# Patient Record
Sex: Male | Born: 1950 | Race: White | Hispanic: No | Marital: Married | Smoking: Former smoker
Health system: Southern US, Community
[De-identification: ages and names within clinical notes are randomized; demographics above are authoritative.]

## PROBLEM LIST (undated history)

## (undated) DIAGNOSIS — I499 Cardiac arrhythmia, unspecified: Secondary | ICD-10-CM

## (undated) DIAGNOSIS — D509 Iron deficiency anemia, unspecified: Secondary | ICD-10-CM

## (undated) DIAGNOSIS — R55 Syncope and collapse: Secondary | ICD-10-CM

## (undated) DIAGNOSIS — M1990 Unspecified osteoarthritis, unspecified site: Secondary | ICD-10-CM

## (undated) DIAGNOSIS — E119 Type 2 diabetes mellitus without complications: Secondary | ICD-10-CM

## (undated) DIAGNOSIS — I219 Acute myocardial infarction, unspecified: Secondary | ICD-10-CM

## (undated) DIAGNOSIS — K219 Gastro-esophageal reflux disease without esophagitis: Secondary | ICD-10-CM

## (undated) DIAGNOSIS — J449 Chronic obstructive pulmonary disease, unspecified: Secondary | ICD-10-CM

## (undated) DIAGNOSIS — I480 Paroxysmal atrial fibrillation: Secondary | ICD-10-CM

## (undated) DIAGNOSIS — H9190 Unspecified hearing loss, unspecified ear: Secondary | ICD-10-CM

## (undated) DIAGNOSIS — Z950 Presence of cardiac pacemaker: Secondary | ICD-10-CM

## (undated) DIAGNOSIS — F32A Depression, unspecified: Secondary | ICD-10-CM

## (undated) DIAGNOSIS — I493 Ventricular premature depolarization: Secondary | ICD-10-CM

## (undated) DIAGNOSIS — K5790 Diverticulosis of intestine, part unspecified, without perforation or abscess without bleeding: Secondary | ICD-10-CM

## (undated) DIAGNOSIS — I119 Hypertensive heart disease without heart failure: Secondary | ICD-10-CM

## (undated) DIAGNOSIS — R011 Cardiac murmur, unspecified: Secondary | ICD-10-CM

## (undated) DIAGNOSIS — I2510 Atherosclerotic heart disease of native coronary artery without angina pectoris: Secondary | ICD-10-CM

## (undated) DIAGNOSIS — T7840XA Allergy, unspecified, initial encounter: Secondary | ICD-10-CM

## (undated) DIAGNOSIS — G43909 Migraine, unspecified, not intractable, without status migrainosus: Secondary | ICD-10-CM

## (undated) DIAGNOSIS — E785 Hyperlipidemia, unspecified: Secondary | ICD-10-CM

## (undated) DIAGNOSIS — I10 Essential (primary) hypertension: Secondary | ICD-10-CM

## (undated) DIAGNOSIS — G4730 Sleep apnea, unspecified: Secondary | ICD-10-CM

## (undated) HISTORY — DX: Migraine, unspecified, not intractable, without status migrainosus: G43.909

## (undated) HISTORY — PX: SPINE SURGERY: SHX786

## (undated) HISTORY — DX: Type 2 diabetes mellitus without complications: E11.9

## (undated) HISTORY — DX: Cardiac arrhythmia, unspecified: I49.9

## (undated) HISTORY — DX: Hypertensive heart disease without heart failure: I11.9

## (undated) HISTORY — DX: Sleep apnea, unspecified: G47.30

## (undated) HISTORY — DX: Unspecified osteoarthritis, unspecified site: M19.90

## (undated) HISTORY — DX: Diverticulosis of intestine, part unspecified, without perforation or abscess without bleeding: K57.90

## (undated) HISTORY — PX: KNEE ARTHROSCOPY: SHX127

## (undated) HISTORY — DX: Atherosclerotic heart disease of native coronary artery without angina pectoris: I25.10

## (undated) HISTORY — PX: EYE SURGERY: SHX253

## (undated) HISTORY — DX: Allergy, unspecified, initial encounter: T78.40XA

## (undated) HISTORY — DX: Ventricular premature depolarization: I49.3

## (undated) HISTORY — DX: Hyperlipidemia, unspecified: E78.5

## (undated) HISTORY — DX: Depression, unspecified: F32.A

## (undated) HISTORY — DX: Chronic obstructive pulmonary disease, unspecified: J44.9

## (undated) HISTORY — PX: ABDOMINAL HYSTERECTOMY: SHX81

## (undated) HISTORY — PX: ROTATOR CUFF REPAIR: SHX139

## (undated) HISTORY — PX: LASIK: SHX215

## (undated) HISTORY — PX: WRIST ARTHROCENTESIS: SUR48

## (undated) HISTORY — PX: CORONARY ANGIOPLASTY WITH STENT PLACEMENT: SHX49

## (undated) HISTORY — DX: Essential (primary) hypertension: I10

## (undated) HISTORY — DX: Paroxysmal atrial fibrillation: I48.0

## (undated) HISTORY — DX: Iron deficiency anemia, unspecified: D50.9

## (undated) HISTORY — DX: Acute myocardial infarction, unspecified: I21.9

## (undated) HISTORY — PX: SHOULDER OPEN ROTATOR CUFF REPAIR: SHX2407

## (undated) HISTORY — DX: Gastro-esophageal reflux disease without esophagitis: K21.9

## (undated) HISTORY — PX: NECK SURGERY: SHX720

---

## 1998-06-23 ENCOUNTER — Encounter: Payer: Self-pay | Admitting: Neurosurgery

## 1998-06-25 ENCOUNTER — Encounter: Payer: Self-pay | Admitting: Neurosurgery

## 1998-06-25 ENCOUNTER — Inpatient Hospital Stay (HOSPITAL_COMMUNITY): Admission: RE | Admit: 1998-06-25 | Discharge: 1998-06-26 | Payer: Self-pay | Admitting: Neurosurgery

## 1999-06-01 HISTORY — PX: NASAL SINUS SURGERY: SHX719

## 1999-06-28 ENCOUNTER — Encounter: Payer: Self-pay | Admitting: Neurosurgery

## 1999-06-28 ENCOUNTER — Ambulatory Visit (HOSPITAL_COMMUNITY): Admission: RE | Admit: 1999-06-28 | Discharge: 1999-06-28 | Payer: Self-pay | Admitting: Neurosurgery

## 1999-12-03 ENCOUNTER — Encounter: Admission: RE | Admit: 1999-12-03 | Discharge: 2000-03-02 | Payer: Self-pay | Admitting: Family Medicine

## 2000-01-25 ENCOUNTER — Encounter: Payer: Self-pay | Admitting: Neurosurgery

## 2000-01-25 ENCOUNTER — Encounter: Admission: RE | Admit: 2000-01-25 | Discharge: 2000-01-25 | Payer: Self-pay | Admitting: Neurosurgery

## 2004-12-21 ENCOUNTER — Encounter: Admission: RE | Admit: 2004-12-21 | Discharge: 2004-12-21 | Payer: Self-pay | Admitting: *Deleted

## 2004-12-25 ENCOUNTER — Ambulatory Visit (HOSPITAL_COMMUNITY): Admission: RE | Admit: 2004-12-25 | Discharge: 2004-12-25 | Payer: Self-pay | Admitting: *Deleted

## 2006-02-28 HISTORY — PX: CARDIAC CATHETERIZATION: SHX172

## 2006-06-30 ENCOUNTER — Encounter (INDEPENDENT_AMBULATORY_CARE_PROVIDER_SITE_OTHER): Payer: Self-pay | Admitting: Specialist

## 2006-06-30 ENCOUNTER — Ambulatory Visit (HOSPITAL_BASED_OUTPATIENT_CLINIC_OR_DEPARTMENT_OTHER): Admission: RE | Admit: 2006-06-30 | Discharge: 2006-06-30 | Payer: Self-pay | Admitting: Orthopedic Surgery

## 2006-09-30 ENCOUNTER — Ambulatory Visit: Payer: Self-pay | Admitting: Pulmonary Disease

## 2006-11-11 ENCOUNTER — Ambulatory Visit (HOSPITAL_COMMUNITY): Admission: RE | Admit: 2006-11-11 | Discharge: 2006-11-11 | Payer: Self-pay | Admitting: *Deleted

## 2008-02-29 HISTORY — PX: CARDIAC CATHETERIZATION: SHX172

## 2008-03-04 ENCOUNTER — Observation Stay (HOSPITAL_COMMUNITY): Admission: EM | Admit: 2008-03-04 | Discharge: 2008-03-06 | Payer: Self-pay | Admitting: Emergency Medicine

## 2008-09-05 DIAGNOSIS — I4510 Unspecified right bundle-branch block: Secondary | ICD-10-CM

## 2008-09-05 DIAGNOSIS — I498 Other specified cardiac arrhythmias: Secondary | ICD-10-CM | POA: Insufficient documentation

## 2008-09-05 DIAGNOSIS — K219 Gastro-esophageal reflux disease without esophagitis: Secondary | ICD-10-CM | POA: Insufficient documentation

## 2008-09-05 DIAGNOSIS — Z87898 Personal history of other specified conditions: Secondary | ICD-10-CM

## 2008-09-05 DIAGNOSIS — J309 Allergic rhinitis, unspecified: Secondary | ICD-10-CM | POA: Insufficient documentation

## 2008-09-05 DIAGNOSIS — M549 Dorsalgia, unspecified: Secondary | ICD-10-CM | POA: Insufficient documentation

## 2008-09-05 DIAGNOSIS — Z8679 Personal history of other diseases of the circulatory system: Secondary | ICD-10-CM | POA: Insufficient documentation

## 2008-09-05 DIAGNOSIS — Z9861 Coronary angioplasty status: Secondary | ICD-10-CM

## 2008-09-05 DIAGNOSIS — E785 Hyperlipidemia, unspecified: Secondary | ICD-10-CM | POA: Insufficient documentation

## 2008-09-05 DIAGNOSIS — I2510 Atherosclerotic heart disease of native coronary artery without angina pectoris: Secondary | ICD-10-CM

## 2008-09-05 DIAGNOSIS — I10 Essential (primary) hypertension: Secondary | ICD-10-CM

## 2008-09-09 ENCOUNTER — Encounter: Payer: Self-pay | Admitting: Internal Medicine

## 2008-09-09 ENCOUNTER — Ambulatory Visit: Payer: Self-pay | Admitting: Internal Medicine

## 2008-09-19 ENCOUNTER — Telehealth (INDEPENDENT_AMBULATORY_CARE_PROVIDER_SITE_OTHER): Payer: Self-pay | Admitting: *Deleted

## 2008-09-28 HISTORY — PX: RADIOFREQUENCY ABLATION: SHX2290

## 2008-10-16 ENCOUNTER — Ambulatory Visit: Payer: Self-pay | Admitting: Internal Medicine

## 2008-10-16 DIAGNOSIS — I4891 Unspecified atrial fibrillation: Secondary | ICD-10-CM | POA: Insufficient documentation

## 2008-10-16 LAB — CONVERTED CEMR LAB
BUN: 15 mg/dL (ref 6–23)
Basophils Absolute: 0 10*3/uL (ref 0.0–0.1)
Basophils Relative: 0 % (ref 0.0–3.0)
CO2: 26 meq/L (ref 19–32)
Calcium: 9 mg/dL (ref 8.4–10.5)
Chloride: 105 meq/L (ref 96–112)
Creatinine, Ser: 1 mg/dL (ref 0.4–1.5)
Eosinophils Absolute: 0.1 10*3/uL (ref 0.0–0.7)
Eosinophils Relative: 1.7 % (ref 0.0–5.0)
GFR calc non Af Amer: 81.36 mL/min (ref >60)
Glucose, Bld: 85 mg/dL (ref 70–99)
HCT: 42.4 % (ref 39.0–52.0)
Hemoglobin: 14.7 g/dL (ref 13.0–17.0)
Lymphocytes Relative: 33.3 % (ref 12.0–46.0)
Lymphs Abs: 2.4 10*3/uL (ref 0.7–4.0)
MCHC: 34.7 g/dL (ref 30.0–36.0)
MCV: 86.4 fL (ref 78.0–100.0)
Monocytes Absolute: 0.5 10*3/uL (ref 0.1–1.0)
Monocytes Relative: 7.6 % (ref 3.0–12.0)
Neutro Abs: 4.1 10*3/uL (ref 1.4–7.7)
Neutrophils Relative %: 57.4 % (ref 43.0–77.0)
Platelets: 243 10*3/uL (ref 150.0–400.0)
Potassium: 4 meq/L (ref 3.5–5.1)
RBC: 4.9 M/uL (ref 4.22–5.81)
RDW: 11.9 % (ref 11.5–14.6)
Sodium: 137 meq/L (ref 135–145)
WBC: 7.1 10*3/uL (ref 4.5–10.5)

## 2008-10-17 LAB — CONVERTED CEMR LAB
INR: 2.5 — ABNORMAL HIGH (ref 0.8–1.0)
Prothrombin Time: 26 s — ABNORMAL HIGH (ref 10.9–13.3)
aPTT: 39.5 s — ABNORMAL HIGH (ref 21.7–28.8)

## 2008-10-18 ENCOUNTER — Encounter: Payer: Self-pay | Admitting: Internal Medicine

## 2008-10-18 ENCOUNTER — Telehealth: Payer: Self-pay | Admitting: Internal Medicine

## 2008-10-22 ENCOUNTER — Observation Stay (HOSPITAL_COMMUNITY): Admission: AD | Admit: 2008-10-22 | Discharge: 2008-10-23 | Payer: Self-pay | Admitting: Internal Medicine

## 2008-10-22 ENCOUNTER — Encounter (INDEPENDENT_AMBULATORY_CARE_PROVIDER_SITE_OTHER): Payer: Self-pay | Admitting: Cardiology

## 2008-10-22 ENCOUNTER — Ambulatory Visit: Payer: Self-pay | Admitting: Internal Medicine

## 2008-10-30 ENCOUNTER — Telehealth: Payer: Self-pay | Admitting: Internal Medicine

## 2009-01-22 ENCOUNTER — Ambulatory Visit: Payer: Self-pay | Admitting: Internal Medicine

## 2009-03-24 ENCOUNTER — Encounter: Admission: RE | Admit: 2009-03-24 | Discharge: 2009-03-24 | Payer: Self-pay | Admitting: Cardiology

## 2009-06-02 ENCOUNTER — Ambulatory Visit: Payer: Self-pay | Admitting: Internal Medicine

## 2009-06-02 ENCOUNTER — Encounter: Payer: Self-pay | Admitting: Internal Medicine

## 2009-06-02 DIAGNOSIS — R002 Palpitations: Secondary | ICD-10-CM | POA: Insufficient documentation

## 2009-06-04 ENCOUNTER — Ambulatory Visit: Payer: Self-pay | Admitting: Internal Medicine

## 2009-07-28 ENCOUNTER — Ambulatory Visit: Payer: Self-pay | Admitting: Internal Medicine

## 2009-08-14 ENCOUNTER — Ambulatory Visit (HOSPITAL_BASED_OUTPATIENT_CLINIC_OR_DEPARTMENT_OTHER): Admission: RE | Admit: 2009-08-14 | Discharge: 2009-08-15 | Payer: Self-pay | Admitting: Orthopedic Surgery

## 2010-01-26 ENCOUNTER — Ambulatory Visit: Payer: Self-pay | Admitting: Internal Medicine

## 2010-05-31 HISTORY — PX: CARDIAC CATHETERIZATION: SHX172

## 2010-06-04 ENCOUNTER — Inpatient Hospital Stay (HOSPITAL_COMMUNITY)
Admission: EM | Admit: 2010-06-04 | Discharge: 2010-06-09 | Payer: Self-pay | Source: Home / Self Care | Attending: Cardiovascular Disease | Admitting: Cardiovascular Disease

## 2010-06-04 LAB — BASIC METABOLIC PANEL
BUN: 11 mg/dL (ref 6–23)
CO2: 26 mEq/L (ref 19–32)
Calcium: 9.6 mg/dL (ref 8.4–10.5)
Chloride: 108 mEq/L (ref 96–112)
Creatinine, Ser: 0.93 mg/dL (ref 0.4–1.5)
GFR calc Af Amer: >60 mL/min (ref >60)
GFR calc non Af Amer: >60 mL/min (ref >60)
Glucose, Bld: 128 mg/dL — ABNORMAL HIGH (ref 70–99)
Potassium: 4.1 mEq/L (ref 3.5–5.1)
Sodium: 141 mEq/L (ref 135–145)

## 2010-06-04 LAB — DIFFERENTIAL
Basophils Absolute: 0 10*3/uL (ref 0.0–0.1)
Basophils Relative: 0 % (ref 0–1)
Eosinophils Absolute: 0 10*3/uL (ref 0.0–0.7)
Eosinophils Relative: 0 % (ref 0–5)
Lymphocytes Relative: 11 % — ABNORMAL LOW (ref 12–46)
Lymphs Abs: 1.5 10*3/uL (ref 0.7–4.0)
Monocytes Absolute: 0.7 10*3/uL (ref 0.1–1.0)
Monocytes Relative: 5 % (ref 3–12)
Neutro Abs: 11.8 10*3/uL — ABNORMAL HIGH (ref 1.7–7.7)
Neutrophils Relative %: 84 % — ABNORMAL HIGH (ref 43–77)

## 2010-06-04 LAB — POCT CARDIAC MARKERS
CKMB, poc: 1.5 ng/mL (ref 1.0–8.0)
CKMB, poc: 2.1 ng/mL (ref 1.0–8.0)
Myoglobin, poc: 68.7 ng/mL (ref 12–200)
Myoglobin, poc: 80.2 ng/mL (ref 12–200)
Troponin i, poc: <0.05 ng/mL (ref 0.00–0.09)
Troponin i, poc: <0.05 ng/mL (ref 0.00–0.09)

## 2010-06-04 LAB — CK TOTAL AND CKMB (NOT AT ARMC)
CK, MB: 3.3 ng/mL (ref 0.3–4.0)
Relative Index: INVALID (ref 0.0–2.5)
Total CK: 89 U/L (ref 7–232)

## 2010-06-04 LAB — CBC
HCT: 44.5 % (ref 39.0–52.0)
Hemoglobin: 14.7 g/dL (ref 13.0–17.0)
MCH: 29.2 pg (ref 26.0–34.0)
MCHC: 33 g/dL (ref 30.0–36.0)
MCV: 88.3 fL (ref 78.0–100.0)
Platelets: 271 10*3/uL (ref 150–400)
RBC: 5.04 MIL/uL (ref 4.22–5.81)
RDW: 12.7 % (ref 11.5–15.5)
WBC: 14 10*3/uL — ABNORMAL HIGH (ref 4.0–10.5)

## 2010-06-04 LAB — PROTIME-INR
INR: 1.06 (ref 0.00–1.49)
Prothrombin Time: 14 seconds (ref 11.6–15.2)

## 2010-06-04 LAB — TROPONIN I: Troponin I: 0.02 ng/mL (ref 0.00–0.06)

## 2010-06-05 HISTORY — PX: CARDIAC CATHETERIZATION: SHX172

## 2010-06-05 LAB — LIPID PANEL
Cholesterol: 132 mg/dL (ref 0–200)
HDL: 41 mg/dL (ref >39)
LDL Cholesterol: 83 mg/dL (ref 0–99)
Total CHOL/HDL Ratio: 3.2 RATIO
Triglycerides: 39 mg/dL (ref <150)
VLDL: 8 mg/dL (ref 0–40)

## 2010-06-05 LAB — CARDIAC PANEL(CRET KIN+CKTOT+MB+TROPI)
CK, MB: 3.3 ng/mL (ref 0.3–4.0)
CK, MB: 2.4 ng/mL (ref 0.3–4.0)
Relative Index: INVALID (ref 0.0–2.5)
Relative Index: INVALID (ref 0.0–2.5)
Total CK: 89 U/L (ref 7–232)
Total CK: 80 U/L (ref 7–232)
Troponin I: 0.02 ng/mL (ref 0.00–0.06)
Troponin I: 0.01 ng/mL (ref 0.00–0.06)

## 2010-06-05 LAB — CBC
HCT: 41.3 % (ref 39.0–52.0)
Hemoglobin: 13.6 g/dL (ref 13.0–17.0)
MCH: 29.1 pg (ref 26.0–34.0)
MCHC: 32.9 g/dL (ref 30.0–36.0)
MCV: 88.2 fL (ref 78.0–100.0)
Platelets: 207 10*3/uL (ref 150–400)
RBC: 4.68 MIL/uL (ref 4.22–5.81)
RDW: 13 % (ref 11.5–15.5)
WBC: 9.8 10*3/uL (ref 4.0–10.5)

## 2010-06-05 LAB — TSH: TSH: 0.329 u[IU]/mL — ABNORMAL LOW (ref 0.350–4.500)

## 2010-06-05 LAB — BASIC METABOLIC PANEL
BUN: 13 mg/dL (ref 6–23)
CO2: 24 mEq/L (ref 19–32)
Calcium: 8.7 mg/dL (ref 8.4–10.5)
Chloride: 106 mEq/L (ref 96–112)
Creatinine, Ser: 0.91 mg/dL (ref 0.4–1.5)
GFR calc Af Amer: >60 mL/min (ref >60)
GFR calc non Af Amer: >60 mL/min (ref >60)
Glucose, Bld: 85 mg/dL (ref 70–99)
Potassium: 3.8 mEq/L (ref 3.5–5.1)
Sodium: 139 mEq/L (ref 135–145)

## 2010-06-05 LAB — PROTIME-INR
INR: 1.11 (ref 0.00–1.49)
Prothrombin Time: 14.5 seconds (ref 11.6–15.2)

## 2010-06-05 LAB — HEMOGLOBIN A1C
Hgb A1c MFr Bld: 6.1 % — ABNORMAL HIGH (ref <5.7)
Mean Plasma Glucose: 128 mg/dL — ABNORMAL HIGH (ref <117)

## 2010-06-05 LAB — D-DIMER, QUANTITATIVE: D-Dimer, Quant: <0.22 ug/mL-FEU (ref 0.00–0.48)

## 2010-06-05 LAB — BRAIN NATRIURETIC PEPTIDE: Pro B Natriuretic peptide (BNP): 36 pg/mL (ref 0.0–100.0)

## 2010-06-05 LAB — HEPARIN LEVEL (UNFRACTIONATED): Heparin Unfractionated: <0.1 IU/mL — ABNORMAL LOW (ref 0.30–0.70)

## 2010-06-08 ENCOUNTER — Encounter: Payer: Self-pay | Admitting: Gastroenterology

## 2010-06-15 LAB — BASIC METABOLIC PANEL
BUN: 14 mg/dL (ref 6–23)
BUN: 15 mg/dL (ref 6–23)
BUN: 12 mg/dL (ref 6–23)
BUN: 15 mg/dL (ref 6–23)
CO2: 27 mEq/L (ref 19–32)
CO2: 29 mEq/L (ref 19–32)
CO2: 29 mEq/L (ref 19–32)
CO2: 27 mEq/L (ref 19–32)
Calcium: 8.5 mg/dL (ref 8.4–10.5)
Calcium: 8.9 mg/dL (ref 8.4–10.5)
Calcium: 9 mg/dL (ref 8.4–10.5)
Calcium: 8.6 mg/dL (ref 8.4–10.5)
Chloride: 104 mEq/L (ref 96–112)
Chloride: 102 mEq/L (ref 96–112)
Chloride: 104 mEq/L (ref 96–112)
Chloride: 107 mEq/L (ref 96–112)
Creatinine, Ser: 1.03 mg/dL (ref 0.4–1.5)
Creatinine, Ser: 0.95 mg/dL (ref 0.4–1.5)
Creatinine, Ser: 0.96 mg/dL (ref 0.4–1.5)
Creatinine, Ser: 0.96 mg/dL (ref 0.4–1.5)
GFR calc Af Amer: >60 mL/min (ref >60)
GFR calc Af Amer: >60 mL/min (ref >60)
GFR calc Af Amer: >60 mL/min (ref >60)
GFR calc Af Amer: >60 mL/min (ref >60)
GFR calc non Af Amer: >60 mL/min (ref >60)
GFR calc non Af Amer: >60 mL/min (ref >60)
GFR calc non Af Amer: >60 mL/min (ref >60)
GFR calc non Af Amer: >60 mL/min (ref >60)
Glucose, Bld: 87 mg/dL (ref 70–99)
Glucose, Bld: 93 mg/dL (ref 70–99)
Glucose, Bld: 93 mg/dL (ref 70–99)
Glucose, Bld: 93 mg/dL (ref 70–99)
Potassium: 3.9 mEq/L (ref 3.5–5.1)
Potassium: 3.8 mEq/L (ref 3.5–5.1)
Potassium: 3.9 mEq/L (ref 3.5–5.1)
Potassium: 4.1 mEq/L (ref 3.5–5.1)
Sodium: 138 mEq/L (ref 135–145)
Sodium: 139 mEq/L (ref 135–145)
Sodium: 139 mEq/L (ref 135–145)
Sodium: 138 mEq/L (ref 135–145)

## 2010-06-15 LAB — CBC
HCT: 34.6 % — ABNORMAL LOW (ref 39.0–52.0)
HCT: 38.2 % — ABNORMAL LOW (ref 39.0–52.0)
HCT: 37.6 % — ABNORMAL LOW (ref 39.0–52.0)
Hemoglobin: 11.3 g/dL — ABNORMAL LOW (ref 13.0–17.0)
Hemoglobin: 12.8 g/dL — ABNORMAL LOW (ref 13.0–17.0)
Hemoglobin: 12.2 g/dL — ABNORMAL LOW (ref 13.0–17.0)
MCH: 29.2 pg (ref 26.0–34.0)
MCH: 29.3 pg (ref 26.0–34.0)
MCH: 28.6 pg (ref 26.0–34.0)
MCHC: 32.7 g/dL (ref 30.0–36.0)
MCHC: 33.5 g/dL (ref 30.0–36.0)
MCHC: 32.4 g/dL (ref 30.0–36.0)
MCV: 89.4 fL (ref 78.0–100.0)
MCV: 87.4 fL (ref 78.0–100.0)
MCV: 88.1 fL (ref 78.0–100.0)
Platelets: 197 10*3/uL (ref 150–400)
Platelets: 212 10*3/uL (ref 150–400)
Platelets: 193 10*3/uL (ref 150–400)
RBC: 3.87 MIL/uL — ABNORMAL LOW (ref 4.22–5.81)
RBC: 4.37 MIL/uL (ref 4.22–5.81)
RBC: 4.27 MIL/uL (ref 4.22–5.81)
RDW: 13.1 % (ref 11.5–15.5)
RDW: 12.5 % (ref 11.5–15.5)
RDW: 12.7 % (ref 11.5–15.5)
WBC: 7.6 10*3/uL (ref 4.0–10.5)
WBC: 7.9 10*3/uL (ref 4.0–10.5)
WBC: 7.4 10*3/uL (ref 4.0–10.5)

## 2010-06-15 LAB — PROTIME-INR
INR: 1.07 (ref 0.00–1.49)
Prothrombin Time: 14.1 seconds (ref 11.6–15.2)

## 2010-06-18 NOTE — Discharge Summary (Signed)
NAME:  Lee [name], Lee [name] NO.:  [id number]  MEDICAL RECORD NO.:  [id number]          PATIENT TYPE:  INP  LOCATION:  6525                         FACILITY:  [facility]  PHYSICIAN:  Thereasa Solo. Little, M.D. DATE OF BIRTH:  04-20-1951  DATE OF ADMISSION:  06/04/2010 DATE OF DISCHARGE:  06/09/2010                              DISCHARGE SUMMARY   DISCHARGE DIAGNOSES: 1. Unstable angina, status post right coronary artery drug-eluting     stent this admission, followed by staged proximal left anterior     descending drug-eluting stent. 2. Good left ventricular function. 3. Paroxysmal atrial fibrillation with history of tachycardia on     admission without recurrence after hospitalization. 4. Treated hypertension. 5. Untreated dyslipidemia.  HOSPITAL COURSE:  The patient is a 61 year old male who is followed by Dr. Clarene Duke.  He has a history of PAF and SVT and coronary artery disease.  He had had a prior LAD stent in October 2009.  He underwent radiofrequency ablation for his atrial fibrillation in May 2010 by Dr. Johney Frame.  On the day of admission, he was sitting on a couch reading and he developed tachycardia, substernal chest pain, and shortness of breath.  He presented to the emergency room.  There is a report of tachycardia at 140, but I could not find any strips in the chart.  On admission, his EKG shows sinus tachycardia at 100.  He has a chronic right bundle.  He was admitted to telemetry and started on heparin and nitrates.  He ruled out for an MI by enzymes.  He had had a diagnostic catheterization on June 05, 2010, that showed a 90% proximal RCA.  He also had a 70% proximal LAD just after the takeoff of the first diagonal.  Previous mid LAD stent was patent.  Circumflex and left main were patent.  LV function was normal.  Renal and iliacs were normal.  He underwent staged intervention to the LAD on June 08, 2010, with a Promus stent.  We feel he can be discharged  on June 09, 2010.  He has not had recurrent tachycardia.  During this hospitalization, he did have some problems with migraines, he did actually have a migraine and was put on steroids as an outpatient prior to his admission.  These were discontinued during his hospitalization.  We did add low-dose Bystolic, the patient does have a history of erectile dysfunction.  LABORATORY DATA:  Troponins were negative.  CK-MB were negative.  BNP was 36.  Cholesterol was 132, HDL 41, and LDL 83.  Hemoglobin A1c was 6.1.  INR 1.06.  Sodium 138, potassium 4.1, BUN 15, and creatinine 0.96. White count 7.4, hemoglobin 12.2, and hematocrit 37.6.  D-dimer was less than 0.22.  Chest x-ray shows no infiltrate, congestive failure, or pneumothorax.  EKG shows sinus rhythm with small Q-wave in III and Q- wave in aVF and right bundle-branch block.  Please see med rec for complete discharge medications.  DISPOSITION:  The patient is discharged in stable condition and will follow up with Dr. Clarene Duke in a couple of weeks in the office.     Abelino Derrick, P.A.  ______________________________ Thereasa Solo Little, M.D.    Lee [name]  D:  06/09/2010  T:  06/10/2010  Job:  [id number]  cc:   Hillis Range, MD  Electronically Signed by Corine Shelter P.A. on 06/10/2010 10:10:09 AM Electronically Signed by Julieanne Manson M.D. on 06/18/2010 02:32:04 PM

## 2010-06-30 NOTE — Assessment & Plan Note (Signed)
Summary: 4 month rov/sl   Allergies: 1)  ! * Toprol   Other Orders: Holter Monitor (Holter Monitor)

## 2010-06-30 NOTE — Assessment & Plan Note (Signed)
Summary: PER CHECKOUT   Referring Provider:  Caprice Kluver, MD Primary Provider:  Erlinda Hong   History of Present Illness: The patient presents today for routine electrophysiology followup. He reports doing very well since last being seen in our clinic.  He has rare palpitations lasting only several seconds at a time.  A recent event monitor confirmed these palpitations to be PVCs and NSVT.  He has had no further afib. The patient denies symptoms of  chest pain, shortness of breath, orthopnea, PND, lower extremity edema, dizziness, presyncope, syncope, or neurologic sequela. The patient is tolerating medications without difficulties and is otherwise without complaint today.   He remains quite active.  He has been doing yard work all summer without symptoms of CP, SOB, or other.  Current Medications (verified): 1)  Micardis 40 Mg Tabs (Telmisartan) .... Take One Tablet By Mouth Daily 2)  Crestor 10 Mg Tabs (Rosuvastatin Calcium) .... Take One Tablet By Mouth Daily. 3)  Aspirin Ec 325 Mg Tbec (Aspirin) .... Take One Tablet By Mouth Daily 4)  Nasonex 50 Mcg/act Susp (Mometasone Furoate) .... 2 Sprays Each Nostril As Needed 5)  Nitroglycerin 0.4 Mg Subl (Nitroglycerin) .... One Tablet Under Tongue Every 5 Minutes As Needed For Chest Pain---May Repeat Times Three 6)  Nexium 40 Mg Cpdr (Esomeprazole Magnesium) .Marland Kitchen.. 1 By Mouth Once Daily 7)  Potassium Chloride Cr 10 Meq Cr-Caps (Potassium Chloride) .... Take One Tablet By Mouth Daily 8)  Furosemide 40 Mg Tabs (Furosemide) .... Take One Tablet By Mouth Daily.  Allergies (verified): 1)  ! * Toprol  Past History:  Past Medical History: Reviewed history from 07/28/2009 and no changes required. Paroxysmal atrial fibrillation s/p ablation PVCs CAD s/p PCI  of LAD with DES 10/09 HTN HL DJD/DDD GERD Migraines Allergic rhinitis  Past Surgical History: Reviewed history from 09/09/2008 and no changes required.  History of spine  surgery History of sinus surgery in 2001  Reconstruction of left thumb carpometacarpal joint 06/30/2006  Insertion and subsequent removal of a Medtronic Reveal recorder 2008   Darlin Priestly, M.D.   Social History: Reviewed history from 06/02/2009 and no changes required. The patient lives in [place] with his spouse. 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 He has a history of tobacco use but quit in 1989. His remote history of alcohol use and denies drug use.  Review of Systems       All systems are reviewed and negative except as listed in the HPI.   Vital Signs:  Patient profile:   60 year old male Height:      68 inches Weight:      245 pounds BMI:     37.39 Pulse rate:   86 / minute Resp:     18 per minute BP sitting:   122 / 72  (left arm)  Vitals Entered By: Marrion Coy, CNA (January 26, 2010 4:30 PM)  Physical Exam  General:  Well developed, well nourished, in no acute distress. Head:  normocephalic and atraumatic Eyes:  PERRLA/EOM intact; conjunctiva and lids normal. Mouth:  Teeth, gums and palate normal. Oral mucosa normal. Neck:  Neck supple, no JVD. No masses, thyromegaly or abnormal cervical nodes. Lungs:  Clear bilaterally to auscultation and percussion. Heart:  Non-displaced PMI, chest non-tender; regular rate and rhythm, S1, S2 without murmurs, rubs or gallops. Carotid upstroke normal, no bruit. Normal abdominal aortic size, no bruits. Femorals normal pulses, no bruits. Pedals normal  pulses. No edema, no varicosities. Abdomen:  Bowel sounds positive; abdomen soft and non-tender without masses, organomegaly, or hernias noted. No hepatosplenomegaly. Msk:  Back normal, normal gait. Muscle strength and tone normal. Pulses:  pulses normal in all 4 extremities Extremities:  No clubbing or cyanosis. Neurologic:  Alert and oriented x 3. Skin:  Intact without lesions or rashes. Psych:  Normal  affect.   EKG  Procedure date:  01/26/2010  Findings:      sinus rhythm 86 bpm, RBBB, inferior infarction  Impression & Recommendations:  Problem # 1:  ATRIAL FIBRILLATION (ICD-427.31) No further afib s/'p ablation Continue ASA 325mg  daily longterm  Problem # 2:  PALPITATIONS (ICD-785.1) due to PVCs and NSVT. Given their infrequent nature (<1 time per week, lasting 1-2 seconds), I would not recommend medical therapy at this time.  He has not tolerated beta blockers in the past. Pt should have TTE upon follow-up with Dr Clarene Duke to ensure preserved EF.  Problem # 3:  RBBB (ICD-426.4) stable  Other Orders: EKG w/ Interpretation (93000)  Patient Instructions: 1)  Your physician wants you to follow-up in: May in 2012.  You will receive a reminder letter in the mail two months in advance. If you don't receive a letter, please call our office to schedule the follow-up appointment. 2)  Your physician recommends that you continue on your current medications as directed. Please refer to the Current Medication list given to you today.

## 2010-06-30 NOTE — Assessment & Plan Note (Signed)
Summary: 6 WKS/D.MILLER   Visit Type:  Follow-up Referring Provider:  Caprice Kluver, MD Primary Provider:  Erlinda Hong   History of Present Illness: The patient presents today for routine electrophysiology followup. He reports doing very well since last being seen in our clinic.  He has rare palpitations lasting only several seconds at a time.  A recent event monitor confirmed these palpitations to be PVCs and NSVT.  He has had no further afib. The patient denies symptoms of  chest pain, shortness of breath, orthopnea, PND, lower extremity edema, dizziness, presyncope, syncope, or neurologic sequela. The patient is tolerating medications without difficulties and is otherwise without complaint today.   Current Medications (verified): 1)  Micardis 40 Mg Tabs (Telmisartan) .... Take One Tablet By Mouth Daily 2)  Crestor 10 Mg Tabs (Rosuvastatin Calcium) .... Take One Tablet By Mouth Daily. 3)  Aspirin Ec 325 Mg Tbec (Aspirin) .... Take One Tablet By Mouth Daily 4)  Fexofenadine Hcl 180 Mg Tabs (Fexofenadine Hcl) .Marland Kitchen.. 1 By Mouth Once Daily 5)  Nasonex 50 Mcg/act Susp (Mometasone Furoate) .... 2 Sprays Each Nostril As Needed 6)  Plavix 75 Mg Tabs (Clopidogrel Bisulfate) .... Take One Tablet By Mouth Daily 7)  Nitroglycerin 0.4 Mg Subl (Nitroglycerin) .... One Tablet Under Tongue Every 5 Minutes As Needed For Chest Pain---May Repeat Times Three 8)  Nexium 40 Mg Cpdr (Esomeprazole Magnesium) .Marland Kitchen.. 1 By Mouth Once Daily 9)  Potassium Chloride Cr 10 Meq Cr-Caps (Potassium Chloride) .... Take One Tablet By Mouth Daily 10)  Furosemide 40 Mg Tabs (Furosemide) .... Take One Tablet By Mouth Daily.  Allergies: 1)  ! * Toprol  Past History:  Past Medical History: Paroxysmal atrial fibrillation s/p ablation PVCs CAD s/p PCI  of LAD with DES 10/09 HTN HL DJD/DDD GERD Migraines Allergic rhinitis  Past Surgical History: Reviewed history from 09/09/2008 and no changes required.  History of spine  surgery History of sinus surgery in 2001  Reconstruction of left thumb carpometacarpal joint 06/30/2006  Insertion and subsequent removal of a Medtronic Reveal recorder 2008   Darlin Priestly, M.D.   Social History: Reviewed history from 06/02/2009 and no changes required. The patient lives in [place] with his spouse. 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 He has a history of tobacco use but quit in 1989. His remote history of alcohol use and denies drug use.  Review of Systems       All systems are reviewed and negative except as listed in the HPI.   Vital Signs:  Patient profile:   60 year old male Height:      68 inches Weight:      264 pounds BMI:     40.29 Pulse rate:   87 / minute BP sitting:   110 / 80  Vitals Entered By: Laurance Flatten CMA (July 28, 2009 11:57 AM)  Physical Exam  General:  Well developed, well nourished, in no acute distress. Head:  normocephalic and atraumatic Eyes:  PERRLA/EOM intact; conjunctiva and lids normal. Mouth:  Teeth, gums and palate normal. Oral mucosa normal. Neck:  Neck supple, no JVD. No masses, thyromegaly or abnormal cervical nodes. Lungs:  Clear bilaterally to auscultation and percussion. Heart:  Non-displaced PMI, chest non-tender; regular rate and rhythm, S1, S2 without murmurs, rubs or gallops. Carotid upstroke normal, no bruit. Normal abdominal aortic size, no bruits. Femorals normal pulses, no bruits. Pedals normal pulses. No edema, no varicosities. Abdomen:  Bowel sounds positive; abdomen soft and non-tender without masses, organomegaly, or hernias noted. No hepatosplenomegaly. Msk:  Back normal, normal gait. Muscle strength and tone normal. Pulses:  pulses normal in all 4 extremities Extremities:  No clubbing or cyanosis. Neurologic:  Alert and oriented x 3.  CNII-XII intact, strength/ sensation are intact Skin:  Intact without lesions or rashes. Cervical Nodes:  no  significant adenopathy Psych:  Normal affect.   Impression & Recommendations:  Problem # 1:  PALPITATIONS (ICD-785.1) A recent event monitor documented that his palpitations are due to PVCs and NSVT.  He has a preserved EF and his PVCs appear benign.. I have offered beta blockers, however, the patient declines beta blocker therapy at this time as he has had intolerance to toprol, atenolol, and coreg previously. NO medication changes at this time.  Problem # 2:  ATRIAL FIBRILLATION (ICD-427.31) No further afib s/'p ablation  Problem # 3:  HYPERTENSION (ICD-401.9) stable  His updated medication list for this problem includes:    Micardis 40 Mg Tabs (Telmisartan) .Marland Kitchen... Take one tablet by mouth daily    Aspirin Ec 325 Mg Tbec (Aspirin) .Marland Kitchen... Take one tablet by mouth daily    Furosemide 40 Mg Tabs (Furosemide) .Marland Kitchen... Take one tablet by mouth daily.  Patient Instructions: 1)  Your physician recommends that you schedule a follow-up appointment in: 6 months with Dr Johney Frame 2)  Continue regular follow-up with Dr Clarene Duke.

## 2010-06-30 NOTE — Assessment & Plan Note (Signed)
Summary: [facility]   Visit Type:  Follow-up Referring Provider:  Caprice Kluver, MD Primary Provider:  Erlinda Hong   History of Present Illness: The patient presents today for routine electrophysiology followup. He reports doing very well since last being seen in our clinic. He continues to have only brief (1-2) seconds of palpitations every few days.  Even though they are brief, he finds these bothersome.  He otherwise appears to be doing very well.  The patient denies symptoms of chest pain, shortness of breath, orthopnea, PND, lower extremity edema, dizziness, presyncope, syncope, or neurologic sequela. The patient is tolerating medications without difficulties and is otherwise without complaint today.   Current Medications (verified): 1)  Micardis 20 Mg Tabs (Telmisartan) .... Take One Tablet By Mouth Daily 2)  Crestor 10 Mg Tabs (Rosuvastatin Calcium) .... Take One Tablet By Mouth Daily. 3)  Aspirin Ec 325 Mg Tbec (Aspirin) .... Take One Tablet By Mouth Daily 4)  Fexofenadine Hcl 180 Mg Tabs (Fexofenadine Hcl) .Marland Kitchen.. 1 By Mouth Once Daily 5)  Nasonex 50 Mcg/act Susp (Mometasone Furoate) .... 2 Sprays Each Nostril As Needed 6)  Plavix 75 Mg Tabs (Clopidogrel Bisulfate) .... Take One Tablet By Mouth Daily 7)  Nitroglycerin 0.4 Mg Subl (Nitroglycerin) .... One Tablet Under Tongue Every 5 Minutes As Needed For Chest Pain---May Repeat Times Three 8)  Nexium 40 Mg Cpdr (Esomeprazole Magnesium) .Marland Kitchen.. 1 By Mouth Once Daily 9)  Potassium Chloride Cr 10 Meq Cr-Caps (Potassium Chloride) .... Take One Tablet By Mouth Daily 10)  Furosemide 40 Mg Tabs (Furosemide) .... Take One Tablet By Mouth Daily.  Allergies: 1)  ! * Toprol  Past History:  Past Medical History: Reviewed history from 01/22/2009 and no changes required. Paroxysmal atrial fibrillation s/p ablation SVT CAD s/p PCI  of LAD with DES 10/09 HTN HL DJD/DDD GERD Migraines Allergic rhinitis  Past Surgical  History: Reviewed history from 09/09/2008 and no changes required.  History of spine surgery History of sinus surgery in 2001  Reconstruction of left thumb carpometacarpal joint 06/30/2006  Insertion and subsequent removal of a Medtronic Reveal recorder 2008   Darlin Priestly, M.D.   Social History: The patient lives in [place] with his spouse. 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 He has a history of tobacco use but quit in 1989. His remote history of alcohol use and denies drug use.  Review of Systems       All systems are reviewed and negative except as listed in the HPI.   Vital Signs:  Patient profile:   60 year old male Height:      68 inches Weight:      262 pounds BMI:     39.98 Pulse rate:   88 / minute BP sitting:   120 / 90  (left arm)  Vitals Entered By: Laurance Flatten CMA (June 02, 2009 12:35 PM)  Physical Exam  General:  Well developed, well nourished, in no acute distress. Head:  normocephalic and atraumatic Eyes:  PERRLA/EOM intact; conjunctiva and lids normal. Nose:  no deformity, discharge, inflammation, or lesions Mouth:  Teeth, gums and palate normal. Oral mucosa normal. Neck:  Neck supple, no JVD. No masses, thyromegaly or abnormal cervical nodes. Lungs:  Clear bilaterally to auscultation and percussion. Heart:  Non-displaced PMI, chest non-tender; regular rate and rhythm, S1, S2 without murmurs, rubs or gallops. Carotid upstroke normal, no bruit. Normal abdominal aortic size, no bruits. Femorals normal  pulses, no bruits. Pedals normal pulses. No edema, no varicosities. Abdomen:  Bowel sounds positive; abdomen soft and non-tender without masses, organomegaly, or hernias noted. No hepatosplenomegaly. Msk:  Back normal, normal gait. Muscle strength and tone normal. Pulses:  pulses normal in all 4 extremities Extremities:  No clubbing or cyanosis. Neurologic:  Alert and oriented x 3.  CNII-XII  intact, strength/ sensation are intact Skin:  Intact without lesions or rashes. Cervical Nodes:  no significant adenopathy Psych:  Normal affect.   EKG  Procedure date:  06/02/2009  Findings:      sinus rhythm 88 bpm, RBBB, otherwise normal ekg  Impression & Recommendations:  Problem # 1:  PALPITATIONS (ICD-785.1) The patient reports doing well, though he continues to have very brief (1-2 seconds) of palpitations. These may represent  PACS, PVCs, or other. At this point they are concerning to him. I have therefore ordered a 21 day event monitor. I will not make any medicine changes until I know what these palpitations are, though he may benefit from nadolol (has not tolerated toprol before due to irritability).  The following medications were removed from the medication list:    Tikosyn 125 Mcg Caps (Dofetilide) .Marland Kitchen... Take 3 capsule by mouth twice a day    Warfarin Sodium 7.5 Mg Tabs (Warfarin sodium) ..... Use as directed by anticoagulation clinic His updated medication list for this problem includes:    Aspirin Ec 325 Mg Tbec (Aspirin) .Marland Kitchen... Take one tablet by mouth daily    Plavix 75 Mg Tabs (Clopidogrel bisulfate) .Marland Kitchen... Take one tablet by mouth daily    Nitroglycerin 0.4 Mg Subl (Nitroglycerin) ..... One tablet under tongue every 5 minutes as needed for chest pain---may repeat times three  Problem # 2:  ATRIAL FIBRILLATION (ICD-427.31) NO sustained afib.  Continue ASA and plavix.  The following medications were removed from the medication list:    Tikosyn 125 Mcg Caps (Dofetilide) .Marland Kitchen... Take 3 capsule by mouth twice a day    Warfarin Sodium 7.5 Mg Tabs (Warfarin sodium) ..... Use as directed by anticoagulation clinic His updated medication list for this problem includes:    Aspirin Ec 325 Mg Tbec (Aspirin) .Marland Kitchen... Take one tablet by mouth daily    Plavix 75 Mg Tabs (Clopidogrel bisulfate) .Marland Kitchen... Take one tablet by mouth daily  Orders: EKG w/ Interpretation (93000)  Problem  # 3:  CAD (ICD-414.00) stable, without symptoms of ischemia, no changes  The following medications were removed from the medication list:    Warfarin Sodium 7.5 Mg Tabs (Warfarin sodium) ..... Use as directed by anticoagulation clinic His updated medication list for this problem includes:    Aspirin Ec 325 Mg Tbec (Aspirin) .Marland Kitchen... Take one tablet by mouth daily    Plavix 75 Mg Tabs (Clopidogrel bisulfate) .Marland Kitchen... Take one tablet by mouth daily    Nitroglycerin 0.4 Mg Subl (Nitroglycerin) ..... One tablet under tongue every 5 minutes as needed for chest pain---may repeat times three  Patient Instructions: 1)  Return in 6 wks

## 2010-07-02 NOTE — Letter (Signed)
Summary: Colonoscopy Letter  [facility]  [address] [place], [place] [address]   Phone: [phone]  Fax: [phone]      June 08, 2010 MRN: [id number]   SALOME COZBY [address] [place], [place]  [address]   Dear Mr. Heinke,   According to your medical record, it is time for you to schedule a Colonoscopy. The American Cancer Society recommends this procedure as a method to detect early colon cancer. Patients with a family history of colon cancer, or a personal history of colon polyps or inflammatory bowel disease are at increased risk.  This letter has been generated based on the recommendations made at the time of your procedure. If you feel that in your particular situation this may no longer apply, please contact our office.  Please call our office at [phone] to schedule this appointment or to update your records at your earliest convenience.  Thank you for cooperating with [place] to provide you with the very best care possible.   Sincerely,  Judie Petit T. Russella Dar, M.D.  [facility] Gastroenterology Division [phone]

## 2010-07-06 ENCOUNTER — Ambulatory Visit (HOSPITAL_COMMUNITY): Payer: 59 | Attending: Cardiology

## 2010-07-06 DIAGNOSIS — I10 Essential (primary) hypertension: Secondary | ICD-10-CM | POA: Insufficient documentation

## 2010-07-06 DIAGNOSIS — Z5189 Encounter for other specified aftercare: Secondary | ICD-10-CM | POA: Insufficient documentation

## 2010-07-06 DIAGNOSIS — K219 Gastro-esophageal reflux disease without esophagitis: Secondary | ICD-10-CM | POA: Insufficient documentation

## 2010-07-06 DIAGNOSIS — Z7982 Long term (current) use of aspirin: Secondary | ICD-10-CM | POA: Insufficient documentation

## 2010-07-06 DIAGNOSIS — I498 Other specified cardiac arrhythmias: Secondary | ICD-10-CM | POA: Insufficient documentation

## 2010-07-06 DIAGNOSIS — I2510 Atherosclerotic heart disease of native coronary artery without angina pectoris: Secondary | ICD-10-CM | POA: Insufficient documentation

## 2010-07-06 DIAGNOSIS — I200 Unstable angina: Secondary | ICD-10-CM | POA: Insufficient documentation

## 2010-07-06 DIAGNOSIS — E785 Hyperlipidemia, unspecified: Secondary | ICD-10-CM | POA: Insufficient documentation

## 2010-07-06 DIAGNOSIS — E669 Obesity, unspecified: Secondary | ICD-10-CM | POA: Insufficient documentation

## 2010-07-06 DIAGNOSIS — Z79899 Other long term (current) drug therapy: Secondary | ICD-10-CM | POA: Insufficient documentation

## 2010-07-06 DIAGNOSIS — M1990 Unspecified osteoarthritis, unspecified site: Secondary | ICD-10-CM | POA: Insufficient documentation

## 2010-07-06 DIAGNOSIS — Z9861 Coronary angioplasty status: Secondary | ICD-10-CM | POA: Insufficient documentation

## 2010-07-08 ENCOUNTER — Encounter (HOSPITAL_COMMUNITY): Payer: 59 | Attending: Cardiology

## 2010-07-08 DIAGNOSIS — Z7982 Long term (current) use of aspirin: Secondary | ICD-10-CM | POA: Insufficient documentation

## 2010-07-08 DIAGNOSIS — I10 Essential (primary) hypertension: Secondary | ICD-10-CM | POA: Insufficient documentation

## 2010-07-08 DIAGNOSIS — Z9861 Coronary angioplasty status: Secondary | ICD-10-CM | POA: Insufficient documentation

## 2010-07-08 DIAGNOSIS — E669 Obesity, unspecified: Secondary | ICD-10-CM | POA: Insufficient documentation

## 2010-07-08 DIAGNOSIS — M1990 Unspecified osteoarthritis, unspecified site: Secondary | ICD-10-CM | POA: Insufficient documentation

## 2010-07-08 DIAGNOSIS — I498 Other specified cardiac arrhythmias: Secondary | ICD-10-CM | POA: Insufficient documentation

## 2010-07-08 DIAGNOSIS — I200 Unstable angina: Secondary | ICD-10-CM | POA: Insufficient documentation

## 2010-07-08 DIAGNOSIS — E785 Hyperlipidemia, unspecified: Secondary | ICD-10-CM | POA: Insufficient documentation

## 2010-07-08 DIAGNOSIS — I2510 Atherosclerotic heart disease of native coronary artery without angina pectoris: Secondary | ICD-10-CM | POA: Insufficient documentation

## 2010-07-08 DIAGNOSIS — K219 Gastro-esophageal reflux disease without esophagitis: Secondary | ICD-10-CM | POA: Insufficient documentation

## 2010-07-08 DIAGNOSIS — Z79899 Other long term (current) drug therapy: Secondary | ICD-10-CM | POA: Insufficient documentation

## 2010-07-08 DIAGNOSIS — Z5189 Encounter for other specified aftercare: Secondary | ICD-10-CM | POA: Insufficient documentation

## 2010-07-10 ENCOUNTER — Encounter (HOSPITAL_COMMUNITY): Payer: 59

## 2010-07-13 ENCOUNTER — Encounter (HOSPITAL_COMMUNITY): Payer: 59

## 2010-07-15 ENCOUNTER — Encounter (HOSPITAL_COMMUNITY): Payer: 59

## 2010-07-17 ENCOUNTER — Encounter (HOSPITAL_COMMUNITY): Payer: 59

## 2010-07-20 ENCOUNTER — Encounter (HOSPITAL_COMMUNITY): Payer: 59

## 2010-07-22 ENCOUNTER — Encounter (HOSPITAL_COMMUNITY): Payer: 59

## 2010-07-24 ENCOUNTER — Encounter (HOSPITAL_COMMUNITY): Payer: 59

## 2010-07-27 ENCOUNTER — Encounter (HOSPITAL_COMMUNITY): Payer: 59

## 2010-07-29 ENCOUNTER — Encounter (HOSPITAL_COMMUNITY): Payer: 59

## 2010-07-31 ENCOUNTER — Encounter (HOSPITAL_COMMUNITY): Payer: 59 | Attending: Cardiology

## 2010-07-31 DIAGNOSIS — Z7982 Long term (current) use of aspirin: Secondary | ICD-10-CM | POA: Insufficient documentation

## 2010-07-31 DIAGNOSIS — Z79899 Other long term (current) drug therapy: Secondary | ICD-10-CM | POA: Insufficient documentation

## 2010-07-31 DIAGNOSIS — I10 Essential (primary) hypertension: Secondary | ICD-10-CM | POA: Insufficient documentation

## 2010-07-31 DIAGNOSIS — Z9861 Coronary angioplasty status: Secondary | ICD-10-CM | POA: Insufficient documentation

## 2010-07-31 DIAGNOSIS — I498 Other specified cardiac arrhythmias: Secondary | ICD-10-CM | POA: Insufficient documentation

## 2010-07-31 DIAGNOSIS — I200 Unstable angina: Secondary | ICD-10-CM | POA: Insufficient documentation

## 2010-07-31 DIAGNOSIS — E785 Hyperlipidemia, unspecified: Secondary | ICD-10-CM | POA: Insufficient documentation

## 2010-07-31 DIAGNOSIS — K219 Gastro-esophageal reflux disease without esophagitis: Secondary | ICD-10-CM | POA: Insufficient documentation

## 2010-07-31 DIAGNOSIS — I2510 Atherosclerotic heart disease of native coronary artery without angina pectoris: Secondary | ICD-10-CM | POA: Insufficient documentation

## 2010-07-31 DIAGNOSIS — M1990 Unspecified osteoarthritis, unspecified site: Secondary | ICD-10-CM | POA: Insufficient documentation

## 2010-07-31 DIAGNOSIS — E669 Obesity, unspecified: Secondary | ICD-10-CM | POA: Insufficient documentation

## 2010-07-31 DIAGNOSIS — Z5189 Encounter for other specified aftercare: Secondary | ICD-10-CM | POA: Insufficient documentation

## 2010-08-03 ENCOUNTER — Encounter (HOSPITAL_COMMUNITY): Payer: 59

## 2010-08-05 ENCOUNTER — Encounter (HOSPITAL_COMMUNITY): Payer: 59

## 2010-08-07 ENCOUNTER — Encounter (HOSPITAL_COMMUNITY): Payer: 59

## 2010-08-10 ENCOUNTER — Encounter (HOSPITAL_COMMUNITY): Payer: 59

## 2010-08-12 ENCOUNTER — Encounter (HOSPITAL_COMMUNITY): Payer: 59

## 2010-08-14 ENCOUNTER — Encounter (HOSPITAL_COMMUNITY): Payer: 59

## 2010-08-17 ENCOUNTER — Encounter (HOSPITAL_COMMUNITY): Payer: 59

## 2010-08-19 ENCOUNTER — Encounter (HOSPITAL_COMMUNITY): Payer: 59

## 2010-08-21 ENCOUNTER — Encounter (HOSPITAL_COMMUNITY): Payer: 59

## 2010-08-23 LAB — BASIC METABOLIC PANEL
BUN: 11 mg/dL (ref 6–23)
CO2: 25 mEq/L (ref 19–32)
Calcium: 8.9 mg/dL (ref 8.4–10.5)
Chloride: 106 mEq/L (ref 96–112)
Creatinine, Ser: 0.86 mg/dL (ref 0.4–1.5)
GFR calc Af Amer: >60 mL/min (ref >60)
GFR calc non Af Amer: >60 mL/min (ref >60)
Glucose, Bld: 164 mg/dL — ABNORMAL HIGH (ref 70–99)
Potassium: 4.3 mEq/L (ref 3.5–5.1)
Sodium: 137 mEq/L (ref 135–145)

## 2010-08-23 LAB — POCT HEMOGLOBIN-HEMACUE: Hemoglobin: 15.7 g/dL (ref 13.0–17.0)

## 2010-08-24 ENCOUNTER — Encounter (HOSPITAL_COMMUNITY): Payer: 59

## 2010-08-26 ENCOUNTER — Encounter (HOSPITAL_COMMUNITY): Payer: 59

## 2010-08-27 ENCOUNTER — Encounter: Payer: Self-pay | Admitting: Gastroenterology

## 2010-08-27 ENCOUNTER — Ambulatory Visit (AMBULATORY_SURGERY_CENTER): Payer: 59 | Admitting: *Deleted

## 2010-08-27 VITALS — Ht 68.5 in | Wt 249.0 lb

## 2010-08-27 DIAGNOSIS — Z1211 Encounter for screening for malignant neoplasm of colon: Secondary | ICD-10-CM

## 2010-08-27 NOTE — Progress Notes (Signed)
Pt. On Plavix/office visit made

## 2010-08-28 ENCOUNTER — Encounter (HOSPITAL_COMMUNITY): Payer: 59

## 2010-08-31 ENCOUNTER — Encounter (HOSPITAL_COMMUNITY): Payer: Self-pay

## 2010-09-01 NOTE — Letter (Signed)
Summary: New Patient letter  [facility]  [address] [place], [place] [address]   Phone: [phone]  Fax: [phone]       08/27/2010 MRN: [id number]  Lee [name] [address] [place], [place]  [address]  [place]  Dear Lee [name] Lee [name],  Welcome to the Gastroenterology Division at [facility].    You are scheduled to see Dr.  Russella Dar on 10-07-10 at 9:30am on the 3rd floor at [facility], [address]  We ask that you try to arrive at our office 15 minutes prior to your appointment time to allow for check-in.  We would like you to complete the enclosed self-administered evaluation form prior to your visit and bring it with you on the day of your appointment.  We will review it with you.  Also, please bring a complete list of all your medications or, if you prefer, bring the medication bottles and we will list them.  Please bring your insurance card so that we may make a copy of it.  If your insurance requires a referral to see a specialist, please bring your referral form from your primary care physician.  Co-payments are due at the time of your visit and may be paid by cash, check or credit card.     Your office visit will consist of a consult with your physician (includes a physical exam), any laboratory testing he/she may order, scheduling of any necessary diagnostic testing (e.g. x-ray, ultrasound, CT-scan), and scheduling of a procedure (e.g. Endoscopy, Colonoscopy) if required.  Please allow enough time on your schedule to allow for any/all of these possibilities.    If you cannot keep your appointment, please call [phone] to cancel or reschedule prior to your appointment date.  This allows [place] the opportunity to schedule an appointment for another patient in need of care.  If you do not cancel or reschedule by 5 p.m. the business day prior to your appointment date, you will be charged a $50.00 late cancellation/no-show fee.    Thank you for  choosing [facility] for your medical needs.  We appreciate the opportunity to care for you.  Please visit [place] at our website  to learn more about our practice.                     Sincerely,                                                             The Gastroenterology Division

## 2010-09-02 ENCOUNTER — Encounter (HOSPITAL_COMMUNITY): Payer: Self-pay

## 2010-09-04 ENCOUNTER — Encounter (HOSPITAL_COMMUNITY): Payer: Self-pay

## 2010-09-07 ENCOUNTER — Encounter (HOSPITAL_COMMUNITY): Payer: Self-pay

## 2010-09-08 LAB — CBC
Hemoglobin: 12 g/dL — ABNORMAL LOW (ref 13.0–17.0)
MCHC: 33.9 g/dL (ref 30.0–36.0)
RBC: 4.08 MIL/uL — ABNORMAL LOW (ref 4.22–5.81)
WBC: 10.3 10*3/uL (ref 4.0–10.5)

## 2010-09-08 LAB — PROTIME-INR
INR: 2.7 — ABNORMAL HIGH (ref 0.00–1.49)
Prothrombin Time: 30.6 seconds — ABNORMAL HIGH (ref 11.6–15.2)

## 2010-09-08 LAB — APTT: aPTT: 41 seconds — ABNORMAL HIGH (ref 24–37)

## 2010-09-09 ENCOUNTER — Encounter (HOSPITAL_COMMUNITY): Payer: Self-pay

## 2010-09-10 ENCOUNTER — Other Ambulatory Visit: Payer: Self-pay | Admitting: Gastroenterology

## 2010-09-11 ENCOUNTER — Encounter (HOSPITAL_COMMUNITY): Payer: Self-pay

## 2010-09-14 ENCOUNTER — Encounter (HOSPITAL_COMMUNITY): Payer: Self-pay

## 2010-09-16 ENCOUNTER — Encounter (HOSPITAL_COMMUNITY): Payer: Self-pay

## 2010-09-18 ENCOUNTER — Encounter (HOSPITAL_COMMUNITY): Payer: Self-pay

## 2010-09-21 ENCOUNTER — Encounter (HOSPITAL_COMMUNITY): Payer: Self-pay

## 2010-09-23 ENCOUNTER — Encounter (HOSPITAL_COMMUNITY): Payer: Self-pay

## 2010-09-25 ENCOUNTER — Encounter (HOSPITAL_COMMUNITY): Payer: Self-pay

## 2010-09-28 ENCOUNTER — Encounter (HOSPITAL_COMMUNITY): Payer: Self-pay

## 2010-09-30 ENCOUNTER — Encounter (HOSPITAL_COMMUNITY): Payer: Self-pay

## 2010-10-02 ENCOUNTER — Encounter (HOSPITAL_COMMUNITY): Payer: Self-pay

## 2010-10-05 ENCOUNTER — Encounter (HOSPITAL_COMMUNITY): Payer: Self-pay

## 2010-10-07 ENCOUNTER — Encounter: Payer: Self-pay | Admitting: Gastroenterology

## 2010-10-07 ENCOUNTER — Ambulatory Visit (INDEPENDENT_AMBULATORY_CARE_PROVIDER_SITE_OTHER): Payer: 59 | Admitting: Gastroenterology

## 2010-10-07 ENCOUNTER — Encounter (HOSPITAL_COMMUNITY): Payer: Self-pay

## 2010-10-07 VITALS — BP 132/80 | HR 76 | Ht 68.0 in | Wt 248.8 lb

## 2010-10-07 DIAGNOSIS — K219 Gastro-esophageal reflux disease without esophagitis: Secondary | ICD-10-CM

## 2010-10-07 DIAGNOSIS — I2510 Atherosclerotic heart disease of native coronary artery without angina pectoris: Secondary | ICD-10-CM

## 2010-10-07 DIAGNOSIS — Z1211 Encounter for screening for malignant neoplasm of colon: Secondary | ICD-10-CM

## 2010-10-07 NOTE — Progress Notes (Signed)
History of Present Illness: This is a 60 year old male he returns for colorectal cancer screening and followup of GERD. He states his reflux symptoms are under very good control on anti reflux measures and pantoprazole. He underwent upper endoscopy in 2004 which was unremarkable. He underwent screening colonoscopy in January 2002 showing mild sigmoid colon diverticulosis and internal hemorrhoids. He is due for routine screening colonoscopy. He was hospitalized in January 2012 and 2 drug-eluting coronary artery stents were placed. He previously had stents placed several years prior to that. He has also undergone radiofrequency ablation for atrial fibrillation in 2010. He is maintained on Plavix and aspirin. He denies melena, hematochezia, change in bowel habits, change in stool caliber, weight loss, nausea, vomiting, dysphagia, weight loss.  Past Medical History  Diagnosis Date  . Arthritis   . GERD (gastroesophageal reflux disease)   . Hyperlipidemia   . Hypertension   . COPD (chronic obstructive pulmonary disease)   . PVC's (premature ventricular contractions)   . Cataract   . Paroxysmal atrial fibrillation   . CAD (coronary artery disease)   . DJD (degenerative joint disease)   . Allergic rhinitis   . Migraines   . Iron deficiency anemia   . Esophagitis 1991    grade 1  . Diverticulosis   . Hemorrhoids    Past Surgical History  Procedure Date  . Lasik   . Nasal sinus surgery 2001  . Coronary angioplasty with stent placement     3 stents/ 4 caths  . Knee arthroscopy     right  . Neck surgery     Cervical fusion  . Shoulder open rotator cuff repair     right  . Rotator cuff repair     left  . Wrist arthrocentesis     left  . Spine surgery   . Radiofrequency ablation May 2010    atrial fibrillation    reports that he quit smoking about 23 years ago. He has never used smokeless tobacco. He reports that he drinks alcohol. He reports that he does not use illicit drugs. family  history includes Colon cancer in his mother; Diabetes in his father; Heart disease in his father; and Uterine cancer in his mother. Allergies  Allergen Reactions  . Toprol Xl (Metoprolol Succinate) Other (See Comments)    Personality change   Outpatient Encounter Prescriptions as of 10/07/2010  Medication Sig Dispense Refill  . aspirin 325 MG EC tablet Take 325 mg by mouth daily.        Marland Kitchen BYSTOLIC 5 MG tablet Take 1 tablet by mouth daily.       Marland Kitchen CIALIS 20 MG tablet Take 1 tablet by mouth daily as needed.       . CRESTOR 10 MG tablet Take 1 tablet by mouth at bedtime.       . fluticasone (FLONASE) 50 MCG/ACT nasal spray 2 sprays by Nasal route at bedtime.       . furosemide (LASIX) 40 MG tablet Take 1 tablet by mouth daily.       Marland Kitchen HYDROcodone-acetaminophen (VICODIN) 5-500 MG per tablet Take 1 tablet by mouth as needed. Takes 1-2 for migraines      . MICARDIS 40 MG tablet Take 1 tablet by mouth daily.       . nitroGLYCERIN (NITROSTAT) 0.3 MG SL tablet Place 0.4 mg under the tongue every 5 (five) minutes as needed. Chest pain       . pantoprazole (PROTONIX) 40 MG tablet Take 1 tablet by  mouth daily.       Marland Kitchen PLAVIX 75 MG tablet Take 1 tablet by mouth daily.       . potassium chloride (MICRO-K) 10 MEQ CR capsule Take 1 capsule by mouth daily.        Review of Systems: Allergies, arthritis, headaches, hearing problems. Pertinent positive and negative review of systems were noted in the above HPI section. All other review of systems were otherwise negative.  Physical Exam: General: Well developed , well nourished, no acute distress Head: Normocephalic and atraumatic Eyes:  sclerae anicteric, EOMI Ears: Normal auditory acuity Mouth: No deformity or lesions Neck: Supple, no masses or thyromegaly Lungs: Clear throughout to auscultation Heart: Regular rate and rhythm; no murmurs, rubs or bruits Abdomen: Soft, non tender and non distended. No masses, hepatosplenomegaly or hernias noted. Normal  Bowel sounds Musculoskeletal: Symmetrical with no gross deformities  Skin: No lesions on visible extremities Pulses:  Normal pulses noted Extremities: No clubbing, cyanosis, edema or deformities noted Neurological: Alert oriented x 4, grossly nonfocal Cervical Nodes:  No significant cervical adenopathy Inguinal Nodes: No significant inguinal adenopathy Psychological:  Alert and cooperative. Normal mood and affect  Assessment and Recommendations:  1. GERD. Continue standard antireflux measures and pantoprazole 40 mg every morning. Ongoing followup with his primary physician. Endoscopy in 2004 did not reveal evidence of Barrett's or erosive esophagitis.  2. Colorectal cancer screening. He is due for screening colonoscopy however we will need to review the options for a temporary hold of Plavix with his cardiologist prior to proceeding. His cardiologist may want to wait one year from his recent stent placement for a 5-7 day hold Plavix. Obtain stool Hemoccults. We will plan to proceed with colonoscopy in January 2013 if his stool Hemoccults are negative and his cardiologist feels it would be appropriate to wait one year.  We could proceed with colonoscopy while on Plavix as long as he understands the slightly increased risk for bleeding with polypectomy and biopsy.   3. Coronary artery disease status post stent placement. See above.  4. Diverticulosis. Long-term high fiber diet with adequate daily water intake.

## 2010-10-07 NOTE — Patient Instructions (Addendum)
Follow instructions on Hemoccult cards and mail back to [place] when finished. We will contact your Cardiologist regarding your Plavix clearance. We changed your recall Colonoscopy till Jan 2013. HQ:IONGEX Little, MD

## 2010-10-09 ENCOUNTER — Encounter (HOSPITAL_COMMUNITY): Payer: Self-pay

## 2010-10-09 ENCOUNTER — Encounter: Payer: Self-pay | Admitting: Internal Medicine

## 2010-10-12 ENCOUNTER — Encounter: Payer: Self-pay | Admitting: Internal Medicine

## 2010-10-12 ENCOUNTER — Encounter (HOSPITAL_COMMUNITY): Payer: Self-pay

## 2010-10-12 ENCOUNTER — Ambulatory Visit (INDEPENDENT_AMBULATORY_CARE_PROVIDER_SITE_OTHER): Payer: 59 | Admitting: Internal Medicine

## 2010-10-12 DIAGNOSIS — I2510 Atherosclerotic heart disease of native coronary artery without angina pectoris: Secondary | ICD-10-CM

## 2010-10-12 DIAGNOSIS — I10 Essential (primary) hypertension: Secondary | ICD-10-CM

## 2010-10-12 DIAGNOSIS — I4891 Unspecified atrial fibrillation: Secondary | ICD-10-CM

## 2010-10-12 NOTE — Assessment & Plan Note (Addendum)
Doing very well s/p afib ablation, without recurrence off AAD therapy. I have reviewed ekgs from [facility] ([facility]) and also from Dr Caren Macadam office which confirm sinus tachycardia as his presenting rhythm 06/04/10. No further workup is planned at this time.  He has done well for 2 years post ablation.  If no further afib upon return in 6 months, I will begin to see him annually. He will continue to follow-up with Dr Clarene Duke.

## 2010-10-12 NOTE — Patient Instructions (Signed)
Your physician wants you to follow-up in: 6 months with Dr. Allred. You will receive a reminder letter in the mail two months in advance. If you don't receive a letter, please call our office to schedule the follow-up appointment.  

## 2010-10-12 NOTE — Assessment & Plan Note (Signed)
Stable. No changes today. 

## 2010-10-12 NOTE — Assessment & Plan Note (Signed)
Stable s/p PCI in January He will continue to follow with Dr Clarene Duke

## 2010-10-12 NOTE — Progress Notes (Signed)
The patient presents today for routine electrophysiology followup.  He reports doing well recenlty.  He developed sinus tachycardia 06/04/10 for which he was evaluated by [facility] and found to have CAD requiring PCI of the RCA and LAD.  He has done well since that time.  He has rare palpitations due to PVCs but has had no further afib since his ablation.  Today, he denies symptoms of palpitations, chest pain, shortness of breath, orthopnea, PND, lower extremity edema, dizziness, presyncope, syncope, or neurologic sequela.  The patient feels that he is tolerating medications without difficulties and is otherwise without complaint today.   Past Medical History  Diagnosis Date  . Arthritis   . GERD (gastroesophageal reflux disease)   . Hyperlipidemia   . Hypertension   . COPD (chronic obstructive pulmonary disease)   . PVC's (premature ventricular contractions)   . Cataract   . Paroxysmal atrial fibrillation     s/p afib ablation 10/22/08  . CAD (coronary artery disease)     PCI RCA and LAD 1/12  . DJD (degenerative joint disease)   . Allergic rhinitis   . Migraines   . Iron deficiency anemia   . Esophagitis 1991    grade 1  . Diverticulosis   . Hemorrhoids    Past Surgical History  Procedure Date  . Lasik   . Nasal sinus surgery 2001  . Coronary angioplasty with stent placement     3 stents/ 4 caths  . Knee arthroscopy     right  . Neck surgery     Cervical fusion  . Shoulder open rotator cuff repair     right  . Rotator cuff repair     left  . Wrist arthrocentesis     left  . Spine surgery   . Radiofrequency ablation May 2010    atrial fibrillation    Current Outpatient Prescriptions  Medication Sig Dispense Refill  . aspirin 325 MG EC tablet Take 325 mg by mouth daily.        Marland Kitchen BYSTOLIC 5 MG tablet Take 1 tablet by mouth daily.       Marland Kitchen CIALIS 20 MG tablet Take 1 tablet by mouth daily as needed.       . CRESTOR 10 MG tablet Take 1 tablet by mouth at bedtime.       .  fluticasone (FLONASE) 50 MCG/ACT nasal spray 2 sprays by Nasal route at bedtime.       . furosemide (LASIX) 40 MG tablet Take 1 tablet by mouth daily.       Marland Kitchen HYDROcodone-acetaminophen (VICODIN) 5-500 MG per tablet Take 1 tablet by mouth as needed. Takes 1-2 for migraines      . MICARDIS 40 MG tablet Take 1 tablet by mouth daily.       . nitroGLYCERIN (NITROSTAT) 0.3 MG SL tablet Place 0.4 mg under the tongue every 5 (five) minutes as needed. Chest pain       . pantoprazole (PROTONIX) 40 MG tablet Take 1 tablet by mouth daily.       Marland Kitchen PLAVIX 75 MG tablet Take 1 tablet by mouth daily.       . potassium chloride (MICRO-K) 10 MEQ CR capsule Take 1 capsule by mouth daily.         Allergies  Allergen Reactions  . Toprol Xl (Metoprolol Succinate) Other (See Comments)    Personality change    History   Social History  . Marital Status: Married    Spouse Name:  N/A    Number of Children: 2  . Years of Education: N/A   Occupational History  . retired    Social History Main Topics  . Smoking status: Former Smoker    Quit date: 06/01/1987  . Smokeless tobacco: Never Used  . Alcohol Use: Yes     once every 6-7 months  . Drug Use: No  . Sexually Active: Not on file   Other Topics Concern  . Not on file   Social History Narrative  . No narrative on file    Family History  Problem Relation Age of Onset  . Heart disease Father     and sister  . Diabetes Father   . Colon cancer Mother     mets from uterine  . Uterine cancer Mother    Physical Exam: Filed Vitals:   10/12/10 1551  BP: 110/80  Pulse: 70  Height: 5\' 8"  (1.727 m)  Weight: 250 lb (113.399 kg)    GEN- The patient is well appearing, alert and oriented x 3 today.   Head- normocephalic, atraumatic Eyes-  Sclera clear, conjunctiva pink Ears- hearing intact Oropharynx- clear Neck- supple, no JVP Lymph- no cervical lymphadenopathy Lungs- Clear to ausculation bilaterally, normal work of breathing Heart- Regular  rate and rhythm, no murmurs, rubs or gallops, PMI not laterally displaced GI- soft, NT, ND, + BS Extremities- no clubbing, cyanosis, or edema MS- no significant deformity or atrophy Skin- no rash or lesion Psych- euthymic mood, full affect Neuro- strength and sensation are intact  EKG today reveals sinus rhythm 70 bpm, PR 208, RBBB, otherwise normal ekg EKG from 06/04/10 Dr Caren Macadam office reveals sinus tachycardia 118 bpm with RBBB  Assessment and Plan:

## 2010-10-13 ENCOUNTER — Telehealth: Payer: Self-pay

## 2010-10-13 ENCOUNTER — Encounter: Payer: Self-pay | Admitting: Internal Medicine

## 2010-10-13 NOTE — Op Note (Signed)
NAME:  DAUNTAE, DERUSHA NO.:  [id number]   MEDICAL RECORD NO.:  [id number]          PATIENT TYPE:  OIB   LOCATION:  2853                         FACILITY:  [facility]   PHYSICIAN:  Darlin Priestly, MD  DATE OF BIRTH:  10/07/1950   DATE OF PROCEDURE:  11/11/2006  DATE OF DISCHARGE:                               OPERATIVE REPORT   PROCEDURE:   PROCEDURE:  Removal of a Medtronic Reveal Plus loupe recorder model  [id number], serial number [id number].   SURGEON:  Darlin Priestly, M.D.   COMPLICATIONS:  None.   INDICATIONS:  The patient is a 60 year old male patient of Dr. Caprice Kluver, Dr. Toma Aran with a history of hypertension, hyperlipidemia,  history of intermittent episodes of presyncope.  He underwent loupe  recorder implanted on December 25, 2004 which revealed intermittent episodes  of SVT, atrial fib/flutter.  The loupe recorder has now reached end of  life.  He is now referred for loupe explant.   DESCRIPTION OF OPERATION:  Informed giving informed written consent, the  patient was brought to the cardiac cath lab where left chest was shaved,  prepped and draped in sterile fashion.  Anesthesia monitoring was  established.  1% lidocaine was used to anesthetize over the previously  implanted device.  Next approximately 1.5 cm incision then carried out  over the superior aspect of the device just inferior to the previous  incision.  Hemostasis obtained with electrocautery.  Blunt dissection  was used to carry this down to the loupe capsule.  The capsule was then  opened, and the anchoring sutures identified.  Both the sutures were  clipped and the loupe recorder device was then removed.  The sutures  then removed from the base of the pocket.  The pocket was then inspected  and appeared to be free of any types of infection.  This was copiously  irrigated with 1% Kanamycin solution.  Again hemostasis confirmed.  The  subcutaneous layer was then closed with 2-0 Vicryl.  The  skin was then  closed with 4-0 Vicryl.  Steri-Strips were applied.  The patient was  then returned to recovery room in stable condition.   CONCLUSIONS:  Successful explant of a Medtronic Reveal Plus model number  [id number] loupe recorder serial number [id number].      Darlin Priestly, MD  Electronically Signed     RHM/MEDQ  D:  11/11/2006  T:  11/11/2006  Job:  [id number]   cc:   Thereasa Solo. Little, M.D.  Jari Pigg, M.D.

## 2010-10-13 NOTE — Telephone Encounter (Signed)
See 5/9 office note: If hemoccults are negative plan for 06/2011 colon recall and holding Plavix for procedure. If hemoccults are positive will need to consider colonoscopy on Plavix now.

## 2010-10-13 NOTE — Op Note (Signed)
NAME:  Lee [name], Lee [name] NO.:  [id number]   MEDICAL RECORD NO.:  [id number]          PATIENT TYPE:  INP   LOCATION:  2917                         FACILITY:  [facility]   PHYSICIAN:  Hillis Range, MD       DATE OF BIRTH:  03-01-1951   DATE OF PROCEDURE:  DATE OF DISCHARGE:                               OPERATIVE REPORT   SURGEON:  Hillis Range, MD   FIRST ASSISTANT:  Doylene Canning. Ladona Ridgel, MD   PREPROCEDURE DIAGNOSES:  1. Supraventricular tachycardia.  2. Paroxysmal atrial fibrillation.   POSTPROCEDURE DIAGNOSIS:  Paroxysmal atrial fibrillation.   PROCEDURES:  1. Comprehensive electrophysiology study.  2. Coronary sinus pacing and recording.  3. Three-dimensional mapping of supraventricular tachycardia.  4. Radiofrequency ablation of supraventricular tachycardia.  5. Isoproterenol infusion.  6. Intracardiac echocardiography.  7. Pulmonary vein venography.  8. Transseptal puncture of an intact septum.  9. Cardioversion.   INTRODUCTION:  Mr. Agcaoili is a pleasant 60 year old gentleman with a  history of supraventricular tachycardia and paroxysmal atrial  fibrillation who now presents for EP study and radiofrequency ablation.  He reports symptomatic palpitations lasting 3-4 minutes several times  per week.  He reports feeling washed out after each episode.  He was  previously evaluated at [facility] for paroxysmal atrial fibrillation and  initiated on Tikosyn.  He subsequently had an implantable loop recorder  placed, which documented a regular-appearing narrow complex tachycardia  as well as degeneration at times into an irregular tachycardia likely  consistent with atrial fibrillation.  He now presents for EP study and  radiofrequency ablation.   DESCRIPTION OF PROCEDURE:  Informed written consent was obtained and the  patient was brought to the electrophysiology lab in the fasting state.  He was adequately sedated with medications as outlined in the anesthesia  report.  The patient's right and left groins were prepped and draped in  the usual sterile fashion by the EP lab staff.  Using a percutaneous  Seldinger technique, one 6-French, one 7-French, and one 11-French  hemostasis sheaths were placed into the right common femoral vein.  The  left common femoral vein was accessed and a guidewire was advanced into  the common iliac vein; however, an 11-French hemostasis sheath could not  easily be advanced into the left common femoral vein.  Due to the  patient's anticoagulation and large body habitus, I elected to therefore  abandoned the left common femoral vein and direct manual pressure was  applied.  A 6-French decapolar Polaris X catheter was introduced through  the right common femoral vein and advanced into the coronary sinus for  recording and pacing from this location.  A 6-French quadripolar  Josephson catheter was introduced through the right common femoral vein  and advanced into the right ventricle for recording and pacing.  This  catheter was then pulled back to the His bundle location.  The patient  presented to the electrophysiology lab in normal sinus Lee [name] with a  right bundle-branch QRS morphology.  The RR interval measured 800  milliseconds.  The PR interval measured 187 milliseconds with an QRS  duration of 152  milliseconds and a QT interval of 448 milliseconds.  The  AH interval measured 114 milliseconds with an HV interval of 55  milliseconds.  Ventricular pacing was performed which revealed midline  decremental VA conduction with a VA Wenckebach cycle length of 360  milliseconds with no tachycardias induced.  Programmed extrastimulus  testing was performed from the right ventricular apex position, which  revealed midline decremental VA conduction with a ventricular ERP of  500/370 milliseconds.  Rapid atrial pacing was performed, which revealed  PR equal to RR with an AV Wenckebach cycle length of 370 milliseconds.   Tachycardia was not induced.  Atrial extrastimulus testing was  performed, which revealed multiple AH jumps with single echo beats, but  no tachycardias induced.  The AV nodal ERP was 500/290 milliseconds.  Rapid atrial pacing was again performed, which revealed PR equal to RR,  but no inducible AV nodal reentrant tachycardia despite multiple  efforts.  Isoproterenol was then infused at 2 mcg per minute and rapid  atrial pacing was again performed, which revealed no evidence of PR  greater than RR with an AV Wenckebach cycle length of 290 milliseconds.  Isoproterenol was decreased to 1 mcg per minute and atrial extrastimulus  testing was performed, which revealed an AH jump and single echo beat,  but no tachycardias induced.  The AV nodal ERP was 450/170 milliseconds.  Again, rapid atrial pacing was performed down to a cycle length of 200  milliseconds during isoproterenol infusion at 1 mcg per minute with no  tachycardias induced.  Atrial extrastimulus testing was again performed  down to 400/300/200 with no tachycardias induced.  Isoproterenol was  discontinued and allowed to wash out.  Rapid atrial pacing was again  performed down to the cycle length of 190 milliseconds, at which time,  atrial fibrillation was induced.  Attention was then turned to atrial  fibrillation ablation.  The His catheter was removed and in its place a  Delphi intracardiac echocardiography catheter was  advanced into the right atrium.  Intracardiac echocardiography was  performed of the interatrial septum and left atrium.  This demonstrated  a normal-sized left atrium with very large left inferior and right  inferior pulmonary veins.  The left superior and right superior  pulmonary veins appeared to be normal in size.  The middle right common  femoral vein sheath was exchanged for an 8.5 [place] SL2 transseptal  sheath and transseptal access was achieved in a standard fashion using a   Brockenbrough needle under biplane fluoroscopy with intracardiac  echocardiography and visualization of the transseptal puncture.  Once  transseptal access had been achieved, heparin was administered  intravenously and intraarterially in order to maintain an ACT of greater  than 400 seconds throughout the procedure.  The intracardiac  echocardiography catheter was removed.  A 6-French multipurpose  angiographic catheter with guidewire was introduced through the  transseptal sheath and positioned over the mouth of all four pulmonary  veins.  Pulmonary venograms were performed by hand injection of nonionic  contrast and demonstrated moderate-sized left superior and left inferior  pulmonary veins (20-25 mm in size) with no evidence of pulmonary vein  stenosis.  The right inferior and left inferior pulmonary veins were  noted to be very large in size measuring greater than 25 mm.  The  angiographic catheter was then removed.  The transseptal sheath was  pulled back into the IVC over a guidewire.  A 3.5 mm Biosense Webster EZ  [employer] ThermoCool ablation catheter  was introduced through the right  common femoral vein and advanced into the right atrium.  The ablation  catheter was advanced across the transseptal hole using the wire as a  guide.  The transseptal sheath was then re-advanced over the guidewire  into the left atrium.  A 25 mm 20-pole circular mapping catheter was  introduced through the transseptal sheath and positioned over the mouth  of all four pulmonary veins.  Three-dimensional electroanatomical  mapping was performed using [employer].  The patient then  underwent successful sequential electrical isolation and anatomical  encircling of all four pulmonary veins using radiofrequency current with  the circular mapping catheter as a guide.  The patient was then  successfully cardioverted to sinus Lee [name] with a single synchronized 200-  joule biphasic shock with  cardioversion electrodes in the anterior-  posterior thoracic configuration.  Interestingly during atrial  fibrillation, the patient's RR interval was noted to be quite regular at  times; however, his atrial activations along the coronary sinus catheter  were clearly atrial fibrillation with no evidence of a regular rise  tachycardia.  No atrial flutters or atrial tachycardias were observed.  The patient remained in sinus Lee [name] for the duration of the study.  Pacing was then performed from the left atrium and the circular mapping  catheter was positioned over the mouth of all four pulmonary veins to  confirm complete electrical isolation of all four pulmonary veins.  The  procedure was therefore considered completed.  All catheters were  removed and the sheaths were aspirated and flushed.  The intracardiac  echocardiography catheter was again advanced into the right atrium and  no pericardial effusion was observed.  This catheter was then removed.  The patient was transferred to the recovery area for sheath removal per  protocol.  A limited bedside transthoracic echocardiogram revealed no  pericardial effusion.  There were no early apparent complications.   CONCLUSIONS:  1. Sinus Lee [name] upon presentation.  2. No inducible atrial tachycardias, atrial flutters, or AV nodal      reentrant tachycardia.  3. No evidence of accessory pathways.  4. The patient did have dual AV nodal physiology, but only single echo      beats with no AVNRT induced.  I therefore did not ablate the      patient's slow pathway.  5. Inducible atrial fibrillation.  6. Successful electrical isolation and anatomical encircling of all      four pulmonary veins using radiofrequency current.  7. Successful cardioversion to sinus Lee [name].  8. No early apparent complications.      Hillis Range, MD  Electronically Signed     JA/MEDQ  D:  10/22/2008  T:  10/23/2008  Job:  [id number]   cc:   Thereasa Solo. Little, M.D.

## 2010-10-13 NOTE — Telephone Encounter (Signed)
Notified patient that I will call him after we receive results from Hemoccult cards and Dr. Ardell Isaacs plan if they are positive for blood. Pt agreed and verbalized understanding.

## 2010-10-13 NOTE — Cardiovascular Report (Signed)
NAME:  Lee [name], Lee [name] NO.:  [id number]   MEDICAL RECORD NO.:  [id number]          PATIENT TYPE:  INP   LOCATION:  6526                         FACILITY:  [facility]   PHYSICIAN:  Thereasa Solo. Little, M.D. DATE OF BIRTH:  26-May-1951   DATE OF PROCEDURE:  03/05/2008  DATE OF DISCHARGE:                            CARDIAC CATHETERIZATION   INDICATIONS FOR TEST:  This 60 year old male had a cath several years  ago showing only mild coronary artery disease.  He presented to the  emergency room on the night of March 04, 2008, with complaints of chest  discomfort and shortness of breath that had been going on about 72  hours.  His EKG shows a right bundle-branch block.  His cardiac markers  were unremarkable, but he has multiple risk factors for coronary artery  disease and because of this he is brought to the lab for cardiac  catheterization.   After obtaining informed consent, the patient was prepped and draped in  the usual sterile fashion exposing the right groin.  Following local  anesthetic with 1% Xylocaine, the Seldinger technique was employed and a  5-French introducer sheath was placed in the right femoral artery.  Left  and right coronary arteriography was performed.  A ventriculogram was  performed at the end of the procedure and a percutaneous coronary  intervention to his LAD was performed.   COMPLICATIONS:  None.   TOTAL CONTRAST:  255 mL.   MEDICATIONS:  The patient was given 600 mg of oral Plavix, Angiomax IV,  and 20 mg of IV Plavix.   RESULTS:  1. Hemodynamic monitoring:  Central aortic pressure was 129/78.  Left      ventricular pressure was 129/5 with no aortic valve gradient at the      time of pullback.  2. Ventriculography:  Ventriculography in the RAO projection using 25      mL of contrast at 12 mL per second showed normal LV systolic      function.  The ejection fraction was in excess of 60%.  The end-      diastolic pressure was 17.  There  was marked ventricular ectopy      during the ventriculogram.   CORONARY ARTERIOGRAPHY:  1. Left main normal.  It bifurcated.  2. Circumflex.  The circumflex was a large vessel that bifurcated.  At      the bifurcation to the most superior limb of the bifurcation, there      was a 40% ostial narrowing.  The remainder of the circumflex system      was free of disease.  3. LAD.  There was a proximal tubular 70% area of narrowing in the      LAD, it was about 15-20 mm in length.  In the midportion of the      LAD, there was a focal 80% plus area of narrowing.  The distal LAD      had minimal irregularities and it wrapped around the apex of the      heart.  The first diagonal was a medium-sized vessel with no  significant disease.  4. Right coronary artery.  The right coronary artery had proximal      irregularities.  There were eccentric and the most significant area      was about 40%.  The PDA and posterior lateral vessels were both      free of disease.   Percutaneous intervention was then undertaken using IV Angiomax.  The  system was upgraded to a 6-French system.  A JL-4 guide catheter was  cannulated the ostium the best, a 3.5 gave very poor backup.  A short  Luge wire was placed down the distal LAD as it wrapped around the apex  of the heart.  Primary stenting to the LAD with a 2.5 x 12 endeavor  stent was performed.  It was initially deployed at 14 atmospheres for 40  seconds.  The final inflation was 10 atmospheres for 30 seconds.   Post-dilatation was performed using a 2.75 x 9 Keene sprinter, 10  atmospheres for 40 seconds and then 9 atmospheres for 35 seconds.  This  resulted in the 80% area of narrowing pre-intervention, now appearing to  be normal.  There was no evidence of the dissection or thrombus and  there was TIMI III flow pre and post.   With deflating the post-dilatation balloon to 10 atmospheres, the vessel  obtained a 2.77 diameter.  The sheaths were removed  later today.  He  will probably be ready for discharge in the morning with followup in my  office.  He will need Plavix indefinitely.           ______________________________  Thereasa Solo Little, M.D.     ABL/MEDQ  D:  03/05/2008  T:  03/06/2008  Job:  [id number]   cc:   [facility]  Cardiac Cath Lab

## 2010-10-13 NOTE — Discharge Summary (Signed)
NAME:  Lee [name], Lee [name] NO.:  [id number]   MEDICAL RECORD NO.:  [id number]          PATIENT TYPE:  INP   LOCATION:  6526                         FACILITY:  [facility]   PHYSICIAN:  Thereasa Solo. Little, M.D. DATE OF BIRTH:  01/01/1951   DATE OF ADMISSION:  03/04/2008  DATE OF DISCHARGE:  03/06/2008                               DISCHARGE SUMMARY   DISCHARGE DIAGNOSES:  1. Unstable angina, resolved.      a.     Negative myocardial infarction.  2. Coronary artery disease, which is new, from mid left anterior      descending stenosis of 80%, undergoing percutaneous transluminal      coronary angioplasty stent deployment with Endeavor stent.  3. Supraventricular tachycardia history and prior to admission he felt      he was having increased episodes, none documented during      hospitalization, questionably related to ischemia as an outpatient.  4. Right bundle-branch block that was new in August 2009, is resolved      postprocedure.  5. Gastroesophageal reflux disease.  6. Hypertension.  7. Dyslipidemia.  8. History of migraines with difficulty taking Topamax with stomach      upset.  9. Previously he had noncritical coronary disease and cath in 2007.  10.History of back pain.   DISCHARGE CONDITION:  Improved.   PROCEDURES:  On March 05, 2008, combined left heart cath by Dr. Julieanne Manson.   On March 05, 2008, PTCA and stent deployment with an Endeavor 2.5 mm x  12 mm mid LAD stent secondary to 80% stenosis.  Continued disease of  proximal 40% RCA stenosis and circ had a 40% in ongoing OM.   DISCHARGE MEDICATIONS:  1. Tikosyn 375 mg 1 twice a day.  2. Cartia XT 360 mg daily.  3. Micardis 20 mg daily.  4. Lasix 20 mg daily.  5. Crestor 10 mg daily.  6. Protonix 40 mg daily which was actually stopped and he was      discharged with Zantac 150 mg twice a day with the hope it will      control of reflux issues, if not then Dr. Clarene Duke will readdress       Protonix.  7. Aspirin 325 mg daily.  8. Fexofenadine 180 mg daily.  9. Nasonex nasal spray 2 sprays each nostril as needed.  10.Topamax 25 mg, he should be taking daily, but he will take it for a      few weeks and then has to stop because of stomach irritation and      then he resumes.  11.Baclofen 10 mg as needed.  12.Diclofenac sodium as needed.  13.Plavix 75 mg 1 daily, do not stop it because it could cause heart      attack.  14.Nitroglycerin 1:150 under tongue while sitting as needed for chest      pain as per cardiac rehab instructions.  15.Fioricet 1-2 every 4 to 6 hours as needed for headache until he see      Dr. Sharene Skeans.   DISCHARGE INSTRUCTIONS:  1. Low-sodium, heart-healthy diet.  2. Do not  return to work until March 11, 2008, and then only work 40      hours per week until you see Dr. Clarene Duke.  3. Wash cath site with soap and water.  Call if any bleeding,      swelling, or drainage.  4. Increase activity slowly.  May shower.  No lifting for 2 days.  No      driving for 2 days.  5. Follow up with Dr. Clarene Duke on April 03, 2008, at 11:20 a.m.  6. Watch diet closely, decrease fat. Please note, the patient is on      Crestor 10, but at higher doses of statins he has developed      significant myalgias.  7. See Dr. Sharene Skeans to adjust migraine medications.  Fioricet is given      and you can try until you see her back for migraine.  Stop taking      Protonix for now.   HISTORY OF PRESENT ILLNESS:  A 60 year old white married male with a  history of SVT, on Tikosyn and new right bundle-branch block as of  August 2009.  His SVT is followed at [facility].  He had not had any episodes  of SVT recently and then over the last day or so prior to admission he  was having chest discomfort, pressure and tachycardia episodically.  The  patient called our office and was sent to the emergency room.  He  described the pain as sharp in the left breast area, comes with rest and  exertion and had  not awakened him from sleep.  They last 20-13 minutes  and then resolve.  No associated symptoms of nausea, vomiting,  diaphoresis, or shortness of breath.  He also complained of increasing  episodes of tachycardia with dizziness which is what is felt like prior  to starting to Tikosyn.  He had not had any syncope.  In the ER, he did  have some mild chest discomfort.  EKG revealed right bundle-branch  block, but no acute changes.  The right bundle was seen on EKG in August  2009.  Previously, it had incomplete bundle, but now with the right  bundle.   The patient was admitted and placed on IV heparin and nitroglycerin, and  plans were for cardiac catheterization.   PAST MEDICAL HISTORY:  The patient underwent cardiac cath in October  2007.  EF was normal at 60%.  No MR.  Mild noncritical disease, 20% of  the RCA.  Left circumflex with focal 90% stenosis of the distal branch  and 50% mid LAD disease.  He has had a history of SVT, initially was  diagnosed secondary to syncope, but had not had any true episodes on the  Tikosyn.  He also has a history of migraines, dyslipidemia, back pain,  hypertension, and reflux disease.   ALLERGIES:  BETA-BLOCKERS and LISINOPRIL causes called.  HIGHER DOSES OF  STATINS cause myalgias.  He tolerates Crestor 10, but does not tolerate  CRESTOR 20 MG.   FAMILY HISTORY, SOCIAL HISTORY, AND REVIEW OF SYSTEMS:  See H and P.   PHYSICAL EXAMINATION ON DISCHARGE:  VITAL SIGNS:  Blood pressure 118/61,  pulse 81, respirations 20, temperature 97.8, and oxygen saturation room  air was 95%.  GENERAL:  Alert and oriented white male in no acute distress.  SKIN:  Warm and dry.  No complaints.  HEART:  Regular rate and rhythm without murmur.  LUNGS:  Clear without rales, rhonchi, or wheezes.  EXTREMITIES:  Right groin cath site had minimal bruising.  No hematoma.   LABORATORY DATA:  On admission, hemoglobin 14.2, hematocrit 42.3, WBC  6.2, platelets 239, and MCV  88.5.  At discharge, hemoglobin 12.9 and  hematocrit 38.9.   Chemistry:  Sodium 141, potassium 4.1, chloride 108, CO2 of 26, BUN 11,  creatinine 0.82, and glucose 89, no acute changes on discharge.  Admission protime 13.3, INR 1, and PTT 27 and with heparin he was  therapeutic.  On GI, AST 26, ALT 25, alkaline phos 65, total bili 0.4,  and albumin 3.9.   Cardiac markers were all negative with CKs 163, 139, 120, and 71; MBs  4.4, 3.5, 3.1, and 2.0; and troponin I is negative at 0.01-0.03 and  postprocedure they were negative as well.  Calcium was 9.2, magnesium  2.0, and TSH was 1.269.   Chest x-ray on admission, no acute cardiopulmonary disease.   HOSPITAL COURSE:  The patient was admitted by Dr. Mariah Milling on call for Dr.  Clarene Duke secondary to unstable angina and outpatient runs of presumed SVT  per patient's description and he was started on IV heparin and  nitroglycerin and the next morning, he was seen by Dr. Clarene Duke and plans  were for cardiac catheterization.   The patient underwent cardiac cath and was found to have 80% mid LAD  stenosis and underwent PTCA and stent deployment with an Endeavor stent.  The patient tolerated the procedure well.   Postprocedure EKGs initially continue with right bundle-branch block,  but by the morning of March 06, 2008, bundle branch-block had resolved.   On March 06, 2008, the patient ambulated without problems.  No SVT had  been seen during hospitalization.  He had no complaints and he was ready  for discharge home.  Please note, the patient will not return to work  until March 11, 2008, and then only 40 hours a week. He has been  working 50-60 hours a week, which he feels added to his stress.  We will  leave him at 40 hours a week and then he can re-discuss that with Dr.  Clarene Duke when he is seen in November 2009.  He will follow up at [facility].  He  is instructed to follow up with Dr. Sharene Skeans for his migraine and Dr.  Tanya Nones for general  care.      Darcella Gasman. Ingold, N.P.    ______________________________  Thereasa Solo Little, M.D.    LRI/MEDQ  D:  03/06/2008  T:  03/07/2008  Job:  [id number]   cc:   Thereasa Solo. Little, M.D.  Lidia Collum, MD  Erma Pinto, MD  Priscille Heidelberg. Pamalee Leyden, MD

## 2010-10-13 NOTE — Discharge Summary (Signed)
NAME:  Lee [name], Lee [name] NO.:  [id number]   MEDICAL RECORD NO.:  [id number]          PATIENT TYPE:  INP   LOCATION:  2917                         FACILITY:  [facility]   PHYSICIAN:  Hillis Range, MD       DATE OF BIRTH:  30-Nov-1950   DATE OF ADMISSION:  10/22/2008  DATE OF DISCHARGE:  10/23/2008                               DISCHARGE SUMMARY   CARDIOLOGIST:  Gaspar Garbe B. Little, MD   PRIMARY CARE PHYSICIAN:  Broadus John T. Pamalee Leyden, MD   ALLERGIES:  This patient has allergy to TOPROL.   TIME FOR THIS DICTATION AND EXPLANATION TO THE PATIENT:  Greater than 40  minutes.   FINAL DIAGNOSES:  1. Paroxysmal atrial fibrillation  2. No inducible SVT on EP study  3. Possible obstructive sleep apnea.   SECONDARY DIAGNOSES:  1. History of supraventricular tachycardia.  2. Coronary artery disease status post drug-eluting stent, October      2009.  3. Hypertension.  4. Dyslipidemia.  5. Degenerative joint disease.  6. Gastroesophageal reflux disease.  7. Migraines.   PROCEDURE:  Oct 22, 2008 electrophysiology study with finding of  inducible afib, without inducible SVT.  NO evidence of accessory  pathways were observed.  Though the patient had dual atrioventricular  nodal physiology, no inducible atrioventricular nodal reentrant  tachycardia was observed.  His clinical history is most consistent with  symptoms of afib refractory to medical therapy.  The patient therefore  underwent anatomical encirclement and electrical isolation of all four  pulmonary veins.  No inducible arrhythmias following ablation.   BRIEF HISTORY:  Lee [name] is a 60 year old male.  He has a history  of supraventricular tachycardia and paroxysmal atrial fibrillation.  His  past medical history also includes hypertension and coronary artery  disease.  He is status post drug-eluting stent to the LAD, October 2009.   Now presents to office visit on Oct 16, 2008 for discussion  electrophysiology  therapies for his paroxysmal atrial fibrillation.  He  continues to have episodic palpitations.  These happened several times a  week.  They last 3-4 minutes.  He does not know of any precipitating  factors or triggers.  He feels washed out following an episode.  He  denies recent chest pain, orthopnea, paroxysmal nocturnal dyspnea,  presyncope, or syncope.  He is on Coumadin and has had no difficulty  with bleeding.  He has failed prior medical therapy with Tikosyn and diltiazem.  He is  presently anticoagulated with Coumadin.  The patient has been evaluated  in the past at [facility] and has been treated there  with dofetilide and diltiazem for afib.   HOSPITAL COURSE:  The patient presents electively on Oct 22, 2008.  He  underwent electrophysiology study with inducible afib (as above).  He  underwent successful anatomical encirclement and electrical isolation of  all four pulmonary veins.  He has had no apparent complications in the  postprocedure period and he is maintaining sinus rhythm.  He required a  nasal trumpet during the procedure and postprocedure day #1 managed to  express a large clot  from the right naris which then started to bleed.  He has been applying pressure to this and it has been abating at the  time of his discharge, although not completely stopped.   DISCHARGE MEDICATIONS:  Lee [name] discharges on the following  medications:  1. Tikosyn 125 mcg 3 tablets in the morning and 3 tablets in the      evening.  2. Nasonex 50 mg as needed.  3. Cardizem LA 360 mg daily.  He is to continue this for the next 2      weeks and to stop it if he has had no recurrence of tachy      arrhythmias.  4. Micardis 20 mg tablets 1/2 tablet daily.  5. Furosemide 20 mg daily.  6. Crestor 10 mg daily at bedtime.  7. Enteric-coated aspirin 81 mg daily.  8. Fexofenadine 180 mg daily.  9. Diclofenac 75 mg daily.  10.Plavix 75 mg daily.  11.Nexium 40 mg  daily.  12.Potassium 10 mEq daily.   The patient also takes Coumadin 5 mg tablets 1-1/2 tablets daily except  for 2 tablets Monday and Friday.   FOLLOWUP:  He follows up with Dr. Johney Frame on Wednesday, January 22, 2009  at 1:45 p.m.  At that time, he would probably be able to discontinue  Tikosyn and Coumadin.   LABORATORY DATA THIS ADMISSION:  Complete blood count on the day of  discharge:  White cells 10.3, hemoglobin 12, hematocrit 35.5, and  platelets of 202.  Protime on the day of discharge 30.6 and INR 2.7.  Basic metabolic panel was taken in preparation for this procedure on Oct 16, 2008:  Sodium 137, potassium is 4, chloride 105, carbonate 26,  creatinine is 1.0, BUN is 15, and glucose 85.      Maple Mirza, [place]      Hillis Range, MD  Electronically Signed    GM/MEDQ  D:  10/23/2008  T:  10/24/2008  Job:  [id number]   cc:   Thereasa Solo. Little, M.D.  Broadus John T. Pamalee Leyden, MD

## 2010-10-13 NOTE — Telephone Encounter (Signed)
The letter is in your folder to be signed but I wanted to go ahead and inform the patient and you of Dr. Fredirick Maudlin answer. Did you want patient to wait til January for procedure when the new recall will be sent out or now and stay on Plavix?

## 2010-10-14 ENCOUNTER — Encounter (HOSPITAL_COMMUNITY): Payer: Self-pay

## 2010-10-16 ENCOUNTER — Encounter (HOSPITAL_COMMUNITY): Payer: Self-pay

## 2010-10-16 NOTE — Op Note (Signed)
NAME:  Lee [name], Lee [name] NO.:  [id number]   MEDICAL RECORD NO.:  [id number]          PATIENT TYPE:  AMB   LOCATION:  DSC                          FACILITY:  [facility]   PHYSICIAN:  Katy Fitch. Sypher, M.D. DATE OF BIRTH:  04-15-1951   DATE OF PROCEDURE:  06/30/2006  DATE OF DISCHARGE:                               OPERATIVE REPORT   PREOPERATIVE DIAGNOSIS:  Painful left thumb carpometacarpal joint  arthrosis.   POSTOPERATIVE DIAGNOSIS:  Painful left thumb carpometacarpal joint  arthrosis.   OPERATIONS:  Reconstruction of left thumb carpometacarpal joint  utilizing an autogenous palmaris longus tendon to create an  intermetacarpal ligament and suspension plasty utilizing an Arthrex  TightRope Construct with #2 FiberWire suture.   OPERATIONS:  Lee [name], M.D.   ASSISTANT:  Molly Maduro Dasnoit PA-C.   ANESTHESIA:  Infraclavicular block of left upper extremity, supplemented  by IV sedation, supervising anesthesiologist is Dr. Autumn Patty.   INDICATIONS:  Lee [name] is a 60 year old gentleman who is well  acquainted with our practice.  He has been known to have bilateral thumb  CMC arthritis.  He has reached disabling pain levels on the left.   We have had a series of lengthy informed consent visits in which we  described the various techniques of reconstructing the thumb.   We have advised them to proceed with trapeziectomy and a suspension  plasty type procedure utilizing an autogenous palmaris longus tendon  graft to create an intermetacarpal ligament with bone-tunnel technique.  We have advised him that in our judgment preferred to use a TightRope  suture construct to maintain the proper position of his carpometacarpal  joint rather than utilizing Kirschner wires as was the case in prior  arc.   Since the introduction of the TightRope, we have had excellent outcomes  avoiding the use of Kirschner wires, thereby minimizing the risks of pin-  track  infection and/or pin complications in the postoperative period.   After informed consent, Lee [name] was brought to the operating room  at this time.   Preoperatively, he was advised of the potential risks and benefits of  surgery.  Our goal was to relieve his pain and improve his pinch  function.  The risks include infection, failure of the hardware, failure  to relieve all of his pain and the possible onset of regional pain  following a thumb procedure.   Questions have been invited and answered preoperatively.   PROCEDURE:  Lee [name] is brought to the operating room and placed  in supine position on the operating room table.  Following an anesthesia  consult by Dr. Sampson Goon, an infraclavicular block was placed without  difficulty.  Complete anesthesia of left upper extremity was achieved.   Lee [name] was brought to the operating room, placed in supine  position on the operating room table, and 1 gram of Ancef administered  as an IV prophylactic antibiotic.   Lee [name] left arm and hand were prepped with Betadine soap  solution and sterilely draped with impervious arthroscopy drapes.  The  left arm and hand were exsanguinated with an Esmarch bandage, and an  arterial tourniquet on the proximal brachium inflated to 250 mmHg.  The  procedure commenced with a Wagner curvilinear incision exposing the  thenar muscles in the carpometacarpal region.  Extreme care was taken to  identify the lateral antebrachial cutaneous sensory branches and the  palmar radial sensory branches of the thumb.  These were meticulously  dissected and bluntly retracted.   The abductor pollicis longus tendon slip that was inserting on the  thenar muscles was elevated with the abductor pollicis brevis and the  flexor pollicis brevis and opponens muscle exposing the base of the  thumb metacarpal.  The capsule of the carpometacarpal joint was incised  and the trapezium exposed  subperiosteally.  The trapezium was morselized  and removed piecemeal, performing a complete synovectomy of the [facility]  joint and taking great care to preserve the flexor carpi radialis tendon  insertion distally.  After synovectomy and removal of all loose bodies,  the facette at the base of the index metacarpal was identified, and a  Kirschner wire 0.045 inches in diameter placed from the facette dorsally  to the metaphysis of the index metacarpal on its ulnar aspect.   A second incision was fashioned at this level and the end of the K-wire  identified.   An Arthrex cannulated drill was then used to create a properly sized  drill hole through the index metacarpal and likewise a drill hole  created through the base of thumb metacarpal with the 0.045-inch  Kirschner wire used as a guide pin.  The tunnels in the index metacarpal  in the thumb were enlarged at the [facility] site to allow invagination of the  palmaris longus tendon graft.   The palmaris longus was then harvested with a brand tendon stripper.  Very satisfactory tissue was recovered with a 6 mm in diameter palmaris  longus.   This wound was then irrigated and repaired with intradermal 3-0 Prolene  suture.   Attention was then directed to passage of the Arthrex TightRope device.  A custom device was used with 2 additional loops of 2-0 FiberWire, 1 on  each grommet to allow invagination of the tendon graft.   The Arthrex TightRope device was passed in standard fashion utilizing a  shortened guide pin followed by use of a Swanson suture passer to bring  the device from the dorsal aspect of the thumb metacarpal through the  base of the metacarpal into the radial aspect of the index metacarpal  and out the ulnar dorsal aspect of the index metacarpal.  The various  suture loops were portioned correctly, and the palmaris longus graft was  then used with through bone tunnels to be invaginated into the index metacarpal and proper  release secured with a 2-0 FiberWire over the  dorsal grommet followed by tensioning appropriately and use of the other  2-0 suture on the thumb metacarpal grommet.  The tails of the palmaris  longus were then wrapped 360 degrees around the intermetacarpal ligament  construct x2 for a double wrap interposing tendon between the thumb and  index metacarpals followed by taking the free tail and passing it  beneath the flexor carpi radialis and creating an overhand knot of  tendon interposition beneath the metacarpal.  The overhand knot was  secured with mattress suture of 3-0 Ethibond.   The TightRope was then appropriately tensioned to allow about 4 mm of  gap between the index and thumb metacarpals.   C-arm images were studied to be certain that we did not  over tighten the  TightRope.  The TightRope knots were then completed on the dorsal wound.  The thumb range of motion was examined and found to reveal full  abduction against the index metacarpal, flexion to bring the nail to the  distal palmar crease at the small finger metacarpal head and an  excellent span grasp.  We appeared to have an anatomic level of  suspension achieved.   The wound was then thoroughly irrigated, and the tourniquet released.  Hemostasis was achieved with bipolar cautery followed by meticulous  repair of the wounds with 4-0 Vicryl and intradermal 3-0 Prolene  segmental sutures.   There were no apparent complications.   Lee [name] tolerated the surgery and anesthesia well.   He was awakened from sedation, subsequently had his wounds dressed with  Xeroflo sterile gauze and a voluminous forearm-based thumb spica  dressing.   He will be transferred to the recovery room for observation of his vital  signs.   We anticipate discharge in the care of his family with prescriptions for  Dilaudid 2 mg 1 to 2 tablets p.o. q.4-6h. p.r.n. pain.  Also, Keflex 500  mg 1 p.o. q.8h. x4 days as prophylactic antibiotic  and Motrin 600 mg 1  p.o. q.6h. p.r.n. pain 30 tablets with 2 refills.      Katy Fitch Sypher, M.D.  Electronically Signed     RVS/MEDQ  D:  06/30/2006  T:  06/30/2006  Job:  [id number]   cc:   Katy Fitch. Sypher, M.D.

## 2010-10-16 NOTE — Assessment & Plan Note (Signed)
[facility]                             PULMONARY OFFICE NOTE   NAME:[name], [name]                    MRN:          [id number]  DATE:09/30/2006                            DOB:          05-07-1951    REFERRING PHYSICIAN:  Onalee Hua L. Annalee Genta, M.D.   SLEEP MEDICINE CONSULTATION   HISTORY OF PRESENT ILLNESS:  The patient is a 60 year old gentleman whom  I have been asked to see for obstructive sleep apnea.  The patient  underwent nocturnal polysomnogram on April 13, 2006, where he was  found to have a respiratory disturbance index of only 4 events per hour.  The lowest oxygen desaturation was only 89%.  The patient was found to  have very small numbers of periodic leg movements that did not seem to  disrupt sleep.  The patient states that he feels that he is tired all of  the time and has been this way for the last 2 years.  He has been told  that he has snoring to the point it has driven his bed partner to a  different room.  No one has ever mentioned pauses in his breathing  during sleep.  He does have snoring arousals.  The patient does use  Breathe Rite strips each night and feels that this does help.  He  typically gets to bed between 10 and 11 and gets up at 5:30 to start his  day.  He is not rested upon arising.  The patient does have some  excessive daytime sleepiness, but primarily complains of tiredness and  fatigue.  He describes only occasional sleep pressure during the day  with periods of inactivity and only has rare dozing in the evening with  TV or movies.  He has rare difficulties with driving.  He has no  symptoms that are suggestive of a daytime sleep disorder, such as  narcolepsy or idiopathic hypersomnia.  The patient does state that he  has gained 5 to 10 pounds over the last 2 years.   PAST MEDICAL HISTORY:  1. Significant for hypertension.  2. History of dyslipidemia.  3. History of allergic rhinitis.  4. History of  spine surgery.  5. History of sinus surgery in 2001.  6. History of arrhythmias.   The patient has no known drug allergies.   CURRENT MEDICATIONS:  1. Allegra 180 daily.  2. Nasonex 2 sprays in each naris daily.  3. Protonix 40 mg daily.  4. Tikosyn 250 b.i.d.  5. Taztia XT 360 daily.  6. Lisinopril 20 mg daily.  7. Crestor 10 mg daily.  8. Aspirin 325 daily.  9. Imitrex 100 mg p.r.n.   SOCIAL HISTORY:  He is married and has children.  He has not smoked  since 1989.   FAMILY HISTORY:  Remarkable for his father and sister having heart  disease, mother having had cancer.   REVIEW OF SYSTEMS:  As per the history of present illness.  Also, see  patient intake form documented in the chart.   PHYSICAL EXAM:  GENERAL:  He is an overweight male in no acute distress.  Blood pressure 116/72, pulse 71, temperature 98.1, weight 245 pounds, O2  saturation on room air is 95%.  HEENT:  Pupils are equal, round, and reactive to light and  accommodation.  Extraocular muscles are intact.  Nares are patent with  no discharge.  Oropharynx is clear with fairly normal uvula and palate.  NECK:  Supple without JVD or lymphadenopathy.  There is no palpable  thyromegaly.  CHEST:  Totally clear.  CARDIAC:  Reveals regular rate and rhythm with no murmurs, rubs, or  gallops.  ABDOMEN:  Soft, nontender.  Normoactive bowel sounds.  GENITAL, RECTAL, AND BREASTS:  Not done and not indicated.  LOWER EXTREMITIES:  Without edema.  Good pulses distally.  No calf  tenderness.  NEUROLOGIC:  Alert and oriented with no obvious motor deficits.   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  The patient  does have a history of loud snoring, non-restorative sleep, and  tiredness and fatigue during the day.  Excessive daytime sleepiness is  really not bothering his quality of life to any great extent currently.  The patient is clearly overweight and there is not a lot of abnormality  in his nasopharynx.   I suspect this is more of a problem with base of  tongue.  At this point in time, I have discussed with him upper airway  resistance syndrome and how it is a pre sleep apnea condition that we  sometimes treat if patients are symptomatic enough.  He can certainly  work on weight loss alone, consider oral appliance, and also continuous  positive airway pressure.  I have discussed all this with him and  answered all of his questions.   PLAN:  1. Work aggressively on weight loss.  2. The patient is to go home and think about our discussion and talk      with his family, and decide whether he wants to try an oral      appliance or possibly consider CPAP if we can get it paid for      through insurance company, which is difficult sometimes.  I have      asked him to [employer] oral appliance on the Internet and to call me      if he has any questions about it, or if he would like to consider      it.  The patient is to get back with me over the next few weeks to      let me know if he would like to pursue weight loss alone versus      oral appliance, versus trying to get CPAP certified.     Barbaraann Share, MD,FCCP  Electronically Signed    KMC/MedQ  DD: 10/15/2006  DT: 10/15/2006  Job #: [id number]   cc:   Onalee Hua L. Annalee Genta, M.D.  Ernestina Penna, M.D.  Thereasa Solo. Little, M.D.

## 2010-10-16 NOTE — Op Note (Signed)
NAME:  RIKI, Lee [name] NO.:  [id number]   MEDICAL RECORD NO.:  [id number]          PATIENT TYPE:  OIB   LOCATION:  NA                           FACILITY:  [facility]   PHYSICIAN:  Darlin Priestly, MD  DATE OF BIRTH:  [birth year]   DATE OF PROCEDURE:  12/25/2004  DATE OF DISCHARGE:                                 OPERATIVE REPORT   PROCEDURE:  Insertion of a Medtronic Reveal Plus, Model [id number], loop  recorder, Serial #[id number].   ATTENDING:  Lenise Herald, M.D.   COMPLICATIONS:  None.   INDICATIONS:  Mr. Alonso is a 60 year old male patient of Dr. Caprice Kluver,  Dr. Jari Pigg, with a history of hypertension, hyperlipidemia.  He has  recently complained of recurrent presyncopal episodes that are lasting  several weeks to months between episodes.  He has had no chest pain with  these episodes.  He is now referred for loop recorder insertion in an  attempt to capture significant arrhythmia.   DESCRIPTION OF APPROACH:  After informed consent, patient brought to the  Cardiac Cath Lab in a fasting state.  The left anterior chest wall was then  prepped and draped in sterile fashion.  Lidocaine 1% was then used to  anesthetize approximately 2 cm beneath the mid clavicle and 2 cm medial to  left sternal border.  Next, approximately a 2 cm horizontal incision was  then performed and this was carried down to the left pectoral fascia.  Approximately a 2 x 4 cm pocket was then created over the left pectoral  fascia and hemostasis was obtained with electrocautery.  Two silk sutures  were then placed __________ pocket.  The pocket was then irrigated with 1%  kanamycin solution.  Hemostasis was obtained.  The Medtronic Reveal Plus  loop recorder, Model [id number], Serial [id number], was then inserted into the  pocket.  The sutures were then fashioned to the header and hemostasis was  again confirmed.  The subcutaneous layer was then closed using running 2-0  Vicryl.  The skin  layer was then closed using running 4-0 Vicryl.  Steri-  Strips were applied.  The patient was then transferred to the recovery room  in stable condition.   CONCLUSION:  Successful implant of a Medtronic Reveal Plus, Model [id number],  Serial [id number], loop recorder.       RHM/MEDQ  D:  12/25/2004  T:  12/25/2004  Job:  [id number]   cc:   Thereasa Solo. Little, M.D.  [address]  [address]  [place]  [place] [address]  Fax: [phone]   Dorene Grebe, M.D.  [place], [place] [address]

## 2010-10-19 ENCOUNTER — Encounter (HOSPITAL_COMMUNITY): Payer: Self-pay

## 2010-10-19 ENCOUNTER — Encounter: Payer: Self-pay | Admitting: Internal Medicine

## 2010-10-21 ENCOUNTER — Encounter (HOSPITAL_COMMUNITY): Payer: Self-pay

## 2010-10-23 ENCOUNTER — Other Ambulatory Visit: Payer: Self-pay | Admitting: Internal Medicine

## 2010-10-23 ENCOUNTER — Other Ambulatory Visit: Payer: 59

## 2010-10-23 ENCOUNTER — Encounter (HOSPITAL_COMMUNITY): Payer: Self-pay

## 2010-10-23 LAB — HEMOCCULT SLIDES (X 3 CARDS)
Fecal Occult Blood: NEGATIVE
OCCULT 1: NEGATIVE
OCCULT 2: NEGATIVE
OCCULT 3: NEGATIVE
OCCULT 4: NEGATIVE
OCCULT 5: NEGATIVE

## 2010-10-26 ENCOUNTER — Encounter (HOSPITAL_COMMUNITY): Payer: Self-pay

## 2010-10-28 ENCOUNTER — Encounter (HOSPITAL_COMMUNITY): Payer: Self-pay

## 2010-10-30 ENCOUNTER — Encounter (HOSPITAL_COMMUNITY): Payer: Self-pay

## 2010-11-02 ENCOUNTER — Encounter (HOSPITAL_COMMUNITY): Payer: Self-pay

## 2010-11-04 ENCOUNTER — Encounter (HOSPITAL_COMMUNITY): Payer: Self-pay

## 2010-11-06 ENCOUNTER — Encounter (HOSPITAL_COMMUNITY): Payer: Self-pay

## 2011-03-01 LAB — CARDIAC PANEL(CRET KIN+CKTOT+MB+TROPI)
CK, MB: 2
CK, MB: 2.1
CK, MB: 3.5
Relative Index: INVALID
Total CK: 139
Total CK: 71
Total CK: 71
Troponin I: 0.01
Troponin I: 0.02
Troponin I: 0.03

## 2011-03-01 LAB — COMPREHENSIVE METABOLIC PANEL
ALT: 25
AST: 26
Albumin: 3.9
BUN: 11
Creatinine, Ser: 0.82
GFR calc non Af Amer: >60
Glucose, Bld: 89
Potassium: 4.1
Total Bilirubin: 0.4
Total Protein: 6

## 2011-03-01 LAB — CK TOTAL AND CKMB (NOT AT ARMC)
CK, MB: 4.4 — ABNORMAL HIGH
Relative Index: 2.7 — ABNORMAL HIGH
Total CK: 163

## 2011-03-01 LAB — CBC
HCT: 41
Hemoglobin: 13.6
Hemoglobin: 14.2
MCHC: 33.2
MCHC: 33.5
MCV: 87.8
Platelets: 209
Platelets: 219
Platelets: 239
RBC: 4.67
RDW: 12.7
RDW: 12.8
WBC: 5.7

## 2011-03-01 LAB — BASIC METABOLIC PANEL
Calcium: 8.7
GFR calc Af Amer: >60
GFR calc non Af Amer: >60
Potassium: 4
Sodium: 138

## 2011-03-01 LAB — DIFFERENTIAL
Eosinophils Absolute: 0.1
Eosinophils Relative: 2
Lymphs Abs: 1.8

## 2011-03-01 LAB — TROPONIN I: Troponin I: 0.01

## 2011-03-01 LAB — MAGNESIUM: Magnesium: 2

## 2012-02-14 ENCOUNTER — Encounter: Payer: Self-pay | Admitting: Gastroenterology

## 2012-03-06 ENCOUNTER — Encounter: Payer: Self-pay | Admitting: Gastroenterology

## 2012-04-25 ENCOUNTER — Other Ambulatory Visit: Payer: Self-pay | Admitting: Family Medicine

## 2012-04-25 DIAGNOSIS — J329 Chronic sinusitis, unspecified: Secondary | ICD-10-CM

## 2012-04-26 ENCOUNTER — Ambulatory Visit
Admission: RE | Admit: 2012-04-26 | Discharge: 2012-04-26 | Disposition: A | Discharge status: Home / Self Care | Payer: 59 | Source: Ambulatory Visit | Attending: Family Medicine | Admitting: Family Medicine

## 2012-04-26 DIAGNOSIS — J329 Chronic sinusitis, unspecified: Secondary | ICD-10-CM

## 2012-05-01 ENCOUNTER — Encounter: Payer: Self-pay | Admitting: Physician Assistant

## 2012-05-01 ENCOUNTER — Telehealth: Payer: Self-pay | Admitting: *Deleted

## 2012-05-01 ENCOUNTER — Ambulatory Visit (INDEPENDENT_AMBULATORY_CARE_PROVIDER_SITE_OTHER): Payer: 59 | Admitting: Physician Assistant

## 2012-05-01 ENCOUNTER — Other Ambulatory Visit: Payer: Self-pay | Admitting: *Deleted

## 2012-05-01 VITALS — BP 124/76 | HR 68 | Ht 69.0 in | Wt 252.2 lb

## 2012-05-01 DIAGNOSIS — Z1211 Encounter for screening for malignant neoplasm of colon: Secondary | ICD-10-CM

## 2012-05-01 DIAGNOSIS — Z7901 Long term (current) use of anticoagulants: Secondary | ICD-10-CM

## 2012-05-01 MED ORDER — MOVIPREP 100 G PO SOLR: 1.0000 | Freq: Once | ORAL | Status: AC

## 2012-05-01 NOTE — Progress Notes (Signed)
Reviewed and agree with management plans.  Malcolm T. Stark MD FACG  

## 2012-05-01 NOTE — Telephone Encounter (Signed)
05/01/2012    RE: Lee [name] DOB: 09/13/1950 MRN: [id number]   Dear Dr. Nicki Guadalajara,    We have scheduled the above patient for an endoscopic procedure. Our records show that he is on anticoagulation therapy.   Please advise as to how long the patient may come off his therapy of Plavix prior to the procedure, which is scheduled for 06-20-2012. If approved by the patient's cardiologist, we normally have the patient hold the Plavix for 5 days.  Please advise and fax back/ or route the completed form to [facility] at [phone].   Sincerely,    Ashby Dawes     Amy Esterwood PA-C

## 2012-05-01 NOTE — Progress Notes (Signed)
Subjective:    Patient ID: Lee [name], male    DOB: 1951/04/22, 61 y.o.   MRN: [id number]  HPI Lee [name] is a pleasant 61 year old white male known to Dr. Russella Dar, who comes in today to discuss followup colonoscopy. He last had colonoscopy in 2002, and this was negative for polyps he did have left-sided diverticulosis and internal hemorrhoids. He had endoscopy done in 2004 and this was a normal exam. The patient does have history of atrial fibrillation and has had ablation, he also has history of coronary artery disease and is status post PCI to the RCA and LAD in January 2012. He has been maintained on Plavix and aspirin. He also has hypertension GERD and COPD., Last echo done in 2010 showed an EF of 60-65%. Patient currently has no GI complaints, specifically denies any abdominal pain change in his bowel habits melena or hematochezia. He had been seen about a year ago and his procedure was delayed due to the recent stents and he comes in now to reschedule. He has had no interval cardiac issues.    Review of Systems  Constitutional: Negative.   HENT: Negative.   Eyes: Negative.   Respiratory: Negative.   Cardiovascular: Negative.   Gastrointestinal: Negative.   Genitourinary: Negative.   Musculoskeletal: Negative.   Neurological: Negative.   Hematological: Negative.   Psychiatric/Behavioral: Negative.    Outpatient Prescriptions Prior to Visit  Medication Sig Dispense Refill  . aspirin 325 MG EC tablet Take 325 mg by mouth daily.        Marland Kitchen BYSTOLIC 5 MG tablet Take 1 tablet by mouth daily.       Marland Kitchen CIALIS 20 MG tablet Take 1 tablet by mouth daily as needed.       . CRESTOR 10 MG tablet Take 1 tablet by mouth at bedtime.       . fluticasone (FLONASE) 50 MCG/ACT nasal spray 2 sprays by Nasal route at bedtime.       . furosemide (LASIX) 40 MG tablet Take 1 tablet by mouth daily.       Marland Kitchen HYDROcodone-acetaminophen (VICODIN) 5-500 MG per tablet Take 1 tablet by mouth as needed. Takes 1-2 for  migraines      . MICARDIS 40 MG tablet Take 1 tablet by mouth daily.       . nitroGLYCERIN (NITROSTAT) 0.3 MG SL tablet Place 0.4 mg under the tongue every 5 (five) minutes as needed. Chest pain       . pantoprazole (PROTONIX) 40 MG tablet Take 1 tablet by mouth daily.       Marland Kitchen PLAVIX 75 MG tablet Take 1 tablet by mouth daily.       . potassium chloride (MICRO-K) 10 MEQ CR capsule Take 1 capsule by mouth daily.            Allergies  Allergen Reactions  . Toprol Xl (Metoprolol Succinate) Other (See Comments)    Personality change   Patient Active Problem List  Diagnosis  . DYSLIPIDEMIA  . HYPERTENSION  . CAD  . RBBB  . ATRIAL FIBRILLATION  . SUPRAVENTRICULAR TACHYCARDIA  . ALLERGIC RHINITIS  . GERD  . BACK PAIN  . PALPITATIONS  . ARRHYTHMIA, HX OF  . MIGRAINES, HX OF   History  Substance Use Topics  . Smoking status: Former Smoker    Quit date: 06/01/1987  . Smokeless tobacco: Never Used  . Alcohol Use: Yes     Comment: once every 6-7 months   Family History  Problem  Relation Age of Onset  . Heart disease Father     and sister  . Diabetes Father   . Colon cancer Mother     mets from uterine  . Uterine cancer Mother     Objective:   Physical Exam well-developed white male in no acute distress, pleasant blood pressure 124/76 pulse 68 height 5 foot 9 weight 252. HEENT; nontraumatic normocephalic EOMI PERRLA sclera anicteric, Neck; supple no JVD, Cardiovascular; regular rate and rhythm with S1-S2 no murmur or gallop, Pulmonary; clear bilaterally, Abdomen; large soft nontender nondistended bowel sounds active there is no palpable mass or hepatosplenomegaly, Rectal; exam not done, Extremities; no clubbing cyanosis or edema skin warm and dry, Psych; mood and affect normal and appropriate.        Assessment & Plan:  #51 61 year old male due for followup screening colonoscopy-asymptomatic and negative colonoscopy 2002 #2 chronic antiplatelet therapy with Plavix and  aspirin #3 history of coronary artery disease status post stent times 06/01/2010 #4 history of atrial fibrillation status post ablation #5 hypertension #6 COPD #7 GERD-controlled on Protonix  Plan; patient is scheduled for colonoscopy with Dr. Russella Dar in January 2014 -procedure was discussed in detail with the patient and he is agreeable to proceed. He will need to come off of his Plavix for 5 days prior to the procedure and we will obtain consent from Dr. Tresa Endo his cardiologist.

## 2012-05-01 NOTE — Patient Instructions (Addendum)
You have been scheduled for a colonoscopy with propofol. Please follow written instructions given to you at your visit today.  We sent the prescription for the prep kit to [facility] [address] Please pick up your prep kit at the pharmacy within the next 1-3 days. If you use inhalers (even only as needed) or a CPAP machine, please bring them with you on the day of your procedure. We will cal you after we hear from Dr Tresa Endo regarding the Plavix medication.

## 2012-05-02 ENCOUNTER — Telehealth: Payer: Self-pay | Admitting: *Deleted

## 2012-05-02 NOTE — Telephone Encounter (Signed)
Letter sent to Dr. Tresa Endo, [facility].

## 2012-05-03 ENCOUNTER — Telehealth: Payer: Self-pay | Admitting: *Deleted

## 2012-05-03 NOTE — Telephone Encounter (Signed)
Called pt to inform him Dr. Tresa Endo responded to our Plavix letter.  I advised pt to stop the Plavix on 06-15-2012 and resume it day after procedure ( scheduled for 06-20-2012) on 06-21-2012. Pt understood instructions.

## 2012-05-03 NOTE — Telephone Encounter (Signed)
Called the patient to advise him Dr. Tresa Endo responded our Plavix letter.  Dr. Tresa Endo agreed that the patient  Can hold the Plavix for 5 days prior to the procedure date of 06-20-2012.  I told him to stop the Plavix on 06-15-2012 and resume it the day after the procedure on 06-21-2012.

## 2012-06-20 ENCOUNTER — Encounter: Payer: Self-pay | Admitting: Gastroenterology

## 2012-06-20 ENCOUNTER — Ambulatory Visit (AMBULATORY_SURGERY_CENTER): Payer: 59 | Admitting: Gastroenterology

## 2012-06-20 VITALS — BP 132/83 | HR 64 | Temp 97.5°F | Resp 18 | Ht 69.0 in | Wt 252.0 lb

## 2012-06-20 DIAGNOSIS — Z1211 Encounter for screening for malignant neoplasm of colon: Secondary | ICD-10-CM

## 2012-06-20 MED ORDER — SODIUM CHLORIDE 0.9 % IV SOLN: 500.0000 mL | INTRAVENOUS | Status: DC

## 2012-06-20 NOTE — Patient Instructions (Addendum)
Discharge instructions given with verbal understanding. Handouts on diverticusosis and a high fiber diet diet given. Resume previous medications. YOU HAD AN ENDOSCOPIC PROCEDURE TODAY AT THE [facility]: Refer to the procedure report that was given to you for any specific questions about what was found during the examination.  If the procedure report does not answer your questions, please call your gastroenterologist to clarify.  If you requested that your care partner not be given the details of your procedure findings, then the procedure report has been included in a sealed envelope for you to review at your convenience later.  YOU SHOULD EXPECT: Some feelings of bloating in the abdomen. Passage of more gas than usual.  Walking can help get rid of the air that was put into your GI tract during the procedure and reduce the bloating. If you had a lower endoscopy (such as a colonoscopy or flexible sigmoidoscopy) you may notice spotting of blood in your stool or on the toilet paper. If you underwent a bowel prep for your procedure, then you may not have a normal bowel movement for a few days.  DIET: Your first meal following the procedure should be a light meal and then it is ok to progress to your normal diet.  A half-sandwich or bowl of soup is an example of a good first meal.  Heavy or fried foods are harder to digest and may make you feel nauseous or bloated.  Likewise meals heavy in dairy and vegetables can cause extra gas to form and this can also increase the bloating.  Drink plenty of fluids but you should avoid alcoholic beverages for 24 hours.  ACTIVITY: Your care partner should take you home directly after the procedure.  You should plan to take it easy, moving slowly for the rest of the day.  You can resume normal activity the day after the procedure however you should NOT DRIVE or use heavy machinery for 24 hours (because of the sedation medicines used during the test).    SYMPTOMS  TO REPORT IMMEDIATELY: A gastroenterologist can be reached at any hour.  During normal business hours, 8:30 AM to 5:00 PM Monday through Friday, call [phone].  After hours and on weekends, please call the GI answering service at [phone] who will take a message and have the physician on call contact you.   Following lower endoscopy (colonoscopy or flexible sigmoidoscopy):  Excessive amounts of blood in the stool  Significant tenderness or worsening of abdominal pains  Swelling of the abdomen that is new, acute  Fever of 100F or higher  FOLLOW UP: If any biopsies were taken you will be contacted by phone or by letter within the next 1-3 weeks.  Call your gastroenterologist if you have not heard about the biopsies in 3 weeks.  Our staff will call the home number listed on your records the next business day following your procedure to check on you and address any questions or concerns that you may have at that time regarding the information given to you following your procedure. This is a courtesy call and so if there is no answer at the home number and we have not heard from you through the emergency physician on call, we will assume that you have returned to your regular daily activities without incident.  SIGNATURES/CONFIDENTIALITY: You and/or your care partner have signed paperwork which will be entered into your electronic medical record.  These signatures attest to the fact that that the information above  on your After Visit Summary has been reviewed and is understood.  Full responsibility of the confidentiality of this discharge information lies with you and/or your care-partner.

## 2012-06-20 NOTE — Progress Notes (Signed)
Patient did not experience any of the following events: a burn prior to discharge; a fall within the facility; wrong site/side/patient/procedure/implant event; or a hospital transfer or hospital admission upon discharge from the facility. (G8907) Patient did not have preoperative order for IV antibiotic SSI prophylaxis. (G8918)  

## 2012-06-20 NOTE — Op Note (Signed)
[facility] [address] [place], [address]   COLONOSCOPY PROCEDURE REPORT  PATIENT: Lee [name], Lee [name]  MR#: [id number] BIRTHDATE: [birth year] , 61  yrs. old GENDER: Male ENDOSCOPIST: Meryl Dare, MD, [facility] PROCEDURE DATE:  06/20/2012 PROCEDURE:   Colonoscopy, screening ASA CLASS:   Class II INDICATIONS:average risk screening. MEDICATIONS: MAC sedation, administered by CRNA and propofol (Diprivan) 150mg  IV DESCRIPTION OF PROCEDURE:   After the risks benefits and alternatives of the procedure were thoroughly explained, informed consent was obtained.  A digital rectal exam revealed no abnormalities of the rectum.   The LB CF-[id number]  endoscope was introduced through the anus and advanced to the cecum, which was identified by both the appendix and ileocecal valve. No adverse events experienced.   The quality of the prep was adequate, using MoviPrep  The instrument was then slowly withdrawn as the colon was fully examined.   COLON FINDINGS: Mild diverticulosis was noted in the descending colon and sigmoid colon. The colon was otherwise normal appearing. Retroflexed views revealed no abnormalities. The time to cecum=1 minutes 34 seconds.  Withdrawal time=10 minutes 04 seconds.  The scope was withdrawn and the procedure completed.  COMPLICATIONS: There were no complications.  ENDOSCOPIC IMPRESSION: 1. Mild diverticulosis was noted in the descending colon and sigmoid colon  RECOMMENDATIONS: 1.  High fiber diet with liberal fluid intake. 2. Colonoscopy in 10 years for routine colorectal cancer screening   eSigned:  Meryl Dare, MD, [facility] 06/20/2012 9:44 AM   cc: Lynnea Ferrier, MD

## 2012-06-20 NOTE — Progress Notes (Signed)
Lidocaine-40mg IV prior to Propofol InductionPropofol given over incremental dosages 

## 2012-06-21 ENCOUNTER — Telehealth: Payer: Self-pay

## 2012-06-21 NOTE — Telephone Encounter (Signed)
  Follow up Call-  Call back number 06/20/2012  Post procedure Call Back phone  # [phone]  Permission to leave phone message Yes     Patient questions:  Do you have a fever, pain , or abdominal swelling? no Pain Score  0 *  Have you tolerated food without any problems? yes  Have you been able to return to your normal activities? yes  Do you have any questions about your discharge instructions: Diet   no Medications  no Follow up visit  yes  Do you have questions or concerns about your Care? no  Actions: * If pain score is 4 or above: No action needed, pain <4.

## 2012-07-18 ENCOUNTER — Encounter: Payer: Self-pay | Admitting: Family Medicine

## 2012-07-26 ENCOUNTER — Other Ambulatory Visit (HOSPITAL_COMMUNITY): Payer: Self-pay | Admitting: Cardiology

## 2012-07-26 DIAGNOSIS — I2510 Atherosclerotic heart disease of native coronary artery without angina pectoris: Secondary | ICD-10-CM

## 2012-07-28 ENCOUNTER — Ambulatory Visit (HOSPITAL_COMMUNITY)
Admission: RE | Admit: 2012-07-28 | Discharge: 2012-07-28 | Disposition: A | Discharge status: Home / Self Care | Payer: 59 | Source: Ambulatory Visit | Attending: Cardiovascular Disease | Admitting: Cardiovascular Disease

## 2012-07-28 DIAGNOSIS — I2510 Atherosclerotic heart disease of native coronary artery without angina pectoris: Secondary | ICD-10-CM

## 2012-07-28 MED ORDER — TECHNETIUM TC 99M SESTAMIBI GENERIC - CARDIOLITE
10.0000 | Freq: Once | INTRAVENOUS | Status: AC | PRN
  Administered 2012-07-28: 10 via INTRAVENOUS

## 2012-07-28 MED ORDER — TECHNETIUM TC 99M SESTAMIBI GENERIC - CARDIOLITE
30.0000 | Freq: Once | INTRAVENOUS | Status: AC | PRN
  Administered 2012-07-28: 30 via INTRAVENOUS

## 2012-07-28 NOTE — Procedures (Addendum)
[facility] [address] [place] [address] [place] [address] [phone]  Cardiology Nuclear Med Study  Lee [name] is a 62 y.o. male     MRN : [id number]     DOB: [birth year]  Procedure Date: 07/28/2012  Nuclear Med Background Indication for Stress Test:  Stent Patency and PTCA Patency History:  CAD;PTCA/STENT-06/08/2010 Cardiac Risk Factors: Family History - CAD, History of Smoking, Hypertension, Lipids, Obesity and RBBB  Symptoms:  Chest Pain, DOE, Fatigue and Light-Headedness   Nuclear Pre-Procedure Caffeine/Decaff Intake:  7:00pm NPO After: 5:00am   IV Site: R Antecubital  IV 0.9% NS with Angio Cath:  22g  Chest Size (in):  48" IV Started by: Emmit Pomfret, RN  Height: 5\' 8"  (1.727 m)  Cup Size: n/a  BMI:  Body mass index is 38.78 kg/(m^2). Weight:  255 lb (115.667 kg)   Tech Comments:  N/A    Nuclear Med Study 1 or 2 day study: 1 day  Stress Test Type:  Stress  Order Authorizing Provider:  Benny Lennert    Resting Radionuclide: Technetium 61m Sestamibi  Resting Radionuclide Dose: 10.1 mCi   Stress Radionuclide:  Technetium 86m Sestamibi  Stress Radionuclide Dose: 31.2 mCi           Stress Protocol Rest HR: 77 Stress HR:144  Rest BP: 137/90 Stress BP: 181/78  Exercise Time (min): 9:01 METS: 10.10          Dose of Adenosine (mg):  n/a Dose of Lexiscan: n/a mg  Dose of Atropine (mg): n/a Dose of Dobutamine: n/a mcg/kg/min (at max HR)  Stress Test Technologist: Ernestene Mention, CCT Nuclear Technologist: Koren Shiver, CNMT   Rest Procedure:  Myocardial perfusion imaging was performed at rest 45 minutes following the intravenous administration of Technetium 88m Sestamibi. Stress Procedure:  The patient performed treadmill exercise using a Bruce  Protocol for 9 minutes and 1 second. The patient stopped due to shortness of breath and fatigue. Patient denied any chest pain.  There were no significant ST-T wave changes.   Technetium 19m Sestamibi was injected at peak exercise and myocardial perfusion imaging was performed after a brief delay.  Transient Ischemic Dilatation (Normal <1.22):  0.85  Lung/Heart Ratio (Normal <0.45):  0.41 QGS EDV:  64  ml QGS ESV:  16 ml LV Ejection Fraction: 75%     Rest ECG: NSR-RBBB  Stress ECG: No significant change from baseline ECG  QPS Raw Data Images:  Normal; no motion artifact; normal heart/lung ratio. Stress Images:  There is a very small, mild apical perfusion defect and a small, medium intensity inferoseptal perfusion defect Rest Images:  Comparison with the stress images reveals no significant change. Subtraction (SDS):  No evidence of ischemia.  Impression Exercise Capacity:  Good exercise capacity. BP Response:  Normal blood pressure response. Clinical Symptoms:  No significant symptoms noted. ECG Impression:  No significant ST segment change suggestive of ischemia. Comparison with Prior Nuclear Study: No significant change from previous study. Direct image comparison shows no difference from 2012. Overall Impression:  Low risk stress nuclear study.  LV Wall Motion:  NL LV Function; NL Wall Motion   [name], MD  07/28/2012 9:53 AM

## 2012-09-04 ENCOUNTER — Ambulatory Visit (INDEPENDENT_AMBULATORY_CARE_PROVIDER_SITE_OTHER): Payer: 59 | Admitting: Family Medicine

## 2012-09-04 ENCOUNTER — Encounter: Payer: Self-pay | Admitting: Family Medicine

## 2012-09-04 VITALS — BP 126/70 | HR 70 | Temp 98.7°F | Resp 18 | Wt 258.0 lb

## 2012-09-04 DIAGNOSIS — J209 Acute bronchitis, unspecified: Secondary | ICD-10-CM

## 2012-09-04 MED ORDER — DOXYCYCLINE HYCLATE 100 MG PO TABS: 100.0000 mg | ORAL_TABLET | Freq: Two times a day (BID) | ORAL | Status: DC

## 2012-09-04 MED ORDER — HYDROCODONE-HOMATROPINE 5-1.5 MG/5ML PO SYRP: 5.0000 mL | ORAL_SOLUTION | ORAL | Status: DC | PRN

## 2012-09-04 NOTE — Progress Notes (Signed)
Subjective:     Patient ID: Lee [name], male   DOB: Mar 04, 1951, 62 y.o.   MRN: [id number]  HPI 1 week history of cough and congestion.  No fevers, chills, sore throat, sinus pain.  Has some rhinorrhea.  Cough is non productive and irritating more than anything else. Past Medical History  Diagnosis Date  . Arthritis   . Hyperlipidemia   . Hypertension   . COPD (chronic obstructive pulmonary disease)   . PVC's (premature ventricular contractions)   . Paroxysmal atrial fibrillation     s/p afib ablation 10/22/08  . CAD (coronary artery disease)     PCI RCA and LAD 1/12  . DJD (degenerative joint disease)   . Migraines   . Iron deficiency anemia   . Esophagitis 1991    grade 1  . Diverticulosis   . Hemorrhoids   . GERD (gastroesophageal reflux disease)     Hiatal hernia  . Allergic rhinitis     chronic sinusitis  . Cataract     has had lasik surg   Current Outpatient Prescriptions on File Prior to Visit  Medication Sig Dispense Refill  . aspirin 81 MG tablet Take 81 mg by mouth daily.      Marland Kitchen BYSTOLIC 5 MG tablet Take 1 tablet by mouth daily.       Marland Kitchen CIALIS 20 MG tablet Take 1 tablet by mouth daily as needed.       . citalopram (CELEXA) 40 MG tablet       . CRESTOR 10 MG tablet Take 1 tablet by mouth at bedtime.       . fluticasone (FLONASE) 50 MCG/ACT nasal spray 2 sprays by Nasal route at bedtime.       . furosemide (LASIX) 40 MG tablet Take 1 tablet by mouth daily.       Marland Kitchen HYDROcodone-acetaminophen (VICODIN) 5-500 MG per tablet Take 1 tablet by mouth as needed. Takes 1-2 for migraines      . MICARDIS 40 MG tablet Take 1 tablet by mouth daily.       . nitroGLYCERIN (NITROSTAT) 0.3 MG SL tablet Place 0.4 mg under the tongue every 5 (five) minutes as needed. Chest pain       . pantoprazole (PROTONIX) 40 MG tablet Take 1 tablet by mouth daily.       Marland Kitchen PLAVIX 75 MG tablet Take 1 tablet by mouth daily.       . potassium chloride (MICRO-K) 10 MEQ CR capsule Take 1 capsule by  mouth daily.        No current facility-administered medications on file prior to visit.   History   Social History  . Marital Status: Married    Spouse Name: N/A    Number of Children: 2  . Years of Education: N/A   Occupational History  . retired    Social History Main Topics  . Smoking status: Former Smoker    Quit date: 06/01/1987  . Smokeless tobacco: Never Used  . Alcohol Use: Yes     Comment: once every 6-7 months  . Drug Use: No  . Sexually Active: Not on file   Other Topics Concern  . Not on file   Social History Narrative  . No narrative on file     Review of Systems  Constitutional: Negative for fever, chills, activity change, appetite change and fatigue.  HENT: Positive for congestion, rhinorrhea and postnasal drip. Negative for ear pain, nosebleeds, sneezing, neck pain, tinnitus and ear discharge.  Eyes: Negative.   Respiratory: Positive for cough. Negative for choking, chest tightness, shortness of breath, wheezing and stridor.   Cardiovascular: Negative.   Gastrointestinal: Negative.   Genitourinary: Negative.        Objective:   Physical Exam  Constitutional: He appears well-developed and well-nourished.  HENT:  Head: Normocephalic.  Right Ear: External ear normal.  Left Ear: External ear normal.  Nose: Nose normal.  Mouth/Throat: Oropharynx is clear and moist. No oropharyngeal exudate.  Eyes: Conjunctivae and EOM are normal. Pupils are equal, round, and reactive to light.  Neck: Normal range of motion. Neck supple. No thyromegaly present.  Cardiovascular: Normal rate and regular rhythm.  Exam reveals gallop.   Murmur heard. Pulmonary/Chest: Effort normal and breath sounds normal. No respiratory distress. He has no wheezes. He has no rales.  Abdominal: Soft. Bowel sounds are normal. He exhibits no distension. There is no tenderness. There is no rebound.  Lymphadenopathy:    He has no cervical adenopathy.       Assessment:     Viral  bronchitis Cough    Plan:     Explained this is viral.  Asked for tincture of time.  Gave hycodan 5 mL PO Q 4 hrs prn cough.   Did give doxycycline 100 bid for 10 days but instructed him to fill ONLY if new fever, productive cough, worsening SOB.

## 2012-10-10 ENCOUNTER — Other Ambulatory Visit: Payer: Self-pay | Admitting: *Deleted

## 2012-10-10 MED ORDER — TELMISARTAN 40 MG PO TABS: 40.0000 mg | ORAL_TABLET | Freq: Every day | ORAL | Status: DC

## 2012-10-10 NOTE — Telephone Encounter (Signed)
Refill for micardis to [facility]

## 2012-11-04 ENCOUNTER — Other Ambulatory Visit: Payer: Self-pay | Admitting: Cardiovascular Disease

## 2012-11-06 ENCOUNTER — Ambulatory Visit (INDEPENDENT_AMBULATORY_CARE_PROVIDER_SITE_OTHER): Payer: 59 | Admitting: Cardiovascular Disease

## 2012-11-06 ENCOUNTER — Encounter: Payer: Self-pay | Admitting: Cardiovascular Disease

## 2012-11-06 VITALS — BP 128/86 | HR 70 | Ht 68.0 in | Wt 245.8 lb

## 2012-11-06 DIAGNOSIS — R400 Somnolence: Secondary | ICD-10-CM

## 2012-11-06 DIAGNOSIS — I4510 Unspecified right bundle-branch block: Secondary | ICD-10-CM

## 2012-11-06 DIAGNOSIS — I2510 Atherosclerotic heart disease of native coronary artery without angina pectoris: Secondary | ICD-10-CM

## 2012-11-06 DIAGNOSIS — E785 Hyperlipidemia, unspecified: Secondary | ICD-10-CM

## 2012-11-06 DIAGNOSIS — I480 Paroxysmal atrial fibrillation: Secondary | ICD-10-CM

## 2012-11-06 DIAGNOSIS — R002 Palpitations: Secondary | ICD-10-CM

## 2012-11-06 DIAGNOSIS — I4891 Unspecified atrial fibrillation: Secondary | ICD-10-CM

## 2012-11-06 DIAGNOSIS — R404 Transient alteration of awareness: Secondary | ICD-10-CM

## 2012-11-06 DIAGNOSIS — R5383 Other fatigue: Secondary | ICD-10-CM | POA: Insufficient documentation

## 2012-11-06 DIAGNOSIS — Z79899 Other long term (current) drug therapy: Secondary | ICD-10-CM

## 2012-11-06 DIAGNOSIS — I499 Cardiac arrhythmia, unspecified: Secondary | ICD-10-CM

## 2012-11-06 DIAGNOSIS — R5381 Other malaise: Secondary | ICD-10-CM

## 2012-11-06 NOTE — Progress Notes (Signed)
Patient ID: ERCELL RAZON, male   DOB: 1950/10/19, 62 y.o.   MRN: [id number]     HPI: JOMEL WHITTLESEY, is a 62 y.o. male who presents to the office today for cardiology follow up evaluation. Chief complaint is that of occasional palpitations.   Mr. Dragone has established coronary artery disease and in 2009 underwent stenting of his mid LAD. In 2012 due to progressive disease he underwent staged intervention with stenting of his proximal right coronary artery and a new site in his proximal LAD. His last nuclear perfusion study in February 2014 continued to show normal perfusion and function. Graft in 2007, Mr. 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 That time, CPAP was not recommended.  Recently, Mr. Shean does note much more fatigue. According to his wife he does snore. He at times they have been witnessed apnea. He does note daytime sleepiness particularly latter part of the day.  Recently, he has noticed palpitations lasting on and off as 3 weeks past, he has seen Dr. Johney Frame for rhythm issues.    Past Medical History  Diagnosis Date  . Arthritis   . Hyperlipidemia   . Hypertension   . COPD (chronic obstructive pulmonary disease)   . PVC's (premature ventricular contractions)   . Paroxysmal atrial fibrillation     s/p afib ablation 10/22/08  . CAD (coronary artery disease)     PCI RCA and LAD 1/12  . DJD (degenerative joint disease)   . Migraines   . Iron deficiency anemia   . Esophagitis 1991    grade 1  . Diverticulosis   . Hemorrhoids   . GERD (gastroesophageal reflux disease)     Hiatal hernia  . Allergic rhinitis     chronic sinusitis  . Cataract     has had lasik surg    Past Surgical History  Procedure Laterality Date  . Lasik    . Nasal sinus surgery  2001  . Coronary angioplasty with stent placement      3 stents/ 4 caths  . Knee  arthroscopy      right  . Neck surgery      Cervical fusion  . Shoulder open rotator cuff repair      right  . Rotator cuff repair      left  . Wrist arthrocentesis      left  . Spine surgery    . Radiofrequency ablation  May 2010    atrial fibrillation  . Eye surgery      Allergies  Allergen Reactions  . Other Shortness Of Breath    BETA BLOCKERS  . Ibuprofen Swelling  . Toprol Xl (Metoprolol Succinate) Other (See Comments)    Personality change    Current Outpatient Prescriptions  Medication Sig Dispense Refill  . aspirin 81 MG tablet Take 81 mg by mouth daily.      Marland Kitchen BYSTOLIC 5 MG tablet Take 1 tablet by mouth daily.       . citalopram (CELEXA) 40 MG tablet       . CRESTOR 10 MG tablet Take 1 tablet by mouth at bedtime.       . fexofenadine (ALLEGRA) 180 MG tablet Take 180 mg by mouth daily.      . fluticasone (FLONASE) 50 MCG/ACT nasal spray 2 sprays by Nasal route at bedtime.       . furosemide (LASIX) 40 MG  tablet Take 1 tablet by mouth daily.       Marland Kitchen HYDROcodone-acetaminophen (VICODIN) 5-500 MG per tablet Take 1 tablet by mouth as needed. Takes 1-2 for migraines      . nitroGLYCERIN (NITROSTAT) 0.3 MG SL tablet Place 0.4 mg under the tongue every 5 (five) minutes as needed. Chest pain       . pantoprazole (PROTONIX) 40 MG tablet Take 1 tablet by mouth daily.       Marland Kitchen PLAVIX 75 MG tablet Take 1 tablet by mouth daily.       . potassium chloride (MICRO-K) 10 MEQ CR capsule Take 1 capsule by mouth daily.       Marland Kitchen telmisartan (MICARDIS) 40 MG tablet Take 1 tablet (40 mg total) by mouth daily.  30 tablet  5  . triazolam (HALCION) 0.25 MG tablet Take 0.25 mg by mouth as needed.        No current facility-administered medications for this visit.   Socially he is married. He has 2 children. He does walk with weather permitting. He does pool maintenance. His remote tobacco history but quit in 1989.   ROS is negative for fever chills night sweats. He does have difficulty with  hearing and has bilateral hearing aids he denies syncope as noted palpitations. He does note fatigue. He denies recent chest pain. He denies bleeding. He denies rash. He denies significant edema. He has been able to tolerate now taking Crestor two out of three day without myalgias. He denies significant weight change. There is no edema. Other system review is negative  PE BP 128/86  Pulse 70  Ht 5\' 8"  (1.727 m)  Wt 245 lb 12.8 oz (111.494 kg)  BMI 37.38 kg/m2  General: Alert, oriented, no distress.  HEENT: Normocephalic, atraumatic. Pupils round and reactive; sclera anicteric;no lid lag,  Nose without nasal septal hypertrophy Mouth/Parynx benign; Mallinpatti scale 3 Neck: Thick neck. No JVD, no carotid briuts Lungs: clear to ausculatation and percussion; no wheezing or rales Heart: RRR, s1 s2 normal 1/6 systolic murmur. Abdomen: soft, nontender; no hepatosplenomehaly, BS+; abdominal aorta nontender and not dilated by palpation. Pulses 2+ Extremities: no clubbing cyanosis or edema, Homan's sign negative  Neurologic: grossly nonfocal  ECG: Sinus rhythm with right bundle branch block at 70 beats per minute with possible left atrial enlargement.  LABS:  BMET    Component Value Date/Time   NA 138 06/09/2010 0428   K 4.1 06/09/2010 0428   CL 107 06/09/2010 0428   CO2 27 06/09/2010 0428   GLUCOSE 93 06/09/2010 0428   BUN 15 06/09/2010 0428   CREATININE 0.96 06/09/2010 0428   CALCIUM 8.6 06/09/2010 0428   GFRNONAA >60 06/09/2010 0428   GFRAA  Value: >60        The eGFR has been calculated using the MDRD equation. This calculation has not been validated in all clinical situations. eGFR's persistently <60 mL/min signify possible Chronic Kidney Disease. 06/09/2010 0428     Hepatic Function Panel     Component Value Date/Time   PROT 6.0 03/04/2008 1116   ALBUMIN 3.9 03/04/2008 1116   AST 26 03/04/2008 1116   ALT 25 03/04/2008 1116   ALKPHOS 65 03/04/2008 1116   BILITOT 0.4 03/04/2008 1116      CBC    Component Value Date/Time   WBC 7.4 06/09/2010 0428   RBC 4.27 06/09/2010 0428   HGB 12.2* 06/09/2010 0428   HCT 37.6* 06/09/2010 0428   PLT 193 06/09/2010 0428  MCV 88.1 06/09/2010 0428   MCH 28.6 06/09/2010 0428   MCHC 32.4 06/09/2010 0428   RDW 12.7 06/09/2010 0428   LYMPHSABS 1.5 06/04/2010 1859   MONOABS 0.7 06/04/2010 1859   EOSABS 0.0 06/04/2010 1859   BASOSABS 0.0 06/04/2010 1859     BNP    Component Value Date/Time   PROBNP 36.0 06/04/2010 2206    Lipid Panel     Component Value Date/Time   CHOL  Value: 132        ATP III CLASSIFICATION:  <200     mg/dL   Desirable  295-621  mg/dL   Borderline High  >=308    mg/dL   High        11/02/7844 0520   TRIG 39 06/05/2010 0520   HDL 41 06/05/2010 0520   CHOLHDL 3.2 06/05/2010 0520   VLDL 8 06/05/2010 0520   LDLCALC  Value: 83        Total Cholesterol/HDL:CHD Risk Coronary Heart Disease Risk Table                     Men   Women  1/2 Average Risk   3.4   3.3  Average Risk       5.0   4.4  2 X Average Risk   9.6   7.1  3 X Average Risk  23.4   11.0        Use the calculated Patient Ratio above and the CHD Risk Table to determine the patient's CHD Risk.        ATP III CLASSIFICATION (LDL):  <100     mg/dL   Optimal  962-952  mg/dL   Near or Above                    Optimal  130-159  mg/dL   Borderline  841-324  mg/dL   High  >401     mg/dL   Very High 0/07/7251 6644     RADIOLOGY: No results found.    ASSESSMENT AND PLAN: Clinically, Mr. Queenan is without recurrent angina symptoms. He is status post intervention to his proximal and mid LAD and right coronary artery. He is seemingly able to tolerate Crestor two out of three days and I will now recommend he increase this to daily. Target LDL is less than 100 and he's not yet at this call. In light of his palpitations I am increasing his Bystolic 7.5 mg. I am concerned that he is possibly having progressive sleep apnea which may be contributing to his daytime sleepiness, fatigue, frequent  awakenings and snoring. I will schedule him for a 7 year followup diagnostic polysomnogram and if abnormal CPAP titration will be recommended. Approximate 6 weeks we'll repeat a comprehensive medical panel in addition to a NMR lipoprotein of the more aggressive lipid management. I will see him in 2-3 months for a followup evaluation.     Lennette Bihari, MD, [facility]  11/06/2012 2:56 PM

## 2012-11-06 NOTE — Patient Instructions (Addendum)
Your physician recommends that you return for lab work in: 6 weeks. You will need to be fasting for this lab work, meaning nothing to eat or drink after midnight the prior to day of lab work.  CMET, LIPID  Your physician has recommended that you have a sleep study. This test records several body functions during sleep, including: brain activity, eye movement, oxygen and carbon dioxide blood levels, heart rate and rhythm, breathing rate and rhythm, the flow of air through your mouth and nose, snoring, body muscle movements, and chest and belly movement.  Your physician recommends that you schedule a follow-up appointment in: 2-3 months.

## 2012-11-13 ENCOUNTER — Other Ambulatory Visit: Payer: Self-pay | Admitting: Physician Assistant

## 2012-11-13 ENCOUNTER — Other Ambulatory Visit: Payer: Self-pay | Admitting: Cardiovascular Disease

## 2012-11-13 DIAGNOSIS — G4730 Sleep apnea, unspecified: Secondary | ICD-10-CM

## 2012-11-13 MED ORDER — HYDROCODONE-ACETAMINOPHEN 5-325 MG PO TABS: 1.0000 | ORAL_TABLET | Freq: Three times a day (TID) | ORAL | Status: DC | PRN

## 2012-11-13 NOTE — Telephone Encounter (Signed)
Please advise on Halcion refills

## 2012-11-13 NOTE — Telephone Encounter (Signed)
Need approval for controlled medication. 

## 2012-11-13 NOTE — Addendum Note (Signed)
Addended by: Donne Anon on: 11/13/2012 03:32 PM   Modules accepted: Orders

## 2012-11-13 NOTE — Telephone Encounter (Signed)
Ok to refill 

## 2012-11-13 NOTE — Telephone Encounter (Signed)
RX called in .

## 2012-11-20 NOTE — Telephone Encounter (Signed)
Lee [name], call patient and see if we can switch to zolpidem 5mg , TK would rather not use triazolam.   Zolpidem 5mg  #30

## 2012-11-21 ENCOUNTER — Telehealth: Payer: Self-pay | Admitting: *Deleted

## 2012-11-21 ENCOUNTER — Telehealth: Payer: Self-pay | Admitting: Cardiovascular Disease

## 2012-11-21 LAB — COMPREHENSIVE METABOLIC PANEL
AST: 20 U/L (ref 0–37)
Albumin: 4.5 g/dL (ref 3.5–5.2)
Alkaline Phosphatase: 57 U/L (ref 39–117)
BUN: 13 mg/dL (ref 6–23)
Glucose, Bld: 86 mg/dL (ref 70–99)
Potassium: 4.6 mEq/L (ref 3.5–5.3)
Total Bilirubin: 0.6 mg/dL (ref 0.3–1.2)

## 2012-11-21 NOTE — Telephone Encounter (Signed)
Message forwarded to Dr. Kelly.

## 2012-11-21 NOTE — Telephone Encounter (Signed)
Left message per Dr. Tresa Endo that he received rx request for Halcion, however he prefers to use zolpidem instead. Please call back to inform if there's any reason he cannot use the zolpidem.

## 2012-11-21 NOTE — Telephone Encounter (Signed)
Wanted you to know it is all right to chanhe his sleeping medicine to generic!

## 2012-11-22 DIAGNOSIS — G4730 Sleep apnea, unspecified: Secondary | ICD-10-CM

## 2012-11-23 LAB — NMR LIPOPROFILE WITH LIPIDS
Cholesterol, Total: 155 mg/dL (ref <200)
HDL Particle Number: 34.4 umol/L (ref >=30.5)
HDL-C: 45 mg/dL (ref >=40)
LDL (calc): 94 mg/dL (ref <100)
LP-IR Score: 54 — ABNORMAL HIGH (ref <=45)
Large HDL-P: 1.9 umol/L — ABNORMAL LOW (ref >=4.8)
Triglycerides: 81 mg/dL (ref <150)

## 2012-12-05 NOTE — Telephone Encounter (Signed)
What is sleeping medication?

## 2012-12-06 NOTE — Telephone Encounter (Signed)
This will be taken care of at patient's next appointment. Already discussed with patient.

## 2012-12-06 NOTE — Telephone Encounter (Signed)
S/w patient in reference to his generic halcion request. . He informs me that he had a sleep study done approx. 2 weeks ago and has appointment to discuss this on July 15th. We agreed to not refill the prescription until after his appointment. Patient says that he has enough medication to last until his appointment.

## 2012-12-10 ENCOUNTER — Encounter: Payer: Self-pay | Admitting: *Deleted

## 2012-12-11 ENCOUNTER — Encounter: Payer: Self-pay | Admitting: Cardiovascular Disease

## 2012-12-12 ENCOUNTER — Ambulatory Visit (INDEPENDENT_AMBULATORY_CARE_PROVIDER_SITE_OTHER): Payer: 59 | Admitting: Cardiovascular Disease

## 2012-12-12 ENCOUNTER — Encounter: Payer: Self-pay | Admitting: Cardiovascular Disease

## 2012-12-12 VITALS — BP 130/90 | HR 69 | Ht 68.0 in | Wt 251.0 lb

## 2012-12-12 DIAGNOSIS — I10 Essential (primary) hypertension: Secondary | ICD-10-CM

## 2012-12-12 DIAGNOSIS — R002 Palpitations: Secondary | ICD-10-CM

## 2012-12-12 DIAGNOSIS — I2510 Atherosclerotic heart disease of native coronary artery without angina pectoris: Secondary | ICD-10-CM

## 2012-12-12 DIAGNOSIS — G4733 Obstructive sleep apnea (adult) (pediatric): Secondary | ICD-10-CM

## 2012-12-12 DIAGNOSIS — E785 Hyperlipidemia, unspecified: Secondary | ICD-10-CM

## 2012-12-12 MED ORDER — ROSUVASTATIN CALCIUM 20 MG PO TABS: 20.0000 mg | ORAL_TABLET | Freq: Every day | ORAL | Status: DC

## 2012-12-12 MED ORDER — NEBIVOLOL HCL 10 MG PO TABS: 10.0000 mg | ORAL_TABLET | Freq: Every day | ORAL | Status: DC

## 2012-12-12 NOTE — Patient Instructions (Addendum)
Your physician recommends that you schedule a follow-up appointment in: 2 months in sleep clinic.  Your physician has recommended you make the following change in your medication:INCREASED THE BYSTOLIC TO 10 MG AND THE CRESTOR TO 20 MG DAILY  If you develop muscle pain you can take COQ 10 300mg  daily.

## 2012-12-18 ENCOUNTER — Encounter: Payer: Self-pay | Admitting: Cardiovascular Disease

## 2012-12-18 DIAGNOSIS — G4733 Obstructive sleep apnea (adult) (pediatric): Secondary | ICD-10-CM | POA: Insufficient documentation

## 2012-12-18 NOTE — Progress Notes (Signed)
Patient ID: Lee [name], male   DOB: 1951-02-26, 62 y.o.   MRN: [id number]     HPI: Lee [name], is a 62 y.o. male who presents to the office today for followup cardiology evaluation.  Mr. Lee [name] to his known coronary artery disease in 2009 underwent stenting of his mid LAD. 2012 underwent staged intervention with stenting of his proximal RCA and a new site in his proximal LAD. A nuclear perfusion study Furber 2014 showed normal perfusion and function. In 2007, he did have a sleep study which suggested upper airway resistance syndrome without overall sleep apnea with the exception of mild sleep apnea with REM sleep at that time CPAP was not recommended.  I last saw the patient on 11/06/2012. At that time, he was having symptoms suggestive of progressive sleep apnea. He ultimately underwent a sleep study. I do not have the specifics of this diagnostic polysomnogram but subsequently he did undergo a CPAP titration to apparently his AHI was 12 on his initial study and he dropped his oxygen from 96% to 83%. He ultimately was titrated up to a CPAP pressure of 11 cm. He is just starting CPAP therapy.  His palpitations have improved on his increased dose of Bystolic a 7.5 mg.    Past Medical History  Diagnosis Date  . Arthritis   . Hyperlipidemia   . Hypertension   . COPD (chronic obstructive pulmonary disease)   . PVC's (premature ventricular contractions)   . Paroxysmal atrial fibrillation     s/p afib ablation 10/22/08  . CAD (coronary artery disease)     PCI RCA and LAD 1/12  . DJD (degenerative joint disease)   . Migraines   . Iron deficiency anemia   . Esophagitis 1991    grade 1  . Diverticulosis   . Hemorrhoids   . GERD (gastroesophageal reflux disease)     Hiatal hernia  . Allergic rhinitis     chronic sinusitis  . Cataract     has had lasik surg  . History of stress test 07/2012    normal prefusion and function with out scar or ischemia  . Sleep apnea    Questionalble: RDI during the total sleep time 6h 28 mins was 3.55/hr during REM sleep was at 5.71/hr. Supine AHI was 5.93/hr.    Past Surgical History  Procedure Laterality Date  . Lasik    . Nasal sinus surgery  2001  . Coronary angioplasty with stent placement      3 stents/ 4 caths  . Knee arthroscopy      right  . Neck surgery      Cervical fusion  . Shoulder open rotator cuff repair      right  . Rotator cuff repair      left  . Wrist arthrocentesis      left  . Spine surgery    . Radiofrequency ablation  May 2010    atrial fibrillation  . Eye surgery    . Cardiac catheterization  05/2010    The proximal LAD was then predilated with 2.0 x 12 trek. this was then stented with a 2.5 x 16 promus Element drug-eluting stent at 14 atmosphere and prostdilated with 2.75 x 12  trek at 16 atmospheres(2.8 mm) resulting the reduction of 80% proximal LAD stenosis to 0% residual with excellent flow.  . Cardiac catheterization  06/05/2010    Successful percutaneous coronary intervention to the right coronary artery with percutaneous transluminal coronary angioplasty/stenting and insertion 3.0 x  16 mm Promus DES post dilated to 3.35 mm with stenosis being reduced to 0%  . Cardiac catheterization  02/2008    Post dilatation was performed using a 2.75 x 9 [place] sprinter, 10 atmospheres for 40 seconds and then 9 atmosphere for 35 sec. This resulted in the 80% area of narrowing pre-intervention, now appearing to normal. There was no edvidence of the dissection or thrombus and there was TIMI III flow pre and post.  . Cardiac catheterization  02/2006    Allergies  Allergen Reactions  . Other Shortness Of Breath    BETA BLOCKERS  . Ibuprofen Swelling  . Toprol Xl (Metoprolol Succinate) Other (See Comments)    Personality change    Current Outpatient Prescriptions  Medication Sig Dispense Refill  . aspirin 81 MG tablet Take 81 mg by mouth daily.      . citalopram (CELEXA) 40 MG tablet         . fexofenadine (ALLEGRA) 180 MG tablet Take 180 mg by mouth daily.      . fluticasone (FLONASE) 50 MCG/ACT nasal spray 2 sprays by Nasal route at bedtime.       . furosemide (LASIX) 40 MG tablet Take 1 tablet by mouth daily.       Marland Kitchen HYDROcodone-acetaminophen (NORCO/VICODIN) 5-325 MG per tablet Take 1 tablet by mouth every 8 (eight) hours as needed for pain.  30 tablet  0  . nitroGLYCERIN (NITROSTAT) 0.3 MG SL tablet Place 0.4 mg under the tongue every 5 (five) minutes as needed. Chest pain       . pantoprazole (PROTONIX) 40 MG tablet TAKE ONE TABLET BY MOUTH DAILY  30 tablet  5  . PLAVIX 75 MG tablet Take 1 tablet by mouth daily.       . potassium chloride (MICRO-K) 10 MEQ CR capsule Take 1 capsule by mouth daily.       Marland Kitchen telmisartan (MICARDIS) 40 MG tablet Take 1 tablet (40 mg total) by mouth daily.  30 tablet  5  . triazolam (HALCION) 0.25 MG tablet Take 0.25 mg by mouth as needed.       . nebivolol (BYSTOLIC) 10 MG tablet Take 1 tablet (10 mg total) by mouth daily.  30 tablet  6  . rosuvastatin (CRESTOR) 20 MG tablet Take 1 tablet (20 mg total) by mouth daily.  30 tablet  6   No current facility-administered medications for this visit.    Socially he's married has 2 children he works in Environmental education officer. There is a remote tobacco history but he quit in 1989.  ROS is negative for fevers, chills or night sweats.  He denies recurrent chest pain the he does have difficulty with hearing and has bilateral hearing aids. He denies palpitations. There is no chest pain. He denies abdominal pain. There is no nausea or vomiting. There is no edema. He denies recent myalgias and now seems to be tolerating Crestor Other system review is negative..  PE BP 130/90  Pulse 69  Ht 5\' 8"  (1.727 m)  Wt 251 lb (113.853 kg)  BMI 38.17 kg/m2  General: Alert, oriented, no distress.  Skin: normal turgor, no rashes HEENT: Normocephalic, atraumatic. Pupils round and reactive; sclera anicteric;no lid lag.  Nose  without nasal septal hypertrophy Mouth/Parynx benign; Mallinpatti scale 3 Neck: No JVD, no carotid briuts Lungs: clear to ausculatation and percussion; no wheezing or rales Heart: RRR, s1 s2 normal  Abdomen: soft, nontender; no hepatosplenomehaly, BS+; abdominal aorta nontender and not dilated by palpation.  Pulses 2+ Extremities: no clubbing cyanosis or edema, Homan's sign negative  Neurologic: grossly nonfocal  ECG: NSR at 69, RBBB  LABS:  BMET    Component Value Date/Time   NA 137 11/21/2012 1025   K 4.6 11/21/2012 1025   CL 101 11/21/2012 1025   CO2 28 11/21/2012 1025   GLUCOSE 86 11/21/2012 1025   BUN 13 11/21/2012 1025   CREATININE 0.93 11/21/2012 1025   CREATININE 0.96 06/09/2010 0428   CALCIUM 9.6 11/21/2012 1025   GFRNONAA >60 06/09/2010 0428   GFRAA  Value: >60        The eGFR has been calculated using the MDRD equation. This calculation has not been validated in all clinical situations. eGFR's persistently <60 mL/min signify possible Chronic Kidney Disease. 06/09/2010 0428     Hepatic Function Panel     Component Value Date/Time   PROT 7.1 11/21/2012 1025   ALBUMIN 4.5 11/21/2012 1025   AST 20 11/21/2012 1025   ALT 19 11/21/2012 1025   ALKPHOS 57 11/21/2012 1025   BILITOT 0.6 11/21/2012 1025     CBC    Component Value Date/Time   WBC 7.4 06/09/2010 0428   RBC 4.27 06/09/2010 0428   HGB 12.2* 06/09/2010 0428   HCT 37.6* 06/09/2010 0428   PLT 193 06/09/2010 0428   MCV 88.1 06/09/2010 0428   MCH 28.6 06/09/2010 0428   MCHC 32.4 06/09/2010 0428   RDW 12.7 06/09/2010 0428   LYMPHSABS 1.5 06/04/2010 1859   MONOABS 0.7 06/04/2010 1859   EOSABS 0.0 06/04/2010 1859   BASOSABS 0.0 06/04/2010 1859     BNP    Component Value Date/Time   PROBNP 36.0 06/04/2010 2206    Lipid Panel     Component Value Date/Time   CHOL  Value: 132        ATP III CLASSIFICATION:  <200     mg/dL   Desirable  956-213  mg/dL   Borderline High  >=086    mg/dL   High        10/05/8467 0520   TRIG 81 11/21/2012  1025   TRIG 39 06/05/2010 0520   HDL 41 06/05/2010 0520   CHOLHDL 3.2 06/05/2010 0520   VLDL 8 06/05/2010 0520   LDLCALC 94 11/21/2012 1025   LDLCALC  Value: 83        Total Cholesterol/HDL:CHD Risk Coronary Heart Disease Risk Table                     Men   Women  1/2 Average Risk   3.4   3.3  Average Risk       5.0   4.4  2 X Average Risk   9.6   7.1  3 X Average Risk  23.4   11.0        Use the calculated Patient Ratio above and the CHD Risk Table to determine the patient's CHD Risk.        ATP III CLASSIFICATION (LDL):  <100     mg/dL   Optimal  629-528  mg/dL   Near or Above                    Optimal  130-159  mg/dL   Borderline  413-244  mg/dL   High  >010     mg/dL   Very High 07/07/2534 6440     RADIOLOGY: No results found.    ASSESSMENT AND PLAN: Mr. Stupka has known CAD  and is without recurrent anginal symptoms, status post intervention to his proximal LAD as well as RCA. When I last saw him he was able to tolerate Crestor 10  two out of three days and recently he has now been able to tolerate this 10 mg daily. An NMR profile has still shown elevated LDL-P at 1274 with an LDL of 94. Presently, I am recommending further titration of his Crestor to 20 mg in the addition of coenzyme Q10 which may help with his previous myalgias. I also will further titrate his Bystolic to 10 mg. He is initiating CPAP therapy. I will see him in the sleep clinic in 2-3 months for followup evaluation and further recommendations will be made at that time.     Lennette Bihari, MD, [facility]  12/18/2012 5:08 PM

## 2012-12-19 ENCOUNTER — Encounter: Payer: Self-pay | Admitting: *Deleted

## 2013-01-01 ENCOUNTER — Telehealth: Payer: Self-pay | Admitting: Family Medicine

## 2013-01-01 MED ORDER — FLUTICASONE PROPIONATE 50 MCG/ACT NA SUSP: 2.0000 | Freq: Every day | NASAL | Status: DC

## 2013-01-01 NOTE — Telephone Encounter (Signed)
Rx Refilled  

## 2013-02-16 ENCOUNTER — Telehealth: Payer: Self-pay | Admitting: Family Medicine

## 2013-02-16 MED ORDER — OXYCODONE-ACETAMINOPHEN 10-325 MG PO TABS: 1.0000 | ORAL_TABLET | Freq: Four times a day (QID) | ORAL | Status: DC | PRN

## 2013-02-16 NOTE — Telephone Encounter (Signed)
Pt states that he has had a migraine for several days and vicodin is not touching it. Can we see him or can we give him something else for pain?  Per [facility] ok to give percocet rx.   Pt aware and will pick up rx

## 2013-02-26 ENCOUNTER — Encounter: Payer: Self-pay | Admitting: Cardiovascular Disease

## 2013-02-26 ENCOUNTER — Ambulatory Visit (INDEPENDENT_AMBULATORY_CARE_PROVIDER_SITE_OTHER): Payer: 59 | Admitting: Cardiovascular Disease

## 2013-02-26 VITALS — BP 118/62 | HR 60 | Ht 68.0 in | Wt 252.9 lb

## 2013-02-26 DIAGNOSIS — G4733 Obstructive sleep apnea (adult) (pediatric): Secondary | ICD-10-CM

## 2013-02-26 DIAGNOSIS — I10 Essential (primary) hypertension: Secondary | ICD-10-CM

## 2013-02-26 DIAGNOSIS — I2510 Atherosclerotic heart disease of native coronary artery without angina pectoris: Secondary | ICD-10-CM

## 2013-02-26 DIAGNOSIS — I498 Other specified cardiac arrhythmias: Secondary | ICD-10-CM

## 2013-02-26 DIAGNOSIS — E785 Hyperlipidemia, unspecified: Secondary | ICD-10-CM

## 2013-02-26 MED ORDER — CITALOPRAM HYDROBROMIDE 40 MG PO TABS: 40.0000 mg | ORAL_TABLET | Freq: Every day | ORAL | Status: DC

## 2013-02-26 MED ORDER — PANTOPRAZOLE SODIUM 40 MG PO TBEC: DELAYED_RELEASE_TABLET | ORAL | Status: DC

## 2013-02-26 NOTE — Progress Notes (Signed)
Patient ID: Lee [name], male   DOB: 22-Mar-1951, 62 y.o.   MRN: [id number]   HPI: Lee [name], is a 62 y.o. male who presents for sleep clinic evaluation follow initiation of CPAP therapy.  [name] is a 62 year old am who has known coronary artery disease and is status post stenting remotely to his LAD and right coronary artery. He has a history of hypertension, hyperlipidemia, obesity and obesity and 7 he underwent a diagnostic polysomnogram which showed very mild sleep apnea during REM sleep. His AHI overall was 3.55 per hour and during REM sleep this was 5.7 per hour. He recently established care with me as his cardiologist after Dr. Rana Snare retired concerned that he was having more difficulty with sleep. Constantly I did referred for followup sleep study. This now confirms significantly progressive sleep apnea such that his AHI was 24.2 per hour and the RDI was 40.6. He underwent a CPAP titration trial and was titrated up to 14 cm water pressure with excellent result the  Since initiating CPAP therapy, he feels remarkably improved. He is more refreshed. His sleep is restorative. He denies any daytime sleepiness. Initially had nasal pillows mask but this did not work well. Most recently he is using a [employer] Simplus fullface mask. Typically goes to bed between 10:30 and 11 PM and wakes up between 5 and 6 AM. He denies frequent awakenings and is no longer snoring.  We did obtain a download  From 7/30 14 through 01/25/2013. He now is set at 14 cm water pressure. Uses excellent such that he is used to 29 of 30 days averaging 6 hours and 15 minutes.AHI is 2.2/hr. There is no significant leak.  Epworth Sleepiness Scale: Situation   Chance of Dozing/Sleeping (0 = never , 1 = slight chance , 2 = moderate chance , 3 = high chance )   sitting and reading 1   watching TV 0   sitting inactive in a public place 1   being a passenger in a motor vehicle for an hour or more 0   lying down in  the afternoon 3   sitting and talking to someone 0   sitting quietly after lunch (no alcohol) 0   while stopped for a few minutes in traffic as the driver 0   Total Score  5    Past Medical History  Diagnosis Date  . Arthritis   . Hyperlipidemia   . Hypertension   . COPD (chronic obstructive pulmonary disease)   . PVC's (premature ventricular contractions)   . Paroxysmal atrial fibrillation     s/p afib ablation 10/22/08  . CAD (coronary artery disease)     PCI RCA and LAD 1/12  . DJD (degenerative joint disease)   . Migraines   . Iron deficiency anemia   . Esophagitis 1991    grade 1  . Diverticulosis   . Hemorrhoids   . GERD (gastroesophageal reflux disease)     Hiatal hernia  . Allergic rhinitis     chronic sinusitis  . Cataract     has had lasik surg  . History of stress test 07/2012    normal prefusion and function with out scar or ischemia  . Sleep apnea     Questionalble: RDI during the total sleep time 6h 28 mins was 3.55/hr during REM sleep was at 5.71/hr. Supine AHI was 5.93/hr.    Past Surgical History  Procedure Laterality Date  . Lasik    . Nasal sinus surgery  2001  . Coronary angioplasty with stent placement      3 stents/ 4 caths  . Knee arthroscopy      right  . Neck surgery      Cervical fusion  . Shoulder open rotator cuff repair      right  . Rotator cuff repair      left  . Wrist arthrocentesis      left  . Spine surgery    . Radiofrequency ablation  May 2010    atrial fibrillation  . Eye surgery    . Cardiac catheterization  05/2010    The proximal LAD was then predilated with 2.0 x 12 trek. this was then stented with a 2.5 x 16 promus Element drug-eluting stent at 14 atmosphere and prostdilated with 2.75 x 12 [place] trek at 16 atmospheres(2.8 mm) resulting the reduction of 80% proximal LAD stenosis to 0% residual with excellent flow.  . Cardiac catheterization  06/05/2010    Successful percutaneous coronary intervention to the right  coronary artery with percutaneous transluminal coronary angioplasty/stenting and insertion 3.0 x 16 mm Promus DES post dilated to 3.35 mm with stenosis being reduced to 0%  . Cardiac catheterization  02/2008    Post dilatation was performed using a 2.75 x 9 [place] sprinter, 10 atmospheres for 40 seconds and then 9 atmosphere for 35 sec. This resulted in the 80% area of narrowing pre-intervention, now appearing to normal. There was no edvidence of the dissection or thrombus and there was TIMI III flow pre and post.  . Cardiac catheterization  02/2006    Allergies  Allergen Reactions  . Other Shortness Of Breath    BETA BLOCKERS  . Ibuprofen Swelling  . Toprol Xl [Metoprolol Succinate] Other (See Comments)    Personality change    Current Outpatient Prescriptions  Medication Sig Dispense Refill  . aspirin 81 MG tablet Take 81 mg by mouth daily.      . citalopram (CELEXA) 40 MG tablet Take 1 tablet (40 mg total) by mouth daily.  30 tablet  6  . fluticasone (FLONASE) 50 MCG/ACT nasal spray Place 2 sprays into the nose at bedtime.  16 g  11  . furosemide (LASIX) 40 MG tablet Take 1 tablet by mouth daily.       Marland Kitchen HYDROcodone-acetaminophen (NORCO/VICODIN) 5-325 MG per tablet Take 1 tablet by mouth every 8 (eight) hours as needed for pain.  30 tablet  0  . nebivolol (BYSTOLIC) 10 MG tablet Take 1 tablet (10 mg total) by mouth daily.  30 tablet  6  . nitroGLYCERIN (NITROSTAT) 0.3 MG SL tablet Place 0.4 mg under the tongue every 5 (five) minutes as needed. Chest pain       . oxyCODONE-acetaminophen (PERCOCET) 10-325 MG per tablet Take 1 tablet by mouth every 6 (six) hours as needed for pain.  30 tablet  0  . pantoprazole (PROTONIX) 40 MG tablet TAKE ONE TABLET BY MOUTH DAILY  30 tablet  5  . PLAVIX 75 MG tablet Take 1 tablet by mouth daily.       . potassium chloride (MICRO-K) 10 MEQ CR capsule Take 1 capsule by mouth daily.       . rosuvastatin (CRESTOR) 20 MG tablet Take 1 tablet (20 mg total) by  mouth daily.  30 tablet  6  . telmisartan (MICARDIS) 40 MG tablet Take 1 tablet (40 mg total) by mouth daily.  30 tablet  5  . triazolam (HALCION) 0.25 MG tablet Take 0.25  mg by mouth as needed.       Marland Kitchen UNABLE TO FIND CPAP therapy       No current facility-administered medications for this visit.     Social is notable that he is married has 2 children. He works in Environmental education officer. He is a former smoker and quit in 1989.  ROS negative for fever, chills or night sweats.  He denies visual symptoms. He denies wheezing. He denies any recent palpitations and these have improved significantly since using CPAP therapy. There is no weight change. He denies bleeding. He denies bruxism. There is no restless legs. As long as he uses his CPAP he no longer is snoring. Other system review is negative.  PE BP 118/62  Pulse 60  Ht 5\' 8"  (1.727 m)  Wt 252 lb 14.4 oz (114.715 kg)  BMI 38.46 kg/m2  General: Alert, oriented, no distress.  Skin: normal turgor, no rashes HEENT: Normocephalic, atraumatic. Pupils round and reactive; sclera anicteric; Fundi mild arterial narrowing Nose without nasal septal hypertrophy Mouth/Parynx benign; Mallinpatti scale 3 Neck: No JVD, no carotid briuts Lungs: clear to ausculatation and percussion; no wheezing or rales Heart: RRR, s1 s2 normal 1/6 sem Abdomen: soft, nontender; no hepatosplenomehaly, BS+; abdominal aorta nontender and not dilated by palpation. Pulses 2+ Extremities: no clubbinbg cyanosis or edema, Homan's sign negative  Neurologic: grossly nonfocal  LABS:  BMET    Component Value Date/Time   NA 137 11/21/2012 1025   K 4.6 11/21/2012 1025   CL 101 11/21/2012 1025   CO2 28 11/21/2012 1025   GLUCOSE 86 11/21/2012 1025   BUN 13 11/21/2012 1025   CREATININE 0.93 11/21/2012 1025   CREATININE 0.96 06/09/2010 0428   CALCIUM 9.6 11/21/2012 1025   GFRNONAA >60 06/09/2010 0428   GFRAA  Value: >60        The eGFR has been calculated using the MDRD equation. This  calculation has not been validated in all clinical situations. eGFR's persistently <60 mL/min signify possible Chronic Kidney Disease. 06/09/2010 0428     Hepatic Function Panel     Component Value Date/Time   PROT 7.1 11/21/2012 1025   ALBUMIN 4.5 11/21/2012 1025   AST 20 11/21/2012 1025   ALT 19 11/21/2012 1025   ALKPHOS 57 11/21/2012 1025   BILITOT 0.6 11/21/2012 1025     CBC    Component Value Date/Time   WBC 7.4 06/09/2010 0428   RBC 4.27 06/09/2010 0428   HGB 12.2* 06/09/2010 0428   HCT 37.6* 06/09/2010 0428   PLT 193 06/09/2010 0428   MCV 88.1 06/09/2010 0428   MCH 28.6 06/09/2010 0428   MCHC 32.4 06/09/2010 0428   RDW 12.7 06/09/2010 0428   LYMPHSABS 1.5 06/04/2010 1859   MONOABS 0.7 06/04/2010 1859   EOSABS 0.0 06/04/2010 1859   BASOSABS 0.0 06/04/2010 1859     BNP    Component Value Date/Time   PROBNP 36.0 06/04/2010 2206    Lipid Panel     Component Value Date/Time   CHOL  Value: 132        ATP III CLASSIFICATION:  <200     mg/dL   Desirable  409-811  mg/dL   Borderline High  >=914    mg/dL   High        12/06/2954 0520   TRIG 81 11/21/2012 1025   TRIG 39 06/05/2010 0520   HDL 41 06/05/2010 0520   CHOLHDL 3.2 06/05/2010 0520   VLDL 8 06/05/2010 0520  LDLCALC 94 11/21/2012 1025   LDLCALC  Value: 83        Total Cholesterol/HDL:CHD Risk Coronary Heart Disease Risk Table                     Men   Women  1/2 Average Risk   3.4   3.3  Average Risk       5.0   4.4  2 X Average Risk   9.6   7.1  3 X Average Risk  23.4   11.0        Use the calculated Patient Ratio above and the CHD Risk Table to determine the patient's CHD Risk.        ATP III CLASSIFICATION (LDL):  <100     mg/dL   Optimal  161-096  mg/dL   Near or Above                    Optimal  130-159  mg/dL   Borderline  045-409  mg/dL   High  >811     mg/dL   Very High 01/29/4781 9562     RADIOLOGY: No results found.    ASSESSMENT AND PLAN: Ms. Tayvion Lauder now has been utilizing CPAP or several months. His most recent down  though demonstrates excellent compliance and benefit. Subjectively he notes resolution of previous daytime sleepiness, is much more refreshed, has more energy, and no longer is snoring. He also notices that his palpitations have essentially resolved. His blood pressure is controlled. He is not having anginal symptoms. We did discuss the importance of weight loss. He has gained some weight since 2007 which may be contributing to his worsening of weight disorder breathing. I will see him in 6 months for followup urologic evaluation or sooner if problems arise.     Lennette Bihari, MD, [facility]  02/26/2013 2:29 PM

## 2013-02-26 NOTE — Patient Instructions (Addendum)
Your physician recommends that you schedule a follow-up appointment in 6 months for cardiology therapy.

## 2013-04-03 ENCOUNTER — Other Ambulatory Visit: Payer: Self-pay | Admitting: Cardiovascular Disease

## 2013-04-11 ENCOUNTER — Ambulatory Visit: Payer: 59 | Admitting: Cardiovascular Disease

## 2013-04-12 ENCOUNTER — Other Ambulatory Visit: Payer: Self-pay | Admitting: *Deleted

## 2013-04-12 ENCOUNTER — Ambulatory Visit: Payer: 59 | Admitting: Cardiovascular Disease

## 2013-04-12 MED ORDER — NITROGLYCERIN 0.4 MG SL SUBL: 0.4000 mg | SUBLINGUAL_TABLET | SUBLINGUAL | Status: DC | PRN

## 2013-04-12 MED ORDER — TRIAZOLAM 0.25 MG PO TABS: 0.2500 mg | ORAL_TABLET | ORAL | Status: DC | PRN

## 2013-04-13 ENCOUNTER — Other Ambulatory Visit: Payer: Self-pay | Admitting: *Deleted

## 2013-04-13 DIAGNOSIS — E782 Mixed hyperlipidemia: Secondary | ICD-10-CM

## 2013-04-13 DIAGNOSIS — I2510 Atherosclerotic heart disease of native coronary artery without angina pectoris: Secondary | ICD-10-CM

## 2013-04-27 ENCOUNTER — Other Ambulatory Visit: Payer: Self-pay | Admitting: Cardiovascular Disease

## 2013-04-30 NOTE — Telephone Encounter (Signed)
Rx was sent to pharmacy electronically. 

## 2013-05-03 ENCOUNTER — Other Ambulatory Visit: Payer: 59

## 2013-05-03 DIAGNOSIS — E785 Hyperlipidemia, unspecified: Secondary | ICD-10-CM

## 2013-05-03 DIAGNOSIS — Z79899 Other long term (current) drug therapy: Secondary | ICD-10-CM

## 2013-05-03 DIAGNOSIS — I10 Essential (primary) hypertension: Secondary | ICD-10-CM

## 2013-05-03 DIAGNOSIS — Z0000 Encounter for general adult medical examination without abnormal findings: Secondary | ICD-10-CM

## 2013-05-03 LAB — CBC WITH DIFFERENTIAL/PLATELET
Basophils Absolute: 0 10*3/uL (ref 0.0–0.1)
Eosinophils Absolute: 0.1 10*3/uL (ref 0.0–0.7)
Lymphocytes Relative: 27 % (ref 12–46)
Lymphs Abs: 1.7 10*3/uL (ref 0.7–4.0)
Monocytes Relative: 8 % (ref 3–12)
Neutro Abs: 4.1 10*3/uL (ref 1.7–7.7)
Neutrophils Relative %: 62 % (ref 43–77)
Platelets: 224 10*3/uL (ref 150–400)
RBC: 4.81 MIL/uL (ref 4.22–5.81)
WBC: 6.4 10*3/uL (ref 4.0–10.5)

## 2013-05-03 LAB — LIPID PANEL
Cholesterol: 143 mg/dL (ref 0–200)
Total CHOL/HDL Ratio: 3.2 Ratio
VLDL: 13 mg/dL (ref 0–40)

## 2013-05-03 LAB — COMPREHENSIVE METABOLIC PANEL
ALT: 25 U/L (ref 0–53)
AST: 22 U/L (ref 0–37)
Albumin: 4.1 g/dL (ref 3.5–5.2)
CO2: 26 mEq/L (ref 19–32)
Calcium: 9.2 mg/dL (ref 8.4–10.5)
Chloride: 99 mEq/L (ref 96–112)
Glucose, Bld: 93 mg/dL (ref 70–99)
Potassium: 4.7 mEq/L (ref 3.5–5.3)
Sodium: 134 mEq/L — ABNORMAL LOW (ref 135–145)
Total Bilirubin: 0.5 mg/dL (ref 0.3–1.2)
Total Protein: 6.7 g/dL (ref 6.0–8.3)

## 2013-05-03 LAB — PSA: PSA: 1.13 ng/mL (ref <=4.00)

## 2013-05-04 ENCOUNTER — Encounter: Payer: Self-pay | Admitting: Family Medicine

## 2013-05-04 ENCOUNTER — Ambulatory Visit (INDEPENDENT_AMBULATORY_CARE_PROVIDER_SITE_OTHER): Payer: 59 | Admitting: Family Medicine

## 2013-05-04 VITALS — BP 130/78 | HR 68 | Temp 97.4°F | Resp 18 | Ht 68.0 in | Wt 262.0 lb

## 2013-05-04 DIAGNOSIS — Z0000 Encounter for general adult medical examination without abnormal findings: Secondary | ICD-10-CM

## 2013-05-04 NOTE — Progress Notes (Signed)
Subjective:    Patient ID: Lee [name], male    DOB: [birth year], 62 y.o.   MRN: [id number]  HPI  Patient is a very pleasant 62 year old white male comes in today for complete physical exam. He has no major medical concerns. His last tetanus shot was in 2013. He had a Zostavax in 2013. He had Pneumovax 23 in 2006. He received his flu shot in October. He is not due for Prevnar 13 until 42. His last colonoscopy was 2 years ago and was normal. His most recent labwork as listed below: No visits with results within 1 Day(s) from this visit. Latest known visit with results is:  Lab on 05/03/2013  Component Date Value Range Status  . WBC 05/03/2013 6.4  4.0 - 10.5 K/uL Final  . RBC 05/03/2013 4.81  4.22 - 5.81 MIL/uL Final  . Hemoglobin 05/03/2013 14.2  13.0 - 17.0 g/dL Final  . HCT 40/98/1191 40.4  39.0 - 52.0 % Final  . MCV 05/03/2013 84.0  78.0 - 100.0 fL Final  . MCH 05/03/2013 29.5  26.0 - 34.0 pg Final  . MCHC 05/03/2013 35.1  30.0 - 36.0 g/dL Final  . RDW 47/82/9562 13.6  11.5 - 15.5 % Final  . Platelets 05/03/2013 224  150 - 400 K/uL Final  . Neutrophils Relative % 05/03/2013 62  43 - 77 % Final  . Neutro Abs 05/03/2013 4.1  1.7 - 7.7 K/uL Final  . Lymphocytes Relative 05/03/2013 27  12 - 46 % Final  . Lymphs Abs 05/03/2013 1.7  0.7 - 4.0 K/uL Final  . Monocytes Relative 05/03/2013 8  3 - 12 % Final  . Monocytes Absolute 05/03/2013 0.5  0.1 - 1.0 K/uL Final  . Eosinophils Relative 05/03/2013 2  0 - 5 % Final  . Eosinophils Absolute 05/03/2013 0.1  0.0 - 0.7 K/uL Final  . Basophils Relative 05/03/2013 1  0 - 1 % Final  . Basophils Absolute 05/03/2013 0.0  0.0 - 0.1 K/uL Final  . Smear Review 05/03/2013 Criteria for review not met   Final  . Sodium 05/03/2013 134* 135 - 145 mEq/L Final  . Potassium 05/03/2013 4.7  3.5 - 5.3 mEq/L Final  . Chloride 05/03/2013 99  96 - 112 mEq/L Final  . CO2 05/03/2013 26  19 - 32 mEq/L Final  . Glucose, Bld 05/03/2013 93  70 - 99 mg/dL Final    . BUN 13/12/6576 10  6 - 23 mg/dL Final  . Creat 46/96/2952 0.87  0.50 - 1.35 mg/dL Final  . Total Bilirubin 05/03/2013 0.5  0.3 - 1.2 mg/dL Final  . Alkaline Phosphatase 05/03/2013 56  39 - 117 U/L Final  . AST 05/03/2013 22  0 - 37 U/L Final  . ALT 05/03/2013 25  0 - 53 U/L Final  . Total Protein 05/03/2013 6.7  6.0 - 8.3 g/dL Final  . Albumin 84/13/2440 4.1  3.5 - 5.2 g/dL Final  . Calcium 03/27/2535 9.2  8.4 - 10.5 mg/dL Final  . Cholesterol 64/40/3474 143  0 - 200 mg/dL Final   Comment: ATP III Classification:                                < 200        mg/dL        Desirable  200 - 239     mg/dL        Borderline High                               >= 240        mg/dL        High                             . Triglycerides 05/03/2013 63  <150 mg/dL Final  . HDL 62/13/0865 45  >39 mg/dL Final  . Total CHOL/HDL Ratio 05/03/2013 3.2   Final  . VLDL 05/03/2013 13  0 - 40 mg/dL Final  . LDL Cholesterol 05/03/2013 85  0 - 99 mg/dL Final   Comment:                            Total Cholesterol/HDL Ratio:CHD Risk                                                 Coronary Heart Disease Risk Table                                                                 Men       Women                                   1/2 Average Risk              3.4        3.3                                       Average Risk              5.0        4.4                                    2X Average Risk              9.6        7.1                                    3X Average Risk             23.4       11.0                          Use the calculated Patient Ratio above and the CHD Risk table  to determine the patient's CHD Risk.                          ATP III Classification (LDL):                                < 100        mg/dL         Optimal                               100 - 129     mg/dL         Near or Above Optimal                                130 - 159     mg/dL         Borderline High                               160 - 189     mg/dL         High                                > 190        mg/dL         Very High                             . PSA 05/03/2013 1.13  <=4.00 ng/mL Final   Comment: Test Methodology: ECLIA PSA (Electrochemiluminescence Immunoassay)                                                     For PSA values from 2.5-4.0, particularly in younger men <60 years                          old, the AUA and NCCN suggest testing for % Free PSA (3515) and                          evaluation of the rate of increase in PSA (PSA velocity).   Past Medical History  Diagnosis Date  . Arthritis   . Hyperlipidemia   . Hypertension   . COPD (chronic obstructive pulmonary disease)   . PVC's (premature ventricular contractions)   . Paroxysmal atrial fibrillation     s/p afib ablation 10/22/08  . CAD (coronary artery disease)     PCI RCA and LAD 1/12  . DJD (degenerative joint disease)   . Migraines   . Iron deficiency anemia   . Esophagitis 1991    grade 1  . Diverticulosis   . Hemorrhoids   . GERD (gastroesophageal reflux disease)     Hiatal hernia  . Allergic rhinitis     chronic sinusitis  . Cataract     has had lasik surg  . History of stress test 07/2012  normal prefusion and function with out scar or ischemia  . Sleep apnea     Questionalble: RDI during the total sleep time 6h 28 mins was 3.55/hr during REM sleep was at 5.71/hr. Supine AHI was 5.93/hr.   Current Outpatient Prescriptions on File Prior to Visit  Medication Sig Dispense Refill  . aspirin 81 MG tablet Take 81 mg by mouth daily.      . citalopram (CELEXA) 40 MG tablet Take 1 tablet (40 mg total) by mouth daily.  30 tablet  6  . clopidogrel (PLAVIX) 75 MG tablet TAKE ONE TABLET BY MOUTH DAILY  30 tablet  2  . fluticasone (FLONASE) 50 MCG/ACT nasal spray Place 2 sprays into the nose at bedtime.  16 g  11  . furosemide (LASIX) 40 MG tablet TAKE 1  TABLET BY MOUTH EVERY DAY  30 tablet  2  . HYDROcodone-acetaminophen (NORCO/VICODIN) 5-325 MG per tablet Take 1 tablet by mouth every 8 (eight) hours as needed for pain.  30 tablet  0  . nebivolol (BYSTOLIC) 10 MG tablet Take 1 tablet (10 mg total) by mouth daily.  30 tablet  6  . nitroGLYCERIN (NITROSTAT) 0.4 MG SL tablet Place 1 tablet (0.4 mg total) under the tongue every 5 (five) minutes as needed. Chest pain  25 tablet  3  . oxyCODONE-acetaminophen (PERCOCET) 10-325 MG per tablet Take 1 tablet by mouth every 6 (six) hours as needed for pain.  30 tablet  0  . pantoprazole (PROTONIX) 40 MG tablet TAKE ONE TABLET BY MOUTH DAILY  30 tablet  5  . potassium chloride (MICRO-K) 10 MEQ CR capsule Take 1 capsule by mouth daily.       . rosuvastatin (CRESTOR) 20 MG tablet Take 1 tablet (20 mg total) by mouth daily.  30 tablet  6  . telmisartan (MICARDIS) 40 MG tablet TAKE 1 TABLET BY MOUTH DAILY  30 tablet  5  . UNABLE TO FIND CPAP therapy       No current facility-administered medications on file prior to visit.   Allergies  Allergen Reactions  . Other Shortness Of Breath    BETA BLOCKERS  . Ibuprofen Swelling  . Toprol Xl [Metoprolol Succinate] Other (See Comments)    Personality change   Past Surgical History  Procedure Laterality Date  . Lasik    . Nasal sinus surgery  2001  . Coronary angioplasty with stent placement      3 stents/ 4 caths  . Knee arthroscopy      right  . Neck surgery      Cervical fusion  . Shoulder open rotator cuff repair      right  . Rotator cuff repair      left  . Wrist arthrocentesis      left  . Spine surgery    . Radiofrequency ablation  May 2010    atrial fibrillation  . Eye surgery    . Cardiac catheterization  05/2010    The proximal LAD was then predilated with 2.0 x 12 trek. this was then stented with a 2.5 x 16 promus Element drug-eluting stent at 14 atmosphere and prostdilated with 2.75 x 12 [place] trek at 16 atmospheres(2.8 mm) resulting the  reduction of 80% proximal LAD stenosis to 0% residual with excellent flow.  . Cardiac catheterization  06/05/2010    Successful percutaneous coronary intervention to the right coronary artery with percutaneous transluminal coronary angioplasty/stenting and insertion 3.0 x 16 mm Promus DES post dilated to  3.35 mm with stenosis being reduced to 0%  . Cardiac catheterization  02/2008    Post dilatation was performed using a 2.75 x 9 [place] sprinter, 10 atmospheres for 40 seconds and then 9 atmosphere for 35 sec. This resulted in the 80% area of narrowing pre-intervention, now appearing to normal. There was no edvidence of the dissection or thrombus and there was TIMI III flow pre and post.  . Cardiac catheterization  02/2006   History   Social History  . Marital Status: Married    Spouse Name: N/A    Number of Children: 2  . Years of Education: N/A   Occupational History  . retired    Social History Main Topics  . Smoking status: Former Smoker    Quit date: 06/01/1987  . Smokeless tobacco: Never Used  . Alcohol Use: Yes     Comment: once every 6-7 months  . Drug Use: No  . Sexual Activity: Not on file   Other Topics Concern  . Not on file   Social History Narrative  . No narrative on file   Family History  Problem Relation Age of Onset  . Heart disease Father     and sister  . Diabetes Father   . Colon cancer Mother     mets from uterine  . Uterine cancer Mother   . Heart disease Sister      Review of Systems  All other systems reviewed and are negative.       Objective:   Physical Exam  Vitals reviewed. Constitutional: He is oriented to person, place, and time. He appears well-developed and well-nourished.  HENT:  Head: Normocephalic and atraumatic.  Right Ear: External ear normal.  Left Ear: External ear normal.  Nose: Nose normal.  Mouth/Throat: Oropharynx is clear and moist. No oropharyngeal exudate.  Eyes: Conjunctivae and EOM are normal. Pupils are equal,  round, and reactive to light. Right eye exhibits no discharge. Left eye exhibits no discharge. No scleral icterus.  Neck: Normal range of motion. Neck supple. No JVD present. No tracheal deviation present.  Cardiovascular: Normal rate, regular rhythm, normal heart sounds and intact distal pulses.  Exam reveals no gallop and no friction rub.   No murmur heard. Pulmonary/Chest: Effort normal and breath sounds normal. No stridor. No respiratory distress. He has no wheezes. He has no rales. He exhibits no tenderness.  Abdominal: Soft. Bowel sounds are normal. He exhibits no distension and no mass. There is no tenderness. There is no rebound and no guarding.  Genitourinary: Rectum normal, prostate normal and penis normal.  Musculoskeletal: Normal range of motion. He exhibits no edema and no tenderness.  Lymphadenopathy:    He has no cervical adenopathy.  Neurological: He is alert and oriented to person, place, and time. He has normal reflexes. He displays normal reflexes. No cranial nerve deficit. He exhibits normal muscle tone. Coordination normal.  Skin: Skin is warm. No rash noted. He is not diaphoretic. No erythema. No pallor.  Psychiatric: He has a normal mood and affect. His behavior is normal. Judgment and thought content normal.          Assessment & Plan:  1. Routine general medical examination at a health care facility Patient's lab work is outstanding. However his LDL is slightly above his goal of 70. Upon further questioning, the patient admits to eating hamburgers hot dogs and steak and cheese subs everyday for lunch and dinner. I recommended discontinuing this to try help address his elevated LDL.  Also recommended beginning exercise 30 minutes a day 5 days a week. I recommended weight loss. His goal weight would be approximately 180 pounds. The remainder of his cancer screening is up to date. His immunizations are up-to-date. Recheck in one year or as needed.

## 2013-05-28 ENCOUNTER — Other Ambulatory Visit: Payer: Self-pay | Admitting: Specialist

## 2013-06-03 ENCOUNTER — Other Ambulatory Visit: Payer: 59

## 2013-06-06 ENCOUNTER — Ambulatory Visit
Admission: RE | Admit: 2013-06-06 | Discharge: 2013-06-06 | Disposition: A | Discharge status: Home / Self Care | Payer: 59 | Source: Ambulatory Visit | Attending: Specialist | Admitting: Specialist

## 2013-06-06 DIAGNOSIS — R51 Headache: Principal | ICD-10-CM

## 2013-06-06 DIAGNOSIS — R519 Headache, unspecified: Secondary | ICD-10-CM

## 2013-06-20 ENCOUNTER — Encounter: Payer: 59 | Attending: Cardiovascular Disease | Admitting: *Deleted

## 2013-06-20 ENCOUNTER — Encounter: Payer: Self-pay | Admitting: *Deleted

## 2013-06-20 VITALS — Ht 68.0 in | Wt 264.9 lb

## 2013-06-20 DIAGNOSIS — E785 Hyperlipidemia, unspecified: Secondary | ICD-10-CM

## 2013-06-20 DIAGNOSIS — E669 Obesity, unspecified: Secondary | ICD-10-CM | POA: Insufficient documentation

## 2013-06-20 DIAGNOSIS — E782 Mixed hyperlipidemia: Secondary | ICD-10-CM | POA: Insufficient documentation

## 2013-06-20 DIAGNOSIS — Z713 Dietary counseling and surveillance: Secondary | ICD-10-CM | POA: Insufficient documentation

## 2013-06-20 NOTE — Patient Instructions (Signed)
Plan:  Aim for 3 Carb Choices per meal (45 grams) +/- 1 either way  Aim for 0-2 Carbs per snack if hungry Consider including protein with meals and snacks Consider reading food labels for Total Carbohydrate of foods Consider  increasing your activity level by swimming at [facility] daily as tolerated

## 2013-06-20 NOTE — Progress Notes (Signed)
  Medical Nutrition Therapy:  Appt start time: 1500 end time:  1600.  Assessment:  Primary concerns today: patient here for obesity and mixed hyperlipidemia. He is here with his wife who appears supportive. History of obesity as an adult, lost about 40 pounds with an RD here at [facility] about 15 years ago, and he states when he retired he gained it all back. His highest weight to his memory was 263#. 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 He tries to walk if weather is good. He states he has been having more headaches lately for the past 3 months, he is seeing a neurology specialist for this.  Preferred Learning Style:   No preference indicated   Learning Readiness:   Ready  Change in progress  MEDICATIONS: see list   DIETARY INTAKE:  24-hr recall:  B ( AM): drinks Adkins shake in AM, decaf coffee Snk ( AM): another Adkins shake  L ( PM): used to be hamburgers and hot dogs, now 6" subway sandwich with lean meat, water Snk ( PM): not lately D ( PM): lean meat, several vegetables or salad without dressing, starch, and bread, water Snk ( PM): occasionally peanuts or bagel chips Beverages: decaf coffee, water  Usual physical activity: not lately, used to walk outside in the past,   Estimated energy needs: 1500 calories 170 g carbohydrates 112 g protein 42 g fat  Intervention:  Nutrition counseling and diabetes education initiated. Discussed Carb Counting as method of portion control, reading food labels, and benefits of increased activity. Also discussed hyperlipidemia and difference between saturated and unsaturated fats. Commended him on the steps he has already taken to improve his eating habits. Discussed options for him to increase his activity level when weather not conducive to walking.  Plan:  Aim for 3 Carb Choices per meal  (45 grams) +/- 1 either way  Aim for 0-2 Carbs per snack if hungry Consider including protein with meals and snacks Consider reading food labels for Total Carbohydrate of foods Consider  increasing your activity level by swimming at [facility] daily as tolerated   Teaching Method Utilized: Visual, Auditory and Hands on  Handouts given during visit include: Living Well with Diabetes Carb Counting and Food Label handouts Meal Plan Card  Carb Counting Booklet including list of fat breakdown for meats and fat servings  Barriers to learning/adherence to lifestyle change: history of multiple diets in the past  Demonstrated degree of understanding via:  Teach Back   Monitoring/Evaluation:  Dietary intake, exercise, reading food lables, and body weight in 1 month(s).Marland Kitchen

## 2013-06-22 ENCOUNTER — Other Ambulatory Visit: Payer: Self-pay | Admitting: Cardiovascular Disease

## 2013-06-22 NOTE — Telephone Encounter (Signed)
Rx was sent to pharmacy electronically. 

## 2013-06-26 ENCOUNTER — Encounter: Payer: Self-pay | Admitting: Family Medicine

## 2013-06-26 ENCOUNTER — Ambulatory Visit (INDEPENDENT_AMBULATORY_CARE_PROVIDER_SITE_OTHER): Payer: 59 | Admitting: Family Medicine

## 2013-06-26 VITALS — BP 146/82 | HR 74 | Temp 97.3°F | Resp 18 | Wt 267.0 lb

## 2013-06-26 DIAGNOSIS — J019 Acute sinusitis, unspecified: Secondary | ICD-10-CM

## 2013-06-26 MED ORDER — AMOXICILLIN-POT CLAVULANATE 875-125 MG PO TABS: 1.0000 | ORAL_TABLET | Freq: Two times a day (BID) | ORAL | Status: DC

## 2013-06-26 NOTE — Progress Notes (Signed)
Subjective:    Patient ID: Lee [name], male    DOB: 1950-12-01, 63 y.o.   MRN: [id number]  HPI Patient is a very pleasant 63 year old white gentleman who has been giving her sinus troubles for almost 3 weeks. His symptoms began with a flulike illness approximately one month ago. The chest congestion has totally resolved. However, over the last 2 weeks he's developed pressure in his frontal and maxillary sinuses. He developed epistaxis. He is developed purulent nasal discharge. He developed pain in his maxillary sinuses. He has developed subjective fevers. He has a history of chronic sinusitis. 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 Past Medical History  Diagnosis Date  . Arthritis   . Hyperlipidemia   . Hypertension   . COPD (chronic obstructive pulmonary disease)   . PVC's (premature ventricular contractions)   . Paroxysmal atrial fibrillation     s/p afib ablation 10/22/08  . CAD (coronary artery disease)     PCI RCA and LAD 1/12  . DJD (degenerative joint disease)   . Migraines   . Iron deficiency anemia   . Esophagitis 1991    grade 1  . Diverticulosis   . Hemorrhoids   . GERD (gastroesophageal reflux disease)     Hiatal hernia  . Allergic rhinitis     chronic sinusitis  . Cataract     has had lasik surg  . History of stress test 07/2012    normal prefusion and function with out scar or ischemia  . Sleep apnea     Questionalble: RDI during the total sleep time 6h 28 mins was 3.55/hr during REM sleep was at 5.71/hr. Supine AHI was 5.93/hr.   Current Outpatient Prescriptions on File Prior to Visit  Medication Sig Dispense Refill  . aspirin 81 MG tablet Take 81 mg by mouth daily.      Marland Kitchen BYSTOLIC 10 MG tablet TAKE 1 TABLET BY MOUTH DAILY  30 tablet  6  . cholecalciferol (VITAMIN D) 1000 UNITS tablet Take 1,000 Units by mouth daily.      . citalopram (CELEXA) 40 MG tablet Take 1 tablet (40 mg total) by mouth daily.  30 tablet  6  . clopidogrel (PLAVIX) 75  MG tablet TAKE 1 TABLET BY MOUTH DAILY  30 tablet  6  . co-enzyme Q-10 30 MG capsule Take 30 mg by mouth 3 (three) times daily.      . CRESTOR 20 MG tablet TAKE 1 TABLET BY MOUTH DAILY  30 tablet  6  . fluticasone (FLONASE) 50 MCG/ACT nasal spray Place 2 sprays into the nose at bedtime.  16 g  11  . furosemide (LASIX) 40 MG tablet TAKE 1 TABLET BY MOUTH EVERY DAY  30 tablet  2  . nitroGLYCERIN (NITROSTAT) 0.4 MG SL tablet Place 1 tablet (0.4 mg total) under the tongue every 5 (five) minutes as needed. Chest pain  25 tablet  3  . oxyCODONE-acetaminophen (PERCOCET) 10-325 MG per tablet Take 1 tablet by mouth every 6 (six) hours as needed for pain.  30 tablet  0  . pantoprazole (PROTONIX) 40 MG tablet TAKE ONE TABLET BY MOUTH DAILY  30 tablet  5  . potassium chloride (MICRO-K) 10 MEQ CR capsule Take 1 capsule by mouth daily.       Marland Kitchen telmisartan (MICARDIS) 40 MG tablet TAKE 1 TABLET BY MOUTH DAILY  30 tablet  5  . triazolam (HALCION) 0.25 MG tablet Take 0.25 mg by mouth at bedtime as needed  for sleep.      Marland Kitchen UNABLE TO FIND CPAP therapy      . baclofen (LIORESAL) 10 MG tablet Take 10 mg by mouth as needed for muscle spasms.       No current facility-administered medications on file prior to visit.   Allergies  Allergen Reactions  . Other Shortness Of Breath    BETA BLOCKERS  . Ibuprofen Swelling  . Toprol Xl [Metoprolol Succinate] Other (See Comments)    Personality change   History   Social History  . Marital Status: Married    Spouse Name: N/A    Number of Children: 2  . Years of Education: N/A   Occupational History  . retired    Social History Main Topics  . Smoking status: Former Smoker    Quit date: 06/01/1987  . Smokeless tobacco: Never Used  . Alcohol Use: Yes     Comment: once every 6-7 months  . Drug Use: No  . Sexual Activity: Not on file   Other Topics Concern  . Not on file   Social History Narrative  . No narrative on file      Review of Systems  All  other systems reviewed and are negative.       Objective:   Physical Exam  Vitals reviewed. HENT:  Right Ear: Tympanic membrane, external ear and ear canal normal.  Left Ear: Tympanic membrane, external ear and ear canal normal.  Nose: Mucosal edema and rhinorrhea present. Right sinus exhibits maxillary sinus tenderness. Left sinus exhibits maxillary sinus tenderness.  Mouth/Throat: Oropharynx is clear and moist. No oropharyngeal exudate.  Neck: Neck supple.  Cardiovascular: Normal rate, regular rhythm and normal heart sounds.   Pulmonary/Chest: Effort normal and breath sounds normal. No respiratory distress. He has no wheezes. He has no rales.  Lymphadenopathy:    He has no cervical adenopathy.          Assessment & Plan:  1. Acute rhinosinusitis Begin Augmentin 875 mg by mouth twice a day for 10 days. Continue Allegra and Flonase. Advised nasal saline 4 times a day along with Mucinex 400 mg every 6 hours. - amoxicillin-clavulanate (AUGMENTIN) 875-125 MG per tablet; Take 1 tablet by mouth 2 (two) times daily.  Dispense: 20 tablet; Refill: 0

## 2013-06-28 ENCOUNTER — Telehealth: Payer: Self-pay | Admitting: Cardiovascular Disease

## 2013-06-28 ENCOUNTER — Encounter: Payer: Self-pay | Admitting: Cardiology

## 2013-06-28 ENCOUNTER — Encounter (HOSPITAL_COMMUNITY): Payer: Self-pay | Admitting: Pharmacy Technician

## 2013-06-28 ENCOUNTER — Other Ambulatory Visit: Payer: Self-pay | Admitting: *Deleted

## 2013-06-28 ENCOUNTER — Ambulatory Visit (INDEPENDENT_AMBULATORY_CARE_PROVIDER_SITE_OTHER): Payer: 59 | Admitting: Cardiology

## 2013-06-28 VITALS — BP 120/80 | HR 70 | Ht 68.0 in | Wt 267.0 lb

## 2013-06-28 DIAGNOSIS — E785 Hyperlipidemia, unspecified: Secondary | ICD-10-CM

## 2013-06-28 DIAGNOSIS — I200 Unstable angina: Secondary | ICD-10-CM

## 2013-06-28 DIAGNOSIS — R079 Chest pain, unspecified: Secondary | ICD-10-CM | POA: Insufficient documentation

## 2013-06-28 DIAGNOSIS — G4733 Obstructive sleep apnea (adult) (pediatric): Secondary | ICD-10-CM

## 2013-06-28 DIAGNOSIS — I4510 Unspecified right bundle-branch block: Secondary | ICD-10-CM

## 2013-06-28 DIAGNOSIS — Z01818 Encounter for other preprocedural examination: Secondary | ICD-10-CM

## 2013-06-28 DIAGNOSIS — I2510 Atherosclerotic heart disease of native coronary artery without angina pectoris: Secondary | ICD-10-CM

## 2013-06-28 DIAGNOSIS — Z01811 Encounter for preprocedural respiratory examination: Secondary | ICD-10-CM

## 2013-06-28 DIAGNOSIS — I10 Essential (primary) hypertension: Secondary | ICD-10-CM

## 2013-06-28 MED ORDER — ISOSORBIDE MONONITRATE ER 30 MG PO TB24: 30.0000 mg | ORAL_TABLET | Freq: Every day | ORAL | Status: DC

## 2013-06-28 NOTE — Assessment & Plan Note (Signed)
Myoview low risk Feb 2014

## 2013-06-28 NOTE — Telephone Encounter (Signed)
Please call,not feeling good. He is having problems breathing,papatations and real tired.

## 2013-06-28 NOTE — Assessment & Plan Note (Signed)
He says he is compliant with C-pap

## 2013-06-28 NOTE — Progress Notes (Signed)
06/28/2013 Lee [name]   09-28-1950  [id number]  [name], [place] Primary Cardiologist: Dr Lee [name]  HPI:  63 y/o followed in the past by Dr Lee [name] and now by Dr Lee [name]. He has a history of CAD as described. He is in there office today as an add on for chest pain. He describes SSCP "pressure" and dyspnea. He is pretty sedentary so its not clear if his symptoms are exertional. He did take a NTG this am with improvement.    Current Outpatient Prescriptions  Medication Sig Dispense Refill  . amoxicillin-clavulanate (AUGMENTIN) 875-125 MG per tablet Take 1 tablet by mouth 2 (two) times daily.  20 tablet  0  . aspirin 81 MG tablet Take 81 mg by mouth daily.      . baclofen (LIORESAL) 10 MG tablet Take 10 mg by mouth as needed for muscle spasms.      Marland Kitchen BYSTOLIC 10 MG tablet TAKE 1 TABLET BY MOUTH DAILY  30 tablet  6  . cholecalciferol (VITAMIN D) 1000 UNITS tablet Take 1,000 Units by mouth daily.      . citalopram (CELEXA) 40 MG tablet Take 1 tablet (40 mg total) by mouth daily.  30 tablet  6  . clopidogrel (PLAVIX) 75 MG tablet TAKE 1 TABLET BY MOUTH DAILY  30 tablet  6  . co-enzyme Q-10 30 MG capsule Take 30 mg by mouth 3 (three) times daily.      . CRESTOR 20 MG tablet TAKE 1 TABLET BY MOUTH DAILY  30 tablet  6  . fluticasone (FLONASE) 50 MCG/ACT nasal spray Place 2 sprays into the nose at bedtime.  16 g  11  . furosemide (LASIX) 40 MG tablet TAKE 1 TABLET BY MOUTH EVERY DAY  30 tablet  2  . nitroGLYCERIN (NITROSTAT) 0.4 MG SL tablet Place 1 tablet (0.4 mg total) under the tongue every 5 (five) minutes as needed. Chest pain  25 tablet  3  . oxyCODONE-acetaminophen (PERCOCET) 10-325 MG per tablet Take 1 tablet by mouth every 6 (six) hours as needed for pain.  30 tablet  0  . pantoprazole (PROTONIX) 40 MG tablet TAKE ONE TABLET BY MOUTH DAILY  30 tablet  5  . potassium chloride (MICRO-K) 10 MEQ CR capsule Take 1 capsule by mouth daily.       Marland Kitchen telmisartan (MICARDIS) 40 MG  tablet TAKE 1 TABLET BY MOUTH DAILY  30 tablet  5  . triazolam (HALCION) 0.25 MG tablet Take 0.25 mg by mouth at bedtime as needed for sleep.      Marland Kitchen UNABLE TO FIND CPAP therapy      . isosorbide mononitrate (IMDUR) 30 MG 24 hr tablet Take 1 tablet (30 mg total) by mouth daily.  90 tablet  3   No current facility-administered medications for this visit.    Allergies  Allergen Reactions  . Other Shortness Of Breath    BETA BLOCKERS  . Ibuprofen Swelling  . Toprol Xl [Metoprolol Succinate] Other (See Comments)    Personality change    History   Social History  . Marital Status: Married    Spouse Name: N/A    Number of Children: 2  . Years of Education: N/A   Occupational History  . retired    Social History Main Topics  . Smoking status: Former Smoker    Quit date: 06/01/1987  . Smokeless tobacco: Never Used  . Alcohol Use: Yes     Comment: once every 6-7 months  .  Drug Use: No  . Sexual Activity: Not on file   Other Topics Concern  . Not on file   Social History Narrative  . No narrative on file     Review of Systems: General: negative for chills, fever, night sweats or weight changes.  Cardiovascular: negative for chest pain, dyspnea on exertion, edema, orthopnea, palpitations, paroxysmal nocturnal dyspnea or shortness of breath Dermatological: negative for rash Respiratory: negative for cough or wheezing Urologic: negative for hematuria Abdominal: negative for nausea, vomiting, diarrhea, bright red blood per rectum, melena, or hematemesis Neurologic: negative for visual changes, syncope, or dizziness All other systems reviewed and are otherwise negative except as noted above.    Blood pressure 120/80, pulse 70, height 5\' 8"  (1.727 m), weight 267 lb (121.11 kg).  General appearance: alert, cooperative, no distress and morbidly obese Neck: no carotid bruit and no JVD Lungs: clear to auscultation bilaterally Heart: regular rate and rhythm Abdomen:  obese Extremities: no edema Pulses: good radial pulses Skin: cool and dry Neurologic: Grossly normal  EKG NSR RBBB  ASSESSMENT AND PLAN:   Chest pain with high risk of acute coronary syndrome Chest pressure, dyspnea, 7-10 days, NTG helped this am.  CAD- LAD stent '09, and staged RCA and new LAD site in 2012 Myoview low risk Feb 2014  OSA (obstructive sleep apnea) He says he is compliant with C-pap  RBBB .  DYSLIPIDEMIA .  HYPERTENSION .   PLAN  I discussed Lee [name]'s symptoms with Dr Lee [name]. It seems prudent to proceed with a diagnostic cath ASAP. We added Imdur 30 mg to his medications. It would probably be best to do him radially with his morbid obesity.   Valaree Fresquez KPA-C 06/28/2013 5:13 PM

## 2013-06-28 NOTE — Telephone Encounter (Signed)
Returned call.  Left message to call back before 4pm.  

## 2013-06-28 NOTE — Telephone Encounter (Signed)
Returned call and pt verified x 2.  Pt c/o intermittent "problems breathing" and pressure in his chest.  Stated he also feels real tired.  Pt stated this has been going on x 1 mo.  Pt also c/o having palpitations.  Stated they had gone away when he started his CPAP, but have been back for about a month.  Pt c/o chest pressure today and SOB w/ palpitations today.  Rated pressure 3/10 on pain scale.  Pt denied taking NTG and pt asked to take one now.  Pt agreed.  After ~2 mins pt c/o feeling the pressure ease off.  Pt stated the pressure is better and the breathing is better.  Stated he can feel it working.  Pt informed he has NTG for CP/pressure and it is important to take it when he needs it.     Advice  Take NTG as need  ER if 3 NTG needed in 15 mins or CP/presure lasting >20 mins  Call back for changes by 2pm for appt today (pt decided he did not want appt as he is feeling better).  Pt informed provider will be notified for further instructions since he decided against coming in today.  Pt verbalized understanding and agreed w/ plan.   Dr. Sallyanne Kuster (DOD) notified and advised pt schedule f/u appt for evaluation if he is having chest pressure w/o exertion.  Returned call and informed pt per instructions by MD/PA.  Pt verbalized understanding and agreed w/ plan.

## 2013-06-28 NOTE — Patient Instructions (Signed)

## 2013-06-28 NOTE — Telephone Encounter (Signed)
Returned your call .Marland Kitchen Please Call  Thanks

## 2013-06-28 NOTE — Assessment & Plan Note (Signed)
Chest pressure, dyspnea, 7-10 days, NTG helped this am.

## 2013-06-29 ENCOUNTER — Encounter (HOSPITAL_COMMUNITY)
Admission: RE | Disposition: A | Discharge status: Home / Self Care | Payer: Self-pay | Source: Ambulatory Visit | Attending: Cardiology

## 2013-06-29 ENCOUNTER — Ambulatory Visit (HOSPITAL_COMMUNITY)
Admission: RE | Admit: 2013-06-29 | Discharge: 2013-06-29 | Disposition: A | Discharge status: Home / Self Care | Payer: 59 | Source: Ambulatory Visit | Attending: Cardiology | Admitting: Cardiology

## 2013-06-29 DIAGNOSIS — R5383 Other fatigue: Secondary | ICD-10-CM

## 2013-06-29 DIAGNOSIS — I4510 Unspecified right bundle-branch block: Secondary | ICD-10-CM | POA: Insufficient documentation

## 2013-06-29 DIAGNOSIS — R002 Palpitations: Secondary | ICD-10-CM

## 2013-06-29 DIAGNOSIS — R079 Chest pain, unspecified: Secondary | ICD-10-CM

## 2013-06-29 DIAGNOSIS — G4733 Obstructive sleep apnea (adult) (pediatric): Secondary | ICD-10-CM | POA: Insufficient documentation

## 2013-06-29 DIAGNOSIS — J4489 Other specified chronic obstructive pulmonary disease: Secondary | ICD-10-CM | POA: Insufficient documentation

## 2013-06-29 DIAGNOSIS — E785 Hyperlipidemia, unspecified: Secondary | ICD-10-CM | POA: Insufficient documentation

## 2013-06-29 DIAGNOSIS — Z01811 Encounter for preprocedural respiratory examination: Secondary | ICD-10-CM

## 2013-06-29 DIAGNOSIS — I2510 Atherosclerotic heart disease of native coronary artery without angina pectoris: Secondary | ICD-10-CM

## 2013-06-29 DIAGNOSIS — J449 Chronic obstructive pulmonary disease, unspecified: Secondary | ICD-10-CM | POA: Insufficient documentation

## 2013-06-29 DIAGNOSIS — Z7982 Long term (current) use of aspirin: Secondary | ICD-10-CM | POA: Insufficient documentation

## 2013-06-29 DIAGNOSIS — I4891 Unspecified atrial fibrillation: Secondary | ICD-10-CM

## 2013-06-29 DIAGNOSIS — Z9861 Coronary angioplasty status: Secondary | ICD-10-CM | POA: Insufficient documentation

## 2013-06-29 DIAGNOSIS — Z01818 Encounter for other preprocedural examination: Secondary | ICD-10-CM

## 2013-06-29 DIAGNOSIS — I10 Essential (primary) hypertension: Secondary | ICD-10-CM

## 2013-06-29 DIAGNOSIS — Z87891 Personal history of nicotine dependence: Secondary | ICD-10-CM | POA: Insufficient documentation

## 2013-06-29 DIAGNOSIS — I209 Angina pectoris, unspecified: Secondary | ICD-10-CM | POA: Insufficient documentation

## 2013-06-29 HISTORY — PX: LEFT HEART CATHETERIZATION WITH CORONARY ANGIOGRAM: SHX5451

## 2013-06-29 LAB — BASIC METABOLIC PANEL
BUN: 12 mg/dL (ref 6–23)
CALCIUM: 8.8 mg/dL (ref 8.4–10.5)
CO2: 24 mEq/L (ref 19–32)
Chloride: 97 mEq/L (ref 96–112)
Creatinine, Ser: 0.81 mg/dL (ref 0.50–1.35)
GFR calc non Af Amer: >90 mL/min (ref >90)
GLUCOSE: 103 mg/dL — AB (ref 70–99)
Potassium: 4.2 mEq/L (ref 3.7–5.3)
SODIUM: 136 meq/L — AB (ref 137–147)

## 2013-06-29 LAB — CBC
HCT: 37.2 % — ABNORMAL LOW (ref 39.0–52.0)
HEMOGLOBIN: 12.6 g/dL — AB (ref 13.0–17.0)
MCH: 29.4 pg (ref 26.0–34.0)
MCHC: 33.9 g/dL (ref 30.0–36.0)
MCV: 86.7 fL (ref 78.0–100.0)
PLATELETS: 224 10*3/uL (ref 150–400)
RBC: 4.29 MIL/uL (ref 4.22–5.81)
RDW: 12.6 % (ref 11.5–15.5)
WBC: 9.4 10*3/uL (ref 4.0–10.5)

## 2013-06-29 LAB — PROTIME-INR
INR: 1 (ref 0.00–1.49)
PROTHROMBIN TIME: 13 s (ref 11.6–15.2)

## 2013-06-29 SURGERY — LEFT HEART CATHETERIZATION WITH CORONARY ANGIOGRAM
Anesthesia: LOCAL

## 2013-06-29 MED ORDER — ASPIRIN 81 MG PO CHEW: 81.0000 mg | CHEWABLE_TABLET | ORAL | Status: DC

## 2013-06-29 MED ORDER — BACLOFEN 10 MG PO TABS: 10.0000 mg | ORAL_TABLET | Freq: Every day | ORAL | Status: DC | PRN

## 2013-06-29 MED ORDER — ONDANSETRON HCL 4 MG/2ML IJ SOLN: 4.0000 mg | Freq: Four times a day (QID) | INTRAMUSCULAR | Status: DC | PRN

## 2013-06-29 MED ORDER — ACETAMINOPHEN 325 MG PO TABS
650.0000 mg | ORAL_TABLET | ORAL | Status: DC | PRN
  Administered 2013-06-29: 650 mg via ORAL

## 2013-06-29 MED ORDER — FENTANYL CITRATE 0.05 MG/ML IJ SOLN
INTRAMUSCULAR | Status: AC
  Filled 2013-06-29: qty 2

## 2013-06-29 MED ORDER — HEPARIN (PORCINE) IN NACL 2-0.9 UNIT/ML-% IJ SOLN
INTRAMUSCULAR | Status: AC
  Filled 2013-06-29: qty 1000

## 2013-06-29 MED ORDER — NITROGLYCERIN 0.4 MG SL SUBL: 0.4000 mg | SUBLINGUAL_TABLET | SUBLINGUAL | Status: DC | PRN

## 2013-06-29 MED ORDER — VERAPAMIL HCL 2.5 MG/ML IV SOLN
INTRAVENOUS | Status: AC
  Filled 2013-06-29: qty 2

## 2013-06-29 MED ORDER — MIDAZOLAM HCL 2 MG/2ML IJ SOLN
INTRAMUSCULAR | Status: AC
  Filled 2013-06-29: qty 2

## 2013-06-29 MED ORDER — LIDOCAINE HCL (PF) 1 % IJ SOLN
INTRAMUSCULAR | Status: AC
  Filled 2013-06-29: qty 30

## 2013-06-29 MED ORDER — OXYCODONE-ACETAMINOPHEN 10-325 MG PO TABS: 1.0000 | ORAL_TABLET | Freq: Four times a day (QID) | ORAL | Status: DC | PRN

## 2013-06-29 MED ORDER — SODIUM CHLORIDE 0.9 % IV SOLN
INTRAVENOUS | Status: DC
  Administered 2013-06-29: 06:00:00 via INTRAVENOUS

## 2013-06-29 MED ORDER — SODIUM CHLORIDE 0.9 % IJ SOLN: 3.0000 mL | INTRAMUSCULAR | Status: DC | PRN

## 2013-06-29 MED ORDER — HEPARIN SODIUM (PORCINE) 1000 UNIT/ML IJ SOLN
INTRAMUSCULAR | Status: AC
  Filled 2013-06-29: qty 1

## 2013-06-29 NOTE — CV Procedure (Signed)
CARDIAC CATHETERIZATION REPORT  NAME:  Lee [name]   MRN: [id number] DOB:  1951/03/25   ADMIT DATE: 06/29/2013 Procedure Date: 06/29/2013  INTERVENTIONAL CARDIOLOGIST: Leonie Man, M.D., MS PRIMARY CARE PROVIDER: Odette Fraction, MD PRIMARY CARDIOLOGIST: Troy Sine., M.D.  PATIENT:  Lee [name] is a 63 y.o. male with a history of CAD with PCI to the RCA and LAD back in January of 2012. This was done as a staged procedure. He is relatively sedentary gentleman, with a history of hypertension, hyperlipidemia, COPD, OSA and obesity. He was seen by Mr. [name] in clinic [facility] clinic for complaints of chest discomfort described as substernal chest pressure along with dyspnea. It was unclear whether this was truly resting versus exertional. He was then seen by Dr. Sallyanne Kuster who felt that the best course of action would be to proceed with invasive evaluation of his coronary disease. He is now referred for cardiac catheterization.Marland Kitchen  PRE-OPERATIVE DIAGNOSIS:    CCS Class IV Angina.  Known CAD status post PCI  PROCEDURES PERFORMED:    Left Heart Catheterization with Native Coronary Angiography  PROCEDURE:Consent:  Risks of procedure as well as the alternatives and risks of each were explained to the (patient/caregiver).  Consent for procedure obtained. Consent for signed by MD and patient with RN witness -- placed on chart.   PROCEDURE: The patient was brought to the [facility]nd [facility] in the fasting state and prepped and draped in the usual sterile fashion for Right groin or radial access. A modified Allen's test with plethysmography was performed, revealing excellent Ulnar artery collateral flow.  Sterile technique was used including antiseptics, cap, gloves, gown, hand hygiene, mask and sheet.  Skin prep: Chlorhexidine.  Time Out: Verified patient identification, verified procedure, site/side was marked, verified  correct patient position, special equipment/implants available, medications/allergies/relevent history reviewed, required imaging and test results available.  Performed  Access: Right Radial Artery; 6 Fr Sheath -- Seldinger technique (Angiocath Micropuncture Kit)  IA Radial Cocktail, IV Heparin Left Heart Catheterization/Angiography:  A 5 French TIG 4.0 and a Angled Pigtail catheter were advanced and exchanged over long exchange safety J-wire.  Left And Right Coronary Artery Angiography: TIG 4.0  LV Hemodynamics (LV Gram): Angled pigtail (did TIG 4.0 catheter was used to cross the valve and exchanged over long exchange wire with the wire in the ventricle)  TR Band:  0835 Hours, 11 mL air  MEDICATIONS:  Anesthesia:  Local Lidocaine 2 ml  Sedation:  2 mg IV Versed, 25 mcg IV fentanyl ;   Premedication: 5 mg oral Valium  Omnipaque Contrast: 75 ml  Anticoagulation:  IV Heparin 6000 Units Radial Cocktail: 5 mg Verapamil, 400 mcg NTG, 2 ml 2% Lidocaine in 10 ml NS  Hemodynamics:  Central Aortic / Mean Pressures: 101/67 mmHg; 86 mmHg  Left Ventricular Pressures / EDP: 107/3 mmHg; 15 mmHg  Left Ventriculography:  EF: 65 %  Wall Motion: Normal  Coronary Anatomy:  Left Main: Large-caliber vessel that bifurcates into the LAD, and Circumflex. Angiographically normal. LAD: Large-caliber vessel with patent stent proximally with a roughly 20% irregular in-stent re-stenosis. The mid stent also had tubular 40% in-stent restenosis. The remainder the vessel is relatively free of disease it wraps the apex.  D1: Small-caliber vessel, jailed by the stent. No significant lesions.  Left Circumflex: Large-caliber vessel, angiographically normal. It actually courses as a large lateral OM. They're several small branches of the small AV groove branch  RCA: Large caliber, dominant vessel with a roughly 40% narrowing just proximal to the widely patent stent in the mid vessel the it then courses  down around 2 terminates as The Right Posterior Descending Artery prior to giving off the Right Posterior AV Groove Branch (RPAV). Beyond the patent stent, no significant lesions noted.  RPDA: Moderate-caliber vessel that reaches almost to the apex. Tortuous but free of disease.  RPL Sysytem:The RPAV moderate caliber vessel that terminates as a single posterior lateral branches several small branches. Minimal luminal irregularity.  PATIENT DISPOSITION:    The patient was transferred to the PACU holding area in a hemodynamicaly stable, chest pain free condition.  The patient tolerated the procedure well, and there were no complications.  EBL:   < 10 ml  The patient was stable before, during, and after the procedure.  POST-OPERATIVE DIAGNOSIS:    No angiographic evidence of significant CAD. If there is concern for follow on symptoms, would consider Myoview stress test to assess the LAD stent in stent restenosis.  Preserved EF with elevated EDP.  PLAN OF CARE:  Standard post radial cath care with plan to discharge later today.  He'll followup with either Dr. Claiborne Billings or an APP at the [facility] office.   Dr. Claiborne Billings and informed of the results the study and the family has been updated.   Leonie Man, M.D., M.S. [facility] [address] [place], [place]  [address]  [phone]  06/29/2013 8:36 AM

## 2013-06-29 NOTE — H&P (View-Only) (Signed)
06/28/2013 Lee [name]   09-28-1950  [id number]  [name], MD Primary Cardiologist: Dr Lee [name]  HPI:  63 y/o followed in the past by Dr Lee [name] and now by Dr Lee [name]. He has a history of CAD as described. He is in there office today as an add on for chest pain. He describes SSCP "pressure" and dyspnea. He is pretty sedentary so its not clear if his symptoms are exertional. He did take a NTG this am with improvement.    Current Outpatient Prescriptions  Medication Sig Dispense Refill  . amoxicillin-clavulanate (AUGMENTIN) 875-125 MG per tablet Take 1 tablet by mouth 2 (two) times daily.  20 tablet  0  . aspirin 81 MG tablet Take 81 mg by mouth daily.      . baclofen (LIORESAL) 10 MG tablet Take 10 mg by mouth as needed for muscle spasms.      Marland Kitchen BYSTOLIC 10 MG tablet TAKE 1 TABLET BY MOUTH DAILY  30 tablet  6  . cholecalciferol (VITAMIN D) 1000 UNITS tablet Take 1,000 Units by mouth daily.      . citalopram (CELEXA) 40 MG tablet Take 1 tablet (40 mg total) by mouth daily.  30 tablet  6  . clopidogrel (PLAVIX) 75 MG tablet TAKE 1 TABLET BY MOUTH DAILY  30 tablet  6  . co-enzyme Q-10 30 MG capsule Take 30 mg by mouth 3 (three) times daily.      . CRESTOR 20 MG tablet TAKE 1 TABLET BY MOUTH DAILY  30 tablet  6  . fluticasone (FLONASE) 50 MCG/ACT nasal spray Place 2 sprays into the nose at bedtime.  16 g  11  . furosemide (LASIX) 40 MG tablet TAKE 1 TABLET BY MOUTH EVERY DAY  30 tablet  2  . nitroGLYCERIN (NITROSTAT) 0.4 MG SL tablet Place 1 tablet (0.4 mg total) under the tongue every 5 (five) minutes as needed. Chest pain  25 tablet  3  . oxyCODONE-acetaminophen (PERCOCET) 10-325 MG per tablet Take 1 tablet by mouth every 6 (six) hours as needed for pain.  30 tablet  0  . pantoprazole (PROTONIX) 40 MG tablet TAKE ONE TABLET BY MOUTH DAILY  30 tablet  5  . potassium chloride (MICRO-K) 10 MEQ CR capsule Take 1 capsule by mouth daily.       Marland Kitchen telmisartan (MICARDIS) 40 MG  tablet TAKE 1 TABLET BY MOUTH DAILY  30 tablet  5  . triazolam (HALCION) 0.25 MG tablet Take 0.25 mg by mouth at bedtime as needed for sleep.      Marland Kitchen UNABLE TO FIND CPAP therapy      . isosorbide mononitrate (IMDUR) 30 MG 24 hr tablet Take 1 tablet (30 mg total) by mouth daily.  90 tablet  3   No current facility-administered medications for this visit.    Allergies  Allergen Reactions  . Other Shortness Of Breath    BETA BLOCKERS  . Ibuprofen Swelling  . Toprol Xl [Metoprolol Succinate] Other (See Comments)    Personality change    History   Social History  . Marital Status: Married    Spouse Name: N/A    Number of Children: 2  . Years of Education: N/A   Occupational History  . retired    Social History Main Topics  . Smoking status: Former Smoker    Quit date: 06/01/1987  . Smokeless tobacco: Never Used  . Alcohol Use: Yes     Comment: once every 6-7 months  .  Drug Use: No  . Sexual Activity: Not on file   Other Topics Concern  . Not on file   Social History Narrative  . No narrative on file     Review of Systems: General: negative for chills, fever, night sweats or weight changes.  Cardiovascular: negative for chest pain, dyspnea on exertion, edema, orthopnea, palpitations, paroxysmal nocturnal dyspnea or shortness of breath Dermatological: negative for rash Respiratory: negative for cough or wheezing Urologic: negative for hematuria Abdominal: negative for nausea, vomiting, diarrhea, bright red blood per rectum, melena, or hematemesis Neurologic: negative for visual changes, syncope, or dizziness All other systems reviewed and are otherwise negative except as noted above.    Blood pressure 120/80, pulse 70, height 5' 8" (1.727 m), weight 267 lb (121.11 kg).  General appearance: alert, cooperative, no distress and morbidly obese Neck: no carotid bruit and no JVD Lungs: clear to auscultation bilaterally Heart: regular rate and rhythm Abdomen:  obese Extremities: no edema Pulses: good radial pulses Skin: cool and dry Neurologic: Grossly normal  EKG NSR RBBB  ASSESSMENT AND PLAN:   Chest pain with high risk of acute coronary syndrome Chest pressure, dyspnea, 7-10 days, NTG helped this am.  CAD- LAD stent '09, and staged RCA and new LAD site in 2012 Myoview low risk Feb 2014  OSA (obstructive sleep apnea) He says he is compliant with C-pap  RBBB .  DYSLIPIDEMIA .  HYPERTENSION .   PLAN  I discussed Lee [name]'s symptoms with Dr Lee [name]. It seems prudent to proceed with a diagnostic cath ASAP. We added Imdur 30 mg to his medications. It would probably be best to do him radially with his morbid obesity.   Lee [name] KPA-C 06/28/2013 5:13 PM 

## 2013-06-29 NOTE — Progress Notes (Signed)
Discharge instruction given to pt and spouse both verbalize understanding. Pt up to bathroom tolerated well. Discharge home via wheelchair

## 2013-06-29 NOTE — Discharge Instructions (Signed)

## 2013-06-29 NOTE — Interval H&P Note (Signed)
History and Physical Interval Note:  06/29/2013 7:50 AM  Lytle Michaels  has presented today for surgery, with the diagnosis of Chest pain - CCS Class IV Angina. The various methods of treatment have been discussed with the patient and family.  After consideration of risks, benefits and other options for treatment, the patient has consented to  Procedure(s): LEFT HEART CATHETERIZATION WITH CORONARY ANGIOGRAM (N/A)  +/- PCI. a surgical intervention .  The patient's history has been reviewed, patient examined, no change in status, stable for surgery.  I have reviewed the patient's chart and labs.  Questions were answered to the patient's satisfaction.     [facility]  Cath Lab Visit (complete for each Cath Lab visit)  Clinical Evaluation Leading to the Procedure:   ACS: no  Non-ACS:    Anginal Classification: CCS IV  Anti-ischemic medical therapy: Maximal Therapy (2 or more classes of medications)  Non-Invasive Test Results: No non-invasive testing performed  Prior CABG: No previous CABG

## 2013-07-02 ENCOUNTER — Other Ambulatory Visit: Payer: Self-pay

## 2013-07-02 MED ORDER — POTASSIUM CHLORIDE ER 10 MEQ PO CPCR: 10.0000 meq | ORAL_CAPSULE | Freq: Every day | ORAL | Status: DC

## 2013-07-02 NOTE — Telephone Encounter (Signed)
Rx was sent to pharmacy electronically. 

## 2013-07-06 ENCOUNTER — Telehealth: Payer: Self-pay | Admitting: Family Medicine

## 2013-07-06 NOTE — Telephone Encounter (Signed)
?   OK to Refill  

## 2013-07-06 NOTE — Telephone Encounter (Signed)
PT is needing a refill on his generic percocet Call back number is [phone]

## 2013-07-10 MED ORDER — OXYCODONE-ACETAMINOPHEN 10-325 MG PO TABS: 1.0000 | ORAL_TABLET | Freq: Four times a day (QID) | ORAL | Status: DC | PRN

## 2013-07-10 NOTE — Telephone Encounter (Signed)
ok 

## 2013-07-10 NOTE — Telephone Encounter (Signed)
RX printed, left up front and patient aware to pick up  

## 2013-07-19 ENCOUNTER — Encounter: Payer: Self-pay | Admitting: Cardiovascular Disease

## 2013-07-19 ENCOUNTER — Ambulatory Visit (INDEPENDENT_AMBULATORY_CARE_PROVIDER_SITE_OTHER): Payer: 59 | Admitting: Cardiovascular Disease

## 2013-07-19 VITALS — BP 106/70 | HR 66 | Ht 68.0 in | Wt 270.9 lb

## 2013-07-19 DIAGNOSIS — G4733 Obstructive sleep apnea (adult) (pediatric): Secondary | ICD-10-CM

## 2013-07-19 DIAGNOSIS — I4891 Unspecified atrial fibrillation: Secondary | ICD-10-CM

## 2013-07-19 DIAGNOSIS — Z87898 Personal history of other specified conditions: Secondary | ICD-10-CM

## 2013-07-19 DIAGNOSIS — I2510 Atherosclerotic heart disease of native coronary artery without angina pectoris: Secondary | ICD-10-CM

## 2013-07-19 DIAGNOSIS — I4510 Unspecified right bundle-branch block: Secondary | ICD-10-CM

## 2013-07-19 DIAGNOSIS — I10 Essential (primary) hypertension: Secondary | ICD-10-CM

## 2013-07-19 DIAGNOSIS — E785 Hyperlipidemia, unspecified: Secondary | ICD-10-CM

## 2013-07-19 DIAGNOSIS — R002 Palpitations: Secondary | ICD-10-CM

## 2013-07-19 MED ORDER — AMLODIPINE BESYLATE 2.5 MG PO TABS: 2.5000 mg | ORAL_TABLET | Freq: Every day | ORAL | Status: DC

## 2013-07-19 NOTE — Progress Notes (Signed)
[name] ID: Lee [name], male   DOB: Jan 16, 1951, 63 y.o.   MRN: [id number]      HPI: Lee [name], is a 63 y.o. male who presents to Lee office today for followup cardiology evaluation following his recent cardiac catheterization by Dr. Ellyn Hack.  Lee [name] has known coronary artery disease and  in 2009 underwent stenting of his mid LAD. In 2012 Lee [name] underwent staged intervention with stenting of his proximal RCA at a new site in his proximal LAD. A nuclear perfusion study February 2014 showed normal perfusion and function. In 2007, Lee [name] did have a sleep study which suggested upper airway resistance syndrome without overall sleep apnea with Lee exception of mild sleep apnea with REM sleep at that time CPAP was not recommended.  I last saw Lee [name] on 11/06/2012. At that time, Lee [name] was having symptoms suggestive of progressive sleep apnea. Lee [name] ultimately underwent a sleep study. I do not have Lee specifics of this diagnostic polysomnogram but subsequently Lee [name] did undergo a CPAP titration to apparently his AHI was 12 on his initial study and Lee [name] dropped his oxygen from 96% to 83%. Lee [name] ultimately was titrated up to a CPAP pressure of 11 cm.  His palpitations hade improved on his increased dose of Bystolic which was titrated to 10 mg.  When I saw him in July I also further titrate his Crestor to 20 mg.    In January, Lee [name] was seen by leucovorin in Lee office with complaints of chest pressure. Lee [name] apparently underwent cardiac catheterization this chest pain which was done by Dr. [name] on 06/29/2013. Catheterization did not demonstrate significant restenosis of his LAD stents with mild 20% narrowing in Lee proximal stent and 40% mid stent. Lee circumflex was normal. His right artery had 40% narrowing just proximal to a widely patent stent in Lee mid vessel. Nitrate therapy was added to his medical regimen. Lee [name] has not had further chest pain since. Lee [name] presents for followup evaluation.  Lee [name] does have  a history of migraine headaches. Lee [name] complains now of almost daily headaches since being on nitrate therapy.  Lee [name] denies any weight loss. Review of his chart indicates a 33 pound weight gain since July 2013. Lee [name] has been using CPAP therapy. Past Medical History  Diagnosis Date  . Arthritis   . Hyperlipidemia   . Hypertension   . COPD (chronic obstructive pulmonary disease)   . PVC's (premature ventricular contractions)   . Paroxysmal atrial fibrillation     s/p afib ablation 10/22/08  . CAD (coronary artery disease)     PCI RCA and LAD 1/12  . DJD (degenerative joint disease)   . Migraines   . Iron deficiency anemia   . Esophagitis 1991    grade 1  . Diverticulosis   . Hemorrhoids   . GERD (gastroesophageal reflux disease)     Hiatal hernia  . Allergic rhinitis     chronic sinusitis  . Cataract     has had lasik surg  . History of stress test 07/2012    normal prefusion and function with out scar or ischemia  . Sleep apnea     Questionalble: RDI during Lee total sleep time 6h 28 mins was 3.55/hr during REM sleep was at 5.71/hr. Supine AHI was 5.93/hr.    Past Surgical History  Procedure Laterality Date  . Lasik    . Nasal sinus surgery  2001  . Coronary angioplasty with stent placement      3 stents/ 4  caths  . Knee arthroscopy      right  . Neck surgery      Cervical fusion  . Shoulder open rotator cuff repair      right  . Rotator cuff repair      left  . Wrist arthrocentesis      left  . Spine surgery    . Radiofrequency ablation  May 2010    atrial fibrillation  . Eye surgery    . Cardiac catheterization  05/2010    Lee proximal LAD was then predilated with 2.0 x 12 trek. this was then stented with a 2.5 x 16 promus Element drug-eluting stent at 14 atmosphere and prostdilated with 2.75 x 12 [place] trek at 16 atmospheres(2.8 mm) resulting Lee reduction of 80% proximal LAD stenosis to 0% residual with excellent flow.  . Cardiac catheterization  06/05/2010     Successful percutaneous coronary intervention to Lee right coronary artery with percutaneous transluminal coronary angioplasty/stenting and insertion 3.0 x 16 mm Promus DES post dilated to 3.35 mm with stenosis being reduced to 0%  . Cardiac catheterization  02/2008    Post dilatation was performed using a 2.75 x 9 [place] sprinter, 10 atmospheres for 40 seconds and then 9 atmosphere for 35 sec. This resulted in Lee 80% area of narrowing pre-intervention, now appearing to normal. There was no edvidence of Lee dissection or thrombus and there was TIMI III flow pre and post.  . Cardiac catheterization  02/2006    Allergies  Allergen Reactions  . Other Shortness Of Breath    BETA BLOCKERS  . Ibuprofen Swelling  . Toprol Xl [Metoprolol Succinate] Other (See Comments)    Personality change    Current Outpatient Prescriptions  Medication Sig Dispense Refill  . aspirin 81 MG tablet Take 81 mg by mouth daily.      . cholecalciferol (VITAMIN D) 1000 UNITS tablet Take 1,000 Units by mouth daily.      . citalopram (CELEXA) 40 MG tablet Take 1 tablet (40 mg total) by mouth daily.  30 tablet  6  . clopidogrel (PLAVIX) 75 MG tablet Take 75 mg by mouth daily with breakfast.      . fluticasone (FLONASE) 50 MCG/ACT nasal spray Place 2 sprays into Lee nose at bedtime.  16 g  11  . furosemide (LASIX) 40 MG tablet Take 40 mg by mouth daily.      . isosorbide mononitrate (IMDUR) 30 MG 24 hr tablet Take 1 tablet (30 mg total) by mouth daily.  90 tablet  3  . nebivolol (BYSTOLIC) 10 MG tablet Take 10 mg by mouth daily.      . nitroGLYCERIN (NITROSTAT) 0.4 MG SL tablet Place 1 tablet (0.4 mg total) under Lee tongue every 5 (five) minutes as needed. Chest pain  25 tablet  3  . oxyCODONE-acetaminophen (PERCOCET) 10-325 MG per tablet Take 1 tablet by mouth every 6 (six) hours as needed for pain.  30 tablet  0  . pantoprazole (PROTONIX) 40 MG tablet Take 40 mg by mouth daily.      . potassium chloride (MICRO-K) 10 MEQ CR  capsule Take 1 capsule (10 mEq total) by mouth daily.  30 capsule  11  . rosuvastatin (CRESTOR) 20 MG tablet Take 20 mg by mouth daily.      Marland Kitchen telmisartan (MICARDIS) 40 MG tablet Take 40 mg by mouth daily.      . triazolam (HALCION) 0.25 MG tablet Take 0.25 mg by mouth at bedtime as  needed for sleep.      Marland Kitchen UNABLE TO FIND CPAP therapy       No current facility-administered medications for this visit.    Socially Lee [name]'s married has 2 children Lee [name] works in Development worker, international aid. There is a remote tobacco history but Lee [name] quit in 1989.  ROS is negative for fevers, chills or night sweats. Lee [name] does have a history of migraine headaches and these have increased since isosorbide mononitrate was started. Lee [name] denies visual changes. Denies change in hearing. Denies of adenopathy.  Lee [name] denies recurrent chest pain Lee Lee [name] does have difficulty with hearing and has bilateral hearing aids. Lee [name] denies palpitations. There is no PND or orthopnea. Lee [name] denies wheezing or increased shortness of breath. There is no chest pain. Lee [name] denies abdominal pain. There is no nausea or vomiting. There is no blood in stool or urine. Lee [name] denies significant musculoskeletal symptoms. There is no edema. Lee [name] denies recent myalgias and now seems to be tolerating Crestor. Lee [name] denies paresthesias. Lee [name] does have chronic right upper bundle branch block. There is no diabetes or thyroid issues. Other comprehensive 14 point system review is negative..  PE BP 106/70  Pulse 66  Ht 5\' 8"  (1.727 m)  Wt 270 lb 14.4 oz (122.879 kg)  BMI 41.20 kg/m2  General: Alert, oriented, no distress. Morbid obesity Skin: normal turgor, no rashes HEENT: Normocephalic, atraumatic. Pupils round and reactive; sclera anicteric;no lid lag.  Nose without nasal septal hypertrophy Mouth/Parynx benign; Mallinpatti scale 3 Neck: Thick neck;No JVD, no carotid bruits with normal carotid upstrokes Lungs: clear to ausculatation and percussion; no wheezing or rales Heart: RRR, s1 s2 normal  1/6 sem Abdomen: soft, nontender; no hepatosplenomehaly, BS+; abdominal aorta nontender and not dilated by palpation. Pulses 2+ right radial cath site stable Extremities: no clubbing cyanosis or edema, Homan's sign negative  Neurologic: grossly nonfocal Psychological normal affect and mood  ECG (independently read by me): Normal sinus rhythm at 66 beats per minute, right bundle branch block with repolarization changes, first-degree AV block.  ECG from 12/12/2012: NSR at 69, RBBB  LABS:  BMET    Component Value Date/Time   NA 136* 06/29/2013 0623   K 4.2 06/29/2013 0623   CL 97 06/29/2013 0623   CO2 24 06/29/2013 0623   GLUCOSE 103* 06/29/2013 0623   BUN 12 06/29/2013 0623   CREATININE 0.81 06/29/2013 0623   CREATININE 0.87 05/03/2013 0915   CALCIUM 8.8 06/29/2013 0623   GFRNONAA >90 06/29/2013 0623   GFRAA >90 06/29/2013 0623     Hepatic Function Panel     Component Value Date/Time   PROT 6.7 05/03/2013 0915   ALBUMIN 4.1 05/03/2013 0915   AST 22 05/03/2013 0915   ALT 25 05/03/2013 0915   ALKPHOS 56 05/03/2013 0915   BILITOT 0.5 05/03/2013 0915     CBC    Component Value Date/Time   WBC 9.4 06/29/2013 0623   RBC 4.29 06/29/2013 0623   HGB 12.6* 06/29/2013 0623   HCT 37.2* 06/29/2013 0623   PLT 224 06/29/2013 0623   MCV 86.7 06/29/2013 0623   MCH 29.4 06/29/2013 0623   MCHC 33.9 06/29/2013 0623   RDW 12.6 06/29/2013 0623   LYMPHSABS 1.7 05/03/2013 0915   MONOABS 0.5 05/03/2013 0915   EOSABS 0.1 05/03/2013 0915   BASOSABS 0.0 05/03/2013 0915     BNP    Component Value Date/Time   PROBNP 36.0 06/04/2010 2206    Lipid Panel     Component Value Date/Time  CHOL 143 05/03/2013 0915   TRIG 63 05/03/2013 0915   TRIG 81 11/21/2012 1025   HDL 45 05/03/2013 0915   CHOLHDL 3.2 05/03/2013 0915   VLDL 13 05/03/2013 0915   LDLCALC 85 05/03/2013 0915   LDLCALC 94 11/21/2012 1025     RADIOLOGY: No results found.    ASSESSMENT AND PLAN: Lee [name] has known CAD and is status post  intervention to his proximal LAD and mid LAD as well as his RCA. Lee [name] has tolerated his increased dose of Bystolic and Crestor in Lee past.  Lee [name] recently developed chest pain symptoms and was referred for repeat cardiac catheterization which was done by Dr. Ellyn Hack. This did not demonstrate significant cardiac etiology to his chest pain. Lee [name] was empirically started on nitrate therapy but this has resulted in frequent migraine headaches. It is possible that some of his pain may have been due to spasm versus esophageal etiology or musculoskeletal symptomatology. I am electing to discontinue his isosorbide mononitrate. In its place else added amlodipine at very low dose 2.5 mg. I discussed Lee importance of weight loss since Lee [name] is over 70 pounds above Lee obesity threshold and is in Lee morbidly obese category. I did review his recent laboratory from 05/03/2013. Total cholesterol is excellent 143 triglycerides 63 HDL 45 LDL 85 on his current treatment. His blood pressure is stable and is not having ectopy. Lee [name] is using his CPAP therapy for his obstructive sleep apnea and will be having a followup sleep clinic appointment next month.   Troy Sine, MD, [facility]  07/19/2013 11:32 AM

## 2013-07-19 NOTE — Patient Instructions (Addendum)
Your physician recommends that you schedule a follow-up appointment in: 6 Months  Your physician has recommended you make the following change in your medication: Stop Isosorbide and Start Amlodipine 2.5 mg daily

## 2013-07-26 ENCOUNTER — Ambulatory Visit: Payer: 59 | Admitting: *Deleted

## 2013-08-31 ENCOUNTER — Other Ambulatory Visit: Payer: Self-pay | Admitting: Cardiovascular Disease

## 2013-09-03 ENCOUNTER — Other Ambulatory Visit: Payer: Self-pay

## 2013-09-03 NOTE — Telephone Encounter (Signed)
Pharmacy sent "second" refill request for Citalopram. See refill encounter from 08/31/2013 - refill refused, patient must request refills from Dr Jenna Luo (patient's PCP).  Refused refills - pharmacy notified.

## 2013-09-10 NOTE — Telephone Encounter (Signed)
Send to pcp

## 2013-10-01 ENCOUNTER — Other Ambulatory Visit: Payer: Self-pay | Admitting: Cardiovascular Disease

## 2013-10-14 ENCOUNTER — Other Ambulatory Visit: Payer: Self-pay | Admitting: Cardiovascular Disease

## 2013-10-15 NOTE — Telephone Encounter (Signed)
Rx was sent to pharmacy electronically. 

## 2013-10-16 ENCOUNTER — Ambulatory Visit (INDEPENDENT_AMBULATORY_CARE_PROVIDER_SITE_OTHER): Payer: 59 | Admitting: Podiatry

## 2013-10-16 ENCOUNTER — Encounter: Payer: Self-pay | Admitting: Podiatry

## 2013-10-16 VITALS — BP 133/67 | HR 70 | Resp 16

## 2013-10-16 DIAGNOSIS — Q828 Other specified congenital malformations of skin: Secondary | ICD-10-CM

## 2013-10-16 DIAGNOSIS — D239 Other benign neoplasm of skin, unspecified: Secondary | ICD-10-CM

## 2013-10-16 NOTE — Progress Notes (Signed)
He presents today for a chief complaint of painful fifth metatarsophalangeal joints bilaterally.  Objective: Pulses are palpable bilateral. Upon evaluation he has porokeratotic lesions to the plantar aspect of the fifth metatarsal bilateral. These are exquisitely painful for him.  Assessment: Porokeratosis plantar flexed fifth metatarsals bilateral.  Plan: Debridement of reactive hyperkeratosis sharply and with chemical destruction under occlusion. Salicylic acid was placed under occlusion for 3 days not to be wet and that he washed off and the third day. I will followup with him in the near future for repeat debridement.

## 2013-11-01 ENCOUNTER — Other Ambulatory Visit: Payer: Self-pay | Admitting: Cardiovascular Disease

## 2013-11-05 NOTE — Telephone Encounter (Signed)
Pharmacy notified to send refills to PCP (Dr. Jenna Luo)

## 2013-11-19 ENCOUNTER — Encounter: Payer: Self-pay | Admitting: Family Medicine

## 2013-11-19 ENCOUNTER — Ambulatory Visit (INDEPENDENT_AMBULATORY_CARE_PROVIDER_SITE_OTHER): Payer: 59 | Admitting: Family Medicine

## 2013-11-19 VITALS — BP 120/78 | HR 60 | Temp 97.3°F | Resp 16 | Ht 68.0 in | Wt 242.0 lb

## 2013-11-19 DIAGNOSIS — H8110 Benign paroxysmal vertigo, unspecified ear: Secondary | ICD-10-CM

## 2013-11-19 DIAGNOSIS — H8113 Benign paroxysmal vertigo, bilateral: Secondary | ICD-10-CM

## 2013-11-19 MED ORDER — MECLIZINE HCL 25 MG PO TABS: 25.0000 mg | ORAL_TABLET | Freq: Three times a day (TID) | ORAL | Status: DC | PRN

## 2013-11-19 NOTE — Progress Notes (Signed)
Subjective:    Patient ID: Lee [name], male    DOB: 01-12-1951, 63 y.o.   MRN: [id number]  HPI Patient presents with 4 days of dizziness. He reports vertigo-like symptoms whenever he changes positions. Particularly worse whenever he sits up or rolls over in bed. He denies any new tinnitus or hearing loss. He does have chronic hearing loss and chronic tinnitus but it has not changed recently. He also complains of frontal sinus pressure. He denies any ear pain. He denies any recent head injury. He denies any neurologic deficits or vision changes. Past Medical History  Diagnosis Date  . Arthritis   . Hyperlipidemia   . Hypertension   . COPD (chronic obstructive pulmonary disease)   . PVC's (premature ventricular contractions)   . Paroxysmal atrial fibrillation     s/p afib ablation 10/22/08  . CAD (coronary artery disease)     PCI RCA and LAD 1/12  . DJD (degenerative joint disease)   . Migraines   . Iron deficiency anemia   . Esophagitis 1991    grade 1  . Diverticulosis   . Hemorrhoids   . GERD (gastroesophageal reflux disease)     Hiatal hernia  . Allergic rhinitis     chronic sinusitis  . Cataract     has had lasik surg  . History of stress test 07/2012    normal prefusion and function with out scar or ischemia  . Sleep apnea     Questionalble: RDI during the total sleep time 6h 28 mins was 3.55/hr during REM sleep was at 5.71/hr. Supine AHI was 5.93/hr.   Current Outpatient Prescriptions on File Prior to Visit  Medication Sig Dispense Refill  . amLODipine (NORVASC) 2.5 MG tablet Take 1 tablet (2.5 mg total) by mouth daily.  30 tablet  6  . aspirin 81 MG tablet Take 81 mg by mouth daily.      . cholecalciferol (VITAMIN D) 1000 UNITS tablet Take 1,000 Units by mouth daily.      . citalopram (CELEXA) 40 MG tablet TAKE 1 TABLET BY MOUTH DAILY  30 tablet  0  . clopidogrel (PLAVIX) 75 MG tablet Take 75 mg by mouth daily with breakfast.      . fluticasone (FLONASE) 50  MCG/ACT nasal spray Place 2 sprays into the nose at bedtime.  16 g  11  . furosemide (LASIX) 40 MG tablet Take 40 mg by mouth daily.      . nebivolol (BYSTOLIC) 10 MG tablet Take 10 mg by mouth daily.      . nitroGLYCERIN (NITROSTAT) 0.4 MG SL tablet Place 1 tablet (0.4 mg total) under the tongue every 5 (five) minutes as needed. Chest pain  25 tablet  3  . oxyCODONE-acetaminophen (PERCOCET) 10-325 MG per tablet Take 1 tablet by mouth every 6 (six) hours as needed for pain.  30 tablet  0  . pantoprazole (PROTONIX) 40 MG tablet Take 40 mg by mouth daily.      . potassium chloride (MICRO-K) 10 MEQ CR capsule Take 1 capsule (10 mEq total) by mouth daily.  30 capsule  11  . rosuvastatin (CRESTOR) 20 MG tablet Take 20 mg by mouth daily.      Marland Kitchen telmisartan (MICARDIS) 40 MG tablet Take 40 mg by mouth daily.      . triazolam (HALCION) 0.25 MG tablet Take 0.25 mg by mouth at bedtime as needed for sleep.      Marland Kitchen UNABLE TO FIND CPAP therapy  No current facility-administered medications on file prior to visit.   Allergies  Allergen Reactions  . Other Shortness Of Breath    BETA BLOCKERS  . Ibuprofen Swelling  . Toprol Xl [Metoprolol Succinate] Other (See Comments)    Personality change   History   Social History  . Marital Status: Married    Spouse Name: N/A    Number of Children: 2  . Years of Education: N/A   Occupational History  . retired    Social History Main Topics  . Smoking status: Former Smoker    Quit date: 06/01/1987  . Smokeless tobacco: Never Used  . Alcohol Use: Yes     Comment: once every 6-7 months  . Drug Use: No  . Sexual Activity: Not on file   Other Topics Concern  . Not on file   Social History Narrative  . No narrative on file      Review of Systems  All other systems reviewed and are negative.      Objective:   Physical Exam  Vitals reviewed. Constitutional: He is oriented to person, place, and time. He appears well-developed and  well-nourished.  HENT:  Head: Normocephalic and atraumatic.  Right Ear: External ear normal.  Left Ear: External ear normal.  Nose: Nose normal.  Mouth/Throat: Oropharynx is clear and moist. No oropharyngeal exudate.  Cardiovascular: Normal rate, regular rhythm and normal heart sounds.   Pulmonary/Chest: Effort normal and breath sounds normal. No respiratory distress. He has no wheezes. He has no rales.  Neurological: He is alert and oriented to person, place, and time. He has normal reflexes. He displays normal reflexes. No cranial nerve deficit. He exhibits normal muscle tone. Coordination normal.   Has a positive Dix-Hallpike maneuver to the right and the left.       Assessment & Plan:  1. BPPV (benign paroxysmal positional vertigo), bilateral Recheck in one week if no better or sooner if worse. I spent 15 minutes discussing the natural history. I anticipate gradual spontaneous improvement over the next one to 2 weeks. - meclizine (ANTIVERT) 25 MG tablet; Take 1 tablet (25 mg total) by mouth 3 (three) times daily as needed for dizziness.  Dispense: 30 tablet; Refill: 0

## 2013-11-23 ENCOUNTER — Other Ambulatory Visit: Payer: Self-pay | Admitting: Cardiovascular Disease

## 2013-11-23 ENCOUNTER — Other Ambulatory Visit: Payer: Self-pay | Admitting: Family Medicine

## 2013-11-23 NOTE — Telephone Encounter (Signed)
Rx refill sent to patient pharmacy   

## 2013-11-23 NOTE — Telephone Encounter (Signed)
Medication refilled per protocol. 

## 2013-12-06 ENCOUNTER — Telehealth: Payer: Self-pay | Admitting: Cardiovascular Disease

## 2013-12-06 ENCOUNTER — Telehealth: Payer: Self-pay | Admitting: Family Medicine

## 2013-12-06 MED ORDER — CITALOPRAM HYDROBROMIDE 40 MG PO TABS: ORAL_TABLET | ORAL | Status: DC

## 2013-12-06 NOTE — Telephone Encounter (Signed)
Left VM with patient and informed him that the Rx was denied and the pharmacy was notified to send it to patient PCP. Dr.Kelly does not refill Celexa

## 2013-12-06 NOTE — Telephone Encounter (Signed)
Patient states that his pharmacy faxed a refill request 1 month ago for his Citalopram 40 mg and has not heard anything.

## 2013-12-06 NOTE — Telephone Encounter (Signed)
Needs refill on  citalopram if possible 40 mg  [phone] Pharmacy is [facility] [address]

## 2013-12-06 NOTE — Telephone Encounter (Signed)
Med sent to pharm 

## 2013-12-20 ENCOUNTER — Telehealth: Payer: Self-pay | Admitting: Family Medicine

## 2013-12-20 NOTE — Telephone Encounter (Signed)
Patient is calling to get rx for his oxycodone and his sleeping pill pharmacy is [facility] [address]  Phone number to call him back is [phone]

## 2013-12-20 NOTE — Telephone Encounter (Signed)
?   OK to Refill  

## 2013-12-21 MED ORDER — TRIAZOLAM 0.25 MG PO TABS: 0.2500 mg | ORAL_TABLET | Freq: Every evening | ORAL | Status: DC | PRN

## 2013-12-21 MED ORDER — OXYCODONE-ACETAMINOPHEN 10-325 MG PO TABS: 1.0000 | ORAL_TABLET | Freq: Four times a day (QID) | ORAL | Status: DC | PRN

## 2013-12-21 NOTE — Telephone Encounter (Signed)
RX printed, left up front and patient aware to pick up  

## 2013-12-21 NOTE — Telephone Encounter (Signed)
ok 

## 2013-12-27 ENCOUNTER — Other Ambulatory Visit: Payer: Self-pay | Admitting: Cardiovascular Disease

## 2013-12-27 NOTE — Telephone Encounter (Signed)
Rx was sent to pharmacy electronically. 

## 2014-01-15 ENCOUNTER — Telehealth: Payer: Self-pay | Admitting: Cardiovascular Disease

## 2014-01-15 NOTE — Telephone Encounter (Signed)
Pt called in wanting to be seen sooner than his scheduled appt on 9/10 because he stated that he is having some irregular heart beats, pressure in his chest, and some bad headaches. Please call  Thanks

## 2014-01-16 NOTE — Telephone Encounter (Signed)
Please schedule patient with extender if he is requesting a sooner appointment. Dr. Claiborne Billings does not currently have any sooner appointments available.

## 2014-01-17 ENCOUNTER — Encounter: Payer: Self-pay | Admitting: Family Medicine

## 2014-01-17 ENCOUNTER — Ambulatory Visit (INDEPENDENT_AMBULATORY_CARE_PROVIDER_SITE_OTHER): Payer: 59 | Admitting: Family Medicine

## 2014-01-17 VITALS — BP 104/62 | HR 68 | Temp 98.0°F | Resp 18 | Ht 68.0 in | Wt 246.0 lb

## 2014-01-17 DIAGNOSIS — R002 Palpitations: Secondary | ICD-10-CM

## 2014-01-17 NOTE — Progress Notes (Signed)
Subjective:    Patient ID: Lee [name], male    DOB: 11-21-1950, 63 y.o.   MRN: [id number]  HPI Over the last 3 weeks, the patient complains of skipped heartbeats and palpitations.  He describes it as a pause he will feel suddenly in his heart. It seems to last a brief moment and then his regular heart rate will return. Occasionally it can happen recurrently over a period of 30 minutes. It happens at least once every day. He denies any syncope or presyncope. He denies any shortness of breath or chest pain. He does have a history of supraventricular tachycardia along with atrial fibrillation. He underwent a cardiac ablation has been in normal sinus rhythm ever since. Past Medical History  Diagnosis Date  . Arthritis   . Hyperlipidemia   . Hypertension   . COPD (chronic obstructive pulmonary disease)   . PVC's (premature ventricular contractions)   . Paroxysmal atrial fibrillation     s/p afib ablation 10/22/08  . CAD (coronary artery disease)     PCI RCA and LAD 1/12  . DJD (degenerative joint disease)   . Migraines   . Iron deficiency anemia   . Esophagitis 1991    grade 1  . Diverticulosis   . Hemorrhoids   . GERD (gastroesophageal reflux disease)     Hiatal hernia  . Allergic rhinitis     chronic sinusitis  . Cataract     has had lasik surg  . History of stress test 07/2012    normal prefusion and function with out scar or ischemia  . Sleep apnea     Questionalble: RDI during the total sleep time 6h 28 mins was 3.55/hr during REM sleep was at 5.71/hr. Supine AHI was 5.93/hr.   Past Surgical History  Procedure Laterality Date  . Lasik    . Nasal sinus surgery  2001  . Coronary angioplasty with stent placement      3 stents/ 4 caths  . Knee arthroscopy      right  . Neck surgery      Cervical fusion  . Shoulder open rotator cuff repair      right  . Rotator cuff repair      left  . Wrist arthrocentesis      left  . Spine surgery    . Radiofrequency ablation   May 2010    atrial fibrillation  . Eye surgery    . Cardiac catheterization  05/2010    The proximal LAD was then predilated with 2.0 x 12 trek. this was then stented with a 2.5 x 16 promus Element drug-eluting stent at 14 atmosphere and prostdilated with 2.75 x 12 [place] trek at 16 atmospheres(2.8 mm) resulting the reduction of 80% proximal LAD stenosis to 0% residual with excellent flow.  . Cardiac catheterization  06/05/2010    Successful percutaneous coronary intervention to the right coronary artery with percutaneous transluminal coronary angioplasty/stenting and insertion 3.0 x 16 mm Promus DES post dilated to 3.35 mm with stenosis being reduced to 0%  . Cardiac catheterization  02/2008    Post dilatation was performed using a 2.75 x 9 [place] sprinter, 10 atmospheres for 40 seconds and then 9 atmosphere for 35 sec. This resulted in the 80% area of narrowing pre-intervention, now appearing to normal. There was no edvidence of the dissection or thrombus and there was TIMI III flow pre and post.  . Cardiac catheterization  02/2006   Current Outpatient Prescriptions on File Prior to  Visit  Medication Sig Dispense Refill  . amLODipine (NORVASC) 2.5 MG tablet Take 1 tablet (2.5 mg total) by mouth daily.  30 tablet  6  . aspirin 81 MG tablet Take 81 mg by mouth daily.      Marland Kitchen BYSTOLIC 10 MG tablet TAKE 1 TABLET BY MOUTH DAILY  30 tablet  6  . cholecalciferol (VITAMIN D) 1000 UNITS tablet Take 1,000 Units by mouth daily.      . citalopram (CELEXA) 40 MG tablet TAKE 1 TABLET BY MOUTH DAILY  30 tablet  5  . clopidogrel (PLAVIX) 75 MG tablet Take 75 mg by mouth daily with breakfast.      . clopidogrel (PLAVIX) 75 MG tablet TAKE 1 TABLET BY MOUTH DAILY  30 tablet  6  . CRESTOR 20 MG tablet TAKE 1 TABLET BY MOUTH EVERY DAY.  30 tablet  6  . fluticasone (FLONASE) 50 MCG/ACT nasal spray USE 2 SPRAYS IN NOSE AT BEDTIME  16 g  6  . furosemide (LASIX) 40 MG tablet Take 40 mg by mouth daily.      . meclizine  (ANTIVERT) 25 MG tablet Take 1 tablet (25 mg total) by mouth 3 (three) times daily as needed for dizziness.  30 tablet  0  . nebivolol (BYSTOLIC) 10 MG tablet Take 10 mg by mouth daily.      . nitroGLYCERIN (NITROSTAT) 0.4 MG SL tablet Place 1 tablet (0.4 mg total) under the tongue every 5 (five) minutes as needed. Chest pain  25 tablet  3  . oxyCODONE-acetaminophen (PERCOCET) 10-325 MG per tablet Take 1 tablet by mouth every 6 (six) hours as needed for pain.  30 tablet  0  . pantoprazole (PROTONIX) 40 MG tablet Take 40 mg by mouth daily.      . pantoprazole (PROTONIX) 40 MG tablet TAKE 1 TABLET BY MOUTH EVERY DAY  30 tablet  4  . potassium chloride (MICRO-K) 10 MEQ CR capsule Take 1 capsule (10 mEq total) by mouth daily.  30 capsule  11  . rosuvastatin (CRESTOR) 20 MG tablet Take 20 mg by mouth daily.      Marland Kitchen telmisartan (MICARDIS) 40 MG tablet Take 40 mg by mouth daily.      . triazolam (HALCION) 0.25 MG tablet Take 1 tablet (0.25 mg total) by mouth at bedtime as needed for sleep.  30 tablet  2  . UNABLE TO FIND CPAP therapy       No current facility-administered medications on file prior to visit.   Allergies  Allergen Reactions  . Other Shortness Of Breath    BETA BLOCKERS  . Ibuprofen Swelling  . Toprol Xl [Metoprolol Succinate] Other (See Comments)    Personality change   History   Social History  . Marital Status: Married    Spouse Name: N/A    Number of Children: 2  . Years of Education: N/A   Occupational History  . retired    Social History Main Topics  . Smoking status: Former Smoker    Quit date: 06/01/1987  . Smokeless tobacco: Never Used  . Alcohol Use: Yes     Comment: once every 6-7 months  . Drug Use: No  . Sexual Activity: Not on file   Other Topics Concern  . Not on file   Social History Narrative  . No narrative on file      Review of Systems  All other systems reviewed and are negative.      Objective:  Physical Exam  Vitals  reviewed. Cardiovascular: Normal rate and regular rhythm.   Murmur heard. Pulmonary/Chest: Effort normal and breath sounds normal. No respiratory distress. He has no wheezes. He has no rales.  Abdominal: Soft. Bowel sounds are normal. He exhibits no distension. There is no tenderness. There is no rebound and no guarding.          Assessment & Plan:  Palpitations  At this point, the differential diagnosis includes PVCs/PACs, AV block, or some type of cardiac arrhythmia. At the present time he is in normal sinus rhythm. Therefore I will give the patient a 24-hour Holter monitor. We'll monitor his heart rate continuously for the next 24 hours and see if we can capture one of these events. Depending upon what is found, we may need to switch his bystolic to coreg or consult his cardiologist.  At the present time, I believe he is most likely experiencing PVCs and PACs. I explained that to the patient. Recommended he decrease his consumption of caffeine. I recommended he try to decrease his stress level. I recommended he try to get 8 hours of sleep every night as of these lifestyle factors can contribute to PVCs and PACs

## 2014-01-18 ENCOUNTER — Other Ambulatory Visit: Payer: Self-pay | Admitting: Cardiovascular Disease

## 2014-01-18 NOTE — Telephone Encounter (Signed)
Rx was sent to pharmacy electronically. 

## 2014-01-21 ENCOUNTER — Ambulatory Visit: Payer: 59 | Admitting: Cardiology

## 2014-01-21 ENCOUNTER — Other Ambulatory Visit: Payer: Self-pay | Admitting: Cardiovascular Disease

## 2014-01-22 NOTE — Telephone Encounter (Signed)
Rx was sent to pharmacy electronically. 

## 2014-01-25 ENCOUNTER — Telehealth: Payer: Self-pay | Admitting: Cardiovascular Disease

## 2014-01-25 ENCOUNTER — Ambulatory Visit (INDEPENDENT_AMBULATORY_CARE_PROVIDER_SITE_OTHER): Payer: 59 | Admitting: Family Medicine

## 2014-01-25 ENCOUNTER — Encounter: Payer: Self-pay | Admitting: Cardiovascular Disease

## 2014-01-25 ENCOUNTER — Encounter: Payer: Self-pay | Admitting: Family Medicine

## 2014-01-25 VITALS — BP 132/80 | HR 78 | Temp 98.0°F | Resp 16 | Ht 67.32 in | Wt 251.0 lb

## 2014-01-25 DIAGNOSIS — I4949 Other premature depolarization: Secondary | ICD-10-CM

## 2014-01-25 DIAGNOSIS — I493 Ventricular premature depolarization: Secondary | ICD-10-CM

## 2014-01-25 MED ORDER — CARVEDILOL 6.25 MG PO TABS: 6.2500 mg | ORAL_TABLET | Freq: Two times a day (BID) | ORAL | Status: DC

## 2014-01-25 NOTE — Progress Notes (Signed)
Subjective:    Patient ID: Lee [name], male    DOB: December 20, 1950, 63 y.o.   MRN: [id number]  HPI 01/17/14 Over the last 3 weeks, the patient complains of skipped heartbeats and palpitations.  He describes it as a pause he will feel suddenly in his heart. It seems to last a brief moment and then his regular heart rate will return. Occasionally it can happen recurrently over a period of 30 minutes. It happens at least once every day. He denies any syncope or presyncope. He denies any shortness of breath or chest pain. He does have a history of supraventricular tachycardia along with atrial fibrillation. He underwent a cardiac ablation has been in normal sinus rhythm ever since.  At that time, my plan was:   At this point, the differential diagnosis includes PVCs/PACs, AV block, or some type of cardiac arrhythmia. At the present time he is in normal sinus rhythm. Therefore I will give the patient a 24-hour Holter monitor. We'll monitor his heart rate continuously for the next 24 hours and see if we can capture one of these events. Depending upon what is found, we may need to switch his bystolic to coreg or consult his cardiologist.  At the present time, I believe he is most likely experiencing PVCs and PACs. I explained that to the patient. Recommended he decrease his consumption of caffeine. I recommended he try to decrease his stress level. I recommended he try to get 8 hours of sleep every night as of these lifestyle factors can contribute to PVCs and PACs  01/25/14 Patient is here today for followup. I have the results of his Holter monitor. Patient had 9 PACs. He also had 1100 PVCs out of 90,000 beats.  He averages 46 PVC's an hour.  Patient also had a 4 beat run of SVT.  He is here today to discuss.  He does have a past medical history of ablation for atrial fibrillation as well as supraventricular tachycardia. He is recently experiencing more ectopic beats. He also is having some occasional  atypical chest pain.  This is unrelated to exertion. Patient had catheterization performed in January results included below:  Left Ventriculography:  EF: 65 %  Wall Motion: Normal Coronary Anatomy:  Left Main: Large-caliber vessel that bifurcates into the LAD, and Circumflex. Angiographically normal. LAD: Large-caliber vessel with patent stent proximally with a roughly 20% irregular in-stent re-stenosis. The mid stent also had tubular 40% in-stent restenosis. The remainder the vessel is relatively free of disease it wraps the apex.  D1: Small-caliber vessel, jailed by the stent. No significant lesions. Left Circumflex: Large-caliber vessel, angiographically normal. It actually courses as a large lateral OM. They're several small branches of the small AV groove branch  RCA: Large caliber, dominant vessel with a roughly 40% narrowing just proximal to the widely patent stent in the mid vessel the it then courses down around 2 terminates as The Right Posterior Descending Artery prior to giving off the Right   Past Medical History  Diagnosis Date  . Arthritis   . Hyperlipidemia   . Hypertension   . COPD (chronic obstructive pulmonary disease)   . PVC's (premature ventricular contractions)   . Paroxysmal atrial fibrillation     s/p afib ablation 10/22/08  . CAD (coronary artery disease)     PCI RCA and LAD 1/12  . DJD (degenerative joint disease)   . Migraines   . Iron deficiency anemia   . Esophagitis 1991    grade  1  . Diverticulosis   . Hemorrhoids   . GERD (gastroesophageal reflux disease)     Hiatal hernia  . Allergic rhinitis     chronic sinusitis  . Cataract     has had lasik surg  . History of stress test 07/2012    normal prefusion and function with out scar or ischemia  . Sleep apnea     Questionalble: RDI during the total sleep time 6h 28 mins was 3.55/hr during REM sleep was at 5.71/hr. Supine AHI was 5.93/hr.   Past Surgical History  Procedure Laterality Date  .  Lasik    . Nasal sinus surgery  2001  . Coronary angioplasty with stent placement      3 stents/ 4 caths  . Knee arthroscopy      right  . Neck surgery      Cervical fusion  . Shoulder open rotator cuff repair      right  . Rotator cuff repair      left  . Wrist arthrocentesis      left  . Spine surgery    . Radiofrequency ablation  May 2010    atrial fibrillation  . Eye surgery    . Cardiac catheterization  05/2010    The proximal LAD was then predilated with 2.0 x 12 trek. this was then stented with a 2.5 x 16 promus Element drug-eluting stent at 14 atmosphere and prostdilated with 2.75 x 12 [place] trek at 16 atmospheres(2.8 mm) resulting the reduction of 80% proximal LAD stenosis to 0% residual with excellent flow.  . Cardiac catheterization  06/05/2010    Successful percutaneous coronary intervention to the right coronary artery with percutaneous transluminal coronary angioplasty/stenting and insertion 3.0 x 16 mm Promus DES post dilated to 3.35 mm with stenosis being reduced to 0%  . Cardiac catheterization  02/2008    Post dilatation was performed using a 2.75 x 9  sprinter, 10 atmospheres for 40 seconds and then 9 atmosphere for 35 sec. This resulted in the 80% area of narrowing pre-intervention, now appearing to normal. There was no edvidence of the dissection or thrombus and there was TIMI III flow pre and post.  . Cardiac catheterization  02/2006   Current Outpatient Prescriptions on File Prior to Visit  Medication Sig Dispense Refill  . amLODipine (NORVASC) 2.5 MG tablet TAKE 1 TABLET BY MOUTH EVERY DAY  30 tablet  6  . aspirin 81 MG tablet Take 81 mg by mouth daily.      Marland Kitchen BYSTOLIC 10 MG tablet TAKE 1 TABLET BY MOUTH DAILY  30 tablet  6  . cholecalciferol (VITAMIN D) 1000 UNITS tablet Take 1,000 Units by mouth daily.      . citalopram (CELEXA) 40 MG tablet TAKE 1 TABLET BY MOUTH DAILY  30 tablet  5  . clopidogrel (PLAVIX) 75 MG tablet TAKE 1 TABLET BY MOUTH DAILY  30 tablet   6  . CRESTOR 20 MG tablet TAKE 1 TABLET BY MOUTH EVERY DAY.  30 tablet  6  . fluticasone (FLONASE) 50 MCG/ACT nasal spray USE 2 SPRAYS IN NOSE AT BEDTIME  16 g  6  . furosemide (LASIX) 40 MG tablet Take 40 mg by mouth daily.      . nitroGLYCERIN (NITROSTAT) 0.4 MG SL tablet Place 1 tablet (0.4 mg total) under the tongue every 5 (five) minutes as needed. Chest pain  25 tablet  3  . oxyCODONE-acetaminophen (PERCOCET) 10-325 MG per tablet Take 1 tablet by  mouth every 6 (six) hours as needed for pain.  30 tablet  0  . pantoprazole (PROTONIX) 40 MG tablet TAKE 1 TABLET BY MOUTH EVERY DAY  30 tablet  4  . potassium chloride (MICRO-K) 10 MEQ CR capsule Take 1 capsule (10 mEq total) by mouth daily.  30 capsule  11  . telmisartan (MICARDIS) 40 MG tablet TAKE 1 TABLET BY MOUTH EVERY DAY  30 tablet  6  . triazolam (HALCION) 0.25 MG tablet Take 1 tablet (0.25 mg total) by mouth at bedtime as needed for sleep.  30 tablet  2  . UNABLE TO FIND CPAP therapy      . meclizine (ANTIVERT) 25 MG tablet Take 1 tablet (25 mg total) by mouth 3 (three) times daily as needed for dizziness.  30 tablet  0   No current facility-administered medications on file prior to visit.   Allergies  Allergen Reactions  . Other Shortness Of Breath    BETA BLOCKERS  . Ibuprofen Swelling  . Toprol Xl [Metoprolol Succinate] Other (See Comments)    Personality change   History   Social History  . Marital Status: Married    Spouse Name: N/A    Number of Children: 2  . Years of Education: N/A   Occupational History  . retired    Social History Main Topics  . Smoking status: Former Smoker    Quit date: 06/01/1987  . Smokeless tobacco: Never Used  . Alcohol Use: Yes     Comment: once every 6-7 months  . Drug Use: No  . Sexual Activity: Not on file   Other Topics Concern  . Not on file   Social History Narrative  . No narrative on file      Review of Systems  All other systems reviewed and are negative.        Objective:   Physical Exam  Vitals reviewed. Cardiovascular: Normal rate and regular rhythm.   Murmur heard. Pulmonary/Chest: Effort normal and breath sounds normal. No respiratory distress. He has no wheezes. He has no rales.  Abdominal: Soft. Bowel sounds are normal. He exhibits no distension. There is no tenderness. There is no rebound and no guarding.          Assessment & Plan:  PVC (premature ventricular contraction) - Plan: Ambulatory referral to Cardiology  I will have the patient discontinue bystolic and replace it with carvedilol 6.125 mg by mouth twice a day which not only selects B1 receptors but alpha receptors.  Hopefully this will decrease the frequency of PVCs. Due to the frequency and number of the PVCs I will have the patient see his cardiologist Dr. Rayann Heman given his significant past medical history of arrhythmias. I'll arrange that consultation as soon as possible.

## 2014-01-28 NOTE — Telephone Encounter (Signed)
Closed encounter °

## 2014-02-07 ENCOUNTER — Ambulatory Visit: Payer: 59 | Admitting: Cardiovascular Disease

## 2014-02-19 ENCOUNTER — Encounter: Payer: Self-pay | Admitting: Podiatry

## 2014-02-19 ENCOUNTER — Ambulatory Visit (INDEPENDENT_AMBULATORY_CARE_PROVIDER_SITE_OTHER): Payer: 59 | Admitting: Podiatry

## 2014-02-19 DIAGNOSIS — Q828 Other specified congenital malformations of skin: Secondary | ICD-10-CM

## 2014-02-20 NOTE — Progress Notes (Signed)
He presents today for followup of his porokeratosis sub-fifth metatarsophalangeal joint bilateral.  Objective: Pulses are palpable bilateral. Porokeratosis appear to be doing much better than previously noted.  Assessment: Porokeratosis sub-fifth bilateral.  Plan: Debridement of reactive hyperkeratosis today.

## 2014-03-04 ENCOUNTER — Encounter: Payer: Self-pay | Admitting: Internal Medicine

## 2014-03-04 ENCOUNTER — Ambulatory Visit (HOSPITAL_COMMUNITY): Payer: 59 | Attending: Internal Medicine

## 2014-03-04 ENCOUNTER — Encounter: Payer: Self-pay | Admitting: *Deleted

## 2014-03-04 ENCOUNTER — Ambulatory Visit (INDEPENDENT_AMBULATORY_CARE_PROVIDER_SITE_OTHER): Payer: 59 | Admitting: Internal Medicine

## 2014-03-04 VITALS — BP 136/88 | HR 80 | Ht 68.0 in | Wt 255.8 lb

## 2014-03-04 DIAGNOSIS — R002 Palpitations: Secondary | ICD-10-CM

## 2014-03-04 DIAGNOSIS — I4891 Unspecified atrial fibrillation: Secondary | ICD-10-CM | POA: Insufficient documentation

## 2014-03-04 DIAGNOSIS — G4733 Obstructive sleep apnea (adult) (pediatric): Secondary | ICD-10-CM

## 2014-03-04 DIAGNOSIS — Z87891 Personal history of nicotine dependence: Secondary | ICD-10-CM | POA: Insufficient documentation

## 2014-03-04 DIAGNOSIS — I10 Essential (primary) hypertension: Secondary | ICD-10-CM | POA: Insufficient documentation

## 2014-03-04 DIAGNOSIS — E785 Hyperlipidemia, unspecified: Secondary | ICD-10-CM | POA: Insufficient documentation

## 2014-03-04 DIAGNOSIS — I480 Paroxysmal atrial fibrillation: Secondary | ICD-10-CM

## 2014-03-04 DIAGNOSIS — I493 Ventricular premature depolarization: Secondary | ICD-10-CM

## 2014-03-04 MED ORDER — CARVEDILOL 6.25 MG PO TABS: 6.2500 mg | ORAL_TABLET | Freq: Two times a day (BID) | ORAL | Status: DC

## 2014-03-04 NOTE — Patient Instructions (Addendum)
   Your physician recommends that you schedule a follow-up appointment as scheduled with [name] and as needed with Dr Rayann Heman  Your physician has recommended you make the following change in your medication:  1) Increase Carvedilol 6.25mg  twice daily   Your physician has requested that you have an echocardiogram. Echocardiography is a painless test that uses sound waves to create images of your heart. It provides your doctor with information about the size and shape of your heart and how well your heart's chambers and valves are working. This procedure takes approximately one hour. There are no restrictions for this procedure.

## 2014-03-04 NOTE — Progress Notes (Signed)
Primary Care Physician: Odette Fraction, MD Referring Physician: Same Primary cardiologist: Dr. Staci Righter Lee [name] is a 63 y.o. male with a h/o  CAD, HTN, Obesity, OSA, s/p ablation of afib in 2011, here today for evaluation of PVC's. Recently wore a monitor x 24 hours which shows PVC's, PAC's, but with low burden only occurring 1.2% of total beats. No runs of ventricular ectopy noted..Recently switched from bystolic to coreg 7.79 mg qd without much improvement. Notices more so at rest. Has some on going issues with atypical chest pain, lightheadedness and shortness of breath for which he had a catherization in January which showed disease to be stable/patent stents.Wearing cpap consistently.Pt has not noticed any issues with afib since ablation.  Today, he denies, PND, lower extremity edema, dizziness, presyncope, syncope, or neurologic sequela. Positive for intermittent chest pain which sounds MS in origin, some lightheadedness and sob. The patient is tolerating medications without difficulties and is otherwise without complaint today.   Past Medical History  Diagnosis Date  . Arthritis   . Hyperlipidemia   . Hypertension   . COPD (chronic obstructive pulmonary disease)   . PVC's (premature ventricular contractions)   . Paroxysmal atrial fibrillation     s/p afib ablation 10/22/08  . CAD (coronary artery disease)     PCI RCA and LAD 1/12  . DJD (degenerative joint disease)   . Migraines   . Iron deficiency anemia   . Esophagitis 1991    grade 1  . Diverticulosis   . Hemorrhoids   . GERD (gastroesophageal reflux disease)     Hiatal hernia  . Allergic rhinitis     chronic sinusitis  . Cataract     has had lasik surg  . History of stress test 07/2012    normal prefusion and function with out scar or ischemia  . Sleep apnea     Questionalble: RDI during the total sleep time 6h 28 mins was 3.55/hr during REM sleep was at 5.71/hr. Supine AHI was 5.93/hr.   Past Surgical  History  Procedure Laterality Date  . Lasik    . Nasal sinus surgery  2001  . Coronary angioplasty with stent placement      3 stents/ 4 caths  . Knee arthroscopy      right  . Neck surgery      Cervical fusion  . Shoulder open rotator cuff repair      right  . Rotator cuff repair      left  . Wrist arthrocentesis      left  . Spine surgery    . Radiofrequency ablation  May 2010    atrial fibrillation  . Eye surgery    . Cardiac catheterization  05/2010    The proximal LAD was then predilated with 2.0 x 12 trek. this was then stented with a 2.5 x 16 promus Element drug-eluting stent at 14 atmosphere and prostdilated with 2.75 x 12 [place] trek at 16 atmospheres(2.8 mm) resulting the reduction of 80% proximal LAD stenosis to 0% residual with excellent flow.  . Cardiac catheterization  06/05/2010    Successful percutaneous coronary intervention to the right coronary artery with percutaneous transluminal coronary angioplasty/stenting and insertion 3.0 x 16 mm Promus DES post dilated to 3.35 mm with stenosis being reduced to 0%  . Cardiac catheterization  02/2008    Post dilatation was performed using a 2.75 x 9 [place] sprinter, 10 atmospheres for 40 seconds and then 9 atmosphere for 35 sec. This  resulted in the 80% area of narrowing pre-intervention, now appearing to normal. There was no edvidence of the dissection or thrombus and there was TIMI III flow pre and post.  . Cardiac catheterization  02/2006    Current Outpatient Prescriptions  Medication Sig Dispense Refill  . amLODipine (NORVASC) 2.5 MG tablet TAKE 1 TABLET BY MOUTH EVERY DAY  30 tablet  6  . aspirin 81 MG tablet Take 81 mg by mouth daily.      . carvedilol (COREG) 6.25 MG tablet Take 1 tablet (6.25 mg total) by mouth 2 (two) times daily with a meal.  180 tablet  3  . cholecalciferol (VITAMIN D) 1000 UNITS tablet Take 1,000 Units by mouth daily.      . citalopram (CELEXA) 40 MG tablet TAKE 1 TABLET BY MOUTH DAILY  30 tablet  5    . clopidogrel (PLAVIX) 75 MG tablet TAKE 1 TABLET BY MOUTH DAILY  30 tablet  6  . Coenzyme Q10 (CO Q 10) 100 MG CAPS Take 1 capsule by mouth daily.      . CRESTOR 20 MG tablet TAKE 1 TABLET BY MOUTH EVERY DAY.  30 tablet  6  . fluticasone (FLONASE) 50 MCG/ACT nasal spray USE 2 SPRAYS IN NOSE AT BEDTIME  16 g  6  . furosemide (LASIX) 40 MG tablet Take 40 mg by mouth daily.      . nitroGLYCERIN (NITROSTAT) 0.4 MG SL tablet Place 0.4 mg under the tongue every 5 (five) minutes as needed (MAX 3 TABLETS). Chest pain      . oxyCODONE-acetaminophen (PERCOCET) 10-325 MG per tablet Take 1 tablet by mouth every 6 (six) hours as needed for pain.  30 tablet  0  . pantoprazole (PROTONIX) 40 MG tablet TAKE 1 TABLET BY MOUTH EVERY DAY  30 tablet  4  . potassium chloride (MICRO-K) 10 MEQ CR capsule Take 1 capsule (10 mEq total) by mouth daily.  30 capsule  11  . telmisartan (MICARDIS) 40 MG tablet TAKE 1 TABLET BY MOUTH EVERY DAY  30 tablet  6  . triazolam (HALCION) 0.25 MG tablet Take 1 tablet (0.25 mg total) by mouth at bedtime as needed for sleep.  30 tablet  2  . UNABLE TO FIND CPAP therapy       No current facility-administered medications for this visit.    Allergies  Allergen Reactions  . Ibuprofen Swelling  . Other Shortness Of Breath    BETA BLOCKERS  . Toprol Xl [Metoprolol Succinate] Other (See Comments)    Personality change    History   Social History  . Marital Status: Married    Spouse Name: N/A    Number of Children: 2  . Years of Education: N/A   Occupational History  . retired    Social History Main Topics  . Smoking status: Former Smoker    Quit date: 06/01/1987  . Smokeless tobacco: Never Used  . Alcohol Use: Yes     Comment: once every 6-7 months  . Drug Use: No  . Sexual Activity: Not on file   Other Topics Concern  . Not on file   Social History Narrative  . No narrative on file    Family History  Problem Relation Age of Onset  . Heart disease Father      and sister  . Diabetes Father   . Colon cancer Mother     mets from uterine  . Uterine cancer Mother   . Heart disease Sister  ROS- All systems are reviewed and negative except as per the HPI above  Physical Exam: Filed Vitals:   03/04/14 0957  BP: 136/88  Pulse: 80  Height: 5\' 8"  (1.727 m)  Weight: 255 lb 12.8 oz (116.03 kg)    GEN- The patient is well appearing, alert and oriented x 3 today.   Head- normocephalic, atraumatic Eyes-  Sclera clear, conjunctiva pink Ears- hearing intact Oropharynx- clear Neck- supple, no JVP Lymph- no cervical lymphadenopathy Lungs- Clear to ausculation bilaterally, normal work of breathing Heart- Regular rate and rhythm, no murmurs, rubs or gallops, PMI not laterally displaced GI- soft, NT, ND, + BS Extremities- no clubbing, cyanosis, or edema MS- no significant deformity or atrophy Skin- no rash or lesion Psych- euthymic mood, full affect Neuro- strength and sensation are intact  EKG-SR with 1st degree AV block with RBBB. Holter reviewed as per HPI. Last echo 2010. Cardiac cath reviewed from 1/15  Assessment and Plan: 1. PVC's/PAC's PC's appear low burden and pt  reassured probably benign but a nusiance Increase coreg to bid Weight loss /exercise encouraged. Pt lost 50 lbs in the past and felt much better. Continue Cpap. Echo F/u with Dr. Claiborne Billings as already scheduled. See here as needed.  I have seen, examined the patient, and reviewed the above assessment and plan with Roderic Palau NP.  Changes to above are made where necessary.   The patient has done well without recurrence of afib for several years now post ablation.  He has occasional PACs and PVCs.  By recent holter, his PVC burden is relatively low and not amenable to ablation.  I will increase coreg to 6.25mg  BID today. He should lose weight and exercise regularly.  I will obtain an echo at this time. Follow-up with [name]. I will see as needed going forward.   Co  Sign: [name], MD 03/04/2014 10:00 PM

## 2014-03-04 NOTE — Progress Notes (Signed)
2D Echo completed. 03/04/2014

## 2014-03-27 ENCOUNTER — Other Ambulatory Visit: Payer: Self-pay | Admitting: Cardiovascular Disease

## 2014-03-27 ENCOUNTER — Ambulatory Visit (INDEPENDENT_AMBULATORY_CARE_PROVIDER_SITE_OTHER): Payer: 59 | Admitting: Cardiovascular Disease

## 2014-03-27 ENCOUNTER — Encounter: Payer: Self-pay | Admitting: Cardiovascular Disease

## 2014-03-27 VITALS — BP 138/82 | HR 77 | Ht 68.0 in | Wt 262.5 lb

## 2014-03-27 DIAGNOSIS — E785 Hyperlipidemia, unspecified: Secondary | ICD-10-CM

## 2014-03-27 DIAGNOSIS — I493 Ventricular premature depolarization: Secondary | ICD-10-CM

## 2014-03-27 DIAGNOSIS — I4510 Unspecified right bundle-branch block: Secondary | ICD-10-CM

## 2014-03-27 DIAGNOSIS — I10 Essential (primary) hypertension: Secondary | ICD-10-CM

## 2014-03-27 DIAGNOSIS — R002 Palpitations: Secondary | ICD-10-CM

## 2014-03-27 DIAGNOSIS — G4733 Obstructive sleep apnea (adult) (pediatric): Secondary | ICD-10-CM

## 2014-03-27 MED ORDER — CARVEDILOL 12.5 MG PO TABS: ORAL_TABLET | ORAL | Status: DC

## 2014-03-27 NOTE — Progress Notes (Signed)
Patient ID: Lee [name], male   DOB: [birth year], 62 y.o.   MRN: [id number]      HPI: Lee [name], is a 63 y.o. male who presents to the office today for a 6 month followup cardiology evaluation.  Mr. Vecchione has known coronary artery disease and in 2009 underwent stenting of his mid LAD. In 2012 he underwent staged intervention with stenting of his proximal RCA and at a new site in his proximal LAD. A nuclear perfusion study February 2014 showed normal perfusion and function. In January 2015, he was seen by [name] in the office with complaints of chest pressure. He underwent cardiac catheterization this chest pain which was done by Dr. [name] on 06/29/2013. Catheterization did not demonstrate significant restenosis of his LAD stents with mild 20% narrowing in the proximal stent and 40% mid stent. The circumflex was normal. His right artery had 40% narrowing just proximal to a widely patent stent in the mid vessel. Nitrate therapy was added to his medical regimen but due to significant nitrate mediated headaches this ultimately was discontinued.  Presently, he does note some mild nonexertional type chest pain which is described as sharp.  It can occur at any time and usually resolve spontaneously. He is walking daily.  He denies any significant shortness of breath.  However, he does admit to spirits and palpitations.  These have improved in the past with beta blocker therapy.  In 2007, he did have a sleep study which suggested upper airway resistance syndrome without overall sleep apnea with the exception of mild sleep apnea with REM sleep at that time CPAP was not recommended. In June 2014 11/06/2012 he was having symptoms suggestive of progressive sleep apnea. He ultimately underwent a sleep study. I do not have the specifics of this diagnostic polysomnogram but subsequently he did undergo a CPAP titration to apparently his AHI was 12 on his initial study and he dropped his oxygen  from 96% to 83%. He ultimately was titrated up to a CPAP pressure of 11 cm. He admits to 100% compliance with reference to his obstructive sleep apnea.  Additional problems also include obesity.  When I last saw him, he had gained 33 pounds since his prior evaluation.  Over the past 6 months he has lost approximately 8 pounds.  Does have a history of hypertension and has been on Micardis 40 mg in addition to furosemide 40 mg and carvedilol 6.25 twice a day in addition to amlodipine 2.5 mg daily.  He denies recent leg swelling.  He does have hyperlipidemia.  He has been tolerating Crestor.  He will be seeing his primary M.D. in approximately one month.  Laboratory will be checked at that time.  Past Medical History  Diagnosis Date  . Arthritis   . Hyperlipidemia   . Hypertension   . COPD (chronic obstructive pulmonary disease)   . PVC's (premature ventricular contractions)   . Paroxysmal atrial fibrillation     s/p afib ablation 10/22/08  . CAD (coronary artery disease)     PCI RCA and LAD 1/12  . DJD (degenerative joint disease)   . Migraines   . Iron deficiency anemia   . Esophagitis 1991    grade 1  . Diverticulosis   . Hemorrhoids   . GERD (gastroesophageal reflux disease)     Hiatal hernia  . Allergic rhinitis     chronic sinusitis  . Cataract     has had lasik surg  . History of stress  test 07/2012    normal prefusion and function with out scar or ischemia  . Sleep apnea     Questionalble: RDI during the total sleep time 6h 28 mins was 3.55/hr during REM sleep was at 5.71/hr. Supine AHI was 5.93/hr.    Past Surgical History  Procedure Laterality Date  . Lasik    . Nasal sinus surgery  2001  . Coronary angioplasty with stent placement      3 stents/ 4 caths  . Knee arthroscopy      right  . Neck surgery      Cervical fusion  . Shoulder open rotator cuff repair      right  . Rotator cuff repair      left  . Wrist arthrocentesis      left  . Spine surgery    .  Radiofrequency ablation  May 2010    atrial fibrillation  . Eye surgery    . Cardiac catheterization  05/2010    The proximal LAD was then predilated with 2.0 x 12 trek. this was then stented with a 2.5 x 16 promus Element drug-eluting stent at 14 atmosphere and prostdilated with 2.75 x 12 [place] trek at 16 atmospheres(2.8 mm) resulting the reduction of 80% proximal LAD stenosis to 0% residual with excellent flow.  . Cardiac catheterization  06/05/2010    Successful percutaneous coronary intervention to the right coronary artery with percutaneous transluminal coronary angioplasty/stenting and insertion 3.0 x 16 mm Promus DES post dilated to 3.35 mm with stenosis being reduced to 0%  . Cardiac catheterization  02/2008    Post dilatation was performed using a 2.75 x 9 [place] sprinter, 10 atmospheres for 40 seconds and then 9 atmosphere for 35 sec. This resulted in the 80% area of narrowing pre-intervention, now appearing to normal. There was no edvidence of the dissection or thrombus and there was TIMI III flow pre and post.  . Cardiac catheterization  02/2006    Allergies  Allergen Reactions  . Ibuprofen Swelling  . Other Shortness Of Breath    BETA BLOCKERS  . Toprol Xl [Metoprolol Succinate] Other (See Comments)    Personality change    Current Outpatient Prescriptions  Medication Sig Dispense Refill  . amLODipine (NORVASC) 2.5 MG tablet TAKE 1 TABLET BY MOUTH EVERY DAY  30 tablet  6  . aspirin 81 MG tablet Take 81 mg by mouth daily.      . carvedilol (COREG) 6.25 MG tablet Take 1 tablet (6.25 mg total) by mouth 2 (two) times daily with a meal.  180 tablet  3  . cholecalciferol (VITAMIN D) 1000 UNITS tablet Take 1,000 Units by mouth daily.      . citalopram (CELEXA) 40 MG tablet TAKE 1 TABLET BY MOUTH DAILY  30 tablet  5  . clopidogrel (PLAVIX) 75 MG tablet TAKE 1 TABLET BY MOUTH DAILY  30 tablet  6  . Coenzyme Q10 (CO Q 10) 100 MG CAPS Take 1 capsule by mouth daily.      . CRESTOR 20 MG  tablet TAKE 1 TABLET BY MOUTH EVERY DAY.  30 tablet  6  . fluticasone (FLONASE) 50 MCG/ACT nasal spray USE 2 SPRAYS IN NOSE AT BEDTIME  16 g  6  . furosemide (LASIX) 40 MG tablet Take 40 mg by mouth daily.      . nitroGLYCERIN (NITROSTAT) 0.4 MG SL tablet Place 0.4 mg under the tongue every 5 (five) minutes as needed (MAX 3 TABLETS). Chest pain      .  oxyCODONE-acetaminophen (PERCOCET) 10-325 MG per tablet Take 1 tablet by mouth every 6 (six) hours as needed for pain.  30 tablet  0  . pantoprazole (PROTONIX) 40 MG tablet TAKE 1 TABLET BY MOUTH EVERY DAY  30 tablet  4  . potassium chloride (MICRO-K) 10 MEQ CR capsule Take 1 capsule (10 mEq total) by mouth daily.  30 capsule  11  . telmisartan (MICARDIS) 40 MG tablet TAKE 1 TABLET BY MOUTH EVERY DAY  30 tablet  6  . triazolam (HALCION) 0.25 MG tablet Take 1 tablet (0.25 mg total) by mouth at bedtime as needed for sleep.  30 tablet  2  . UNABLE TO FIND CPAP therapy       No current facility-administered medications for this visit.    Socially he's married has 2 children he works in Development worker, international aid. There is a remote tobacco history but he quit in 1989.  ROS General: Negative; No fevers, chills, or night sweats; positive for obesity HEENT: Negative; No changes in vision or hearing, sinus congestion, difficulty swallowing Pulmonary: Negative; No cough, wheezing, shortness of breath, hemoptysis Cardiovascular: See history of present illness;  GI: Negative; No nausea, vomiting, diarrhea, or abdominal pain GU: Positive for GERD; No dysuria, hematuria, or difficulty voiding Musculoskeletal: Negative; no myalgias, joint pain, or weakness Hematologic/Oncology: Negative; no easy bruising, bleeding Endocrine: Negative; no heat/cold intolerance; no diabetes Neuro: Positive for history of migraine headaches; no changes in balance,  Skin: Negative; No rashes or skin lesions Psychiatric: Negative; No behavioral problems, depression Sleep: Positive for  obstructive sleep apnea, now on CPAP therapy; No snoring, daytime sleepiness, hypersomnolence, bruxism, restless legs, hypnogognic hallucinations, no cataplexy Other comprehensive 14 point system review is negative.  PE BP 138/82  Pulse 77  Ht 5\' 8"  (1.727 m)  Wt 262 lb 8 oz (119.069 kg)  BMI 39.92 kg/m2  General: Alert, oriented, no distress. Morbid obesity Skin: normal turgor, no rashes HEENT: Normocephalic, atraumatic. Pupils round and reactive; sclera anicteric;no lid lag.  Nose without nasal septal hypertrophy Mouth/Parynx benign; Mallinpatti scale 3 Neck: Thick neck;No JVD, no carotid bruits with normal carotid upstrokes Lungs: clear to ausculatation and percussion; no wheezing or rales Chest wall: Nontender to palpation Heart: RRR, s1 s2 normal 1/6 sem; no diastolic murmur.  No rubs, thrills or heaves. Abdomen: soft, nontender; no hepatosplenomehaly, BS+; abdominal aorta nontender and not dilated by palpation. Back: No CVA tenderness Pulses 2+  Extremities: no clubbing cyanosis or edema, Homan's sign negative  Neurologic: grossly nonfocal Psychological normal affect and mood  ECG (independently read by me): Normal sinus rhythm with right bundle branch block.  Borderline first-degree AV block with PR interval 206 ms.  No ectopy  Prior February 2015 ECG (independently read by me): Normal sinus rhythm at 66 beats per minute, right bundle branch block with repolarization changes, first-degree AV block.  ECG from 12/12/2012: NSR at 69, RBBB  LABS:  BMET    Component Value Date/Time   NA 136* 06/29/2013 0623   K 4.2 06/29/2013 0623   CL 97 06/29/2013 0623   CO2 24 06/29/2013 0623   GLUCOSE 103* 06/29/2013 0623   BUN 12 06/29/2013 0623   CREATININE 0.81 06/29/2013 0623   CREATININE 0.87 05/03/2013 0915   CALCIUM 8.8 06/29/2013 0623   GFRNONAA >90 06/29/2013 0623   GFRAA >90 06/29/2013 0623     Hepatic Function Panel     Component Value Date/Time   PROT 6.7 05/03/2013 0915    ALBUMIN 4.1 05/03/2013 0915  AST 22 05/03/2013 0915   ALT 25 05/03/2013 0915   ALKPHOS 56 05/03/2013 0915   BILITOT 0.5 05/03/2013 0915     CBC    Component Value Date/Time   WBC 9.4 06/29/2013 0623   RBC 4.29 06/29/2013 0623   HGB 12.6* 06/29/2013 0623   HCT 37.2* 06/29/2013 0623   PLT 224 06/29/2013 0623   MCV 86.7 06/29/2013 0623   MCH 29.4 06/29/2013 0623   MCHC 33.9 06/29/2013 0623   RDW 12.6 06/29/2013 0623   LYMPHSABS 1.7 05/03/2013 0915   MONOABS 0.5 05/03/2013 0915   EOSABS 0.1 05/03/2013 0915   BASOSABS 0.0 05/03/2013 0915     BNP    Component Value Date/Time   PROBNP 36.0 06/04/2010 2206    Lipid Panel     Component Value Date/Time   CHOL 143 05/03/2013 0915   TRIG 63 05/03/2013 0915   TRIG 81 11/21/2012 1025   HDL 45 05/03/2013 0915   CHOLHDL 3.2 05/03/2013 0915   VLDL 13 05/03/2013 0915   LDLCALC 85 05/03/2013 0915   LDLCALC 94 11/21/2012 1025     RADIOLOGY: No results found.    ASSESSMENT AND PLAN: Mr. Malczewski has known CAD and is status post intervention to his proximal and mid LAD as well as his RCA.  He recently developed chest pain symptoms and was referred for repeat cardiac catheterization which was done by Dr. Ellyn Hack. This did not demonstrate significant cardiac etiology to his chest pain. He was empirically started on nitrate therapy but this has resulted in frequent migraine headaches. It is possible that some of his pain may have been due to spasm versus esophageal etiology or musculoskeletal symptomatology.  When I last saw him, I discontinued his nitrates due to his headache and started him on amlodipine 2.5 mg.  He denies any migraine headaches since that time.  He does note some vague chest pain, but this sounds more musculoskeletal.  He does admit to experiencing episodes of palpitations and PVCs.  He is a large man with a body mass index of 39.92, consistent with almost morbid obesity.  I have further titrating his carvedilol to 1-1/2 pills of a 6.25 mg pill  twice a day for the next week and then he will increase this to 12.5 mg twice a day.  I discussed weight loss, increased exercise.  His blood pressure today is stable on his current medical regimen.  He is on aspirin and Plavix and denies bleeding.  He is on Crestor for hyperlipidemia.  He is on pantoprazole for GERD.  He continues to use CPAP with 100% compliance with reference to his obstructive sleep apnea.  He will follow-up laboratory by Dr. Dennard Schaumann next month.  As long as he is doing well.  I will see him in 6 months for cardiology reevaluation.   Troy Sine, MD, [facility]  03/27/2014 3:08 PM

## 2014-03-27 NOTE — Patient Instructions (Signed)
Your physician has recommended you make the following change in your medication: increase the carvedilol to 12. 5 mg twice daily. Take 1 & 1/2  Twice a day of the 6.25 mg tablets for 2 weeks, then start the new prescription.   Your physician wants you to follow-up in: 6 months or sooner if needed with Dr. Claiborne Billings. You will receive a reminder letter in the mail two months in advance. If you don't receive a letter, please call our office to schedule the follow-up appointment.

## 2014-03-28 NOTE — Telephone Encounter (Signed)
Rx was sent to pharmacy electronically. 

## 2014-04-09 ENCOUNTER — Telehealth: Payer: Self-pay | Admitting: Family Medicine

## 2014-04-09 MED ORDER — OXYCODONE-ACETAMINOPHEN 10-325 MG PO TABS: 1.0000 | ORAL_TABLET | Freq: Four times a day (QID) | ORAL | Status: DC | PRN

## 2014-04-09 NOTE — Telephone Encounter (Signed)
Patient needs refill on his percocet   [phone] when ready

## 2014-04-09 NOTE — Telephone Encounter (Signed)
RX printed, left up front and patient aware to pick up  

## 2014-04-09 NOTE — Telephone Encounter (Signed)
?   OK to Refill  

## 2014-04-09 NOTE — Telephone Encounter (Signed)
ok 

## 2014-05-01 ENCOUNTER — Other Ambulatory Visit: Payer: 59

## 2014-05-01 DIAGNOSIS — Z79899 Other long term (current) drug therapy: Secondary | ICD-10-CM

## 2014-05-01 DIAGNOSIS — E785 Hyperlipidemia, unspecified: Secondary | ICD-10-CM

## 2014-05-01 DIAGNOSIS — Z0000 Encounter for general adult medical examination without abnormal findings: Secondary | ICD-10-CM

## 2014-05-01 DIAGNOSIS — I10 Essential (primary) hypertension: Secondary | ICD-10-CM

## 2014-05-01 DIAGNOSIS — Z125 Encounter for screening for malignant neoplasm of prostate: Secondary | ICD-10-CM

## 2014-05-01 LAB — CBC WITH DIFFERENTIAL/PLATELET
BASOS ABS: 0.1 10*3/uL (ref 0.0–0.1)
Basophils Relative: 1 % (ref 0–1)
Eosinophils Absolute: 0.1 10*3/uL (ref 0.0–0.7)
Eosinophils Relative: 2 % (ref 0–5)
HEMATOCRIT: 41.1 % (ref 39.0–52.0)
HEMOGLOBIN: 14.1 g/dL (ref 13.0–17.0)
LYMPHS ABS: 1.7 10*3/uL (ref 0.7–4.0)
LYMPHS PCT: 30 % (ref 12–46)
MCH: 29.2 pg (ref 26.0–34.0)
MCHC: 34.3 g/dL (ref 30.0–36.0)
MCV: 85.1 fL (ref 78.0–100.0)
MONO ABS: 0.6 10*3/uL (ref 0.1–1.0)
MONOS PCT: 11 % (ref 3–12)
MPV: 8.7 fL — ABNORMAL LOW (ref 9.4–12.4)
NEUTROS ABS: 3.1 10*3/uL (ref 1.7–7.7)
Neutrophils Relative %: 56 % (ref 43–77)
PLATELETS: 221 10*3/uL (ref 150–400)
RBC: 4.83 MIL/uL (ref 4.22–5.81)
RDW: 13.5 % (ref 11.5–15.5)
WBC: 5.5 10*3/uL (ref 4.0–10.5)

## 2014-05-01 LAB — COMPREHENSIVE METABOLIC PANEL
ALBUMIN: 4.2 g/dL (ref 3.5–5.2)
ALT: 25 U/L (ref 0–53)
AST: 20 U/L (ref 0–37)
Alkaline Phosphatase: 52 U/L (ref 39–117)
BUN: 11 mg/dL (ref 6–23)
CO2: 29 meq/L (ref 19–32)
Calcium: 9.5 mg/dL (ref 8.4–10.5)
Chloride: 101 mEq/L (ref 96–112)
Creat: 0.86 mg/dL (ref 0.50–1.35)
Glucose, Bld: 101 mg/dL — ABNORMAL HIGH (ref 70–99)
POTASSIUM: 5 meq/L (ref 3.5–5.3)
Sodium: 137 mEq/L (ref 135–145)
Total Bilirubin: 0.5 mg/dL (ref 0.2–1.2)
Total Protein: 6.6 g/dL (ref 6.0–8.3)

## 2014-05-01 LAB — LIPID PANEL
CHOLESTEROL: 155 mg/dL (ref 0–200)
HDL: 48 mg/dL (ref >39)
LDL Cholesterol: 97 mg/dL (ref 0–99)
Total CHOL/HDL Ratio: 3.2 Ratio
Triglycerides: 48 mg/dL (ref <150)
VLDL: 10 mg/dL (ref 0–40)

## 2014-05-02 LAB — PSA: PSA: 1.17 ng/mL (ref <=4.00)

## 2014-05-06 ENCOUNTER — Encounter: Payer: Self-pay | Admitting: Family Medicine

## 2014-05-06 ENCOUNTER — Ambulatory Visit (INDEPENDENT_AMBULATORY_CARE_PROVIDER_SITE_OTHER): Payer: 59 | Admitting: Family Medicine

## 2014-05-06 VITALS — BP 128/74 | HR 76 | Temp 98.0°F | Resp 18 | Ht 68.0 in | Wt 262.0 lb

## 2014-05-06 DIAGNOSIS — Z0000 Encounter for general adult medical examination without abnormal findings: Secondary | ICD-10-CM

## 2014-05-06 DIAGNOSIS — R0609 Other forms of dyspnea: Secondary | ICD-10-CM

## 2014-05-06 NOTE — Progress Notes (Signed)
Subjective:    Patient ID: Lee [name], male    DOB: 01-May-1951, 63 y.o.   MRN: [id number]  HPI Patient is here today for complete physical exam. He does report some mild dyspnea on exertion. He attributes this to deconditioning. He has had an extensive cardiac workup with no evidence of congestive heart failure. He does have morbid obesity and is not exercising regularly. He also has a 25-pack-year history of smoking although he quit decades ago. He denies any wheezing or cough. He denies any hemoptysis. He has not recently had a chest x-ray. His last colonoscopy was 2-3 years ago and is up-to-date. He is due for prostate exam today. Patient is due for Pneumovax 23 next year and Prevnar 90 at age 45. His flu shot is up-to-date. His most recent lab work as listed below: Lab on 05/01/2014  Component Date Value Ref Range Status  . WBC 05/01/2014 5.5  4.0 - 10.5 K/uL Final  . RBC 05/01/2014 4.83  4.22 - 5.81 MIL/uL Final  . Hemoglobin 05/01/2014 14.1  13.0 - 17.0 g/dL Final  . HCT 05/01/2014 41.1  39.0 - 52.0 % Final  . MCV 05/01/2014 85.1  78.0 - 100.0 fL Final  . MCH 05/01/2014 29.2  26.0 - 34.0 pg Final  . MCHC 05/01/2014 34.3  30.0 - 36.0 g/dL Final  . RDW 05/01/2014 13.5  11.5 - 15.5 % Final  . Platelets 05/01/2014 221  150 - 400 K/uL Final  . MPV 05/01/2014 8.7* 9.4 - 12.4 fL Final  . Neutrophils Relative % 05/01/2014 56  43 - 77 % Final  . Neutro Abs 05/01/2014 3.1  1.7 - 7.7 K/uL Final  . Lymphocytes Relative 05/01/2014 30  12 - 46 % Final  . Lymphs Abs 05/01/2014 1.7  0.7 - 4.0 K/uL Final  . Monocytes Relative 05/01/2014 11  3 - 12 % Final  . Monocytes Absolute 05/01/2014 0.6  0.1 - 1.0 K/uL Final  . Eosinophils Relative 05/01/2014 2  0 - 5 % Final  . Eosinophils Absolute 05/01/2014 0.1  0.0 - 0.7 K/uL Final  . Basophils Relative 05/01/2014 1  0 - 1 % Final  . Basophils Absolute 05/01/2014 0.1  0.0 - 0.1 K/uL Final  . Smear Review 05/01/2014 Criteria for review not met    Final  . Sodium 05/01/2014 137  135 - 145 mEq/L Final  . Potassium 05/01/2014 5.0  3.5 - 5.3 mEq/L Final  . Chloride 05/01/2014 101  96 - 112 mEq/L Final  . CO2 05/01/2014 29  19 - 32 mEq/L Final  . Glucose, Bld 05/01/2014 101* 70 - 99 mg/dL Final  . BUN 05/01/2014 11  6 - 23 mg/dL Final  . Creat 05/01/2014 0.86  0.50 - 1.35 mg/dL Final  . Total Bilirubin 05/01/2014 0.5  0.2 - 1.2 mg/dL Final  . Alkaline Phosphatase 05/01/2014 52  39 - 117 U/L Final  . AST 05/01/2014 20  0 - 37 U/L Final  . ALT 05/01/2014 25  0 - 53 U/L Final  . Total Protein 05/01/2014 6.6  6.0 - 8.3 g/dL Final  . Albumin 05/01/2014 4.2  3.5 - 5.2 g/dL Final  . Calcium 05/01/2014 9.5  8.4 - 10.5 mg/dL Final  . Cholesterol 05/01/2014 155  0 - 200 mg/dL Final   Comment: ATP III Classification:       < 200        mg/dL        Desirable  200 - 239     mg/dL        Borderline High      >= 240        mg/dL        High     . Triglycerides 05/01/2014 48  <150 mg/dL Final  . HDL 05/01/2014 48  >39 mg/dL Final  . Total CHOL/HDL Ratio 05/01/2014 3.2   Final  . VLDL 05/01/2014 10  0 - 40 mg/dL Final  . LDL Cholesterol 05/01/2014 97  0 - 99 mg/dL Final   Comment:   Total Cholesterol/HDL Ratio:CHD Risk                        Coronary Heart Disease Risk Table                                        Men       Women          1/2 Average Risk              3.4        3.3              Average Risk              5.0        4.4           2X Average Risk              9.6        7.1           3X Average Risk             23.4       11.0 Use the calculated Patient Ratio above and the CHD Risk table  to determine the patient's CHD Risk. ATP III Classification (LDL):       < 100        mg/dL         Optimal      100 - 129     mg/dL         Near or Above Optimal      130 - 159     mg/dL         Borderline High      160 - 189     mg/dL         High       > 190        mg/dL         Very High     . PSA 05/01/2014 1.17  <=4.00 ng/mL  Final   Comment: Test Methodology: ECLIA PSA (Electrochemiluminescence Immunoassay)   For PSA values from 2.5-4.0, particularly in younger men <60 years old, the AUA and NCCN suggest testing for % Free PSA (3515) and evaluation of the rate of increase in PSA (PSA velocity).    Past Medical History  Diagnosis Date  . Arthritis   . Hyperlipidemia   . Hypertension   . COPD (chronic obstructive pulmonary disease)   . PVC's (premature ventricular contractions)   . Paroxysmal atrial fibrillation     s/p afib ablation 10/22/08  . CAD (coronary artery disease)     PCI RCA and LAD 1/12  . DJD (degenerative joint disease)   . Migraines   . Iron deficiency anemia   .  Esophagitis 1991    grade 1  . Diverticulosis   . Hemorrhoids   . GERD (gastroesophageal reflux disease)     Hiatal hernia  . Allergic rhinitis     chronic sinusitis  . Cataract     has had lasik surg  . History of stress test 07/2012    normal prefusion and function with out scar or ischemia  . Sleep apnea     Questionalble: RDI during the total sleep time 6h 28 mins was 3.55/hr during REM sleep was at 5.71/hr. Supine AHI was 5.93/hr.   Past Surgical History  Procedure Laterality Date  . Lasik    . Nasal sinus surgery  2001  . Coronary angioplasty with stent placement      3 stents/ 4 caths  . Knee arthroscopy      right  . Neck surgery      Cervical fusion  . Shoulder open rotator cuff repair      right  . Rotator cuff repair      left  . Wrist arthrocentesis      left  . Spine surgery    . Radiofrequency ablation  May 2010    atrial fibrillation  . Eye surgery    . Cardiac catheterization  05/2010    The proximal LAD was then predilated with 2.0 x 12 trek. this was then stented with a 2.5 x 16 promus Element drug-eluting stent at 14 atmosphere and prostdilated with 2.75 x 12 [place] trek at 16 atmospheres(2.8 mm) resulting the reduction of 80% proximal LAD stenosis to 0% residual with excellent flow.  .  Cardiac catheterization  06/05/2010    Successful percutaneous coronary intervention to the right coronary artery with percutaneous transluminal coronary angioplasty/stenting and insertion 3.0 x 16 mm Promus DES post dilated to 3.35 mm with stenosis being reduced to 0%  . Cardiac catheterization  02/2008    Post dilatation was performed using a 2.75 x 9 [place] sprinter, 10 atmospheres for 40 seconds and then 9 atmosphere for 35 sec. This resulted in the 80% area of narrowing pre-intervention, now appearing to normal. There was no edvidence of the dissection or thrombus and there was TIMI III flow pre and post.  . Cardiac catheterization  02/2006   Current Outpatient Prescriptions on File Prior to Visit  Medication Sig Dispense Refill  . amLODipine (NORVASC) 2.5 MG tablet TAKE 1 TABLET BY MOUTH EVERY DAY 30 tablet 6  . aspirin 81 MG tablet Take 81 mg by mouth daily.    . carvedilol (COREG) 12.5 MG tablet Take 1 tablet twice a day. 60 tablet 6  . cholecalciferol (VITAMIN D) 1000 UNITS tablet Take 1,000 Units by mouth daily.    . citalopram (CELEXA) 40 MG tablet TAKE 1 TABLET BY MOUTH DAILY 30 tablet 5  . clopidogrel (PLAVIX) 75 MG tablet TAKE 1 TABLET BY MOUTH DAILY 30 tablet 6  . Coenzyme Q10 (CO Q 10) 100 MG CAPS Take 1 capsule by mouth daily.    . CRESTOR 20 MG tablet TAKE 1 TABLET BY MOUTH EVERY DAY. 30 tablet 6  . fluticasone (FLONASE) 50 MCG/ACT nasal spray USE 2 SPRAYS IN NOSE AT BEDTIME 16 g 6  . furosemide (LASIX) 40 MG tablet Take 40 mg by mouth daily.    . nitroGLYCERIN (NITROSTAT) 0.4 MG SL tablet Place 0.4 mg under the tongue every 5 (five) minutes as needed (MAX 3 TABLETS). Chest pain    . oxyCODONE-acetaminophen (PERCOCET) 10-325 MG per tablet Take  1 tablet by mouth every 6 (six) hours as needed for pain. 30 tablet 0  . pantoprazole (PROTONIX) 40 MG tablet TAKE 1 TABLET BY MOUTH EVERY DAY 30 tablet 11  . potassium chloride (MICRO-K) 10 MEQ CR capsule Take 1 capsule (10 mEq total) by  mouth daily. 30 capsule 11  . telmisartan (MICARDIS) 40 MG tablet TAKE 1 TABLET BY MOUTH EVERY DAY 30 tablet 6  . triazolam (HALCION) 0.25 MG tablet Take 1 tablet (0.25 mg total) by mouth at bedtime as needed for sleep. 30 tablet 2  . UNABLE TO FIND CPAP therapy     No current facility-administered medications on file prior to visit.   Allergies  Allergen Reactions  . Ibuprofen Swelling  . Other Shortness Of Breath    BETA BLOCKERS  . Toprol Xl [Metoprolol Succinate] Other (See Comments)    Personality change   History   Social History  . Marital Status: Married    Spouse Name: N/A    Number of Children: 2  . Years of Education: N/A   Occupational History  . retired    Social History Main Topics  . Smoking status: Former Smoker    Quit date: 06/01/1987  . Smokeless tobacco: Never Used  . Alcohol Use: Yes     Comment: once every 6-7 months  . Drug Use: No  . Sexual Activity: Not on file   Other Topics Concern  . Not on file   Social History Narrative   Family History  Problem Relation Age of Onset  . Heart disease Father     and sister  . Diabetes Father   . Colon cancer Mother     mets from uterine  . Uterine cancer Mother   . Heart disease Sister       Review of Systems  All other systems reviewed and are negative.      Objective:   Physical Exam  Constitutional: He is oriented to person, place, and time. He appears well-developed and well-nourished. No distress.  HENT:  Head: Normocephalic and atraumatic.  Right Ear: External ear normal.  Left Ear: External ear normal.  Nose: Nose normal.  Mouth/Throat: Oropharynx is clear and moist. No oropharyngeal exudate.  Eyes: Conjunctivae and EOM are normal. Pupils are equal, round, and reactive to light. Right eye exhibits no discharge. Left eye exhibits no discharge. No scleral icterus.  Neck: Normal range of motion. Neck supple. No JVD present. No tracheal deviation present. No thyromegaly present.    Cardiovascular: Normal rate, regular rhythm, normal heart sounds and intact distal pulses.  Exam reveals no gallop and no friction rub.   No murmur heard. Pulmonary/Chest: Effort normal and breath sounds normal. No stridor. No respiratory distress. He has no wheezes. He has no rales. He exhibits no tenderness.  Abdominal: Soft. Bowel sounds are normal. He exhibits no distension and no mass. There is no tenderness. There is no rebound and no guarding.  Genitourinary: Rectum normal, prostate normal and penis normal.  Musculoskeletal: Normal range of motion. He exhibits no edema or tenderness.  Lymphadenopathy:    He has no cervical adenopathy.  Neurological: He is alert and oriented to person, place, and time. He has normal reflexes. He displays normal reflexes. No cranial nerve deficit. He exhibits normal muscle tone. Coordination normal.  Skin: Skin is warm. No rash noted. He is not diaphoretic. No erythema. No pallor.  Psychiatric: He has a normal mood and affect. His behavior is normal. Judgment and thought content normal.  Vitals reviewed.         Assessment & Plan:  Dyspnea on exertion - Plan: DG Chest 2 View  Routine general medical examination at a health care facility  I will check a chest x-ray given his dyspnea on exertion. His chest x-ray is normal, I would recommend increasing his aerobic exercise to try to treat deconditioning. The patient continues to have mild dyspnea on exertion, I would then perform spirometry to evaluate for COPD. The remainder of the patient's lab work is excellent. I did recommend a low carbohydrate diet, increasing aerobic exercise, and significant weight loss. Patient received Prevnar 13 at 70 and Pneumovax 23 at 40. Otherwise his immunizations are up-to-date. His colon cancer screening is up-to-date. His rectal exam and PSA were within normal limits.

## 2014-05-08 ENCOUNTER — Other Ambulatory Visit: Payer: Self-pay | Admitting: Family Medicine

## 2014-05-09 ENCOUNTER — Encounter (HOSPITAL_COMMUNITY): Payer: Self-pay | Admitting: Cardiology

## 2014-05-09 NOTE — Telephone Encounter (Signed)
Medication refilled per protocol. 

## 2014-05-10 ENCOUNTER — Ambulatory Visit
Admission: RE | Admit: 2014-05-10 | Discharge: 2014-05-10 | Disposition: A | Discharge status: Home / Self Care | Payer: 59 | Source: Ambulatory Visit | Attending: Family Medicine | Admitting: Family Medicine

## 2014-05-10 DIAGNOSIS — R0609 Other forms of dyspnea: Principal | ICD-10-CM

## 2014-05-19 ENCOUNTER — Other Ambulatory Visit: Payer: Self-pay | Admitting: Family Medicine

## 2014-05-19 ENCOUNTER — Other Ambulatory Visit: Payer: Self-pay | Admitting: Cardiovascular Disease

## 2014-05-20 NOTE — Telephone Encounter (Signed)
Rx refill sent to patient pharmacy   

## 2014-05-29 ENCOUNTER — Telehealth: Payer: Self-pay | Admitting: Cardiovascular Disease

## 2014-05-29 MED ORDER — NITROGLYCERIN 0.4 MG SL SUBL: 0.4000 mg | SUBLINGUAL_TABLET | SUBLINGUAL | Status: DC | PRN

## 2014-05-29 NOTE — Telephone Encounter (Signed)
Needing a verbal on a prescription

## 2014-05-29 NOTE — Telephone Encounter (Signed)
Pt was at [facility], requested refill of Nitrostat. Sent electronically.

## 2014-06-17 ENCOUNTER — Ambulatory Visit: Payer: 59 | Admitting: Internal Medicine

## 2014-06-25 ENCOUNTER — Ambulatory Visit (INDEPENDENT_AMBULATORY_CARE_PROVIDER_SITE_OTHER): Payer: 59 | Admitting: Podiatry

## 2014-06-25 DIAGNOSIS — Q828 Other specified congenital malformations of skin: Secondary | ICD-10-CM

## 2014-06-25 NOTE — Progress Notes (Signed)
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 He states that they were doing great and has grown back his been ready to have them removed since December.  Objective: Vital signs are stable he is alert and oriented 3 pulses remain palpable. Porokeratotic lesion sub-fifth metatarsal bilateral is present. No signs of infection.  Assessment: Pain in limb secondary porokeratosis plantar aspect fifth metatarsal bilateral.  Plan: Debrided porokeratotic lesions 2 today follow-up with him in 3 months.

## 2014-06-29 ENCOUNTER — Other Ambulatory Visit: Payer: Self-pay | Admitting: Cardiovascular Disease

## 2014-07-01 NOTE — Telephone Encounter (Signed)
Rx refill sent to patient pharmacy   

## 2014-07-18 ENCOUNTER — Telehealth: Payer: Self-pay | Admitting: *Deleted

## 2014-07-18 NOTE — Telephone Encounter (Signed)
Returned CPAP supply order to [facility]. 

## 2014-07-19 ENCOUNTER — Other Ambulatory Visit: Payer: Self-pay | Admitting: Cardiovascular Disease

## 2014-07-19 NOTE — Telephone Encounter (Signed)
Rx(s) sent to pharmacy electronically.  

## 2014-07-24 ENCOUNTER — Other Ambulatory Visit: Payer: Self-pay | Admitting: Cardiovascular Disease

## 2014-07-25 NOTE — Telephone Encounter (Signed)
Rx has been sent to the pharmacy electronically. ° °

## 2014-08-16 ENCOUNTER — Telehealth: Payer: Self-pay | Admitting: Cardiovascular Disease

## 2014-08-19 NOTE — Telephone Encounter (Signed)
Closed encounter °

## 2014-08-21 ENCOUNTER — Other Ambulatory Visit: Payer: Self-pay | Admitting: Family Medicine

## 2014-08-22 NOTE — Telephone Encounter (Signed)
Refill appropriate and filled per protocol. 

## 2014-08-28 ENCOUNTER — Telehealth: Payer: Self-pay | Admitting: Family Medicine

## 2014-08-28 NOTE — Telephone Encounter (Signed)
Patient calling for refill on oxycodone  [phone]

## 2014-08-28 NOTE — Telephone Encounter (Signed)
?   OK to Refill  

## 2014-08-29 MED ORDER — OXYCODONE-ACETAMINOPHEN 10-325 MG PO TABS: 1.0000 | ORAL_TABLET | Freq: Four times a day (QID) | ORAL | Status: DC | PRN

## 2014-08-29 NOTE — Telephone Encounter (Signed)
RX printed, left up front and patient aware to pick up  

## 2014-08-29 NOTE — Telephone Encounter (Signed)
ok 

## 2014-09-03 ENCOUNTER — Ambulatory Visit (INDEPENDENT_AMBULATORY_CARE_PROVIDER_SITE_OTHER): Payer: 59 | Admitting: Family Medicine

## 2014-09-03 ENCOUNTER — Encounter: Payer: Self-pay | Admitting: Family Medicine

## 2014-09-03 VITALS — BP 118/70 | HR 80 | Temp 97.8°F | Resp 16 | Ht 68.0 in | Wt 272.0 lb

## 2014-09-03 DIAGNOSIS — S83241A Other tear of medial meniscus, current injury, right knee, initial encounter: Secondary | ICD-10-CM

## 2014-09-03 NOTE — Progress Notes (Signed)
Subjective:    Patient ID: Lee [name], male    DOB: 1951-05-07, 64 y.o.   MRN: [id number]  HPI Patient presents with 2 weeks of worsening knee pain in his right knee. He hurts in the posterior medial knee joint as well as the posterior lateral knee joint. On examination today there is no laxity to varus or valgus stress. He has a negative anterior and posterior drawer sign. However Apley grind test elicit significant pain in his medial meniscal area. He also has some mild tenderness to palpation in the joint lines bilaterally. The patient denies any specific injury. He does have a history of a meniscal tear in that same knee. The pain began gradually while he was walking 2 weeks ago Past Medical History  Diagnosis Date  . Arthritis   . Hyperlipidemia   . Hypertension   . COPD (chronic obstructive pulmonary disease)   . PVC's (premature ventricular contractions)   . Paroxysmal atrial fibrillation     s/p afib ablation 10/22/08  . CAD (coronary artery disease)     PCI RCA and LAD 1/12  . DJD (degenerative joint disease)   . Migraines   . Iron deficiency anemia   . Esophagitis 1991    grade 1  . Diverticulosis   . Hemorrhoids   . GERD (gastroesophageal reflux disease)     Hiatal hernia  . Allergic rhinitis     chronic sinusitis  . Cataract     has had lasik surg  . History of stress test 07/2012    normal prefusion and function with out scar or ischemia  . Sleep apnea     Questionalble: RDI during the total sleep time 6h 28 mins was 3.55/hr during REM sleep was at 5.71/hr. Supine AHI was 5.93/hr.   Past Surgical History  Procedure Laterality Date  . Lasik    . Nasal sinus surgery  2001  . Coronary angioplasty with stent placement      3 stents/ 4 caths  . Knee arthroscopy      right  . Neck surgery      Cervical fusion  . Shoulder open rotator cuff repair      right  . Rotator cuff repair      left  . Wrist arthrocentesis      left  . Spine surgery    .  Radiofrequency ablation  May 2010    atrial fibrillation  . Eye surgery    . Cardiac catheterization  05/2010    The proximal LAD was then predilated with 2.0 x 12 trek. this was then stented with a 2.5 x 16 promus Element drug-eluting stent at 14 atmosphere and prostdilated with 2.75 x 12 [place] trek at 16 atmospheres(2.8 mm) resulting the reduction of 80% proximal LAD stenosis to 0% residual with excellent flow.  . Cardiac catheterization  06/05/2010    Successful percutaneous coronary intervention to the right coronary artery with percutaneous transluminal coronary angioplasty/stenting and insertion 3.0 x 16 mm Promus DES post dilated to 3.35 mm with stenosis being reduced to 0%  . Cardiac catheterization  02/2008    Post dilatation was performed using a 2.75 x 9 [place] sprinter, 10 atmospheres for 40 seconds and then 9 atmosphere for 35 sec. This resulted in the 80% area of narrowing pre-intervention, now appearing to normal. There was no edvidence of the dissection or thrombus and there was TIMI III flow pre and post.  . Cardiac catheterization  02/2006  . Left heart  catheterization with coronary angiogram N/A 06/29/2013    Procedure: LEFT HEART CATHETERIZATION WITH CORONARY ANGIOGRAM;  Surgeon: Leonie Man, MD;  Location: [facility] CATH LAB;  Service: Cardiovascular;  Laterality: N/A;   Current Outpatient Prescriptions on File Prior to Visit  Medication Sig Dispense Refill  . amLODipine (NORVASC) 2.5 MG tablet Take 1 tablet (2.5 mg total) by mouth daily. 30 tablet 8  . aspirin 81 MG tablet Take 81 mg by mouth daily.    . carvedilol (COREG) 12.5 MG tablet Take 1 tablet twice a day. 60 tablet 6  . cholecalciferol (VITAMIN D) 1000 UNITS tablet Take 1,000 Units by mouth daily.    . citalopram (CELEXA) 40 MG tablet TAKE 1 TABLET BY MOUTH DAILY 30 tablet 6  . clopidogrel (PLAVIX) 75 MG tablet TAKE 1 TABLET BY MOUTH EVERY DAY 30 tablet 6  . Coenzyme Q10 (CO Q 10) 100 MG CAPS Take 1 capsule by mouth daily.      . CRESTOR 20 MG tablet TAKE 1 TABLET BY MOUTH EVERY DAY 30 tablet 6  . fluticasone (FLONASE) 50 MCG/ACT nasal spray INSTILL 2 SPRAYS IN THE NOSE AT BEDTIME 16 g 11  . furosemide (LASIX) 40 MG tablet Take 40 mg by mouth daily.    . nitroGLYCERIN (NITROSTAT) 0.4 MG SL tablet Place 1 tablet (0.4 mg total) under the tongue every 5 (five) minutes as needed (MAX 3 TABLETS). Chest pain 25 tablet 3  . oxyCODONE-acetaminophen (PERCOCET) 10-325 MG per tablet Take 1 tablet by mouth every 6 (six) hours as needed for pain. 30 tablet 0  . pantoprazole (PROTONIX) 40 MG tablet TAKE 1 TABLET BY MOUTH EVERY DAY 30 tablet 11  . potassium chloride (MICRO-K) 10 MEQ CR capsule TAKE 1 CAPSULE BY MOUTH EVERY DAY 30 capsule 3  . telmisartan (MICARDIS) 40 MG tablet TAKE 1 TABLET BY MOUTH EVERY DAY 30 tablet 5  . triazolam (HALCION) 0.25 MG tablet Take 1 tablet (0.25 mg total) by mouth at bedtime as needed for sleep. 30 tablet 2  . UNABLE TO FIND CPAP therapy     No current facility-administered medications on file prior to visit.   Allergies  Allergen Reactions  . Ibuprofen Swelling  . Other Shortness Of Breath    BETA BLOCKERS  . Toprol Xl [Metoprolol Succinate] Other (See Comments)    Personality change   History   Social History  . Marital Status: Married    Spouse Name: N/A  . Number of Children: 2  . Years of Education: N/A   Occupational History  . retired    Social History Main Topics  . Smoking status: Former Smoker    Quit date: 06/01/1987  . Smokeless tobacco: Never Used  . Alcohol Use: Yes     Comment: once every 6-7 months  . Drug Use: No  . Sexual Activity: Not on file   Other Topics Concern  . Not on file   Social History Narrative      Review of Systems  All other systems reviewed and are negative.      Objective:   Physical Exam  Cardiovascular: Normal rate, regular rhythm and normal heart sounds.   Pulmonary/Chest: Effort normal and breath sounds normal.   Musculoskeletal:       Right knee: He exhibits decreased range of motion, effusion and abnormal meniscus. He exhibits no LCL laxity, normal patellar mobility and no MCL laxity. Tenderness found. Medial joint line and lateral joint line tenderness noted. No MCL and no LCL  tenderness noted.  Vitals reviewed.         Assessment & Plan:  Acute medial meniscal tear, right, initial encounter  I recommended conservative management initially. Offer the patient a cortisone injection but he declined today. He would like to try pain medication and tincture of time. If not improving over the next 2-3 weeks with time and rest ice and pain medication, he will return for cortisone injection. If no better after the cortisone injection, I would proceed with an MRI to evaluate further

## 2014-09-06 ENCOUNTER — Other Ambulatory Visit: Payer: Self-pay | Admitting: Cardiovascular Disease

## 2014-09-19 ENCOUNTER — Ambulatory Visit (INDEPENDENT_AMBULATORY_CARE_PROVIDER_SITE_OTHER): Payer: 59 | Admitting: Cardiovascular Disease

## 2014-09-19 ENCOUNTER — Encounter: Payer: Self-pay | Admitting: Cardiovascular Disease

## 2014-09-19 VITALS — BP 124/80 | HR 68 | Ht 68.0 in | Wt 270.2 lb

## 2014-09-19 DIAGNOSIS — I10 Essential (primary) hypertension: Secondary | ICD-10-CM

## 2014-09-19 DIAGNOSIS — I2510 Atherosclerotic heart disease of native coronary artery without angina pectoris: Secondary | ICD-10-CM

## 2014-09-19 DIAGNOSIS — I4891 Unspecified atrial fibrillation: Secondary | ICD-10-CM | POA: Diagnosis not present

## 2014-09-19 DIAGNOSIS — I25810 Atherosclerosis of coronary artery bypass graft(s) without angina pectoris: Secondary | ICD-10-CM

## 2014-09-19 DIAGNOSIS — G4733 Obstructive sleep apnea (adult) (pediatric): Secondary | ICD-10-CM

## 2014-09-19 DIAGNOSIS — E785 Hyperlipidemia, unspecified: Secondary | ICD-10-CM

## 2014-09-19 DIAGNOSIS — R002 Palpitations: Secondary | ICD-10-CM | POA: Diagnosis not present

## 2014-09-19 MED ORDER — ROSUVASTATIN CALCIUM 20 MG PO TABS: 40.0000 mg | ORAL_TABLET | Freq: Every day | ORAL | Status: DC

## 2014-09-19 MED ORDER — CARVEDILOL 12.5 MG PO TABS: ORAL_TABLET | ORAL | Status: DC

## 2014-09-19 NOTE — Patient Instructions (Signed)
Your physician recommends that you return for lab work in: 3 months.  Your physician has recommended you make the following change in your medication: increase the carvedilol to 1.5 tablets in the morning and 1 tablet at night. Increase the crestor to 40 mg daily.  Your physician wants you to follow-up in: 6 months or sooner if needed. You will receive a reminder letter in the mail two months in advance. If you don't receive a letter, please call our office to schedule the follow-up appointment.

## 2014-09-21 ENCOUNTER — Encounter: Payer: Self-pay | Admitting: Cardiovascular Disease

## 2014-09-21 NOTE — Progress Notes (Signed)
Patient ID: Lee [name], male   DOB: 06-10-1950, 64 y.o.   MRN: [id number]    HPI: Lee [name], is a 64 y.o. male who presents to the office today for a 6 month followup cardiology evaluation.  [name] has known CAD and in 2009 underwent stenting of his mid LAD. In 2012 he underwent staged intervention with stenting of his proximal RCA and at a new site in his proximal LAD. A nuclear perfusion study February 2014 showed normal perfusion and function. In January 2015, he was seen by [name] in the office with complaints of chest pressure. He underwent cardiac catheterization this chest pain which was done by Dr. [name] on 06/29/2013. Catheterization did not demonstrate significant restenosis of his LAD stents with mild 20% narrowing in the proximal stent and 40% mid stent. The circumflex was normal. His RCA had 40% narrowing just proximal to a widely patent stent in the mid vessel. Nitrate therapy was added to his medical regimen but due to significant nitrate mediated headaches this ultimately was discontinued.   In 2007, a sleep study which suggested upper airway resistance syndrome without overall sleep apnea with the exception of mild sleep apnea with REM sleep at that time CPAP was not recommended. In June 2014 he was having symptoms suggestive of progressive sleep apnea. He ultimately underwent a sleep study. I do not have the specifics of this diagnostic polysomnogram but subsequently he did undergo a CPAP titration to apparently his AHI was 12 on his initial study and he dropped his oxygen from 96% to 83%. He ultimately was titrated up to a CPAP pressure of 11 cm. He admits to 100% compliance with reference to his obstructive sleep apnea.  Additional problems also include obesity.  When I last saw him, he was complaining of experiencing episodes of palpitations.  Body mass index was 39.92.  I further titrated his carvedilol from 6.25 mg twice a day, 29.375 milligrams twice  a day for 1 week and then further titrated this to 12.5 mg twice a day.  He has felt improved with reference to palpitations, they are occurring less frequently, but when they still occur.  They still have the same force.  He continues to use CPAP.  Laboratory from 05/01/2014 was reviewed which revealed an LDL cholesterol of 97.  He presents for evaluation.  Past Medical History  Diagnosis Date  . Arthritis   . Hyperlipidemia   . Hypertension   . COPD (chronic obstructive pulmonary disease)   . PVC's (premature ventricular contractions)   . Paroxysmal atrial fibrillation     s/p afib ablation 10/22/08  . CAD (coronary artery disease)     PCI RCA and LAD 1/12  . DJD (degenerative joint disease)   . Migraines   . Iron deficiency anemia   . Esophagitis 1991    grade 1  . Diverticulosis   . Hemorrhoids   . GERD (gastroesophageal reflux disease)     Hiatal hernia  . Allergic rhinitis     chronic sinusitis  . Cataract     has had lasik surg  . History of stress test 07/2012    normal prefusion and function with out scar or ischemia  . Sleep apnea     Questionalble: RDI during the total sleep time 6h 28 mins was 3.55/hr during REM sleep was at 5.71/hr. Supine AHI was 5.93/hr.    Past Surgical History  Procedure Laterality Date  . Lasik    . Nasal sinus  surgery  2001  . Coronary angioplasty with stent placement      3 stents/ 4 caths  . Knee arthroscopy      right  . Neck surgery      Cervical fusion  . Shoulder open rotator cuff repair      right  . Rotator cuff repair      left  . Wrist arthrocentesis      left  . Spine surgery    . Radiofrequency ablation  May 2010    atrial fibrillation  . Eye surgery    . Cardiac catheterization  05/2010    The proximal LAD was then predilated with 2.0 x 12 trek. this was then stented with a 2.5 x 16 promus Element drug-eluting stent at 14 atmosphere and prostdilated with 2.75 x 12 [place] trek at 16 atmospheres(2.8 mm) resulting the  reduction of 80% proximal LAD stenosis to 0% residual with excellent flow.  . Cardiac catheterization  06/05/2010    Successful percutaneous coronary intervention to the right coronary artery with percutaneous transluminal coronary angioplasty/stenting and insertion 3.0 x 16 mm Promus DES post dilated to 3.35 mm with stenosis being reduced to 0%  . Cardiac catheterization  02/2008    Post dilatation was performed using a 2.75 x 9 [place] sprinter, 10 atmospheres for 40 seconds and then 9 atmosphere for 35 sec. This resulted in the 80% area of narrowing pre-intervention, now appearing to normal. There was no edvidence of the dissection or thrombus and there was TIMI III flow pre and post.  . Cardiac catheterization  02/2006  . Left heart catheterization with coronary angiogram N/A 06/29/2013    Procedure: LEFT HEART CATHETERIZATION WITH CORONARY ANGIOGRAM;  Surgeon: Leonie Man, MD;  Location: [facility] CATH LAB;  Service: Cardiovascular;  Laterality: N/A;    Allergies  Allergen Reactions  . Ibuprofen Swelling  . Other Shortness Of Breath    BETA BLOCKERS  . Toprol Xl [Metoprolol Succinate] Other (See Comments)    Personality change    Current Outpatient Prescriptions  Medication Sig Dispense Refill  . amLODipine (NORVASC) 2.5 MG tablet Take 1 tablet (2.5 mg total) by mouth daily. 30 tablet 8  . aspirin 81 MG tablet Take 81 mg by mouth daily.    . cholecalciferol (VITAMIN D) 1000 UNITS tablet Take 1,000 Units by mouth daily.    . citalopram (CELEXA) 40 MG tablet TAKE 1 TABLET BY MOUTH DAILY 30 tablet 6  . clopidogrel (PLAVIX) 75 MG tablet TAKE 1 TABLET BY MOUTH EVERY DAY 30 tablet 6  . Coenzyme Q10 (CO Q 10) 100 MG CAPS Take 1 capsule by mouth daily.    . fluticasone (FLONASE) 50 MCG/ACT nasal spray INSTILL 2 SPRAYS IN THE NOSE AT BEDTIME 16 g 11  . furosemide (LASIX) 40 MG tablet TAKE 1 TABLET BY MOUTH EVERY DAY 30 tablet 5  . nitroGLYCERIN (NITROSTAT) 0.4 MG SL tablet Place 1 tablet (0.4 mg  total) under the tongue every 5 (five) minutes as needed (MAX 3 TABLETS). Chest pain 25 tablet 3  . oxyCODONE-acetaminophen (PERCOCET) 10-325 MG per tablet Take 1 tablet by mouth every 6 (six) hours as needed for pain. 30 tablet 0  . pantoprazole (PROTONIX) 40 MG tablet TAKE 1 TABLET BY MOUTH EVERY DAY 30 tablet 11  . potassium chloride (MICRO-K) 10 MEQ CR capsule TAKE ONE CAPSULE BY MOUTH EVERY DAY 30 capsule 5  . rosuvastatin (CRESTOR) 20 MG tablet Take 2 tablets (40 mg total) by mouth daily.  60 tablet 6  . telmisartan (MICARDIS) 40 MG tablet TAKE 1 TABLET BY MOUTH EVERY DAY 30 tablet 5  . triazolam (HALCION) 0.25 MG tablet Take 1 tablet (0.25 mg total) by mouth at bedtime as needed for sleep. 30 tablet 2  . UNABLE TO FIND CPAP therapy    . carvedilol (COREG) 12.5 MG tablet Take 1.5 tablets in the morning and 1 tablet at night 75 tablet 6   No current facility-administered medications for this visit.    Socially he's married has 2 children he works in Development worker, international aid. There is a remote tobacco history but he quit in 1989.  ROS General: Negative; No fevers, chills, or night sweats; positive for obesity HEENT: Negative; No changes in vision or hearing, sinus congestion, difficulty swallowing Pulmonary: Negative; No cough, wheezing, shortness of breath, hemoptysis Cardiovascular: See history of present illness;  GI: Negative; No nausea, vomiting, diarrhea, or abdominal pain GU: Positive for GERD; No dysuria, hematuria, or difficulty voiding Musculoskeletal: Negative; no myalgias, joint pain, or weakness Hematologic/Oncology: Negative; no easy bruising, bleeding Endocrine: Negative; no heat/cold intolerance; no diabetes Neuro: Positive for history of migraine headaches; no changes in balance,  Skin: Negative; No rashes or skin lesions Psychiatric: Negative; No behavioral problems, depression Sleep: Positive for obstructive sleep apnea, now on CPAP therapy; No snoring, daytime sleepiness,  hypersomnolence, bruxism, restless legs, hypnogognic hallucinations, no cataplexy Other comprehensive 14 point system review is negative.  PE BP 124/80 mmHg  Pulse 68  Ht _0  (1.727 m)  Wt 270 lb 3.2 oz (122.562 kg)  BMI 41.09 kg/m2  Morbid obesity General: Alert, oriented, no distress.  Skin: normal turgor, no rashes HEENT: Normocephalic, atraumatic. Pupils round and reactive; sclera anicteric;no lid lag.  Nose without nasal septal hypertrophy Mouth/Parynx benign; Mallinpatti scale 3 Neck: Thick neck;No JVD, no carotid bruits with normal carotid upstrokes Lungs: clear to ausculatation and percussion; no wheezing or rales Chest wall: Nontender to palpation Heart: RRR, s1 s2 normal 1/6 sem; no diastolic murmur.  No rubs, thrills or heaves. Abdomen: soft, nontender; no hepatosplenomehaly, BS+; abdominal aorta nontender and not dilated by palpation. Back: No CVA tenderness Pulses 2+  Extremities: no clubbing cyanosis or edema, Homan's sign negative  Neurologic: grossly nonfocal Psychological normal affect and mood  ECG (independently read by me): Normal sinus rhythm at 68 with right bundle branch block and repolarization changes.  First-degree AV block.  No ectopy  October 2015 ECG (independently read by me): Normal sinus rhythm with right bundle branch block.  Borderline first-degree AV block with PR interval 206 ms.  No ectopy  Prior February 2015 ECG (independently read by me): Normal sinus rhythm at 66 beats per minute, right bundle branch block with repolarization changes, first-degree AV block.  ECG from 12/12/2012: NSR at 69, RBBB  LABS:  BMET  BMP Latest Ref Rng 05/01/2014 06/29/2013 05/03/2013  Glucose 70 - 99 mg/dL 101(H) 103(H) 93  BUN 6 - 23 mg/dL _1 Creatinine 0.50 - 1.35 mg/dL 0.86 0.81 0.87  Sodium 135 - 145 mEq/L 137 136(L) 134(L)  Potassium 3.5 - 5.3 mEq/L 5.0 4.2 4.7  Chloride 96 - 112 mEq/L 101 97 99  CO2 19 - 32 mEq/L _2 Calcium 8.4 - 10.5  mg/dL 9.5 8.8 9.2     Hepatic Function Panel   Hepatic Function Latest Ref Rng 05/01/2014 05/03/2013 11/21/2012  Total Protein 6.0 - 8.3 g/dL 6.6 6.7 7.1  Albumin 3.5 - 5.2 g/dL 4.2 4.1 4.5  AST 0 - 37 U/L _0 ALT 0 - 53 U/L _1 Alk Phosphatase 39 - 117 U/L 52 56 57  Total Bilirubin 0.2 - 1.2 mg/dL 0.5 0.5 0.6     CBC  CBC Latest Ref Rng 05/01/2014 06/29/2013 05/03/2013  WBC 4.0 - 10.5 K/uL 5.5 9.4 6.4  Hemoglobin 13.0 - 17.0 g/dL 14.1 12.6(L) 14.2  Hematocrit 39.0 - 52.0 % 41.1 37.2(L) 40.4  Platelets 150 - 400 K/uL 221 224 224     BNP    Component Value Date/Time   PROBNP 36.0 06/04/2010 2206    Lipid Panel     Component Value Date/Time   CHOL 155 05/01/2014 0819   CHOL 155 11/21/2012 1025   TRIG 48 05/01/2014 0819   TRIG 81 11/21/2012 1025   HDL 48 05/01/2014 0819   HDL 45 11/21/2012 1025   CHOLHDL 3.2 05/01/2014 0819   VLDL 10 05/01/2014 0819   LDLCALC 97 05/01/2014 0819   LDLCALC 94 11/21/2012 1025     RADIOLOGY: No results found.    ASSESSMENT AND PLAN: Mr. Vanzanten has known CAD and is status post intervention to his proximal and mid LAD as well as his RCA.  A repeat cardiac catheterization by Dr. Ellyn Hack did not demonstrate significant cardiac etiology to his chest pain. He was empirically started on nitrate therapy but this has resulted in frequent migraine headaches. It is possible that some of his pain may have been due to spasm versus esophageal etiology or musculoskeletal symptomatology.  Previously, I discontinued his nitrates due to his headache and started him on amlodipine 2.5 mg.  He denies any migraine headaches since that time.  Since I last saw him, on his increased dose of carvedilol.  He feels his palpitations have significantly improved but he still experiences acute occasional episodes.  I recommended further titration of his carvedilol to 18.75 mg in the morning but he will continue with 12.5 mg at night.  In light of his  hyperlipidemia.  I am further titrating his Crestor to 40 mg daily for target LDL less than 70.  He continues to take Lasix 40 mg.  There is no edema.  His blood pressure today is controlled on current therapy.  We discussed the importance of weight loss with his morbid obesity and exercise at least 5 days per week for 30 minutes of moderate intensity.  He continues to use CPAP with 100% compliance.  Repeat blood work will be obtained in 3 months.  I will see him in 6 months for cardiology reevaluation.  Troy Sine, MD, [facility]  09/21/2014 10:14 AM

## 2014-09-23 ENCOUNTER — Ambulatory Visit: Payer: Self-pay | Admitting: Cardiovascular Disease

## 2014-09-24 ENCOUNTER — Encounter: Payer: Self-pay | Admitting: Podiatry

## 2014-09-24 ENCOUNTER — Ambulatory Visit (INDEPENDENT_AMBULATORY_CARE_PROVIDER_SITE_OTHER): Payer: 59 | Admitting: Family Medicine

## 2014-09-24 ENCOUNTER — Encounter: Payer: Self-pay | Admitting: Family Medicine

## 2014-09-24 ENCOUNTER — Ambulatory Visit (INDEPENDENT_AMBULATORY_CARE_PROVIDER_SITE_OTHER): Payer: 59 | Admitting: Podiatry

## 2014-09-24 VITALS — BP 120/78 | HR 78 | Temp 98.0°F | Resp 18 | Ht 68.0 in | Wt 272.0 lb

## 2014-09-24 DIAGNOSIS — Q828 Other specified congenital malformations of skin: Secondary | ICD-10-CM | POA: Diagnosis not present

## 2014-09-24 DIAGNOSIS — S83241A Other tear of medial meniscus, current injury, right knee, initial encounter: Secondary | ICD-10-CM

## 2014-09-24 NOTE — Progress Notes (Signed)
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 He states that they were doing great and has grown back his been ready to have them removed since December.  Objective: Vital signs are stable he is alert and oriented 3 pulses remain palpable. Porokeratotic lesion sub-fifth metatarsal bilateral is present. No signs of infection.  Assessment: Pain in limb secondary porokeratosis plantar aspect fifth metatarsal bilateral.  Plan: Debrided porokeratotic lesions 2 today follow-up with him in 3 months.

## 2014-09-24 NOTE — Progress Notes (Signed)
Subjective:    Patient ID: Lee [name], male    DOB: Dec 01, 1950, 64 y.o.   MRN: [id number]  HPI 09/03/14 Patient presents with 2 weeks of worsening knee pain in his right knee. He hurts in the posterior medial knee joint as well as the posterior lateral knee joint. On examination today there is no laxity to varus or valgus stress. He has a negative anterior and posterior drawer sign. However Apley grind test elicit significant pain in his medial meniscal area. He also has some mild tenderness to palpation in the joint lines bilaterally. The patient denies any specific injury. He does have a history of a meniscal tear in that same knee. The pain began gradually while he was walking 2 weeks ago.  At that time, my plan was:  I recommended conservative management initially. Offer the patient a cortisone injection but he declined today. He would like to try pain medication and tincture of time. If not improving over the next 2-3 weeks with time and rest ice and pain medication, he will return for cortisone injection. If no better after the cortisone injection, I would proceed with an MRI to evaluate further  09/24/14 The patient's pain is better but he continues to have pain over the medial aspect of his knee. The pain is mainly over the joint line. On examination today there is no laxity to varus or valgus stress. He has a negative anterior and posterior drawer sign. He continues to have pain in the medial aspect of the knee joint on Apley grind. He is failing conservative therapy and would like to proceed with cortisone injection today Past Medical History  Diagnosis Date  . Arthritis   . Hyperlipidemia   . Hypertension   . COPD (chronic obstructive pulmonary disease)   . PVC's (premature ventricular contractions)   . Paroxysmal atrial fibrillation     s/p afib ablation 10/22/08  . CAD (coronary artery disease)     PCI RCA and LAD 1/12  . DJD (degenerative joint disease)   . Migraines   .  Iron deficiency anemia   . Esophagitis 1991    grade 1  . Diverticulosis   . Hemorrhoids   . GERD (gastroesophageal reflux disease)     Hiatal hernia  . Allergic rhinitis     chronic sinusitis  . Cataract     has had lasik surg  . History of stress test 07/2012    normal prefusion and function with out scar or ischemia  . Sleep apnea     Questionalble: RDI during the total sleep time 6h 28 mins was 3.55/hr during REM sleep was at 5.71/hr. Supine AHI was 5.93/hr.   Past Surgical History  Procedure Laterality Date  . Lasik    . Nasal sinus surgery  2001  . Coronary angioplasty with stent placement      3 stents/ 4 caths  . Knee arthroscopy      right  . Neck surgery      Cervical fusion  . Shoulder open rotator cuff repair      right  . Rotator cuff repair      left  . Wrist arthrocentesis      left  . Spine surgery    . Radiofrequency ablation  May 2010    atrial fibrillation  . Eye surgery    . Cardiac catheterization  05/2010    The proximal LAD was then predilated with 2.0 x 12 trek. this was then stented with  a 2.5 x 16 promus Element drug-eluting stent at 14 atmosphere and prostdilated with 2.75 x 12  trek at 16 atmospheres(2.8 mm) resulting the reduction of 80% proximal LAD stenosis to 0% residual with excellent flow.  . Cardiac catheterization  06/05/2010    Successful percutaneous coronary intervention to the right coronary artery with percutaneous transluminal coronary angioplasty/stenting and insertion 3.0 x 16 mm Promus DES post dilated to 3.35 mm with stenosis being reduced to 0%  . Cardiac catheterization  02/2008    Post dilatation was performed using a 2.75 x 9  sprinter, 10 atmospheres for 40 seconds and then 9 atmosphere for 35 sec. This resulted in the 80% area of narrowing pre-intervention, now appearing to normal. There was no edvidence of the dissection or thrombus and there was TIMI III flow pre and post.  . Cardiac catheterization  02/2006  . Left  heart catheterization with coronary angiogram N/A 06/29/2013    Procedure: LEFT HEART CATHETERIZATION WITH CORONARY ANGIOGRAM;  Surgeon: Leonie Man, MD;  Location: [facility] CATH LAB;  Service: Cardiovascular;  Laterality: N/A;   Current Outpatient Prescriptions on File Prior to Visit  Medication Sig Dispense Refill  . amLODipine (NORVASC) 2.5 MG tablet Take 1 tablet (2.5 mg total) by mouth daily. 30 tablet 8  . aspirin 81 MG tablet Take 81 mg by mouth daily.    . carvedilol (COREG) 12.5 MG tablet Take 1.5 tablets in the morning and 1 tablet at night 75 tablet 6  . cholecalciferol (VITAMIN D) 1000 UNITS tablet Take 1,000 Units by mouth daily.    . citalopram (CELEXA) 40 MG tablet TAKE 1 TABLET BY MOUTH DAILY 30 tablet 6  . clopidogrel (PLAVIX) 75 MG tablet TAKE 1 TABLET BY MOUTH EVERY DAY 30 tablet 6  . Coenzyme Q10 (CO Q 10) 100 MG CAPS Take 1 capsule by mouth daily.    . fluticasone (FLONASE) 50 MCG/ACT nasal spray INSTILL 2 SPRAYS IN THE NOSE AT BEDTIME 16 g 11  . furosemide (LASIX) 40 MG tablet TAKE 1 TABLET BY MOUTH EVERY DAY 30 tablet 5  . nitroGLYCERIN (NITROSTAT) 0.4 MG SL tablet Place 1 tablet (0.4 mg total) under the tongue every 5 (five) minutes as needed (MAX 3 TABLETS). Chest pain 25 tablet 3  . oxyCODONE-acetaminophen (PERCOCET) 10-325 MG per tablet Take 1 tablet by mouth every 6 (six) hours as needed for pain. 30 tablet 0  . pantoprazole (PROTONIX) 40 MG tablet TAKE 1 TABLET BY MOUTH EVERY DAY 30 tablet 11  . potassium chloride (MICRO-K) 10 MEQ CR capsule TAKE ONE CAPSULE BY MOUTH EVERY DAY 30 capsule 5  . rosuvastatin (CRESTOR) 20 MG tablet Take 2 tablets (40 mg total) by mouth daily. 60 tablet 6  . telmisartan (MICARDIS) 40 MG tablet TAKE 1 TABLET BY MOUTH EVERY DAY 30 tablet 5  . triazolam (HALCION) 0.25 MG tablet Take 1 tablet (0.25 mg total) by mouth at bedtime as needed for sleep. 30 tablet 2  . UNABLE TO FIND CPAP therapy     No current facility-administered medications on  file prior to visit.   Allergies  Allergen Reactions  . Ibuprofen Swelling  . Other Shortness Of Breath    BETA BLOCKERS  . Toprol Xl [Metoprolol Succinate] Other (See Comments)    Personality change   History   Social History  . Marital Status: Married    Spouse Name: N/A  . Number of Children: 2  . Years of Education: N/A   Occupational History  .  retired    Social History Main Topics  . Smoking status: Former Smoker    Quit date: 06/01/1987  . Smokeless tobacco: Never Used  . Alcohol Use: 0.0 oz/week    0 Standard drinks or equivalent per week     Comment: once every 6-7 months  . Drug Use: No  . Sexual Activity: Not on file   Other Topics Concern  . Not on file   Social History Narrative      Review of Systems  All other systems reviewed and are negative.      Objective:   Physical Exam  Cardiovascular: Normal rate, regular rhythm and normal heart sounds.   Pulmonary/Chest: Effort normal and breath sounds normal.  Musculoskeletal:       Right knee: He exhibits decreased range of motion, effusion and abnormal meniscus. He exhibits no LCL laxity, normal patellar mobility and no MCL laxity. Tenderness found. Medial joint line and lateral joint line tenderness noted. No MCL and no LCL tenderness noted.  Vitals reviewed.         Assessment & Plan:  Acute medial meniscal tear, right, initial encounter  Using sterile technique, the right knee was injected with a mixture of 2 mL of 0.1% lidocaine, 2 mL of Marcaine, and 2 mL of 40 mg per mL Kenalog. Patient tolerated the procedure well without complication. If the patient's knee pain does not improve over the next 2 weeks, I would order an MRI of the knee to evaluate further. If it does confirm my initial suspicion which is a meniscal tear, I will schedule the patient to see an orthopedic surgeon. He would like to see Dr. Noemi Chapel if necessary.

## 2014-11-01 ENCOUNTER — Telehealth: Payer: Self-pay | Admitting: Family Medicine

## 2014-11-01 MED ORDER — OXYCODONE-ACETAMINOPHEN 10-325 MG PO TABS: 1.0000 | ORAL_TABLET | Freq: Four times a day (QID) | ORAL | Status: DC | PRN

## 2014-11-01 NOTE — Telephone Encounter (Signed)
ok 

## 2014-11-01 NOTE — Telephone Encounter (Signed)
rx printed ready for provider signature

## 2014-11-01 NOTE — Telephone Encounter (Signed)
?   Ok to refill  lov 09/24/14  lrf 08/29/14

## 2014-11-01 NOTE — Telephone Encounter (Signed)
[phone] Pt is needing a refill on oxyCODONE-acetaminophen (PERCOCET) 10-325 MG per tablet

## 2014-11-06 ENCOUNTER — Other Ambulatory Visit: Payer: Self-pay | Admitting: Family Medicine

## 2014-11-06 NOTE — Telephone Encounter (Signed)
?   OK to Refill  

## 2014-11-06 NOTE — Telephone Encounter (Signed)
Patient calling for refill of triazolam [phone]

## 2014-11-07 MED ORDER — TRIAZOLAM 0.25 MG PO TABS: 0.2500 mg | ORAL_TABLET | Freq: Every evening | ORAL | Status: DC | PRN

## 2014-11-07 NOTE — Telephone Encounter (Signed)
ok 

## 2014-11-07 NOTE — Telephone Encounter (Signed)
Medication called to pharmacy. 

## 2014-11-28 ENCOUNTER — Ambulatory Visit (INDEPENDENT_AMBULATORY_CARE_PROVIDER_SITE_OTHER): Payer: 59 | Admitting: Physician Assistant

## 2014-11-28 ENCOUNTER — Encounter: Payer: Self-pay | Admitting: Physician Assistant

## 2014-11-28 VITALS — BP 130/72 | HR 72 | Temp 98.0°F | Resp 18 | Ht 68.0 in | Wt 274.0 lb

## 2014-11-28 DIAGNOSIS — B9689 Other specified bacterial agents as the cause of diseases classified elsewhere: Principal | ICD-10-CM

## 2014-11-28 DIAGNOSIS — J029 Acute pharyngitis, unspecified: Secondary | ICD-10-CM | POA: Diagnosis not present

## 2014-11-28 DIAGNOSIS — J988 Other specified respiratory disorders: Secondary | ICD-10-CM | POA: Diagnosis not present

## 2014-11-28 MED ORDER — AZITHROMYCIN 250 MG PO TABS: ORAL_TABLET | ORAL | Status: DC

## 2014-11-28 NOTE — Progress Notes (Signed)
Patient ID: Lee [name] MRN: [id number], DOB: 1951/05/26, 64 y.o. Date of Encounter: 11/28/2014, 12:26 PM    Chief Complaint:  Chief Complaint  Patient presents with  . Chest congestion, cough, ST     HPI: 64 y.o. year old white male reports that symptoms started Sunday which was 5 days ago. Says that it started with chest congestion and cough. Developed scratchy throat. Then has moved up to his head. States that he still has the chest congestion and cough. States that now he has a bad sore throat. Now also with head and nasal congestion. Has had no fevers or chills.     Home Meds:   Outpatient Prescriptions Prior to Visit  Medication Sig Dispense Refill  . amLODipine (NORVASC) 2.5 MG tablet Take 1 tablet (2.5 mg total) by mouth daily. 30 tablet 8  . aspirin 81 MG tablet Take 81 mg by mouth daily.    . carvedilol (COREG) 12.5 MG tablet Take 1.5 tablets in the morning and 1 tablet at night 75 tablet 6  . cholecalciferol (VITAMIN D) 1000 UNITS tablet Take 1,000 Units by mouth daily.    . citalopram (CELEXA) 40 MG tablet TAKE 1 TABLET BY MOUTH DAILY 30 tablet 6  . clopidogrel (PLAVIX) 75 MG tablet TAKE 1 TABLET BY MOUTH EVERY DAY 30 tablet 6  . Coenzyme Q10 (CO Q 10) 100 MG CAPS Take 1 capsule by mouth daily.    . fluticasone (FLONASE) 50 MCG/ACT nasal spray INSTILL 2 SPRAYS IN THE NOSE AT BEDTIME 16 g 11  . furosemide (LASIX) 40 MG tablet TAKE 1 TABLET BY MOUTH EVERY DAY 30 tablet 5  . nitroGLYCERIN (NITROSTAT) 0.4 MG SL tablet Place 1 tablet (0.4 mg total) under the tongue every 5 (five) minutes as needed (MAX 3 TABLETS). Chest pain 25 tablet 3  . oxyCODONE-acetaminophen (PERCOCET) 10-325 MG per tablet Take 1 tablet by mouth every 6 (six) hours as needed for pain. 30 tablet 0  . pantoprazole (PROTONIX) 40 MG tablet TAKE 1 TABLET BY MOUTH EVERY DAY 30 tablet 11  . potassium chloride (MICRO-K) 10 MEQ CR capsule TAKE ONE CAPSULE BY MOUTH EVERY DAY 30 capsule 5  . rosuvastatin  (CRESTOR) 20 MG tablet Take 2 tablets (40 mg total) by mouth daily. 60 tablet 6  . telmisartan (MICARDIS) 40 MG tablet TAKE 1 TABLET BY MOUTH EVERY DAY 30 tablet 5  . triazolam (HALCION) 0.25 MG tablet Take 1 tablet (0.25 mg total) by mouth at bedtime as needed for sleep. 30 tablet 2  . UNABLE TO FIND CPAP therapy     No facility-administered medications prior to visit.    Allergies:  Allergies  Allergen Reactions  . Ibuprofen Swelling  . Other Shortness Of Breath    BETA BLOCKERS  . Toprol Xl [Metoprolol Succinate] Other (See Comments)    Personality change      Review of Systems: See HPI for pertinent ROS. All other ROS negative.    Physical Exam: Blood pressure 130/72, pulse 72, temperature 98 F (36.7 C), temperature source Oral, resp. rate 18, height 5\' 8"  (1.727 m), weight 274 lb (124.286 kg)., Body mass index is 41.67 kg/(m^2). General:  Overweight white male. Appears in no acute distress. HEENT: Normocephalic, atraumatic, eyes without discharge, sclera non-icteric, nares are without discharge. Bilateral auditory canals clear, TM's are without perforation, pearly grey and translucent with reflective cone of light bilaterally. Oral cavity moist, posterior pharynx with mild erythema but is without exudate, peritonsillar abscess.  Neck: Supple. No thyromegaly. He reports some tenderness with palpation of bilateral tonsillar nodes but I am unable to palpate any enlarged nodes. Lungs: Clear bilaterally to auscultation without wheezes, rales, or rhonchi. Breathing is unlabored. Heart: Regular rhythm. No murmurs, rubs, or gallops. Msk:  Strength and tone normal for age. Extremities/Skin: Warm and dry.  No rashes. Neuro: Alert and oriented X 3. Moves all extremities spontaneously. Gait is normal. CNII-XII grossly in tact. Psych:  Responds to questions appropriately with a normal affect.     ASSESSMENT AND PLAN:  64 y.o. year old male with  1. Bacterial respiratory infection -  azithromycin (ZITHROMAX) 250 MG tablet; Take 2 daily for 5 days.  Dispense: 10 tablet; Refill: 0  2. Acute pharyngitis, unspecified pharyngitis type - azithromycin (ZITHROMAX) 250 MG tablet; Take 2 daily for 5 days.  Dispense: 10 tablet; Refill: 0  Will prescribe azithromycin at a dose to cover upper respiratory and lower respiratory infection as well as cover for possible strep. He is to take anti-biotic as directed and complete all of it. Follow-up if symptoms worsen significantly in the interim or do not resolve within 1 week after completion of antibiotic.  May use over-the-counter medications for symptom relief in the interim,  if needed.  [address] [place], [place], [facility] 11/28/2014 12:26 PM

## 2014-12-05 ENCOUNTER — Ambulatory Visit: Payer: 59 | Admitting: Family Medicine

## 2015-01-09 ENCOUNTER — Other Ambulatory Visit: Payer: Self-pay | Admitting: Cardiovascular Disease

## 2015-01-10 NOTE — Telephone Encounter (Signed)
Rx(s) sent to pharmacy electronically.  

## 2015-01-13 ENCOUNTER — Other Ambulatory Visit: Payer: Self-pay | Admitting: Cardiovascular Disease

## 2015-01-13 NOTE — Telephone Encounter (Signed)
Rx request sent to pharmacy.  

## 2015-01-23 ENCOUNTER — Ambulatory Visit: Payer: 59 | Admitting: Podiatry

## 2015-02-04 ENCOUNTER — Telehealth: Payer: Self-pay | Admitting: *Deleted

## 2015-02-04 NOTE — Telephone Encounter (Signed)
ok 

## 2015-02-04 NOTE — Telephone Encounter (Signed)
Pt called stating needing refills on his oxycodone 10-325mg  pt states he is completely out.  Lov 11/28/14  Lrf: 11/01/14  Call back number [phone]

## 2015-02-05 MED ORDER — OXYCODONE-ACETAMINOPHEN 10-325 MG PO TABS: 1.0000 | ORAL_TABLET | Freq: Four times a day (QID) | ORAL | Status: DC | PRN

## 2015-02-05 NOTE — Telephone Encounter (Signed)
Script printed ready for provider signature 

## 2015-02-06 NOTE — Telephone Encounter (Signed)
Call placed to patient and patient made aware that prescription is available for pickup.

## 2015-02-11 ENCOUNTER — Other Ambulatory Visit: Payer: Self-pay | Admitting: Cardiovascular Disease

## 2015-03-09 ENCOUNTER — Other Ambulatory Visit: Payer: Self-pay | Admitting: Family Medicine

## 2015-03-10 NOTE — Telephone Encounter (Signed)
ok 

## 2015-03-10 NOTE — Telephone Encounter (Signed)
?   Ok to refill  Last ov 11/28/14  Last rf: 01/22/15

## 2015-03-12 NOTE — Telephone Encounter (Signed)
Script called in to pharmacy  

## 2015-04-03 ENCOUNTER — Other Ambulatory Visit: Payer: Self-pay | Admitting: Cardiovascular Disease

## 2015-04-07 ENCOUNTER — Other Ambulatory Visit: Payer: Self-pay | Admitting: Cardiovascular Disease

## 2015-04-14 ENCOUNTER — Telehealth: Payer: Self-pay | Admitting: Family Medicine

## 2015-04-14 MED ORDER — OXYCODONE-ACETAMINOPHEN 10-325 MG PO TABS: 1.0000 | ORAL_TABLET | Freq: Four times a day (QID) | ORAL | Status: DC | PRN

## 2015-04-14 NOTE — Telephone Encounter (Signed)
Approved to print one Rx for same quantity as on last Rx.

## 2015-04-14 NOTE — Telephone Encounter (Signed)
?   OK to Refill  Last RF - 02/05/15

## 2015-04-14 NOTE — Telephone Encounter (Signed)
RX printed, left up front and patient aware to pick up  

## 2015-04-14 NOTE — Telephone Encounter (Signed)
PHARMACY: SELF PICK UP   MEDICATION: OXYCODONE   QTY:    SIG:    PHYSICIAN: PICKARD   PT. PHONE #: [phone]

## 2015-04-29 ENCOUNTER — Other Ambulatory Visit: Payer: Self-pay | Admitting: Family Medicine

## 2015-05-27 ENCOUNTER — Other Ambulatory Visit: Payer: 59

## 2015-05-27 ENCOUNTER — Other Ambulatory Visit: Payer: Self-pay | Admitting: Family Medicine

## 2015-05-27 DIAGNOSIS — E785 Hyperlipidemia, unspecified: Secondary | ICD-10-CM

## 2015-05-27 DIAGNOSIS — Z125 Encounter for screening for malignant neoplasm of prostate: Secondary | ICD-10-CM

## 2015-05-27 DIAGNOSIS — Z79899 Other long term (current) drug therapy: Secondary | ICD-10-CM

## 2015-05-27 DIAGNOSIS — Z0000 Encounter for general adult medical examination without abnormal findings: Secondary | ICD-10-CM

## 2015-05-27 DIAGNOSIS — I10 Essential (primary) hypertension: Secondary | ICD-10-CM

## 2015-05-27 LAB — COMPLETE METABOLIC PANEL WITH GFR
ALBUMIN: 3.9 g/dL (ref 3.6–5.1)
ALK PHOS: 52 U/L (ref 40–115)
ALT: 25 U/L (ref 9–46)
AST: 20 U/L (ref 10–35)
BILIRUBIN TOTAL: 0.6 mg/dL (ref 0.2–1.2)
BUN: 12 mg/dL (ref 7–25)
CALCIUM: 9.2 mg/dL (ref 8.6–10.3)
CO2: 27 mmol/L (ref 20–31)
CREATININE: 0.84 mg/dL (ref 0.70–1.25)
Chloride: 99 mmol/L (ref 98–110)
GFR, Est African American: >89 mL/min (ref >=60)
GFR, Est Non African American: >89 mL/min (ref >=60)
GLUCOSE: 95 mg/dL (ref 70–99)
Potassium: 4.3 mmol/L (ref 3.5–5.3)
SODIUM: 134 mmol/L — AB (ref 135–146)
TOTAL PROTEIN: 6.1 g/dL (ref 6.1–8.1)

## 2015-05-27 LAB — CBC WITH DIFFERENTIAL/PLATELET
BASOS PCT: 1 % (ref 0–1)
Basophils Absolute: 0.1 10*3/uL (ref 0.0–0.1)
EOS PCT: 2 % (ref 0–5)
Eosinophils Absolute: 0.1 10*3/uL (ref 0.0–0.7)
HEMATOCRIT: 40.6 % (ref 39.0–52.0)
Hemoglobin: 13.3 g/dL (ref 13.0–17.0)
Lymphocytes Relative: 25 % (ref 12–46)
Lymphs Abs: 1.8 10*3/uL (ref 0.7–4.0)
MCH: 28.9 pg (ref 26.0–34.0)
MCHC: 32.8 g/dL (ref 30.0–36.0)
MCV: 88.3 fL (ref 78.0–100.0)
MONO ABS: 0.7 10*3/uL (ref 0.1–1.0)
MONOS PCT: 10 % (ref 3–12)
MPV: 9 fL (ref 8.6–12.4)
NEUTROS ABS: 4.4 10*3/uL (ref 1.7–7.7)
NEUTROS PCT: 62 % (ref 43–77)
Platelets: 196 10*3/uL (ref 150–400)
RBC: 4.6 MIL/uL (ref 4.22–5.81)
RDW: 13.2 % (ref 11.5–15.5)
WBC: 7.1 10*3/uL (ref 4.0–10.5)

## 2015-05-27 LAB — LIPID PANEL
Cholesterol: 109 mg/dL — ABNORMAL LOW (ref 125–200)
HDL: 45 mg/dL (ref >=40)
LDL CALC: 53 mg/dL (ref <130)
Total CHOL/HDL Ratio: 2.4 Ratio (ref <=5.0)
Triglycerides: 56 mg/dL (ref <150)
VLDL: 11 mg/dL (ref <30)

## 2015-05-27 LAB — TSH: TSH: 1.407 u[IU]/mL (ref 0.350–4.500)

## 2015-05-28 LAB — PSA: PSA: 0.98 ng/mL (ref <=4.00)

## 2015-05-29 ENCOUNTER — Ambulatory Visit (INDEPENDENT_AMBULATORY_CARE_PROVIDER_SITE_OTHER): Payer: 59 | Admitting: Family Medicine

## 2015-05-29 ENCOUNTER — Encounter: Payer: Self-pay | Admitting: Family Medicine

## 2015-05-29 VITALS — BP 130/76 | HR 78 | Temp 98.3°F | Resp 16 | Ht 68.0 in | Wt 275.0 lb

## 2015-05-29 DIAGNOSIS — Z23 Encounter for immunization: Secondary | ICD-10-CM | POA: Diagnosis not present

## 2015-05-29 DIAGNOSIS — I2510 Atherosclerotic heart disease of native coronary artery without angina pectoris: Secondary | ICD-10-CM | POA: Diagnosis not present

## 2015-05-29 MED ORDER — ISOSORBIDE MONONITRATE ER 30 MG PO TB24: 30.0000 mg | ORAL_TABLET | Freq: Every day | ORAL | Status: DC

## 2015-05-29 NOTE — Progress Notes (Signed)
Subjective:    Patient ID: Lee [name], male    DOB: 01/28/1951, 64 y.o.   MRN: [id number]  HPI   Patient is a very pleasant 64 year old white male comes in today for complete physical exam. He has no major medical concerns. His last tetanus shot was in 2013. He had a Zostavax in 2013. He had Pneumovax 23 in 2006. He received his flu shot in October. He is due for Prevnar 13 now that he is 9. His last colonoscopy was 2012 and was normal.   He recently complains of daily headaches over the last 3-4 weeks. He has a history of migraines. In the past he has tried Topamax but was unable to tolerate the medication due to stomach irritation. Therefore he has been taking 2 Goody powders every day for the last 3 weeks. Recently developed left midaxillary chest pain. He also complained of some mild heaviness in the center of his chest. This  Resolve gradually over a period of 30 minutes after he took one nitroglycerin. He denies any angina. He has had no further chest pain at rest. He is walking every day with his wife without any chest pain or dyspnea on exertion. He had catheterization performed January 2015 which showed noncritical stenosis in the left anterior descending artery stent 20% proximally and 40% mid stent as well as a 40% stenosis in the right coronary artery stent. At the time it was decided to manage him medically with nitroglycerin which the patient cannot tolerate due to headaches.His most recent labwork as listed below: No visits with results within 1 Day(s) from this visit. Latest known visit with results is:  Appointment on 05/27/2015  Component Date Value Ref Range Status  . Sodium 05/27/2015 134* 135 - 146 mmol/L Final  . Potassium 05/27/2015 4.3  3.5 - 5.3 mmol/L Final  . Chloride 05/27/2015 99  98 - 110 mmol/L Final  . CO2 05/27/2015 27  20 - 31 mmol/L Final  . Glucose, Bld 05/27/2015 95  70 - 99 mg/dL Final  . BUN 05/27/2015 12  7 - 25 mg/dL Final  . Creat 05/27/2015  0.84  0.70 - 1.25 mg/dL Final  . Total Bilirubin 05/27/2015 0.6  0.2 - 1.2 mg/dL Final  . Alkaline Phosphatase 05/27/2015 52  40 - 115 U/L Final  . AST 05/27/2015 20  10 - 35 U/L Final  . ALT 05/27/2015 25  9 - 46 U/L Final  . Total Protein 05/27/2015 6.1  6.1 - 8.1 g/dL Final  . Albumin 05/27/2015 3.9  3.6 - 5.1 g/dL Final  . Calcium 05/27/2015 9.2  8.6 - 10.3 mg/dL Final  . GFR, Est African American 05/27/2015 >89  >=60 mL/min Final  . GFR, Est Non African American 05/27/2015 >89  >=60 mL/min Final   Comment:   The estimated GFR is a calculation valid for adults (>=64 years old) that uses the CKD-EPI algorithm to adjust for age and sex. It is   not to be used for children, pregnant women, hospitalized patients,    patients on dialysis, or with rapidly changing kidney function. According to the NKDEP, eGFR >89 is normal, 60-89 shows mild impairment, 30-59 shows moderate impairment, 15-29 shows severe impairment and <15 is ESRD.     Marland Kitchen TSH 05/27/2015 1.407  0.350 - 4.500 uIU/mL Final  . Cholesterol 05/27/2015 109* 125 - 200 mg/dL Final  . Triglycerides 05/27/2015 56  <150 mg/dL Final  . HDL 05/27/2015 45  >=40 mg/dL Final  .  Total CHOL/HDL Ratio 05/27/2015 2.4  <=5.0 Ratio Final  . VLDL 05/27/2015 11  <30 mg/dL Final  . LDL Cholesterol 05/27/2015 53  <130 mg/dL Final   Comment:   Total Cholesterol/HDL Ratio:CHD Risk                        Coronary Heart Disease Risk Table                                        Men       Women          1/2 Average Risk              3.4        3.3              Average Risk              5.0        4.4           2X Average Risk              9.6        7.1           3X Average Risk             23.4       11.0 Use the calculated Patient Ratio above and the CHD Risk table  to determine the patient's CHD Risk.   . WBC 05/27/2015 7.1  4.0 - 10.5 K/uL Final  . RBC 05/27/2015 4.60  4.22 - 5.81 MIL/uL Final  . Hemoglobin 05/27/2015 13.3  13.0 - 17.0 g/dL  Final  . HCT 05/27/2015 40.6  39.0 - 52.0 % Final  . MCV 05/27/2015 88.3  78.0 - 100.0 fL Final  . MCH 05/27/2015 28.9  26.0 - 34.0 pg Final  . MCHC 05/27/2015 32.8  30.0 - 36.0 g/dL Final  . RDW 05/27/2015 13.2  11.5 - 15.5 % Final  . Platelets 05/27/2015 196  150 - 400 K/uL Final  . MPV 05/27/2015 9.0  8.6 - 12.4 fL Final  . Neutrophils Relative % 05/27/2015 62  43 - 77 % Final  . Neutro Abs 05/27/2015 4.4  1.7 - 7.7 K/uL Final  . Lymphocytes Relative 05/27/2015 25  12 - 46 % Final  . Lymphs Abs 05/27/2015 1.8  0.7 - 4.0 K/uL Final  . Monocytes Relative 05/27/2015 10  3 - 12 % Final  . Monocytes Absolute 05/27/2015 0.7  0.1 - 1.0 K/uL Final  . Eosinophils Relative 05/27/2015 2  0 - 5 % Final  . Eosinophils Absolute 05/27/2015 0.1  0.0 - 0.7 K/uL Final  . Basophils Relative 05/27/2015 1  0 - 1 % Final  . Basophils Absolute 05/27/2015 0.1  0.0 - 0.1 K/uL Final  . Smear Review 05/27/2015 Criteria for review not met   Final  . PSA 05/27/2015 0.98  <=4.00 ng/mL Final   Comment: Test Methodology: ECLIA PSA (Electrochemiluminescence Immunoassay)   For PSA values from 2.5-4.0, particularly in younger men <18 years old, the AUA and NCCN suggest testing for % Free PSA (3515) and evaluation of the rate of increase in PSA (PSA velocity).    Past Medical History  Diagnosis Date  . Arthritis   . Hyperlipidemia   . Hypertension   . COPD (chronic obstructive pulmonary disease)   .  PVC's (premature ventricular contractions)   . Paroxysmal atrial fibrillation     s/p afib ablation 10/22/08  . CAD (coronary artery disease)     PCI RCA and LAD 1/12  . DJD (degenerative joint disease)   . Migraines   . Iron deficiency anemia   . Esophagitis 1991    grade 1  . Diverticulosis   . Hemorrhoids   . GERD (gastroesophageal reflux disease)     Hiatal hernia  . Allergic rhinitis     chronic sinusitis  . Cataract     has had lasik surg  . History of stress test 07/2012    normal prefusion and  function with out scar or ischemia  . Sleep apnea     Questionalble: RDI during the total sleep time 6h 28 mins was 3.55/hr during REM sleep was at 5.71/hr. Supine AHI was 5.93/hr.   Current Outpatient Prescriptions on File Prior to Visit  Medication Sig Dispense Refill  . amLODipine (NORVASC) 2.5 MG tablet Take 1 tablet (2.5 mg total) by mouth daily. 30 tablet 8  . aspirin 81 MG tablet Take 81 mg by mouth daily.    Marland Kitchen azithromycin (ZITHROMAX) 250 MG tablet Take 2 daily for 5 days. 10 tablet 0  . carvedilol (COREG) 12.5 MG tablet TAKE 1.5 TABLETS BY MOUTH IN THE MORNING AND 1 TABLET AT NIGHT 75 tablet 1  . cholecalciferol (VITAMIN D) 1000 UNITS tablet Take 1,000 Units by mouth daily.    . citalopram (CELEXA) 40 MG tablet TAKE 1 TABLET BY MOUTH DAILY 30 tablet 0  . clopidogrel (PLAVIX) 75 MG tablet TAKE 1 TABLET BY MOUTH EVERY DAY 30 tablet 8  . Coenzyme Q10 (CO Q 10) 100 MG CAPS Take 1 capsule by mouth daily.    . fluticasone (FLONASE) 50 MCG/ACT nasal spray INSTILL 2 SPRAYS IN THE NOSE AT BEDTIME 16 g 5  . furosemide (LASIX) 40 MG tablet TAKE 1 TABLET BY MOUTH EVERY DAY 30 tablet 6  . nitroGLYCERIN (NITROSTAT) 0.4 MG SL tablet Place 1 tablet (0.4 mg total) under the tongue every 5 (five) minutes as needed (MAX 3 TABLETS). Chest pain 25 tablet 3  . oxyCODONE-acetaminophen (PERCOCET) 10-325 MG tablet Take 1 tablet by mouth every 6 (six) hours as needed for pain. 30 tablet 0  . pantoprazole (PROTONIX) 40 MG tablet TAKE 1 TABLET BY MOUTH EVERY DAY 30 tablet 6  . potassium chloride (MICRO-K) 10 MEQ CR capsule TAKE 1 CAPSULE BY MOUTH EVERY DAY 30 capsule 6  . rosuvastatin (CRESTOR) 20 MG tablet TAKE 2 TABLETS(40 MG) BY MOUTH DAILY 60 tablet 5  . telmisartan (MICARDIS) 40 MG tablet TAKE 1 TABLET BY MOUTH DAILY 30 tablet 1  . triazolam (HALCION) 0.25 MG tablet Take 1 tablet (0.25 mg total) by mouth at bedtime as needed for sleep. 30 tablet 2  . UNABLE TO FIND CPAP therapy     No current  facility-administered medications on file prior to visit.   Allergies  Allergen Reactions  . Ibuprofen Swelling  . Other Shortness Of Breath    BETA BLOCKERS  . Toprol Xl [Metoprolol Succinate] Other (See Comments)    Personality change   Past Surgical History  Procedure Laterality Date  . Lasik    . Nasal sinus surgery  2001  . Coronary angioplasty with stent placement      3 stents/ 4 caths  . Knee arthroscopy      right  . Neck surgery      Cervical  fusion  . Shoulder open rotator cuff repair      right  . Rotator cuff repair      left  . Wrist arthrocentesis      left  . Spine surgery    . Radiofrequency ablation  May 2010    atrial fibrillation  . Eye surgery    . Cardiac catheterization  05/2010    The proximal LAD was then predilated with 2.0 x 12 trek. this was then stented with a 2.5 x 16 promus Element drug-eluting stent at 14 atmosphere and prostdilated with 2.75 x 12 [place] trek at 16 atmospheres(2.8 mm) resulting the reduction of 80% proximal LAD stenosis to 0% residual with excellent flow.  . Cardiac catheterization  06/05/2010    Successful percutaneous coronary intervention to the right coronary artery with percutaneous transluminal coronary angioplasty/stenting and insertion 3.0 x 16 mm Promus DES post dilated to 3.35 mm with stenosis being reduced to 0%  . Cardiac catheterization  02/2008    Post dilatation was performed using a 2.75 x 9 [place] sprinter, 10 atmospheres for 40 seconds and then 9 atmosphere for 35 sec. This resulted in the 80% area of narrowing pre-intervention, now appearing to normal. There was no edvidence of the dissection or thrombus and there was TIMI III flow pre and post.  . Cardiac catheterization  02/2006  . Left heart catheterization with coronary angiogram N/A 06/29/2013    Procedure: LEFT HEART CATHETERIZATION WITH CORONARY ANGIOGRAM;  Surgeon: Leonie Man, MD;  Location: [facility] CATH LAB;  Service: Cardiovascular;  Laterality: N/A;   Social  History   Social History  . Marital Status: Married    Spouse Name: N/A  . Number of Children: 2  . Years of Education: N/A   Occupational History  . retired    Social History Main Topics  . Smoking status: Former Smoker    Quit date: 06/01/1987  . Smokeless tobacco: Never Used  . Alcohol Use: 0.0 oz/week    0 Standard drinks or equivalent per week     Comment: once every 6-7 months  . Drug Use: No  . Sexual Activity: Not on file   Other Topics Concern  . Not on file   Social History Narrative   Family History  Problem Relation Age of Onset  . Heart disease Father     and sister  . Diabetes Father   . Colon cancer Mother     mets from uterine  . Uterine cancer Mother   . Heart disease Sister      Review of Systems  All other systems reviewed and are negative.      Objective:   Physical Exam  Constitutional: He is oriented to person, place, and time. He appears well-developed and well-nourished.  HENT:  Head: Normocephalic and atraumatic.  Right Ear: External ear normal.  Left Ear: External ear normal.  Nose: Nose normal.  Mouth/Throat: Oropharynx is clear and moist. No oropharyngeal exudate.  Eyes: Conjunctivae and EOM are normal. Pupils are equal, round, and reactive to light. Right eye exhibits no discharge. Left eye exhibits no discharge. No scleral icterus.  Neck: Normal range of motion. Neck supple. No JVD present. No tracheal deviation present.  Cardiovascular: Normal rate, regular rhythm, normal heart sounds and intact distal pulses.  Exam reveals no gallop and no friction rub.   No murmur heard. Pulmonary/Chest: Effort normal and breath sounds normal. No stridor. No respiratory distress. He has no wheezes. He has no rales. He exhibits  no tenderness.  Abdominal: Soft. Bowel sounds are normal. He exhibits no distension and no mass. There is no tenderness. There is no rebound and no guarding.  Genitourinary: Rectum normal, prostate normal and penis  normal.  Musculoskeletal: Normal range of motion. He exhibits no edema or tenderness.  Lymphadenopathy:    He has no cervical adenopathy.  Neurological: He is alert and oriented to person, place, and time. He has normal reflexes. No cranial nerve deficit. He exhibits normal muscle tone. Coordination normal.  Skin: Skin is warm. No rash noted. He is not diaphoretic. No erythema. No pallor.  Psychiatric: He has a normal mood and affect. His behavior is normal. Judgment and thought content normal.  Vitals reviewed.         Assessment & Plan:  1. Routine general medical examination at a health care facility Physical exam is normal. Lab work is excellent. Cancer screening is up-to-date. Prevnar 13 was administered today. Chest pain is very atypical. He denies any exercise-induced chest pain. He denies any dyspnea on exertion or shortness of breath. I believe the chest wall pain may have been due to his over use of Goody powders over the last month. I recommended discontinuation of Goody powders. If the chest pain returns even off the [place] powders, I would have the patient begin Imdur 30 mg a day and see his cardiologist immediately. I will recheck the patient in 2 weeks or immediately if worse. If his headaches persist, we may want to revisit Topamax versus Depakote as a migraine preventative.

## 2015-05-29 NOTE — Addendum Note (Signed)
Addended by: Shary Decamp B on: 05/29/2015 03:46 PM   Modules accepted: Orders

## 2015-06-05 ENCOUNTER — Other Ambulatory Visit: Payer: Self-pay | Admitting: Cardiovascular Disease

## 2015-06-05 NOTE — Telephone Encounter (Signed)
REFILL 

## 2015-06-12 ENCOUNTER — Ambulatory Visit (INDEPENDENT_AMBULATORY_CARE_PROVIDER_SITE_OTHER): Payer: 59 | Admitting: Family Medicine

## 2015-06-12 ENCOUNTER — Encounter: Payer: Self-pay | Admitting: Family Medicine

## 2015-06-12 VITALS — BP 136/78 | HR 78 | Temp 98.4°F | Resp 18 | Ht 68.0 in | Wt 280.0 lb

## 2015-06-12 DIAGNOSIS — G43009 Migraine without aura, not intractable, without status migrainosus: Secondary | ICD-10-CM

## 2015-06-12 MED ORDER — OXYCODONE-ACETAMINOPHEN 10-325 MG PO TABS: 1.0000 | ORAL_TABLET | Freq: Four times a day (QID) | ORAL | Status: DC | PRN

## 2015-06-12 MED ORDER — TOPIRAMATE 25 MG PO TABS: 50.0000 mg | ORAL_TABLET | Freq: Two times a day (BID) | ORAL | Status: DC

## 2015-06-12 NOTE — Progress Notes (Signed)
Subjective:    Patient ID: Lee [name], male    DOB: [birth year], 65 y.o.   MRN: [id number]  HPI  05/29/15 Patient is a very pleasant 65 year old white male comes in today for complete physical exam. He has no major medical concerns. His last tetanus shot was in 2013. He had a Zostavax in 2013. He had Pneumovax 23 in 2006. He received his flu shot in October. He is due for Prevnar 13 now that he is 65. His last colonoscopy was 2012 and was normal.   He recently complains of daily headaches over the last 3-4 weeks. He has a history of migraines. In the past he has tried Topamax but was unable to tolerate the medication due to stomach irritation. Therefore he has been taking 2 Goody powders every day for the last 3 weeks. Recently developed left midaxillary chest pain. He also complained of some mild heaviness in the center of his chest. This  Resolve gradually over a period of 30 minutes after he took one nitroglycerin. He denies any angina. He has had no further chest pain at rest. He is walking every day with his wife without any chest pain or dyspnea on exertion. He had catheterization performed January 2015 which showed noncritical stenosis in the left anterior descending artery stent 20% proximally and 40% mid stent as well as a 40% stenosis in the right coronary artery stent. At the time it was decided to manage him medically with nitroglycerin which the patient cannot tolerate due to headaches.His most recent labwork as listed below: No visits with results within 1 Day(s) from this visit. Latest known visit with results is:  Appointment on 05/27/2015  Component Date Value Ref Range Status  . Sodium 05/27/2015 134* 135 - 146 mmol/L Final  . Potassium 05/27/2015 4.3  3.5 - 5.3 mmol/L Final  . Chloride 05/27/2015 99  98 - 110 mmol/L Final  . CO2 05/27/2015 27  20 - 31 mmol/L Final  . Glucose, Bld 05/27/2015 95  70 - 99 mg/dL Final  . BUN 05/27/2015 12  7 - 25 mg/dL Final  . Creat  05/27/2015 0.84  0.70 - 1.25 mg/dL Final  . Total Bilirubin 05/27/2015 0.6  0.2 - 1.2 mg/dL Final  . Alkaline Phosphatase 05/27/2015 52  40 - 115 U/L Final  . AST 05/27/2015 20  10 - 35 U/L Final  . ALT 05/27/2015 25  9 - 46 U/L Final  . Total Protein 05/27/2015 6.1  6.1 - 8.1 g/dL Final  . Albumin 05/27/2015 3.9  3.6 - 5.1 g/dL Final  . Calcium 05/27/2015 9.2  8.6 - 10.3 mg/dL Final  . GFR, Est African American 05/27/2015 >89  >=60 mL/min Final  . GFR, Est Non African American 05/27/2015 >89  >=60 mL/min Final   Comment:   The estimated GFR is a calculation valid for adults (>=61 years old) that uses the CKD-EPI algorithm to adjust for age and sex. It is   not to be used for children, pregnant women, hospitalized patients,    patients on dialysis, or with rapidly changing kidney function. According to the NKDEP, eGFR >89 is normal, 60-89 shows mild impairment, 30-59 shows moderate impairment, 15-29 shows severe impairment and <15 is ESRD.     Marland Kitchen TSH 05/27/2015 1.407  0.350 - 4.500 uIU/mL Final  . Cholesterol 05/27/2015 109* 125 - 200 mg/dL Final  . Triglycerides 05/27/2015 56  <150 mg/dL Final  . HDL 05/27/2015 45  >=40 mg/dL Final  .  Total CHOL/HDL Ratio 05/27/2015 2.4  <=5.0 Ratio Final  . VLDL 05/27/2015 11  <30 mg/dL Final  . LDL Cholesterol 05/27/2015 53  <130 mg/dL Final   Comment:   Total Cholesterol/HDL Ratio:CHD Risk                        Coronary Heart Disease Risk Table                                        Men       Women          1/2 Average Risk              3.4        3.3              Average Risk              5.0        4.4           2X Average Risk              9.6        7.1           3X Average Risk             23.4       11.0 Use the calculated Patient Ratio above and the CHD Risk table  to determine the patient's CHD Risk.   . WBC 05/27/2015 7.1  4.0 - 10.5 K/uL Final  . RBC 05/27/2015 4.60  4.22 - 5.81 MIL/uL Final  . Hemoglobin 05/27/2015 13.3  13.0 -  17.0 g/dL Final  . HCT 05/27/2015 40.6  39.0 - 52.0 % Final  . MCV 05/27/2015 88.3  78.0 - 100.0 fL Final  . MCH 05/27/2015 28.9  26.0 - 34.0 pg Final  . MCHC 05/27/2015 32.8  30.0 - 36.0 g/dL Final  . RDW 05/27/2015 13.2  11.5 - 15.5 % Final  . Platelets 05/27/2015 196  150 - 400 K/uL Final  . MPV 05/27/2015 9.0  8.6 - 12.4 fL Final  . Neutrophils Relative % 05/27/2015 62  43 - 77 % Final  . Neutro Abs 05/27/2015 4.4  1.7 - 7.7 K/uL Final  . Lymphocytes Relative 05/27/2015 25  12 - 46 % Final  . Lymphs Abs 05/27/2015 1.8  0.7 - 4.0 K/uL Final  . Monocytes Relative 05/27/2015 10  3 - 12 % Final  . Monocytes Absolute 05/27/2015 0.7  0.1 - 1.0 K/uL Final  . Eosinophils Relative 05/27/2015 2  0 - 5 % Final  . Eosinophils Absolute 05/27/2015 0.1  0.0 - 0.7 K/uL Final  . Basophils Relative 05/27/2015 1  0 - 1 % Final  . Basophils Absolute 05/27/2015 0.1  0.0 - 0.1 K/uL Final  . Smear Review 05/27/2015 Criteria for review not met   Final  . PSA 05/27/2015 0.98  <=4.00 ng/mL Final   Comment: Test Methodology: ECLIA PSA (Electrochemiluminescence Immunoassay)   For PSA values from 2.5-4.0, particularly in younger men <61 years old, the AUA and NCCN suggest testing for % Free PSA (3515) and evaluation of the rate of increase in PSA (PSA velocity).   At that time, my plan was: 1. Routine general medical examination at a health care facility Physical exam is normal. Lab work is excellent. Cancer screening is up-to-date. Prevnar  13 was administered today. Chest pain is very atypical. He denies any exercise-induced chest pain. He denies any dyspnea on exertion or shortness of breath. I believe the chest wall pain may have been due to his over use of Goody powders over the last month. I recommended discontinuation of Goody powders. If the chest pain returns even off the [place] powders, I would have the patient begin Imdur 30 mg a day and see his cardiologist immediately. I will recheck the patient in 2  weeks or immediately if worse. If his headaches persist, we may want to revisit Topamax versus Depakote as a migraine preventative.  06/12/15  since stopping the Goody powders,  The patient's chest pain has improved dramatically in essentially stopped. Unfortunately the headaches have become much worse. He never started the Imdur given the fact the chest pain went away and appears to be gastrointestinal in origin. However he is here today to discuss options to help prevent the migraine headaches that he is now experiencing almost on a daily basis. Past Medical History  Diagnosis Date  . Arthritis   . Hyperlipidemia   . Hypertension   . COPD (chronic obstructive pulmonary disease) ([place])   . PVC's (premature ventricular contractions)   . Paroxysmal atrial fibrillation ([place])     s/p afib ablation 10/22/08  . CAD (coronary artery disease)     PCI RCA and LAD 1/12  . DJD (degenerative joint disease)   . Migraines   . Iron deficiency anemia   . Esophagitis 1991    grade 1  . Diverticulosis   . Hemorrhoids   . GERD (gastroesophageal reflux disease)     Hiatal hernia  . Allergic rhinitis     chronic sinusitis  . Cataract     has had lasik surg  . History of stress test 07/2012    normal prefusion and function with out scar or ischemia  . Sleep apnea     Questionalble: RDI during the total sleep time 6h 28 mins was 3.55/hr during REM sleep was at 5.71/hr. Supine AHI was 5.93/hr.   Current Outpatient Prescriptions on File Prior to Visit  Medication Sig Dispense Refill  . amLODipine (NORVASC) 2.5 MG tablet Take 1 tablet (2.5 mg total) by mouth daily. Call the office to schedule an appointment 30 tablet 1  . aspirin 81 MG tablet Take 81 mg by mouth daily.    . carvedilol (COREG) 12.5 MG tablet TAKE 1 AND 1/2 TABLET BY MOUTH EVERY MORNING AND 1 AT BEDTIME 75 tablet 0  . cholecalciferol (VITAMIN D) 1000 UNITS tablet Take 1,000 Units by mouth daily.    . citalopram (CELEXA) 40 MG tablet TAKE 1  TABLET BY MOUTH DAILY 30 tablet 0  . clopidogrel (PLAVIX) 75 MG tablet TAKE 1 TABLET BY MOUTH EVERY DAY 30 tablet 8  . Coenzyme Q10 (CO Q 10) 100 MG CAPS Take 1 capsule by mouth daily.    . fluticasone (FLONASE) 50 MCG/ACT nasal spray INSTILL 2 SPRAYS IN THE NOSE AT BEDTIME 16 g 5  . furosemide (LASIX) 40 MG tablet TAKE 1 TABLET BY MOUTH EVERY DAY 30 tablet 6  . nitroGLYCERIN (NITROSTAT) 0.4 MG SL tablet Place 1 tablet (0.4 mg total) under the tongue every 5 (five) minutes as needed (MAX 3 TABLETS). Chest pain 25 tablet 3  . pantoprazole (PROTONIX) 40 MG tablet TAKE 1 TABLET BY MOUTH EVERY DAY 30 tablet 6  . potassium chloride (MICRO-K) 10 MEQ CR capsule TAKE 1 CAPSULE BY MOUTH  EVERY DAY 30 capsule 6  . rosuvastatin (CRESTOR) 20 MG tablet TAKE 2 TABLETS(40 MG) BY MOUTH DAILY 60 tablet 5  . telmisartan (MICARDIS) 40 MG tablet TAKE 1 TABLET BY MOUTH DAILY 30 tablet 0  . triazolam (HALCION) 0.25 MG tablet Take 1 tablet (0.25 mg total) by mouth at bedtime as needed for sleep. 30 tablet 2  . UNABLE TO FIND CPAP therapy    . isosorbide mononitrate (IMDUR) 30 MG 24 hr tablet Take 1 tablet (30 mg total) by mouth daily. (Patient not taking: Reported on 06/12/2015) 30 tablet 6   No current facility-administered medications on file prior to visit.   Allergies  Allergen Reactions  . Ibuprofen Swelling  . Other Shortness Of Breath    BETA BLOCKERS  . Toprol Xl [Metoprolol Succinate] Other (See Comments)    Personality change   Past Surgical History  Procedure Laterality Date  . Lasik    . Nasal sinus surgery  2001  . Coronary angioplasty with stent placement      3 stents/ 4 caths  . Knee arthroscopy      right  . Neck surgery      Cervical fusion  . Shoulder open rotator cuff repair      right  . Rotator cuff repair      left  . Wrist arthrocentesis      left  . Spine surgery    . Radiofrequency ablation  May 2010    atrial fibrillation  . Eye surgery    . Cardiac catheterization   05/2010    The proximal LAD was then predilated with 2.0 x 12 trek. this was then stented with a 2.5 x 16 promus Element drug-eluting stent at 14 atmosphere and prostdilated with 2.75 x 12 [place] trek at 16 atmospheres(2.8 mm) resulting the reduction of 80% proximal LAD stenosis to 0% residual with excellent flow.  . Cardiac catheterization  06/05/2010    Successful percutaneous coronary intervention to the right coronary artery with percutaneous transluminal coronary angioplasty/stenting and insertion 3.0 x 16 mm Promus DES post dilated to 3.35 mm with stenosis being reduced to 0%  . Cardiac catheterization  02/2008    Post dilatation was performed using a 2.75 x 9 [place] sprinter, 10 atmospheres for 40 seconds and then 9 atmosphere for 35 sec. This resulted in the 80% area of narrowing pre-intervention, now appearing to normal. There was no edvidence of the dissection or thrombus and there was TIMI III flow pre and post.  . Cardiac catheterization  02/2006  . Left heart catheterization with coronary angiogram N/A 06/29/2013    Procedure: LEFT HEART CATHETERIZATION WITH CORONARY ANGIOGRAM;  Surgeon: Leonie Man, MD;  Location: [facility] C[facility] LAB;  Service: Cardiovascular;  Laterality: N/A;   Social History   Social History  . Marital Status: Married    Spouse Name: N/A  . Number of Children: 2  . Years of Education: N/A   Occupational History  . retired    Social History Main Topics  . Smoking status: Former Smoker    Quit date: 06/01/1987  . Smokeless tobacco: Never Used  . Alcohol Use: 0.0 oz/week    0 Standard drinks or equivalent per week     Comment: once every 6-7 months  . Drug Use: No  . Sexual Activity: Not on file   Other Topics Concern  . Not on file   Social History Narrative   Family History  Problem Relation Age of Onset  . Heart  disease Father     and sister  . Diabetes Father   . Colon cancer Mother     mets from uterine  . Uterine cancer Mother   . Heart disease  Sister      Review of Systems  All other systems reviewed and are negative.      Objective:   Physical Exam  Constitutional: He is oriented to person, place, and time. He appears well-developed and well-nourished.  HENT:  Head: Normocephalic and atraumatic.  Right Ear: External ear normal.  Left Ear: External ear normal.  Nose: Nose normal.  Mouth/Throat: Oropharynx is clear and moist. No oropharyngeal exudate.  Eyes: Conjunctivae and EOM are normal. Pupils are equal, round, and reactive to light. Right eye exhibits no discharge. Left eye exhibits no discharge. No scleral icterus.  Neck: Normal range of motion. Neck supple. No JVD present. No tracheal deviation present.  Cardiovascular: Normal rate, regular rhythm, normal heart sounds and intact distal pulses.  Exam reveals no gallop and no friction rub.   No murmur heard. Pulmonary/Chest: Effort normal and breath sounds normal. No stridor. No respiratory distress. He has no wheezes. He has no rales. He exhibits no tenderness.  Abdominal: Soft. Bowel sounds are normal. He exhibits no distension and no mass. There is no tenderness. There is no rebound and no guarding.  Genitourinary: Rectum normal, prostate normal and penis normal.  Musculoskeletal: Normal range of motion. He exhibits no edema or tenderness.  Lymphadenopathy:    He has no cervical adenopathy.  Neurological: He is alert and oriented to person, place, and time. He has normal reflexes. No cranial nerve deficit. He exhibits normal muscle tone. Coordination normal.  Skin: Skin is warm. No rash noted. He is not diaphoretic. No erythema. No pallor.  Psychiatric: He has a normal mood and affect. His behavior is normal. Judgment and thought content normal.  Vitals reviewed.         Assessment & Plan:  Migraine without aura and without status migrainosus, not intractable - Plan: topiramate (TOPAMAX) 25 MG tablet, oxyCODONE-acetaminophen (PERCOCET) 10-325 MG tablet    begin Topamax 25 mg by mouth daily at bedtime. In one week increase to 25 mg by mouth twice a day. Increase by 25 mg every week until the patient is alternately on 50 mg by mouth twice a day in one month. Recheck to see if the migraine headaches are improving at that time. Meanwhile I'll refill the patient's oxycodone that he uses periodically for knee pain as well as his headache.

## 2015-06-30 ENCOUNTER — Other Ambulatory Visit: Payer: Self-pay | Admitting: Cardiovascular Disease

## 2015-06-30 NOTE — Telephone Encounter (Signed)
Rx request sent to pharmacy.  

## 2015-07-03 ENCOUNTER — Ambulatory Visit (INDEPENDENT_AMBULATORY_CARE_PROVIDER_SITE_OTHER): Payer: 59 | Admitting: Family Medicine

## 2015-07-03 ENCOUNTER — Encounter: Payer: Self-pay | Admitting: Family Medicine

## 2015-07-03 VITALS — BP 130/80 | HR 72 | Temp 98.1°F | Resp 18 | Wt 281.0 lb

## 2015-07-03 DIAGNOSIS — M25561 Pain in right knee: Secondary | ICD-10-CM | POA: Diagnosis not present

## 2015-07-03 NOTE — Progress Notes (Signed)
Subjective:    Patient ID: Lee [name], male    DOB: [birth year], 65 y.o.   MRN: [id number]  HPI 09/03/14 Patient presents with 2 weeks of worsening knee pain in his right knee. He hurts in the posterior medial knee joint as well as the posterior lateral knee joint. On examination today there is no laxity to varus or valgus stress. He has a negative anterior and posterior drawer sign. However Apley grind test elicit significant pain in his medial meniscal area. He also has some mild tenderness to palpation in the joint lines bilaterally. The patient denies any specific injury. He does have a history of a meniscal tear in that same knee. The pain began gradually while he was walking 2 weeks ago.  At that time, my plan was:  I recommended conservative management initially. Offer the patient a cortisone injection but he declined today. He would like to try pain medication and tincture of time. If not improving over the next 2-3 weeks with time and rest ice and pain medication, he will return for cortisone injection. If no better after the cortisone injection, I would proceed with an MRI to evaluate further  09/24/14 The patient's pain is better but he continues to have pain over the medial aspect of his knee. The pain is mainly over the joint line. On examination today there is no laxity to varus or valgus stress. He has a negative anterior and posterior drawer sign. He continues to have pain in the medial aspect of the knee joint on Apley grind. He is failing conservative therapy and would like to proceed with cortisone injection today  At that time, my plan was: Using sterile technique, the right knee was injected with a mixture of 2 mL of 0.1% lidocaine, 2 mL of Marcaine, and 2 mL of 40 mg per mL Kenalog. Patient tolerated the procedure well without complication. If the patient's knee pain does not improve over the next 2 weeks, I would order an MRI of the knee to evaluate further. If it does confirm  my initial suspicion which is a meniscal tear, I will schedule the patient to see an orthopedic surgeon. He would like to see Dr. Noemi Chapel if necessary.  07/03/15 Patient did relatively well since the last time I saw him. The cortisone shot really helped the knee pain he was having in his right knee. However over the last month or so, the pain has returned. It continues to be located over the posterior medial aspect of the right knee. He has a positive Apley grind today. He has no laxity to varus or valgus stress. He has a negative anterior posterior drawer sign. He does have some enemas to palpation over the medial joint line. There is a slight effusion in the knee joint. Patient states he has pain with walking particular going up and down steps. Furthermore he states the knee feels like it may give out on him at times. He denies any locking of the knee joint. Past Medical History  Diagnosis Date  . Arthritis   . Hyperlipidemia   . Hypertension   . COPD (chronic obstructive pulmonary disease) ([place])   . PVC's (premature ventricular contractions)   . Paroxysmal atrial fibrillation ([place])     s/p afib ablation 10/22/08  . CAD (coronary artery disease)     PCI RCA and LAD 1/12  . DJD (degenerative joint disease)   . Migraines   . Iron deficiency anemia   . Esophagitis 1991  grade 1  . Diverticulosis   . Hemorrhoids   . GERD (gastroesophageal reflux disease)     Hiatal hernia  . Allergic rhinitis     chronic sinusitis  . Cataract     has had lasik surg  . History of stress test 07/2012    normal prefusion and function with out scar or ischemia  . Sleep apnea     Questionalble: RDI during the total sleep time 6h 28 mins was 3.55/hr during REM sleep was at 5.71/hr. Supine AHI was 5.93/hr.   Past Surgical History  Procedure Laterality Date  . Lasik    . Nasal sinus surgery  2001  . Coronary angioplasty with stent placement      3 stents/ 4 caths  . Knee arthroscopy      right  . Neck  surgery      Cervical fusion  . Shoulder open rotator cuff repair      right  . Rotator cuff repair      left  . Wrist arthrocentesis      left  . Spine surgery    . Radiofrequency ablation  May 2010    atrial fibrillation  . Eye surgery    . Cardiac catheterization  05/2010    The proximal LAD was then predilated with 2.0 x 12 trek. this was then stented with a 2.5 x 16 promus Element drug-eluting stent at 14 atmosphere and prostdilated with 2.75 x 12 [place] trek at 16 atmospheres(2.8 mm) resulting the reduction of 80% proximal LAD stenosis to 0% residual with excellent flow.  . Cardiac catheterization  06/05/2010    Successful percutaneous coronary intervention to the right coronary artery with percutaneous transluminal coronary angioplasty/stenting and insertion 3.0 x 16 mm Promus DES post dilated to 3.35 mm with stenosis being reduced to 0%  . Cardiac catheterization  02/2008    Post dilatation was performed using a 2.75 x 9 [place] sprinter, 10 atmospheres for 40 seconds and then 9 atmosphere for 35 sec. This resulted in the 80% area of narrowing pre-intervention, now appearing to normal. There was no edvidence of the dissection or thrombus and there was TIMI III flow pre and post.  . Cardiac catheterization  02/2006  . Left heart catheterization with coronary angiogram N/A 06/29/2013    Procedure: LEFT HEART CATHETERIZATION WITH CORONARY ANGIOGRAM;  Surgeon: Leonie Man, MD;  Location: [facility] CATH LAB;  Service: Cardiovascular;  Laterality: N/A;   Current Outpatient Prescriptions on File Prior to Visit  Medication Sig Dispense Refill  . amLODipine (NORVASC) 2.5 MG tablet Take 1 tablet (2.5 mg total) by mouth daily. Call the office to schedule an appointment 30 tablet 1  . aspirin 81 MG tablet Take 81 mg by mouth daily.    . carvedilol (COREG) 12.5 MG tablet TAKE 1 AND 1/2 TABLET BY MOUTH EVERY MORNING AND 1 AT BEDTIME. Please schedule appointment for refills. 75 tablet 0  . cholecalciferol  (VITAMIN D) 1000 UNITS tablet Take 1,000 Units by mouth daily.    . citalopram (CELEXA) 40 MG tablet TAKE 1 TABLET BY MOUTH DAILY 30 tablet 0  . clopidogrel (PLAVIX) 75 MG tablet TAKE 1 TABLET BY MOUTH EVERY DAY 30 tablet 8  . Coenzyme Q10 (CO Q 10) 100 MG CAPS Take 1 capsule by mouth daily.    . fluticasone (FLONASE) 50 MCG/ACT nasal spray INSTILL 2 SPRAYS IN THE NOSE AT BEDTIME 16 g 5  . furosemide (LASIX) 40 MG tablet TAKE 1 TABLET BY  MOUTH EVERY DAY 30 tablet 6  . isosorbide mononitrate (IMDUR) 30 MG 24 hr tablet Take 1 tablet (30 mg total) by mouth daily. 30 tablet 6  . nitroGLYCERIN (NITROSTAT) 0.4 MG SL tablet Place 1 tablet (0.4 mg total) under the tongue every 5 (five) minutes as needed (MAX 3 TABLETS). Chest pain 25 tablet 3  . oxyCODONE-acetaminophen (PERCOCET) 10-325 MG tablet Take 1 tablet by mouth every 6 (six) hours as needed for pain. 30 tablet 0  . pantoprazole (PROTONIX) 40 MG tablet TAKE 1 TABLET BY MOUTH EVERY DAY 30 tablet 6  . potassium chloride (MICRO-K) 10 MEQ CR capsule TAKE 1 CAPSULE BY MOUTH EVERY DAY 30 capsule 6  . rosuvastatin (CRESTOR) 20 MG tablet TAKE 2 TABLETS(40 MG) BY MOUTH DAILY 60 tablet 5  . telmisartan (MICARDIS) 40 MG tablet Take 1 tablet (40 mg total) by mouth daily. Please schedule appointment for refills. 30 tablet 0  . topiramate (TOPAMAX) 25 MG tablet Take 2 tablets (50 mg total) by mouth 2 (two) times daily. 120 tablet 3  . triazolam (HALCION) 0.25 MG tablet Take 1 tablet (0.25 mg total) by mouth at bedtime as needed for sleep. 30 tablet 2  . UNABLE TO FIND CPAP therapy     No current facility-administered medications on file prior to visit.   Allergies  Allergen Reactions  . Ibuprofen Swelling  . Other Shortness Of Breath    BETA BLOCKERS  . Toprol Xl [Metoprolol Succinate] Other (See Comments)    Personality change   Social History   Social History  . Marital Status: Married    Spouse Name: N/A  . Number of Children: 2  . Years of  Education: N/A   Occupational History  . retired    Social History Main Topics  . Smoking status: Former Smoker    Quit date: 06/01/1987  . Smokeless tobacco: Never Used  . Alcohol Use: 0.0 oz/week    0 Standard drinks or equivalent per week     Comment: once every 6-7 months  . Drug Use: No  . Sexual Activity: Not on file   Other Topics Concern  . Not on file   Social History Narrative      Review of Systems  All other systems reviewed and are negative.      Objective:   Physical Exam  Cardiovascular: Normal rate, regular rhythm and normal heart sounds.   Pulmonary/Chest: Effort normal and breath sounds normal.  Musculoskeletal:       Right knee: He exhibits decreased range of motion, effusion and abnormal meniscus. He exhibits no LCL laxity, normal patellar mobility and no MCL laxity. Tenderness found. Medial joint line and lateral joint line tenderness noted. No MCL and no LCL tenderness noted.  Vitals reviewed.         Assessment & Plan:  Right medial knee pain I suspect a meniscal tear with underlying osteoarthritis. Using sterile technique, I injected the right knee with a mixture of 2 mL of lidocaine, 2 mL of Marcaine, and 2 mL of 40 mg per mL Kenalog. The patient tolerated the procedure well with no complication. If the pain does not improve at this point, I would recommend an MRI to confirm suspicions and likely orthopedic consultation.Marland Kitchen

## 2015-07-08 ENCOUNTER — Encounter: Payer: Self-pay | Admitting: Podiatry

## 2015-07-08 ENCOUNTER — Ambulatory Visit (INDEPENDENT_AMBULATORY_CARE_PROVIDER_SITE_OTHER): Payer: 59 | Admitting: Podiatry

## 2015-07-08 DIAGNOSIS — Q828 Other specified congenital malformations of skin: Secondary | ICD-10-CM | POA: Diagnosis not present

## 2015-07-08 NOTE — Progress Notes (Signed)
He presents today with chief complaint of painful porokeratosis of fifth metatarsophalangeal joint bilaterally.  Objective: Pulses are strongly palpable. Porokeratosis of fifth bilateral.  Assessment: Porokeratosis bilateral foot.  Plan: Mechanical and chemical debridement with salicylic acid under occlusion to be left on for 3 days follow-up with me as needed.

## 2015-07-11 ENCOUNTER — Other Ambulatory Visit: Payer: Self-pay | Admitting: Family Medicine

## 2015-07-21 ENCOUNTER — Other Ambulatory Visit: Payer: Self-pay | Admitting: Cardiovascular Disease

## 2015-07-21 NOTE — Telephone Encounter (Signed)
Rx(s) sent to pharmacy electronically.  

## 2015-07-26 ENCOUNTER — Other Ambulatory Visit: Payer: Self-pay | Admitting: Cardiovascular Disease

## 2015-07-27 ENCOUNTER — Other Ambulatory Visit: Payer: Self-pay | Admitting: Cardiovascular Disease

## 2015-07-28 NOTE — Telephone Encounter (Signed)
Rx(s) sent to pharmacy electronically.  

## 2015-07-28 NOTE — Telephone Encounter (Signed)
Refill

## 2015-08-05 ENCOUNTER — Other Ambulatory Visit: Payer: Self-pay | Admitting: Family Medicine

## 2015-08-05 MED ORDER — CITALOPRAM HYDROBROMIDE 40 MG PO TABS: 40.0000 mg | ORAL_TABLET | Freq: Every day | ORAL | Status: DC

## 2015-08-05 NOTE — Telephone Encounter (Signed)
Med refilled to mail order

## 2015-08-07 ENCOUNTER — Other Ambulatory Visit: Payer: Self-pay | Admitting: Family Medicine

## 2015-08-07 NOTE — Telephone Encounter (Signed)
error 

## 2015-08-28 ENCOUNTER — Other Ambulatory Visit: Payer: Self-pay | Admitting: *Deleted

## 2015-08-28 MED ORDER — FUROSEMIDE 40 MG PO TABS: 40.0000 mg | ORAL_TABLET | Freq: Every day | ORAL | Status: DC

## 2015-08-28 MED ORDER — CARVEDILOL 12.5 MG PO TABS: 12.5000 mg | ORAL_TABLET | Freq: Two times a day (BID) | ORAL | Status: DC

## 2015-08-28 MED ORDER — POTASSIUM CHLORIDE ER 10 MEQ PO CPCR: 10.0000 meq | ORAL_CAPSULE | Freq: Every day | ORAL | Status: DC

## 2015-08-28 MED ORDER — PANTOPRAZOLE SODIUM 40 MG PO TBEC: 40.0000 mg | DELAYED_RELEASE_TABLET | Freq: Every day | ORAL | Status: DC

## 2015-08-28 MED ORDER — ROSUVASTATIN CALCIUM 20 MG PO TABS: 20.0000 mg | ORAL_TABLET | Freq: Every day | ORAL | Status: DC

## 2015-08-28 MED ORDER — CLOPIDOGREL BISULFATE 75 MG PO TABS
75.0000 mg | ORAL_TABLET | Freq: Every day | ORAL | Status: DC
Start: 2015-08-28 — End: 2015-10-21

## 2015-09-03 ENCOUNTER — Other Ambulatory Visit: Payer: Self-pay | Admitting: *Deleted

## 2015-09-03 MED ORDER — TELMISARTAN 40 MG PO TABS: 40.0000 mg | ORAL_TABLET | Freq: Every day | ORAL | Status: DC

## 2015-09-03 MED ORDER — AMLODIPINE BESYLATE 2.5 MG PO TABS
2.5000 mg | ORAL_TABLET | Freq: Every day | ORAL | Status: DC
Start: 2015-09-03 — End: 2015-12-02

## 2015-09-07 ENCOUNTER — Other Ambulatory Visit: Payer: Self-pay | Admitting: Family Medicine

## 2015-09-08 NOTE — Telephone Encounter (Signed)
?   OK to Refill  

## 2015-09-08 NOTE — Telephone Encounter (Signed)
ok 

## 2015-09-08 NOTE — Telephone Encounter (Signed)
rx called in

## 2015-09-08 NOTE — Telephone Encounter (Signed)
Ok to refill??  Last office visit 07/03/2015.  Last refill 11/07/2014, #2 refills.

## 2015-09-09 ENCOUNTER — Telehealth: Payer: Self-pay | Admitting: Family Medicine

## 2015-09-09 DIAGNOSIS — G43009 Migraine without aura, not intractable, without status migrainosus: Secondary | ICD-10-CM

## 2015-09-09 MED ORDER — OXYCODONE-ACETAMINOPHEN 10-325 MG PO TABS: 1.0000 | ORAL_TABLET | Freq: Four times a day (QID) | ORAL | Status: DC | PRN

## 2015-09-09 NOTE — Telephone Encounter (Signed)
?   OK to Refill  

## 2015-09-09 NOTE — Telephone Encounter (Signed)
Pt is requesting a refill of Oxycodone. [id number]

## 2015-09-09 NOTE — Telephone Encounter (Signed)
ok 

## 2015-09-10 NOTE — Telephone Encounter (Signed)
RX printed, left up front and patient aware to pick up  

## 2015-09-11 ENCOUNTER — Encounter: Payer: Self-pay | Admitting: Nurse Practitioner

## 2015-09-11 ENCOUNTER — Ambulatory Visit (INDEPENDENT_AMBULATORY_CARE_PROVIDER_SITE_OTHER): Payer: 59 | Admitting: Nurse Practitioner

## 2015-09-11 VITALS — BP 128/96 | HR 72 | Ht 68.0 in | Wt 275.0 lb

## 2015-09-11 DIAGNOSIS — R072 Precordial pain: Secondary | ICD-10-CM

## 2015-09-11 DIAGNOSIS — I480 Paroxysmal atrial fibrillation: Secondary | ICD-10-CM

## 2015-09-11 DIAGNOSIS — R0789 Other chest pain: Secondary | ICD-10-CM

## 2015-09-11 DIAGNOSIS — I119 Hypertensive heart disease without heart failure: Secondary | ICD-10-CM | POA: Diagnosis not present

## 2015-09-11 DIAGNOSIS — E785 Hyperlipidemia, unspecified: Secondary | ICD-10-CM

## 2015-09-11 DIAGNOSIS — I25119 Atherosclerotic heart disease of native coronary artery with unspecified angina pectoris: Secondary | ICD-10-CM

## 2015-09-11 MED ORDER — TELMISARTAN 40 MG PO TABS: 40.0000 mg | ORAL_TABLET | Freq: Every day | ORAL | Status: DC

## 2015-09-11 MED ORDER — AMLODIPINE BESYLATE 2.5 MG PO TABS: 2.5000 mg | ORAL_TABLET | Freq: Every day | ORAL | Status: DC

## 2015-09-11 NOTE — Progress Notes (Signed)
Office Visit    Patient Name: Lee [name] Date of Encounter: 09/11/2015  Primary Care Provider:  Odette Fraction, MD Primary Cardiologist:  Corky Downs, MD   Chief Complaint    64 year old male with a history of coronary artery disease status post prior LAD and RCA stenting who presents for follow-up related to chest pain.  Past Medical History    Past Medical History  Diagnosis Date  . Arthritis   . Hyperlipidemia   . Hypertensive heart disease   . COPD (chronic obstructive pulmonary disease) ([place])   . PVC's (premature ventricular contractions)   . Paroxysmal atrial fibrillation ([facility])     a. s/p afib ablation 10/22/08 ([facility]).  Marland Kitchen CAD (coronary artery disease)     a. 2009 PCI/DES to mLAD; b. 05/2010 s/p PCI RCA and LAD.; c. 05/2013 Cath: LAD mild ISR, LCX nl, RCA 40p, patent stent.  . DJD (degenerative joint disease)   . Migraines   . Iron deficiency anemia   . Esophagitis 1991    grade 1  . Diverticulosis   . Hemorrhoids   . GERD (gastroesophageal reflux disease)     Hiatal hernia  . Allergic rhinitis     chronic sinusitis  . Cataract     has had lasik surg  . Sleep apnea     Questionalble: RDI during the total sleep time 6h 28 mins was 3.55/hr during REM sleep was at 5.71/hr. Supine AHI was 5.93/hr.   Past Surgical History  Procedure Laterality Date  . Lasik    . Nasal sinus surgery  2001  . Coronary angioplasty with stent placement      3 stents/ 4 caths  . Knee arthroscopy      right  . Neck surgery      Cervical fusion  . Shoulder open rotator cuff repair      right  . Rotator cuff repair      left  . Wrist arthrocentesis      left  . Spine surgery    . Radiofrequency ablation  May 2010    atrial fibrillation  . Eye surgery    . Cardiac catheterization  05/2010    The proximal LAD was then predilated with 2.0 x 12 trek. this was then stented with a 2.5 x 16 promus Element drug-eluting stent at 14 atmosphere and prostdilated with 2.75 x 12  [place] trek at 16 atmospheres(2.8 mm) resulting the reduction of 80% proximal LAD stenosis to 0% residual with excellent flow.  . Cardiac catheterization  06/05/2010    Successful percutaneous coronary intervention to the right coronary artery with percutaneous transluminal coronary angioplasty/stenting and insertion 3.0 x 16 mm Promus DES post dilated to 3.35 mm with stenosis being reduced to 0%  . Cardiac catheterization  02/2008    Post dilatation was performed using a 2.75 x 9 [place] sprinter, 10 atmospheres for 40 seconds and then 9 atmosphere for 35 sec. This resulted in the 80% area of narrowing pre-intervention, now appearing to normal. There was no edvidence of the dissection or thrombus and there was TIMI III flow pre and post.  . Cardiac catheterization  02/2006  . Left heart catheterization with coronary angiogram N/A 06/29/2013    Procedure: LEFT HEART CATHETERIZATION WITH CORONARY ANGIOGRAM;  Surgeon: Leonie Man, MD;  Location: [facility];  Service: Cardiovascular;  Laterality: N/A;    Allergies  Allergies  Allergen Reactions  . Ibuprofen Swelling  . Other Shortness Of Breath  BETA BLOCKERS  . Topamax [Topiramate] Other (See Comments)    Pt states it made his tongue feel "scalded" and he couldn't eat   . Toprol Xl [Metoprolol Succinate] Other (See Comments)    Personality change    History of Present Illness    65 year old male with the above, please past medical history. He is status post LAD stenting in 2009 with repeat LAD and RCA stenting in 2012.  Last catheterization in January 2015 showed mild in-stent restenosis within the LAD with a patent RCA stent. He was last seen in clinic just about a year ago. Over the past year, he has done reasonably well. He has changed his diet and has been more active, now walking about 30 minutes every day. His weight is down about 6 pounds since February. Over the past week, he has been experiencing intermittent rest and exertional  substernal chest discomfort without associated symptoms lasting anywhere between 15 mins to 2 to 3 hours at a time and resolving spontaneously. Over the past 3 days, he has been walking half an hour daily and has not been having exertional symptoms but sometimes when he sits in his car he'll feel mild discomfort in his chest or feel short of breath. For these reasons, he presents today. He denies PND, orthopnea, dizziness, syncope, edema, or early satiety.  Home Medications    Prior to Admission medications   Medication Sig Start Date End Date Taking? Authorizing Provider  amLODipine (NORVASC) 2.5 MG tablet Take 1 tablet (2.5 mg total) by mouth daily. 09/11/15  Yes Rogelia Mire, NP  aspirin 81 MG tablet Take 81 mg by mouth daily.   Yes Historical Provider, MD  carvedilol (COREG) 12.5 MG tablet Take 12.5 mg by mouth. Take 2 tablets in the morning and 1 tablet at night.   Yes Historical Provider, MD  cholecalciferol (VITAMIN D) 1000 UNITS tablet Take 1,000 Units by mouth daily.   Yes Historical Provider, MD  citalopram (CELEXA) 40 MG tablet Take 1 tablet (40 mg total) by mouth daily. 08/05/15  Yes Susy Frizzle, MD  clopidogrel (PLAVIX) 75 MG tablet Take 1 tablet (75 mg total) by mouth daily. NEED OV. 08/28/15  Yes Troy Sine, MD  Coenzyme Q10 (CO Q 10) 100 MG CAPS Take 1 capsule by mouth daily.   Yes Historical Provider, MD  fluticasone (FLONASE) 50 MCG/ACT nasal spray INSTILL 2 SPRAYS IN THE NOSE AT BEDTIME 04/30/15  Yes Susy Frizzle, MD  furosemide (LASIX) 40 MG tablet Take 1 tablet (40 mg total) by mouth daily. 08/28/15  Yes Troy Sine, MD  nitroGLYCERIN (NITROSTAT) 0.4 MG SL tablet Place 1 tablet (0.4 mg total) under the tongue every 5 (five) minutes as needed for chest pain. Need ov. 07/28/15  Yes Troy Sine, MD  oxyCODONE-acetaminophen (PERCOCET) 10-325 MG tablet Take 1 tablet by mouth every 6 (six) hours as needed for pain. 09/09/15  Yes Susy Frizzle, MD  pantoprazole  (PROTONIX) 40 MG tablet Take 1 tablet (40 mg total) by mouth daily. NEED OV. 08/28/15  Yes Troy Sine, MD  potassium chloride (MICRO-K) 10 MEQ CR capsule Take 1 capsule (10 mEq total) by mouth daily. NEED OV. 08/28/15  Yes Troy Sine, MD  rosuvastatin (CRESTOR) 20 MG tablet Take 1 tablet (20 mg total) by mouth daily. NEED OV. 08/28/15  Yes Troy Sine, MD  telmisartan (MICARDIS) 40 MG tablet Take 1 tablet (40 mg total) by mouth daily. 09/11/15  Yes  Rogelia Mire, NP  triazolam (HALCION) 0.25 MG tablet TAKE 1 TABLET BY MOUTH EVERY NIGHT AT BEDTIME AS NEEDED SLEEP 09/08/15  Yes Susy Frizzle, MD  UNABLE TO FIND CPAP therapy   Yes Historical Provider, MD    Review of Systems    As above, he has been having intermittent chest discomfort both at rest and with exertion. This seems to have improved over the past 3 days.  All other systems reviewed and are otherwise negative except as noted above.  Physical Exam    VS:  BP 128/96 mmHg  Pulse 72  Ht 5\' 8"  (1.727 m)  Wt 275 lb (124.739 kg)  BMI 41.82 kg/m2 , BMI Body mass index is 41.82 kg/(m^2). GEN: Well nourished, well developed, in no acute distress. HEENT: normal. Neck: Supple, no JVD, carotid bruits, or masses. Cardiac: RRR, no murmurs, rubs, or gallops. No clubbing, cyanosis, edema.  Radials/DP/PT 2+ and equal bilaterally.  Respiratory:  Respirations regular and unlabored, clear to auscultation bilaterally. GI: Soft, nontender, nondistended, BS + x 4. MS: no deformity or atrophy. Skin: warm and dry, no rash. Neuro:  Strength and sensation are intact. Psych: Normal affect.  Accessory Clinical Findings    ECG - Regular sinus rhythm, 72, right bundle branch block, first-degree AV block, prior inferior infarct, no acute ST or T changes.  Assessment & Plan    1.  Midsternal chest pain/coronary artery disease: Patient presents with a one-week history of intermittent rest and exertional midsternal chest discomfort that lasts  between 15 minutes and 3 hours. Over the past 3 days, he's been walking daily without any symptoms during his walk but then he may develop mild dyspnea or chest discomfort once he returns to his car. In light of both typical and atypical symptoms, I will arrange for a Lexiscan Myoview to rule out ischemia. He remains on aspirin, beta blocker, Plavix, statin, and calcium channel blocker therapy.  2. Hypertensive heart disease: Blood pressure stable today on beta blocker, ARB, and calcium channel blocker therapy.  3. Hyperlipidemia: He remains on moderate dose Crestor therapy with an LDL of 53 in December 2016.  4. Morbid obesity: Patient is exercising and watching his diet. He is motivated and is down 6 pounds since February.  5. History of paroxysmal atrial fibrillation: Status post ablation. He is no longer on anticoagulation. He had been expressing palpitations but these have or less resolved with titration of carvedilol therapy.  6. Disposition: We will arrange for stress testing as above and determine follow-up based on results.   Murray Hodgkins, NP 09/11/2015, 11:55 AM

## 2015-09-11 NOTE — Patient Instructions (Signed)
Your physician has requested that you have a lexiscan myoview. For further information please visit [email or web address]. Please follow instruction sheet, as given.   Your physician recommends that you return for lab work.   Your physician recommends that you schedule a follow-up appointment in: 3 months with [name].

## 2015-09-18 ENCOUNTER — Telehealth (HOSPITAL_COMMUNITY): Payer: Self-pay

## 2015-09-18 NOTE — Telephone Encounter (Signed)
Encounter complete. 

## 2015-09-23 ENCOUNTER — Ambulatory Visit (HOSPITAL_COMMUNITY)
Admission: RE | Admit: 2015-09-23 | Discharge: 2015-09-23 | Disposition: A | Discharge status: Home / Self Care | Payer: 59 | Source: Ambulatory Visit | Attending: Cardiovascular Disease | Admitting: Cardiovascular Disease

## 2015-09-23 DIAGNOSIS — Z6841 Body Mass Index (BMI) 40.0 and over, adult: Secondary | ICD-10-CM | POA: Insufficient documentation

## 2015-09-23 DIAGNOSIS — R072 Precordial pain: Secondary | ICD-10-CM | POA: Insufficient documentation

## 2015-09-23 DIAGNOSIS — R5383 Other fatigue: Secondary | ICD-10-CM | POA: Insufficient documentation

## 2015-09-23 DIAGNOSIS — R0602 Shortness of breath: Secondary | ICD-10-CM | POA: Diagnosis not present

## 2015-09-23 DIAGNOSIS — I119 Hypertensive heart disease without heart failure: Secondary | ICD-10-CM

## 2015-09-23 DIAGNOSIS — I4510 Unspecified right bundle-branch block: Secondary | ICD-10-CM | POA: Diagnosis not present

## 2015-09-23 DIAGNOSIS — Z87891 Personal history of nicotine dependence: Secondary | ICD-10-CM | POA: Insufficient documentation

## 2015-09-23 DIAGNOSIS — G4733 Obstructive sleep apnea (adult) (pediatric): Secondary | ICD-10-CM | POA: Insufficient documentation

## 2015-09-23 DIAGNOSIS — E669 Obesity, unspecified: Secondary | ICD-10-CM | POA: Insufficient documentation

## 2015-09-23 DIAGNOSIS — Z8249 Family history of ischemic heart disease and other diseases of the circulatory system: Secondary | ICD-10-CM | POA: Insufficient documentation

## 2015-09-23 DIAGNOSIS — R0609 Other forms of dyspnea: Secondary | ICD-10-CM | POA: Diagnosis not present

## 2015-09-23 DIAGNOSIS — I480 Paroxysmal atrial fibrillation: Secondary | ICD-10-CM | POA: Diagnosis not present

## 2015-09-23 DIAGNOSIS — R0789 Other chest pain: Secondary | ICD-10-CM

## 2015-09-23 LAB — MYOCARDIAL PERFUSION IMAGING
CHL CUP NUCLEAR SDS: 3
CHL CUP NUCLEAR SRS: 1
CHL CUP RESTING HR STRESS: 63 {beats}/min
LV dias vol: 89 mL (ref 62–150)
LV sys vol: 34 mL
Peak HR: 78 {beats}/min
SSS: 4
TID: 1.18

## 2015-09-23 MED ORDER — TECHNETIUM TC 99M SESTAMIBI GENERIC - CARDIOLITE
9.8000 | Freq: Once | INTRAVENOUS | Status: AC | PRN
  Administered 2015-09-23: 10 via INTRAVENOUS

## 2015-09-23 MED ORDER — REGADENOSON 0.4 MG/5ML IV SOLN
0.4000 mg | Freq: Once | INTRAVENOUS | Status: AC
  Administered 2015-09-23: 0.4 mg via INTRAVENOUS

## 2015-09-23 MED ORDER — TECHNETIUM TC 99M SESTAMIBI GENERIC - CARDIOLITE
31.0000 | Freq: Once | INTRAVENOUS | Status: AC | PRN
  Administered 2015-09-23: 31 via INTRAVENOUS

## 2015-10-14 ENCOUNTER — Other Ambulatory Visit: Payer: Self-pay | Admitting: *Deleted

## 2015-10-14 MED ORDER — FLUTICASONE PROPIONATE 50 MCG/ACT NA SUSP: NASAL | Status: DC

## 2015-10-14 NOTE — Telephone Encounter (Signed)
Received fax requesting refill on Flonase.   Refill appropriate and filled per protocol.  

## 2015-10-21 ENCOUNTER — Other Ambulatory Visit: Payer: Self-pay | Admitting: Cardiovascular Disease

## 2015-10-21 NOTE — Telephone Encounter (Signed)
Rx(s) sent to pharmacy electronically.  

## 2015-11-25 ENCOUNTER — Telehealth: Payer: Self-pay | Admitting: Family Medicine

## 2015-11-25 NOTE — Telephone Encounter (Signed)
Patient stopped by and states that he needs a refill on his carvedilol (COREG) 12.5 MG tablet he would like 90 day supply called into [facility] he states that him and Dr. Dennard Schaumann discussed him taking 2 in the morning and 1 in the evening.  CB# [phone]

## 2015-11-25 NOTE — Telephone Encounter (Signed)
Cardiology note from April states Coreg 12.5mg  - take 2 in am and 1 in pm So okay to send in new script so he has enough

## 2015-11-25 NOTE — Telephone Encounter (Signed)
Called and spoke to pt and he states that you told him in LOV that you would give him an rx for 2 tabs in am and one tab in pm. If ok will send in RX but did not see that stated in the notes. He states that he feels better when taking like this and not having any heart problems at all when he take like this. But when he takes it 1 tab bid he starts to have heart problems and he is scheduled to see Dr. Claiborne Billings July 27th. He is also aware you are OOT this week.

## 2015-11-26 MED ORDER — CARVEDILOL 12.5 MG PO TABS: ORAL_TABLET | ORAL | Status: DC

## 2015-11-26 NOTE — Telephone Encounter (Signed)
Medication called/sent to requested pharmacy and pt aware 

## 2015-12-02 ENCOUNTER — Other Ambulatory Visit: Payer: Self-pay | Admitting: Cardiovascular Disease

## 2015-12-15 LAB — CBC
HEMATOCRIT: 40.1 % (ref 38.5–50.0)
Hemoglobin: 13.1 g/dL — ABNORMAL LOW (ref 13.2–17.1)
MCH: 28.7 pg (ref 27.0–33.0)
MCHC: 32.7 g/dL (ref 32.0–36.0)
MCV: 87.7 fL (ref 80.0–100.0)
MPV: 8.9 fL (ref 7.5–12.5)
Platelets: 206 10*3/uL (ref 140–400)
RBC: 4.57 MIL/uL (ref 4.20–5.80)
RDW: 13.2 % (ref 11.0–15.0)
WBC: 5.9 10*3/uL (ref 3.8–10.8)

## 2015-12-16 LAB — LIPID PANEL
CHOL/HDL RATIO: 2.6 ratio (ref <=5.0)
Cholesterol: 114 mg/dL — ABNORMAL LOW (ref 125–200)
HDL: 44 mg/dL (ref >=40)
LDL CALC: 59 mg/dL (ref <130)
TRIGLYCERIDES: 54 mg/dL (ref <150)
VLDL: 11 mg/dL (ref <30)

## 2015-12-16 LAB — COMPREHENSIVE METABOLIC PANEL
ALBUMIN: 4 g/dL (ref 3.6–5.1)
ALT: 25 U/L (ref 9–46)
AST: 21 U/L (ref 10–35)
Alkaline Phosphatase: 72 U/L (ref 40–115)
BUN: 10 mg/dL (ref 7–25)
CALCIUM: 9.1 mg/dL (ref 8.6–10.3)
CHLORIDE: 102 mmol/L (ref 98–110)
CO2: 27 mmol/L (ref 20–31)
Creat: 0.92 mg/dL (ref 0.70–1.25)
GLUCOSE: 114 mg/dL — AB (ref 65–99)
POTASSIUM: 4.8 mmol/L (ref 3.5–5.3)
Sodium: 138 mmol/L (ref 135–146)
Total Bilirubin: 0.4 mg/dL (ref 0.2–1.2)
Total Protein: 6.2 g/dL (ref 6.1–8.1)

## 2015-12-16 LAB — TSH: TSH: 1.21 m[IU]/L (ref 0.40–4.50)

## 2015-12-22 ENCOUNTER — Encounter: Payer: Self-pay | Admitting: Cardiovascular Disease

## 2015-12-22 ENCOUNTER — Ambulatory Visit (INDEPENDENT_AMBULATORY_CARE_PROVIDER_SITE_OTHER): Payer: 59 | Admitting: Cardiovascular Disease

## 2015-12-22 VITALS — BP 120/84 | HR 69 | Ht 68.0 in | Wt 282.0 lb

## 2015-12-22 DIAGNOSIS — I480 Paroxysmal atrial fibrillation: Secondary | ICD-10-CM

## 2015-12-22 DIAGNOSIS — I10 Essential (primary) hypertension: Secondary | ICD-10-CM

## 2015-12-22 DIAGNOSIS — G4733 Obstructive sleep apnea (adult) (pediatric): Secondary | ICD-10-CM | POA: Diagnosis not present

## 2015-12-22 DIAGNOSIS — R002 Palpitations: Secondary | ICD-10-CM

## 2015-12-22 DIAGNOSIS — I2510 Atherosclerotic heart disease of native coronary artery without angina pectoris: Secondary | ICD-10-CM | POA: Diagnosis not present

## 2015-12-22 DIAGNOSIS — E785 Hyperlipidemia, unspecified: Secondary | ICD-10-CM

## 2015-12-22 MED ORDER — ZOLPIDEM TARTRATE 10 MG PO TABS: 10.0000 mg | ORAL_TABLET | Freq: Every evening | ORAL | 2 refills | Status: DC | PRN

## 2015-12-22 NOTE — Patient Instructions (Signed)
Your physician has recommended you make the following change in your medication:   1.) the halcion has been changed to zolpidem. Take 1/2-1 tablet nightly.  Your physician wants you to follow-up in: 1 year or sooner if needed. You will receive a reminder letter in the mail two months in advance. If you don't receive a letter, please call our office to schedule the follow-up appointment.   If you need a refill on your cardiac medications before your next appointment, please call your pharmacy.

## 2015-12-24 ENCOUNTER — Telehealth: Payer: Self-pay | Admitting: Family Medicine

## 2015-12-24 ENCOUNTER — Encounter: Payer: Self-pay | Admitting: Cardiovascular Disease

## 2015-12-24 DIAGNOSIS — G43009 Migraine without aura, not intractable, without status migrainosus: Secondary | ICD-10-CM

## 2015-12-24 NOTE — Telephone Encounter (Signed)
Ok to refill 

## 2015-12-24 NOTE — Progress Notes (Addendum)
Patient ID: Lee [name], male   DOB: [birth year], 65 y.o.   MRN: [id number]     Primary M.D.: Dr. Jenna Luo  HPI: Lee [name], is a 65 y.o. male who presents to the office today for a 15 month followup cardiology evaluation.  Lee [name] has known CAD and in 2009 underwent stenting of his mid LAD. In 2012 he underwent staged intervention with stenting of his proximal RCA and at a new site in his proximal LAD. A nuclear perfusion study February 2014 showed normal perfusion and function. In January 2015, he was seen by [name] in the office with complaints of chest pressure. He underwent cardiac catheterization this chest pain which was done by Dr. [name] on 06/29/2013. Catheterization did not demonstrate significant restenosis of his LAD stents with mild 20% narrowing in the proximal stent and 40% mid stent. The circumflex was normal. His RCA had 40% narrowing just proximal to a widely patent stent in the mid vessel. Nitrate therapy was added to his medical regimen but due to significant nitrate mediated headaches this ultimately was discontinued.   In 2007, a sleep study which suggested upper airway resistance syndrome without overall sleep apnea with the exception of mild sleep apnea with REM sleep at that time CPAP was not recommended. In June 2014 he was having symptoms suggestive of progressive sleep apnea. He ultimately underwent a sleep study. I do not have the specifics of this diagnostic polysomnogram but subsequently he did undergo a CPAP titration to apparently his AHI was 12 on his initial study and he dropped his oxygen from 96% to 83%. He ultimately was titrated up to a CPAP pressure of 11 cm.  Additional problems also include obesity , as well as a history of palpitations.  I had titrated his beta blocker therapy which improved his palpitations.  He remains active.  He denies any episodes of chest pain.  He denies presyncope or syncope.  He underwent a  follow-up myocardial perfusion study on 09/23/2015.  This showed an ejection fraction at 62%.  There were no ECG changes.  He had normal perfusion without scar or ischemia.  He admits to continued 100% compliance with CPAP.  However, he is having difficulty with sleep initiation.  A review of his records indicates that he has been taking triazolam at bedtime.  [facility] uses [facility] for his [facility] company.  He presents for evaluation  Past Medical History:  Diagnosis Date  . Allergic rhinitis    chronic sinusitis  . Arthritis   . CAD (coronary artery disease)    a. 2009 PCI/DES to mLAD; b. 05/2010 s/p PCI RCA and LAD.; c. 05/2013 Cath: LAD mild ISR, LCX nl, RCA 40p, patent stent.  . Cataract    has had lasik surg  . COPD (chronic obstructive pulmonary disease) ([place])   . Diverticulosis   . DJD (degenerative joint disease)   . Esophagitis 1991   grade 1  . GERD (gastroesophageal reflux disease)    Hiatal hernia  . Hemorrhoids   . Hyperlipidemia   . Hypertensive heart disease   . Iron deficiency anemia   . Migraines   . Paroxysmal atrial fibrillation (HCC)    a. s/p afib ablation 10/22/08 (J. Allred).  . PVC's (premature ventricular contractions)   . Sleep apnea    Questionalble: RDI during the total sleep time 6h 28 mins was 3.55/hr during REM sleep was at 5.71/hr. Supine AHI was 5.93/hr.    Past Surgical History:  Procedure Laterality Date  . CARDIAC CATHETERIZATION  05/2010   The proximal LAD was then predilated with 2.0 x 12 trek. this was then stented with a 2.5 x 16 promus Element drug-eluting stent at 14 atmosphere and prostdilated with 2.75 x 12 [place] trek at 16 atmospheres(2.8 mm) resulting the reduction of 80% proximal LAD stenosis to 0% residual with excellent flow.  Marland Kitchen CARDIAC CATHETERIZATION  06/05/2010   Successful percutaneous coronary intervention to the right coronary artery with percutaneous transluminal coronary angioplasty/stenting and insertion 3.0 x 16 mm Promus DES  post dilated to 3.35 mm with stenosis being reduced to 0%  . CARDIAC CATHETERIZATION  02/2008   Post dilatation was performed using a 2.75 x 9 [place] sprinter, 10 atmospheres for 40 seconds and then 9 atmosphere for 35 sec. This resulted in the 80% area of narrowing pre-intervention, now appearing to normal. There was no edvidence of the dissection or thrombus and there was TIMI III flow pre and post.  . CARDIAC CATHETERIZATION  02/2006  . CORONARY ANGIOPLASTY WITH STENT PLACEMENT     3 stents/ 4 caths  . EYE SURGERY    . KNEE ARTHROSCOPY     right  . LASIK    . LEFT HEART CATHETERIZATION WITH CORONARY ANGIOGRAM N/A 06/29/2013   Procedure: LEFT HEART CATHETERIZATION WITH CORONARY ANGIOGRAM;  Surgeon: Leonie Man, MD;  Location[facility] C[facility] LAB;  Service: Cardiovascular;  Laterality: N/A;  . NASAL SINUS SURGERY  2001  . NECK SURGERY     Cervical fusion  . RADIOFREQUENCY ABLATION  May 2010   atrial fibrillation  . ROTATOR CUFF REPAIR     left  . SHOULDER OPEN ROTATOR CUFF REPAIR     right  . SPINE SURGERY    . WRIST ARTHROCENTESIS     left    Allergies  Allergen Reactions  . Ibuprofen Swelling  . Other Shortness Of Breath    BETA BLOCKERS  . Topamax [Topiramate] Other (See Comments)    Pt states it made his tongue feel "scalded" and he couldn't eat   . Toprol Xl [Metoprolol Succinate] Other (See Comments)    Personality change    Current Outpatient Prescriptions  Medication Sig Dispense Refill  . amLODipine (NORVASC) 2.5 MG tablet Take 1 tablet (2.5 mg total) by mouth daily. 90 tablet 3  . aspirin 81 MG tablet Take 81 mg by mouth daily.    . carvedilol (COREG) 12.5 MG tablet 2 tab po qam; 1 tab po qpm 270 tablet 3  . cholecalciferol (VITAMIN D) 1000 UNITS tablet Take 1,000 Units by mouth daily.    . citalopram (CELEXA) 40 MG tablet Take 1 tablet (40 mg total) by mouth daily. 90 tablet 3  . clopidogrel (PLAVIX) 75 MG tablet Take 1 tablet by mouth  every day 90 tablet 2  .  Coenzyme Q10 (CO Q 10) 100 MG CAPS Take 1 capsule by mouth daily.    . fluticasone (FLONASE) 50 MCG/ACT nasal spray INSTILL 2 SPRAYS IN THE NOSE AT BEDTIME 48 g 3  . furosemide (LASIX) 40 MG tablet Take 1 tablet by mouth  daily 90 tablet 2  . nitroGLYCERIN (NITROSTAT) 0.4 MG SL tablet Place 1 tablet (0.4 mg total) under the tongue every 5 (five) minutes as needed for chest pain. Need ov. 7 tablet 0  . oxyCODONE-acetaminophen (PERCOCET) 10-325 MG tablet Take 1 tablet by mouth every 6 (six) hours as needed for pain. 30 tablet 0  . pantoprazole (PROTONIX) 40 MG tablet  Take 1 tablet by mouth  every day 90 tablet 2  . potassium chloride (MICRO-K) 10 MEQ CR capsule Take 1 capsule by mouth  every day 90 capsule 2  . rosuvastatin (CRESTOR) 20 MG tablet Take 1 tablet (20 mg total) by mouth daily. NEED OV. 180 tablet 0  . telmisartan (MICARDIS) 40 MG tablet Take 1 tablet by mouth  daily 90 tablet 1  . UNABLE TO FIND CPAP therapy    . zolpidem (AMBIEN) 10 MG tablet Take 1 tablet (10 mg total) by mouth at bedtime as needed (for sleep initiation). 30 tablet 2   No current facility-administered medications for this visit.     Socially he's married has 2 children he works in Development worker, international aid. There is a remote tobacco history but he quit in 1989.  ROS General: Negative; No fevers, chills, or night sweats; positive for obesity HEENT: Negative; No changes in vision or hearing, sinus congestion, difficulty swallowing Pulmonary: Negative; No cough, wheezing, shortness of breath, hemoptysis Cardiovascular: See history of present illness;  GI: Negative; No nausea, vomiting, diarrhea, or abdominal pain GU: Positive for GERD; No dysuria, hematuria, or difficulty voiding Musculoskeletal: Negative; no myalgias, joint pain, or weakness Hematologic/Oncology: Negative; no easy bruising, bleeding Endocrine: Negative; no heat/cold intolerance; no diabetes Neuro: Positive for history of migraine headaches; no changes in  balance,  Skin: Negative; No rashes or skin lesions Psychiatric: Negative; No behavioral problems, depression Sleep: Positive for obstructive sleep apnea, now on CPAP therapy; No snoring, daytime sleepiness, hypersomnolence, bruxism, restless legs, hypnogognic hallucinations, no cataplexy Other comprehensive 14 point system review is negative.  PE BP 120/84   Pulse 69   Ht '5\' 8"'  (1.727 m)   Wt 282 lb (127.9 kg)   BMI 42.88 kg/m   Morbid obesity  Wt Readings from Last 3 Encounters:  12/22/15 282 lb (127.9 kg)  09/23/15 275 lb (124.7 kg)  09/11/15 275 lb (124.7 kg)   General: Alert, oriented, no distress.  Skin: normal turgor, no rashes HEENT: Normocephalic, atraumatic. Pupils round and reactive; sclera anicteric;no lid lag.  Nose without nasal septal hypertrophy Mouth/Parynx benign; Mallinpatti scale 3 Neck: Thick neck;No JVD, no carotid bruits with normal carotid upstrokes Lungs: clear to ausculatation and percussion; no wheezing or rales Chest wall: Nontender to palpation Heart: RRR, s1 s2 normal 1/6 sem; no diastolic murmur.  No rubs, thrills or heaves. Abdomen: soft, nontender; no hepatosplenomehaly, BS+; abdominal aorta nontender and not dilated by palpation. Back: No CVA tenderness Pulses 2+  Extremities: no clubbing cyanosis or edema, Homan's sign negative  Neurologic: grossly nonfocal Psychological normal affect and mood  ECG (independently read by me): Sinus rhythm at 69 bpm with right bundle branch block and first-degree AV block.  Q wave is present in lead 3.  April 2016 ECG (independently read by me): Normal sinus rhythm at 68 with right bundle branch block and repolarization changes.  First-degree AV block.  No ectopy  October 2015 ECG (independently read by me): Normal sinus rhythm with right bundle branch block.  Borderline first-degree AV block with PR interval 206 ms.  No ectopy  Prior February 2015 ECG (independently read by me): Normal sinus rhythm at 66  beats per minute, right bundle branch block with repolarization changes, first-degree AV block.  ECG from 12/12/2012: NSR at 69, RBBB  LABS:  BMP Latest Ref Rng & Units 12/15/2015 05/27/2015 05/01/2014  Glucose 65 - 99 mg/dL 114(H) 95 101(H)  BUN 7 - 25 mg/dL '10 12 11  ' Creatinine  0.70 - 1.25 mg/dL 0.92 0.84 0.86  Sodium 135 - 146 mmol/L 138 134(L) 137  Potassium 3.5 - 5.3 mmol/L 4.8 4.3 5.0  Chloride 98 - 110 mmol/L 102 99 101  CO2 20 - 31 mmol/L '27 27 29  ' Calcium 8.6 - 10.3 mg/dL 9.1 9.2 9.5    Hepatic Function Latest Ref Rng & Units 12/15/2015 05/27/2015 05/01/2014  Total Protein 6.1 - 8.1 g/dL 6.2 6.1 6.6  Albumin 3.6 - 5.1 g/dL 4.0 3.9 4.2  AST 10 - 35 U/L '21 20 20  ' ALT 9 - 46 U/L '25 25 25  ' Alk Phosphatase 40 - 115 U/L 72 52 52  Total Bilirubin 0.2 - 1.2 mg/dL 0.4 0.6 0.5    CBC Latest Ref Rng & Units 12/15/2015 05/27/2015 05/01/2014  WBC 3.8 - 10.8 K/uL 5.9 7.1 5.5  Hemoglobin 13.2 - 17.1 g/dL 13.1(L) 13.3 14.1  Hematocrit 38.5 - 50.0 % 40.1 40.6 41.1  Platelets 140 - 400 K/uL 206 196 221   Lab Results  Component Value Date   MCV 87.7 12/15/2015   MCV 88.3 05/27/2015   MCV 85.1 05/01/2014    BNP    Component Value Date/Time   PROBNP 36.0 06/04/2010 2206    Lipid Panel     Component Value Date/Time   CHOL 114 (L) 12/15/2015 1040   CHOL 155 11/21/2012 1025   TRIG 54 12/15/2015 1040   TRIG 81 11/21/2012 1025   HDL 44 12/15/2015 1040   HDL 45 11/21/2012 1025   CHOLHDL 2.6 12/15/2015 1040   VLDL 11 12/15/2015 1040   LDLCALC 59 12/15/2015 1040   LDLCALC 94 11/21/2012 1025     RADIOLOGY: No results found.    ASSESSMENT AND PLAN: Mr. Habib Is a 65 year old male who has known CAD and is status post intervention to his proximal and mid LAD as well as his RCA.  A repeat cardiac catheterization by Dr. Ellyn Hack did not demonstrate significant cardiac etiology to his chest pain. He was empirically started on nitrate therapy but this has resulted in frequent  migraine headaches. It is possible that some of his pain may have been due to spasm versus esophageal etiology or musculoskeletal symptomatology.  Previously, I discontinued his nitrates due to his headache and started him on amlodipine 2.5 mg.  presently, his blood pressure today is controlled on micardis 40 mg, furosemide 40 mg, amlodipine 2.5 mg and carvedilol 25 mg in the morning and 12.5 mg at night.  He has first-degree AV block and right bundle branch block but this is stable.  He is no longer having palpitations on his increased carvedilol regimen.  He continues to take aspirin and Plavix for dual antiplatelet therapy in light of his CAD.  He is morbidly obese and weight reduction was recommended.  He also has a long-standing history of sleep apnea and continues to be 100% compliant with CPAP use.  However, he has had issues with sleep initiation.  I provided him literature today regarding improve sleep hygiene.  I also have recommended he discontinue triazolam.  I've given him a prescription for more shorter acting zolpidem 10 mg to see if this can improve his sleep initiation.  There is no significant edema on his current dose of furosemide.  He has GERD for which he takes pantoprazole.  Recent blood work was reviewed.  On Crestor 20 mg LDL is at target at 59.  I will see him in one year for reevaluation.  Time spent: 25 minutes Safi Culotta A.  Claiborne Billings, MD, [facility]  12/24/2015 6:49 PM

## 2015-12-24 NOTE — Telephone Encounter (Signed)
Patient needs rx for oxycodone  [phone]

## 2015-12-25 MED ORDER — OXYCODONE-ACETAMINOPHEN 10-325 MG PO TABS: 1.0000 | ORAL_TABLET | Freq: Four times a day (QID) | ORAL | 0 refills | Status: DC | PRN

## 2015-12-25 NOTE — Telephone Encounter (Signed)
RX printed, left up front and patient aware to pick up  

## 2015-12-25 NOTE — Telephone Encounter (Signed)
ok 

## 2016-01-08 ENCOUNTER — Ambulatory Visit (INDEPENDENT_AMBULATORY_CARE_PROVIDER_SITE_OTHER): Payer: 59 | Admitting: Family Medicine

## 2016-01-08 ENCOUNTER — Encounter: Payer: Self-pay | Admitting: Family Medicine

## 2016-01-08 VITALS — BP 118/74 | HR 68 | Temp 98.1°F | Resp 18 | Ht 68.0 in | Wt 280.0 lb

## 2016-01-08 DIAGNOSIS — M25561 Pain in right knee: Secondary | ICD-10-CM | POA: Diagnosis not present

## 2016-01-08 DIAGNOSIS — F32A Depression, unspecified: Secondary | ICD-10-CM

## 2016-01-08 DIAGNOSIS — F329 Major depressive disorder, single episode, unspecified: Secondary | ICD-10-CM | POA: Diagnosis not present

## 2016-01-08 MED ORDER — VENLAFAXINE HCL ER 75 MG PO CP24: 150.0000 mg | ORAL_CAPSULE | Freq: Every day | ORAL | 2 refills | Status: DC

## 2016-01-08 NOTE — Progress Notes (Signed)
Subjective:    Patient ID: Lee [name], male    DOB: May 21, 1951, 65 y.o.   MRN: [id number]  HPI  09/03/14 Patient presents with 2 weeks of worsening knee pain in his right knee. He hurts in the posterior medial knee joint as well as the posterior lateral knee joint. On examination today there is no laxity to varus or valgus stress. He has a negative anterior and posterior drawer sign. However Apley grind test elicit significant pain in his medial meniscal area. He also has some mild tenderness to palpation in the joint lines bilaterally. The patient denies any specific injury. He does have a history of a meniscal tear in that same knee. The pain began gradually while he was walking 2 weeks ago.  At that time, my plan was:  I recommended conservative management initially. Offer the patient a cortisone injection but he declined today. He would like to try pain medication and tincture of time. If not improving over the next 2-3 weeks with time and rest ice and pain medication, he will return for cortisone injection. If no better after the cortisone injection, I would proceed with an MRI to evaluate further  09/24/14 The patient's pain is better but he continues to have pain over the medial aspect of his knee. The pain is mainly over the joint line. On examination today there is no laxity to varus or valgus stress. He has a negative anterior and posterior drawer sign. He continues to have pain in the medial aspect of the knee joint on Apley grind. He is failing conservative therapy and would like to proceed with cortisone injection today  At that time, my plan was: Using sterile technique, the right knee was injected with a mixture of 2 mL of 0.1% lidocaine, 2 mL of Marcaine, and 2 mL of 40 mg per mL Kenalog. Patient tolerated the procedure well without complication. If the patient's knee pain does not improve over the next 2 weeks, I would order an MRI of the knee to evaluate further. If it does  confirm my initial suspicion which is a meniscal tear, I will schedule the patient to see an orthopedic surgeon. He would like to see Dr. Noemi Chapel if necessary.  07/03/15 Patient did relatively well since the last time I saw him. The cortisone shot really helped the knee pain he was having in his right knee. However over the last month or so, the pain has returned. It continues to be located over the posterior medial aspect of the right knee. He has a positive Apley grind today. He has no laxity to varus or valgus stress. He has a negative anterior posterior drawer sign. He does have some enemas to palpation over the medial joint line. There is a slight effusion in the knee joint. Patient states he has pain with walking particular going up and down steps. Furthermore he states the knee feels like it may give out on him at times. He denies any locking of the knee joint.  AT that time, my plan was: I suspect a meniscal tear with underlying osteoarthritis. Using sterile technique, I injected the right knee with a mixture of 2 mL of lidocaine, 2 mL of Marcaine, and 2 mL of 40 mg per mL Kenalog. The patient tolerated the procedure well with no complication. If the pain does not improve at this point, I would recommend an MRI to confirm suspicions and likely orthopedic consultation.  01/08/16 Most recent cortisone injection lasted 6 months. He  would like another today. He is again starting to get pain in the anterior and posterior aspects of the medial joint line. He reports sharp pain with ambulation. He reports pain under his kneecap going up and down steps. He defers an MRI of the present time as he is not willing to get surgery as long as the cortisone injections are working as well as they are. His other concern is worsening depression. He has been on Celexa 40 mg a day for about 5 years. Please see [place]. However he is now having worsening symptoms of depression including anhedonia, poor energy, poor sleeping.  He reports depression. He reports poor concentration. He reports avoidance of crowds due to fear of being in crowds. He is interested in trying something different for his depression  Past Medical History:  Diagnosis Date  . Allergic rhinitis    chronic sinusitis  . Arthritis   . CAD (coronary artery disease)    a. 2009 PCI/DES to mLAD; b. 05/2010 s/p PCI RCA and LAD.; c. 05/2013 Cath: LAD mild ISR, LCX nl, RCA 40p, patent stent.  . Cataract    has had lasik surg  . COPD (chronic obstructive pulmonary disease) ([place])   . Diverticulosis   . DJD (degenerative joint disease)   . Esophagitis 1991   grade 1  . GERD (gastroesophageal reflux disease)    Hiatal hernia  . Hemorrhoids   . Hyperlipidemia   . Hypertensive heart disease   . Iron deficiency anemia   . Migraines   . Paroxysmal atrial fibrillation (HCC)    a. s/p afib ablation 10/22/08 (J. Allred).  . PVC's (premature ventricular contractions)   . Sleep apnea    Questionalble: RDI during the total sleep time 6h 28 mins was 3.55/hr during REM sleep was at 5.71/hr. Supine AHI was 5.93/hr.   Past Surgical History:  Procedure Laterality Date  . CARDIAC CATHETERIZATION  05/2010   The proximal LAD was then predilated with 2.0 x 12 trek. this was then stented with a 2.5 x 16 promus Element drug-eluting stent at 14 atmosphere and prostdilated with 2.75 x 12 [place] trek at 16 atmospheres(2.8 mm) resulting the reduction of 80% proximal LAD stenosis to 0% residual with excellent flow.  Marland Kitchen CARDIAC CATHETERIZATION  06/05/2010   Successful percutaneous coronary intervention to the right coronary artery with percutaneous transluminal coronary angioplasty/stenting and insertion 3.0 x 16 mm Promus DES post dilated to 3.35 mm with stenosis being reduced to 0%  . CARDIAC CATHETERIZATION  02/2008   Post dilatation was performed using a 2.75 x 9 [place] sprinter, 10 atmospheres for 40 seconds and then 9 atmosphere for 35 sec. This resulted in the 80% area of  narrowing pre-intervention, now appearing to normal. There was no edvidence of the dissection or thrombus and there was TIMI III flow pre and post.  . CARDIAC CATHETERIZATION  02/2006  . CORONARY ANGIOPLASTY WITH STENT PLACEMENT     3 stents/ 4 caths  . EYE SURGERY    . KNEE ARTHROSCOPY     right  . LASIK    . LEFT HEART CATHETERIZATION WITH CORONARY ANGIOGRAM N/A 06/29/2013   Procedure: LEFT HEART CATHETERIZATION WITH CORONARY ANGIOGRAM;  Surgeon: Leonie Man, MD;  Location: [facility] CATH LAB;  Service: Cardiovascular;  Laterality: N/A;  . NASAL SINUS SURGERY  2001  . NECK SURGERY     Cervical fusion  . RADIOFREQUENCY ABLATION  May 2010   atrial fibrillation  . ROTATOR CUFF REPAIR  left  . SHOULDER OPEN ROTATOR CUFF REPAIR     right  . SPINE SURGERY    . WRIST ARTHROCENTESIS     left   Current Outpatient Prescriptions on File Prior to Visit  Medication Sig Dispense Refill  . amLODipine (NORVASC) 2.5 MG tablet Take 1 tablet (2.5 mg total) by mouth daily. 90 tablet 3  . aspirin 81 MG tablet Take 81 mg by mouth daily.    . carvedilol (COREG) 12.5 MG tablet 2 tab po qam; 1 tab po qpm 270 tablet 3  . cholecalciferol (VITAMIN D) 1000 UNITS tablet Take 1,000 Units by mouth daily.    . citalopram (CELEXA) 40 MG tablet Take 1 tablet (40 mg total) by mouth daily. 90 tablet 3  . clopidogrel (PLAVIX) 75 MG tablet Take 1 tablet by mouth  every day 90 tablet 2  . Coenzyme Q10 (CO Q 10) 100 MG CAPS Take 1 capsule by mouth daily.    . fluticasone (FLONASE) 50 MCG/ACT nasal spray INSTILL 2 SPRAYS IN THE NOSE AT BEDTIME 48 g 3  . furosemide (LASIX) 40 MG tablet Take 1 tablet by mouth  daily 90 tablet 2  . nitroGLYCERIN (NITROSTAT) 0.4 MG SL tablet Place 1 tablet (0.4 mg total) under the tongue every 5 (five) minutes as needed for chest pain. Need ov. 7 tablet 0  . oxyCODONE-acetaminophen (PERCOCET) 10-325 MG tablet Take 1 tablet by mouth every 6 (six) hours as needed for pain. 30 tablet 0  .  pantoprazole (PROTONIX) 40 MG tablet Take 1 tablet by mouth  every day 90 tablet 2  . potassium chloride (MICRO-K) 10 MEQ CR capsule Take 1 capsule by mouth  every day 90 capsule 2  . rosuvastatin (CRESTOR) 20 MG tablet Take 1 tablet (20 mg total) by mouth daily. NEED OV. 180 tablet 0  . telmisartan (MICARDIS) 40 MG tablet Take 1 tablet by mouth  daily 90 tablet 1  . UNABLE TO FIND CPAP therapy    . zolpidem (AMBIEN) 10 MG tablet Take 1 tablet (10 mg total) by mouth at bedtime as needed (for sleep initiation). 30 tablet 2   No current facility-administered medications on file prior to visit.    Allergies  Allergen Reactions  . Ibuprofen Swelling  . Other Shortness Of Breath    BETA BLOCKERS  . Topamax [Topiramate] Other (See Comments)    Pt states it made his tongue feel "scalded" and he couldn't eat   . Toprol Xl [Metoprolol Succinate] Other (See Comments)    Personality change   Social History   Social History  . Marital status: Married    Spouse name: N/A  . Number of children: 2  . Years of education: N/A   Occupational History  . retired    Social History Main Topics  . Smoking status: Former Smoker    Quit date: 06/01/1987  . Smokeless tobacco: Never Used  . Alcohol use 0.0 oz/week     Comment: once every 6-7 months  . Drug use: No  . Sexual activity: Not on file   Other Topics Concern  . Not on file   Social History Narrative  . No narrative on file      Review of Systems  All other systems reviewed and are negative.      Objective:   Physical Exam  Cardiovascular: Normal rate, regular rhythm and normal heart sounds.   Pulmonary/Chest: Effort normal and breath sounds normal.  Musculoskeletal:  Right knee: He exhibits decreased range of motion, effusion and abnormal meniscus. He exhibits no LCL laxity, normal patellar mobility and no MCL laxity. Tenderness found. Medial joint line and lateral joint line tenderness noted. No MCL and no LCL tenderness  noted.  Vitals reviewed.         Assessment & Plan:  Right knee pain secondary to osteoarthritis and possible medial meniscal tear. Using sterile technique, I injected the right knee with 2 mL of lidocaine, 2 mL of Marcaine, and 2 mL of 40 mg per mL Kenalog. The patient tolerated the procedure well.  Depression, discontinue citalopram. Replaced with Effexor XR 75 mg by mouth every morning and increase 250 mg by mouth every morning and one week. Recheck in 4-6 weeks

## 2016-01-23 ENCOUNTER — Other Ambulatory Visit: Payer: Self-pay | Admitting: Cardiovascular Disease

## 2016-01-30 ENCOUNTER — Other Ambulatory Visit: Payer: Self-pay | Admitting: Cardiovascular Disease

## 2016-01-30 ENCOUNTER — Telehealth: Payer: Self-pay | Admitting: *Deleted

## 2016-01-30 MED ORDER — ROSUVASTATIN CALCIUM 20 MG PO TABS: 20.0000 mg | ORAL_TABLET | Freq: Every day | ORAL | 0 refills | Status: DC

## 2016-01-30 MED ORDER — ROSUVASTATIN CALCIUM 40 MG PO TABS: 40.0000 mg | ORAL_TABLET | Freq: Every day | ORAL | 3 refills | Status: DC

## 2016-01-30 NOTE — Telephone Encounter (Signed)
Apparently this patient's crestor was noted incorrectly. A 40 mg tablet has been sent to both the mail order and local pharmacy.

## 2016-01-30 NOTE — Telephone Encounter (Signed)
Brandy sent you a message in regards to the patients rosuvastatin dose. She wanted to be sure that you received it as she inadvertently closed the note. Looks like the rx was refilled incorrectly for the 20 mg dose after [name] increased it to 40 mg on 09/19/14.

## 2016-01-30 NOTE — Telephone Encounter (Signed)
Medication  rosuvastatin (CRESTOR) 20 MG tablet [35135]  rosuvastatin (CRESTOR) 20 MG tablet [id number] DISCONTINUED  Order Details  Dose, Route, Frequency: As Directed   Dispense Quantity:  60 tablet Refills:  5 Fills remaining:  --        Sig: TAKE 2 TABLETS(40 MG) BY MOUTH DAILY       Discontinue Date:  08/28/2015 [place]:  Rush Landmark, CMA Discontinue Reason:  Reorder  Written Date:  04/04/15 Expiration Date:  04/03/16    Start Date:  04/04/15 End Date:  08/28/15         Ordering Provider:  Troy Sine, MD DEA #:  [id number] NPI:  [id number]   Authorizing Provider:  Troy Sine, MD DEA #:  [id number] NPI:  [id number]   Ordering User:  [place] Order:  rosuvastatin (CRESTOR) 20 MG tablet [id number]    Pharmacy:  [facility] [place], [place] AT [place] #[id number]        I am further titrating his Crestor to 40 mg daily for target LDL less than 70.   Lee [name]  09/19/2014 4:00 PM  Office Visit  MRN:  [id number]  Description: Male DOB: [birth year] Provider: Troy Sine, MD Department: [facility]  Vitals   BP  124/80   Pulse  68   Ht  5\' 8"  (1.727 m)   Wt  270 lb 3.2 oz (122.6 kg)   BMI  41.08 kg/m      Vitals History  Progress Notes   Troy Sine, MD at 09/21/2014 10:13 AM   Status: Signed   Lytle Michaels  12/22/2015 9:45 AM  Office Visit  MRN:  [id number]  Description: Male DOB: [birth year] Provider: Troy Sine, MD Department: [facility]  Vitals   BP  120/84   Pulse  69   Ht  5\' 8"  (1.727 m)   Wt  282 lb (127.9 kg)   BMI  42.88 kg/m      Vitals History  Progress Notes   Troy Sine, MD at 12/22/2015 9:45 AM   Status: Addendum    On Crestor 20 mg LDL is at target at 59.  I will see him in one year for reevaluation.

## 2016-01-30 NOTE — Telephone Encounter (Signed)
12/22/15 office note states that he was taking 20 mg daily at that time. He was instructed to continue that dose.

## 2016-02-05 ENCOUNTER — Encounter: Payer: Self-pay | Admitting: Podiatry

## 2016-02-05 ENCOUNTER — Ambulatory Visit (INDEPENDENT_AMBULATORY_CARE_PROVIDER_SITE_OTHER): Payer: 59 | Admitting: Podiatry

## 2016-02-05 DIAGNOSIS — Q828 Other specified congenital malformations of skin: Secondary | ICD-10-CM

## 2016-02-05 NOTE — Progress Notes (Signed)
He presents today for a follow-up of calluses sub-fifth metatarsophalangeal joints bilateral. The right seems to be worse than the left he says. He states they seem to be doing better as time goes on.  Objective: Pulses are strongly palpable bilateral. No open lesions or wounds are noted. Reactive hyperkeratosis sub-fifth metatarsal head of the bilateral foot right greater than left. Was debrided today does not demonstrate any type of iatrogenic lesion.  Assessment: Porokeratosis sub-fifth metatarsal head bilateral right greater than left.  Plan: Mechanical and chemical debridement of the lesions today under occlusion to be left on for 3 days. He will wash this off in 3 days and follow up with me as needed.

## 2016-02-09 ENCOUNTER — Ambulatory Visit
Admission: RE | Admit: 2016-02-09 | Discharge: 2016-02-09 | Disposition: A | Discharge status: Home / Self Care | Payer: 59 | Source: Ambulatory Visit | Attending: Family Medicine | Admitting: Family Medicine

## 2016-02-09 ENCOUNTER — Encounter: Payer: Self-pay | Admitting: Family Medicine

## 2016-02-09 ENCOUNTER — Ambulatory Visit (INDEPENDENT_AMBULATORY_CARE_PROVIDER_SITE_OTHER): Payer: 59 | Admitting: Family Medicine

## 2016-02-09 VITALS — BP 126/78 | HR 84 | Temp 98.2°F | Resp 22 | Ht 68.0 in | Wt 278.0 lb

## 2016-02-09 DIAGNOSIS — F32A Depression, unspecified: Secondary | ICD-10-CM

## 2016-02-09 DIAGNOSIS — R5383 Other fatigue: Secondary | ICD-10-CM

## 2016-02-09 DIAGNOSIS — F329 Major depressive disorder, single episode, unspecified: Secondary | ICD-10-CM | POA: Diagnosis not present

## 2016-02-09 DIAGNOSIS — R0609 Other forms of dyspnea: Secondary | ICD-10-CM | POA: Diagnosis not present

## 2016-02-09 DIAGNOSIS — I2510 Atherosclerotic heart disease of native coronary artery without angina pectoris: Secondary | ICD-10-CM

## 2016-02-09 NOTE — Progress Notes (Signed)
Subjective:    Patient ID: Lee [name], male    DOB: [birth year], 65 y.o.   MRN: [id number]  HPI   01/08/16 Most recent cortisone injection lasted 6 months. He would like another today. He is again starting to get pain in the anterior and posterior aspects of the medial joint line. He reports sharp pain with ambulation. He reports pain under his kneecap going up and down steps. He defers an MRI of the present time as he is not willing to get surgery as long as the cortisone injections are working as well as they are. His other concern is worsening depression. He has been on Celexa 40 mg a day for about 5 years. Please see [place]. However he is now having worsening symptoms of depression including anhedonia, poor energy, poor sleeping. He reports depression. He reports poor concentration. He reports avoidance of crowds due to fear of being in crowds. He is interested in trying something different for his depression.  At that time, my plan was: Depression, discontinue citalopram. Replaced with Effexor XR 75 mg by mouth every morning and increase 250 mg by mouth every morning and one week. Recheck in 4-6 weeks  02/09/16 Patient has seen no benefit since switching from Celexa to Effexor. However today he states that he's having significant dyspnea on exertion. He is getting easily winded with very little activity. He also reports a daily cough. Patient had a stress test performed of his heart in April of this year which was completely normal with an ejection fraction of 55-65% and no ischemia. His last chest x-ray was in December 2015 and was normal. He has not had pulmonary function test. 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 He was exposed to a tremendous amount of stool or gas and also possible smoke. 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 He denies any hemoptysis. He denies any pleurisy. He also believes his possible shortness of  breath could be due to deconditioning. He also knows that he is overweight which could be also adding to his shortness of breath. In July checked all his lab work including a CBC which showed no evidence of anemia. TSH was normal. However he has not had a testosterone level checked. He also continues to complain of depression.  Past Medical History:  Diagnosis Date  . Allergic rhinitis    chronic sinusitis  . Arthritis   . CAD (coronary artery disease)    a. 2009 PCI/DES to mLAD; b. 05/2010 s/p PCI RCA and LAD.; c. 05/2013 Cath: LAD mild ISR, LCX nl, RCA 40p, patent stent.  . Cataract    has had lasik surg  . COPD (chronic obstructive pulmonary disease) ([place])   . Diverticulosis   . DJD (degenerative joint disease)   . Esophagitis 1991   grade 1  . GERD (gastroesophageal reflux disease)    Hiatal hernia  . Hemorrhoids   . Hyperlipidemia   . Hypertensive heart disease   . Iron deficiency anemia   . Migraines   . Paroxysmal atrial fibrillation (HCC)    a. s/p afib ablation 10/22/08 (J. Allred).  . PVC's (premature ventricular contractions)   . Sleep apnea    Questionalble: RDI during the total sleep time 6h 28 mins was 3.55/hr during REM sleep was at 5.71/hr. Supine AHI was 5.93/hr.   Past Surgical History:  Procedure Laterality Date  . CARDIAC CATHETERIZATION  05/2010  The proximal LAD was then predilated with 2.0 x 12 trek. this was then stented with a 2.5 x 16 promus Element drug-eluting stent at 14 atmosphere and prostdilated with 2.75 x 12 [place] trek at 16 atmospheres(2.8 mm) resulting the reduction of 80% proximal LAD stenosis to 0% residual with excellent flow.  Marland Kitchen CARDIAC CATHETERIZATION  06/05/2010   Successful percutaneous coronary intervention to the right coronary artery with percutaneous transluminal coronary angioplasty/stenting and insertion 3.0 x 16 mm Promus DES post dilated to 3.35 mm with stenosis being reduced to 0%  . CARDIAC CATHETERIZATION  02/2008   Post dilatation  was performed using a 2.75 x 9 [place] sprinter, 10 atmospheres for 40 seconds and then 9 atmosphere for 35 sec. This resulted in the 80% area of narrowing pre-intervention, now appearing to normal. There was no edvidence of the dissection or thrombus and there was TIMI III flow pre and post.  . CARDIAC CATHETERIZATION  02/2006  . CORONARY ANGIOPLASTY WITH STENT PLACEMENT     3 stents/ 4 caths  . EYE SURGERY    . KNEE ARTHROSCOPY     right  . LASIK    . LEFT HEART CATHETERIZATION WITH CORONARY ANGIOGRAM N/A 06/29/2013   Procedure: LEFT HEART CATHETERIZATION WITH CORONARY ANGIOGRAM;  Surgeon: Leonie Man, MD;  Location: [facility] CATH LAB;  Service: Cardiovascular;  Laterality: N/A;  . NASAL SINUS SURGERY  2001  . NECK SURGERY     Cervical fusion  . RADIOFREQUENCY ABLATION  May 2010   atrial fibrillation  . ROTATOR CUFF REPAIR     left  . SHOULDER OPEN ROTATOR CUFF REPAIR     right  . SPINE SURGERY    . WRIST ARTHROCENTESIS     left   Current Outpatient Prescriptions on File Prior to Visit  Medication Sig Dispense Refill  . amLODipine (NORVASC) 2.5 MG tablet Take 1 tablet (2.5 mg total) by mouth daily. 90 tablet 3  . aspirin 81 MG tablet Take 81 mg by mouth daily.    . carvedilol (COREG) 12.5 MG tablet 2 tab po qam; 1 tab po qpm 270 tablet 3  . cholecalciferol (VITAMIN D) 1000 UNITS tablet Take 1,000 Units by mouth daily.    . clopidogrel (PLAVIX) 75 MG tablet Take 1 tablet by mouth  every day 90 tablet 2  . Coenzyme Q10 (CO Q 10) 100 MG CAPS Take 1 capsule by mouth daily.    . fluticasone (FLONASE) 50 MCG/ACT nasal spray INSTILL 2 SPRAYS IN THE NOSE AT BEDTIME 48 g 3  . furosemide (LASIX) 40 MG tablet Take 1 tablet by mouth  daily 90 tablet 2  . nitroGLYCERIN (NITROSTAT) 0.4 MG SL tablet Place 1 tablet (0.4 mg total) under the tongue every 5 (five) minutes as needed for chest pain. Need ov. 7 tablet 0  . oxyCODONE-acetaminophen (PERCOCET) 10-325 MG tablet Take 1 tablet by mouth every 6 (six)  hours as needed for pain. 30 tablet 0  . pantoprazole (PROTONIX) 40 MG tablet Take 1 tablet by mouth  every day 90 tablet 2  . potassium chloride (MICRO-K) 10 MEQ CR capsule Take 1 capsule by mouth  every day 90 capsule 2  . rosuvastatin (CRESTOR) 40 MG tablet Take 1 tablet (40 mg total) by mouth daily. 30 tablet 3  . telmisartan (MICARDIS) 40 MG tablet Take 1 tablet by mouth  daily 90 tablet 1  . UNABLE TO FIND CPAP therapy    . venlafaxine XR (EFFEXOR XR) 75 MG 24  hr capsule Take 2 capsules (150 mg total) by mouth daily with breakfast. 60 capsule 2  . citalopram (CELEXA) 40 MG tablet Take 1 tablet (40 mg total) by mouth daily. (Patient not taking: Reported on 02/09/2016) 90 tablet 3  . zolpidem (AMBIEN) 10 MG tablet Take 1 tablet (10 mg total) by mouth at bedtime as needed (for sleep initiation). 30 tablet 2   No current facility-administered medications on file prior to visit.    Allergies  Allergen Reactions  . Ibuprofen Swelling  . Other Shortness Of Breath    BETA BLOCKERS  . Topamax [Topiramate] Other (See Comments)    Pt states it made his tongue feel "scalded" and he couldn't eat   . Toprol Xl [Metoprolol Succinate] Other (See Comments)    Personality change   Social History   Social History  . Marital status: Married    Spouse name: N/A  . Number of children: 2  . Years of education: N/A   Occupational History  . retired    Social History Main Topics  . Smoking status: Former Smoker    Quit date: 06/01/1987  . Smokeless tobacco: Never Used  . Alcohol use 0.0 oz/week     Comment: once every 6-7 months  . Drug use: No  . Sexual activity: Not on file   Other Topics Concern  . Not on file   Social History Narrative  . No narrative on file      Review of Systems  All other systems reviewed and are negative.      Objective:   Physical Exam  Neck: No JVD present. No thyromegaly present.  Cardiovascular: Normal rate, regular rhythm and normal heart sounds.     No murmur heard. Pulmonary/Chest: Effort normal and breath sounds normal. No respiratory distress. He has no wheezes. He has no rales.  Abdominal: Soft. Bowel sounds are normal.  Musculoskeletal: He exhibits no edema.  Vitals reviewed.         Assessment & Plan:  Depression  ASCVD (arteriosclerotic cardiovascular disease)  Dyspnea on exertion - Plan: DG Chest 2 View  Other fatigue - Plan: Testosterone  Patient did exceptionally well on his pulmonary function test. His FEV1 was 3.06 L which is 109% of predicted. His FVC was 3.85 L which is 102% of predicted. His FEV1 ratio was therefore 80% which is normal. There is no evidence of obstructive or restrictive lung disease. That being said, given his shortness of breath, I would recommend getting a baseline chest x-ray to rule out structural lesions within the lung. CBC was just checked in July and was normal. I will check a testosterone level given his fatigue. If all this is normal, I would recommend switching the patient from Effexor to Trintellix 10 mg a day. I will await the results of his labs and x-ray.

## 2016-02-10 ENCOUNTER — Telehealth: Payer: Self-pay | Admitting: *Deleted

## 2016-02-10 LAB — TESTOSTERONE: Testosterone: 186 ng/dL — ABNORMAL LOW (ref 250–827)

## 2016-02-10 NOTE — Telephone Encounter (Signed)
Patient called and is inquiring about his lab results. Patient is requesting a call back. His number is [phone]. Please advise  Thank you

## 2016-02-12 MED ORDER — BUPROPION HCL ER (XL) 150 MG PO TB24: 150.0000 mg | ORAL_TABLET | Freq: Every day | ORAL | 5 refills | Status: DC

## 2016-02-12 NOTE — Telephone Encounter (Signed)
Patient aware of results and med sent to pharm 

## 2016-03-11 ENCOUNTER — Ambulatory Visit (INDEPENDENT_AMBULATORY_CARE_PROVIDER_SITE_OTHER): Payer: 59 | Admitting: Family Medicine

## 2016-03-11 ENCOUNTER — Encounter: Payer: Self-pay | Admitting: Family Medicine

## 2016-03-11 VITALS — BP 110/60 | HR 80 | Temp 98.3°F | Resp 20 | Ht 68.0 in | Wt 278.0 lb

## 2016-03-11 DIAGNOSIS — F320 Major depressive disorder, single episode, mild: Secondary | ICD-10-CM | POA: Diagnosis not present

## 2016-03-11 DIAGNOSIS — R5383 Other fatigue: Secondary | ICD-10-CM | POA: Diagnosis not present

## 2016-03-11 DIAGNOSIS — R34 Anuria and oliguria: Secondary | ICD-10-CM | POA: Diagnosis not present

## 2016-03-11 NOTE — Progress Notes (Signed)
Subjective:    Patient ID: Lee [name], male    DOB: May 04, 1951, 65 y.o.   MRN: [id number]  HPI   01/08/16 Most recent cortisone injection lasted 6 months. He would like another today. He is again starting to get pain in the anterior and posterior aspects of the medial joint line. He reports sharp pain with ambulation. He reports pain under his kneecap going up and down steps. He defers an MRI of the present time as he is not willing to get surgery as long as the cortisone injections are working as well as they are. His other concern is worsening depression. He has been on Celexa 40 mg a day for about 5 years. Please see [place]. However he is now having worsening symptoms of depression including anhedonia, poor energy, poor sleeping. He reports depression. He reports poor concentration. He reports avoidance of crowds due to fear of being in crowds. He is interested in trying something different for his depression.  At that time, my plan was: Depression, discontinue citalopram. Replaced with Effexor XR 75 mg by mouth every morning and increase 250 mg by mouth every morning and one week. Recheck in 4-6 weeks  02/09/16 Patient has seen no benefit since switching from Celexa to Effexor. However today he states that he's having significant dyspnea on exertion. He is getting easily winded with very little activity. He also reports a daily cough. Patient had a stress test performed of his heart in April of this year which was completely normal with an ejection fraction of 55-65% and no ischemia. His last chest x-ray was in December 2015 and was normal. He has not had pulmonary function test. 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 He was exposed to a tremendous amount of stool or gas and also possible smoke. 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 He denies any hemoptysis. He denies any pleurisy. He also believes his possible shortness of  breath could be due to deconditioning. He also knows that he is overweight which could be also adding to his shortness of breath. In July checked all his lab work including a CBC which showed no evidence of anemia. TSH was normal. However he has not had a testosterone level checked. He also continues to complain of depression.  At that time, my plan was: Patient did exceptionally well on his pulmonary function test. His FEV1 was 3.06 L which is 109% of predicted. His FVC was 3.85 L which is 102% of predicted. His FEV1 ratio was therefore 80% which is normal. There is no evidence of obstructive or restrictive lung disease. That being said, given his shortness of breath, I would recommend getting a baseline chest x-ray to rule out structural lesions within the lung. CBC was just checked in July and was normal. I will check a testosterone level given his fatigue. If all this is normal, I would recommend switching the patient from Effexor to Trintellix 10 mg a day. I will await the results of his labs and x-ray.  03/11/16 The patient's depression is doing much better since switching to Trintellix.  However he continues to complain of severe fatigue. Chest x-ray was normal however his lab work was significant for a testosterone level of 186 which is extremely low. He is also been treating a cold recently with Benadryl as well as Alka-Seltzer plus. While doing this he has developed some urinary hesitancy. He feels the  pressure like he needs to avoid that and he has a weak stream. He is also been taking cold medication that has Sudafed in it that he has been unaware of  Past Medical History:  Diagnosis Date  . Allergic rhinitis    chronic sinusitis  . Arthritis   . CAD (coronary artery disease)    a. 2009 PCI/DES to mLAD; b. 05/2010 s/p PCI RCA and LAD.; c. 05/2013 Cath: LAD mild ISR, LCX nl, RCA 40p, patent stent.  . Cataract    has had lasik surg  . COPD (chronic obstructive pulmonary disease) ([place])   .  Diverticulosis   . DJD (degenerative joint disease)   . Esophagitis 1991   grade 1  . GERD (gastroesophageal reflux disease)    Hiatal hernia  . Hemorrhoids   . Hyperlipidemia   . Hypertensive heart disease   . Iron deficiency anemia   . Migraines   . Paroxysmal atrial fibrillation (HCC)    a. s/p afib ablation 10/22/08 (J. Allred).  . PVC's (premature ventricular contractions)   . Sleep apnea    Questionalble: RDI during the total sleep time 6h 28 mins was 3.55/hr during REM sleep was at 5.71/hr. Supine AHI was 5.93/hr.   Past Surgical History:  Procedure Laterality Date  . CARDIAC CATHETERIZATION  05/2010   The proximal LAD was then predilated with 2.0 x 12 trek. this was then stented with a 2.5 x 16 promus Element drug-eluting stent at 14 atmosphere and prostdilated with 2.75 x 12 [place] trek at 16 atmospheres(2.8 mm) resulting the reduction of 80% proximal LAD stenosis to 0% residual with excellent flow.  Marland Kitchen CARDIAC CATHETERIZATION  06/05/2010   Successful percutaneous coronary intervention to the right coronary artery with percutaneous transluminal coronary angioplasty/stenting and insertion 3.0 x 16 mm Promus DES post dilated to 3.35 mm with stenosis being reduced to 0%  . CARDIAC CATHETERIZATION  02/2008   Post dilatation was performed using a 2.75 x 9 [place] sprinter, 10 atmospheres for 40 seconds and then 9 atmosphere for 35 sec. This resulted in the 80% area of narrowing pre-intervention, now appearing to normal. There was no edvidence of the dissection or thrombus and there was TIMI III flow pre and post.  . CARDIAC CATHETERIZATION  02/2006  . CORONARY ANGIOPLASTY WITH STENT PLACEMENT     3 stents/ 4 caths  . EYE SURGERY    . KNEE ARTHROSCOPY     right  . LASIK    . LEFT HEART CATHETERIZATION WITH CORONARY ANGIOGRAM N/A 06/29/2013   Procedure: LEFT HEART CATHETERIZATION WITH CORONARY ANGIOGRAM;  Surgeon: Leonie Man, MD;  Location: [facility] C[facility] LAB;  Service: Cardiovascular;   Laterality: N/A;  . NASAL SINUS SURGERY  2001  . NECK SURGERY     Cervical fusion  . RADIOFREQUENCY ABLATION  May 2010   atrial fibrillation  . ROTATOR CUFF REPAIR     left  . SHOULDER OPEN ROTATOR CUFF REPAIR     right  . SPINE SURGERY    . WRIST ARTHROCENTESIS     left   Current Outpatient Prescriptions on File Prior to Visit  Medication Sig Dispense Refill  . amLODipine (NORVASC) 2.5 MG tablet Take 1 tablet (2.5 mg total) by mouth daily. 90 tablet 3  . aspirin 81 MG tablet Take 81 mg by mouth daily.    . carvedilol (COREG) 12.5 MG tablet 2 tab po qam; 1 tab po qpm 270 tablet 3  . cholecalciferol (VITAMIN D) 1000 UNITS tablet  Take 1,000 Units by mouth daily.    . citalopram (CELEXA) 40 MG tablet Take 1 tablet (40 mg total) by mouth daily. 90 tablet 3  . clopidogrel (PLAVIX) 75 MG tablet Take 1 tablet by mouth  every day 90 tablet 2  . Coenzyme Q10 (CO Q 10) 100 MG CAPS Take 1 capsule by mouth daily.    . fluticasone (FLONASE) 50 MCG/ACT nasal spray INSTILL 2 SPRAYS IN THE NOSE AT BEDTIME 48 g 3  . furosemide (LASIX) 40 MG tablet Take 1 tablet by mouth  daily 90 tablet 2  . nitroGLYCERIN (NITROSTAT) 0.4 MG SL tablet Place 1 tablet (0.4 mg total) under the tongue every 5 (five) minutes as needed for chest pain. Need ov. 7 tablet 0  . oxyCODONE-acetaminophen (PERCOCET) 10-325 MG tablet Take 1 tablet by mouth every 6 (six) hours as needed for pain. 30 tablet 0  . pantoprazole (PROTONIX) 40 MG tablet Take 1 tablet by mouth  every day 90 tablet 2  . potassium chloride (MICRO-K) 10 MEQ CR capsule Take 1 capsule by mouth  every day 90 capsule 2  . telmisartan (MICARDIS) 40 MG tablet Take 1 tablet by mouth  daily 90 tablet 1  . UNABLE TO FIND CPAP therapy    . venlafaxine XR (EFFEXOR XR) 75 MG 24 hr capsule Take 2 capsules (150 mg total) by mouth daily with breakfast. 60 capsule 2  . buPROPion (WELLBUTRIN XL) 150 MG 24 hr tablet Take 1 tablet (150 mg total) by mouth daily. (Patient not  taking: Reported on 03/11/2016) 30 tablet 5  . rosuvastatin (CRESTOR) 40 MG tablet Take 1 tablet (40 mg total) by mouth daily. 30 tablet 3  . zolpidem (AMBIEN) 10 MG tablet Take 1 tablet (10 mg total) by mouth at bedtime as needed (for sleep initiation). 30 tablet 2   No current facility-administered medications on file prior to visit.    Allergies  Allergen Reactions  . Ibuprofen Swelling  . Other Shortness Of Breath    BETA BLOCKERS  . Topamax [Topiramate] Other (See Comments)    Pt states it made his tongue feel "scalded" and he couldn't eat   . Toprol Xl [Metoprolol Succinate] Other (See Comments)    Personality change   Social History   Social History  . Marital status: Married    Spouse name: N/A  . Number of children: 2  . Years of education: N/A   Occupational History  . retired    Social History Main Topics  . Smoking status: Former Smoker    Quit date: 06/01/1987  . Smokeless tobacco: Never Used  . Alcohol use 0.0 oz/week     Comment: once every 6-7 months  . Drug use: No  . Sexual activity: Not on file   Other Topics Concern  . Not on file   Social History Narrative  . No narrative on file      Review of Systems  All other systems reviewed and are negative.      Objective:   Physical Exam  Neck: No JVD present. No thyromegaly present.  Cardiovascular: Normal rate, regular rhythm and normal heart sounds.   No murmur heard. Pulmonary/Chest: Effort normal and breath sounds normal. No respiratory distress. He has no wheezes. He has no rales.  Abdominal: Soft. Bowel sounds are normal.  Musculoskeletal: He exhibits no edema.  Vitals reviewed.         Assessment & Plan:  Urine volume deficient - Plan: Urinalysis, Routine w reflex  microscopic (not at [facility])  Mild single current episode of major depressive disorder ([place])  Other fatigue  His depression is better controlled on the new medication. Continue this for the present time. I believe his  fatigue is likely due to low testosterone. After discussing the risk and benefits, the patient elects not to take testosterone replacement. I believe his urinary hesitancy is due to Sudafed use. I recommended discontinuing Benadryl and Sudafed and Alka-Seltzer plus and recheck next week. If symptoms persist, I will treat the patient has prostatitis with Flomax and Cipro.

## 2016-03-17 ENCOUNTER — Other Ambulatory Visit: Payer: Self-pay | Admitting: Cardiovascular Disease

## 2016-03-18 NOTE — Telephone Encounter (Signed)
REFILL 

## 2016-03-19 ENCOUNTER — Encounter: Payer: Self-pay | Admitting: Family Medicine

## 2016-03-19 ENCOUNTER — Ambulatory Visit (INDEPENDENT_AMBULATORY_CARE_PROVIDER_SITE_OTHER): Payer: 59 | Admitting: Family Medicine

## 2016-03-19 VITALS — BP 110/68 | HR 84 | Temp 98.1°F | Resp 20 | Ht 68.0 in | Wt 278.0 lb

## 2016-03-19 DIAGNOSIS — J019 Acute sinusitis, unspecified: Secondary | ICD-10-CM

## 2016-03-19 MED ORDER — AMOXICILLIN-POT CLAVULANATE 875-125 MG PO TABS: 1.0000 | ORAL_TABLET | Freq: Two times a day (BID) | ORAL | 0 refills | Status: DC

## 2016-03-19 MED ORDER — HYDROCODONE-HOMATROPINE 5-1.5 MG/5ML PO SYRP: 5.0000 mL | ORAL_SOLUTION | Freq: Three times a day (TID) | ORAL | 0 refills | Status: DC | PRN

## 2016-03-19 NOTE — Progress Notes (Signed)
Subjective:    Patient ID: Lee [name], male    DOB: [birth year], 65 y.o.   MRN: [id number]  HPI   01/08/16 Most recent cortisone injection lasted 6 months. He would like another today. He is again starting to get pain in the anterior and posterior aspects of the medial joint line. He reports sharp pain with ambulation. He reports pain under his kneecap going up and down steps. He defers an MRI of the present time as he is not willing to get surgery as long as the cortisone injections are working as well as they are. His other concern is worsening depression. He has been on Celexa 40 mg a day for about 5 years. Please see [facility]. However he is now having worsening symptoms of depression including anhedonia, poor energy, poor sleeping. He reports depression. He reports poor concentration. He reports avoidance of crowds due to fear of being in crowds. He is interested in trying something different for his depression.  At that time, my plan was: Depression, discontinue citalopram. Replaced with Effexor XR 75 mg by mouth every morning and increase 250 mg by mouth every morning and one week. Recheck in 4-6 weeks  02/09/16 Patient has seen no benefit since switching from Celexa to Effexor. However today he states that he's having significant dyspnea on exertion. He is getting easily winded with very little activity. He also reports a daily cough. Patient had a stress test performed of his heart in April of this year which was completely normal with an ejection fraction of 55-65% and no ischemia. His last chest x-ray was in December 2015 and was normal. He has not had pulmonary function test. 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 He was exposed to a tremendous amount of stool or gas and also possible smoke. 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 He denies any hemoptysis. He denies any pleurisy. He also believes his possible shortness of  breath could be due to deconditioning. He also knows that he is overweight which could be also adding to his shortness of breath. In July checked all his lab work including a CBC which showed no evidence of anemia. TSH was normal. However he has not had a testosterone level checked. He also continues to complain of depression.  At that time, my plan was: Patient did exceptionally well on his pulmonary function test. His FEV1 was 3.06 L which is 109% of predicted. His FVC was 3.85 L which is 102% of predicted. His FEV1 ratio was therefore 80% which is normal. There is no evidence of obstructive or restrictive lung disease. That being said, given his shortness of breath, I would recommend getting a baseline chest x-ray to rule out structural lesions within the lung. CBC was just checked in July and was normal. I will check a testosterone level given his fatigue. If all this is normal, I would recommend switching the patient from Effexor to Trintellix 10 mg a day. I will await the results of his labs and x-ray.  03/11/16 The patient's depression is doing much better since switching to Trintellix.  However he continues to complain of severe fatigue. Chest x-ray was normal however his lab work was significant for a testosterone level of 186 which is extremely low. He is also been treating a cold recently with Benadryl as well as Alka-Seltzer plus. While doing this he has developed some urinary hesitancy. He feels the  pressure like he needs to avoid that and he has a weak stream. He is also been taking cold medication that has Sudafed in it that he has been unaware of.  At that time, my plan was: His depression is better controlled on the new medication. Continue this for the present time. I believe his fatigue is likely due to low testosterone. After discussing the risk and benefits, the patient elects not to take testosterone replacement. I believe his urinary hesitancy is due to Sudafed use. I recommended  discontinuing Benadryl and Sudafed and Alka-Seltzer plus and recheck next week. If symptoms persist, I will treat the patient has prostatitis with Flomax and Cipro.  03/19/16 patient's respiratory symptoms have only worsened. He reports a constant cough keeping him awake at night. However his primary concern is now having worsening fevers, sinus headaches, sinus congestion, postnasal drip, rhinorrhea, diffuse body aches. He is tender to palpation now in his frontal sinuses bilaterally. He has significant head congestion on exam. I believe that his initial viral upper respiratory symptoms have now turned into a secondary sinus infection Past Medical History:  Diagnosis Date  . Allergic rhinitis    chronic sinusitis  . Arthritis   . CAD (coronary artery disease)    a. 2009 PCI/DES to mLAD; b. 05/2010 s/p PCI RCA and LAD.; c. 05/2013 Cath: LAD mild ISR, LCX nl, RCA 40p, patent stent.  . Cataract    has had lasik surg  . COPD (chronic obstructive pulmonary disease) ([place])   . Diverticulosis   . DJD (degenerative joint disease)   . Esophagitis 1991   grade 1  . GERD (gastroesophageal reflux disease)    Hiatal hernia  . Hemorrhoids   . Hyperlipidemia   . Hypertensive heart disease   . Iron deficiency anemia   . Migraines   . Paroxysmal atrial fibrillation (HCC)    a. s/p afib ablation 10/22/08 (J. [facility]).  . PVC's (premature ventricular contractions)   . Sleep apnea    Questionalble: RDI during the total sleep time 6h 28 mins was 3.55/hr during REM sleep was at 5.71/hr. Supine AHI was 5.93/hr.   Past Surgical History:  Procedure Laterality Date  . CARDIAC CATHETERIZATION  05/2010   The proximal LAD was then predilated with 2.0 x 12 trek. this was then stented with a 2.5 x 16 promus Element drug-eluting stent at 14 atmosphere and prostdilated with 2.75 x 12 [place] trek at 16 atmospheres(2.8 mm) resulting the reduction of 80% proximal LAD stenosis to 0% residual with excellent flow.  Marland Kitchen CARDIAC  CATHETERIZATION  06/05/2010   Successful percutaneous coronary intervention to the right coronary artery with percutaneous transluminal coronary angioplasty/stenting and insertion 3.0 x 16 mm Promus DES post dilated to 3.35 mm with stenosis being reduced to 0%  . CARDIAC CATHETERIZATION  02/2008   Post dilatation was performed using a 2.75 x 9  sprinter, 10 atmospheres for 40 seconds and then 9 atmosphere for 35 sec. This resulted in the 80% area of narrowing pre-intervention, now appearing to normal. There was no edvidence of the dissection or thrombus and there was TIMI III flow pre and post.  . CARDIAC CATHETERIZATION  02/2006  . CORONARY ANGIOPLASTY WITH STENT PLACEMENT     3 stents/ 4 caths  . EYE SURGERY    . KNEE ARTHROSCOPY     right  . LASIK    . LEFT HEART CATHETERIZATION WITH CORONARY ANGIOGRAM N/A 06/29/2013   Procedure: LEFT HEART CATHETERIZATION WITH CORONARY ANGIOGRAM;  Surgeon:  Leonie Man, MD;  Location: [facility] CATH LAB;  Service: Cardiovascular;  Laterality: N/A;  . NASAL SINUS SURGERY  2001  . NECK SURGERY     Cervical fusion  . RADIOFREQUENCY ABLATION  May 2010   atrial fibrillation  . ROTATOR CUFF REPAIR     left  . SHOULDER OPEN ROTATOR CUFF REPAIR     right  . SPINE SURGERY    . WRIST ARTHROCENTESIS     left   Current Outpatient Prescriptions on File Prior to Visit  Medication Sig Dispense Refill  . amLODipine (NORVASC) 2.5 MG tablet TAKE 1 TABLET BY MOUTH  DAILY 90 tablet 3  . aspirin 81 MG tablet Take 81 mg by mouth daily.    Marland Kitchen buPROPion (WELLBUTRIN XL) 150 MG 24 hr tablet Take 1 tablet (150 mg total) by mouth daily. (Patient not taking: Reported on 03/11/2016) 30 tablet 5  . carvedilol (COREG) 12.5 MG tablet 2 tab po qam; 1 tab po qpm 270 tablet 3  . cholecalciferol (VITAMIN D) 1000 UNITS tablet Take 1,000 Units by mouth daily.    . citalopram (CELEXA) 40 MG tablet Take 1 tablet (40 mg total) by mouth daily. 90 tablet 3  . clopidogrel (PLAVIX) 75 MG tablet  Take 1 tablet by mouth  every day 90 tablet 2  . Coenzyme Q10 (CO Q 10) 100 MG CAPS Take 1 capsule by mouth daily.    . fluticasone (FLONASE) 50 MCG/ACT nasal spray INSTILL 2 SPRAYS IN THE NOSE AT BEDTIME 48 g 3  . furosemide (LASIX) 40 MG tablet Take 1 tablet by mouth  daily 90 tablet 2  . nitroGLYCERIN (NITROSTAT) 0.4 MG SL tablet Place 1 tablet (0.4 mg total) under the tongue every 5 (five) minutes as needed for chest pain. Need ov. 7 tablet 0  . oxyCODONE-acetaminophen (PERCOCET) 10-325 MG tablet Take 1 tablet by mouth every 6 (six) hours as needed for pain. 30 tablet 0  . pantoprazole (PROTONIX) 40 MG tablet Take 1 tablet by mouth  every day 90 tablet 2  . potassium chloride (MICRO-K) 10 MEQ CR capsule Take 1 capsule by mouth  every day 90 capsule 2  . rosuvastatin (CRESTOR) 40 MG tablet Take 1 tablet (40 mg total) by mouth daily. 30 tablet 3  . telmisartan (MICARDIS) 40 MG tablet TAKE 1 TABLET BY MOUTH  DAILY 90 tablet 3  . UNABLE TO FIND CPAP therapy    . venlafaxine XR (EFFEXOR XR) 75 MG 24 hr capsule Take 2 capsules (150 mg total) by mouth daily with breakfast. 60 capsule 2  . zolpidem (AMBIEN) 10 MG tablet Take 1 tablet (10 mg total) by mouth at bedtime as needed (for sleep initiation). 30 tablet 2   No current facility-administered medications on file prior to visit.    Allergies  Allergen Reactions  . Ibuprofen Swelling  . Other Shortness Of Breath    BETA BLOCKERS  . Topamax [Topiramate] Other (See Comments)    Pt states it made his tongue feel "scalded" and he couldn't eat   . Toprol Xl [Metoprolol Succinate] Other (See Comments)    Personality change   Social History   Social History  . Marital status: Married    Spouse name: N/A  . Number of children: 2  . Years of education: N/A   Occupational History  . retired    Social History Main Topics  . Smoking status: Former Smoker    Quit date: 06/01/1987  . Smokeless tobacco: Never Used  .  Alcohol use 0.0 oz/week      Comment: once every 6-7 months  . Drug use: No  . Sexual activity: Not on file   Other Topics Concern  . Not on file   Social History Narrative  . No narrative on file      Review of Systems  All other systems reviewed and are negative.      Objective:   Physical Exam  HENT:  Nose: Mucosal edema and rhinorrhea present. Right sinus exhibits frontal sinus tenderness. Left sinus exhibits frontal sinus tenderness.  Neck: No JVD present. No thyromegaly present.  Cardiovascular: Normal rate, regular rhythm and normal heart sounds.   No murmur heard. Pulmonary/Chest: Effort normal and breath sounds normal. No respiratory distress. He has no wheezes. He has no rales.  Abdominal: Soft. Bowel sounds are normal.  Musculoskeletal: He exhibits no edema.  Vitals reviewed.         Assessment & Plan:  Acute rhinosinusitis - Plan: amoxicillin-clavulanate (AUGMENTIN) 875-125 MG tablet  Secondary sinus infection. Treat with Augmentin 875 mg by mouth twice a day for 10 days. Use Hycodan 1 teaspoon every 8 hours as needed for cough. Recheck in one week if no better or sooner if worse

## 2016-04-07 ENCOUNTER — Telehealth: Payer: Self-pay | Admitting: Family Medicine

## 2016-04-07 DIAGNOSIS — G43009 Migraine without aura, not intractable, without status migrainosus: Secondary | ICD-10-CM

## 2016-04-07 MED ORDER — OXYCODONE-ACETAMINOPHEN 10-325 MG PO TABS: 1.0000 | ORAL_TABLET | Freq: Four times a day (QID) | ORAL | 0 refills | Status: DC | PRN

## 2016-04-07 NOTE — Telephone Encounter (Signed)
Requesting refill on Oxycodone - Ok to refill??

## 2016-04-08 ENCOUNTER — Other Ambulatory Visit: Payer: Self-pay | Admitting: Family Medicine

## 2016-04-08 NOTE — Telephone Encounter (Signed)
ok 

## 2016-04-08 NOTE — Telephone Encounter (Signed)
RX printed, left up front and patient aware to pick up after 2 pm today 

## 2016-05-04 ENCOUNTER — Ambulatory Visit (INDEPENDENT_AMBULATORY_CARE_PROVIDER_SITE_OTHER): Payer: 59 | Admitting: Family Medicine

## 2016-05-04 ENCOUNTER — Encounter: Payer: Self-pay | Admitting: Family Medicine

## 2016-05-04 VITALS — BP 146/80 | HR 82 | Temp 98.5°F | Resp 18 | Ht 68.0 in | Wt 279.0 lb

## 2016-05-04 DIAGNOSIS — I499 Cardiac arrhythmia, unspecified: Secondary | ICD-10-CM

## 2016-05-04 DIAGNOSIS — F321 Major depressive disorder, single episode, moderate: Secondary | ICD-10-CM

## 2016-05-04 MED ORDER — DICLOFENAC SODIUM 1 % TD GEL: 2.0000 g | Freq: Four times a day (QID) | TRANSDERMAL | 1 refills | Status: DC

## 2016-05-04 MED ORDER — CARVEDILOL 12.5 MG PO TABS: 25.0000 mg | ORAL_TABLET | Freq: Two times a day (BID) | ORAL | 3 refills | Status: DC

## 2016-05-04 NOTE — Progress Notes (Signed)
Subjective:    Patient ID: Lee [name], male    DOB: 1950/09/25, 65 y.o.   MRN: [id number]  HPI   01/08/16 Most recent cortisone injection lasted 6 months. He would like another today. He is again starting to get pain in the anterior and posterior aspects of the medial joint line. He reports sharp pain with ambulation. He reports pain under his kneecap going up and down steps. He defers an MRI of the present time as he is not willing to get surgery as long as the cortisone injections are working as well as they are. His other concern is worsening depression. He has been on Celexa 40 mg a day for about 5 years. Please see [facility]. However he is now having worsening symptoms of depression including anhedonia, poor energy, poor sleeping. He reports depression. He reports poor concentration. He reports avoidance of crowds due to fear of being in crowds. He is interested in trying something different for his depression.  At that time, my plan was: Depression, discontinue citalopram. Replaced with Effexor XR 75 mg by mouth every morning and increase 250 mg by mouth every morning and one week. Recheck in 4-6 weeks  02/09/16 Patient has seen no benefit since switching from Celexa to Effexor. However today he states that he's having significant dyspnea on exertion. He is getting easily winded with very little activity. He also reports a daily cough. Patient had a stress test performed of his heart in April of this year which was completely normal with an ejection fraction of 55-65% and no ischemia. His last chest x-ray was in December 2015 and was normal. He has not had pulmonary function test. 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 He was exposed to a tremendous amount of stool or gas and also possible smoke. 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 He denies any hemoptysis. He denies any pleurisy. He also believes his possible shortness of  breath could be due to deconditioning. He also knows that he is overweight which could be also adding to his shortness of breath. In July checked all his lab work including a CBC which showed no evidence of anemia. TSH was normal. However he has not had a testosterone level checked. He also continues to complain of depression.  At that time, my plan was:  Patient did exceptionally well on his pulmonary function test. His FEV1 was 3.06 L which is 109% of predicted. His FVC was 3.85 L which is 102% of predicted. His FEV1 ratio was therefore 80% which is normal. There is no evidence of obstructive or restrictive lung disease. That being said, given his shortness of breath, I would recommend getting a baseline chest x-ray to rule out structural lesions within the lung. CBC was just checked in July and was normal. I will check a testosterone level given his fatigue. If all this is normal, I would recommend switching the patient from Effexor to Trintellix 10 mg a day. I will await the results of his labs and x-ray.  05/04/16 Labs did reveal low testosterone. At that time I started the patient on Wellbutrin in addition to Effexor for depression mainly due to cost. He discontinued the medication due to nausea after less than a month he is here today complaining of worsening depression and anhedonia and sadness. He is also reporting palpitations. He has a history of SVT status post ablation and paroxysmal atrial fibrillation.  On his exam today he has normal sinus rhythm on exam although he does have occasional irregular heartbeats. This was captured on 3 separate occasions on EKG. He had 2 PVCs in close proximity. Otherwise he shows his right bundle branch block with inferior Q waves in lead 3 and aVF that are chronic. He admits that he is under more stress and not sleeping well and consuming more caffeine. He is also been taking BC powders due to arthralgias in his hands particularly the MCP joints and PIP  joints  Past Medical History:  Diagnosis Date  . Allergic rhinitis    chronic sinusitis  . Arthritis   . CAD (coronary artery disease)    a. 2009 PCI/DES to mLAD; b. 05/2010 s/p PCI RCA and LAD.; c. 05/2013 Cath: LAD mild ISR, LCX nl, RCA 40p, patent stent.  . Cataract    has had lasik surg  . COPD (chronic obstructive pulmonary disease) ([place])   . Diverticulosis   . DJD (degenerative joint disease)   . Esophagitis 1991   grade 1  . GERD (gastroesophageal reflux disease)    Hiatal hernia  . Hemorrhoids   . Hyperlipidemia   . Hypertensive heart disease   . Iron deficiency anemia   . Migraines   . Paroxysmal atrial fibrillation (HCC)    a. s/p afib ablation 10/22/08 ([facility]. [facility]).  . PVC's (premature ventricular contractions)   . Sleep apnea    Questionalble: RDI during the total sleep time 6h 28 mins was 3.55/hr during REM sleep was at 5.71/hr. Supine AHI was 5.93/hr.   Past Surgical History:  Procedure Laterality Date  . CARDIAC CATHETERIZATION  05/2010   The proximal LAD was then predilated with 2.0 x 12 trek. this was then stented with a 2.5 x 16 promus Element drug-eluting stent at 14 atmosphere and prostdilated with 2.75 x 12 [place] trek at 16 atmospheres(2.8 mm) resulting the reduction of 80% proximal LAD stenosis to 0% residual with excellent flow.  Marland Kitchen CARDIAC CATHETERIZATION  06/05/2010   Successful percutaneous coronary intervention to the right coronary artery with percutaneous transluminal coronary angioplasty/stenting and insertion 3.0 x 16 mm Promus DES post dilated to 3.35 mm with stenosis being reduced to 0%  . CARDIAC CATHETERIZATION  02/2008   Post dilatation was performed using a 2.75 x 9 [place] sprinter, 10 atmospheres for 40 seconds and then 9 atmosphere for 35 sec. This resulted in the 80% area of narrowing pre-intervention, now appearing to normal. There was no edvidence of the dissection or thrombus and there was TIMI III flow pre and post.  . CARDIAC CATHETERIZATION   02/2006  . CORONARY ANGIOPLASTY WITH STENT PLACEMENT     3 stents/ 4 caths  . EYE SURGERY    . KNEE ARTHROSCOPY     right  . LASIK    . LEFT HEART CATHETERIZATION WITH CORONARY ANGIOGRAM N/A 06/29/2013   Procedure: LEFT HEART CATHETERIZATION WITH CORONARY ANGIOGRAM;  Surgeon: Leonie Man, MD;  Location: [facility] CATH LAB;  Service: Cardiovascular;  Laterality: N/A;  . NASAL SINUS SURGERY  2001  . NECK SURGERY     Cervical fusion  . RADIOFREQUENCY ABLATION  May 2010   atrial fibrillation  . ROTATOR CUFF REPAIR     left  . SHOULDER OPEN ROTATOR CUFF REPAIR     right  . SPINE SURGERY    . WRIST ARTHROCENTESIS     left   Current Outpatient Prescriptions on File Prior to Visit  Medication Sig Dispense Refill  .  amLODipine (NORVASC) 2.5 MG tablet TAKE 1 TABLET BY MOUTH  DAILY 90 tablet 3  . amoxicillin-clavulanate (AUGMENTIN) 875-125 MG tablet Take 1 tablet by mouth 2 (two) times daily. 20 tablet 0  . aspirin 81 MG tablet Take 81 mg by mouth daily.    . carvedilol (COREG) 12.5 MG tablet 2 tab po qam; 1 tab po qpm 270 tablet 3  . cholecalciferol (VITAMIN D) 1000 UNITS tablet Take 1,000 Units by mouth daily.    . citalopram (CELEXA) 40 MG tablet Take 1 tablet (40 mg total) by mouth daily. 90 tablet 3  . clopidogrel (PLAVIX) 75 MG tablet Take 1 tablet by mouth  every day 90 tablet 2  . Coenzyme Q10 (CO Q 10) 100 MG CAPS Take 1 capsule by mouth daily.    . fluticasone (FLONASE) 50 MCG/ACT nasal spray INSTILL 2 SPRAYS IN THE NOSE AT BEDTIME 48 g 3  . furosemide (LASIX) 40 MG tablet Take 1 tablet by mouth  daily 90 tablet 2  . HYDROcodone-homatropine (HYCODAN) 5-1.5 MG/5ML syrup Take 5 mLs by mouth every 8 (eight) hours as needed for cough. 120 mL 0  . nitroGLYCERIN (NITROSTAT) 0.4 MG SL tablet Place 1 tablet (0.4 mg total) under the tongue every 5 (five) minutes as needed for chest pain. Need ov. 7 tablet 0  . oxyCODONE-acetaminophen (PERCOCET) 10-325 MG tablet Take 1 tablet by mouth every 6  (six) hours as needed for pain. 30 tablet 0  . pantoprazole (PROTONIX) 40 MG tablet Take 1 tablet by mouth  every day 90 tablet 2  . potassium chloride (MICRO-K) 10 MEQ CR capsule Take 1 capsule by mouth  every day 90 capsule 2  . telmisartan (MICARDIS) 40 MG tablet TAKE 1 TABLET BY MOUTH  DAILY 90 tablet 3  . UNABLE TO FIND CPAP therapy    . venlafaxine XR (EFFEXOR-XR) 75 MG 24 hr capsule TAKE 2 CAPSULES(150 MG) BY MOUTH DAILY WITH BREAKFAST 60 capsule 0  . rosuvastatin (CRESTOR) 40 MG tablet Take 1 tablet (40 mg total) by mouth daily. 30 tablet 3  . zolpidem (AMBIEN) 10 MG tablet Take 1 tablet (10 mg total) by mouth at bedtime as needed (for sleep initiation). 30 tablet 2   No current facility-administered medications on file prior to visit.    Allergies  Allergen Reactions  . Ibuprofen Swelling  . Other Shortness Of Breath    BETA BLOCKERS  . Topamax [Topiramate] Other (See Comments)    Pt states it made his tongue feel "scalded" and he couldn't eat   . Toprol Xl [Metoprolol Succinate] Other (See Comments)    Personality change   Social History   Social History  . Marital status: Married    Spouse name: N/A  . Number of children: 2  . Years of education: N/A   Occupational History  . retired    Social History Main Topics  . Smoking status: Former Smoker    Quit date: 06/01/1987  . Smokeless tobacco: Never Used  . Alcohol use 0.0 oz/week     Comment: once every 6-7 months  . Drug use: No  . Sexual activity: Not on file   Other Topics Concern  . Not on file   Social History Narrative  . No narrative on file      Review of Systems  All other systems reviewed and are negative.      Objective:   Physical Exam  Neck: No JVD present. No thyromegaly present.  Cardiovascular: Normal rate,  regular rhythm and normal heart sounds.   No murmur heard. Pulmonary/Chest: Effort normal and breath sounds normal. No respiratory distress. He has no wheezes. He has no rales.   Abdominal: Soft. Bowel sounds are normal.  Musculoskeletal: He exhibits no edema.  Vitals reviewed.         Assessment & Plan:  Irregular heart rate - Plan: EKG 12-Lead, EKG 12-Lead  Moderate single current episode of major depressive disorder (HCC) Palpitations seem to be PVCs. Increase carvedilol to 25 mg twice a day and recheck in one month. Discontinue Effexor due to ineffectiveness. Replace with Trintellix 5 mg a day and increased to 10 mg a day in one week and then recheck in one month. Used (gel 2 g 4 times a day for arthralgias in his hand joints. Avoid NSAIDs and caffeine due to his history of ASCVD as well as his palpitations

## 2016-05-06 ENCOUNTER — Other Ambulatory Visit: Payer: Self-pay | Admitting: Cardiovascular Disease

## 2016-05-13 ENCOUNTER — Encounter: Payer: Self-pay | Admitting: Family Medicine

## 2016-05-13 ENCOUNTER — Ambulatory Visit (INDEPENDENT_AMBULATORY_CARE_PROVIDER_SITE_OTHER): Payer: 59 | Admitting: Family Medicine

## 2016-05-13 VITALS — BP 90/60 | HR 80 | Temp 98.4°F | Resp 20 | Ht 68.0 in | Wt 277.0 lb

## 2016-05-13 DIAGNOSIS — F321 Major depressive disorder, single episode, moderate: Secondary | ICD-10-CM | POA: Diagnosis not present

## 2016-05-13 DIAGNOSIS — R031 Nonspecific low blood-pressure reading: Secondary | ICD-10-CM

## 2016-05-13 DIAGNOSIS — I499 Cardiac arrhythmia, unspecified: Secondary | ICD-10-CM | POA: Diagnosis not present

## 2016-05-13 MED ORDER — CITALOPRAM HYDROBROMIDE 40 MG PO TABS: 40.0000 mg | ORAL_TABLET | Freq: Every day | ORAL | 3 refills | Status: DC

## 2016-05-13 MED ORDER — FLUTICASONE PROPIONATE 50 MCG/ACT NA SUSP: NASAL | 3 refills | Status: DC

## 2016-05-13 NOTE — Progress Notes (Signed)
Subjective:    Patient ID: Lee [name], male    DOB: April 30, 1951, 65 y.o.   MRN: [id number]  HPI   01/08/16 Most recent cortisone injection lasted 6 months. He would like another today. He is again starting to get pain in the anterior and posterior aspects of the medial joint line. He reports sharp pain with ambulation. He reports pain under his kneecap going up and down steps. He defers an MRI of the present time as he is not willing to get surgery as long as the cortisone injections are working as well as they are. His other concern is worsening depression. He has been on Celexa 40 mg a day for about 5 years. Please see [place]. However he is now having worsening symptoms of depression including anhedonia, poor energy, poor sleeping. He reports depression. He reports poor concentration. He reports avoidance of crowds due to fear of being in crowds. He is interested in trying something different for his depression.  At that time, my plan was: Depression, discontinue citalopram. Replaced with Effexor XR 75 mg by mouth every morning and increase 250 mg by mouth every morning and one week. Recheck in 4-6 weeks  02/09/16 Patient has seen no benefit since switching from Celexa to Effexor. However today he states that he's having significant dyspnea on exertion. He is getting easily winded with very little activity. He also reports a daily cough. Patient had a stress test performed of his heart in April of this year which was completely normal with an ejection fraction of 55-65% and no ischemia. His last chest x-ray was in December 2015 and was normal. He has not had pulmonary function test. 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 He was exposed to a tremendous amount of stool or gas and also possible smoke. 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 He denies any hemoptysis. He denies any pleurisy. He also believes his possible shortness of  breath could be due to deconditioning. He also knows that he is overweight which could be also adding to his shortness of breath. In July checked all his lab work including a CBC which showed no evidence of anemia. TSH was normal. However he has not had a testosterone level checked. He also continues to complain of depression.  At that time, my plan was:  Patient did exceptionally well on his pulmonary function test. His FEV1 was 3.06 L which is 109% of predicted. His FVC was 3.85 L which is 102% of predicted. His FEV1 ratio was therefore 80% which is normal. There is no evidence of obstructive or restrictive lung disease. That being said, given his shortness of breath, I would recommend getting a baseline chest x-ray to rule out structural lesions within the lung. CBC was just checked in July and was normal. I will check a testosterone level given his fatigue. If all this is normal, I would recommend switching the patient from Effexor to Trintellix 10 mg a day. I will await the results of his labs and x-ray.  05/04/16 Labs did reveal low testosterone. At that time I started the patient on Wellbutrin in addition to Effexor for depression mainly due to cost. He discontinued the medication due to nausea after less than a month he is here today complaining of worsening depression and anhedonia and sadness. He is also reporting palpitations. He has a history of SVT status post ablation and paroxysmal atrial fibrillation.  On his exam today he has normal sinus rhythm on exam although he does have occasional irregular heartbeats. This was captured on 3 separate occasions on EKG. He had 2 PVCs in close proximity. Otherwise he shows his right bundle branch block with inferior Q waves in lead 3 and aVF that are chronic. He admits that he is under more stress and not sleeping well and consuming more caffeine. He is also been taking BC powders due to arthralgias in his hands particularly the MCP joints and PIP joints.  At  that time, my plan was: Palpitations seem to be PVCs. Increase carvedilol to 25 mg twice a day and recheck in one month. Discontinue Effexor due to ineffectiveness. Replace with Trintellix 5 mg a day and increased to 10 mg a day in one week and then recheck in one month. Used Voltaren gel 2 g 4 times a day for arthralgias in his hand joints. Avoid NSAIDs and caffeine due to his history of ASCVD as well as his palpitations  05/13/16 Things spiraled out of control.  Trintellix made him feel foggy headed. Therefore he stopped it and the Effexor simultaneously. He had been on some type of antidepressant for more than 5 years. He is now taking NONE. Since making this change, he reports disequilibrium, vertigo, feeling lightheaded, dizzy and weak. Furthermore his palpitations have worsened. He is now having nearly constant PVCs. However on exam I do not auscultate any PVCs and he is in normal sinus rhythm. However he also feels dizzy and weak and his blood pressure today is low at 90/60.  Past Medical History:  Diagnosis Date  . Allergic rhinitis    chronic sinusitis  . Arthritis   . CAD (coronary artery disease)    a. 2009 PCI/DES to mLAD; b. 05/2010 s/p PCI RCA and LAD.; c. 05/2013 Cath: LAD mild ISR, LCX nl, RCA 40p, patent stent.  . Cataract    has had lasik surg  . COPD (chronic obstructive pulmonary disease) ([place])   . Diverticulosis   . DJD (degenerative joint disease)   . Esophagitis 1991   grade 1  . GERD (gastroesophageal reflux disease)    Hiatal hernia  . Hemorrhoids   . Hyperlipidemia   . Hypertensive heart disease   . Iron deficiency anemia   . Migraines   . Paroxysmal atrial fibrillation (HCC)    a. s/p afib ablation 10/22/08 (J. Allred).  . PVC's (premature ventricular contractions)   . Sleep apnea    [place]ional[place]: RDI during the total sleep time 6h 28 mins was 3.55/hr during REM sleep was at 5.71/hr. Supine AHI was 5.93/hr.   Past Surgical History:  Procedure Laterality Date   . CARDIAC CATHETERIZATION  05/2010   The proximal LAD was then predilated with 2.0 x 12 trek. this was then stented with a 2.5 x 16 promus Element drug-eluting stent at 14 atmosphere and prostdilated with 2.75 x 12 [place] trek at 16 atmospheres(2.8 mm) resulting the reduction of 80% proximal LAD stenosis to 0% residual with excellent flow.  Marland Kitchen CARDIAC CATHETERIZATION  06/05/2010   Successful percutaneous coronary intervention to the right coronary artery with percutaneous transluminal coronary angioplasty/stenting and insertion 3.0 x 16 mm Promus DES post dilated to 3.35 mm with stenosis being reduced to 0%  . CARDIAC CATHETERIZATION  02/2008   Post dilatation was performed using a 2.75 x 9 [place] sprinter, 10 atmospheres for 40 seconds and then 9 atmosphere for 35 sec. This resulted in the 80% area of narrowing  pre-intervention, now appearing to normal. There was no edvidence of the dissection or thrombus and there was TIMI III flow pre and post.  . CARDIAC CATHETERIZATION  02/2006  . CORONARY ANGIOPLASTY WITH STENT PLACEMENT     3 stents/ 4 caths  . EYE SURGERY    . KNEE ARTHROSCOPY     right  . LASIK    . LEFT HEART CATHETERIZATION WITH CORONARY ANGIOGRAM N/A 06/29/2013   Procedure: LEFT HEART CATHETERIZATION WITH CORONARY ANGIOGRAM;  Surgeon: Leonie Man, MD;  Location: [facility] CATH LAB;  Service: Cardiovascular;  Laterality: N/A;  . NASAL SINUS SURGERY  2001  . NECK SURGERY     Cervical fusion  . RADIOFREQUENCY ABLATION  May 2010   atrial fibrillation  . ROTATOR CUFF REPAIR     left  . SHOULDER OPEN ROTATOR CUFF REPAIR     right  . SPINE SURGERY    . WRIST ARTHROCENTESIS     left   Current Outpatient Prescriptions on File Prior to Visit  Medication Sig Dispense Refill  . amLODipine (NORVASC) 2.5 MG tablet TAKE 1 TABLET BY MOUTH  DAILY 90 tablet 3  . aspirin 81 MG tablet Take 81 mg by mouth daily.    . carvedilol (COREG) 12.5 MG tablet 2 tab po qam; 1 tab po qpm 270 tablet 3  .  cholecalciferol (VITAMIN D) 1000 UNITS tablet Take 1,000 Units by mouth daily.    . clopidogrel (PLAVIX) 75 MG tablet TAKE 1 TABLET BY MOUTH  EVERY DAY 90 tablet 3  . Coenzyme Q10 (CO Q 10) 100 MG CAPS Take 1 capsule by mouth daily.    . diclofenac sodium (VOLTAREN) 1 % GEL Apply 2 g topically 4 (four) times daily. 100 g 1  . furosemide (LASIX) 40 MG tablet TAKE 1 TABLET BY MOUTH  DAILY 90 tablet 3  . HYDROcodone-homatropine (HYCODAN) 5-1.5 MG/5ML syrup Take 5 mLs by mouth every 8 (eight) hours as needed for cough. 120 mL 0  . nitroGLYCERIN (NITROSTAT) 0.4 MG SL tablet Place 1 tablet (0.4 mg total) under the tongue every 5 (five) minutes as needed for chest pain. Need ov. 7 tablet 0  . pantoprazole (PROTONIX) 40 MG tablet TAKE 1 TABLET BY MOUTH  EVERY DAY 90 tablet 3  . potassium chloride (MICRO-K) 10 MEQ CR capsule Take 1 capsule by mouth  every day 90 capsule 2  . telmisartan (MICARDIS) 40 MG tablet TAKE 1 TABLET BY MOUTH  DAILY 90 tablet 3  . UNABLE TO FIND CPAP therapy    . zolpidem (AMBIEN) 10 MG tablet Take 1 tablet (10 mg total) by mouth at bedtime as needed (for sleep initiation). 30 tablet 2  . carvedilol (COREG) 12.5 MG tablet Take 2 tablets (25 mg total) by mouth 2 (two) times daily with a meal. (Patient not taking: Reported on 05/13/2016) 360 tablet 3  . rosuvastatin (CRESTOR) 40 MG tablet Take 1 tablet (40 mg total) by mouth daily. 30 tablet 3  . venlafaxine XR (EFFEXOR-XR) 75 MG 24 hr capsule TAKE 2 CAPSULES(150 MG) BY MOUTH DAILY WITH BREAKFAST (Patient not taking: Reported on 05/13/2016) 60 capsule 0   No current facility-administered medications on file prior to visit.    Allergies  Allergen Reactions  . Ibuprofen Swelling  . Other Shortness Of Breath    BETA BLOCKERS  . Topamax [Topiramate] Other (See Comments)    Pt states it made his tongue feel "scalded" and he couldn't eat   . Toprol Xl [Metoprolol  Succinate] Other (See Comments)    Personality change   Social  History   Social History  . Marital status: Married    Spouse name: N/A  . Number of children: 2  . Years of education: N/A   Occupational History  . retired    Social History Main Topics  . Smoking status: Former Smoker    Quit date: 06/01/1987  . Smokeless tobacco: Never Used  . Alcohol use 0.0 oz/week     Comment: once every 6-7 months  . Drug use: No  . Sexual activity: Not on file   Other Topics Concern  . Not on file   Social History Narrative  . No narrative on file      Review of Systems  All other systems reviewed and are negative.      Objective:   Physical Exam  Neck: No JVD present. No thyromegaly present.  Cardiovascular: Normal rate, regular rhythm and normal heart sounds.   No murmur heard. Pulmonary/Chest: Effort normal and breath sounds normal. No respiratory distress. He has no wheezes. He has no rales.  Abdominal: Soft. Bowel sounds are normal.  Musculoskeletal: He exhibits no edema.  Vitals reviewed.         Assessment & Plan:  Irregular heart rate  Moderate single current episode of major depressive disorder (HCC)  Low blood pressure reading  Given his past medical history, I do think he needs to see his cardiologist for a monitor to rule out significant cardiac arrhythmias. However I believe he is having frequent PVCs possibly triggered by all the medication changes in the last 3 months. This all started when I discontinued Celexa due to his worsening depression. I believe the majority of the symptoms he is feeling today are due to serotonin withdrawal. Therefore I recommended the patient resume his previous dose of Celexa 40 mg a day. Also think we need to reduce his blood pressure medication as he is hypertensive this too can be playing a role in his dizziness. Therefore discontinue amlodipine and reduce Micardis to 20 mg a day. Recheck the patient in one week but I suspect that the patient will feel better with slightly higher blood  pressure and also resumption of his serotonin agonist

## 2016-05-20 ENCOUNTER — Encounter: Payer: Self-pay | Admitting: Family Medicine

## 2016-05-20 ENCOUNTER — Ambulatory Visit (INDEPENDENT_AMBULATORY_CARE_PROVIDER_SITE_OTHER): Payer: 59 | Admitting: Cardiology

## 2016-05-20 ENCOUNTER — Encounter: Payer: Self-pay | Admitting: Cardiology

## 2016-05-20 ENCOUNTER — Ambulatory Visit (INDEPENDENT_AMBULATORY_CARE_PROVIDER_SITE_OTHER): Payer: 59 | Admitting: Family Medicine

## 2016-05-20 VITALS — BP 100/62 | HR 86 | Temp 98.8°F | Resp 20 | Ht 68.0 in | Wt 282.0 lb

## 2016-05-20 DIAGNOSIS — I480 Paroxysmal atrial fibrillation: Secondary | ICD-10-CM

## 2016-05-20 DIAGNOSIS — I10 Essential (primary) hypertension: Secondary | ICD-10-CM | POA: Diagnosis not present

## 2016-05-20 DIAGNOSIS — F321 Major depressive disorder, single episode, moderate: Secondary | ICD-10-CM | POA: Diagnosis not present

## 2016-05-20 DIAGNOSIS — I2510 Atherosclerotic heart disease of native coronary artery without angina pectoris: Secondary | ICD-10-CM

## 2016-05-20 DIAGNOSIS — G4733 Obstructive sleep apnea (adult) (pediatric): Secondary | ICD-10-CM

## 2016-05-20 DIAGNOSIS — R002 Palpitations: Secondary | ICD-10-CM

## 2016-05-20 DIAGNOSIS — I499 Cardiac arrhythmia, unspecified: Secondary | ICD-10-CM

## 2016-05-20 DIAGNOSIS — F3289 Other specified depressive episodes: Secondary | ICD-10-CM

## 2016-05-20 DIAGNOSIS — Z9861 Coronary angioplasty status: Secondary | ICD-10-CM

## 2016-05-20 DIAGNOSIS — F32A Depression, unspecified: Secondary | ICD-10-CM | POA: Insufficient documentation

## 2016-05-20 DIAGNOSIS — F329 Major depressive disorder, single episode, unspecified: Secondary | ICD-10-CM | POA: Insufficient documentation

## 2016-05-20 NOTE — Assessment & Plan Note (Signed)
a. s/p afib ablation 10/22/08 (J. Allred).

## 2016-05-20 NOTE — Progress Notes (Signed)
Subjective:    Patient ID: Lee [name], male    DOB: 06-29-1950, 65 y.o.   MRN: [id number]  HPI   01/08/16 Most recent cortisone injection lasted 6 months. He would like another today. He is again starting to get pain in the anterior and posterior aspects of the medial joint line. He reports sharp pain with ambulation. He reports pain under his kneecap going up and down steps. He defers an MRI of the present time as he is not willing to get surgery as long as the cortisone injections are working as well as they are. His other concern is worsening depression. He has been on Celexa 40 mg a day for about 5 years. Please see [place]. However he is now having worsening symptoms of depression including anhedonia, poor energy, poor sleeping. He reports depression. He reports poor concentration. He reports avoidance of crowds due to fear of being in crowds. He is interested in trying something different for his depression.  At that time, my plan was: Depression, discontinue citalopram. Replaced with Effexor XR 75 mg by mouth every morning and increase 250 mg by mouth every morning and one week. Recheck in 4-6 weeks  02/09/16 Patient has seen no benefit since switching from Celexa to Effexor. However today he states that he's having significant dyspnea on exertion. He is getting easily winded with very little activity. He also reports a daily cough. Patient had a stress test performed of his heart in April of this year which was completely normal with an ejection fraction of 55-65% and no ischemia. His last chest x-ray was in December 2015 and was normal. He has not had pulmonary function test. 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 He was exposed to a tremendous amount of stool or gas and also possible smoke. 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 He denies any hemoptysis. He denies any pleurisy. He also believes his possible shortness of  breath could be due to deconditioning. He also knows that he is overweight which could be also adding to his shortness of breath. In July checked all his lab work including a CBC which showed no evidence of anemia. TSH was normal. However he has not had a testosterone level checked. He also continues to complain of depression.  At that time, my plan was:  Patient did exceptionally well on his pulmonary function test. His FEV1 was 3.06 L which is 109% of predicted. His FVC was 3.85 L which is 102% of predicted. His FEV1 ratio was therefore 80% which is normal. There is no evidence of obstructive or restrictive lung disease. That being said, given his shortness of breath, I would recommend getting a baseline chest x-ray to rule out structural lesions within the lung. CBC was just checked in July and was normal. I will check a testosterone level given his fatigue. If all this is normal, I would recommend switching the patient from Effexor to Trintellix 10 mg a day. I will await the results of his labs and x-ray.  05/04/16 Labs did reveal low testosterone. At that time I started the patient on Wellbutrin in addition to Effexor for depression mainly due to cost. He discontinued the medication due to nausea after less than a month he is here today complaining of worsening depression and anhedonia and sadness. He is also reporting palpitations. He has a history of SVT status post ablation and paroxysmal atrial fibrillation.  On his exam today he has normal sinus rhythm on exam although he does have occasional irregular heartbeats. This was captured on 3 separate occasions on EKG. He had 2 PVCs in close proximity. Otherwise he shows his right bundle branch block with inferior Q waves in lead 3 and aVF that are chronic. He admits that he is under more stress and not sleeping well and consuming more caffeine. He is also been taking BC powders due to arthralgias in his hands particularly the MCP joints and PIP joints.  At  that time, my plan was: Palpitations seem to be PVCs. Increase carvedilol to 25 mg twice a day and recheck in one month. Discontinue Effexor due to ineffectiveness. Replace with Trintellix 5 mg a day and increased to 10 mg a day in one week and then recheck in one month. Used Voltaren gel 2 g 4 times a day for arthralgias in his hand joints. Avoid NSAIDs and caffeine due to his history of ASCVD as well as his palpitations  05/13/16 Things spiraled out of control.  Trintellix made him feel foggy headed. Therefore he stopped it and the Effexor simultaneously. He had been on some type of antidepressant for more than 5 years. He is now taking NONE. Since making this change, he reports disequilibrium, vertigo, feeling lightheaded, dizzy and weak. Furthermore his palpitations have worsened. He is now having nearly constant PVCs. However on exam I do not auscultate any PVCs and he is in normal sinus rhythm. However he also feels dizzy and weak and his blood pressure today is low at 90/60.  At that tie, my plan was: Given his past medical history, I do think he needs to see his cardiologist for a monitor to rule out significant cardiac arrhythmias. However I believe he is having frequent PVCs possibly triggered by all the medication changes in the last 3 months. This all started when I discontinued Celexa due to his worsening depression. I believe the majority of the symptoms he is feeling today are due to serotonin withdrawal. Therefore I recommended the patient resume his previous dose of Celexa 40 mg a day. Also think we need to reduce his blood pressure medication as he is hypotensive this too can be playing a role in his dizziness. Therefore discontinue amlodipine and reduce Micardis to 20 mg a day. Recheck the patient in one week but I suspect that the patient will feel better with slightly higher blood pressure and also resumption of his serotonin agonist.  05/20/16  Patient feels much better since resuming  the Celexa.  His dizziness and disequilibrium has completely stopped. Over the last 48-72 hours the PVCs have also subsided. He is scheduled to see his cardiologist later this afternoon. He has had one episode that sounds like a panic attack earlier in the week where he became suddenly short of breath. He has had an extensive workup including normal pulmonary function test, a normal chest x-ray, a myocardial perfusion test in April with a normal ejection fraction. Therefore I believe that the event that he described represented a panic attack. Past Medical History:  Diagnosis Date  . Allergic rhinitis    chronic sinusitis  . Arthritis   . CAD (coronary artery disease)    a. 2009 PCI/DES to mLAD; b. 05/2010 s/p PCI RCA and LAD.; c. 05/2013 Cath: LAD mild ISR, LCX nl, RCA 40p, patent stent.  . Cataract    has had lasik surg  . COPD (chronic obstructive pulmonary disease) ([place])   .  Diverticulosis   . DJD (degenerative joint disease)   . Esophagitis 1991   grade 1  . GERD (gastroesophageal reflux disease)    Hiatal hernia  . Hemorrhoids   . Hyperlipidemia   . Hypertensive heart disease   . Iron deficiency anemia   . Migraines   . Paroxysmal atrial fibrillation ([facility])    a. s/p afib ablation 10/22/08 (J. Allred).  . PVC's (premature ventricular contractions)   . Sleep apnea    [place]: RDI during the total sleep time 6h 28 mins was 3.55/hr during REM sleep was at 5.71/hr. Supine AHI was 5.93/hr.   Past Surgical History:  Procedure Laterality Date  . CARDIAC CATHETERIZATION  05/2010   The proximal LAD was then predilated with 2.0 x 12 trek. this was then stented with a 2.5 x 16 promus Element drug-eluting stent at 14 atmosphere and prostdilated with 2.75 x 12 [place] trek at 16 atmospheres(2.8 mm) resulting the reduction of 80% proximal LAD stenosis to 0% residual with excellent flow.  Marland Kitchen CARDIAC CATHETERIZATION  06/05/2010   Successful percutaneous coronary intervention to the right coronary  artery with percutaneous transluminal coronary angioplasty/stenting and insertion 3.0 x 16 mm Promus DES post dilated to 3.35 mm with stenosis being reduced to 0%  . CARDIAC CATHETERIZATION  02/2008   Post dilatation was performed using a 2.75 x 9 [place] sprinter, 10 atmospheres for 40 seconds and then 9 atmosphere for 35 sec. This resulted in the 80% area of narrowing pre-intervention, now appearing to normal. There was no edvidence of the dissection or thrombus and there was TIMI III flow pre and post.  . CARDIAC CATHETERIZATION  02/2006  . CORONARY ANGIOPLASTY WITH STENT PLACEMENT     3 stents/ 4 caths  . EYE SURGERY    . KNEE ARTHROSCOPY     right  . LASIK    . LEFT HEART CATHETERIZATION WITH CORONARY ANGIOGRAM N/A 06/29/2013   Procedure: LEFT HEART CATHETERIZATION WITH CORONARY ANGIOGRAM;  Surgeon: Leonie Man, MD;  Location: [facility] CATH LAB;  Service: Cardiovascular;  Laterality: N/A;  . NASAL SINUS SURGERY  2001  . NECK SURGERY     Cervical fusion  . RADIOFREQUENCY ABLATION  May 2010   atrial fibrillation  . ROTATOR CUFF REPAIR     left  . SHOULDER OPEN ROTATOR CUFF REPAIR     right  . SPINE SURGERY    . WRIST ARTHROCENTESIS     left   Current Outpatient Prescriptions on File Prior to Visit  Medication Sig Dispense Refill  . aspirin 81 MG tablet Take 81 mg by mouth daily.    . carvedilol (COREG) 12.5 MG tablet Take 2 tablets (25 mg total) by mouth 2 (two) times daily with a meal. 360 tablet 3  . cholecalciferol (VITAMIN D) 1000 UNITS tablet Take 1,000 Units by mouth daily.    . citalopram (CELEXA) 40 MG tablet Take 1 tablet (40 mg total) by mouth daily. 30 tablet 3  . clopidogrel (PLAVIX) 75 MG tablet TAKE 1 TABLET BY MOUTH  EVERY DAY 90 tablet 3  . Coenzyme Q10 (CO Q 10) 100 MG CAPS Take 1 capsule by mouth daily.    . diclofenac sodium (VOLTAREN) 1 % GEL Apply 2 g topically 4 (four) times daily. 100 g 1  . fluticasone (FLONASE) 50 MCG/ACT nasal spray INSTILL 2 SPRAYS IN THE NOSE  AT BEDTIME 48 g 3  . furosemide (LASIX) 40 MG tablet TAKE 1 TABLET BY MOUTH  DAILY 90 tablet  3  . nitroGLYCERIN (NITROSTAT) 0.4 MG SL tablet Place 1 tablet (0.4 mg total) under the tongue every 5 (five) minutes as needed for chest pain. Need ov. 7 tablet 0  . oxyCODONE-acetaminophen (PERCOCET) 10-325 MG tablet Take 1 tablet by mouth every 6 (six) hours as needed for pain.     . pantoprazole (PROTONIX) 40 MG tablet TAKE 1 TABLET BY MOUTH  EVERY DAY 90 tablet 3  . potassium chloride (MICRO-K) 10 MEQ CR capsule Take 1 capsule by mouth  every day 90 capsule 2  . telmisartan (MICARDIS) 40 MG tablet TAKE 1 TABLET BY MOUTH  DAILY 90 tablet 3  . UNABLE TO FIND CPAP therapy    . amLODipine (NORVASC) 2.5 MG tablet TAKE 1 TABLET BY MOUTH  DAILY (Patient not taking: Reported on 05/20/2016) 90 tablet 3  . rosuvastatin (CRESTOR) 40 MG tablet Take 1 tablet (40 mg total) by mouth daily. 30 tablet 3  . zolpidem (AMBIEN) 10 MG tablet Take 1 tablet (10 mg total) by mouth at bedtime as needed (for sleep initiation). 30 tablet 2   No current facility-administered medications on file prior to visit.    Allergies  Allergen Reactions  . Ibuprofen Swelling  . Other Shortness Of Breath    BETA BLOCKERS  . Topamax [Topiramate] Other (See Comments)    Pt states it made his tongue feel "scalded" and he couldn't eat   . Toprol Xl [Metoprolol Succinate] Other (See Comments)    Personality change   Social History   Social History  . Marital status: Married    Spouse name: N/A  . Number of children: 2  . Years of education: N/A   Occupational History  . retired    Social History Main Topics  . Smoking status: Former Smoker    Quit date: 06/01/1987  . Smokeless tobacco: Never Used  . Alcohol use 0.0 oz/week     Comment: once every 6-7 months  . Drug use: No  . Sexual activity: Not on file   Other Topics Concern  . Not on file   Social History Narrative  . No narrative on file      Review of Systems    All other systems reviewed and are negative.      Objective:   Physical Exam  Neck: No JVD present. No thyromegaly present.  Cardiovascular: Normal rate, regular rhythm and normal heart sounds.   No murmur heard. Pulmonary/Chest: Effort normal and breath sounds normal. No respiratory distress. He has no wheezes. He has no rales.  Abdominal: Soft. Bowel sounds are normal.  Musculoskeletal: He exhibits no edema.  Vitals reviewed.         Assessment & Plan:  Irregular heart rate  Moderate single current episode of major depressive disorder ([place])  I will feel much better after he sees the cardiologist and likely has an event monitor just to ensure that all he is experiencing are PVCs given his significant past history of SVT and atrial fibrillation. However I believe them a hard that the patient's having PVCs related to his anxiety and the medication changes. As result the symptoms have improved since we resumed his previous medication. Therefore I recommended no further changes at this time. I would like the patient to stabilize on the Celexa for at least 6 weeks prior to making any other changes. Await the results of his cardiology evaluation this afternoon

## 2016-05-20 NOTE — Assessment & Plan Note (Signed)
Compliant with C-pap 

## 2016-05-20 NOTE — Assessment & Plan Note (Signed)
BMI 43 

## 2016-05-20 NOTE — Patient Instructions (Signed)
Medication Instructions:  Your physician recommends that you continue on your current medications as directed. Please refer to the Current Medication list given to you today.  Labwork: NONE  Testing/Procedures: NONE  Follow-Up: FOLLOW UP WITH LUKE KILROY-PA IN 2 WEEKS.   If you need a refill on your cardiac medications before your next appointment, please call your pharmacy.

## 2016-05-20 NOTE — Assessment & Plan Note (Signed)
Seen as an add on for recent palpitations

## 2016-05-20 NOTE — Progress Notes (Signed)
05/20/2016 Lee [name]   [birth year]  [id number]  Primary Physician [name], MD Primary Cardiologist: [name]  HPI:  65 y/o morbidly obese caucasian male with a history of OSA, on C pap, CAD, s/p PCI 2015, depression, HTN, and HLD. LOV with [name] was Jul;y 2017. The pt has had problems with depression and his PCP had recently been adjusting his medications. During this time he had noted some increased palpations. He describes extra hard beats with associated SOB, no sustained tachycardia. This all got better a few days ago when he was changed to Celexa. Dr Dennard Schaumann had also stopped his Norvasc and decreased his Micardis secondary to low B/P.  He has no follow up scheduled.    Current Outpatient Prescriptions  Medication Sig Dispense Refill  . aspirin 81 MG tablet Take 81 mg by mouth daily.    . carvedilol (COREG) 12.5 MG tablet Take 2 tablets (25 mg total) by mouth 2 (two) times daily with a meal. 360 tablet 3  . cholecalciferol (VITAMIN D) 1000 UNITS tablet Take 1,000 Units by mouth daily.    . citalopram (CELEXA) 40 MG tablet Take 1 tablet (40 mg total) by mouth daily. 30 tablet 3  . clopidogrel (PLAVIX) 75 MG tablet TAKE 1 TABLET BY MOUTH  EVERY DAY 90 tablet 3  . Coenzyme Q10 (CO Q 10) 100 MG CAPS Take 1 capsule by mouth daily.    . diclofenac sodium (VOLTAREN) 1 % GEL Apply 2 g topically 4 (four) times daily. 100 g 1  . fluticasone (FLONASE) 50 MCG/ACT nasal spray INSTILL 2 SPRAYS IN THE NOSE AT BEDTIME 48 g 3  . furosemide (LASIX) 40 MG tablet TAKE 1 TABLET BY MOUTH  DAILY 90 tablet 3  . nitroGLYCERIN (NITROSTAT) 0.4 MG SL tablet Place 1 tablet (0.4 mg total) under the tongue every 5 (five) minutes as needed for chest pain. Need ov. 7 tablet 0  . oxyCODONE-acetaminophen (PERCOCET) 10-325 MG tablet Take 1 tablet by mouth every 6 (six) hours as needed for pain.     . pantoprazole (PROTONIX) 40 MG tablet TAKE 1 TABLET BY MOUTH  EVERY DAY 90 tablet 3  . potassium  chloride (MICRO-K) 10 MEQ CR capsule Take 1 capsule by mouth  every day 90 capsule 2  . rosuvastatin (CRESTOR) 40 MG tablet Take 40 mg by mouth daily.    Marland Kitchen telmisartan (MICARDIS) 40 MG tablet Take by mouth daily. PT TAKES 20 MG    . UNABLE TO FIND CPAP therapy    . zolpidem (AMBIEN) 10 MG tablet Take 10 mg by mouth at bedtime as needed for sleep.     No current facility-administered medications for this visit.     Allergies  Allergen Reactions  . Ibuprofen Swelling  . Other Shortness Of Breath    BETA BLOCKERS  . Topamax [Topiramate] Other (See Comments)    Pt states it made his tongue feel "scalded" and he couldn't eat   . Toprol Xl [Metoprolol Succinate] Other (See Comments)    Personality change    Social History   Social History  . Marital status: Married    Spouse name: N/A  . Number of children: 2  . Years of education: N/A   Occupational History  . retired    Social History Main Topics  . Smoking status: Former Smoker    Quit date: 06/01/1987  . Smokeless tobacco: Never Used  . Alcohol use 0.0 oz/week     Comment: once  every 6-7 months  . Drug use: No  . Sexual activity: Not on file   Other Topics Concern  . Not on file   Social History Narrative  . No narrative on file     Review of Systems: General: negative for chills, fever, night sweats or weight changes.  Cardiovascular: negative for chest pain, dyspnea on exertion, edema, orthopnea, palpitations, paroxysmal nocturnal dyspnea or shortness of breath Dermatological: negative for rash Respiratory: negative for cough or wheezing Urologic: negative for hematuria Abdominal: negative for nausea, vomiting, diarrhea, bright red blood per rectum, melena, or hematemesis Neurologic: negative for visual changes, syncope, or dizziness All other systems reviewed and are otherwise negative except as noted above.    Blood pressure 132/86, pulse 77, height 5\' 8"  (1.727 m), weight 283 lb 3.2 oz (128.5 kg).  General  appearance: alert, cooperative, no distress and morbidly obese Lungs: clear to auscultation bilaterally Heart: regular rate and rhythm Neurologic: Grossly normal Depressed affect  EKG NSR RBBB  ASSESSMENT AND PLAN:   PALPITATIONS Seen as an add on for recent palpitations  Essential hypertension Medications recently cut back by Dr Dennard Schaumann secondary to low B/P  Paroxysmal atrial fibrillation ([place]) a. s/p afib ablation 10/22/08 (J. Allred).  Morbid obesity BMI 43  OSA (obstructive sleep apnea) Compliant with C pap  Depression Followed by Dr Dennard Schaumann, recent medication adjustments.    PLAN  Same Rx, I'll follow up his B/P after the Holidays, I would increase Micardis if needed as opposed to adding back Amlodipine.   [name] PA-C 05/20/2016 4:09 PM

## 2016-05-20 NOTE — Assessment & Plan Note (Signed)
Followed by Dr Dennard Schaumann, recent medication adjustments.

## 2016-05-20 NOTE — Assessment & Plan Note (Signed)
Medications recently cut back by Dr Dennard Schaumann secondary to low B/P

## 2016-06-03 ENCOUNTER — Ambulatory Visit (INDEPENDENT_AMBULATORY_CARE_PROVIDER_SITE_OTHER): Payer: 59 | Admitting: Cardiology

## 2016-06-03 ENCOUNTER — Encounter: Payer: Self-pay | Admitting: Cardiology

## 2016-06-03 DIAGNOSIS — F32A Depression, unspecified: Secondary | ICD-10-CM

## 2016-06-03 DIAGNOSIS — I480 Paroxysmal atrial fibrillation: Secondary | ICD-10-CM | POA: Diagnosis not present

## 2016-06-03 DIAGNOSIS — Z9861 Coronary angioplasty status: Secondary | ICD-10-CM

## 2016-06-03 DIAGNOSIS — F329 Major depressive disorder, single episode, unspecified: Secondary | ICD-10-CM

## 2016-06-03 DIAGNOSIS — I4510 Unspecified right bundle-branch block: Secondary | ICD-10-CM

## 2016-06-03 DIAGNOSIS — I10 Essential (primary) hypertension: Secondary | ICD-10-CM

## 2016-06-03 DIAGNOSIS — I2510 Atherosclerotic heart disease of native coronary artery without angina pectoris: Secondary | ICD-10-CM | POA: Diagnosis not present

## 2016-06-03 NOTE — Progress Notes (Signed)
06/03/2016 Lee [name]   29-Sep-1950  [id number]  Primary Physician [name], MD Primary Cardiologist: [name]  HPI:   66 y/o morbidly obese caucasian male with a history of OSA, on C pap, CAD, s/p PCI 2015, depression, HTN, and HLD. LOV with [name] was Jul;y 2017. The pt has had problems with depression and his PCP had recently been adjusting his medications. During this time he had noted some increased palpations. He describes extra hard beats with associated SOB, no sustained tachycardia. This all got better a few days ago when he was changed to Celexa. Dr Dennard Schaumann had also stopped his Norvasc and decreased his Micardis secondary to low B/P. The pt is seen today to f/u his B/P and he appears to be doing well, no chest or increased SOB.   Current Outpatient Prescriptions  Medication Sig Dispense Refill  . aspirin 81 MG tablet Take 81 mg by mouth daily.    . carvedilol (COREG) 12.5 MG tablet Take 2 tablets (25 mg total) by mouth 2 (two) times daily with a meal. 360 tablet 3  . cholecalciferol (VITAMIN D) 1000 UNITS tablet Take 1,000 Units by mouth daily.    . citalopram (CELEXA) 40 MG tablet Take 1 tablet (40 mg total) by mouth daily. 30 tablet 3  . clopidogrel (PLAVIX) 75 MG tablet TAKE 1 TABLET BY MOUTH  EVERY DAY 90 tablet 3  . Coenzyme Q10 (CO Q 10) 100 MG CAPS Take 1 capsule by mouth daily.    . diclofenac sodium (VOLTAREN) 1 % GEL Apply 2 g topically 4 (four) times daily. 100 g 1  . fluticasone (FLONASE) 50 MCG/ACT nasal spray INSTILL 2 SPRAYS IN THE NOSE AT BEDTIME 48 g 3  . furosemide (LASIX) 40 MG tablet TAKE 1 TABLET BY MOUTH  DAILY 90 tablet 3  . nitroGLYCERIN (NITROSTAT) 0.4 MG SL tablet Place 1 tablet (0.4 mg total) under the tongue every 5 (five) minutes as needed for chest pain. Need ov. 7 tablet 0  . oxyCODONE-acetaminophen (PERCOCET) 10-325 MG tablet Take 1 tablet by mouth every 6 (six) hours as needed for pain.     . pantoprazole (PROTONIX) 40 MG tablet  TAKE 1 TABLET BY MOUTH  EVERY DAY 90 tablet 3  . potassium chloride (MICRO-K) 10 MEQ CR capsule Take 1 capsule by mouth  every day 90 capsule 2  . rosuvastatin (CRESTOR) 40 MG tablet Take 40 mg by mouth daily.    Marland Kitchen telmisartan (MICARDIS) 40 MG tablet Take by mouth daily. PT TAKES 20 MG    . UNABLE TO FIND CPAP therapy    . zolpidem (AMBIEN) 10 MG tablet Take 10 mg by mouth at bedtime as needed for sleep.     No current facility-administered medications for this visit.     Allergies  Allergen Reactions  . Ibuprofen Swelling  . Other Shortness Of Breath    BETA BLOCKERS  . Topamax [Topiramate] Other (See Comments)    Pt states it made his tongue feel "scalded" and he couldn't eat   . Toprol Xl [Metoprolol Succinate] Other (See Comments)    Personality change    Social History   Social History  . Marital status: Married    Spouse name: N/A  . Number of children: 2  . Years of education: N/A   Occupational History  . retired    Social History Main Topics  . Smoking status: Former Smoker    Quit date: 06/01/1987  . Smokeless tobacco:  Never Used  . Alcohol use 0.0 oz/week     Comment: once every 6-7 months  . Drug use: No  . Sexual activity: Not on file   Other Topics Concern  . Not on file   Social History Narrative  . No narrative on file     Review of Systems: General: negative for chills, fever, night sweats or weight changes.  Cardiovascular: negative for chest pain, dyspnea on exertion, edema, orthopnea, palpitations, paroxysmal nocturnal dyspnea or shortness of breath Dermatological: negative for rash Respiratory: negative for cough or wheezing Urologic: negative for hematuria Abdominal: negative for nausea, vomiting, diarrhea, bright red blood per rectum, melena, or hematemesis Neurologic: negative for visual changes, syncope, or dizziness All other systems reviewed and are otherwise negative except as noted above.    Blood pressure 127/86, pulse 68, height  5\' 8"  (1.727 m), weight 283 lb 9.6 oz (128.6 kg).  General appearance: alert, cooperative, no distress and morbidly obese Neck: no JVD Lungs: clear to auscultation bilaterally Heart: regular rate and rhythm Neurologic: Grossly normal   ASSESSMENT AND PLAN:   Essential hypertension F/U today after recent medication changes  CAD S/P percutaneous coronary angioplasty PCI 2015, Myoview low risk, normal LVF April 2017  Paroxysmal atrial fibrillation ([place]) a. s/p afib ablation 10/22/08 (J. Allred).  RBBB Chronic  Morbid obesity BMI 43  Depression Recent change to Celexa with good results   PLAN  Same Rx. F/U [name] in July  [place] Zarazua [place] PA-C 06/03/2016 3:33 PM

## 2016-06-03 NOTE — Assessment & Plan Note (Signed)
PCI 2015, Myoview low risk, normal LVF April 2017

## 2016-06-03 NOTE — Patient Instructions (Signed)
No changes with current medications    Your physician wants you to follow-up in July 2018 with DR [facility]. You will receive a reminder letter in the mail two months in advance. If you don't receive a letter, please call our office to schedule the follow-up appointment.   If you need a refill on your cardiac medications before your next appointment, please call your pharmacy.

## 2016-06-03 NOTE — Assessment & Plan Note (Signed)
F/U today after recent medication changes

## 2016-06-03 NOTE — Assessment & Plan Note (Signed)
Chronic. 

## 2016-06-03 NOTE — Assessment & Plan Note (Signed)
BMI 43 

## 2016-06-03 NOTE — Assessment & Plan Note (Signed)
a. s/p afib ablation 10/22/08 (J. Allred).

## 2016-06-03 NOTE — Assessment & Plan Note (Signed)
Recent change to Celexa with good results

## 2016-06-04 ENCOUNTER — Encounter: Payer: Self-pay | Admitting: Family Medicine

## 2016-06-04 ENCOUNTER — Ambulatory Visit (INDEPENDENT_AMBULATORY_CARE_PROVIDER_SITE_OTHER): Payer: 59 | Admitting: Family Medicine

## 2016-06-04 VITALS — BP 118/70 | HR 76 | Temp 98.7°F | Resp 18 | Ht 68.0 in | Wt 284.0 lb

## 2016-06-04 DIAGNOSIS — R7303 Prediabetes: Secondary | ICD-10-CM | POA: Diagnosis not present

## 2016-06-04 LAB — CBC WITH DIFFERENTIAL/PLATELET
BASOS PCT: 0 %
Basophils Absolute: 0 cells/uL (ref 0–200)
EOS ABS: 156 {cells}/uL (ref 15–500)
Eosinophils Relative: 2 %
HEMATOCRIT: 42.3 % (ref 38.5–50.0)
Hemoglobin: 13.7 g/dL (ref 13.0–17.0)
LYMPHS PCT: 21 %
Lymphs Abs: 1638 cells/uL (ref 850–3900)
MCH: 29.1 pg (ref 27.0–33.0)
MCHC: 32.4 g/dL (ref 32.0–36.0)
MCV: 89.8 fL (ref 80.0–100.0)
MONO ABS: 546 {cells}/uL (ref 200–950)
MONOS PCT: 7 %
MPV: 9 fL (ref 7.5–12.5)
Neutro Abs: 5460 cells/uL (ref 1500–7800)
Neutrophils Relative %: 70 %
PLATELETS: 201 10*3/uL (ref 140–400)
RBC: 4.71 MIL/uL (ref 4.20–5.80)
RDW: 12.9 % (ref 11.0–15.0)
WBC: 7.8 10*3/uL (ref 3.8–10.8)

## 2016-06-04 LAB — HEMOGLOBIN A1C
Hgb A1c MFr Bld: 7 % — ABNORMAL HIGH (ref <5.7)
Mean Plasma Glucose: 154 mg/dL

## 2016-06-04 NOTE — Addendum Note (Signed)
Addended by: Shary Decamp B on: 06/04/2016 08:59 AM   Modules accepted: Orders

## 2016-06-04 NOTE — Progress Notes (Signed)
Subjective:    Patient ID: Lee [name], male    DOB: 12-Jan-1951, 66 y.o.   MRN: [id number]  HPI   01/08/16 Most recent cortisone injection lasted 6 months. He would like another today. He is again starting to get pain in the anterior and posterior aspects of the medial joint line. He reports sharp pain with ambulation. He reports pain under his kneecap going up and down steps. He defers an MRI of the present time as he is not willing to get surgery as long as the cortisone injections are working as well as they are. His other concern is worsening depression. He has been on Celexa 40 mg a day for about 5 years. Please see [place]. However he is now having worsening symptoms of depression including anhedonia, poor energy, poor sleeping. He reports depression. He reports poor concentration. He reports avoidance of crowds due to fear of being in crowds. He is interested in trying something different for his depression.  At that time, my plan was: Depression, discontinue citalopram. Replaced with Effexor XR 75 mg by mouth every morning and increase 250 mg by mouth every morning and one week. Recheck in 4-6 weeks  02/09/16 Patient has seen no benefit since switching from Celexa to Effexor. However today he states that he's having significant dyspnea on exertion. He is getting easily winded with very little activity. He also reports a daily cough. Patient had a stress test performed of his heart in April of this year which was completely normal with an ejection fraction of 55-65% and no ischemia. His last chest x-ray was in December 2015 and was normal. He has not had pulmonary function test. 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 He was exposed to a tremendous amount of stool or gas and also possible smoke. 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 He denies any hemoptysis. He denies any pleurisy. He also believes his possible shortness of  breath could be due to deconditioning. He also knows that he is overweight which could be also adding to his shortness of breath. In July checked all his lab work including a CBC which showed no evidence of anemia. TSH was normal. However he has not had a testosterone level checked. He also continues to complain of depression.  At that time, my plan was:  Patient did exceptionally well on his pulmonary function test. His FEV1 was 3.06 L which is 109% of predicted. His FVC was 3.85 L which is 102% of predicted. His FEV1 ratio was therefore 80% which is normal. There is no evidence of obstructive or restrictive lung disease. That being said, given his shortness of breath, I would recommend getting a baseline chest x-ray to rule out structural lesions within the lung. CBC was just checked in July and was normal. I will check a testosterone level given his fatigue. If all this is normal, I would recommend switching the patient from Effexor to Trintellix 10 mg a day. I will await the results of his labs and x-ray.  05/04/16 Labs did reveal low testosterone. At that time I started the patient on Wellbutrin in addition to Effexor for depression mainly due to cost. He discontinued the medication due to nausea after less than a month he is here today complaining of worsening depression and anhedonia and sadness. He is also reporting palpitations. He has a history of SVT status post ablation and paroxysmal atrial fibrillation.  On his exam today he has normal sinus rhythm on exam although he does have occasional irregular heartbeats. This was captured on 3 separate occasions on EKG. He had 2 PVCs in close proximity. Otherwise he shows his right bundle branch block with inferior Q waves in lead 3 and aVF that are chronic. He admits that he is under more stress and not sleeping well and consuming more caffeine. He is also been taking BC powders due to arthralgias in his hands particularly the MCP joints and PIP joints.  At  that time, my plan was: Palpitations seem to be PVCs. Increase carvedilol to 25 mg twice a day and recheck in one month. Discontinue Effexor due to ineffectiveness. Replace with Trintellix 5 mg a day and increased to 10 mg a day in one week and then recheck in one month. Used Voltaren gel 2 g 4 times a day for arthralgias in his hand joints. Avoid NSAIDs and caffeine due to his history of ASCVD as well as his palpitations  05/13/16 Things spiraled out of control.  Trintellix made him feel foggy headed. Therefore he stopped it and the Effexor simultaneously. He had been on some type of antidepressant for more than 5 years. He is now taking NONE. Since making this change, he reports disequilibrium, vertigo, feeling lightheaded, dizzy and weak. Furthermore his palpitations have worsened. He is now having nearly constant PVCs. However on exam I do not auscultate any PVCs and he is in normal sinus rhythm. However he also feels dizzy and weak and his blood pressure today is low at 90/60.  At that tie, my plan was: Given his past medical history, I do think he needs to see his cardiologist for a monitor to rule out significant cardiac arrhythmias. However I believe he is having frequent PVCs possibly triggered by all the medication changes in the last 3 months. This all started when I discontinued Celexa due to his worsening depression. I believe the majority of the symptoms he is feeling today are due to serotonin withdrawal. Therefore I recommended the patient resume his previous dose of Celexa 40 mg a day. Also think we need to reduce his blood pressure medication as he is hypotensive this too can be playing a role in his dizziness. Therefore discontinue amlodipine and reduce Micardis to 20 mg a day. Recheck the patient in one week but I suspect that the patient will feel better with slightly higher blood pressure and also resumption of his serotonin agonist.  05/20/16  Patient feels much better since resuming  the Celexa.  His dizziness and disequilibrium has completely stopped. Over the last 48-72 hours the PVCs have also subsided. He is scheduled to see his cardiologist later this afternoon. He has had one episode that sounds like a panic attack earlier in the week where he became suddenly short of breath. He has had an extensive workup including normal pulmonary function test, a normal chest x-ray, a myocardial perfusion test in April with a normal ejection fraction. Therefore I believe that the event that he described represented a panic attack. At that time, my plan was: I will feel much better after he sees the cardiologist and likely has an event monitor just to ensure that all he is experiencing are PVCs given his significant past history of SVT and atrial fibrillation. However I believe them a hard that the patient's having PVCs related to his anxiety and the medication changes. As result the symptoms have improved since we resumed his previous medication.  Therefore I recommended no further changes at this time. I would like the patient to stabilize on the Celexa for at least 6 weeks prior to making any other changes. Await the results of his cardiology evaluation this afternoon  06/04/16 Patient's last blood work was in July. At that time his fasting blood sugar was 114 consistent with prediabetes. He is not monitoring his diet. Past medical history is significant for coronary artery disease. Therefore want to control his blood sugars as closely as possible. Thankfully his palpitations have completely subsided. His blood pressure stable and his heart rate is stable. He is comfortable leaving his medication regimen alone at the current time Past Medical History:  Diagnosis Date  . Allergic rhinitis    chronic sinusitis  . Arthritis   . CAD (coronary artery disease)    a. 2009 PCI/DES to mLAD; b. 05/2010 s/p PCI RCA and LAD.; c. 05/2013 Cath: LAD mild ISR, LCX nl, RCA 40p, patent stent.  . Cataract    has  had lasik surg  . COPD (chronic obstructive pulmonary disease) ([facility])   . Diverticulosis   . DJD (degenerative joint disease)   . Esophagitis 1991   grade 1  . GERD (gastroesophageal reflux disease)    Hiatal hernia  . Hemorrhoids   . Hyperlipidemia   . Hypertensive heart disease   . Iron deficiency anemia   . Migraines   . Paroxysmal atrial fibrillation ([facility])    a. s/p afib ablation 10/22/08 ([facility]).  . PVC's (premature ventricular contractions)   . Sleep apnea    Questionalble: RDI during the total sleep time 6h 28 mins was 3.55/hr during REM sleep was at 5.71/hr. Supine AHI was 5.93/hr.   Past Surgical History:  Procedure Laterality Date  . CARDIAC CATHETERIZATION  05/2010   The proximal LAD was then predilated with 2.0 x 12 trek. this was then stented with a 2.5 x 16 promus Element drug-eluting stent at 14 atmosphere and prostdilated with 2.75 x 12 [place] trek at 16 atmospheres(2.8 mm) resulting the reduction of 80% proximal LAD stenosis to 0% residual with excellent flow.  Marland Kitchen CARDIAC CATHETERIZATION  06/05/2010   Successful percutaneous coronary intervention to the right coronary artery with percutaneous transluminal coronary angioplasty/stenting and insertion 3.0 x 16 mm Promus DES post dilated to 3.35 mm with stenosis being reduced to 0%  . CARDIAC CATHETERIZATION  02/2008   Post dilatation was performed using a 2.75 x 9  sprinter, 10 atmospheres for 40 seconds and then 9 atmosphere for 35 sec. This resulted in the 80% area of narrowing pre-intervention, now appearing to normal. There was no edvidence of the dissection or thrombus and there was TIMI III flow pre and post.  . CARDIAC CATHETERIZATION  02/2006  . CORONARY ANGIOPLASTY WITH STENT PLACEMENT     3 stents/ 4 caths  . EYE SURGERY    . KNEE ARTHROSCOPY     right  . LASIK    . LEFT HEART CATHETERIZATION WITH CORONARY ANGIOGRAM N/A 06/29/2013   Procedure: LEFT HEART CATHETERIZATION WITH CORONARY ANGIOGRAM;  Surgeon: Leonie Man, MD;  Location[facility] CATH LAB;  Service: Cardiovascular;  Laterality: N/A;  . NASAL SINUS SURGERY  2001  . NECK SURGERY     Cervical fusion  . RADIOFREQUENCY ABLATION  May 2010   atrial fibrillation  . ROTATOR CUFF REPAIR     left  . SHOULDER OPEN ROTATOR CUFF REPAIR     right  . SPINE SURGERY    .  WRIST ARTHROCENTESIS     left   Current Outpatient Prescriptions on File Prior to Visit  Medication Sig Dispense Refill  . aspirin 81 MG tablet Take 81 mg by mouth daily.    . carvedilol (COREG) 12.5 MG tablet Take 2 tablets (25 mg total) by mouth 2 (two) times daily with a meal. 360 tablet 3  . cholecalciferol (VITAMIN D) 1000 UNITS tablet Take 1,000 Units by mouth daily.    . citalopram (CELEXA) 40 MG tablet Take 1 tablet (40 mg total) by mouth daily. 30 tablet 3  . clopidogrel (PLAVIX) 75 MG tablet TAKE 1 TABLET BY MOUTH  EVERY DAY 90 tablet 3  . Coenzyme Q10 (CO Q 10) 100 MG CAPS Take 1 capsule by mouth daily.    . diclofenac sodium (VOLTAREN) 1 % GEL Apply 2 g topically 4 (four) times daily. 100 g 1  . fluticasone (FLONASE) 50 MCG/ACT nasal spray INSTILL 2 SPRAYS IN THE NOSE AT BEDTIME 48 g 3  . furosemide (LASIX) 40 MG tablet TAKE 1 TABLET BY MOUTH  DAILY 90 tablet 3  . nitroGLYCERIN (NITROSTAT) 0.4 MG SL tablet Place 1 tablet (0.4 mg total) under the tongue every 5 (five) minutes as needed for chest pain. Need ov. 7 tablet 0  . oxyCODONE-acetaminophen (PERCOCET) 10-325 MG tablet Take 1 tablet by mouth every 6 (six) hours as needed for pain.     . pantoprazole (PROTONIX) 40 MG tablet TAKE 1 TABLET BY MOUTH  EVERY DAY 90 tablet 3  . potassium chloride (MICRO-K) 10 MEQ CR capsule Take 1 capsule by mouth  every day 90 capsule 2  . rosuvastatin (CRESTOR) 40 MG tablet Take 40 mg by mouth daily.    Marland Kitchen telmisartan (MICARDIS) 40 MG tablet Take by mouth daily. PT TAKES 20 MG    . UNABLE TO FIND CPAP therapy    . zolpidem (AMBIEN) 10 MG tablet Take 10 mg by mouth at bedtime as needed for  sleep.     No current facility-administered medications on file prior to visit.    Allergies  Allergen Reactions  . Ibuprofen Swelling  . Other Shortness Of Breath    BETA BLOCKERS  . Topamax [Topiramate] Other (See Comments)    Pt states it made his tongue feel "scalded" and he couldn't eat   . Toprol Xl [Metoprolol Succinate] Other (See Comments)    Personality change   Social History   Social History  . Marital status: Married    Spouse name: N/A  . Number of children: 2  . Years of education: N/A   Occupational History  . retired    Social History Main Topics  . Smoking status: Former Smoker    Quit date: 06/01/1987  . Smokeless tobacco: Never Used  . Alcohol use 0.0 oz/week     Comment: once every 6-7 months  . Drug use: No  . Sexual activity: Not on file   Other Topics Concern  . Not on file   Social History Narrative  . No narrative on file      Review of Systems  All other systems reviewed and are negative.      Objective:   Physical Exam  Neck: No JVD present. No thyromegaly present.  Cardiovascular: Normal rate, regular rhythm and normal heart sounds.   No murmur heard. Pulmonary/Chest: Effort normal and breath sounds normal. No respiratory distress. He has no wheezes. He has no rales.  Abdominal: Soft. Bowel sounds are normal.  Musculoskeletal: He exhibits  no edema.  Vitals reviewed.         Assessment & Plan:  Prediabetes - Plan: BASIC METABOLIC PANEL WITH GFR, COMPLETE METABOLIC PANEL WITH GFR, Hemoglobin A1c  Recheck hemoglobin A1c. Spent 15 minutes discussing a low carbohydrate diet, and therapeutic lifestyle changes including exercise and weight loss

## 2016-06-05 LAB — COMPLETE METABOLIC PANEL WITH GFR
ALBUMIN: 4.2 g/dL (ref 3.6–5.1)
ALK PHOS: 55 U/L (ref 40–115)
ALT: 24 U/L (ref 9–46)
AST: 20 U/L (ref 10–35)
BILIRUBIN TOTAL: 0.5 mg/dL (ref 0.2–1.2)
BUN: 9 mg/dL (ref 7–25)
CALCIUM: 8.9 mg/dL (ref 8.6–10.3)
CO2: 23 mmol/L (ref 20–31)
Chloride: 98 mmol/L (ref 98–110)
Creat: 0.64 mg/dL — ABNORMAL LOW (ref 0.70–1.25)
GFR, Est African American: >89 mL/min (ref >=60)
GLUCOSE: 199 mg/dL — AB (ref 70–99)
POTASSIUM: 4.3 mmol/L (ref 3.5–5.3)
Sodium: 133 mmol/L — ABNORMAL LOW (ref 135–146)
TOTAL PROTEIN: 6.3 g/dL (ref 6.1–8.1)

## 2016-06-07 ENCOUNTER — Encounter: Payer: Self-pay | Admitting: Family Medicine

## 2016-06-17 ENCOUNTER — Ambulatory Visit: Payer: 59 | Admitting: Podiatry

## 2016-06-24 ENCOUNTER — Encounter: Payer: Self-pay | Admitting: Podiatry

## 2016-06-24 ENCOUNTER — Ambulatory Visit (INDEPENDENT_AMBULATORY_CARE_PROVIDER_SITE_OTHER): Payer: 59 | Admitting: Podiatry

## 2016-06-24 DIAGNOSIS — B351 Tinea unguium: Secondary | ICD-10-CM

## 2016-06-24 DIAGNOSIS — Q828 Other specified congenital malformations of skin: Secondary | ICD-10-CM

## 2016-06-24 DIAGNOSIS — M79676 Pain in unspecified toe(s): Secondary | ICD-10-CM

## 2016-06-24 NOTE — Progress Notes (Signed)
He presents today with chief complaint of painful elongated toenails and corns and calluses.  Objective: Pulses are strongly palpable. Neurologic sensorium is intact. Reflexes are intact. Muscle strength was 5 over 5 dorsiflexion plantar flexors and inverters everters on the musculatures intact. Toenails are thick yellow dystrophic onychomycotic with multiple calluses plantar aspect bilateral foot.  Assessment: Pain in limb secondary to onychomycosis and porokeratosis.  Plan: Debridement of hyperkeratotic tissue bilateral with chemical debridement as well. Debrided toenails 1 through 5 bilateral.

## 2016-07-01 ENCOUNTER — Telehealth: Payer: Self-pay | Admitting: Family Medicine

## 2016-07-01 NOTE — Telephone Encounter (Signed)
Patient requesting a refill on Oxycodone - Ok to refill??        CB# [phone]

## 2016-07-02 MED ORDER — OXYCODONE-ACETAMINOPHEN 10-325 MG PO TABS: 1.0000 | ORAL_TABLET | Freq: Four times a day (QID) | ORAL | 0 refills | Status: DC | PRN

## 2016-07-02 NOTE — Telephone Encounter (Signed)
RX printed, left up front and patient aware to pick up  

## 2016-07-02 NOTE — Telephone Encounter (Signed)
ok 

## 2016-07-13 ENCOUNTER — Ambulatory Visit (INDEPENDENT_AMBULATORY_CARE_PROVIDER_SITE_OTHER): Payer: 59 | Admitting: Family Medicine

## 2016-07-13 ENCOUNTER — Encounter: Payer: Self-pay | Admitting: Family Medicine

## 2016-07-13 VITALS — BP 130/80 | HR 70 | Temp 97.7°F | Resp 18 | Ht 68.0 in | Wt 275.0 lb

## 2016-07-13 DIAGNOSIS — M25561 Pain in right knee: Secondary | ICD-10-CM | POA: Diagnosis not present

## 2016-07-13 NOTE — Progress Notes (Signed)
Subjective:    Patient ID: Lee [name], male    DOB: Jul 17, 1950, 66 y.o.   MRN: [id number]  HPI  09/03/14 Patient presents with 2 weeks of worsening knee pain in his right knee. He hurts in the posterior medial knee joint as well as the posterior lateral knee joint. On examination today there is no laxity to varus or valgus stress. He has a negative anterior and posterior drawer sign. However Apley grind test elicit significant pain in his medial meniscal area. He also has some mild tenderness to palpation in the joint lines bilaterally. The patient denies any specific injury. He does have a history of a meniscal tear in that same knee. The pain began gradually while he was walking 2 weeks ago.  At that time, my plan was:  I recommended conservative management initially. Offer the patient a cortisone injection but he declined today. He would like to try pain medication and tincture of time. If not improving over the next 2-3 weeks with time and rest ice and pain medication, he will return for cortisone injection. If no better after the cortisone injection, I would proceed with an MRI to evaluate further  09/24/14 The patient's pain is better but he continues to have pain over the medial aspect of his knee. The pain is mainly over the joint line. On examination today there is no laxity to varus or valgus stress. He has a negative anterior and posterior drawer sign. He continues to have pain in the medial aspect of the knee joint on Apley grind. He is failing conservative therapy and would like to proceed with cortisone injection today  At that time, my plan was: Using sterile technique, the right knee was injected with a mixture of 2 mL of 0.1% lidocaine, 2 mL of Marcaine, and 2 mL of 40 mg per mL Kenalog. Patient tolerated the procedure well without complication. If the patient's knee pain does not improve over the next 2 weeks, I would order an MRI of the knee to evaluate further. If it does  confirm my initial suspicion which is a meniscal tear, I will schedule the patient to see an orthopedic surgeon. He would like to see Dr. Noemi Chapel if necessary.  07/03/15 Patient did relatively well since the last time I saw him. The cortisone shot really helped the knee pain he was having in his right knee. However over the last month or so, the pain has returned. It continues to be located over the posterior medial aspect of the right knee. He has a positive Apley grind today. He has no laxity to varus or valgus stress. He has a negative anterior posterior drawer sign. He does have some enemas to palpation over the medial joint line. There is a slight effusion in the knee joint. Patient states he has pain with walking particular going up and down steps. Furthermore he states the knee feels like it may give out on him at times. He denies any locking of the knee joint.  AT that time, my plan was: I suspect a meniscal tear with underlying osteoarthritis. Using sterile technique, I injected the right knee with a mixture of 2 mL of lidocaine, 2 mL of Marcaine, and 2 mL of 40 mg per mL Kenalog. The patient tolerated the procedure well with no complication. If the pain does not improve at this point, I would recommend an MRI to confirm suspicions and likely orthopedic consultation.  01/08/16 Most recent cortisone injection lasted 6 months. He  would like another today. He is again starting to get pain in the anterior and posterior aspects of the medial joint line. He reports sharp pain with ambulation. He reports pain under his kneecap going up and down steps. He defers an MRI of the present time as he is not willing to get surgery as long as the cortisone injections are working as well as they are. His other concern is worsening depression. He has been on Celexa 40 mg a day for about 5 years. Please see [place]. However he is now having worsening symptoms of depression including anhedonia, poor energy, poor sleeping.  He reports depression. He reports poor concentration. He reports avoidance of crowds due to fear of being in crowds. He is interested in trying something different for his depression.  At that time, my plan was: Right knee pain secondary to osteoarthritis and possible medial meniscal tear. Using sterile technique, I injected the right knee with 2 mL of lidocaine, 2 mL of Marcaine, and 2 mL of 40 mg per mL Kenalog. The patient tolerated the procedure well.  Depression, discontinue citalopram. Replaced with Effexor XR 75 mg by mouth every morning and increase 250 mg by mouth every morning and one week. Recheck in 4-6 weeks  07/13/16 The patient's right medial knee pain has gradually returned.  It hurts on the medial aspect of the knee joint and posteriorly whenever he walks or stands for prolonged period of time. Is particularly made worse by twisting motions in the knee. He is requesting a repeat cortisone injection in the knee.  Past Medical History:  Diagnosis Date  . Allergic rhinitis    chronic sinusitis  . Arthritis   . CAD (coronary artery disease)    a. 2009 PCI/DES to mLAD; b. 05/2010 s/p PCI RCA and LAD.; c. 05/2013 Cath: LAD mild ISR, LCX nl, RCA 40p, patent stent.  . Cataract    has had lasik surg  . COPD (chronic obstructive pulmonary disease) ([place])   . Diabetes mellitus type 2, controlled, without complications ([place])   . Diverticulosis   . DJD (degenerative joint disease)   . Esophagitis 1991   grade 1  . GERD (gastroesophageal reflux disease)    Hiatal hernia  . Hemorrhoids   . Hyperlipidemia   . Hypertensive heart disease   . Iron deficiency anemia   . Migraines   . Paroxysmal atrial fibrillation ([facility])    a. s/p afib ablation 10/22/08 ([facility]).  . PVC's (premature ventricular contractions)   . Sleep apnea    [place]ionalble: RDI during the total sleep time 6h 28 mins was 3.55/hr during REM sleep was at 5.71/hr. Supine AHI was 5.93/hr.   Past Surgical History:    Procedure Laterality Date  . CARDIAC CATHETERIZATION  05/2010   The proximal LAD was then predilated with 2.0 x 12 trek. this was then stented with a 2.5 x 16 promus Element drug-eluting stent at 14 atmosphere and prostdilated with 2.75 x 12 [place] trek at 16 atmospheres(2.8 mm) resulting the reduction of 80% proximal LAD stenosis to 0% residual with excellent flow.  Marland Kitchen CARDIAC CATHETERIZATION  06/05/2010   Successful percutaneous coronary intervention to the right coronary artery with percutaneous transluminal coronary angioplasty/stenting and insertion 3.0 x 16 mm Promus DES post dilated to 3.35 mm with stenosis being reduced to 0%  . CARDIAC CATHETERIZATION  02/2008   Post dilatation was performed using a 2.75 x 9 [place] sprinter, 10 atmospheres for 40 seconds and then 9 atmosphere for 35  sec. This resulted in the 80% area of narrowing pre-intervention, now appearing to normal. There was no edvidence of the dissection or thrombus and there was TIMI III flow pre and post.  . CARDIAC CATHETERIZATION  02/2006  . CORONARY ANGIOPLASTY WITH STENT PLACEMENT     3 stents/ 4 caths  . EYE SURGERY    . KNEE ARTHROSCOPY     right  . LASIK    . LEFT HEART CATHETERIZATION WITH CORONARY ANGIOGRAM N/A 06/29/2013   Procedure: LEFT HEART CATHETERIZATION WITH CORONARY ANGIOGRAM;  Surgeon: Leonie Man, MD;  Location: [facility] CATH LAB;  Service: Cardiovascular;  Laterality: N/A;  . NASAL SINUS SURGERY  2001  . NECK SURGERY     Cervical fusion  . RADIOFREQUENCY ABLATION  May 2010   atrial fibrillation  . ROTATOR CUFF REPAIR     left  . SHOULDER OPEN ROTATOR CUFF REPAIR     right  . SPINE SURGERY    . WRIST ARTHROCENTESIS     left   Current Outpatient Prescriptions on File Prior to Visit  Medication Sig Dispense Refill  . aspirin 81 MG tablet Take 81 mg by mouth daily.    . carvedilol (COREG) 12.5 MG tablet Take 2 tablets (25 mg total) by mouth 2 (two) times daily with a meal. 360 tablet 3  . cholecalciferol  (VITAMIN D) 1000 UNITS tablet Take 1,000 Units by mouth daily.    . citalopram (CELEXA) 40 MG tablet Take 1 tablet (40 mg total) by mouth daily. 30 tablet 3  . clopidogrel (PLAVIX) 75 MG tablet TAKE 1 TABLET BY MOUTH  EVERY DAY 90 tablet 3  . Coenzyme Q10 (CO Q 10) 100 MG CAPS Take 1 capsule by mouth daily.    . diclofenac sodium (VOLTAREN) 1 % GEL Apply 2 g topically 4 (four) times daily. 100 g 1  . fluticasone (FLONASE) 50 MCG/ACT nasal spray INSTILL 2 SPRAYS IN THE NOSE AT BEDTIME 48 g 3  . furosemide (LASIX) 40 MG tablet TAKE 1 TABLET BY MOUTH  DAILY 90 tablet 3  . nitroGLYCERIN (NITROSTAT) 0.4 MG SL tablet Place 1 tablet (0.4 mg total) under the tongue every 5 (five) minutes as needed for chest pain. Need ov. 7 tablet 0  . oxyCODONE-acetaminophen (PERCOCET) 10-325 MG tablet Take 1 tablet by mouth every 6 (six) hours as needed for pain. 30 tablet 0  . pantoprazole (PROTONIX) 40 MG tablet TAKE 1 TABLET BY MOUTH  EVERY DAY 90 tablet 3  . potassium chloride (MICRO-K) 10 MEQ CR capsule Take 1 capsule by mouth  every day 90 capsule 2  . rosuvastatin (CRESTOR) 40 MG tablet Take 40 mg by mouth daily.    Marland Kitchen telmisartan (MICARDIS) 40 MG tablet Take by mouth daily. PT TAKES 20 MG    . UNABLE TO FIND CPAP therapy    . zolpidem (AMBIEN) 10 MG tablet Take 10 mg by mouth at bedtime as needed for sleep.     No current facility-administered medications on file prior to visit.    Allergies  Allergen Reactions  . Ibuprofen Swelling  . Other Shortness Of Breath    BETA BLOCKERS  . Topamax [Topiramate] Other (See Comments)    Pt states it made his tongue feel "scalded" and he couldn't eat   . Toprol Xl [Metoprolol Succinate] Other (See Comments)    Personality change   Social History   Social History  . Marital status: Married    Spouse name: N/A  . Number  of children: 2  . Years of education: N/A   Occupational History  . retired    Social History Main Topics  . Smoking status: Former Smoker      Quit date: 06/01/1987  . Smokeless tobacco: Never Used  . Alcohol use 0.0 oz/week     Comment: once every 6-7 months  . Drug use: No  . Sexual activity: Not on file   Other Topics Concern  . Not on file   Social History Narrative  . No narrative on file      Review of Systems  All other systems reviewed and are negative.      Objective:   Physical Exam  Cardiovascular: Normal rate, regular rhythm and normal heart sounds.   Pulmonary/Chest: Effort normal and breath sounds normal.  Musculoskeletal:       Right knee: He exhibits decreased range of motion and abnormal meniscus. He exhibits no effusion, no LCL laxity, normal patellar mobility and no MCL laxity. Tenderness found. Medial joint line tenderness noted. No lateral joint line, no MCL and no LCL tenderness noted.  Vitals reviewed.         Assessment & Plan:  Right medial knee pain. I suspect the patient has meniscal tear complicated by osteoarthritis  Using sterile technique, I injected the right knee with a mixture of 2 mL of lidocaine, 2 mL of Marcaine, and 2 mL of 40 mg per mL Kenalog. The patient tolerated the procedure well with no complication. Patient declines an MRI at the present time. He has no interest in surgery as long as the cortisone shots are working this effectively

## 2016-07-29 ENCOUNTER — Ambulatory Visit (INDEPENDENT_AMBULATORY_CARE_PROVIDER_SITE_OTHER): Payer: 59 | Admitting: Podiatry

## 2016-07-29 ENCOUNTER — Encounter: Payer: Self-pay | Admitting: Podiatry

## 2016-07-29 DIAGNOSIS — Q828 Other specified congenital malformations of skin: Secondary | ICD-10-CM

## 2016-07-31 NOTE — Progress Notes (Signed)
He presents today for chief complaint of a poor keratoma to the plantar aspect of his right foot.  Objective: Vital signs are stable he is alert and oriented 3 pulses remain palpable. Solitary poor keratoma just proximal and medial to the fifth metatarsal base of the right foot most exquisitely tender. Reactive hyperkeratosis is noted no skin breakdown.  Assessment: Porokeratosis right foot.  Plan: Debrided lesion today mechanically and placed Cantharone under occlusion to be washed off thoroughly tomorrow and I will follow-up with him as needed.

## 2016-08-18 ENCOUNTER — Other Ambulatory Visit: Payer: Self-pay | Admitting: Family Medicine

## 2016-09-10 ENCOUNTER — Other Ambulatory Visit: Payer: Self-pay | Admitting: Family Medicine

## 2016-09-10 DIAGNOSIS — E1165 Type 2 diabetes mellitus with hyperglycemia: Secondary | ICD-10-CM

## 2016-09-10 DIAGNOSIS — Z79899 Other long term (current) drug therapy: Secondary | ICD-10-CM

## 2016-09-10 DIAGNOSIS — Z0000 Encounter for general adult medical examination without abnormal findings: Secondary | ICD-10-CM

## 2016-09-10 DIAGNOSIS — I10 Essential (primary) hypertension: Secondary | ICD-10-CM

## 2016-09-10 DIAGNOSIS — E785 Hyperlipidemia, unspecified: Secondary | ICD-10-CM

## 2016-09-10 DIAGNOSIS — Z125 Encounter for screening for malignant neoplasm of prostate: Secondary | ICD-10-CM

## 2016-09-12 ENCOUNTER — Other Ambulatory Visit: Payer: Self-pay | Admitting: Family Medicine

## 2016-09-13 ENCOUNTER — Other Ambulatory Visit: Payer: 59

## 2016-09-13 DIAGNOSIS — Z79899 Other long term (current) drug therapy: Secondary | ICD-10-CM

## 2016-09-13 DIAGNOSIS — Z0000 Encounter for general adult medical examination without abnormal findings: Secondary | ICD-10-CM

## 2016-09-13 DIAGNOSIS — E1165 Type 2 diabetes mellitus with hyperglycemia: Secondary | ICD-10-CM

## 2016-09-13 DIAGNOSIS — E785 Hyperlipidemia, unspecified: Secondary | ICD-10-CM

## 2016-09-13 DIAGNOSIS — I10 Essential (primary) hypertension: Secondary | ICD-10-CM

## 2016-09-13 DIAGNOSIS — Z125 Encounter for screening for malignant neoplasm of prostate: Secondary | ICD-10-CM

## 2016-09-13 LAB — CBC WITH DIFFERENTIAL/PLATELET
BASOS ABS: 0 {cells}/uL (ref 0–200)
BASOS PCT: 0 %
Eosinophils Absolute: 140 cells/uL (ref 15–500)
Eosinophils Relative: 2 %
HEMATOCRIT: 41.7 % (ref 38.5–50.0)
Hemoglobin: 13.5 g/dL (ref 13.0–17.0)
LYMPHS PCT: 25 %
Lymphs Abs: 1750 cells/uL (ref 850–3900)
MCH: 28.7 pg (ref 27.0–33.0)
MCHC: 32.4 g/dL (ref 32.0–36.0)
MCV: 88.7 fL (ref 80.0–100.0)
MONOS PCT: 8 %
MPV: 8.9 fL (ref 7.5–12.5)
Monocytes Absolute: 560 cells/uL (ref 200–950)
NEUTROS PCT: 65 %
Neutro Abs: 4550 cells/uL (ref 1500–7800)
Platelets: 217 10*3/uL (ref 140–400)
RBC: 4.7 MIL/uL (ref 4.20–5.80)
RDW: 13.9 % (ref 11.0–15.0)
WBC: 7 10*3/uL (ref 3.8–10.8)

## 2016-09-13 LAB — COMPLETE METABOLIC PANEL WITH GFR
ALT: 22 U/L (ref 9–46)
AST: 20 U/L (ref 10–35)
Albumin: 3.8 g/dL (ref 3.6–5.1)
Alkaline Phosphatase: 54 U/L (ref 40–115)
BUN: 10 mg/dL (ref 7–25)
CO2: 28 mmol/L (ref 20–31)
Calcium: 9.1 mg/dL (ref 8.6–10.3)
Chloride: 98 mmol/L (ref 98–110)
Creat: 0.66 mg/dL — ABNORMAL LOW (ref 0.70–1.25)
GFR, Est African American: >89 mL/min (ref >=60)
GLUCOSE: 101 mg/dL — AB (ref 70–99)
POTASSIUM: 4.4 mmol/L (ref 3.5–5.3)
SODIUM: 134 mmol/L — AB (ref 135–146)
TOTAL PROTEIN: 6.2 g/dL (ref 6.1–8.1)
Total Bilirubin: 0.4 mg/dL (ref 0.2–1.2)

## 2016-09-13 LAB — LIPID PANEL
Cholesterol: 124 mg/dL (ref <200)
HDL: 49 mg/dL (ref >40)
LDL CALC: 65 mg/dL (ref <100)
TRIGLYCERIDES: 52 mg/dL (ref <150)
Total CHOL/HDL Ratio: 2.5 Ratio (ref <5.0)
VLDL: 10 mg/dL (ref <30)

## 2016-09-13 LAB — TSH: TSH: 1.73 mIU/L (ref 0.40–4.50)

## 2016-09-14 ENCOUNTER — Other Ambulatory Visit: Payer: Self-pay | Admitting: Cardiovascular Disease

## 2016-09-14 LAB — HEMOGLOBIN A1C
Hgb A1c MFr Bld: 6.4 % — ABNORMAL HIGH (ref <5.7)
Mean Plasma Glucose: 137 mg/dL

## 2016-09-14 LAB — PSA: PSA: 0.7 ng/mL (ref <=4.0)

## 2016-09-14 NOTE — Telephone Encounter (Signed)
Rx(s) sent to pharmacy electronically.  

## 2016-09-17 ENCOUNTER — Ambulatory Visit (INDEPENDENT_AMBULATORY_CARE_PROVIDER_SITE_OTHER): Payer: 59 | Admitting: Family Medicine

## 2016-09-17 ENCOUNTER — Encounter: Payer: Self-pay | Admitting: Family Medicine

## 2016-09-17 VITALS — BP 130/80 | HR 76 | Temp 97.9°F | Resp 18 | Ht 68.0 in | Wt 267.0 lb

## 2016-09-17 DIAGNOSIS — Z9861 Coronary angioplasty status: Secondary | ICD-10-CM

## 2016-09-17 DIAGNOSIS — I2510 Atherosclerotic heart disease of native coronary artery without angina pectoris: Secondary | ICD-10-CM | POA: Diagnosis not present

## 2016-09-17 DIAGNOSIS — Z0000 Encounter for general adult medical examination without abnormal findings: Secondary | ICD-10-CM | POA: Diagnosis not present

## 2016-09-17 DIAGNOSIS — G4733 Obstructive sleep apnea (adult) (pediatric): Secondary | ICD-10-CM

## 2016-09-17 DIAGNOSIS — R7303 Prediabetes: Secondary | ICD-10-CM

## 2016-09-17 DIAGNOSIS — L723 Sebaceous cyst: Secondary | ICD-10-CM | POA: Diagnosis not present

## 2016-09-17 DIAGNOSIS — I4891 Unspecified atrial fibrillation: Secondary | ICD-10-CM | POA: Diagnosis not present

## 2016-09-17 DIAGNOSIS — I10 Essential (primary) hypertension: Secondary | ICD-10-CM

## 2016-09-17 DIAGNOSIS — Z23 Encounter for immunization: Secondary | ICD-10-CM | POA: Diagnosis not present

## 2016-09-17 NOTE — Addendum Note (Signed)
Addended by: Shary Decamp B on: 09/17/2016 10:10 AM   Modules accepted: Orders

## 2016-09-17 NOTE — Progress Notes (Signed)
Subjective:    Patient ID: Lee [name], male    DOB: May 21, 1951, 66 y.o.   MRN: [id number]  HPI  Patient is a very pleasant 66 year old white male comes in today for complete physical exam.  His last tetanus shot was in 2013. He had a Zostavax in 2013. He had Pneumovax 23 in 2006. He received prevnar last year. Immunization History  Administered Date(s) Administered  . Influenza Split 03/04/2013, 02/28/2014  . Influenza-Unspecified 03/01/2015, 02/26/2016  . Pneumococcal Conjugate-13 05/29/2015  . Pneumococcal Polysaccharide-23 06/14/2004  . Tdap 03/30/2012  . Zoster 03/30/2012   He is due for his last pneumovax booster.  His last colonoscopy was 2012 and was normal. PSA was recently checked and was found to be 0.7 indicating no concern for prostate cancer. When I saw the patient back in January, he was a diabetic with hemoglobin A1c of 7.0. Since that time he is working extremely hard and has lost substantial weight.   Wt Readings from Last 3 Encounters:  09/17/16 267 lb (121.1 kg)  07/13/16 275 lb (124.7 kg)  06/04/16 284 lb (128.8 kg)   He has lost 17 pounds. I am very proud of the patient for this. He continues to have a subcutaneous mass on his left neck which is either a sebaceous cyst or possibly lymph node. Today on examination I really believe is a sebaceous cyst. He would like this removed. He also has been having more frequent PVCs. Past medical history is significant in this patient for atrial fibrillation and SVT. We have documented PVCs here recently last fall. He states it feels like the way he felt when he was having PVCs before. He has an appointment to see his cardiologist in June. He defers a cardiac monitor at the present time unless the symptoms worsen.   No visits with results within 1 Day(s) from this visit.  Latest known visit with results is:  Appointment on 09/13/2016  Component Date Value Ref Range Status  . Sodium 09/13/2016 134* 135 - 146 mmol/L Final    . Potassium 09/13/2016 4.4  3.5 - 5.3 mmol/L Final  . Chloride 09/13/2016 98  98 - 110 mmol/L Final  . CO2 09/13/2016 28  20 - 31 mmol/L Final  . Glucose, Bld 09/13/2016 101* 70 - 99 mg/dL Final  . BUN 09/13/2016 10  7 - 25 mg/dL Final  . Creat 09/13/2016 0.66* 0.70 - 1.25 mg/dL Final   Comment:   For patients > or = 67 years of age: The upper reference limit for Creatinine is approximately 13% higher for people identified as African-American.     . Total Bilirubin 09/13/2016 0.4  0.2 - 1.2 mg/dL Final  . Alkaline Phosphatase 09/13/2016 54  40 - 115 U/L Final  . AST 09/13/2016 20  10 - 35 U/L Final  . ALT 09/13/2016 22  9 - 46 U/L Final  . Total Protein 09/13/2016 6.2  6.1 - 8.1 g/dL Final  . Albumin 09/13/2016 3.8  3.6 - 5.1 g/dL Final  . Calcium 09/13/2016 9.1  8.6 - 10.3 mg/dL Final  . GFR, Est African American 09/13/2016 >89  >=60 mL/min Final  . GFR, Est Non African American 09/13/2016 >89  >=60 mL/min Final  . TSH 09/13/2016 1.73  0.40 - 4.50 mIU/L Final  . Cholesterol 09/13/2016 124  <200 mg/dL Final  . Triglycerides 09/13/2016 52  <150 mg/dL Final  . HDL 09/13/2016 49  >40 mg/dL Final  . Total CHOL/HDL Ratio 09/13/2016 2.5  <  5.0 Ratio Final  . VLDL 09/13/2016 10  <30 mg/dL Final  . LDL Cholesterol 09/13/2016 65  <100 mg/dL Final  . WBC 09/13/2016 7.0  3.8 - 10.8 K/uL Final  . RBC 09/13/2016 4.70  4.20 - 5.80 MIL/uL Final  . Hemoglobin 09/13/2016 13.5  13.0 - 17.0 g/dL Final  . HCT 09/13/2016 41.7  38.5 - 50.0 % Final  . MCV 09/13/2016 88.7  80.0 - 100.0 fL Final  . MCH 09/13/2016 28.7  27.0 - 33.0 pg Final  . MCHC 09/13/2016 32.4  32.0 - 36.0 g/dL Final  . RDW 09/13/2016 13.9  11.0 - 15.0 % Final  . Platelets 09/13/2016 217  140 - 400 K/uL Final  . MPV 09/13/2016 8.9  7.5 - 12.5 fL Final  . Neutro Abs 09/13/2016 4550  1,500 - 7,800 cells/uL Final  . Lymphs Abs 09/13/2016 1750  850 - 3,900 cells/uL Final  . Monocytes Absolute 09/13/2016 560  200 - 950 cells/uL Final   . Eosinophils Absolute 09/13/2016 140  15 - 500 cells/uL Final  . Basophils Absolute 09/13/2016 0  0 - 200 cells/uL Final  . Neutrophils Relative % 09/13/2016 65  % Final  . Lymphocytes Relative 09/13/2016 25  % Final  . Monocytes Relative 09/13/2016 8  % Final  . Eosinophils Relative 09/13/2016 2  % Final  . Basophils Relative 09/13/2016 0  % Final  . Smear Review 09/13/2016 Criteria for review not met   Final  . Hgb A1c MFr Bld 09/13/2016 6.4* <5.7 % Final   Comment:   For someone without known diabetes, a hemoglobin A1c value between 5.7% and 6.4% is consistent with prediabetes and should be confirmed with a follow-up test.   For someone with known diabetes, a value <7% indicates that their diabetes is well controlled. A1c targets should be individualized based on duration of diabetes, age, co-morbid conditions and other considerations.   This assay result is consistent with an increased risk of diabetes.   Currently, no consensus exists regarding use of hemoglobin A1c for diagnosis of diabetes in children.     . Mean Plasma Glucose 09/13/2016 137  mg/dL Final  . PSA 09/13/2016 0.7  <=4.0 ng/mL Final   Comment:   The total PSA value from this assay system is standardized against the WHO standard. The test result will be approximately 20% lower when compared to the equimolar-standardized total PSA ([employer]). Comparison of serial PSA results should be interpreted with this fact in mind.   This test was performed using the Siemens chemiluminescent method. Values obtained from different assay methods cannot be used interchangeably. PSA levels, regardless of value, should not be interpreted as absolute evidence of the presence or absence of disease.      Past Medical History:  Diagnosis Date  . Allergic rhinitis    chronic sinusitis  . Arthritis   . CAD (coronary artery disease)    a. 2009 PCI/DES to mLAD; b. 05/2010 s/p PCI RCA and LAD.; c. 05/2013 Cath: LAD  mild ISR, LCX nl, RCA 40p, patent stent.  . Cataract    has had lasik surg  . COPD (chronic obstructive pulmonary disease) ([place])   . Diabetes mellitus type 2, controlled, without complications ([place])   . Diverticulosis   . DJD (degenerative joint disease)   . Esophagitis 1991   grade 1  . GERD (gastroesophageal reflux disease)    Hiatal hernia  . Hemorrhoids   . Hyperlipidemia   . Hypertensive heart disease   .  Iron deficiency anemia   . Migraines   . Paroxysmal atrial fibrillation (HCC)    a. s/p afib ablation 10/22/08 ([facility]).  . PVC's (premature ventricular contractions)   . Sleep apnea    Questionalble: RDI during the total sleep time 6h 28 mins was 3.55/hr during REM sleep was at 5.71/hr. Supine AHI was 5.93/hr.   Current Outpatient Prescriptions on File Prior to Visit  Medication Sig Dispense Refill  . aspirin 81 MG tablet Take 81 mg by mouth daily.    . carvedilol (COREG) 12.5 MG tablet Take 2 tablets (25 mg total) by mouth 2 (two) times daily with a meal. 360 tablet 3  . cholecalciferol (VITAMIN D) 1000 UNITS tablet Take 1,000 Units by mouth daily.    . citalopram (CELEXA) 40 MG tablet TAKE 1 TABLET(40 MG) BY MOUTH DAILY 30 tablet 11  . clopidogrel (PLAVIX) 75 MG tablet TAKE 1 TABLET BY MOUTH  EVERY DAY 90 tablet 3  . Coenzyme Q10 (CO Q 10) 100 MG CAPS Take 1 capsule by mouth daily.    . diclofenac sodium (VOLTAREN) 1 % GEL Apply 2 g topically 4 (four) times daily. 100 g 1  . fluticasone (FLONASE) 50 MCG/ACT nasal spray INSTILL 2 SPRAYS IN THE NOSE AT BEDTIME 48 g 3  . furosemide (LASIX) 40 MG tablet TAKE 1 TABLET BY MOUTH  DAILY 90 tablet 3  . nitroGLYCERIN (NITROSTAT) 0.4 MG SL tablet Place 1 tablet (0.4 mg total) under the tongue every 5 (five) minutes as needed for chest pain. Need ov. 7 tablet 0  . oxyCODONE-acetaminophen (PERCOCET) 10-325 MG tablet Take 1 tablet by mouth every 6 (six) hours as needed for pain. 30 tablet 0  . pantoprazole (PROTONIX) 40 MG tablet TAKE  1 TABLET BY MOUTH  EVERY DAY 90 tablet 3  . potassium chloride (MICRO-K) 10 MEQ CR capsule TAKE 1 CAPSULE BY MOUTH  EVERY DAY 90 capsule 2  . rosuvastatin (CRESTOR) 40 MG tablet Take 40 mg by mouth daily.    Marland Kitchen telmisartan (MICARDIS) 40 MG tablet Take by mouth daily. PT TAKES 20 MG    . UNABLE TO FIND CPAP therapy    . zolpidem (AMBIEN) 10 MG tablet Take 10 mg by mouth at bedtime as needed for sleep.     No current facility-administered medications on file prior to visit.    Allergies  Allergen Reactions  . Ibuprofen Swelling  . Other Shortness Of Breath    BETA BLOCKERS  . Topamax [Topiramate] Other (See Comments)    Pt states it made his tongue feel "scalded" and he couldn't eat   . Toprol Xl [Metoprolol Succinate] Other (See Comments)    Personality change   Past Surgical History:  Procedure Laterality Date  . CARDIAC CATHETERIZATION  05/2010   The proximal LAD was then predilated with 2.0 x 12 trek. this was then stented with a 2.5 x 16 promus Element drug-eluting stent at 14 atmosphere and prostdilated with 2.75 x 12 [place] trek at 16 atmospheres(2.8 mm) resulting the reduction of 80% proximal LAD stenosis to 0% residual with excellent flow.  Marland Kitchen CARDIAC CATHETERIZATION  06/05/2010   Successful percutaneous coronary intervention to the right coronary artery with percutaneous transluminal coronary angioplasty/stenting and insertion 3.0 x 16 mm Promus DES post dilated to 3.35 mm with stenosis being reduced to 0%  . CARDIAC CATHETERIZATION  02/2008   Post dilatation was performed using a 2.75 x 9  sprinter, 10 atmospheres for 40 seconds and then 9  atmosphere for 35 sec. This resulted in the 80% area of narrowing pre-intervention, now appearing to normal. There was no edvidence of the dissection or thrombus and there was TIMI III flow pre and post.  . CARDIAC CATHETERIZATION  02/2006  . CORONARY ANGIOPLASTY WITH STENT PLACEMENT     3 stents/ 4 caths  . EYE SURGERY    . KNEE ARTHROSCOPY      right  . LASIK    . LEFT HEART CATHETERIZATION WITH CORONARY ANGIOGRAM N/A 06/29/2013   Procedure: LEFT HEART CATHETERIZATION WITH CORONARY ANGIOGRAM;  Surgeon: Leonie Man, MD;  Location: [facility] CATH LAB;  Service: Cardiovascular;  Laterality: N/A;  . NASAL SINUS SURGERY  2001  . NECK SURGERY     Cervical fusion  . RADIOFREQUENCY ABLATION  May 2010   atrial fibrillation  . ROTATOR CUFF REPAIR     left  . SHOULDER OPEN ROTATOR CUFF REPAIR     right  . SPINE SURGERY    . WRIST ARTHROCENTESIS     left   Social History   Social History  . Marital status: Married    Spouse name: N/A  . Number of children: 2  . Years of education: N/A   Occupational History  . retired    Social History Main Topics  . Smoking status: Former Smoker    Quit date: 06/01/1987  . Smokeless tobacco: Never Used  . Alcohol use 0.0 oz/week     Comment: once every 6-7 months  . Drug use: No  . Sexual activity: Not on file   Other Topics Concern  . Not on file   Social History Narrative  . No narrative on file   Family History  Problem Relation Age of Onset  . Heart disease Father     and sister  . Diabetes Father   . Colon cancer Mother     mets from uterine  . Uterine cancer Mother   . Heart disease Sister      Review of Systems  All other systems reviewed and are negative.      Objective:   Physical Exam  Constitutional: He is oriented to person, place, and time. He appears well-developed and well-nourished.  HENT:  Head: Normocephalic and atraumatic.  Right Ear: External ear normal.  Left Ear: External ear normal.  Nose: Nose normal.  Mouth/Throat: Oropharynx is clear and moist. No oropharyngeal exudate.  Eyes: Conjunctivae and EOM are normal. Pupils are equal, round, and reactive to light. Right eye exhibits no discharge. Left eye exhibits no discharge. No scleral icterus.  Neck: Normal range of motion. Neck supple. No JVD present. No tracheal deviation present.   Cardiovascular: Normal rate, regular rhythm, normal heart sounds and intact distal pulses.  Exam reveals no gallop and no friction rub.   No murmur heard. Pulmonary/Chest: Effort normal and breath sounds normal. No stridor. No respiratory distress. He has no wheezes. He has no rales. He exhibits no tenderness.  Abdominal: Soft. Bowel sounds are normal. He exhibits no distension and no mass. There is no tenderness. There is no rebound and no guarding.  Genitourinary: Rectum normal, prostate normal and penis normal.  Musculoskeletal: Normal range of motion. He exhibits no edema or tenderness.  Lymphadenopathy:    He has no cervical adenopathy.  Neurological: He is alert and oriented to person, place, and time. He has normal reflexes. No cranial nerve deficit. He exhibits normal muscle tone. Coordination normal.  Skin: Skin is warm. No rash noted. He is  not diaphoretic. No erythema. No pallor.  Psychiatric: He has a normal mood and affect. His behavior is normal. Judgment and thought content normal.  Vitals reviewed.         Assessment & Plan:  Prediabetes  Essential hypertension  CAD S/P percutaneous coronary angioplasty  Atrial fibrillation, unspecified type (HCC)  OSA (obstructive sleep apnea)  Morbid obesity ([place])  General medical exam  I'm extremely prior this patient for losing 17 pounds. His diabetic test is much better today at 6.4 and I anticipate that we continue to improve with time as long as he keeps up the hard work. I will consult general surgery to remove what I believe is a sebaceous cyst versus lymph node. Patient defers a cardiac monitor at the present time in favor of waiting to see his cardiologist. If the palpitations worsen, I would like to schedule the patient for cardiac monitor immediately. However I believe is having PVCs. His blood pressure today is well controlled. The remainder of his lab work is excellent. He defers hepatitis C screening. He will receive  Pneumovax 23 today.

## 2016-09-21 ENCOUNTER — Ambulatory Visit (INDEPENDENT_AMBULATORY_CARE_PROVIDER_SITE_OTHER): Payer: 59 | Admitting: Podiatry

## 2016-09-21 ENCOUNTER — Encounter: Payer: Self-pay | Admitting: Podiatry

## 2016-09-21 DIAGNOSIS — Q828 Other specified congenital malformations of skin: Secondary | ICD-10-CM | POA: Diagnosis not present

## 2016-09-21 NOTE — Progress Notes (Signed)
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  Objective: Vital signs are stable he is alert and oriented 3 porokeratosis on the left foot and gone on to resolve right foot does demonstrate 4 porokeratotic lesions which were sharply debrided today bleeding was noted.  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 Left foot is completely resolved.  Plan: Mechanical and chemical debridement of lesions stable follow-up with him on an as-needed basis.

## 2016-09-23 ENCOUNTER — Ambulatory Visit: Payer: 59 | Admitting: Podiatry

## 2016-10-14 ENCOUNTER — Other Ambulatory Visit: Payer: Self-pay | Admitting: Otolaryngology

## 2016-10-14 DIAGNOSIS — R221 Localized swelling, mass and lump, neck: Secondary | ICD-10-CM | POA: Insufficient documentation

## 2016-10-21 ENCOUNTER — Ambulatory Visit
Admission: RE | Admit: 2016-10-21 | Discharge: 2016-10-21 | Disposition: A | Discharge status: Home / Self Care | Payer: 59 | Source: Ambulatory Visit | Attending: Otolaryngology | Admitting: Otolaryngology

## 2016-10-21 DIAGNOSIS — R221 Localized swelling, mass and lump, neck: Secondary | ICD-10-CM

## 2016-10-21 MED ORDER — IOPAMIDOL (ISOVUE-300) INJECTION 61%
75.0000 mL | Freq: Once | INTRAVENOUS | Status: AC | PRN
  Administered 2016-10-21: 75 mL via INTRAVENOUS

## 2016-11-10 ENCOUNTER — Telehealth: Payer: Self-pay | Admitting: Family Medicine

## 2016-11-10 ENCOUNTER — Other Ambulatory Visit: Payer: Self-pay | Admitting: *Deleted

## 2016-11-10 MED ORDER — ZOLPIDEM TARTRATE 10 MG PO TABS: 10.0000 mg | ORAL_TABLET | Freq: Every evening | ORAL | 1 refills | Status: DC | PRN

## 2016-11-10 NOTE — Telephone Encounter (Signed)
Pt needs refill on oxycodone.  °

## 2016-11-10 NOTE — Telephone Encounter (Signed)
Refilled Zolpidem 10 mg #30 with 1 additional refill to [facility] on [address].

## 2016-11-10 NOTE — Telephone Encounter (Signed)
Ok to refill 

## 2016-11-11 MED ORDER — OXYCODONE-ACETAMINOPHEN 10-325 MG PO TABS: 1.0000 | ORAL_TABLET | Freq: Four times a day (QID) | ORAL | 0 refills | Status: DC | PRN

## 2016-11-11 NOTE — Telephone Encounter (Signed)
ok 

## 2016-11-11 NOTE — Telephone Encounter (Signed)
Prescription printed and patient made aware to come to office to pick up after 4pm on 11/11/2016.

## 2016-11-12 ENCOUNTER — Telehealth: Payer: Self-pay | Admitting: Family Medicine

## 2016-11-12 MED ORDER — OXYCODONE-ACETAMINOPHEN 10-325 MG PO TABS: 1.0000 | ORAL_TABLET | Freq: Three times a day (TID) | ORAL | 0 refills | Status: DC | PRN

## 2016-11-12 NOTE — Telephone Encounter (Signed)
Per pt he can only get 21 tablets per month d/t insurance changes. Called and spoke to pharm and this is new insurance rule on controlled pain medication. It is limited to 3 tabs a day or 21 tabs per month.  New RX printed left up front and pt aware.

## 2016-11-16 ENCOUNTER — Ambulatory Visit (INDEPENDENT_AMBULATORY_CARE_PROVIDER_SITE_OTHER): Payer: 59 | Admitting: Podiatry

## 2016-11-16 DIAGNOSIS — Q828 Other specified congenital malformations of skin: Secondary | ICD-10-CM

## 2016-11-16 NOTE — Progress Notes (Signed)
He presents today follow-up for his porokeratosis. Since they're doing much better.  Objective: Vital signs are stable alert and oriented 31 solitary poor keratoma left on the plantar aspect of the right foot. I debrided that for him today.  Assessment: Porokeratosis.  Plan: Debrided reactive hyperkeratotic lesion. Follow-up with me.

## 2016-11-21 ENCOUNTER — Other Ambulatory Visit: Payer: Self-pay | Admitting: Cardiovascular Disease

## 2016-11-26 ENCOUNTER — Encounter: Payer: Self-pay | Admitting: Cardiovascular Disease

## 2016-11-26 ENCOUNTER — Ambulatory Visit (INDEPENDENT_AMBULATORY_CARE_PROVIDER_SITE_OTHER): Payer: 59 | Admitting: Cardiovascular Disease

## 2016-11-26 VITALS — BP 136/88 | HR 65 | Ht 68.0 in | Wt 275.0 lb

## 2016-11-26 DIAGNOSIS — I2510 Atherosclerotic heart disease of native coronary artery without angina pectoris: Secondary | ICD-10-CM | POA: Diagnosis not present

## 2016-11-26 DIAGNOSIS — I440 Atrioventricular block, first degree: Secondary | ICD-10-CM | POA: Diagnosis not present

## 2016-11-26 DIAGNOSIS — I10 Essential (primary) hypertension: Secondary | ICD-10-CM | POA: Diagnosis not present

## 2016-11-26 DIAGNOSIS — G4733 Obstructive sleep apnea (adult) (pediatric): Secondary | ICD-10-CM | POA: Diagnosis not present

## 2016-11-26 DIAGNOSIS — I4510 Unspecified right bundle-branch block: Secondary | ICD-10-CM | POA: Diagnosis not present

## 2016-11-26 DIAGNOSIS — R002 Palpitations: Secondary | ICD-10-CM | POA: Diagnosis not present

## 2016-11-26 MED ORDER — DILTIAZEM HCL ER COATED BEADS 120 MG PO CP24: 120.0000 mg | ORAL_CAPSULE | Freq: Every day | ORAL | 3 refills | Status: DC

## 2016-11-26 NOTE — Progress Notes (Signed)
Patient ID: Lee [name], male   DOB: 09/01/1950, 66 y.o.   MRN: [id number]     Primary M.D.: Lee [name]  HPI: Lee [name], is a 66 y.o. male who presents to the office today for an 40 month followup cardiology evaluation.  Lee [name] has known CAD and in 2009 underwent stenting of his mid LAD. In 2012 he underwent staged intervention with stenting of his proximal RCA and at a new site in his proximal LAD. A nuclear perfusion study February 2014 showed normal perfusion and function. In January 2015, he was seen by Lee [name] in the office with complaints of chest pressure. He underwent cardiac catheterization this chest pain which was done by Lee [name] on 06/29/2013. Catheterization did not demonstrate significant restenosis of his LAD stents with mild 20% narrowing in the proximal stent and 40% mid stent. The circumflex was normal. His RCA had 40% narrowing just proximal to a widely patent stent in the mid vessel. Nitrate therapy was added to his medical regimen but due to significant nitrate mediated headaches this ultimately was discontinued.   In 2007, a sleep study which suggested upper airway resistance syndrome without overall sleep apnea with the exception of mild sleep apnea with REM sleep at that time CPAP was not recommended. In June 2014 he was having symptoms suggestive of progressive sleep apnea. He ultimately underwent a sleep study. I do not have the specifics of this diagnostic polysomnogram but subsequently he did undergo a CPAP titration to apparently his AHI was 12 on his initial study and he dropped his oxygen from 96% to 83%. He ultimately was titrated up to a CPAP pressure of 11 cm.  Additional problems also include obesity , as well as a history of palpitations.  I had titrated his beta blocker therapy which improved his palpitations.  He remains active.  He denies any episodes of chest pain.  He denies presyncope or syncope.  A follow-up myocardial  perfusion study on 09/23/2015 showed an ejection fraction at 62%.  There were no ECG changes.  He had normal perfusion without scar or ischemia.  Since I last saw him, he continues with 100% compliance with CPAP.  At times he has difficulty with sleep initiation and takes zolpidem on an as-needed basis.  Is DME company is  [employer] for his DME company.  He denies any chest pain.  He does experience some shortness of breath, nonexertional.  He associated with palpitations.  He has been feeling palpitations several times a day.  He has tried to reduce his caffeine intake.  He denies associated presyncope or syncope.  He presents for evaluation.  Past Medical History:  Diagnosis Date  . Allergic rhinitis    chronic sinusitis  . Arthritis   . CAD (coronary artery disease)    a. 2009 PCI/DES to mLAD; b. 05/2010 s/p PCI RCA and LAD.; c. 05/2013 Cath: LAD mild ISR, LCX nl, RCA 40p, patent stent.  . Cataract    has had lasik surg  . COPD (chronic obstructive pulmonary disease) ([place])   . Diabetes mellitus type 2, controlled, without complications ([place])   . Diverticulosis   . DJD (degenerative joint disease)   . Esophagitis 1991   grade 1  . GERD (gastroesophageal reflux disease)    Hiatal hernia  . Hemorrhoids   . Hyperlipidemia   . Hypertensive heart disease   . Iron deficiency anemia   . Migraines   . Paroxysmal atrial fibrillation (H[facility])  a. s/p afib ablation 10/22/08 (Lee [name]).  . PVC's (premature ventricular contractions)   . Sleep apnea    Questionalble: RDI during the total sleep time 6h 28 mins was 3.55/hr during REM sleep was at 5.71/hr. Supine AHI was 5.93/hr.    Past Surgical History:  Procedure Laterality Date  . CARDIAC CATHETERIZATION  05/2010   The proximal LAD was then predilated with 2.0 x 12 trek. this was then stented with a 2.5 x 16 promus Element drug-eluting stent at 14 atmosphere and prostdilated with 2.75 x 12 [place] trek at 16 atmospheres(2.8 mm) resulting the  reduction of 80% proximal LAD stenosis to 0% residual with excellent flow.  Marland Kitchen CARDIAC CATHETERIZATION  06/05/2010   Successful percutaneous coronary intervention to the right coronary artery with percutaneous transluminal coronary angioplasty/stenting and insertion 3.0 x 16 mm Promus DES post dilated to 3.35 mm with stenosis being reduced to 0%  . CARDIAC CATHETERIZATION  02/2008   Post dilatation was performed using a 2.75 x 9 [place] sprinter, 10 atmospheres for 40 seconds and then 9 atmosphere for 35 sec. This resulted in the 80% area of narrowing pre-intervention, now appearing to normal. There was no edvidence of the dissection or thrombus and there was TIMI III flow pre and post.  . CARDIAC CATHETERIZATION  02/2006  . CORONARY ANGIOPLASTY WITH STENT PLACEMENT     3 stents/ 4 caths  . EYE SURGERY    . KNEE ARTHROSCOPY     right  . LASIK    . LEFT HEART CATHETERIZATION WITH CORONARY ANGIOGRAM N/A 06/29/2013   Procedure: LEFT HEART CATHETERIZATION WITH CORONARY ANGIOGRAM;  Surgeon: Lee Man, MD;  Location: [facility] CATH LAB;  Service: Cardiovascular;  Laterality: N/A;  . NASAL SINUS SURGERY  2001  . NECK SURGERY     Cervical fusion  . RADIOFREQUENCY ABLATION  May 2010   atrial fibrillation  . ROTATOR CUFF REPAIR     left  . SHOULDER OPEN ROTATOR CUFF REPAIR     right  . SPINE SURGERY    . WRIST ARTHROCENTESIS     left    Allergies  Allergen Reactions  . Ibuprofen Swelling  . Other Shortness Of Breath    BETA BLOCKERS  . Topamax [Topiramate] Other (See Comments)    Pt states it made his tongue feel "scalded" and he couldn't eat   . Toprol Xl [Metoprolol Succinate] Other (See Comments)    Personality change    Current Outpatient Prescriptions  Medication Sig Dispense Refill  . aspirin 81 MG tablet Take 81 mg by mouth daily.    . carvedilol (COREG) 12.5 MG tablet Take 2 tablets (25 mg total) by mouth 2 (two) times daily with a meal. 360 tablet 3  . cholecalciferol (VITAMIN D)  1000 UNITS tablet Take 1,000 Units by mouth daily.    . citalopram (CELEXA) 40 MG tablet TAKE 1 TABLET(40 MG) BY MOUTH DAILY 30 tablet 11  . clopidogrel (PLAVIX) 75 MG tablet TAKE 1 TABLET BY MOUTH  EVERY DAY 90 tablet 3  . Coenzyme Q10 (CO Q 10) 100 MG CAPS Take 1 capsule by mouth daily.    . diclofenac sodium (VOLTAREN) 1 % GEL Apply 2 g topically 4 (four) times daily. 100 g 1  . fluticasone (FLONASE) 50 MCG/ACT nasal spray INSTILL 2 SPRAYS IN THE NOSE AT BEDTIME 48 g 3  . furosemide (LASIX) 40 MG tablet TAKE 1 TABLET BY MOUTH  DAILY 90 tablet 3  . nitroGLYCERIN (NITROSTAT) 0.4 MG  SL tablet PLACE ONE TABLET UNDER THE TONGUE EVERY 5 MINUTES AS NEEDED FOR CHEST PAIN 25 tablet 3  . oxyCODONE-acetaminophen (PERCOCET) 10-325 MG tablet Take 1 tablet by mouth every 8 (eight) hours as needed for pain. 21 tablet 0  . pantoprazole (PROTONIX) 40 MG tablet TAKE 1 TABLET BY MOUTH  EVERY DAY 90 tablet 3  . potassium chloride (MICRO-K) 10 MEQ CR capsule TAKE 1 CAPSULE BY MOUTH  EVERY DAY 90 capsule 2  . rosuvastatin (CRESTOR) 40 MG tablet Take 40 mg by mouth daily.    Marland Kitchen telmisartan (MICARDIS) 40 MG tablet Take 0.5 tablets by mouth 2 (two) times daily. PT TAKES 20 MG     . UNABLE TO FIND CPAP therapy    . zolpidem (AMBIEN) 10 MG tablet Take 1 tablet (10 mg total) by mouth at bedtime as needed for sleep. 30 tablet 1  . diltiazem (CARDIZEM CD) 120 MG 24 hr capsule Take 1 capsule (120 mg total) by mouth at bedtime. 90 capsule 3   No current facility-administered medications for this visit.     Socially he's married has 2 children he works in Development worker, international aid. There is a remote tobacco history but he quit in 1989.  ROS General: Negative; No fevers, chills, or night sweats; positive for obesity HEENT: Negative; No changes in vision or hearing, sinus congestion, difficulty swallowing Pulmonary: Negative; No cough, wheezing, shortness of breath, hemoptysis Cardiovascular: See history of present illness;  GI:  Negative; No nausea, vomiting, diarrhea, or abdominal pain GU: Positive for GERD; No dysuria, hematuria, or difficulty voiding Musculoskeletal: Negative; no myalgias, joint pain, or weakness Hematologic/Oncology: Negative; no easy bruising, bleeding Endocrine: Negative; no heat/cold intolerance; no diabetes Neuro: Positive for history of migraine headaches; no changes in balance,  Skin: Negative; No rashes or skin lesions Psychiatric: Negative; No behavioral problems, depression Sleep: Positive for obstructive sleep apnea, now on CPAP therapy; No snoring, daytime sleepiness, hypersomnolence, bruxism, restless legs, hypnogognic hallucinations, no cataplexy Other comprehensive 14 point system review is negative.  PE BP 136/88   Pulse 65   Ht _0  (1.727 m)   Wt 275 lb (124.7 kg)   BMI 41.81 kg/m   Morbid obesity  Repeat blood pressure by me was 140/82  Wt Readings from Last 3 Encounters:  11/26/16 275 lb (124.7 kg)  09/17/16 267 lb (121.1 kg)  07/13/16 275 lb (124.7 kg)   General: Alert, oriented, no distress.  Skin: normal turgor, no rashes, warm and dry HEENT: Normocephalic, atraumatic. Pupils equal round and reactive to light; sclera anicteric; extraocular muscles intact; Nose without nasal septal hypertrophy Mouth/Parynx benign; Mallinpatti scale 3 Neck: No JVD, no carotid bruits; normal carotid upstroke Lungs: clear to ausculatation and percussion; no wheezing or rales Chest wall: without tenderness to palpitation Heart: PMI not displaced, RRR, s1 s2 normal, 1/6 systolic murmur, no diastolic murmur, no rubs, gallops, thrills, or heaves Abdomen: soft, nontender; no hepatosplenomehaly, BS+; abdominal aorta nontender and not dilated by palpation. Back: no CVA tenderness Pulses 2+ Musculoskeletal: full range of motion, normal strength, no joint deformities Extremities: no clubbing cyanosis or edema, Homan's sign negative  Neurologic: grossly nonfocal; Cranial nerves grossly  wnl Psychologic: Normal mood and affect; normal cognition   ECG (independently read by me): Normal sinus rhythm at 65 bpm.  First degree AV block.  Right bundle branch block with repolarization changes.  PR interval 218 ms.  July 2017 ECG (independently read by me): Sinus rhythm at 69 bpm with right bundle branch block  and first-degree AV block.  Q wave is present in lead 3.  April 2016 ECG (independently read by me): Normal sinus rhythm at 68 with right bundle branch block and repolarization changes.  First-degree AV block.  No ectopy  October 2015 ECG (independently read by me): Normal sinus rhythm with right bundle branch block.  Borderline first-degree AV block with PR interval 206 ms.  No ectopy  Prior February 2015 ECG (independently read by me): Normal sinus rhythm at 66 beats per minute, right bundle branch block with repolarization changes, first-degree AV block.  ECG from 12/12/2012: NSR at 69, RBBB  LABS:  BMP Latest Ref Rng & Units 09/13/2016 06/04/2016 12/15/2015  Glucose 70 - 99 mg/dL 101(H) 199(H) 114(H)  BUN 7 - 25 mg/dL _0 Creatinine 0.70 - 1.25 mg/dL 0.66(L) 0.64(L) 0.92  Sodium 135 - 146 mmol/L 134(L) 133(L) 138  Potassium 3.5 - 5.3 mmol/L 4.4 4.3 4.8  Chloride 98 - 110 mmol/L 98 98 102  CO2 20 - 31 mmol/L _1 Calcium 8.6 - 10.3 mg/dL 9.1 8.9 9.1    Hepatic Function Latest Ref Rng & Units 09/13/2016 06/04/2016 12/15/2015  Total Protein 6.1 - 8.1 g/dL 6.2 6.3 6.2  Albumin 3.6 - 5.1 g/dL 3.8 4.2 4.0  AST 10 - 35 U/L _2 ALT 9 - 46 U/L _3 Alk Phosphatase 40 - 115 U/L 54 55 72  Total Bilirubin 0.2 - 1.2 mg/dL 0.4 0.5 0.4    CBC Latest Ref Rng & Units 09/13/2016 06/04/2016 12/15/2015  WBC 3.8 - 10.8 K/uL 7.0 7.8 5.9  Hemoglobin 13.0 - 17.0 g/dL 13.5 13.7 13.1(L)  Hematocrit 38.5 - 50.0 % 41.7 42.3 40.1  Platelets 140 - 400 K/uL 217 201 206   Lab Results  Component Value Date   MCV 88.7 09/13/2016   MCV 89.8 06/04/2016   MCV 87.7 12/15/2015     BNP    Component Value Date/Time   PROBNP 36.0 06/04/2010 2206    Lipid Panel     Component Value Date/Time   CHOL 124 09/13/2016 0822   CHOL 155 11/21/2012 1025   TRIG 52 09/13/2016 0822   TRIG 81 11/21/2012 1025   HDL 49 09/13/2016 0822   HDL 45 11/21/2012 1025   CHOLHDL 2.5 09/13/2016 0822   VLDL 10 09/13/2016 0822   LDLCALC 65 09/13/2016 0822   LDLCALC 94 11/21/2012 1025     RADIOLOGY: No results found.  IMPRESSION:  1. Essential hypertension   2. CAD in native artery   3. Palpitations   4. OSA (obstructive sleep apnea)   5. RBBB   6. First degree heart block   7. Morbid obesity ([place])     ASSESSMENT AND PLAN: Mr. Mccleod Is a 66 year old male who has CAD and is status post intervention to his proximal and mid LAD as well as his RCA.  A repeat cardiac catheterization by Dr. Ellyn Hack did not demonstrate significant cardiac etiology to his chest pain. He was empirically started on nitrate therapy but this has resulted in frequent migraine headaches. It is possible that some of his pain may have been due to spasm versus esophageal etiology or musculoskeletal symptomatology.  He denies any anginal symptoms with reference to his CAD.  His blood pressure today on repeat by me was 140/88 on carvedilol 25 mg twice a day, was some eye 40 mg daily, and he has been taken telmisartan 20 mg twice a day.  He  has experienced frequent palpitations at times, these seem to be occurring 3 times a day.  He has reduced his caffeine intake.  I have recommended trying to discontinue caffeine altogether.  I elected to add low-dose Cardizem CD 120 mg at bedtime to see if this improves his symptomatology.  He has first-degree AV block and right bundle branch block but this is stable.  He continues to take aspirin and Plavix for dual antiplatelet therapy in light of his CAD.  He is morbidly obese and weight reduction was recommended.  He also has a long-standing history of sleep apnea and  continues to be 100% compliant with CPAP use.  At times he has some difficulty with sleep initiation for which he takes Lopid and 10 mg on an as-needed basis.  He has GERD but this is controlled with Protonix.  He will monitor his blood pressure.  I reviewed his laboratory from April 2018.  LDL is 65.  As long as he is stable, I will see him in 6 months for reevaluation.  Time spent: 25 minutes Troy Sine, MD, [facility]  11/28/2016 1:23 PM

## 2016-11-26 NOTE — Patient Instructions (Addendum)
Your physician has recommended you make the following change in your medication:   1.) start new prescription given today for diltiazem as directed on the bottle.  2.) continue the micardis at 20 mg daily. If blood pressure stays up increase to 20 mg twice a day.   Your physician wants you to follow-up in: 6 months or sooner if needed. You will receive a reminder letter in the mail two months in advance. If you don't receive a letter, please call our office to schedule the follow-up appointment.  If you need a refill on your cardiac medications before your next appointment, please call your pharmacy.

## 2016-12-02 ENCOUNTER — Ambulatory Visit (INDEPENDENT_AMBULATORY_CARE_PROVIDER_SITE_OTHER): Payer: 59 | Admitting: Physician Assistant

## 2016-12-02 ENCOUNTER — Encounter: Payer: Self-pay | Admitting: Physician Assistant

## 2016-12-02 VITALS — BP 152/82 | HR 75 | Temp 98.0°F | Resp 18 | Ht 68.0 in | Wt 277.8 lb

## 2016-12-02 DIAGNOSIS — J988 Other specified respiratory disorders: Secondary | ICD-10-CM | POA: Diagnosis not present

## 2016-12-02 DIAGNOSIS — B9689 Other specified bacterial agents as the cause of diseases classified elsewhere: Principal | ICD-10-CM

## 2016-12-02 MED ORDER — HYDROCODONE-HOMATROPINE 5-1.5 MG/5ML PO SYRP: 5.0000 mL | ORAL_SOLUTION | Freq: Three times a day (TID) | ORAL | 0 refills | Status: DC | PRN

## 2016-12-02 MED ORDER — AZITHROMYCIN 250 MG PO TABS: ORAL_TABLET | ORAL | 0 refills | Status: DC

## 2016-12-02 NOTE — Progress Notes (Signed)
Patient ID: ELICEO GLADU MRN: [id number], DOB: 12-24-1950, 66 y.o. Date of Encounter: 12/02/2016, 4:01 PM    Chief Complaint:  Chief Complaint  Patient presents with  . Cough    x1week     HPI: 66 y.o. year old male reports that he has been sick for over a week now. Says that he has been having both cough and sinus congestion. Says that he is coughing up thick dark phlegm and also blowing thick dark mucus from his nose. No significant sore throat. No fevers or chills. Says that the cough is keeping him awake at night and is not getting much sleep at all which is making him feel even worse.     Home Meds:   Outpatient Medications Prior to Visit  Medication Sig Dispense Refill  . aspirin 81 MG tablet Take 81 mg by mouth daily.    . carvedilol (COREG) 12.5 MG tablet Take 2 tablets (25 mg total) by mouth 2 (two) times daily with a meal. 360 tablet 3  . cholecalciferol (VITAMIN D) 1000 UNITS tablet Take 1,000 Units by mouth daily.    . citalopram (CELEXA) 40 MG tablet TAKE 1 TABLET(40 MG) BY MOUTH DAILY 30 tablet 11  . clopidogrel (PLAVIX) 75 MG tablet TAKE 1 TABLET BY MOUTH  EVERY DAY 90 tablet 3  . Coenzyme Q10 (CO Q 10) 100 MG CAPS Take 1 capsule by mouth daily.    . diclofenac sodium (VOLTAREN) 1 % GEL Apply 2 g topically 4 (four) times daily. 100 g 1  . diltiazem (CARDIZEM CD) 120 MG 24 hr capsule Take 1 capsule (120 mg total) by mouth at bedtime. 90 capsule 3  . fluticasone (FLONASE) 50 MCG/ACT nasal spray INSTILL 2 SPRAYS IN THE NOSE AT BEDTIME 48 g 3  . furosemide (LASIX) 40 MG tablet TAKE 1 TABLET BY MOUTH  DAILY 90 tablet 3  . nitroGLYCERIN (NITROSTAT) 0.4 MG SL tablet PLACE ONE TABLET UNDER THE TONGUE EVERY 5 MINUTES AS NEEDED FOR CHEST PAIN 25 tablet 3  . oxyCODONE-acetaminophen (PERCOCET) 10-325 MG tablet Take 1 tablet by mouth every 8 (eight) hours as needed for pain. 21 tablet 0  . pantoprazole (PROTONIX) 40 MG tablet TAKE 1 TABLET BY MOUTH  EVERY DAY 90 tablet 3  .  potassium chloride (MICRO-K) 10 MEQ CR capsule TAKE 1 CAPSULE BY MOUTH  EVERY DAY 90 capsule 2  . rosuvastatin (CRESTOR) 40 MG tablet Take 40 mg by mouth daily.    Marland Kitchen telmisartan (MICARDIS) 40 MG tablet Take 0.5 tablets by mouth 2 (two) times daily. PT TAKES 20 MG     . UNABLE TO FIND CPAP therapy    . zolpidem (AMBIEN) 10 MG tablet Take 1 tablet (10 mg total) by mouth at bedtime as needed for sleep. 30 tablet 1   No facility-administered medications prior to visit.     Allergies:  Allergies  Allergen Reactions  . Ibuprofen Swelling  . Other Shortness Of Breath    BETA BLOCKERS  . Topamax [Topiramate] Other (See Comments)    Pt states it made his tongue feel "scalded" and he couldn't eat   . Toprol Xl [Metoprolol Succinate] Other (See Comments)    Personality change      Review of Systems: See HPI for pertinent ROS. All other ROS negative.    Physical Exam: Blood pressure (!) 152/82, pulse 75, temperature 98 F (36.7 C), temperature source Oral, resp. rate 18, height 5\' 8"  (1.727 m), weight 277 lb  12.8 oz (126 kg), SpO2 98 %., Body mass index is 42.24 kg/m. General:  Obese WM. Appears in no acute distress. HEENT: Normocephalic, atraumatic, eyes without discharge, sclera non-icteric, nares are without discharge. Bilateral auditory canals clear, TM's are without perforation, pearly grey and translucent with reflective cone of light bilaterally. Oral cavity moist, posterior pharynx without exudate, erythema, peritonsillar abscess. No tenderness with percussion to frontal or maxillary sinuses bilaterally.  Neck: Supple. No thyromegaly. No lymphadenopathy. Lungs: Clear bilaterally to auscultation without wheezes, rales, or rhonchi. Breathing is unlabored. Heart: Regular rhythm. No murmurs, rubs, or gallops. Msk:  Strength and tone normal for age. Extremities/Skin: Warm and dry. Neuro: Alert and oriented X 3. Moves all extremities spontaneously. Gait is normal. CNII-XII grossly in  tact. Psych:  Responds to questions appropriately with a normal affect.     ASSESSMENT AND PLAN:  66 y.o. year old male with  1. Bacterial respiratory infection He is to take antibiotic as directed and complete all of it. Follow-up if symptoms not resolved within 1 week after completion of antibiotic. Can use cough suppressant especially at night so he can get sleep. Follow-up if needed. - azithromycin (ZITHROMAX) 250 MG tablet; Day 1: Take 2 daily. Days 2 - 5: Take 1 daily.  Dispense: 6 tablet; Refill: 0 - HYDROcodone-homatropine (HYCODAN) 5-1.5 MG/5ML syrup; Take 5 mLs by mouth every 8 (eight) hours as needed for cough.  Dispense: 120 mL; Refill: 0   Signed, [id number] [place], [place], [facility] 12/02/2016 4:01 PM

## 2016-12-11 ENCOUNTER — Other Ambulatory Visit: Payer: Self-pay | Admitting: Cardiovascular Disease

## 2016-12-30 ENCOUNTER — Ambulatory Visit (INDEPENDENT_AMBULATORY_CARE_PROVIDER_SITE_OTHER): Payer: 59 | Admitting: Family Medicine

## 2016-12-30 ENCOUNTER — Encounter: Payer: Self-pay | Admitting: Family Medicine

## 2016-12-30 VITALS — BP 146/80 | HR 84 | Temp 98.5°F | Resp 18 | Ht 68.0 in | Wt 277.0 lb

## 2016-12-30 DIAGNOSIS — R05 Cough: Secondary | ICD-10-CM

## 2016-12-30 DIAGNOSIS — G43701 Chronic migraine without aura, not intractable, with status migrainosus: Secondary | ICD-10-CM

## 2016-12-30 DIAGNOSIS — M5432 Sciatica, left side: Secondary | ICD-10-CM

## 2016-12-30 DIAGNOSIS — R053 Chronic cough: Secondary | ICD-10-CM

## 2016-12-30 MED ORDER — TRAMADOL HCL 50 MG PO TABS: 50.0000 mg | ORAL_TABLET | Freq: Three times a day (TID) | ORAL | 0 refills | Status: DC | PRN

## 2016-12-30 MED ORDER — PREDNISONE 20 MG PO TABS: ORAL_TABLET | ORAL | 0 refills | Status: DC

## 2016-12-30 NOTE — Progress Notes (Signed)
Subjective:    Patient ID: Lee [name], male    DOB: 12/22/1950, 66 y.o.   MRN: [id number]  HPI The patient has been having severe pain shoots from his lower back into his posterior left hip and down to the superior left thigh for more than 2 weeks. He has pain with range of motion in his lower back. He has pain with standing. He denies any numbness or tingling in his feet. He denies any bowel or bladder incontinence. He is also been suffering with a chronic headache for more than a week. The headache is located on the crown of his head. It is pulsatile. There is photophobia and nausea associated with it. He has a history of migraines and states that he gets a migraine 1-2 times a week however this particular headache has persisted for more than a week. He is tried hydrocodone with no benefit. He also reports a chronic cough for several months. He describes it as a tickle in his throat. He denies any productive cough. He denies any chest pain shortness of breath dyspnea on exertion. He denies any fevers or chills. He denies any hemoptysis. He denies any pleurisy Past Medical History:  Diagnosis Date  . Allergic rhinitis    chronic sinusitis  . Arthritis   . CAD (coronary artery disease)    a. 2009 PCI/DES to mLAD; b. 05/2010 s/p PCI RCA and LAD.; c. 05/2013 Cath: LAD mild ISR, LCX nl, RCA 40p, patent stent.  . Cataract    has had lasik surg  . COPD (chronic obstructive pulmonary disease) ([place])   . Diabetes mellitus type 2, controlled, without complications ([place])   . Diverticulosis   . DJD (degenerative joint disease)   . Esophagitis 1991   grade 1  . GERD (gastroesophageal reflux disease)    Hiatal hernia  . Hemorrhoids   . Hyperlipidemia   . Hypertensive heart disease   . Iron deficiency anemia   . Migraines   . Paroxysmal atrial fibrillation (HCC)    a. s/p afib ablation 10/22/08 (J. [facility]).  . PVC's (premature ventricular contractions)   . Sleep apnea    Questionalble: RDI  during the total sleep time 6h 28 mins was 3.55/hr during REM sleep was at 5.71/hr. Supine AHI was 5.93/hr.   Past Surgical History:  Procedure Laterality Date  . CARDIAC CATHETERIZATION  05/2010   The proximal LAD was then predilated with 2.0 x 12 trek. this was then stented with a 2.5 x 16 promus Element drug-eluting stent at 14 atmosphere and prostdilated with 2.75 x 12 [place] trek at 16 atmospheres(2.8 mm) resulting the reduction of 80% proximal LAD stenosis to 0% residual with excellent flow.  Marland Kitchen CARDIAC CATHETERIZATION  06/05/2010   Successful percutaneous coronary intervention to the right coronary artery with percutaneous transluminal coronary angioplasty/stenting and insertion 3.0 x 16 mm Promus DES post dilated to 3.35 mm with stenosis being reduced to 0%  . CARDIAC CATHETERIZATION  02/2008   Post dilatation was performed using a 2.75 x 9 [place] sprinter, 10 atmospheres for 40 seconds and then 9 atmosphere for 35 sec. This resulted in the 80% area of narrowing pre-intervention, now appearing to normal. There was no edvidence of the dissection or thrombus and there was TIMI III flow pre and post.  . CARDIAC CATHETERIZATION  02/2006  . CORONARY ANGIOPLASTY WITH STENT PLACEMENT     3 stents/ 4 caths  . EYE SURGERY    . KNEE ARTHROSCOPY  right  . LASIK    . LEFT HEART CATHETERIZATION WITH CORONARY ANGIOGRAM N/A 06/29/2013   Procedure: LEFT HEART CATHETERIZATION WITH CORONARY ANGIOGRAM;  Surgeon: Leonie Man, MD;  Location: [facility] C[facility] LAB;  Service: Cardiovascular;  Laterality: N/A;  . NASAL SINUS SURGERY  2001  . NECK SURGERY     Cervical fusion  . RADIOFREQUENCY ABLATION  May 2010   atrial fibrillation  . ROTATOR CUFF REPAIR     left  . SHOULDER OPEN ROTATOR CUFF REPAIR     right  . SPINE SURGERY    . WRIST ARTHROCENTESIS     left   Current Outpatient Prescriptions on File Prior to Visit  Medication Sig Dispense Refill  . aspirin 81 MG tablet Take 81 mg by mouth daily.    .  carvedilol (COREG) 12.5 MG tablet Take 2 tablets (25 mg total) by mouth 2 (two) times daily with a meal. 360 tablet 3  . cholecalciferol (VITAMIN D) 1000 UNITS tablet Take 1,000 Units by mouth daily.    . citalopram (CELEXA) 40 MG tablet TAKE 1 TABLET(40 MG) BY MOUTH DAILY 30 tablet 11  . clopidogrel (PLAVIX) 75 MG tablet TAKE 1 TABLET BY MOUTH  EVERY DAY 90 tablet 3  . Coenzyme Q10 (CO Q 10) 100 MG CAPS Take 1 capsule by mouth daily.    . diclofenac sodium (VOLTAREN) 1 % GEL Apply 2 g topically 4 (four) times daily. 100 g 1  . diltiazem (CARDIZEM CD) 120 MG 24 hr capsule Take 1 capsule (120 mg total) by mouth at bedtime. 90 capsule 3  . fluticasone (FLONASE) 50 MCG/ACT nasal spray INSTILL 2 SPRAYS IN THE NOSE AT BEDTIME 48 g 3  . furosemide (LASIX) 40 MG tablet TAKE 1 TABLET BY MOUTH  DAILY 90 tablet 3  . nitroGLYCERIN (NITROSTAT) 0.4 MG SL tablet PLACE ONE TABLET UNDER THE TONGUE EVERY 5 MINUTES AS NEEDED FOR CHEST PAIN 25 tablet 3  . oxyCODONE-acetaminophen (PERCOCET) 10-325 MG tablet Take 1 tablet by mouth every 8 (eight) hours as needed for pain. 21 tablet 0  . pantoprazole (PROTONIX) 40 MG tablet TAKE 1 TABLET BY MOUTH  EVERY DAY 90 tablet 3  . potassium chloride (MICRO-K) 10 MEQ CR capsule TAKE 1 CAPSULE BY MOUTH  EVERY DAY 90 capsule 2  . rosuvastatin (CRESTOR) 40 MG tablet TAKE 1 TABLET BY MOUTH  DAILY 90 tablet 1  . telmisartan (MICARDIS) 40 MG tablet Take 0.5 tablets by mouth 2 (two) times daily. PT TAKES 20 MG     . UNABLE TO FIND CPAP therapy    . zolpidem (AMBIEN) 10 MG tablet Take 1 tablet (10 mg total) by mouth at bedtime as needed for sleep. 30 tablet 1   No current facility-administered medications on file prior to visit.    Allergies  Allergen Reactions  . Ibuprofen Swelling  . Other Shortness Of Breath    BETA BLOCKERS  . Topamax [Topiramate] Other (See Comments)    Pt states it made his tongue feel "scalded" and he couldn't eat   . Toprol Xl [Metoprolol Succinate]  Other (See Comments)    Personality change   Social History   Social History  . Marital status: Married    Spouse name: N/A  . Number of children: 2  . Years of education: N/A   Occupational History  . retired    Social History Main Topics  . Smoking status: Former Smoker    Quit date: 06/01/1987  . Smokeless tobacco:  Never Used  . Alcohol use 0.0 oz/week     Comment: once every 6-7 months  . Drug use: No  . Sexual activity: Not on file   Other Topics Concern  . Not on file   Social History Narrative  . No narrative on file      Review of Systems  All other systems reviewed and are negative.      Objective:   Physical Exam  Constitutional: He is oriented to person, place, and time.  Cardiovascular: Normal rate, regular rhythm and normal heart sounds.   Pulmonary/Chest: Effort normal and breath sounds normal. No respiratory distress. He has no wheezes. He has no rales. He exhibits no tenderness.  Musculoskeletal:       Lumbar back: He exhibits decreased range of motion, tenderness and pain.       Back:  Neurological: He is alert and oriented to person, place, and time. No cranial nerve deficit. He exhibits normal muscle tone. Coordination normal.  Vitals reviewed.         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 Therefore I believe this is likely left-sided lumbar radiculopathy. Begin prednisone taper pack and proceed with imaging of the back if no better in 1 week. I believe he is expressing status migrainosus. Hopefully the prednisone taper pack will help with this as an anti-inflammatory. I believe his chronic cough is likely secondary to laryngo-esophageal reflux. I've asked the patient to discontinue pantoprazole and to begin dexilant 60 mg by mouth daily. I will also use tramadol 50  mg by mouth every 8 hours when necessary coughing in case this is secondary to nerve irritation. Reassess in 2 weeks and if the cough persists in 2 weeks, proceed with imaging of the chest.

## 2017-01-13 ENCOUNTER — Ambulatory Visit (INDEPENDENT_AMBULATORY_CARE_PROVIDER_SITE_OTHER): Payer: 59 | Admitting: Family Medicine

## 2017-01-13 ENCOUNTER — Encounter: Payer: Self-pay | Admitting: Family Medicine

## 2017-01-13 VITALS — BP 130/62 | HR 68 | Temp 98.1°F | Resp 16 | Ht 68.0 in | Wt 277.0 lb

## 2017-01-13 DIAGNOSIS — R053 Chronic cough: Secondary | ICD-10-CM

## 2017-01-13 DIAGNOSIS — G43701 Chronic migraine without aura, not intractable, with status migrainosus: Secondary | ICD-10-CM

## 2017-01-13 DIAGNOSIS — R05 Cough: Secondary | ICD-10-CM

## 2017-01-13 DIAGNOSIS — M5432 Sciatica, left side: Secondary | ICD-10-CM | POA: Diagnosis not present

## 2017-01-13 NOTE — Progress Notes (Signed)
Subjective:    Patient ID: Lee [name], male    DOB: 02-16-1951, 66 y.o.   MRN: [id number]  HPI  12/30/16 The patient has been having severe pain shoots from his lower back into his posterior left hip and down to the superior left thigh for more than 2 weeks. He has pain with range of motion in his lower back. He has pain with standing. He denies any numbness or tingling in his feet. He denies any bowel or bladder incontinence. He is also been suffering with a chronic headache for more than a week. The headache is located on the crown of his head. It is pulsatile. There is photophobia and nausea associated with it. He has a history of migraines and states that he gets a migraine 1-2 times a week however this particular headache has persisted for more than a week. He is tried hydrocodone with no benefit. He also reports a chronic cough for several months. He describes it as a tickle in his throat. He denies any productive cough. He denies any chest pain shortness of breath dyspnea on exertion. He denies any fevers or chills. He denies any hemoptysis. He denies any pleurisy.  At that time, my plan was: I believe the patient is having left-sided sciatica secondary to a bulging disc in his lumbar spine. Therefore I believe this is likely left-sided lumbar radiculopathy. Begin prednisone taper pack and proceed with imaging of the back if no better in 1 week. I believe he is expressing status migrainosus. Hopefully the prednisone taper pack will help with this as an anti-inflammatory. I believe his chronic cough is likely secondary to laryngo-esophageal reflux. I've asked the patient to discontinue pantoprazole and to begin dexilant 60 mg by mouth daily. I will also use tramadol 50 mg by mouth every 8 hours when necessary coughing in case this is secondary to nerve irritation. Reassess in 2 weeks and if the cough persists in 2 weeks, proceed with imaging of the chest.  01/13/17 Patient is here today for  recheck. He was unable to tolerate dexilant.  However he started taking his protonix 40 mg twice a day and his cough subsided.  He denies any further cough. His back pain is much better after taking the prednisone. He occasionally has some pain in his lower back after prolonged periods of sitting. He also reports stiffness in his lower back but the pain is much better than last time. His headache has also stopped. He really benefited from the tramadol when he had his headache. He would like to use this medication sparingly in the future for headache if necessary Past Medical History:  Diagnosis Date  . Allergic rhinitis    chronic sinusitis  . Arthritis   . CAD (coronary artery disease)    a. 2009 PCI/DES to mLAD; b. 05/2010 s/p PCI RCA and LAD.; c. 05/2013 Cath: LAD mild ISR, LCX nl, RCA 40p, patent stent.  . Cataract    has had lasik surg  . COPD (chronic obstructive pulmonary disease) ([place])   . Diabetes mellitus type 2, controlled, without complications ([place])   . Diverticulosis   . DJD (degenerative joint disease)   . Esophagitis 1991   grade 1  . GERD (gastroesophageal reflux disease)    Hiatal hernia  . Hemorrhoids   . Hyperlipidemia   . Hypertensive heart disease   . Iron deficiency anemia   . Migraines   . Paroxysmal atrial fibrillation (HCC)    a. s/p afib ablation  10/22/08 ([facility]).  . PVC's (premature ventricular contractions)   . Sleep apnea    Questionalble: RDI during the total sleep time 6h 28 mins was 3.55/hr during REM sleep was at 5.71/hr. Supine AHI was 5.93/hr.   Past Surgical History:  Procedure Laterality Date  . CARDIAC CATHETERIZATION  05/2010   The proximal LAD was then predilated with 2.0 x 12 trek. this was then stented with a 2.5 x 16 promus Element drug-eluting stent at 14 atmosphere and prostdilated with 2.75 x 12 [place] trek at 16 atmospheres(2.8 mm) resulting the reduction of 80% proximal LAD stenosis to 0% residual with excellent flow.  Marland Kitchen CARDIAC  CATHETERIZATION  06/05/2010   Successful percutaneous coronary intervention to the right coronary artery with percutaneous transluminal coronary angioplasty/stenting and insertion 3.0 x 16 mm Promus DES post dilated to 3.35 mm with stenosis being reduced to 0%  . CARDIAC CATHETERIZATION  02/2008   Post dilatation was performed using a 2.75 x 9 [place] sprinter, 10 atmospheres for 40 seconds and then 9 atmosphere for 35 sec. This resulted in the 80% area of narrowing pre-intervention, now appearing to normal. There was no edvidence of the dissection or thrombus and there was TIMI III flow pre and post.  . CARDIAC CATHETERIZATION  02/2006  . CORONARY ANGIOPLASTY WITH STENT PLACEMENT     3 stents/ 4 caths  . EYE SURGERY    . KNEE ARTHROSCOPY     right  . LASIK    . LEFT HEART CATHETERIZATION WITH CORONARY ANGIOGRAM N/A 06/29/2013   Procedure: LEFT HEART CATHETERIZATION WITH CORONARY ANGIOGRAM;  Surgeon: Leonie Man, MD;  Location: [facility] CATH LAB;  Service: Cardiovascular;  Laterality: N/A;  . NASAL SINUS SURGERY  2001  . NECK SURGERY     Cervical fusion  . RADIOFREQUENCY ABLATION  May 2010   atrial fibrillation  . ROTATOR CUFF REPAIR     left  . SHOULDER OPEN ROTATOR CUFF REPAIR     right  . SPINE SURGERY    . WRIST ARTHROCENTESIS     left   Current Outpatient Prescriptions on File Prior to Visit  Medication Sig Dispense Refill  . aspirin 81 MG tablet Take 81 mg by mouth daily.    . carvedilol (COREG) 12.5 MG tablet Take 2 tablets (25 mg total) by mouth 2 (two) times daily with a meal. 360 tablet 3  . cholecalciferol (VITAMIN D) 1000 UNITS tablet Take 1,000 Units by mouth daily.    . citalopram (CELEXA) 40 MG tablet TAKE 1 TABLET(40 MG) BY MOUTH DAILY 30 tablet 11  . clopidogrel (PLAVIX) 75 MG tablet TAKE 1 TABLET BY MOUTH  EVERY DAY 90 tablet 3  . Coenzyme Q10 (CO Q 10) 100 MG CAPS Take 1 capsule by mouth daily.    . diclofenac sodium (VOLTAREN) 1 % GEL Apply 2 g topically 4 (four) times  daily. 100 g 1  . diltiazem (CARDIZEM CD) 120 MG 24 hr capsule Take 1 capsule (120 mg total) by mouth at bedtime. 90 capsule 3  . fluticasone (FLONASE) 50 MCG/ACT nasal spray INSTILL 2 SPRAYS IN THE NOSE AT BEDTIME 48 g 3  . furosemide (LASIX) 40 MG tablet TAKE 1 TABLET BY MOUTH  DAILY 90 tablet 3  . nitroGLYCERIN (NITROSTAT) 0.4 MG SL tablet PLACE ONE TABLET UNDER THE TONGUE EVERY 5 MINUTES AS NEEDED FOR CHEST PAIN 25 tablet 3  . oxyCODONE-acetaminophen (PERCOCET) 10-325 MG tablet Take 1 tablet by mouth every 8 (eight) hours as needed  for pain. 21 tablet 0  . pantoprazole (PROTONIX) 40 MG tablet TAKE 1 TABLET BY MOUTH  EVERY DAY 90 tablet 3  . potassium chloride (MICRO-K) 10 MEQ CR capsule TAKE 1 CAPSULE BY MOUTH  EVERY DAY 90 capsule 2  . rosuvastatin (CRESTOR) 40 MG tablet TAKE 1 TABLET BY MOUTH  DAILY 90 tablet 1  . telmisartan (MICARDIS) 40 MG tablet Take 0.5 tablets by mouth 2 (two) times daily. PT TAKES 20 MG     . traMADol (ULTRAM) 50 MG tablet Take 1 tablet (50 mg total) by mouth every 8 (eight) hours as needed. 30 tablet 0  . UNABLE TO FIND CPAP therapy    . zolpidem (AMBIEN) 10 MG tablet Take 1 tablet (10 mg total) by mouth at bedtime as needed for sleep. 30 tablet 1   No current facility-administered medications on file prior to visit.    Allergies  Allergen Reactions  . Ibuprofen Swelling  . Other Shortness Of Breath    BETA BLOCKERS  . Topamax [Topiramate] Other (See Comments)    Pt states it made his tongue feel "scalded" and he couldn't eat   . Toprol Xl [Metoprolol Succinate] Other (See Comments)    Personality change   Social History   Social History  . Marital status: Married    Spouse name: N/A  . Number of children: 2  . Years of education: N/A   Occupational History  . retired    Social History Main Topics  . Smoking status: Former Smoker    Quit date: 06/01/1987  . Smokeless tobacco: Never Used  . Alcohol use 0.0 oz/week     Comment: once every 6-7  months  . Drug use: No  . Sexual activity: Not on file   Other Topics Concern  . Not on file   Social History Narrative  . No narrative on file      Review of Systems  Musculoskeletal: Positive for back pain.  Neurological: Positive for headaches.  All other systems reviewed and are negative.      Objective:   Physical Exam  Constitutional: He is oriented to person, place, and time.  Cardiovascular: Normal rate, regular rhythm and normal heart sounds.   Pulmonary/Chest: Effort normal and breath sounds normal. No respiratory distress. He has no wheezes. He has no rales. He exhibits no tenderness.  Musculoskeletal:       Lumbar back: He exhibits decreased range of motion and pain. He exhibits no tenderness.  Neurological: He is alert and oriented to person, place, and time. No cranial nerve deficit. He exhibits normal muscle tone. Coordination normal.  Nursing note and vitals reviewed.         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 No further workup is necessary. I recommended he continue to take his proton pump inhibitor twice daily for the next 2 months and then reduce the dose back to once a day. Back pain is much improved. I recommended stretching and yoga to help prevent this in the future. Headache has subsided. I would be willing to give him tramadol again in the future if necessary for headache. However I cautioned him about using this with oxycodone. He states that he only uses the oxycodone occasionally for severe pain and he would not combine the 2 medications

## 2017-02-16 ENCOUNTER — Other Ambulatory Visit: Payer: Self-pay

## 2017-02-16 NOTE — Telephone Encounter (Signed)
Rx request for Tramadol  Last OV 01/13/2017 Next OV: none Last refilled 12/30/2016 Please advise

## 2017-02-17 MED ORDER — TRAMADOL HCL 50 MG PO TABS: 50.0000 mg | ORAL_TABLET | Freq: Three times a day (TID) | ORAL | 0 refills | Status: DC | PRN

## 2017-02-17 NOTE — Telephone Encounter (Signed)
ok 

## 2017-02-17 NOTE — Telephone Encounter (Signed)
RX called in .

## 2017-03-07 ENCOUNTER — Other Ambulatory Visit: Payer: Self-pay | Admitting: Family Medicine

## 2017-03-07 ENCOUNTER — Other Ambulatory Visit: Payer: Self-pay | Admitting: Cardiovascular Disease

## 2017-03-07 NOTE — Telephone Encounter (Signed)
Medication refilled per protocol. 

## 2017-03-14 ENCOUNTER — Other Ambulatory Visit: Payer: Self-pay | Admitting: Family Medicine

## 2017-03-14 MED ORDER — OXYCODONE-ACETAMINOPHEN 10-325 MG PO TABS: 1.0000 | ORAL_TABLET | Freq: Three times a day (TID) | ORAL | 0 refills | Status: DC | PRN

## 2017-03-14 NOTE — Telephone Encounter (Signed)
ok 

## 2017-03-14 NOTE — Telephone Encounter (Signed)
rx can be picked up on 10/16. Patient aware

## 2017-03-14 NOTE — Telephone Encounter (Signed)
Last OV 8/16 Last refill 6/15 Ok to refill?

## 2017-03-14 NOTE — Telephone Encounter (Signed)
Patient is calling to get rx for his oxycodone  [phone]

## 2017-03-17 ENCOUNTER — Ambulatory Visit: Payer: 59 | Admitting: Podiatry

## 2017-03-22 ENCOUNTER — Ambulatory Visit: Payer: 59 | Admitting: Podiatry

## 2017-04-05 ENCOUNTER — Other Ambulatory Visit: Payer: Self-pay | Admitting: Family Medicine

## 2017-04-19 ENCOUNTER — Telehealth: Payer: Self-pay | Admitting: *Deleted

## 2017-04-19 NOTE — Telephone Encounter (Signed)
Rx for CPAP signed and returned to [facility].

## 2017-05-03 ENCOUNTER — Encounter: Payer: Self-pay | Admitting: Family Medicine

## 2017-05-03 ENCOUNTER — Ambulatory Visit: Payer: 59 | Admitting: Family Medicine

## 2017-05-03 VITALS — BP 142/80 | HR 68 | Temp 98.0°F | Resp 16 | Ht 68.0 in | Wt 272.0 lb

## 2017-05-03 DIAGNOSIS — J209 Acute bronchitis, unspecified: Secondary | ICD-10-CM

## 2017-05-03 DIAGNOSIS — R51 Headache: Secondary | ICD-10-CM

## 2017-05-03 DIAGNOSIS — R519 Headache, unspecified: Secondary | ICD-10-CM

## 2017-05-03 MED ORDER — AZITHROMYCIN 250 MG PO TABS: ORAL_TABLET | ORAL | 0 refills | Status: DC

## 2017-05-03 MED ORDER — PREDNISONE 20 MG PO TABS: ORAL_TABLET | ORAL | 0 refills | Status: DC

## 2017-05-03 NOTE — Progress Notes (Signed)
Subjective:    Patient ID: Lee [name], male    DOB: December 10, 1950, 66 y.o.   MRN: [id number]  Back Pain  Associated symptoms include headaches.  Headache   Associated symptoms include back pain.    Patient has a history of chronic daily headache.  I have seen him several times in the past for this including in 2016 and 2017.  He is also seen 2 separate neurologist for chronic daily headache in the remote past.  He has been tried on Topamax on several occasions but stops it due to stomach toxicity.  He has been tried on Botox injections.  He is already on a beta-blocker carvedilol.  He has never been tried on Depakote.  He states that over the last 2 months, he has developed a chronic daily headache.  It can be located anywhere in his head.  It can be in his occiput.  It can be on the top of his scalp.  It can be in both temples.  It does not stay in one place.  There is light sensitivity and sound sensitivity.  He also develops nausea associated with it.  He denies any vision changes or other neurologic deficits.  He denies any recent head injury.  He is scheduled to see a neurologist but cannot be seen until April and was hoping for something sooner than that that may help.  He also reports a week history of cough with chest congestion wheezing and shortness of breath.  On exam, he has audible expiratory wheezes with rhonchorous breath sounds.  He reports a cough productive of yellow sputum.  He denies any fevers.  He has some mild shortness of breath due to wheezing Past Medical History:  Diagnosis Date  . Allergic rhinitis    chronic sinusitis  . Arthritis   . CAD (coronary artery disease)    a. 2009 PCI/DES to mLAD; b. 05/2010 s/p PCI RCA and LAD.; c. 05/2013 Cath: LAD mild ISR, LCX nl, RCA 40p, patent stent.  . Cataract    has had lasik surg  . COPD (chronic obstructive pulmonary disease) ([place])   . Diabetes mellitus type 2, controlled, without complications ([place])   . Diverticulosis   .  DJD (degenerative joint disease)   . Esophagitis 1991   grade 1  . GERD (gastroesophageal reflux disease)    Hiatal hernia  . Hemorrhoids   . Hyperlipidemia   . Hypertensive heart disease   . Iron deficiency anemia   . Migraines   . Paroxysmal atrial fibrillation (H[facility])    a. s/p afib ablation 10/22/08 (J. Allred).  . PVC's (premature ventricular contractions)   . Sleep apnea    Questionalble: RDI during the total sleep time 6h 28 mins was 3.55/hr during REM sleep was at 5.71/hr. Supine AHI was 5.93/hr.   Past Surgical History:  Procedure Laterality Date  . CARDIAC CATHETERIZATION  05/2010   The proximal LAD was then predilated with 2.0 x 12 trek. this was then stented with a 2.5 x 16 promus Element drug-eluting stent at 14 atmosphere and prostdilated with 2.75 x 12 [place] trek at 16 atmospheres(2.8 mm) resulting the reduction of 80% proximal LAD stenosis to 0% residual with excellent flow.  Marland Kitchen CARDIAC CATHETERIZATION  06/05/2010   Successful percutaneous coronary intervention to the right coronary artery with percutaneous transluminal coronary angioplasty/stenting and insertion 3.0 x 16 mm Promus DES post dilated to 3.35 mm with stenosis being reduced to 0%  . CARDIAC CATHETERIZATION  02/2008  Post dilatation was performed using a 2.75 x 9 [place] sprinter, 10 atmospheres for 40 seconds and then 9 atmosphere for 35 sec. This resulted in the 80% area of narrowing pre-intervention, now appearing to normal. There was no edvidence of the dissection or thrombus and there was TIMI III flow pre and post.  . CARDIAC CATHETERIZATION  02/2006  . CORONARY ANGIOPLASTY WITH STENT PLACEMENT     3 stents/ 4 caths  . EYE SURGERY    . KNEE ARTHROSCOPY     right  . LASIK    . LEFT HEART CATHETERIZATION WITH CORONARY ANGIOGRAM N/A 06/29/2013   Procedure: LEFT HEART CATHETERIZATION WITH CORONARY ANGIOGRAM;  Surgeon: Leonie Man, MD;  Location: [facility] CATH LAB;  Service: Cardiovascular;  Laterality: N/A;  . NASAL  SINUS SURGERY  2001  . NECK SURGERY     Cervical fusion  . RADIOFREQUENCY ABLATION  May 2010   atrial fibrillation  . ROTATOR CUFF REPAIR     left  . SHOULDER OPEN ROTATOR CUFF REPAIR     right  . SPINE SURGERY    . WRIST ARTHROCENTESIS     left   Current Outpatient Medications on File Prior to Visit  Medication Sig Dispense Refill  . aspirin 81 MG tablet Take 81 mg by mouth daily.    . carvedilol (COREG) 12.5 MG tablet TAKE 2 TABLETS BY MOUTH 2  TIMES DAILY WITH A MEAL. 360 tablet 1  . cholecalciferol (VITAMIN D) 1000 UNITS tablet Take 1,000 Units by mouth daily.    . citalopram (CELEXA) 40 MG tablet TAKE 1 TABLET(40 MG) BY MOUTH DAILY 30 tablet 11  . clopidogrel (PLAVIX) 75 MG tablet TAKE 1 TABLET BY MOUTH  EVERY DAY 90 tablet 3  . Coenzyme Q10 (CO Q 10) 100 MG CAPS Take 1 capsule by mouth daily.    . diclofenac sodium (VOLTAREN) 1 % GEL Apply 2 g topically 4 (four) times daily. 100 g 1  . fluticasone (FLONASE) 50 MCG/ACT nasal spray INSTILL 2 SPRAYS IN THE  NOSE AT BEDTIME 48 g 3  . furosemide (LASIX) 40 MG tablet TAKE 1 TABLET BY MOUTH  DAILY 90 tablet 3  . nitroGLYCERIN (NITROSTAT) 0.4 MG SL tablet PLACE ONE TABLET UNDER THE TONGUE EVERY 5 MINUTES AS NEEDED FOR CHEST PAIN 25 tablet 3  . oxyCODONE-acetaminophen (PERCOCET) 10-325 MG tablet Take 1 tablet by mouth every 8 (eight) hours as needed for pain. 21 tablet 0  . pantoprazole (PROTONIX) 40 MG tablet TAKE 1 TABLET BY MOUTH  EVERY DAY 90 tablet 3  . potassium chloride (MICRO-K) 10 MEQ CR capsule TAKE 1 CAPSULE BY MOUTH  EVERY DAY 90 capsule 2  . rosuvastatin (CRESTOR) 40 MG tablet TAKE 1 TABLET BY MOUTH  DAILY 90 tablet 1  . telmisartan (MICARDIS) 40 MG tablet TAKE 1 TABLET BY MOUTH  DAILY 90 tablet 0  . traMADol (ULTRAM) 50 MG tablet Take 1 tablet (50 mg total) by mouth every 8 (eight) hours as needed. 30 tablet 0  . UNABLE TO FIND CPAP therapy    . zolpidem (AMBIEN) 10 MG tablet Take 1 tablet (10 mg total) by mouth at bedtime  as needed for sleep. 30 tablet 1  . diltiazem (CARDIZEM CD) 120 MG 24 hr capsule Take 1 capsule (120 mg total) by mouth at bedtime. 90 capsule 3   No current facility-administered medications on file prior to visit.    Allergies  Allergen Reactions  . Ibuprofen Swelling  . Other Shortness  Of Breath    BETA BLOCKERS  . Topamax [Topiramate] Other (See Comments)    Pt states it made his tongue feel "scalded" and he couldn't eat   . Toprol Xl [Metoprolol Succinate] Other (See Comments)    Personality change   Social History   Socioeconomic History  . Marital status: Married    Spouse name: Not on file  . Number of children: 2  . Years of education: Not on file  . Highest education level: Not on file  Social Needs  . Financial resource strain: Not on file  . Food insecurity - worry: Not on file  . Food insecurity - inability: Not on file  . Transportation needs - medical: Not on file  . Transportation needs - non-medical: Not on file  Occupational History  . Occupation: retired  Tobacco Use  . Smoking status: Former Smoker    Last attempt to quit: 06/01/1987    Years since quitting: 29.9  . Smokeless tobacco: Never Used  Substance and Sexual Activity  . Alcohol use: Yes    Alcohol/week: 0.0 oz    Comment: once every 6-7 months  . Drug use: No  . Sexual activity: Not on file  Other Topics Concern  . Not on file  Social History Narrative  . Not on file      Review of Systems  Musculoskeletal: Positive for back pain.  Neurological: Positive for headaches.  All other systems reviewed and are negative.      Objective:   Physical Exam  Constitutional: He is oriented to person, place, and time. He appears well-developed and well-nourished. No distress.  HENT:  Right Ear: External ear normal.  Left Ear: External ear normal.  Nose: Nose normal.  Mouth/Throat: Oropharynx is clear and moist. No oropharyngeal exudate.  Eyes: Conjunctivae and EOM are normal. Pupils are  equal, round, and reactive to light.  Cardiovascular: Normal rate, regular rhythm and normal heart sounds.  Pulmonary/Chest: Effort normal. No respiratory distress. He has wheezes. He has no rales. He exhibits no tenderness.  Abdominal: Soft. Bowel sounds are normal. He exhibits no distension. There is no tenderness. There is no rebound and no guarding.  Neurological: He is alert and oriented to person, place, and time. He has normal reflexes. He displays normal reflexes. No cranial nerve deficit. He exhibits normal muscle tone. Coordination normal.  Skin: He is not diaphoretic.  Nursing note and vitals reviewed.         Assessment & Plan:  Acute bronchitis, unspecified organism - Plan: predniSONE (DELTASONE) 20 MG tablet, azithromycin (ZITHROMAX) 250 MG tablet  Chronic daily headache  I believe the patient has bronchitis with reactive airway disease.  I will treat the patient with a combination of prednisone taper pack for the reactive airway disease coupled with a Z-Pak for the bronchitis.  I have recommended decreasing his dose of citalopram to 20 mg a day while taking the Zithromax to avoid QTC prolongation.  I would like the patient to call us back later this week once I am sure that he is tolerating these medications and feeling better.  If he is at that time, I would then start the patient on Depakote to try to prevent chronic daily headache/migraine.  If the patient fails Depakote, he may be a good candidate for Aimovig.

## 2017-05-12 ENCOUNTER — Telehealth: Payer: Self-pay | Admitting: Family Medicine

## 2017-05-12 NOTE — Telephone Encounter (Signed)
Patient requesting a refill on Oxycodone - Ok to refill??        

## 2017-05-12 NOTE — Telephone Encounter (Signed)
Ok, does he want to try depakote for headaches (see last ov).

## 2017-05-13 MED ORDER — OXYCODONE-ACETAMINOPHEN 10-325 MG PO TABS: 1.0000 | ORAL_TABLET | Freq: Three times a day (TID) | ORAL | 0 refills | Status: DC | PRN

## 2017-05-13 NOTE — Telephone Encounter (Signed)
RX printed, left up front and patient aware to pick up after and pt would like to start Depakote please give sig and I will send to pharm.

## 2017-05-16 MED ORDER — DIVALPROEX SODIUM ER 500 MG PO TB24: 500.0000 mg | ORAL_TABLET | Freq: Every day | ORAL | 5 refills | Status: DC

## 2017-05-16 NOTE — Telephone Encounter (Signed)
Medication called/sent to requested pharmacy  

## 2017-05-16 NOTE — Telephone Encounter (Signed)
depakote ER 500 mg poqhs.  Recheck in 2 weeks.

## 2017-05-29 ENCOUNTER — Other Ambulatory Visit: Payer: Self-pay | Admitting: Cardiovascular Disease

## 2017-06-13 ENCOUNTER — Ambulatory Visit: Payer: 59 | Admitting: Family Medicine

## 2017-06-16 ENCOUNTER — Encounter: Payer: Self-pay | Admitting: Family Medicine

## 2017-06-16 ENCOUNTER — Ambulatory Visit: Payer: 59 | Admitting: Family Medicine

## 2017-06-16 VITALS — BP 136/74 | HR 82 | Temp 98.1°F | Resp 18 | Ht 68.0 in | Wt 272.0 lb

## 2017-06-16 DIAGNOSIS — G43701 Chronic migraine without aura, not intractable, with status migrainosus: Secondary | ICD-10-CM

## 2017-06-16 DIAGNOSIS — G8929 Other chronic pain: Secondary | ICD-10-CM | POA: Diagnosis not present

## 2017-06-16 DIAGNOSIS — M25561 Pain in right knee: Secondary | ICD-10-CM | POA: Diagnosis not present

## 2017-06-16 MED ORDER — ERENUMAB-AOOE 70 MG/ML ~~LOC~~ SOAJ: 70.0000 mg | SUBCUTANEOUS | 3 refills | Status: DC

## 2017-06-16 NOTE — Progress Notes (Signed)
Subjective:    Patient ID: Lee [name], male    DOB: 07/07/1950, 67 y.o.   MRN: [id number]  HPI  Lease see last office visit.  Patient tried Depakote for 3 weeks.  Saw no benefit.  Discontinue the medication because of palpitations.  He is previously tried and failed Topamax.  Topamax caused numbness and burning in his mouth and an upset stomach.  He is also tried Botox injections with no benefit.  He is scheduled to see the neurologist in March however he continues to complain of 1-2 severe migraines per week.  Migraines 10 the last 2 days when they occur and are debilitating.  He also complains of right knee pain.  Has a history of osteoarthritis in the right knee and is received benefit from occasional cortisone injections.  He is requesting a repeat cortisone injection in the knee today.  He denies any recent falls or injuries Past Medical History:  Diagnosis Date  . Allergic rhinitis    chronic sinusitis  . Arthritis   . CAD (coronary artery disease)    a. 2009 PCI/DES to mLAD; b. 05/2010 s/p PCI RCA and LAD.; c. 05/2013 Cath: LAD mild ISR, LCX nl, RCA 40p, patent stent.  . Cataract    has had lasik surg  . COPD (chronic obstructive pulmonary disease) ([place])   . Diabetes mellitus type 2, controlled, without complications ([place])   . Diverticulosis   . DJD (degenerative joint disease)   . Esophagitis 1991   grade 1  . GERD (gastroesophageal reflux disease)    Hiatal hernia  . Hemorrhoids   . Hyperlipidemia   . Hypertensive heart disease   . Iron deficiency anemia   . Migraines   . Paroxysmal atrial fibrillation (H[facility])    a. s/p afib ablation 10/22/08 (J. Allred).  . PVC's (premature ventricular contractions)   . Sleep apnea    Questionalble: RDI during the total sleep time 6h 28 mins was 3.55/hr during REM sleep was at 5.71/hr. Supine AHI was 5.93/hr.   Past Surgical History:  Procedure Laterality Date  . CARDIAC CATHETERIZATION  05/2010   The proximal LAD was then  predilated with 2.0 x 12 trek. this was then stented with a 2.5 x 16 promus Element drug-eluting stent at 14 atmosphere and prostdilated with 2.75 x 12 [place] trek at 16 atmospheres(2.8 mm) resulting the reduction of 80% proximal LAD stenosis to 0% residual with excellent flow.  Marland Kitchen CARDIAC CATHETERIZATION  06/05/2010   Successful percutaneous coronary intervention to the right coronary artery with percutaneous transluminal coronary angioplasty/stenting and insertion 3.0 x 16 mm Promus DES post dilated to 3.35 mm with stenosis being reduced to 0%  . CARDIAC CATHETERIZATION  02/2008   Post dilatation was performed using a 2.75 x 9 [place] sprinter, 10 atmospheres for 40 seconds and then 9 atmosphere for 35 sec. This resulted in the 80% area of narrowing pre-intervention, now appearing to normal. There was no edvidence of the dissection or thrombus and there was TIMI III flow pre and post.  . CARDIAC CATHETERIZATION  02/2006  . CORONARY ANGIOPLASTY WITH STENT PLACEMENT     3 stents/ 4 caths  . EYE SURGERY    . KNEE ARTHROSCOPY     right  . LASIK    . LEFT HEART CATHETERIZATION WITH CORONARY ANGIOGRAM N/A 06/29/2013   Procedure: LEFT HEART CATHETERIZATION WITH CORONARY ANGIOGRAM;  Surgeon: Leonie Man, MD;  Location: [facility] CATH LAB;  Service: Cardiovascular;  Laterality: N/A;  .  NASAL SINUS SURGERY  2001  . NECK SURGERY     Cervical fusion  . RADIOFREQUENCY ABLATION  May 2010   atrial fibrillation  . ROTATOR CUFF REPAIR     left  . SHOULDER OPEN ROTATOR CUFF REPAIR     right  . SPINE SURGERY    . WRIST ARTHROCENTESIS     left   Current Outpatient Medications on File Prior to Visit  Medication Sig Dispense Refill  . aspirin 81 MG tablet Take 81 mg by mouth daily.    Marland Kitchen azithromycin (ZITHROMAX) 250 MG tablet 2 tabs poqday1, 1 tab poqday 2-5 6 tablet 0  . carvedilol (COREG) 12.5 MG tablet TAKE 2 TABLETS BY MOUTH 2  TIMES DAILY WITH A MEAL. 360 tablet 1  . cholecalciferol (VITAMIN D) 1000 UNITS tablet  Take 1,000 Units by mouth daily.    . citalopram (CELEXA) 40 MG tablet TAKE 1 TABLET(40 MG) BY MOUTH DAILY 30 tablet 11  . clopidogrel (PLAVIX) 75 MG tablet TAKE 1 TABLET BY MOUTH  EVERY DAY 90 tablet 3  . Coenzyme Q10 (CO Q 10) 100 MG CAPS Take 1 capsule by mouth daily.    . diclofenac sodium (VOLTAREN) 1 % GEL Apply 2 g topically 4 (four) times daily. 100 g 1  . divalproex (DEPAKOTE ER) 500 MG 24 hr tablet Take 1 tablet (500 mg total) by mouth at bedtime. 30 tablet 5  . fluticasone (FLONASE) 50 MCG/ACT nasal spray INSTILL 2 SPRAYS IN THE  NOSE AT BEDTIME 48 g 3  . furosemide (LASIX) 40 MG tablet TAKE 1 TABLET BY MOUTH  DAILY 90 tablet 3  . nitroGLYCERIN (NITROSTAT) 0.4 MG SL tablet PLACE ONE TABLET UNDER THE TONGUE EVERY 5 MINUTES AS NEEDED FOR CHEST PAIN 25 tablet 3  . oxyCODONE-acetaminophen (PERCOCET) 10-325 MG tablet Take 1 tablet by mouth every 8 (eight) hours as needed for pain. 21 tablet 0  . pantoprazole (PROTONIX) 40 MG tablet TAKE 1 TABLET BY MOUTH  EVERY DAY 90 tablet 3  . potassium chloride (MICRO-K) 10 MEQ CR capsule TAKE 1 CAPSULE BY MOUTH  EVERY DAY 90 capsule 3  . predniSONE (DELTASONE) 20 MG tablet 3 tabs poqday 1-2, 2 tabs poqday 3-4, 1 tab poqday 5-6 12 tablet 0  . rosuvastatin (CRESTOR) 40 MG tablet TAKE 1 TABLET BY MOUTH  DAILY 90 tablet 1  . telmisartan (MICARDIS) 40 MG tablet TAKE 1 TABLET BY MOUTH  DAILY 90 tablet 3  . traMADol (ULTRAM) 50 MG tablet Take 1 tablet (50 mg total) by mouth every 8 (eight) hours as needed. 30 tablet 0  . UNABLE TO FIND CPAP therapy    . zolpidem (AMBIEN) 10 MG tablet Take 1 tablet (10 mg total) by mouth at bedtime as needed for sleep. 30 tablet 1  . diltiazem (CARDIZEM CD) 120 MG 24 hr capsule Take 1 capsule (120 mg total) by mouth at bedtime. 90 capsule 3   No current facility-administered medications on file prior to visit.    Allergies  Allergen Reactions  . Ibuprofen Swelling  . Other Shortness Of Breath    BETA BLOCKERS  . Topamax  [Topiramate] Other (See Comments)    Pt states it made his tongue feel "scalded" and he couldn't eat   . Toprol Xl [Metoprolol Succinate] Other (See Comments)    Personality change   Social History   Socioeconomic History  . Marital status: Married    Spouse name: Not on file  . Number of children: 2  .  Years of education: Not on file  . Highest education level: Not on file  Social Needs  . Financial resource strain: Not on file  . Food insecurity - worry: Not on file  . Food insecurity - inability: Not on file  . Transportation needs - medical: Not on file  . Transportation needs - non-medical: Not on file  Occupational History  . Occupation: retired  Tobacco Use  . Smoking status: Former Smoker    Last attempt to quit: 06/01/1987    Years since quitting: 30.0  . Smokeless tobacco: Never Used  Substance and Sexual Activity  . Alcohol use: Yes    Alcohol/week: 0.0 oz    Comment: once every 6-7 months  . Drug use: No  . Sexual activity: Not on file  Other Topics Concern  . Not on file  Social History Narrative  . Not on file     Review of Systems  All other systems reviewed and are negative.      Objective:   Physical Exam  Constitutional: He is oriented to person, place, and time.  Cardiovascular: Normal rate and regular rhythm.  Pulmonary/Chest: Effort normal and breath sounds normal.  Musculoskeletal:       Right knee: He exhibits decreased range of motion. He exhibits no effusion. Tenderness found. Medial joint line and lateral joint line tenderness noted.  Neurological: He is alert and oriented to person, place, and time. He has normal reflexes. No cranial nerve deficit. Coordination normal.  Vitals reviewed.         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  Patient  tolerated the procedure well without complication.  I recommended trying Aimovig 70 mg once a month.  Patient will check on the price.  Hopefully this will help prevent and control his migraine headaches.

## 2017-07-14 ENCOUNTER — Ambulatory Visit: Payer: 59 | Admitting: Podiatry

## 2017-07-14 DIAGNOSIS — Q828 Other specified congenital malformations of skin: Secondary | ICD-10-CM

## 2017-07-16 NOTE — Progress Notes (Signed)
He presents today chief complaint of calluses to the plantar aspect of the fifth metatarsals bilaterally.  He has not been here since June and his feet have been hurting him.  States that he is getting ready to go on a cruise 1 March and wants his feet feel better.  Objective: Vital signs are stable alert and oriented x3.  Pulses are palpable.  Neurologic sensorium is intact.  Deep tendon reflexes are intact.  Muscle strength is normal symmetrical bilateral.  Orthopedic evaluation demonstrates all joints distal to the ankle full range of motion without crepitation.  Porokeratotic lesion sub-fifth metatarsal bilateral no open lesions or wounds are noted with these.  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  Plan: Debrided benign hyperkeratotic lesion plantar aspect fifth metatarsal bilateral.  Did not place padding or chemical destruction today at patient's request.

## 2017-07-22 ENCOUNTER — Other Ambulatory Visit: Payer: Self-pay | Admitting: Family Medicine

## 2017-08-17 ENCOUNTER — Other Ambulatory Visit: Payer: Self-pay | Admitting: Family Medicine

## 2017-08-17 NOTE — Telephone Encounter (Signed)
Patient requesting a refill on Oxycodone   & Ambien  LOV:  06/16/17  LRF:   05/23/17

## 2017-08-18 MED ORDER — OXYCODONE-ACETAMINOPHEN 10-325 MG PO TABS: 1.0000 | ORAL_TABLET | Freq: Three times a day (TID) | ORAL | 0 refills | Status: DC | PRN

## 2017-08-18 MED ORDER — ZOLPIDEM TARTRATE 10 MG PO TABS: 10.0000 mg | ORAL_TABLET | Freq: Every evening | ORAL | 3 refills | Status: DC | PRN

## 2017-09-26 ENCOUNTER — Other Ambulatory Visit: Payer: 59

## 2017-09-26 DIAGNOSIS — E785 Hyperlipidemia, unspecified: Secondary | ICD-10-CM

## 2017-09-26 DIAGNOSIS — Z0000 Encounter for general adult medical examination without abnormal findings: Secondary | ICD-10-CM

## 2017-09-26 DIAGNOSIS — Z125 Encounter for screening for malignant neoplasm of prostate: Secondary | ICD-10-CM

## 2017-09-26 DIAGNOSIS — R7303 Prediabetes: Secondary | ICD-10-CM

## 2017-09-26 DIAGNOSIS — I10 Essential (primary) hypertension: Secondary | ICD-10-CM

## 2017-09-26 LAB — CBC WITH DIFFERENTIAL/PLATELET
BASOS ABS: 48 {cells}/uL (ref 0–200)
Basophils Relative: 0.8 %
EOS PCT: 2.2 %
Eosinophils Absolute: 132 cells/uL (ref 15–500)
HEMATOCRIT: 39.6 % (ref 38.5–50.0)
HEMOGLOBIN: 13.4 g/dL (ref 13.2–17.1)
LYMPHS ABS: 1728 {cells}/uL (ref 850–3900)
MCH: 29.4 pg (ref 27.0–33.0)
MCHC: 33.8 g/dL (ref 32.0–36.0)
MCV: 86.8 fL (ref 80.0–100.0)
MPV: 9.2 fL (ref 7.5–12.5)
Monocytes Relative: 10.1 %
NEUTROS ABS: 3486 {cells}/uL (ref 1500–7800)
Neutrophils Relative %: 58.1 %
Platelets: 222 10*3/uL (ref 140–400)
RBC: 4.56 10*6/uL (ref 4.20–5.80)
RDW: 11.9 % (ref 11.0–15.0)
Total Lymphocyte: 28.8 %
WBC mixed population: 606 cells/uL (ref 200–950)
WBC: 6 10*3/uL (ref 3.8–10.8)

## 2017-09-26 LAB — COMPREHENSIVE METABOLIC PANEL
AG RATIO: 2 (calc) (ref 1.0–2.5)
ALBUMIN MSPROF: 4.2 g/dL (ref 3.6–5.1)
ALT: 19 U/L (ref 9–46)
AST: 18 U/L (ref 10–35)
Alkaline phosphatase (APISO): 57 U/L (ref 40–115)
BUN: 9 mg/dL (ref 7–25)
CO2: 28 mmol/L (ref 20–32)
Calcium: 9.3 mg/dL (ref 8.6–10.3)
Chloride: 100 mmol/L (ref 98–110)
Creat: 0.79 mg/dL (ref 0.70–1.25)
Globulin: 2.1 g/dL (calc) (ref 1.9–3.7)
Glucose, Bld: 129 mg/dL — ABNORMAL HIGH (ref 65–99)
POTASSIUM: 4.9 mmol/L (ref 3.5–5.3)
SODIUM: 134 mmol/L — AB (ref 135–146)
TOTAL PROTEIN: 6.3 g/dL (ref 6.1–8.1)
Total Bilirubin: 0.5 mg/dL (ref 0.2–1.2)

## 2017-09-26 LAB — LIPID PANEL
Cholesterol: 134 mg/dL (ref <200)
HDL: 51 mg/dL (ref >40)
LDL Cholesterol (Calc): 69 mg/dL (calc)
Non-HDL Cholesterol (Calc): 83 mg/dL (calc) (ref <130)
Total CHOL/HDL Ratio: 2.6 (calc) (ref <5.0)
Triglycerides: 49 mg/dL (ref <150)

## 2017-09-27 LAB — HEMOGLOBIN A1C
EAG (MMOL/L): 8.1 (calc)
HEMOGLOBIN A1C: 6.7 %{Hb} — AB (ref <5.7)
MEAN PLASMA GLUCOSE: 146 (calc)

## 2017-09-27 LAB — PSA: PSA: 1 ng/mL (ref <=4.0)

## 2017-09-29 ENCOUNTER — Encounter: Payer: Self-pay | Admitting: Family Medicine

## 2017-09-29 ENCOUNTER — Ambulatory Visit (INDEPENDENT_AMBULATORY_CARE_PROVIDER_SITE_OTHER): Payer: 59 | Admitting: Family Medicine

## 2017-09-29 VITALS — BP 118/70 | HR 64 | Temp 98.1°F | Resp 16 | Ht 68.0 in | Wt 274.0 lb

## 2017-09-29 DIAGNOSIS — I4891 Unspecified atrial fibrillation: Secondary | ICD-10-CM

## 2017-09-29 DIAGNOSIS — G4733 Obstructive sleep apnea (adult) (pediatric): Secondary | ICD-10-CM

## 2017-09-29 DIAGNOSIS — L989 Disorder of the skin and subcutaneous tissue, unspecified: Secondary | ICD-10-CM | POA: Diagnosis not present

## 2017-09-29 DIAGNOSIS — Z9861 Coronary angioplasty status: Secondary | ICD-10-CM

## 2017-09-29 DIAGNOSIS — I10 Essential (primary) hypertension: Secondary | ICD-10-CM | POA: Diagnosis not present

## 2017-09-29 DIAGNOSIS — E118 Type 2 diabetes mellitus with unspecified complications: Secondary | ICD-10-CM | POA: Diagnosis not present

## 2017-09-29 DIAGNOSIS — Z0000 Encounter for general adult medical examination without abnormal findings: Secondary | ICD-10-CM | POA: Diagnosis not present

## 2017-09-29 DIAGNOSIS — E785 Hyperlipidemia, unspecified: Secondary | ICD-10-CM | POA: Diagnosis not present

## 2017-09-29 DIAGNOSIS — I2510 Atherosclerotic heart disease of native coronary artery without angina pectoris: Secondary | ICD-10-CM

## 2017-09-29 NOTE — Progress Notes (Signed)
Subjective:    Patient ID: Lee [name], male    DOB: September 11, 1950, 67 y.o.   MRN: [id number]  HPI  Patient is here today for complete physical exam.  His hemoglobin A1c has risen from 6.4-6.7.  Patient is now in my estimation a diabetic.  He is already seeing an ophthalmologist annually.  He has an appointment scheduled for later this summer.  I have asked the patient to have the ophthalmologist send [place] his records when he sees them.  A diabetic foot exam was performed today and is normal.  Immunizations are listed below and are up-to-date except for Shingrix: Immunization History  Administered Date(s) Administered  . Influenza Split 03/04/2013, 02/28/2014, 03/01/2015, 02/26/2016  . Influenza, Seasonal, Injecte, Preservative Fre 03/04/2013, 02/28/2014  . Influenza-Unspecified 03/01/2015, 02/26/2016  . Pneumococcal Conjugate-13 05/29/2015  . Pneumococcal Polysaccharide-23 06/14/2004, 09/17/2016  . Pneumococcal-Unspecified 06/14/2004, 09/17/2016  . Tdap 03/30/2012  . Zoster 03/30/2012   I have recommended that he receive this vaccine if reasonably priced.  His colonoscopy was in 2014 and is not due again until 2024.  His PSA has risen from 0.7-1.0 suggesting normal age-related growth of the prostate.  The remainder of his lab work as listed below: Lab on 09/26/2017  Component Date Value Ref Range Status  . WBC 09/26/2017 6.0  3.8 - 10.8 Thousand/uL Final  . RBC 09/26/2017 4.56  4.20 - 5.80 Million/uL Final  . Hemoglobin 09/26/2017 13.4  13.2 - 17.1 g/dL Final  . HCT 09/26/2017 39.6  38.5 - 50.0 % Final  . MCV 09/26/2017 86.8  80.0 - 100.0 fL Final  . MCH 09/26/2017 29.4  27.0 - 33.0 pg Final  . MCHC 09/26/2017 33.8  32.0 - 36.0 g/dL Final  . RDW 09/26/2017 11.9  11.0 - 15.0 % Final  . Platelets 09/26/2017 222  140 - 400 Thousand/uL Final  . MPV 09/26/2017 9.2  7.5 - 12.5 fL Final  . Neutro Abs 09/26/2017 3,486  1,500 - 7,800 cells/uL Final  . Lymphs Abs 09/26/2017 1,728  850 -  3,900 cells/uL Final  . WBC mixed population 09/26/2017 606  200 - 950 cells/uL Final  . Eosinophils Absolute 09/26/2017 132  15 - 500 cells/uL Final  . Basophils Absolute 09/26/2017 48  0 - 200 cells/uL Final  . Neutrophils Relative % 09/26/2017 58.1  % Final  . Total Lymphocyte 09/26/2017 28.8  % Final  . Monocytes Relative 09/26/2017 10.1  % Final  . Eosinophils Relative 09/26/2017 2.2  % Final  . Basophils Relative 09/26/2017 0.8  % Final  . Glucose, Bld 09/26/2017 129* 65 - 99 mg/dL Final   Comment: .            Fasting reference interval . For someone without known diabetes, a glucose value >125 mg/dL indicates that they may have diabetes and this should be confirmed with a follow-up test. .   . BUN 09/26/2017 9  7 - 25 mg/dL Final  . Creat 09/26/2017 0.79  0.70 - 1.25 mg/dL Final   Comment: For patients >30 years of age, the reference limit for Creatinine is approximately 13% higher for people identified as African-American. .   Havery Moros Ratio 86/76/1951 NOT APPLICABLE  6 - 22 (calc) Final  . Sodium 09/26/2017 134* 135 - 146 mmol/L Final  . Potassium 09/26/2017 4.9  3.5 - 5.3 mmol/L Final  . Chloride 09/26/2017 100  98 - 110 mmol/L Final  . CO2 09/26/2017 28  20 - 32 mmol/L Final  .  Calcium 09/26/2017 9.3  8.6 - 10.3 mg/dL Final  . Total Protein 09/26/2017 6.3  6.1 - 8.1 g/dL Final  . Albumin 09/26/2017 4.2  3.6 - 5.1 g/dL Final  . Globulin 09/26/2017 2.1  1.9 - 3.7 g/dL (calc) Final  . AG Ratio 09/26/2017 2.0  1.0 - 2.5 (calc) Final  . Total Bilirubin 09/26/2017 0.5  0.2 - 1.2 mg/dL Final  . Alkaline phosphatase (APISO) 09/26/2017 57  40 - 115 U/L Final  . AST 09/26/2017 18  10 - 35 U/L Final  . ALT 09/26/2017 19  9 - 46 U/L Final  . Cholesterol 09/26/2017 134  <200 mg/dL Final  . HDL 09/26/2017 51  >40 mg/dL Final  . Triglycerides 09/26/2017 49  <150 mg/dL Final  . LDL Cholesterol (Calc) 09/26/2017 69  mg/dL (calc) Final   Comment: Reference range:  <100 . Desirable range <100 mg/dL for primary prevention;   <70 mg/dL for patients with CHD or diabetic patients  with > or = 2 CHD risk factors. Marland Kitchen LDL-C is now calculated using the Martin-Hopkins  calculation, which is a validated novel method providing  better accuracy than the Friedewald equation in the  estimation of LDL-C.  Cresenciano Genre et al. Annamaria Helling. 4315;400(86): 2061-2068  (http://education.[facility].com/faq/FAQ164)   . Total CHOL/HDL Ratio 09/26/2017 2.6  <5.0 (calc) Final  . Non-HDL Cholesterol (Calc) 09/26/2017 83  <130 mg/dL (calc) Final   Comment: For patients with diabetes plus 1 major ASCVD risk  factor, treating to a non-HDL-C goal of <100 mg/dL  (LDL-C of <70 mg/dL) is considered a therapeutic  option.   Marland Kitchen PSA 09/26/2017 1.0  < OR = 4.0 ng/mL Final   Comment: The total PSA value from this assay system is  standardized against the WHO standard. The test  result will be approximately 20% lower when compared  to the equimolar-standardized total PSA (Beckman  Coulter). Comparison of serial PSA results should be  interpreted with this fact in mind. . This test was performed using the Siemens  chemiluminescent method. Values obtained from  different assay methods cannot be used interchangeably. PSA levels, regardless of value, should not be interpreted as absolute evidence of the presence or absence of disease.   . Hgb A1c MFr Bld 09/26/2017 6.7* <5.7 % of total Hgb Final   Comment: For someone without known diabetes, a hemoglobin A1c value of 6.5% or greater indicates that they may have  diabetes and this should be confirmed with a follow-up  test. . For someone with known diabetes, a value <7% indicates  that their diabetes is well controlled and a value  greater than or equal to 7% indicates suboptimal  control. A1c targets should be individualized based on  duration of diabetes, age, comorbid conditions, and  other considerations. . Currently, no  consensus exists regarding use of hemoglobin A1c for diagnosis of diabetes for children. .   . Mean Plasma Glucose 09/26/2017 146  (calc) Final  . eAG (mmol/L) 09/26/2017 8.1  (calc) Final   Unfortunately while performing his exam, there is a nodular lesion on his left mandible within his beard that is concerning for a basal cell carcinoma.  It has a poorly circumscribed erythematous base.  On top of that is a nodular dime shaped lesion with telangiectasias and pearly iridescent.  Patient states that it bleeds easily.  It is approximately 4 to 5 mm in diameter.  I have recommended dermatology consultation for excision of this. Past Medical History:  Diagnosis Date  .  Allergic rhinitis    chronic sinusitis  . Arthritis   . CAD (coronary artery disease)    a. 2009 PCI/DES to mLAD; b. 05/2010 s/p PCI RCA and LAD.; c. 05/2013 Cath: LAD mild ISR, LCX nl, RCA 40p, patent stent.  . Cataract    has had lasik surg  . COPD (chronic obstructive pulmonary disease) ([place])   . Diabetes mellitus type 2, controlled, without complications ([place])   . Diverticulosis   . DJD (degenerative joint disease)   . Esophagitis 1991   grade 1  . GERD (gastroesophageal reflux disease)    Hiatal hernia  . Hemorrhoids   . Hyperlipidemia   . Hypertensive heart disease   . Iron deficiency anemia   . Migraines   . Paroxysmal atrial fibrillation (HCC)    a. s/p afib ablation 10/22/08 ([facility]).  . PVC's (premature ventricular contractions)   . Sleep apnea    Questionalble: RDI during the total sleep time 6h 28 mins was 3.55/hr during REM sleep was at 5.71/hr. Supine AHI was 5.93/hr.   Past Surgical History:  Procedure Laterality Date  . CARDIAC CATHETERIZATION  05/2010   The proximal LAD was then predilated with 2.0 x 12 trek. this was then stented with a 2.5 x 16 promus Element drug-eluting stent at 14 atmosphere and prostdilated with 2.75 x 12 [place] trek at 16 atmospheres(2.8 mm) resulting the reduction of 80%  proximal LAD stenosis to 0% residual with excellent flow.  Marland Kitchen CARDIAC CATHETERIZATION  06/05/2010   Successful percutaneous coronary intervention to the right coronary artery with percutaneous transluminal coronary angioplasty/stenting and insertion 3.0 x 16 mm Promus DES post dilated to 3.35 mm with stenosis being reduced to 0%  . CARDIAC CATHETERIZATION  02/2008   Post dilatation was performed using a 2.75 x 9 [place] sprinter, 10 atmospheres for 40 seconds and then 9 atmosphere for 35 sec. This resulted in the 80% area of narrowing pre-intervention, now appearing to normal. There was no edvidence of the dissection or thrombus and there was TIMI III flow pre and post.  . CARDIAC CATHETERIZATION  02/2006  . CORONARY ANGIOPLASTY WITH STENT PLACEMENT     3 stents/ 4 caths  . EYE SURGERY    . KNEE ARTHROSCOPY     right  . LASIK    . LEFT HEART CATHETERIZATION WITH CORONARY ANGIOGRAM N/A 06/29/2013   Procedure: LEFT HEART CATHETERIZATION WITH CORONARY ANGIOGRAM;  Surgeon: Leonie Man, MD;  Location: [facility] CATH LAB;  Service: Cardiovascular;  Laterality: N/A;  . NASAL SINUS SURGERY  2001  . NECK SURGERY     Cervical fusion  . RADIOFREQUENCY ABLATION  May 2010   atrial fibrillation  . ROTATOR CUFF REPAIR     left  . SHOULDER OPEN ROTATOR CUFF REPAIR     right  . SPINE SURGERY    . WRIST ARTHROCENTESIS     left   Current Outpatient Medications on File Prior to Visit  Medication Sig Dispense Refill  . aspirin 81 MG tablet Take 81 mg by mouth daily.    . baclofen (LIORESAL) 10 MG tablet   2  . carvedilol (COREG) 12.5 MG tablet TAKE 2 TABLETS BY MOUTH 2  TIMES DAILY WITH A MEAL 360 tablet 3  . cholecalciferol (VITAMIN D) 1000 UNITS tablet Take 1,000 Units by mouth daily.    . citalopram (CELEXA) 40 MG tablet TAKE 1 TABLET(40 MG) BY MOUTH DAILY 90 tablet 0  . clopidogrel (PLAVIX) 75 MG tablet TAKE 1 TABLET  BY MOUTH  EVERY DAY 90 tablet 3  . Coenzyme Q10 (CO Q 10) 100 MG CAPS Take 1 capsule by  mouth daily.    . diclofenac sodium (VOLTAREN) 1 % GEL Apply 2 g topically 4 (four) times daily. 100 g 1  . fexofenadine (ALLEGRA) 180 MG tablet Take by mouth.    . fluticasone (FLONASE) 50 MCG/ACT nasal spray INSTILL 2 SPRAYS IN THE  NOSE AT BEDTIME 48 g 3  . furosemide (LASIX) 40 MG tablet TAKE 1 TABLET BY MOUTH  DAILY 90 tablet 3  . Galcanezumab-gnlm (EMGALITY) 120 MG/ML SOAJ Inject into the skin every 30 (thirty) days.     . nitroGLYCERIN (NITROSTAT) 0.4 MG SL tablet PLACE ONE TABLET UNDER THE TONGUE EVERY 5 MINUTES AS NEEDED FOR CHEST PAIN 25 tablet 3  . oxyCODONE-acetaminophen (PERCOCET) 10-325 MG tablet Take 1 tablet by mouth every 8 (eight) hours as needed for pain. 21 tablet 0  . pantoprazole (PROTONIX) 40 MG tablet TAKE 1 TABLET BY MOUTH  EVERY DAY 90 tablet 3  . potassium chloride (MICRO-K) 10 MEQ CR capsule TAKE 1 CAPSULE BY MOUTH  EVERY DAY 90 capsule 3  . rosuvastatin (CRESTOR) 40 MG tablet TAKE 1 TABLET BY MOUTH  DAILY 90 tablet 1  . telmisartan (MICARDIS) 40 MG tablet TAKE 1 TABLET BY MOUTH  DAILY 90 tablet 3  . traMADol (ULTRAM) 50 MG tablet Take 1 tablet (50 mg total) by mouth every 8 (eight) hours as needed. 30 tablet 0  . UNABLE TO FIND CPAP therapy    . zolpidem (AMBIEN) 10 MG tablet Take 1 tablet (10 mg total) by mouth at bedtime as needed for sleep. 30 tablet 3  . diltiazem (CARDIZEM CD) 120 MG 24 hr capsule Take 1 capsule (120 mg total) by mouth at bedtime. 90 capsule 3   No current facility-administered medications on file prior to visit.    Allergies  Allergen Reactions  . Ibuprofen Swelling  . Other Shortness Of Breath    BETA BLOCKERS  . Topamax [Topiramate] Other (See Comments)    Pt states it made his tongue feel "scalded" and he couldn't eat   . Toprol Xl [Metoprolol Succinate] Other (See Comments)    Personality change   Social History   Socioeconomic History  . Marital status: Married    Spouse name: Not on file  . Number of children: 2  . Years of  education: Not on file  . Highest education level: Not on file  Occupational History  . Occupation: retired  Scientific laboratory technician  . Financial resource strain: Not on file  . Food insecurity:    Worry: Not on file    Inability: Not on file  . Transportation needs:    Medical: Not on file    Non-medical: Not on file  Tobacco Use  . Smoking status: Former Smoker    Last attempt to quit: 06/01/1987    Years since quitting: 30.3  . Smokeless tobacco: Never Used  Substance and Sexual Activity  . Alcohol use: Yes    Alcohol/week: 0.0 oz    Comment: once every 6-7 months  . Drug use: No  . Sexual activity: Not on file  Lifestyle  . Physical activity:    Days per week: Not on file    Minutes per session: Not on file  . Stress: Not on file  Relationships  . Social connections:    Talks on phone: Not on file    Gets together: Not on file  Attends religious service: Not on file    Active member of club or organization: Not on file    Attends meetings of clubs or organizations: Not on file    Relationship status: Not on file  . Intimate partner violence:    Fear of current or ex partner: Not on file    Emotionally abused: Not on file    Physically abused: Not on file    Forced sexual activity: Not on file  Other Topics Concern  . Not on file  Social History Narrative  . Not on file   Family History  Problem Relation Age of Onset  . Heart disease Father        and sister  . Diabetes Father   . Colon cancer Mother        mets from uterine  . Uterine cancer Mother   . Heart disease Sister      Review of Systems  All other systems reviewed and are negative.      Objective:   Physical Exam  Constitutional: He is oriented to person, place, and time. He appears well-developed and well-nourished. No distress.  HENT:  Head: Normocephalic and atraumatic.  Right Ear: External ear normal.  Left Ear: External ear normal.  Nose: Nose normal.  Mouth/Throat: Oropharynx is clear and  moist. No oropharyngeal exudate.  Eyes: Pupils are equal, round, and reactive to light. Conjunctivae and EOM are normal. Right eye exhibits no discharge. Left eye exhibits no discharge. No scleral icterus.  Neck: Normal range of motion. Neck supple. No JVD present. No tracheal deviation present. No thyromegaly present.  Cardiovascular: Normal rate, regular rhythm and intact distal pulses. Exam reveals no gallop and no friction rub.  Murmur heard. Pulmonary/Chest: Effort normal and breath sounds normal. No stridor. No respiratory distress. He has no wheezes. He has no rales. He exhibits no tenderness.  Abdominal: Soft. Bowel sounds are normal. He exhibits no distension and no mass. There is no tenderness. There is no rebound and no guarding. No hernia.  Musculoskeletal: Normal range of motion. He exhibits no edema or tenderness.  Lymphadenopathy:    He has no cervical adenopathy.  Neurological: He is alert and oriented to person, place, and time. He displays normal reflexes. No cranial nerve deficit or sensory deficit. He exhibits normal muscle tone. Coordination normal.  Skin: Skin is warm. Capillary refill takes less than 2 seconds. No rash noted. He is not diaphoretic. No erythema. No pallor.  Psychiatric: He has a normal mood and affect. His behavior is normal. Judgment and thought content normal.  Vitals reviewed.         Assessment & Plan:  General medical exam  CAD S/P percutaneous coronary angioplasty  Essential hypertension  Type 2 diabetes mellitus with complication, without long-term current use of insulin (HCC) - Plan: Microalbumin, urine  Atrial fibrillation, unspecified type ([place])  Hyperlipidemia LDL goal <70  Morbid obesity (HCC)  OSA (obstructive sleep apnea)  Lesion of skin of face - Plan: Ambulatory referral to Dermatology  Patient declines hepatitis C screening.  Diabetic foot exam was performed today and is normal.  I have asked the patient to have his  ophthalmologist send [place] his records when he sees them later this summer.  Immunizations are up-to-date although I did recommend the second shingles vaccine if reasonably priced.  His blood pressure is excellent.  Patient continues to remain on dual antiplatelet therapy.  His last stent was placed in 2012.  Patient has an appointment to  see his cardiologist later this summer.  I have asked him to discuss with his cardiologist whether he needs to remain on dual antiplatelet therapy indefinitely as it appears he had a drug-eluting stent.  Regarding his diabetes, I have recommended 15 to 20 pounds of weight loss, extensive dietary changes, and regular aerobic exercise.  I will check a urine microalbumin and reassess the patient in October or November.  If worsening, I would start metformin.  He has a history of atrial fibrillation however he has been normal sinus rhythm ever since his ablation.  He remains in normal sinus rhythm now and does not require anticoagulation from that standpoint.  His LDL cholesterol is well below 70.  I am concerned about the lesion on his left mandible.  I have placed a dermatology consultation for an excisional biopsy for what appears to be a basal cell carcinoma.

## 2017-09-30 LAB — MICROALBUMIN, URINE: MICROALB UR: 0.6 mg/dL

## 2017-10-09 ENCOUNTER — Other Ambulatory Visit: Payer: Self-pay | Admitting: Family Medicine

## 2017-10-10 ENCOUNTER — Telehealth: Payer: Self-pay | Admitting: Family Medicine

## 2017-10-10 NOTE — Telephone Encounter (Signed)
Patient called and asked if he could get a refill on his Tramadol. Please advise

## 2017-10-11 MED ORDER — TRAMADOL HCL 50 MG PO TABS
50.0000 mg | ORAL_TABLET | Freq: Three times a day (TID) | ORAL | 0 refills | Status: DC | PRN
Start: 2017-10-11 — End: 2017-12-05

## 2017-10-11 NOTE — Telephone Encounter (Signed)
I sent to [facility]

## 2017-10-12 NOTE — Telephone Encounter (Signed)
Spoke with patient and informed him per Dr.Pickard. Tramadol was sent into the pharmacy.Patient verbalized understanding.

## 2017-10-12 NOTE — Telephone Encounter (Signed)
Left message return call

## 2017-10-25 ENCOUNTER — Ambulatory Visit: Payer: 59 | Admitting: Podiatry

## 2017-10-25 ENCOUNTER — Encounter: Payer: Self-pay | Admitting: Podiatry

## 2017-10-25 DIAGNOSIS — Q828 Other specified congenital malformations of skin: Secondary | ICD-10-CM

## 2017-10-25 NOTE — Progress Notes (Signed)
He presents today chief complaint of painful lesion sub-fifth metatarsal phalangeal joints bilaterally.  He denies trauma.  Denies fever chills nausea vomiting muscle aches and pains.  Objective: Vital signs are stable alert and oriented x3.  Pulses are palpable.  No open lesions are visible.  He has reactive porokeratotic lesions sub-fifth metatarsal heads bilaterally.  Was debrided and enucleated alleviated his symptoms immediately.  Assessment: Porokeratosis sub-fifth metatarsal head bilateral.  Plan: Mechanical debridement and then application of salicylic acid under occlusion to be left on for 3 days without getting wet.  He will then wash this off thoroughly he will follow-up with me when these lesions become painful once again.

## 2017-11-02 ENCOUNTER — Other Ambulatory Visit: Payer: Self-pay | Admitting: Family Medicine

## 2017-11-02 NOTE — Telephone Encounter (Signed)
Patient is requesting a refill on Oxycodone   LOV: 09/29/17  LRF:   08/18/17

## 2017-11-02 NOTE — Telephone Encounter (Signed)
Patient is requesting a refill of oxycodone be sent to  [facility]  [phone]

## 2017-11-03 MED ORDER — OXYCODONE-ACETAMINOPHEN 10-325 MG PO TABS: 1.0000 | ORAL_TABLET | Freq: Three times a day (TID) | ORAL | 0 refills | Status: DC | PRN

## 2017-12-05 ENCOUNTER — Other Ambulatory Visit: Payer: Self-pay | Admitting: Family Medicine

## 2017-12-06 NOTE — Telephone Encounter (Signed)
Requesting refill    Tramadol  LOV: 09/29/17  LRF:  10/11/17

## 2017-12-19 ENCOUNTER — Encounter: Payer: Self-pay | Admitting: Cardiovascular Disease

## 2017-12-19 ENCOUNTER — Ambulatory Visit: Payer: 59 | Admitting: Cardiovascular Disease

## 2017-12-19 VITALS — BP 138/82 | HR 66 | Ht 68.0 in | Wt 267.8 lb

## 2017-12-19 DIAGNOSIS — I2510 Atherosclerotic heart disease of native coronary artery without angina pectoris: Secondary | ICD-10-CM

## 2017-12-19 DIAGNOSIS — I10 Essential (primary) hypertension: Secondary | ICD-10-CM | POA: Diagnosis not present

## 2017-12-19 DIAGNOSIS — I4510 Unspecified right bundle-branch block: Secondary | ICD-10-CM | POA: Diagnosis not present

## 2017-12-19 DIAGNOSIS — I440 Atrioventricular block, first degree: Secondary | ICD-10-CM | POA: Diagnosis not present

## 2017-12-19 DIAGNOSIS — G4733 Obstructive sleep apnea (adult) (pediatric): Secondary | ICD-10-CM

## 2017-12-19 DIAGNOSIS — Z9861 Coronary angioplasty status: Secondary | ICD-10-CM

## 2017-12-19 DIAGNOSIS — R7309 Other abnormal glucose: Secondary | ICD-10-CM

## 2017-12-19 NOTE — Progress Notes (Signed)
Patient ID: Lee [name], male   DOB: 05/07/1951, 67 y.o.   MRN: [id number]     Primary M.D.: Dr. Jenna Luo  HPI: Lee [name], is a 67 y.o. male who presents to the office today for a 13 month followup cardiology evaluation.  Mr. Vert has known CAD and in 2009 underwent stenting of his mid LAD. In 2012 he underwent staged intervention with stenting of his proximal RCA and at a new site in his proximal LAD. A nuclear perfusion study February 2014 showed normal perfusion and function. In January 2015, he was seen by [name] in the office with complaints of chest pressure. He underwent cardiac catheterization this chest pain which was done by Dr. [name] on 06/29/2013. Catheterization did not demonstrate significant restenosis of his LAD stents with mild 20% narrowing in the proximal stent and 40% mid stent. The circumflex was normal. His RCA had 40% narrowing just proximal to a widely patent stent in the mid vessel. Nitrate therapy was added to his medical regimen but due to significant nitrate mediated headaches this ultimately was discontinued.   In 2007, a sleep study which suggested upper airway resistance syndrome without overall sleep apnea with the exception of mild sleep apnea with REM sleep at that time CPAP was not recommended. In June 2014 he was having symptoms suggestive of progressive sleep apnea. He ultimately underwent a sleep study. I do not have the specifics of this diagnostic polysomnogram but subsequently he did undergo a CPAP titration to apparently his AHI was 12 on his initial study and he dropped his oxygen from 96% to 83%. He ultimately was titrated up to a CPAP pressure of 11 cm.  Additional problems also include obesity , as well as a history of palpitations.  I had titrated his beta blocker therapy which improved his palpitations.  He remains active.  He denies any episodes of chest pain.  He denies presyncope or syncope.  A follow-up myocardial  perfusion study on 09/23/2015 showed an ejection fraction at 62%.  There were no ECG changes.  He had normal perfusion without scar or ischemia.  I last saw him in June 2018 at which time was experiencing occasional palpitations.  We discussed reduction of caffeine and I added low-dose Cardizem CD to his regimen.    Over the past year, he has done well and has been without recurrent chest pain.  He can use to use CPAP with 100% compliance.  DME company is [facility].  He has noticed some very mild shortness of breath with significant activity.  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  He has not been very successful with weight loss.  He denies recent palpitations.  He presents for reevaluation.    Past Medical History:  Diagnosis Date  . Allergic rhinitis    chronic sinusitis  . Arthritis   . CAD (coronary artery disease)    a. 2009 PCI/DES to mLAD; b. 05/2010 s/p PCI RCA and LAD.; c. 05/2013 Cath: LAD mild ISR, LCX nl, RCA 40p, patent stent.  . Cataract    has had lasik surg  . COPD (chronic obstructive pulmonary disease) ([place])   . Diabetes mellitus type 2, controlled, without complications ([place])   . Diverticulosis   . DJD (degenerative joint disease)   . Esophagitis 1991   grade 1  . GERD (gastroesophageal reflux disease)    Hiatal hernia  . Hemorrhoids   . Hyperlipidemia   .  Hypertensive heart disease   . Iron deficiency anemia   . Migraines   . Paroxysmal atrial fibrillation ([facility])    a. s/p afib ablation 10/22/08 ([facility]).  . PVC's (premature ventricular contractions)   . Sleep apnea    Questionalble: RDI during the total sleep time 6h 28 mins was 3.55/hr during REM sleep was at 5.71/hr. Supine AHI was 5.93/hr.    Past Surgical History:  Procedure Laterality Date  . CARDIAC CATHETERIZATION  05/2010   The proximal LAD was then predilated with 2.0 x 12 trek. this was then stented with a 2.5 x 16 promus Element drug-eluting stent  at 14 atmosphere and prostdilated with 2.75 x 12 [place] trek at 16 atmospheres(2.8 mm) resulting the reduction of 80% proximal LAD stenosis to 0% residual with excellent flow.  Marland Kitchen CARDIAC CATHETERIZATION  06/05/2010   Successful percutaneous coronary intervention to the right coronary artery with percutaneous transluminal coronary angioplasty/stenting and insertion 3.0 x 16 mm Promus DES post dilated to 3.35 mm with stenosis being reduced to 0%  . CARDIAC CATHETERIZATION  02/2008   Post dilatation was performed using a 2.75 x 9 [place] sprinter, 10 atmospheres for 40 seconds and then 9 atmosphere for 35 sec. This resulted in the 80% area of narrowing pre-intervention, now appearing to normal. There was no edvidence of the dissection or thrombus and there was TIMI III flow pre and post.  . CARDIAC CATHETERIZATION  02/2006  . CORONARY ANGIOPLASTY WITH STENT PLACEMENT     3 stents/ 4 caths  . EYE SURGERY    . KNEE ARTHROSCOPY     right  . LASIK    . LEFT HEART CATHETERIZATION WITH CORONARY ANGIOGRAM N/A 06/29/2013   Procedure: LEFT HEART CATHETERIZATION WITH CORONARY ANGIOGRAM;  Surgeon: Leonie Man, MD;  [facility];  Service: Cardiovascular;  Laterality: N/A;  . NASAL SINUS SURGERY  2001  . NECK SURGERY     Cervical fusion  . RADIOFREQUENCY ABLATION  May 2010   atrial fibrillation  . ROTATOR CUFF REPAIR     left  . SHOULDER OPEN ROTATOR CUFF REPAIR     right  . SPINE SURGERY    . WRIST ARTHROCENTESIS     left    Allergies  Allergen Reactions  . Ibuprofen Swelling  . Other Shortness Of Breath    BETA BLOCKERS  . Topamax [Topiramate] Other (See Comments)    Pt states it made his tongue feel "scalded" and he couldn't eat   . Toprol Xl [Metoprolol Succinate] Other (See Comments)    Personality change  . Metoprolol-Hctz Er Other (See Comments)    Indigestion,mouth raw    Current Outpatient Medications  Medication Sig Dispense Refill  . aspirin 81 MG tablet Take 81 mg by mouth  daily.    . baclofen (LIORESAL) 10 MG tablet   2  . carvedilol (COREG) 12.5 MG tablet TAKE 2 TABLETS BY MOUTH 2  TIMES DAILY WITH A MEAL 360 tablet 3  . cholecalciferol (VITAMIN D) 1000 UNITS tablet Take 1,000 Units by mouth daily.    . citalopram (CELEXA) 40 MG tablet TAKE 1 TABLET(40 MG) BY MOUTH DAILY 90 tablet 3  . Coenzyme Q10 (CO Q 10) 100 MG CAPS Take 1 capsule by mouth daily.    . diclofenac sodium (VOLTAREN) 1 % GEL Apply 2 g topically 4 (four) times daily. 100 g 1  . fexofenadine (ALLEGRA) 180 MG tablet Take by mouth.    . fluticasone (FLONASE) 50 MCG/ACT  nasal spray INSTILL 2 SPRAYS IN THE  NOSE AT BEDTIME 48 g 3  . furosemide (LASIX) 40 MG tablet TAKE 1 TABLET BY MOUTH  DAILY 90 tablet 3  . Galcanezumab-gnlm (EMGALITY) 120 MG/ML SOAJ Inject into the skin every 30 (thirty) days.     . nitroGLYCERIN (NITROSTAT) 0.4 MG SL tablet PLACE ONE TABLET UNDER THE TONGUE EVERY 5 MINUTES AS NEEDED FOR CHEST PAIN 25 tablet 3  . oxyCODONE-acetaminophen (PERCOCET) 10-325 MG tablet Take 1 tablet by mouth every 8 (eight) hours as needed for pain. 21 tablet 0  . pantoprazole (PROTONIX) 40 MG tablet TAKE 1 TABLET BY MOUTH  EVERY DAY 90 tablet 3  . potassium chloride (MICRO-K) 10 MEQ CR capsule TAKE 1 CAPSULE BY MOUTH  EVERY DAY 90 capsule 3  . rosuvastatin (CRESTOR) 40 MG tablet TAKE 1 TABLET BY MOUTH  DAILY 90 tablet 1  . telmisartan (MICARDIS) 40 MG tablet TAKE 1 TABLET BY MOUTH  DAILY 90 tablet 3  . traMADol (ULTRAM) 50 MG tablet TAKE 1 TABLET(50 MG) BY MOUTH EVERY 8 HOURS AS NEEDED 30 tablet 0  . UNABLE TO FIND CPAP therapy    . zolpidem (AMBIEN) 10 MG tablet Take 1 tablet (10 mg total) by mouth at bedtime as needed for sleep. 30 tablet 3  . diltiazem (CARDIZEM CD) 120 MG 24 hr capsule Take 1 capsule (120 mg total) by mouth at bedtime. 90 capsule 3   No current facility-administered medications for this visit.     Socially he's married has 2 children he works in Development worker, international aid. There is a  remote tobacco history but he quit in 1989.  ROS General: Negative; No fevers, chills, or night sweats; positive for obesity HEENT: Negative; No changes in vision or hearing, sinus congestion, difficulty swallowing Pulmonary: Negative; No cough, wheezing, shortness of breath, hemoptysis Cardiovascular: See history of present illness;  GI: Negative; No nausea, vomiting, diarrhea, or abdominal pain GU: Positive for GERD; No dysuria, hematuria, or difficulty voiding Musculoskeletal: Negative; no myalgias, joint pain, or weakness Hematologic/Oncology: Negative; no easy bruising, bleeding Endocrine: Negative; no heat/cold intolerance; no diabetes Neuro: Positive for history of migraine headaches; no changes in balance,  Skin: Negative; No rashes or skin lesions Psychiatric: Negative; No behavioral problems, depression Sleep: Positive for obstructive sleep apnea,  on CPAP therapy; No snoring, daytime sleepiness, hypersomnolence, bruxism, restless legs, hypnogognic hallucinations, no cataplexy Other comprehensive 14 point system review is negative.  PE BP 138/82   Pulse 66   Ht '5\' 8"'  (1.727 m)   Wt 267 lb 12.8 oz (121.5 kg)   BMI 40.72 kg/m   Morbid obesity  Repeat blood pressure by me 130/80  Wt Readings from Last 3 Encounters:  12/19/17 267 lb 12.8 oz (121.5 kg)  09/29/17 274 lb (124.3 kg)  06/16/17 272 lb (123.4 kg)   General: Alert, oriented, no distress.  Skin: normal turgor, no rashes, warm and dry HEENT: Normocephalic, atraumatic. Pupils equal round and reactive to light; sclera anicteric; extraocular muscles intact;  Nose without nasal septal hypertrophy Mouth/Parynx benign; Mallinpatti scale 3 Neck: No JVD, no carotid bruits; normal carotid upstroke Lungs: clear to ausculatation and percussion; no wheezing or rales Chest wall: without tenderness to palpitation Heart: PMI not displaced, RRR, s1 s2 normal, 1/6 systolic murmur, no diastolic murmur, no rubs, gallops, thrills,  or heaves Abdomen: Diastases recti; soft, nontender; no hepatosplenomehaly, BS+; abdominal aorta nontender and not dilated by palpation. Back: no CVA tenderness Pulses 2+ Musculoskeletal: full range of motion,  normal strength, no joint deformities Extremities: no clubbing cyanosis or edema, Homan's sign negative  Neurologic: grossly nonfocal; Cranial nerves grossly wnl Psychologic: Normal mood and affect   ECG (independently read by me): Normal sinus rhythm at 66 bpm.  First-degree AV block.  Right bundle branch block with repolarization changes.  June 2018 ECG (independently read by me): Normal sinus rhythm at 65 bpm.  First degree AV block.  Right bundle branch block with repolarization changes.  PR interval 218 ms.  July 2017 ECG (independently read by me): Sinus rhythm at 69 bpm with right bundle branch block and first-degree AV block.  Q wave is present in lead 3.  April 2016 ECG (independently read by me): Normal sinus rhythm at 68 with right bundle branch block and repolarization changes.  First-degree AV block.  No ectopy  October 2015 ECG (independently read by me): Normal sinus rhythm with right bundle branch block.  Borderline first-degree AV block with PR interval 206 ms.  No ectopy  Prior February 2015 ECG (independently read by me): Normal sinus rhythm at 66 beats per minute, right bundle branch block with repolarization changes, first-degree AV block.  ECG from 12/12/2012: NSR at 69, RBBB  LABS:  BMP Latest Ref Rng & Units 09/26/2017 09/13/2016 06/04/2016  Glucose 65 - 99 mg/dL 129(H) 101(H) 199(H)  BUN 7 - 25 mg/dL '9 10 9  ' Creatinine 0.70 - 1.25 mg/dL 0.79 0.66(L) 0.64(L)  BUN/Creat Ratio 6 - 22 (calc) NOT APPLICABLE - -  Sodium 500 - 146 mmol/L 134(L) 134(L) 133(L)  Potassium 3.5 - 5.3 mmol/L 4.9 4.4 4.3  Chloride 98 - 110 mmol/L 100 98 98  CO2 20 - 32 mmol/L '28 28 23  ' Calcium 8.6 - 10.3 mg/dL 9.3 9.1 8.9    Hepatic Function Latest Ref Rng & Units 09/26/2017  09/13/2016 06/04/2016  Total Protein 6.1 - 8.1 g/dL 6.3 6.2 6.3  Albumin 3.6 - 5.1 g/dL - 3.8 4.2  AST 10 - 35 U/L '18 20 20  ' ALT 9 - 46 U/L '19 22 24  ' Alk Phosphatase 40 - 115 U/L - 54 55  Total Bilirubin 0.2 - 1.2 mg/dL 0.5 0.4 0.5    CBC Latest Ref Rng & Units 09/26/2017 09/13/2016 06/04/2016  WBC 3.8 - 10.8 Thousand/uL 6.0 7.0 7.8  Hemoglobin 13.2 - 17.1 g/dL 13.4 13.5 13.7  Hematocrit 38.5 - 50.0 % 39.6 41.7 42.3  Platelets 140 - 400 Thousand/uL 222 217 201   Lab Results  Component Value Date   MCV 86.8 09/26/2017   MCV 88.7 09/13/2016   MCV 89.8 06/04/2016    BNP    Component Value Date/Time   PROBNP 36.0 06/04/2010 2206    Lipid Panel     Component Value Date/Time   CHOL 134 09/26/2017 0821   CHOL 155 11/21/2012 1025   TRIG 49 09/26/2017 0821   TRIG 81 11/21/2012 1025   HDL 51 09/26/2017 0821   HDL 45 11/21/2012 1025   CHOLHDL 2.6 09/26/2017 0821   VLDL 10 09/13/2016 0822   LDLCALC 69 09/26/2017 0821   LDLCALC 94 11/21/2012 1025     RADIOLOGY: No results found.  IMPRESSION:  1. CAD S/P percutaneous coronary angioplasty   2. Essential hypertension   3. OSA (obstructive sleep apnea)   4. Morbid obesity ([place])   5. First degree heart block   6. RBBB   7. Elevated hemoglobin A1c     ASSESSMENT AND PLAN: Mr. Terrien Is a 67 year old male who has CAD and  is status post intervention to his proximal and mid LAD as well as his RCA.  A repeat cardiac catheterization by Dr. Ellyn Hack did not demonstrate significant cardiac etiology to his chest pain. He was empirically started on nitrate therapy but this has resulted in frequent migraine headaches. It is possible that some of his pain may have been due to spasm versus esophageal etiology or musculoskeletal symptomatology.  Presently, he is without anginal symptoms and is on carvedilol 25 mg twice a day, diltiazem 120 mg at bedtime, Micardis 40 mg, and furosemide 40 mg daily.Marland Kitchen  His blood pressure today is stable.  He has  chronic right bundle branch block and first-degree AV block which is stable and unchanged.  He is on rosuvastatin 40 mg for hyperlipidemia with target LDL less than 70.  Most recent lipid studies in April 2019 showed a total cholesterol 134 LDL 69 triglycerides 49 and HDL 51.  He continues to use CPAP with 100% compliance.  He denies issues with his mask.  He is morbidly obese with a BMI of 40.72.  I discussed the benefit of weight loss and exercise both with reference to hypertension control as well as benefit in his sleep apnea.  He has GERD which is controlled with pantoprazole 40 mg daily.  Times he has had issues with sleep initiation for which he takes zolpidem on an as-needed basis.  He is followed by Dr. Dennard Schaumann for his primary care.  Most recent hemoglobin A1c was elevated at 6.7 which is consistent with diabetes.  Dr. Dennard Schaumann is following this closely but he may require in the future initiation of metformin or other agent I will see him in 1 year for reevaluation.  Time spent: 25 minutes Troy Sine, MD, [facility]  12/19/2017 7:34 PM

## 2017-12-19 NOTE — Patient Instructions (Signed)

## 2018-01-10 ENCOUNTER — Encounter: Payer: Self-pay | Admitting: Family Medicine

## 2018-01-10 ENCOUNTER — Ambulatory Visit: Payer: 59 | Admitting: Family Medicine

## 2018-01-10 VITALS — BP 128/74 | HR 62 | Temp 97.8°F | Resp 14 | Ht 68.0 in | Wt 269.0 lb

## 2018-01-10 DIAGNOSIS — M549 Dorsalgia, unspecified: Secondary | ICD-10-CM

## 2018-01-10 DIAGNOSIS — M542 Cervicalgia: Secondary | ICD-10-CM

## 2018-01-10 DIAGNOSIS — M25511 Pain in right shoulder: Secondary | ICD-10-CM

## 2018-01-10 MED ORDER — PREDNISONE 20 MG PO TABS: ORAL_TABLET | ORAL | 0 refills | Status: DC

## 2018-01-10 NOTE — Progress Notes (Signed)
Subjective:    Patient ID: Lee [name], male    DOB: 27-Feb-1951, 67 y.o.   MRN: [id number]  HPI Patient presents with pain in the upper right back just to the left of his scapula but lateral to the midline.  It has been there for more than a month.  The pain is exacerbated by flexion of his chin.  He denies any falls or injuries.  The area of concern is not tender to palpation.  There is no visible deficit in the area or defect.  There is no rash.  The pain is a deep aching pain.  It keeps him from sleeping at night.  He is having to take Goody powders multiple times every day to control the pain.  He denies any numbness or tingling radiating down his right arm.  He does have some pain in his neck.  He states last time he had pain like this radiating to his shoulder blade, it was coming from a pinched nerve in his cervical spine.  He denies any GI issues.  He denies any right upper quadrant abdominal pain nausea or vomiting or melena.  He denies any cough or pleurisy or hemoptysis. Past Medical History:  Diagnosis Date  . Allergic rhinitis    chronic sinusitis  . Arthritis   . CAD (coronary artery disease)    a. 2009 PCI/DES to mLAD; b. 05/2010 s/p PCI RCA and LAD.; c. 05/2013 Cath: LAD mild ISR, LCX nl, RCA 40p, patent stent.  . Cataract    has had lasik surg  . COPD (chronic obstructive pulmonary disease) ([place])   . Diabetes mellitus type 2, controlled, without complications ([place])   . Diverticulosis   . DJD (degenerative joint disease)   . Esophagitis 1991   grade 1  . GERD (gastroesophageal reflux disease)    Hiatal hernia  . Hemorrhoids   . Hyperlipidemia   . Hypertensive heart disease   . Iron deficiency anemia   . Migraines   . Paroxysmal atrial fibrillation (HCC)    a. s/p afib ablation 10/22/08 (J. Allred).  . PVC's (premature ventricular contractions)   . Sleep apnea    Questionalble: RDI during the total sleep time 6h 28 mins was 3.55/hr during REM sleep was at 5.71/hr.  Supine AHI was 5.93/hr.   Past Surgical History:  Procedure Laterality Date  . CARDIAC CATHETERIZATION  05/2010   The proximal LAD was then predilated with 2.0 x 12 trek. this was then stented with a 2.5 x 16 promus Element drug-eluting stent at 14 atmosphere and prostdilated with 2.75 x 12 [place] trek at 16 atmospheres(2.8 mm) resulting the reduction of 80% proximal LAD stenosis to 0% residual with excellent flow.  Marland Kitchen CARDIAC CATHETERIZATION  06/05/2010   Successful percutaneous coronary intervention to the right coronary artery with percutaneous transluminal coronary angioplasty/stenting and insertion 3.0 x 16 mm Promus DES post dilated to 3.35 mm with stenosis being reduced to 0%  . CARDIAC CATHETERIZATION  02/2008   Post dilatation was performed using a 2.75 x 9 [place] sprinter, 10 atmospheres for 40 seconds and then 9 atmosphere for 35 sec. This resulted in the 80% area of narrowing pre-intervention, now appearing to normal. There was no edvidence of the dissection or thrombus and there was TIMI III flow pre and post.  . CARDIAC CATHETERIZATION  02/2006  . CORONARY ANGIOPLASTY WITH STENT PLACEMENT     3 stents/ 4 caths  . EYE SURGERY    . KNEE ARTHROSCOPY  right  . LASIK    . LEFT HEART CATHETERIZATION WITH CORONARY ANGIOGRAM N/A 06/29/2013   Procedure: LEFT HEART CATHETERIZATION WITH CORONARY ANGIOGRAM;  Surgeon: Leonie Man, MD;  Location: [facility] CATH LAB;  Service: Cardiovascular;  Laterality: N/A;  . NASAL SINUS SURGERY  2001  . NECK SURGERY     Cervical fusion  . RADIOFREQUENCY ABLATION  May 2010   atrial fibrillation  . ROTATOR CUFF REPAIR     left  . SHOULDER OPEN ROTATOR CUFF REPAIR     right  . SPINE SURGERY    . WRIST ARTHROCENTESIS     left   Current Outpatient Medications on File Prior to Visit  Medication Sig Dispense Refill  . aspirin 81 MG tablet Take 81 mg by mouth daily.    . baclofen (LIORESAL) 10 MG tablet   2  . carvedilol (COREG) 12.5 MG tablet TAKE 2 TABLETS BY  MOUTH 2  TIMES DAILY WITH A MEAL 360 tablet 3  . cholecalciferol (VITAMIN D) 1000 UNITS tablet Take 1,000 Units by mouth daily.    . citalopram (CELEXA) 40 MG tablet TAKE 1 TABLET(40 MG) BY MOUTH DAILY 90 tablet 3  . Coenzyme Q10 (CO Q 10) 100 MG CAPS Take 1 capsule by mouth daily.    . fexofenadine (ALLEGRA) 180 MG tablet Take by mouth.    . fluticasone (FLONASE) 50 MCG/ACT nasal spray INSTILL 2 SPRAYS IN THE  NOSE AT BEDTIME 48 g 3  . furosemide (LASIX) 40 MG tablet TAKE 1 TABLET BY MOUTH  DAILY 90 tablet 3  . Galcanezumab-gnlm (EMGALITY) 120 MG/ML SOAJ Inject into the skin every 30 (thirty) days.     . nitroGLYCERIN (NITROSTAT) 0.4 MG SL tablet PLACE ONE TABLET UNDER THE TONGUE EVERY 5 MINUTES AS NEEDED FOR CHEST PAIN 25 tablet 3  . oxyCODONE-acetaminophen (PERCOCET) 10-325 MG tablet Take 1 tablet by mouth every 8 (eight) hours as needed for pain. 21 tablet 0  . pantoprazole (PROTONIX) 40 MG tablet TAKE 1 TABLET BY MOUTH  EVERY DAY 90 tablet 3  . potassium chloride (MICRO-K) 10 MEQ CR capsule TAKE 1 CAPSULE BY MOUTH  EVERY DAY 90 capsule 3  . rosuvastatin (CRESTOR) 40 MG tablet TAKE 1 TABLET BY MOUTH  DAILY 90 tablet 1  . telmisartan (MICARDIS) 40 MG tablet TAKE 1 TABLET BY MOUTH  DAILY 90 tablet 3  . traMADol (ULTRAM) 50 MG tablet TAKE 1 TABLET(50 MG) BY MOUTH EVERY 8 HOURS AS NEEDED 30 tablet 0  . UNABLE TO FIND CPAP therapy    . zolpidem (AMBIEN) 10 MG tablet Take 1 tablet (10 mg total) by mouth at bedtime as needed for sleep. 30 tablet 3  . diltiazem (CARDIZEM CD) 120 MG 24 hr capsule Take 1 capsule (120 mg total) by mouth at bedtime. 90 capsule 3   No current facility-administered medications on file prior to visit.    Allergies  Allergen Reactions  . Ibuprofen Swelling  . Other Shortness Of Breath    BETA BLOCKERS  . Topamax [Topiramate] Other (See Comments)    Pt states it made his tongue feel "scalded" and he couldn't eat   . Toprol Xl [Metoprolol Succinate] Other (See  Comments)    Personality change  . Metoprolol-Hctz Er Other (See Comments)    Indigestion,mouth raw   Social History   Socioeconomic History  . Marital status: Married    Spouse name: Not on file  . Number of children: 2  . Years of education: Not  on file  . Highest education level: Not on file  Occupational History  . Occupation: retired  Scientific laboratory technician  . Financial resource strain: Not on file  . Food insecurity:    Worry: Not on file    Inability: Not on file  . Transportation needs:    Medical: Not on file    Non-medical: Not on file  Tobacco Use  . Smoking status: Former Smoker    Last attempt to quit: 06/01/1987    Years since quitting: 30.6  . Smokeless tobacco: Never Used  Substance and Sexual Activity  . Alcohol use: Yes    Alcohol/week: 0.0 standard drinks    Comment: once every 6-7 months  . Drug use: No  . Sexual activity: Not on file  Lifestyle  . Physical activity:    Days per week: Not on file    Minutes per session: Not on file  . Stress: Not on file  Relationships  . Social connections:    Talks on phone: Not on file    Gets together: Not on file    Attends religious service: Not on file    Active member of club or organization: Not on file    Attends meetings of clubs or organizations: Not on file    Relationship status: Not on file  . Intimate partner violence:    Fear of current or ex partner: Not on file    Emotionally abused: Not on file    Physically abused: Not on file    Forced sexual activity: Not on file  Other Topics Concern  . Not on file  Social History Narrative  . Not on file   Family History  Problem Relation Age of Onset  . Heart disease Father        and sister  . Diabetes Father   . Colon cancer Mother        mets from uterine  . Uterine cancer Mother   . Heart disease Sister      Review of Systems  All other systems reviewed and are negative.      Objective:   Physical Exam  Constitutional: He is oriented to  person, place, and time. He appears well-developed and well-nourished. No distress.  HENT:  Head: Normocephalic and atraumatic.  Right Ear: External ear normal.  Left Ear: External ear normal.  Nose: Nose normal.  Mouth/Throat: Oropharynx is clear and moist. No oropharyngeal exudate.  Eyes: Pupils are equal, round, and reactive to light. Conjunctivae and EOM are normal. Right eye exhibits no discharge. Left eye exhibits no discharge. No scleral icterus.  Neck: Normal range of motion. Neck supple. No JVD present. No tracheal deviation present. No thyromegaly present.  Cardiovascular: Normal rate, regular rhythm and intact distal pulses. Exam reveals no gallop and no friction rub.  Murmur heard. Pulmonary/Chest: Effort normal and breath sounds normal. No stridor. No respiratory distress. He has no wheezes. He has no rales. He exhibits no tenderness.  Abdominal: Soft. Bowel sounds are normal. He exhibits no distension and no mass. There is no tenderness. There is no rebound and no guarding. No hernia.  Musculoskeletal: Normal range of motion. He exhibits no edema or tenderness.       Thoracic back: He exhibits pain. He exhibits normal range of motion, no tenderness, no bony tenderness, no swelling, no edema, no deformity, no laceration, no spasm and normal pulse.       Back:  Lymphadenopathy:    He has no cervical adenopathy.  Neurological: He is alert and oriented to person, place, and time. He displays normal reflexes. No cranial nerve deficit or sensory deficit. He exhibits normal muscle tone. Coordination normal.  Skin: Skin is warm. Capillary refill takes less than 2 seconds. No rash noted. He is not diaphoretic. No erythema. No pallor.  Psychiatric: He has a normal mood and affect. His behavior is normal. Judgment and thought content normal.  Vitals reviewed.         Assessment & Plan:  Neck pain - Plan: CBC with Differential/Platelet, COMPLETE METABOLIC PANEL WITH GFR, DG Cervical  Spine Complete, DG Thoracic Spine W/Swimmers, predniSONE (DELTASONE) 20 MG tablet  Subscapular pain, right - Plan: CBC with Differential/Platelet, COMPLETE METABOLIC PANEL WITH GFR, DG Cervical Spine Complete, DG Thoracic Spine W/Swimmers  Mid back pain on right side - Plan: CBC with Differential/Platelet, COMPLETE METABOLIC PANEL WITH GFR, DG Cervical Spine Complete, DG Thoracic Spine W/Swimmers  Differential diagnosis includes radiating pain from herniated disc in the lower cervical or upper thoracic spine, muscular pain due to the trapezius muscle or latissimus dorsi, referred pain from a gastrointestinal problem, referred pain from a pulmonary problem.  Patient would like to try prednisone for possible herniated disc in his neck or upper back referring pain into his shoulder blade given the fact his pain has been similar there before when he had a pinched nerve in his neck.  Meanwhile obtain an x-ray of his cervical spine and thoracic spine to evaluate for degenerative disc disease or other pathologic process.  I will also obtain CMP to evaluate for any evidence of liver inflammation referring pain in the right shoulder blade.  If the x-rays are negative, if the lab work is negative, if the prednisone is not beneficial, I would recommend a physical therapy consultation for possible trapezius/latissimus dorsi muscular pain.  Consider imaging of the chest if the pain persist

## 2018-01-11 ENCOUNTER — Other Ambulatory Visit: Payer: Self-pay | Admitting: Family Medicine

## 2018-01-11 ENCOUNTER — Ambulatory Visit
Admission: RE | Admit: 2018-01-11 | Discharge: 2018-01-11 | Disposition: A | Discharge status: Home / Self Care | Payer: 59 | Source: Ambulatory Visit | Attending: Family Medicine | Admitting: Family Medicine

## 2018-01-11 DIAGNOSIS — M549 Dorsalgia, unspecified: Secondary | ICD-10-CM

## 2018-01-11 DIAGNOSIS — M542 Cervicalgia: Secondary | ICD-10-CM

## 2018-01-11 DIAGNOSIS — M25511 Pain in right shoulder: Secondary | ICD-10-CM

## 2018-01-11 LAB — COMPLETE METABOLIC PANEL WITH GFR
AG Ratio: 2.1 (calc) (ref 1.0–2.5)
ALT: 21 U/L (ref 9–46)
AST: 19 U/L (ref 10–35)
Albumin: 4.2 g/dL (ref 3.6–5.1)
Alkaline phosphatase (APISO): 65 U/L (ref 40–115)
BUN: 13 mg/dL (ref 7–25)
CALCIUM: 9.3 mg/dL (ref 8.6–10.3)
CO2: 27 mmol/L (ref 20–32)
CREATININE: 0.87 mg/dL (ref 0.70–1.25)
Chloride: 98 mmol/L (ref 98–110)
GFR, EST AFRICAN AMERICAN: 104 mL/min/{1.73_m2} (ref >=60)
GFR, EST NON AFRICAN AMERICAN: 89 mL/min/{1.73_m2} (ref >=60)
Globulin: 2 g/dL (calc) (ref 1.9–3.7)
Glucose, Bld: 110 mg/dL — ABNORMAL HIGH (ref 65–99)
Potassium: 4.5 mmol/L (ref 3.5–5.3)
SODIUM: 134 mmol/L — AB (ref 135–146)
TOTAL PROTEIN: 6.2 g/dL (ref 6.1–8.1)
Total Bilirubin: 0.4 mg/dL (ref 0.2–1.2)

## 2018-01-11 LAB — CBC WITH DIFFERENTIAL/PLATELET
BASOS PCT: 0.6 %
Basophils Absolute: 41 cells/uL (ref 0–200)
EOS PCT: 2.9 %
Eosinophils Absolute: 197 cells/uL (ref 15–500)
HEMATOCRIT: 39.7 % (ref 38.5–50.0)
HEMOGLOBIN: 13.2 g/dL (ref 13.2–17.1)
LYMPHS ABS: 1870 {cells}/uL (ref 850–3900)
MCH: 29 pg (ref 27.0–33.0)
MCHC: 33.2 g/dL (ref 32.0–36.0)
MCV: 87.3 fL (ref 80.0–100.0)
MPV: 9.7 fL (ref 7.5–12.5)
Monocytes Relative: 9.1 %
NEUTROS ABS: 4073 {cells}/uL (ref 1500–7800)
Neutrophils Relative %: 59.9 %
Platelets: 228 10*3/uL (ref 140–400)
RBC: 4.55 10*6/uL (ref 4.20–5.80)
RDW: 11.7 % (ref 11.0–15.0)
Total Lymphocyte: 27.5 %
WBC: 6.8 10*3/uL (ref 3.8–10.8)
WBCMIX: 619 {cells}/uL (ref 200–950)

## 2018-01-12 NOTE — Telephone Encounter (Signed)
Ok to refill??  Last office visit 01/10/2018.  Last refill 12/06/2017.

## 2018-01-16 ENCOUNTER — Other Ambulatory Visit: Payer: Self-pay | Admitting: Family Medicine

## 2018-01-16 DIAGNOSIS — M542 Cervicalgia: Secondary | ICD-10-CM

## 2018-01-24 ENCOUNTER — Other Ambulatory Visit: Payer: Self-pay | Admitting: Cardiovascular Disease

## 2018-01-25 ENCOUNTER — Other Ambulatory Visit: Payer: Self-pay | Admitting: Family Medicine

## 2018-01-25 NOTE — Telephone Encounter (Signed)
Patient requesting a refill on Oxycodone     LOV: 01/10/18  LRF:   11/03/17

## 2018-01-26 MED ORDER — OXYCODONE-ACETAMINOPHEN 10-325 MG PO TABS: 1.0000 | ORAL_TABLET | Freq: Three times a day (TID) | ORAL | 0 refills | Status: DC | PRN

## 2018-02-01 ENCOUNTER — Other Ambulatory Visit: Payer: Self-pay

## 2018-02-01 ENCOUNTER — Encounter: Payer: Self-pay | Admitting: Physical Therapy

## 2018-02-01 ENCOUNTER — Ambulatory Visit: Payer: 59 | Attending: Family Medicine | Admitting: Physical Therapy

## 2018-02-01 DIAGNOSIS — M542 Cervicalgia: Secondary | ICD-10-CM | POA: Diagnosis not present

## 2018-02-01 DIAGNOSIS — M25611 Stiffness of right shoulder, not elsewhere classified: Secondary | ICD-10-CM | POA: Insufficient documentation

## 2018-02-01 NOTE — Therapy (Signed)
[place], [place], [address] Phone: [phone]   Fax:  [phone]  Physical Therapy Evaluation  Patient Details  Name: Lee [name] MRN: [id number] Date of Birth: 03-22-1951 Referring Provider: Susy Frizzle, MD   Encounter Date: 02/01/2018  PT End of Session - 02/01/18 1335    Visit Number  1    Number of Visits  9    Date for PT Re-Evaluation  03/03/18    Authorization Type  [employer] 60 visit limit    PT Start Time  1330    PT Stop Time  1411    PT Time Calculation (min)  41 min    Activity Tolerance  Patient tolerated treatment well    Behavior During Therapy  [facility] for tasks assessed/performed       Past Medical History:  Diagnosis Date  . Allergic rhinitis    chronic sinusitis  . Arthritis   . CAD (coronary artery disease)    a. 2009 PCI/DES to mLAD; b. 05/2010 s/p PCI RCA and LAD.; c. 05/2013 Cath: LAD mild ISR, LCX nl, RCA 40p, patent stent.  . Cataract    has had lasik surg  . COPD (chronic obstructive pulmonary disease) ([place])   . Diabetes mellitus type 2, controlled, without complications ([place])   . Diverticulosis   . DJD (degenerative joint disease)   . Esophagitis 1991   grade 1  . GERD (gastroesophageal reflux disease)    Hiatal hernia  . Hemorrhoids   . Hyperlipidemia   . Hypertensive heart disease   . Iron deficiency anemia   . Migraines   . Paroxysmal atrial fibrillation (HCC)    a. s/p afib ablation 10/22/08 (J. Allred).  . PVC's (premature ventricular contractions)   . Sleep apnea    Questionalble: RDI during the total sleep time 6h 28 mins was 3.55/hr during REM sleep was at 5.71/hr. Supine AHI was 5.93/hr.    Past Surgical History:  Procedure Laterality Date  . CARDIAC CATHETERIZATION  05/2010   The proximal LAD was then predilated with 2.0 x 12 trek. this was then stented with a 2.5 x 16 promus Element drug-eluting stent at 14 atmosphere and prostdilated with 2.75 x  12 [place] trek at 16 atmospheres(2.8 mm) resulting the reduction of 80% proximal LAD stenosis to 0% residual with excellent flow.  Marland Kitchen CARDIAC CATHETERIZATION  06/05/2010   Successful percutaneous coronary intervention to the right coronary artery with percutaneous transluminal coronary angioplasty/stenting and insertion 3.0 x 16 mm Promus DES post dilated to 3.35 mm with stenosis being reduced to 0%  . CARDIAC CATHETERIZATION  02/2008   Post dilatation was performed using a 2.75 x 9 [place] sprinter, 10 atmospheres for 40 seconds and then 9 atmosphere for 35 sec. This resulted in the 80% area of narrowing pre-intervention, now appearing to normal. There was no edvidence of the dissection or thrombus and there was TIMI III flow pre and post.  . CARDIAC CATHETERIZATION  02/2006  . CORONARY ANGIOPLASTY WITH STENT PLACEMENT     3 stents/ 4 caths  . EYE SURGERY    . KNEE ARTHROSCOPY     right  . LASIK    . LEFT HEART CATHETERIZATION WITH CORONARY ANGIOGRAM N/A 06/29/2013   Procedure: LEFT HEART CATHETERIZATION WITH CORONARY ANGIOGRAM;  Surgeon: Leonie Man, MD;  Location: [facility] CATH LAB;  Service: Cardiovascular;  Laterality: N/A;  . NASAL SINUS SURGERY  2001  . NECK SURGERY     Cervical  fusion  . RADIOFREQUENCY ABLATION  May 2010   atrial fibrillation  . ROTATOR CUFF REPAIR     left  . SHOULDER OPEN ROTATOR CUFF REPAIR     right  . SPINE SURGERY    . WRIST ARTHROCENTESIS     left    There were no vitals filed for this visit.   Subjective Assessment - 02/01/18 1339    Subjective  It is almost constant pain. I cannot sleep on back or Rt side. Cervical extension is not painful but flexion pulls down to Rt shoulder blade. Had ACDF in cervical region. Pain began about 3 months ago. Has migraines and has for a long time. Denies N/t. Had a massage but did not really help.     How long can you sit comfortably?  occasional limitation    Patient Stated Goals  sleep on back/Rt side    Currently in Pain?   Yes    Pain Score  6     Pain Location  Scapula    Pain Orientation  Right;Medial    Pain Descriptors / Indicators  Sharp;Aching    Aggravating Factors   laying on Rt side, supine, cervical flexion    Pain Relieving Factors  lay on Lt side (takes about 30 min), sit in recliner (sometimes)         [facility] PT Assessment - 02/01/18 0001      Assessment   Medical Diagnosis  Rt periscapular pain    Referring Provider  Susy Frizzle, MD    Onset Date/Surgical Date  --   2-3 mo ago   Hand Dominance  Left    Prior Therapy  no      Balance Screen   Has the patient fallen in the past 6 months  Yes    How many times?  1    Has the patient had a decrease in activity level because of a fear of falling?   No    Is the patient reluctant to leave their home because of a fear of falling?   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      Cognition   Overall Cognitive Status  Within Functional Limits for tasks assessed      Observation/Other Assessments   Focus on Therapeutic Outcomes (FOTO)   35% limitation      Sensation   Additional Comments  WFL      Posture/Postural Control   Posture Comments  Rt shoulder elevation, flat thoracic spine, scapular winging in IR; bil rounded shoulders      ROM / Strength   AROM / PROM / Strength  Strength      Strength   Overall Strength Comments  bil GHJ ER 4/5      Palpation   Palpation comment  TTP Rt rhomboids & middle trap, levator scap & upper trap pain                Objective measurements completed on examination: See above findings.      [facility] Adult PT Treatment/Exercise - 02/01/18 0001      Exercises   Exercises  Neck      Neck Exercises: Seated   Other Seated Exercise  scapular retraction for resting posture      Manual Therapy   Manual Therapy  Soft tissue mobilization    Soft tissue mobilization  IASTM Rt upper trap, levator, rhomboids, middle  trap       Neck Exercises: Stretches   Upper Trapezius Stretch  Right;Left;30 seconds    Levator Stretch  Right;Left;30 seconds    Other Neck Stretches  door pec stretch @ 90 & 120             PT Education - 02/01/18 1515    Education Details  anatomy of condition, POC, HEP, exercise form/rationale    Person(s) Educated  Patient    Methods  Explanation;Demonstration;Tactile cues;Verbal cues;Handout    Comprehension  Verbalized understanding;Returned demonstration;Verbal cues required;Tactile cues required;Need further instruction          PT Long Term Goals - 02/01/18 1508      PT LONG TERM GOAL #1   Title  pt will be able to lay on his right side and in supine    Baseline  unable due to pain in Rt periscapular region    Time  4    Period  Weeks    Status  New    Target Date  03/03/18      PT LONG TERM GOAL #2   Title  5/5 ext rotation strength     Baseline  4/5 at eval    Time  4    Period  Weeks    Status  New    Target Date  03/03/18      PT LONG TERM GOAL #3   Title  pt will demo ROM with good scapular mobility    Baseline  poor mobility noted on Rt side vs left, resulting in GHJ elevation and overuse of upper traps    Time  4    Period  Weeks    Status  New    Target Date  03/03/18      PT LONG TERM GOAL #4   Title  pt will perform cervical flexion without pulling sensation    Baseline  pulling along Right side of neck down to scapula.     Time  4    Period  Weeks    Status  New    Target Date  03/03/18             Plan - 02/01/18 1353    Clinical Impression Statement  Pt presents to PT with complaints of Rt periscapular pain. Notable postural deviations and TTP in levator, rhomboids and middle/upper traps. Poor scapular mobility noted. Pt will benefit from skilled PT in order to improve mobility, decrease myofascial pain and challene postural strength and endurance.     Rehab Potential  Good    PT Frequency  2x / week    PT Duration  4 weeks    PT  Treatment/Interventions  ADLs/Self Care Home Management;Cryotherapy;Electrical Stimulation;Moist Heat;Traction;Therapeutic exercise;Therapeutic activities;Ultrasound;Patient/family education;Manual techniques;Taping;Dry needling;Passive range of motion    PT Next Visit Plan  TPDN & IASTM Rt periscapular region, scapular mobility    PT Home Exercise Plan  stretches: upper trap, levator, pecs; scap retraction    Consulted and Agree with Plan of Care  Patient       Patient will benefit from skilled therapeutic intervention in order to improve the following deficits and impairments:  Pain, Decreased strength, Improper body mechanics, Postural dysfunction, Increased muscle spasms, Decreased activity tolerance, Hypomobility  Visit Diagnosis: Cervicalgia - Plan: PT plan of care cert/re-cert  Stiffness of right shoulder, not elsewhere classified - Plan: PT plan of care cert/re-cert     Problem List Patient Active Problem List   Diagnosis Date Noted  .  Neck mass 10/14/2016  . Depression 05/20/2016  . Hypertensive heart disease   . Paroxysmal atrial fibrillation (HCC)   . Hyperlipidemia LDL goal <70 03/27/2014  . Premature ventricular contraction 03/04/2014  . Chest pain with high risk of acute coronary syndrome 06/28/2013  . Morbid obesity ([place]) 06/28/2013  . OSA (obstructive sleep apnea) 12/18/2012  . Sleepiness 11/06/2012  . Fatigue 11/06/2012  . PALPITATIONS 06/02/2009  . ATRIAL FIBRILLATION 10/16/2008  . DYSLIPIDEMIA 09/05/2008  . Essential hypertension 09/05/2008  . CAD S/P percutaneous coronary angioplasty 09/05/2008  . RBBB 09/05/2008  . SUPRAVENTRICULAR TACHYCARDIA 09/05/2008  . ALLERGIC RHINITIS 09/05/2008  . GERD 09/05/2008  . BACK PAIN 09/05/2008  . ARRHYTHMIA, HX OF 09/05/2008  . MIGRAINES, HX OF 09/05/2008  . Dyslipidemia 09/05/2008    Toney Lizaola C. Mckenzee Beem PT, DPT 02/01/18 3:18 PM   [facility] [address] [place], [place], [address] Phone: [phone]   Fax:  [phone]  Name: ELAN BRAINERD MRN: [id number] Date of Birth: 1951/03/05

## 2018-02-07 ENCOUNTER — Other Ambulatory Visit: Payer: Self-pay | Admitting: Cardiovascular Disease

## 2018-02-08 NOTE — Telephone Encounter (Signed)
Rx request sent to pharmacy.  

## 2018-02-14 ENCOUNTER — Encounter: Payer: Self-pay | Admitting: Physical Therapy

## 2018-02-14 ENCOUNTER — Ambulatory Visit: Payer: 59 | Admitting: Physical Therapy

## 2018-02-14 DIAGNOSIS — M25611 Stiffness of right shoulder, not elsewhere classified: Secondary | ICD-10-CM

## 2018-02-14 DIAGNOSIS — M542 Cervicalgia: Secondary | ICD-10-CM | POA: Diagnosis not present

## 2018-02-14 NOTE — Therapy (Signed)
[facility] [address] [place], [place], [address] Phone: [phone]   Fax:  [phone]  Physical Therapy Treatment  Patient Details  Name: Lee [name] MRN: [id number] Date of Birth: 05/03/1951 Referring Provider: Pickard, Warren T, MD   Encounter Date: 02/14/2018  PT End of Session - 02/14/18 1737    Visit Number  2    Number of Visits  9    Date for PT Re-Evaluation  03/03/18    PT Start Time  1631    PT Stop Time  1717    PT Time Calculation (min)  46 min    Activity Tolerance  Patient tolerated treatment well    Behavior During Therapy  WFL for tasks assessed/performed       Past Medical History:  Diagnosis Date  . Allergic rhinitis    chronic sinusitis  . Arthritis   . CAD (coronary artery disease)    a. 2009 PCI/DES to mLAD; b. 05/2010 s/p PCI RCA and LAD.; c. 05/2013 Cath: LAD mild ISR, LCX nl, RCA 40p, patent stent.  . Cataract    has had lasik surg  . COPD (chronic obstructive pulmonary disease) (HCC)   . Diabetes mellitus type 2, controlled, without complications (HCC)   . Diverticulosis   . DJD (degenerative joint disease)   . Esophagitis 1991   grade 1  . GERD (gastroesophageal reflux disease)    Hiatal hernia  . Hemorrhoids   . Hyperlipidemia   . Hypertensive heart disease   . Iron deficiency anemia   . Migraines   . Paroxysmal atrial fibrillation (HCC)    a. s/p afib ablation 10/22/08 (J. Allred).  . PVC's (premature ventricular contractions)   . Sleep apnea    Questionalble: RDI during the total sleep time 6h 28 mins was 3.55/hr during REM sleep was at 5.71/hr. Supine AHI was 5.93/hr.    Past Surgical History:  Procedure Laterality Date  . CARDIAC CATHETERIZATION  05/2010   The proximal LAD was then predilated with 2.0 x 12 trek. this was then stented with a 2.5 x 16 promus Element drug-eluting stent at 14 atmosphere and prostdilated with 2.75 x 12 [place] trek at 16 atmospheres(2.8 mm)  resulting the reduction of 80% proximal LAD stenosis to 0% residual with excellent flow.  . CARDIAC CATHETERIZATION  06/05/2010   Successful percutaneous coronary intervention to the right coronary artery with percutaneous transluminal coronary angioplasty/stenting and insertion 3.0 x 16 mm Promus DES post dilated to 3.35 mm with stenosis being reduced to 0%  . CARDIAC CATHETERIZATION  02/2008   Post dilatation was performed using a 2.75 x 9 [place] sprinter, 10 atmospheres for 40 seconds and then 9 atmosphere for 35 sec. This resulted in the 80% area of narrowing pre-intervention, now appearing to normal. There was no edvidence of the dissection or thrombus and there was TIMI III flow pre and post.  . CARDIAC CATHETERIZATION  02/2006  . CORONARY ANGIOPLASTY WITH STENT PLACEMENT     3 stents/ 4 caths  . EYE SURGERY    . KNEE ARTHROSCOPY     right  . LASIK    . LEFT HEART CATHETERIZATION WITH CORONARY ANGIOGRAM N/A 06/29/2013   Procedure: LEFT HEART CATHETERIZATION WITH CORONARY ANGIOGRAM;  Surgeon: David W Harding, MD;  Location: [facility] CATH LAB;  Service: Cardiovascular;  Laterality: N/A;  . NASAL SINUS SURGERY  2001  . NECK SURGERY     Cervical fusion  . RADIOFREQUENCY ABLATION  May 2010     atrial fibrillation  . ROTATOR CUFF REPAIR     left  . SHOULDER OPEN ROTATOR CUFF REPAIR     right  . SPINE SURGERY    . WRIST ARTHROCENTESIS     left    There were no vitals filed for this visit.  Subjective Assessment - 02/14/18 1632    Subjective  I worked the 9th of Sept and the pain went away  2 days later. i can sleep any way I want .  I an back to work and am able to do everything without pain.  I still hasve tightnedd.  i have been doing the work with posture.  i have not been doing th exercise.      Currently in Pain?  No/denies    Pain Location  Neck    Pain Orientation  Right   into right neck and  medial scapula.   tight only   Aggravating Factors   lifting    Pain Relieving Factors   working on posture.                       [facility] Adult PT Treatment/Exercise - 02/14/18 0001      Shoulder Exercises: Sidelying   External Rotation  10 reps;AROM    External Rotation Weight (lbs)  0    Flexion  5 reps;AROM   with scapula guiding     Shoulder Exercises: Standing   External Rotation  10 reps    Theraband Level (Shoulder External Rotation)  Level 3 (Green)    External Rotation Limitations  HEP,  no pain    Row  10 reps    Theraband Level (Shoulder Row)  Level 3 (Green)    Row Limitations  holds right shoulder higher      Manual Therapy   Manual Therapy  Soft tissue mobilization    Manual therapy comments  trigger point release levator    Soft tissue mobilization  IASTM Rt upper trap, levator, rhomboids, middle trap   also scalenes right,  strumming to lengthen when soft     Neck Exercises: Stretches   Upper Trapezius Stretch  Right;Left;30 seconds   cued to lessen stretch to avoid pain    Levator Stretch  Right;Left;30 seconds    Other Neck Stretches  door pec stretch @ 90 & 120   good technique            PT Education - 02/14/18 1732    Education Details  HEP    Methods  Explanation;Demonstration;Tactile cues;Verbal cues;Handout    Comprehension  Returned demonstration;Verbalized understanding          PT Long Term Goals - 02/14/18 1744      PT LONG TERM GOAL #1   Title  pt will be able to lay on his right side and in supine    Baseline  able,  consistant?    Time  4    Period  Weeks    Status  Partially Met      PT LONG TERM GOAL #2   Title  5/5 ext rotation strength     Time  4    Period  Weeks    Status  Unable to assess      PT LONG TERM GOAL #3   Title  pt will demo ROM with good scapular mobility    Baseline  improving mobility/ Range    Time  4    Period  Weeks      Status  On-going      PT LONG TERM GOAL #4   Title  pt will perform cervical flexion without pulling sensation    Baseline  pulls prior to manual     Time  4    Period  Weeks    Status  On-going            Plan - 02/14/18 1738    Clinical Impression Statement  Patient is reporting being able to return to work and pain intermittant.  Tightness was decreased post manual.  He was able to progress HEP without pain.      PT Next Visit Plan  TPDN & IASTM Rt periscapular region, scapular mobility.  review HEP    PT Home Exercise Plan  stretches: upper trap, levator, pecs; scap retraction,  consider prone.     Consulted and Agree with Plan of Care  Patient       Patient will benefit from skilled therapeutic intervention in order to improve the following deficits and impairments:     Visit Diagnosis: Cervicalgia  Stiffness of right shoulder, not elsewhere classified     Problem List Patient Active Problem List   Diagnosis Date Noted  . Neck mass 10/14/2016  . Depression 05/20/2016  . Hypertensive heart disease   . Paroxysmal atrial fibrillation (HCC)   . Hyperlipidemia LDL goal <70 03/27/2014  . Premature ventricular contraction 03/04/2014  . Chest pain with high risk of acute coronary syndrome 06/28/2013  . Morbid obesity ([place]) 06/28/2013  . OSA (obstructive sleep apnea) 12/18/2012  . Sleepiness 11/06/2012  . Fatigue 11/06/2012  . PALPITATIONS 06/02/2009  . ATRIAL FIBRILLATION 10/16/2008  . DYSLIPIDEMIA 09/05/2008  . Essential hypertension 09/05/2008  . CAD S/P percutaneous coronary angioplasty 09/05/2008  . RBBB 09/05/2008  . SUPRAVENTRICULAR TACHYCARDIA 09/05/2008  . ALLERGIC RHINITIS 09/05/2008  . GERD 09/05/2008  . BACK PAIN 09/05/2008  . ARRHYTHMIA, HX OF 09/05/2008  . MIGRAINES, HX OF 09/05/2008  . Dyslipidemia 09/05/2008    Jayleigh Notarianni PTA 02/14/2018, 5:47 PM  [facility] [address] [place], [place], [address] Phone: [phone]   Fax:  [phone]  Name: Lee [name] MRN: [id number] Date of Birth: 02/03/1951

## 2018-02-14 NOTE — Patient Instructions (Signed)
Resisted External Rotation: in Neutral - Bilateral    Sit or stand, tubing in both hands, elbows at sides, bent to 90, forearms forward. Pinch shoulder blades together and rotate forearms out. Keep elbows at sides. Repeat _10___ times per set. Do 1-3____ sets per session. Do _1___ sessions per day.  http://orth.exer.us/966   Copyright  VHI. All rights reserved.  Scapular Retraction: Rowing (Eccentric) - Arms - Side (Resistance Band)    Hold end of band in each hand. Pull back until elbows are even with trunk. Keep elbows by sides, thumbs up. Slowly release for 3-5 seconds. Use ___Blue_____ resistance band. _10_-30_ reps per set, 1___ set per day.   http://ecce.exer.us/226   Copyright  VHI. All rights reserved.

## 2018-02-15 ENCOUNTER — Ambulatory Visit: Payer: 59 | Admitting: Physical Therapy

## 2018-02-21 ENCOUNTER — Ambulatory Visit: Payer: 59 | Admitting: Physical Therapy

## 2018-02-21 ENCOUNTER — Encounter: Payer: Self-pay | Admitting: Physical Therapy

## 2018-02-21 DIAGNOSIS — M542 Cervicalgia: Secondary | ICD-10-CM

## 2018-02-21 DIAGNOSIS — M25611 Stiffness of right shoulder, not elsewhere classified: Secondary | ICD-10-CM

## 2018-02-21 NOTE — Therapy (Addendum)
[place], [place], [address] Phone: [phone]   Fax:  [phone]  Physical Therapy Treatment/Discharge  Patient Details  Name: Lee [name] MRN: [id number] Date of Birth: Dec 21, 1950 Referring Provider: Susy Frizzle, MD   Encounter Date: 02/21/2018  PT End of Session - 02/21/18 1708    Visit Number  3    Number of Visits  9    Date for PT Re-Evaluation  03/03/18    PT Start Time  3810   Short session due to patient well and did not need to stay for full time slot.   PT Stop Time  1658    PT Time Calculation (min)  25 min    Activity Tolerance  Patient tolerated treatment well    Behavior During Therapy  WFL for tasks assessed/performed       Past Medical History:  Diagnosis Date  . Allergic rhinitis    chronic sinusitis  . Arthritis   . CAD (coronary artery disease)    a. 2009 PCI/DES to mLAD; b. 05/2010 s/p PCI RCA and LAD.; c. 05/2013 Cath: LAD mild ISR, LCX nl, RCA 40p, patent stent.  . Cataract    has had lasik surg  . COPD (chronic obstructive pulmonary disease) ([place])   . Diabetes mellitus type 2, controlled, without complications ([place])   . Diverticulosis   . DJD (degenerative joint disease)   . Esophagitis 1991   grade 1  . GERD (gastroesophageal reflux disease)    Hiatal hernia  . Hemorrhoids   . Hyperlipidemia   . Hypertensive heart disease   . Iron deficiency anemia   . Migraines   . Paroxysmal atrial fibrillation (HCC)    a. s/p afib ablation 10/22/08 (J. Allred).  . PVC's (premature ventricular contractions)   . Sleep apnea    Questionalble: RDI during the total sleep time 6h 28 mins was 3.55/hr during REM sleep was at 5.71/hr. Supine AHI was 5.93/hr.    Past Surgical History:  Procedure Laterality Date  . CARDIAC CATHETERIZATION  05/2010   The proximal LAD was then predilated with 2.0 x 12 trek. this was then stented with a 2.5 x 16 promus Element drug-eluting  stent at 14 atmosphere and prostdilated with 2.75 x 12 [place] trek at 16 atmospheres(2.8 mm) resulting the reduction of 80% proximal LAD stenosis to 0% residual with excellent flow.  Marland Kitchen CARDIAC CATHETERIZATION  06/05/2010   Successful percutaneous coronary intervention to the right coronary artery with percutaneous transluminal coronary angioplasty/stenting and insertion 3.0 x 16 mm Promus DES post dilated to 3.35 mm with stenosis being reduced to 0%  . CARDIAC CATHETERIZATION  02/2008   Post dilatation was performed using a 2.75 x 9 [place] sprinter, 10 atmospheres for 40 seconds and then 9 atmosphere for 35 sec. This resulted in the 80% area of narrowing pre-intervention, now appearing to normal. There was no edvidence of the dissection or thrombus and there was TIMI III flow pre and post.  . CARDIAC CATHETERIZATION  02/2006  . CORONARY ANGIOPLASTY WITH STENT PLACEMENT     3 stents/ 4 caths  . EYE SURGERY    . KNEE ARTHROSCOPY     right  . LASIK    . LEFT HEART CATHETERIZATION WITH CORONARY ANGIOGRAM N/A 06/29/2013   Procedure: LEFT HEART CATHETERIZATION WITH CORONARY ANGIOGRAM;  Surgeon: Leonie Man, MD;  Location: [facility] CATH LAB;  Service: Cardiovascular;  Laterality: N/A;  . NASAL SINUS SURGERY  2001  .  NECK SURGERY     Cervical fusion  . RADIOFREQUENCY ABLATION  May 2010   atrial fibrillation  . ROTATOR CUFF REPAIR     left  . SHOULDER OPEN ROTATOR CUFF REPAIR     right  . SPINE SURGERY    . WRIST ARTHROCENTESIS     left    There were no vitals filed for this visit.  Subjective Assessment - 02/21/18 1704    Subjective  I missed last visit because I forgot.  I called the office the next day to say sorry.  I have no Pain.  I was able to take an almost full 5 gallon of gas out of my truck without difficulty.  I have done the exercises once.  I  forget to do them because i dont have any pain.  To be honest , If I got more exercises I would not do them.  I have been working on my posture even  with walking and i have stopped reading with poor posture.        Currently in Pain?  No/denies    Pain Score  0-No pain    Pain Location  Neck    Pain Orientation  Right    Pain Relieving Factors  posture work and being more active at work.          [facility] PT Assessment - 02/21/18 0001      Observation/Other Assessments   Focus on Therapeutic Outcomes (FOTO)   34% limitation      Strength   Overall Strength Comments  5/5                   [facility] Adult PT Treatment/Exercise - 02/21/18 0001      Shoulder Exercises: Standing   External Rotation  15 reps    Theraband Level (Shoulder External Rotation)  Level 4 (Blue)    External Rotation Limitations  no pain    Row  20 reps    Theraband Level (Shoulder Row)  Level 4 (Blue)    Row Limitations  no pain      Neck Exercises: Stretches   Upper Trapezius Stretch  --   checked no questions   Levator Stretch  --   checked no questions   Other Neck Stretches  pec stretch at 90                  PT Long Term Goals - 02/21/18 1645      PT LONG TERM GOAL #1   Title  pt will be able to lay on his right side and in supine    Baseline  able    Time  4    Period  Weeks    Status  Achieved      PT LONG TERM GOAL #2   Title  5/5 ext rotation strength     Baseline  5/5    Time  4    Period  Weeks    Status  Achieved      PT LONG TERM GOAL #3   Title  pt will demo ROM with good scapular mobility    Baseline  WNL,  good scapular mobility,  no pain    Time  4    Period  Weeks    Status  Achieved      PT LONG TERM GOAL #4   Title  pt will perform cervical flexion without pulling sensation    Baseline  slight  pulling  95 % improvement    Time  4    Period  Weeks    Status  Partially Met            Plan - 02/21/18 1708    Clinical Impression Statement  Patient has met all goals except LTG #4.  He has slight pulling ( no pain)  with cervical flexion.  He has had no pain since the last visit.  He has  returned to all ADL's .  He was not interested in more HEP.  He is well satisfied  with PT and feels he no longer needs to attend.    FOTO 66% ability,  65 % predicted.  ER MMT 5/5.    PT Next Visit Plan  D/C at patient's request.      PT Home Exercise Plan  stretches: upper trap, levator, pecs; scap retraction,  row,  ER bands     Consulted and Agree with Plan of Care  Patient       Patient will benefit from skilled therapeutic intervention in order to improve the following deficits and impairments:     Visit Diagnosis: Cervicalgia  Stiffness of right shoulder, not elsewhere classified     Problem List Patient Active Problem List   Diagnosis Date Noted  . Neck mass 10/14/2016  . Depression 05/20/2016  . Hypertensive heart disease   . Paroxysmal atrial fibrillation (HCC)   . Hyperlipidemia LDL goal <70 03/27/2014  . Premature ventricular contraction 03/04/2014  . Chest pain with high risk of acute coronary syndrome 06/28/2013  . Morbid obesity ([place]) 06/28/2013  . OSA (obstructive sleep apnea) 12/18/2012  . Sleepiness 11/06/2012  . Fatigue 11/06/2012  . PALPITATIONS 06/02/2009  . ATRIAL FIBRILLATION 10/16/2008  . DYSLIPIDEMIA 09/05/2008  . Essential hypertension 09/05/2008  . CAD S/P percutaneous coronary angioplasty 09/05/2008  . RBBB 09/05/2008  . SUPRAVENTRICULAR TACHYCARDIA 09/05/2008  . ALLERGIC RHINITIS 09/05/2008  . GERD 09/05/2008  . BACK PAIN 09/05/2008  . ARRHYTHMIA, HX OF 09/05/2008  . MIGRAINES, HX OF 09/05/2008  . Dyslipidemia 09/05/2008    Zekiah Caruth PTA 02/21/2018, 5:17 PM  [facility] [address] [place], [place], [address] Phone: [phone]   Fax:  [phone]  Name: TYRIS ELIOT MRN: [id number] Date of Birth: 1951/04/06  PHYSICAL THERAPY DISCHARGE SUMMARY  Visits from Start of Care: 3  Current functional level related to goals / functional outcomes: See above   Remaining  deficits: See above   Education / Equipment: Anatomy of condition, POC, HEP, exercise form/rationale  Plan: Patient agrees to discharge.  Patient goals were partially met. Patient is being discharged due to being pleased with the current functional level.  ?????     Jessica C. Hightower PT, DPT 02/21/18 9:00 PM

## 2018-02-21 NOTE — Therapy (Signed)
[place], [place], [address] Phone: [phone]   Fax:  [phone]  Physical Therapy Treatment  Patient Details  Name: Lee [name] MRN: [id number] Date of Birth: 1950/09/08 Referring Provider: Susy Frizzle, MD   Encounter Date: 02/21/2018  PT End of Session - 02/21/18 1708    Visit Number  3    Number of Visits  9    Date for PT Re-Evaluation  03/03/18    PT Start Time  0263   Short session due to patient well and did not need to stay for full time slot.   PT Stop Time  1658    PT Time Calculation (min)  25 min    Activity Tolerance  Patient tolerated treatment well    Behavior During Therapy  WFL for tasks assessed/performed       Past Medical History:  Diagnosis Date  . Allergic rhinitis    chronic sinusitis  . Arthritis   . CAD (coronary artery disease)    a. 2009 PCI/DES to mLAD; b. 05/2010 s/p PCI RCA and LAD.; c. 05/2013 Cath: LAD mild ISR, LCX nl, RCA 40p, patent stent.  . Cataract    has had lasik surg  . COPD (chronic obstructive pulmonary disease) ([place])   . Diabetes mellitus type 2, controlled, without complications ([place])   . Diverticulosis   . DJD (degenerative joint disease)   . Esophagitis 1991   grade 1  . GERD (gastroesophageal reflux disease)    Hiatal hernia  . Hemorrhoids   . Hyperlipidemia   . Hypertensive heart disease   . Iron deficiency anemia   . Migraines   . Paroxysmal atrial fibrillation (HCC)    a. s/p afib ablation 10/22/08 (J. Allred).  . PVC's (premature ventricular contractions)   . Sleep apnea    Questionalble: RDI during the total sleep time 6h 28 mins was 3.55/hr during REM sleep was at 5.71/hr. Supine AHI was 5.93/hr.    Past Surgical History:  Procedure Laterality Date  . CARDIAC CATHETERIZATION  05/2010   The proximal LAD was then predilated with 2.0 x 12 trek. this was then stented with a 2.5 x 16 promus Element drug-eluting stent at 14  atmosphere and prostdilated with 2.75 x 12 [place] trek at 16 atmospheres(2.8 mm) resulting the reduction of 80% proximal LAD stenosis to 0% residual with excellent flow.  Marland Kitchen CARDIAC CATHETERIZATION  06/05/2010   Successful percutaneous coronary intervention to the right coronary artery with percutaneous transluminal coronary angioplasty/stenting and insertion 3.0 x 16 mm Promus DES post dilated to 3.35 mm with stenosis being reduced to 0%  . CARDIAC CATHETERIZATION  02/2008   Post dilatation was performed using a 2.75 x 9 [place] sprinter, 10 atmospheres for 40 seconds and then 9 atmosphere for 35 sec. This resulted in the 80% area of narrowing pre-intervention, now appearing to normal. There was no edvidence of the dissection or thrombus and there was TIMI III flow pre and post.  . CARDIAC CATHETERIZATION  02/2006  . CORONARY ANGIOPLASTY WITH STENT PLACEMENT     3 stents/ 4 caths  . EYE SURGERY    . KNEE ARTHROSCOPY     right  . LASIK    . LEFT HEART CATHETERIZATION WITH CORONARY ANGIOGRAM N/A 06/29/2013   Procedure: LEFT HEART CATHETERIZATION WITH CORONARY ANGIOGRAM;  Surgeon: Leonie Man, MD;  Location: [facility] CATH LAB;  Service: Cardiovascular;  Laterality: N/A;  . NASAL SINUS SURGERY  2001  .  NECK SURGERY     Cervical fusion  . RADIOFREQUENCY ABLATION  May 2010   atrial fibrillation  . ROTATOR CUFF REPAIR     left  . SHOULDER OPEN ROTATOR CUFF REPAIR     right  . SPINE SURGERY    . WRIST ARTHROCENTESIS     left    There were no vitals filed for this visit.  Subjective Assessment - 02/21/18 1704    Subjective  I missed last visit because I forgot.  I called the office the next day to say sorry.  I have no Pain.  I was able to take an almost full 5 gallon of gas out of my truck without difficulty.  I have done the exercises once.  I  forget to do them because i dont have any pain.  To be honest , If I got more exercises I would not do them.  I have been working on my posture even with walking  and i have stopped reading with poor posture.        Currently in Pain?  No/denies    Pain Score  0-No pain    Pain Location  Neck    Pain Orientation  Right    Pain Relieving Factors  posture work and being more active at work.          [facility] PT Assessment - 02/21/18 0001      Observation/Other Assessments   Focus on Therapeutic Outcomes (FOTO)   34% limitation      Strength   Overall Strength Comments  5/5                   [facility] Adult PT Treatment/Exercise - 02/21/18 0001      Shoulder Exercises: Standing   External Rotation  15 reps    Theraband Level (Shoulder External Rotation)  Level 4 (Blue)    External Rotation Limitations  no pain    Row  20 reps    Theraband Level (Shoulder Row)  Level 4 (Blue)    Row Limitations  no pain      Neck Exercises: Stretches   Upper Trapezius Stretch  --   checked no questions   Levator Stretch  --   checked no questions   Other Neck Stretches  pec stretch at 90                  PT Long Term Goals - 02/21/18 1645      PT LONG TERM GOAL #1   Title  pt will be able to lay on his right side and in supine    Baseline  able    Time  4    Period  Weeks    Status  Achieved      PT LONG TERM GOAL #2   Title  5/5 ext rotation strength     Baseline  5/5    Time  4    Period  Weeks    Status  Achieved      PT LONG TERM GOAL #3   Title  pt will demo ROM with good scapular mobility    Baseline  WNL,  good scapular mobility,  no pain    Time  4    Period  Weeks    Status  Achieved      PT LONG TERM GOAL #4   Title  pt will perform cervical flexion without pulling sensation    Baseline  slight  pulling  95 % improvement    Time  4    Period  Weeks    Status  Partially Met            Plan - 02/21/18 1708    Clinical Impression Statement  Patient has met all goals except LTG #4.  He has slight pulling ( no pain)  with cervical flexion.  He has had no pain since the last visit.  He has returned to  all ADL's .  He was not interested in more HEP.  He is well satisfied  with PT and feels he no longer needs to attend.    FOTO 66% ability,  65 % predicted.  ER MMT 5/5.    PT Next Visit Plan  D/C at patient's request.      PT Home Exercise Plan  stretches: upper trap, levator, pecs; scap retraction,  row,  ER bands     Consulted and Agree with Plan of Care  Patient       Patient will benefit from skilled therapeutic intervention in order to improve the following deficits and impairments:     Visit Diagnosis: Cervicalgia  Stiffness of right shoulder, not elsewhere classified     Problem List Patient Active Problem List   Diagnosis Date Noted  . Neck mass 10/14/2016  . Depression 05/20/2016  . Hypertensive heart disease   . Paroxysmal atrial fibrillation (HCC)   . Hyperlipidemia LDL goal <70 03/27/2014  . Premature ventricular contraction 03/04/2014  . Chest pain with high risk of acute coronary syndrome 06/28/2013  . Morbid obesity ([place]) 06/28/2013  . OSA (obstructive sleep apnea) 12/18/2012  . Sleepiness 11/06/2012  . Fatigue 11/06/2012  . PALPITATIONS 06/02/2009  . ATRIAL FIBRILLATION 10/16/2008  . DYSLIPIDEMIA 09/05/2008  . Essential hypertension 09/05/2008  . CAD S/P percutaneous coronary angioplasty 09/05/2008  . RBBB 09/05/2008  . SUPRAVENTRICULAR TACHYCARDIA 09/05/2008  . ALLERGIC RHINITIS 09/05/2008  . GERD 09/05/2008  . BACK PAIN 09/05/2008  . ARRHYTHMIA, HX OF 09/05/2008  . MIGRAINES, HX OF 09/05/2008  . Dyslipidemia 09/05/2008    HARRIS,KAREN PTA 02/21/2018, 5:15 PM  [facility] [address] [place], [place], [address] Phone: [phone]   Fax:  [phone]  Name: Lee [name] MRN: [id number] Date of Birth: [birth year]

## 2018-02-23 ENCOUNTER — Ambulatory Visit: Payer: 59 | Admitting: Physical Therapy

## 2018-02-28 ENCOUNTER — Ambulatory Visit: Payer: 59 | Admitting: Physical Therapy

## 2018-03-02 ENCOUNTER — Ambulatory Visit: Payer: 59 | Admitting: Physical Therapy

## 2018-03-06 ENCOUNTER — Other Ambulatory Visit: Payer: Self-pay | Admitting: Family Medicine

## 2018-03-07 ENCOUNTER — Encounter: Payer: 59 | Admitting: Physical Therapy

## 2018-03-07 NOTE — Telephone Encounter (Signed)
Ok to refill??  Last office visit 01/10/2018.  Last refill on Tramadol 01/12/2018.  Last refill on Ambien 08/18/2017, #3 refills.

## 2018-03-09 ENCOUNTER — Encounter: Payer: 59 | Admitting: Physical Therapy

## 2018-03-28 ENCOUNTER — Encounter: Payer: Self-pay | Admitting: Family Medicine

## 2018-03-28 ENCOUNTER — Ambulatory Visit (INDEPENDENT_AMBULATORY_CARE_PROVIDER_SITE_OTHER): Payer: 59 | Admitting: Family Medicine

## 2018-03-28 VITALS — BP 140/86 | HR 67 | Temp 97.6°F | Resp 16 | Ht 68.0 in | Wt 271.0 lb

## 2018-03-28 DIAGNOSIS — H9113 Presbycusis, bilateral: Secondary | ICD-10-CM

## 2018-03-28 NOTE — Progress Notes (Signed)
   Subjective:    Patient ID: Lee [name], male    DOB: November 11, 1950, 67 y.o.   MRN: [id number]  HPI She is requesting a referral to audiology.  When he called my office, he was told that he needed an office visit before we can place a referral.  Patient has had hearing aids for many years.  His current hearing aids are malfunctioning.  He wants to go back to see his audiologist to have the hearing aids evaluated, repaired if possible, or replaced.  Examination of his auditory canals reveals no obstruction or pathologic process with the tympanic membrane.  The patient has no other medical issues today and simply wants a referral back to see his previous audiologist.   Review of Systems     Objective:   Physical Exam        Assessment & Plan:  Presbycusis of both ears - Plan: Ambulatory referral to Audiology  Patient will not be charged an office visit.  I will refer him back to his audiologist to have his hearing aids evaluated.  Examination of his ears today revealed no reversible causes of hearing loss.  Therefore he needs to have the hearing aids evaluated.

## 2018-04-13 ENCOUNTER — Other Ambulatory Visit: Payer: Self-pay | Admitting: Cardiovascular Disease

## 2018-04-26 ENCOUNTER — Other Ambulatory Visit: Payer: Self-pay | Admitting: Family Medicine

## 2018-04-26 MED ORDER — OXYCODONE-ACETAMINOPHEN 10-325 MG PO TABS: 1.0000 | ORAL_TABLET | Freq: Three times a day (TID) | ORAL | 0 refills | Status: DC | PRN

## 2018-04-26 NOTE — Telephone Encounter (Signed)
Refill on oxycodone to [facility] [address]

## 2018-04-26 NOTE — Telephone Encounter (Signed)
Patient requesting a refill on Oxycodone     LOV: 03/28/18  LRF:       01/26/18

## 2018-05-09 ENCOUNTER — Other Ambulatory Visit: Payer: Self-pay | Admitting: *Deleted

## 2018-05-09 MED ORDER — TRAMADOL HCL 50 MG PO TABS: ORAL_TABLET | ORAL | 0 refills | Status: DC

## 2018-05-09 NOTE — Telephone Encounter (Signed)
Received fax requesting refill on Tramadol.   Ok to refill??  Last office visit 03/28/2018.  Last refill 03/07/2018.

## 2018-06-28 ENCOUNTER — Other Ambulatory Visit: Payer: Self-pay | Admitting: Cardiovascular Disease

## 2018-07-07 ENCOUNTER — Other Ambulatory Visit: Payer: Self-pay | Admitting: Cardiovascular Disease

## 2018-07-10 NOTE — Telephone Encounter (Signed)
Rx(s) sent to pharmacy electronically.  

## 2018-07-26 ENCOUNTER — Other Ambulatory Visit: Payer: Self-pay | Admitting: Family Medicine

## 2018-07-26 NOTE — Telephone Encounter (Signed)
Refill on oxycodone to [facility] [address]

## 2018-07-26 NOTE — Telephone Encounter (Signed)
Patient requesting a refill on Oxycodone     LOV: 03/28/18  LRF:  04/26/18

## 2018-07-27 MED ORDER — OXYCODONE-ACETAMINOPHEN 10-325 MG PO TABS: 1.0000 | ORAL_TABLET | Freq: Three times a day (TID) | ORAL | 0 refills | Status: DC | PRN

## 2018-08-22 ENCOUNTER — Other Ambulatory Visit: Payer: Self-pay | Admitting: Family Medicine

## 2018-08-25 ENCOUNTER — Other Ambulatory Visit: Payer: Self-pay | Admitting: Family Medicine

## 2018-08-25 NOTE — Telephone Encounter (Signed)
Requesting refill    Tramadol  LOV: 03/28/18   LRF:  05/09/18

## 2018-08-30 DIAGNOSIS — R55 Syncope and collapse: Secondary | ICD-10-CM

## 2018-08-30 HISTORY — DX: Syncope and collapse: R55

## 2018-09-20 ENCOUNTER — Encounter (HOSPITAL_COMMUNITY): Payer: Self-pay | Admitting: *Deleted

## 2018-09-20 ENCOUNTER — Ambulatory Visit (HOSPITAL_COMMUNITY): Admit: 2018-09-20 | Payer: 59 | Admitting: Cardiovascular Disease

## 2018-09-20 ENCOUNTER — Other Ambulatory Visit: Payer: Self-pay

## 2018-09-20 ENCOUNTER — Observation Stay (HOSPITAL_COMMUNITY): Payer: Medicare Other

## 2018-09-20 ENCOUNTER — Encounter (HOSPITAL_COMMUNITY)
Admission: EM | Disposition: A | Discharge status: Home / Self Care | Payer: Self-pay | Source: Home / Self Care | Attending: Emergency Medicine

## 2018-09-20 ENCOUNTER — Observation Stay (HOSPITAL_COMMUNITY)
Admission: EM | Admit: 2018-09-20 | Discharge: 2018-09-21 | Disposition: A | Discharge status: Home / Self Care | Payer: Medicare Other | Attending: Cardiovascular Disease | Admitting: Cardiovascular Disease

## 2018-09-20 ENCOUNTER — Observation Stay (HOSPITAL_BASED_OUTPATIENT_CLINIC_OR_DEPARTMENT_OTHER): Payer: Medicare Other

## 2018-09-20 ENCOUNTER — Emergency Department (HOSPITAL_COMMUNITY): Payer: Medicare Other

## 2018-09-20 DIAGNOSIS — Z955 Presence of coronary angioplasty implant and graft: Secondary | ICD-10-CM | POA: Diagnosis not present

## 2018-09-20 DIAGNOSIS — Z87891 Personal history of nicotine dependence: Secondary | ICD-10-CM | POA: Diagnosis not present

## 2018-09-20 DIAGNOSIS — R55 Syncope and collapse: Principal | ICD-10-CM | POA: Insufficient documentation

## 2018-09-20 DIAGNOSIS — Z79899 Other long term (current) drug therapy: Secondary | ICD-10-CM | POA: Diagnosis not present

## 2018-09-20 DIAGNOSIS — Z7982 Long term (current) use of aspirin: Secondary | ICD-10-CM | POA: Insufficient documentation

## 2018-09-20 DIAGNOSIS — I2510 Atherosclerotic heart disease of native coronary artery without angina pectoris: Secondary | ICD-10-CM | POA: Insufficient documentation

## 2018-09-20 DIAGNOSIS — F329 Major depressive disorder, single episode, unspecified: Secondary | ICD-10-CM | POA: Insufficient documentation

## 2018-09-20 DIAGNOSIS — I442 Atrioventricular block, complete: Secondary | ICD-10-CM | POA: Diagnosis not present

## 2018-09-20 DIAGNOSIS — E119 Type 2 diabetes mellitus without complications: Secondary | ICD-10-CM | POA: Insufficient documentation

## 2018-09-20 DIAGNOSIS — J449 Chronic obstructive pulmonary disease, unspecified: Secondary | ICD-10-CM | POA: Insufficient documentation

## 2018-09-20 HISTORY — DX: Syncope and collapse: R55

## 2018-09-20 LAB — COMPREHENSIVE METABOLIC PANEL
ALT: 29 U/L (ref 0–44)
AST: 30 U/L (ref 15–41)
Albumin: 3.8 g/dL (ref 3.5–5.0)
Alkaline Phosphatase: 63 U/L (ref 38–126)
Anion gap: 12 (ref 5–15)
BUN: 13 mg/dL (ref 8–23)
CO2: 23 mmol/L (ref 22–32)
Calcium: 9 mg/dL (ref 8.9–10.3)
Chloride: 100 mmol/L (ref 98–111)
Creatinine, Ser: 0.86 mg/dL (ref 0.61–1.24)
GFR calc Af Amer: >60 mL/min (ref >60)
GFR calc non Af Amer: >60 mL/min (ref >60)
Glucose, Bld: 209 mg/dL — ABNORMAL HIGH (ref 70–99)
Potassium: 4.6 mmol/L (ref 3.5–5.1)
Sodium: 135 mmol/L (ref 135–145)
Total Bilirubin: 0.7 mg/dL (ref 0.3–1.2)
Total Protein: 6.2 g/dL — ABNORMAL LOW (ref 6.5–8.1)

## 2018-09-20 LAB — URINALYSIS, ROUTINE W REFLEX MICROSCOPIC
Bilirubin Urine: NEGATIVE
Glucose, UA: NEGATIVE mg/dL
Hgb urine dipstick: NEGATIVE
Ketones, ur: NEGATIVE mg/dL
Leukocytes,Ua: NEGATIVE
Nitrite: NEGATIVE
Protein, ur: NEGATIVE mg/dL
Specific Gravity, Urine: 1.008 (ref 1.005–1.030)
pH: 7 (ref 5.0–8.0)

## 2018-09-20 LAB — ECHOCARDIOGRAM COMPLETE: Height: 68 in

## 2018-09-20 LAB — CBC WITH DIFFERENTIAL/PLATELET
Abs Immature Granulocytes: 0.07 10*3/uL (ref 0.00–0.07)
Basophils Absolute: 0 10*3/uL (ref 0.0–0.1)
Basophils Relative: 1 %
Eosinophils Absolute: 0.1 10*3/uL (ref 0.0–0.5)
Eosinophils Relative: 2 %
HCT: 40.6 % (ref 39.0–52.0)
Hemoglobin: 13.2 g/dL (ref 13.0–17.0)
Immature Granulocytes: 1 %
Lymphocytes Relative: 18 %
Lymphs Abs: 1.2 10*3/uL (ref 0.7–4.0)
MCH: 28.8 pg (ref 26.0–34.0)
MCHC: 32.5 g/dL (ref 30.0–36.0)
MCV: 88.6 fL (ref 80.0–100.0)
Monocytes Absolute: 0.5 10*3/uL (ref 0.1–1.0)
Monocytes Relative: 8 %
Neutro Abs: 4.7 10*3/uL (ref 1.7–7.7)
Neutrophils Relative %: 70 %
Platelets: 210 10*3/uL (ref 150–400)
RBC: 4.58 MIL/uL (ref 4.22–5.81)
RDW: 12.2 % (ref 11.5–15.5)
WBC: 6.6 10*3/uL (ref 4.0–10.5)
nRBC: 0 % (ref 0.0–0.2)

## 2018-09-20 LAB — GLUCOSE, CAPILLARY
Glucose-Capillary: 184 mg/dL — ABNORMAL HIGH (ref 70–99)
Glucose-Capillary: 185 mg/dL — ABNORMAL HIGH (ref 70–99)

## 2018-09-20 LAB — TROPONIN I
Troponin I: <0.03 ng/mL (ref <0.03)
Troponin I: <0.03 ng/mL (ref <0.03)
Troponin I: <0.03 ng/mL (ref <0.03)

## 2018-09-20 LAB — PROTIME-INR
INR: 1.1 (ref 0.8–1.2)
Prothrombin Time: 13.9 seconds (ref 11.4–15.2)

## 2018-09-20 SURGERY — LEFT HEART CATH AND CORONARY ANGIOGRAPHY
Anesthesia: LOCAL

## 2018-09-20 MED ORDER — ASPIRIN EC 81 MG PO TBEC
81.0000 mg | DELAYED_RELEASE_TABLET | Freq: Every day | ORAL | Status: DC
  Administered 2018-09-20 – 2018-09-21 (×2): 81 mg via ORAL
  Filled 2018-09-20 (×2): qty 1

## 2018-09-20 MED ORDER — POTASSIUM CHLORIDE CRYS ER 10 MEQ PO TBCR
10.0000 meq | EXTENDED_RELEASE_TABLET | Freq: Every day | ORAL | Status: DC
  Administered 2018-09-21: 10 meq via ORAL
  Filled 2018-09-20 (×2): qty 1

## 2018-09-20 MED ORDER — SODIUM CHLORIDE 0.9 % IV SOLN
INTRAVENOUS | Status: DC
  Administered 2018-09-20 – 2018-09-21 (×2): via INTRAVENOUS

## 2018-09-20 MED ORDER — ZOLPIDEM TARTRATE 5 MG PO TABS
5.0000 mg | ORAL_TABLET | Freq: Every evening | ORAL | Status: DC | PRN
  Administered 2018-09-20: 5 mg via ORAL
  Filled 2018-09-20: qty 1

## 2018-09-20 MED ORDER — ONDANSETRON HCL 4 MG/2ML IJ SOLN
4.0000 mg | Freq: Once | INTRAMUSCULAR | Status: AC
  Administered 2018-09-20: 4 mg via INTRAVENOUS
  Filled 2018-09-20: qty 2

## 2018-09-20 MED ORDER — ROSUVASTATIN CALCIUM 20 MG PO TABS
40.0000 mg | ORAL_TABLET | Freq: Every day | ORAL | Status: DC
  Administered 2018-09-20 – 2018-09-21 (×2): 40 mg via ORAL
  Filled 2018-09-20 (×2): qty 2

## 2018-09-20 MED ORDER — MORPHINE SULFATE (PF) 4 MG/ML IV SOLN
4.0000 mg | Freq: Once | INTRAVENOUS | Status: AC
  Administered 2018-09-20: 4 mg via INTRAVENOUS
  Filled 2018-09-20: qty 1

## 2018-09-20 MED ORDER — CITALOPRAM HYDROBROMIDE 20 MG PO TABS
40.0000 mg | ORAL_TABLET | Freq: Every day | ORAL | Status: DC
  Administered 2018-09-21: 40 mg via ORAL
  Filled 2018-09-20 (×2): qty 2

## 2018-09-20 MED ORDER — OXYCODONE HCL 5 MG PO TABS
5.0000 mg | ORAL_TABLET | Freq: Three times a day (TID) | ORAL | Status: DC | PRN
  Administered 2018-09-20 – 2018-09-21 (×2): 5 mg via ORAL
  Filled 2018-09-20 (×2): qty 1

## 2018-09-20 MED ORDER — PERFLUTREN LIPID MICROSPHERE
1.0000 mL | INTRAVENOUS | Status: AC | PRN
  Administered 2018-09-20: 18:00:00 2 mL via INTRAVENOUS
  Filled 2018-09-20 (×2): qty 10

## 2018-09-20 MED ORDER — OXYCODONE-ACETAMINOPHEN 10-325 MG PO TABS: 1.0000 | ORAL_TABLET | Freq: Three times a day (TID) | ORAL | Status: DC | PRN

## 2018-09-20 MED ORDER — CARVEDILOL 12.5 MG PO TABS
12.5000 mg | ORAL_TABLET | Freq: Two times a day (BID) | ORAL | Status: DC
  Administered 2018-09-20 – 2018-09-21 (×2): 12.5 mg via ORAL
  Filled 2018-09-20 (×2): qty 1

## 2018-09-20 MED ORDER — VITAMIN D 25 MCG (1000 UNIT) PO TABS
1000.0000 [IU] | ORAL_TABLET | Freq: Every day | ORAL | Status: DC
  Administered 2018-09-20 – 2018-09-21 (×2): 1000 [IU] via ORAL
  Filled 2018-09-20 (×2): qty 1

## 2018-09-20 MED ORDER — FLUTICASONE PROPIONATE 50 MCG/ACT NA SUSP
2.0000 | Freq: Every day | NASAL | Status: DC
  Administered 2018-09-20: 22:00:00 2 via NASAL
  Filled 2018-09-20: qty 16

## 2018-09-20 MED ORDER — TRAMADOL HCL 50 MG PO TABS
50.0000 mg | ORAL_TABLET | Freq: Three times a day (TID) | ORAL | Status: DC | PRN
  Administered 2018-09-20 – 2018-09-21 (×2): 50 mg via ORAL
  Filled 2018-09-20 (×2): qty 1

## 2018-09-20 MED ORDER — SODIUM CHLORIDE 0.9 % IV BOLUS
1000.0000 mL | Freq: Once | INTRAVENOUS | Status: AC
  Administered 2018-09-20: 13:00:00 1000 mL via INTRAVENOUS

## 2018-09-20 MED ORDER — OXYCODONE-ACETAMINOPHEN 5-325 MG PO TABS
1.0000 | ORAL_TABLET | Freq: Three times a day (TID) | ORAL | Status: DC | PRN
  Administered 2018-09-20 – 2018-09-21 (×2): 1 via ORAL
  Filled 2018-09-20 (×2): qty 1

## 2018-09-20 MED ORDER — GADOBUTROL 1 MMOL/ML IV SOLN
10.0000 mL | Freq: Once | INTRAVENOUS | Status: AC | PRN
  Administered 2018-09-20: 10 mL via INTRAVENOUS

## 2018-09-20 MED ORDER — ENOXAPARIN SODIUM 40 MG/0.4ML ~~LOC~~ SOLN
40.0000 mg | SUBCUTANEOUS | Status: DC
  Administered 2018-09-20: 40 mg via SUBCUTANEOUS
  Filled 2018-09-20: qty 0.4

## 2018-09-20 MED ORDER — PANTOPRAZOLE SODIUM 40 MG PO TBEC
40.0000 mg | DELAYED_RELEASE_TABLET | Freq: Every day | ORAL | Status: DC
  Administered 2018-09-20 – 2018-09-21 (×2): 40 mg via ORAL
  Filled 2018-09-20 (×2): qty 1

## 2018-09-20 MED ORDER — IRBESARTAN 150 MG PO TABS
300.0000 mg | ORAL_TABLET | Freq: Every day | ORAL | Status: DC
  Administered 2018-09-21: 300 mg via ORAL
  Filled 2018-09-20: qty 2

## 2018-09-20 NOTE — Progress Notes (Signed)
  Echocardiogram 2D Echocardiogram has been performed.  Lee [name] G [name] 09/20/2018, 5:50 PM

## 2018-09-20 NOTE — H&P (Addendum)
Cardiology Admission History and Physical:   Patient ID: Lee [name] MRN: [id number]; DOB: 1950/09/03   Admission date: 09/20/2018  Primary Care Provider: Susy Frizzle, MD Primary Cardiologist: No primary care provider on file.  Primary Electrophysiologist:  None   Chief Complaint:  syncope  Patient Profile:   Lee [name] is a 68 y.o. male with hx of CAD and previous stenting of the LAD and RCA, who presents via EMS after a syncopal episode  History of Present Illness:   Lee [name] has a history of coronary artery disease with previous stenting of the LAD and RCA.  His last heart catheterization in 2015 demonstrated continued patency of both stent sites.  The patient was in his normal state of health this morning when he went out to do some yard work.  While he was out in the yard he became clammy and weak.  His wife was out working with him and she heard him moaning.  When she looked over he was lying face down on the ground.  She was not able to turn him over to his back, but she was aware that he was breathing spontaneously and continuing to moan.  He then began to regain his consciousness and was noted to have some confusion.  He tried to get up but he was too weak to get to his feet.  At that point she decided to call EMS.  Upon EMS arrival, an EKG initially showed sinus bradycardia with right bundle branch block pattern which is unchanged from his baseline EKG.  However, the patient then developed worsening diaphoresis and bradycardia with evidence of complete heart block on EKG.  At that point a code STEMI was called.  Fortunately the patient responded to IV fluid resuscitation and when he was in route, the code STEMI was canceled because his EKG normalized.  At the time of his arrival in the emergency department, the patient is hemodynamically stable and normal sinus rhythm with no significant ST/T wave changes on his EKG.  He complains of 2/10 chest pain.  He denies  nausea, vomiting, or shortness of breath.  He denies any history of syncope.  He has not had recent chest pain at rest or with physical exertion.  His pain is located in the substernal area and left lower chest area and he describes this as a soreness.  There is no pleuritic component.   Past Medical History:  Diagnosis Date  . Allergic rhinitis    chronic sinusitis  . Arthritis   . CAD (coronary artery disease)    a. 2009 PCI/DES to mLAD; b. 05/2010 s/p PCI RCA and LAD.; c. 05/2013 Cath: LAD mild ISR, LCX nl, RCA 40p, patent stent.  . Cataract    has had lasik surg  . COPD (chronic obstructive pulmonary disease) ([place])   . Diabetes mellitus type 2, controlled, without complications ([place])   . Diverticulosis   . DJD (degenerative joint disease)   . Esophagitis 1991   grade 1  . GERD (gastroesophageal reflux disease)    Hiatal hernia  . Hemorrhoids   . Hyperlipidemia   . Hypertensive heart disease   . Iron deficiency anemia   . Migraines   . Paroxysmal atrial fibrillation ([facility])    a. s/p afib ablation 10/22/08 ([facility]).  . PVC's (premature ventricular contractions)   . Sleep apnea    Questionalble: RDI during the total sleep time 6h 28 mins was 3.55/hr during REM sleep was at 5.71/hr. Supine AHI  was 5.93/hr.    Past Surgical History:  Procedure Laterality Date  . CARDIAC CATHETERIZATION  05/2010   The proximal LAD was then predilated with 2.0 x 12 trek. this was then stented with a 2.5 x 16 promus Element drug-eluting stent at 14 atmosphere and prostdilated with 2.75 x 12 [place] trek at 16 atmospheres(2.8 mm) resulting the reduction of 80% proximal LAD stenosis to 0% residual with excellent flow.  Marland Kitchen CARDIAC CATHETERIZATION  06/05/2010   Successful percutaneous coronary intervention to the right coronary artery with percutaneous transluminal coronary angioplasty/stenting and insertion 3.0 x 16 mm Promus DES post dilated to 3.35 mm with stenosis being reduced to 0%  . CARDIAC  CATHETERIZATION  02/2008   Post dilatation was performed using a 2.75 x 9 [place] sprinter, 10 atmospheres for 40 seconds and then 9 atmosphere for 35 sec. This resulted in the 80% area of narrowing pre-intervention, now appearing to normal. There was no edvidence of the dissection or thrombus and there was TIMI III flow pre and post.  . CARDIAC CATHETERIZATION  02/2006  . CORONARY ANGIOPLASTY WITH STENT PLACEMENT     3 stents/ 4 caths  . EYE SURGERY    . KNEE ARTHROSCOPY     right  . LASIK    . LEFT HEART CATHETERIZATION WITH CORONARY ANGIOGRAM N/A 06/29/2013   Procedure: LEFT HEART CATHETERIZATION WITH CORONARY ANGIOGRAM;  Surgeon: Leonie Man, MD;  Location: [facility] CATH LAB;  Service: Cardiovascular;  Laterality: N/A;  . NASAL SINUS SURGERY  2001  . NECK SURGERY     Cervical fusion  . RADIOFREQUENCY ABLATION  May 2010   atrial fibrillation  . ROTATOR CUFF REPAIR     left  . SHOULDER OPEN ROTATOR CUFF REPAIR     right  . SPINE SURGERY    . WRIST ARTHROCENTESIS     left     Medications Prior to Admission: Prior to Admission medications   Medication Sig Start Date End Date Taking? Authorizing Provider  aspirin 81 MG tablet Take 81 mg by mouth daily.    [provider]  baclofen (LIORESAL) 10 MG tablet  08/29/17   [provider]  CARTIA XT 120 MG 24 hr capsule TAKE 1 CAPSULE(120 MG) BY MOUTH AT BEDTIME 01/25/18   Troy Sine, MD  carvedilol (COREG) 12.5 MG tablet TAKE 2 TABLETS BY MOUTH 2  TIMES DAILY WITH A MEAL 08/23/18   Susy Frizzle, MD  cholecalciferol (VITAMIN D) 1000 UNITS tablet Take 1,000 Units by mouth daily.    [provider]  citalopram (CELEXA) 40 MG tablet TAKE 1 TABLET(40 MG) BY MOUTH DAILY 10/10/17   Susy Frizzle, MD  Coenzyme Q10 (CO Q 10) 100 MG CAPS Take 1 capsule by mouth daily.    [provider]  fexofenadine (ALLEGRA) 180 MG tablet Take by mouth.    [provider]  fluticasone (FLONASE) 50 MCG/ACT nasal spray  INSTILL 2 SPRAYS IN THE  NOSE AT BEDTIME 04/06/17   Susy Frizzle, MD  furosemide (LASIX) 40 MG tablet TAKE 1 TABLET BY MOUTH  DAILY 04/14/18   Troy Sine, MD  Galcanezumab-gnlm [facility]) 120 MG/ML SOAJ Inject into the skin every 30 (thirty) days.  08/29/17   [provider]  nitroGLYCERIN (NITROSTAT) 0.4 MG SL tablet PLACE ONE TABLET UNDER THE TONGUE EVERY 5 MINUTES AS NEEDED FOR CHEST PAIN 11/22/16   Troy Sine, MD  oxyCODONE-acetaminophen (PERCOCET) 10-325 MG tablet Take 1 tablet by mouth every  8 (eight) hours as needed for pain. 07/27/18   Susy Frizzle, MD  pantoprazole (PROTONIX) 40 MG tablet TAKE 1 TABLET BY MOUTH  EVERY DAY 04/14/18   Troy Sine, MD  potassium chloride (MICRO-K) 10 MEQ CR capsule TAKE 1 CAPSULE BY MOUTH  EVERY DAY 04/14/18   Troy Sine, MD  rosuvastatin (CRESTOR) 40 MG tablet TAKE 1 TABLET BY MOUTH  DAILY 06/29/18   Troy Sine, MD  telmisartan (MICARDIS) 40 MG tablet Take 1 tablet (40 mg total) by mouth daily. 07/10/18   Troy Sine, MD  traMADol (ULTRAM) 50 MG tablet TAKE 1 TABLET(50 MG) BY MOUTH EVERY 8 HOURS AS NEEDED 08/25/18   Susy Frizzle, MD  UNABLE TO FIND CPAP therapy    [provider]  zolpidem (AMBIEN) 10 MG tablet TAKE 1 TABLET(10 MG) BY MOUTH AT BEDTIME AS NEEDED FOR SLEEP 03/07/18   Susy Frizzle, MD     Allergies:    Allergies  Allergen Reactions  . Ibuprofen Swelling  . Other Shortness Of Breath    BETA BLOCKERS  . Topamax [Topiramate] Other (See Comments)    Pt states it made his tongue feel "scalded" and he couldn't eat   . Toprol Xl [Metoprolol Succinate] Other (See Comments)    Personality change  . Metoprolol-Hctz Er Other (See Comments)    Indigestion,mouth raw    Social History:   Social History   Socioeconomic History  . Marital status: Married    Spouse name: Not on file  . Number of children: 2  . Years of education: Not on file  . Highest education level: Not on file   Occupational History  . Occupation: retired  Scientific laboratory technician  . Financial resource strain: Not on file  . Food insecurity:    Worry: Not on file    Inability: Not on file  . Transportation needs:    Medical: Not on file    Non-medical: Not on file  Tobacco Use  . Smoking status: Former Smoker    Last attempt to quit: 06/01/1987    Years since quitting: 31.3  . Smokeless tobacco: Never Used  Substance and Sexual Activity  . Alcohol use: Yes    Alcohol/week: 0.0 standard drinks    Comment: once every 6-7 months  . Drug use: No  . Sexual activity: Not on file  Lifestyle  . Physical activity:    Days per week: Not on file    Minutes per session: Not on file  . Stress: Not on file  Relationships  . Social connections:    Talks on phone: Not on file    Gets together: Not on file    Attends religious service: Not on file    Active member of club or organization: Not on file    Attends meetings of clubs or organizations: Not on file    Relationship status: Not on file  . Intimate partner violence:    Fear of current or ex partner: Not on file    Emotionally abused: Not on file    Physically abused: Not on file    Forced sexual activity: Not on file  Other Topics Concern  . Not on file  Social History Narrative  . Not on file    Family History:   The patient's family history includes Colon cancer in his mother; Diabetes in his father; Heart disease in his father and sister; Uterine cancer in his mother.    ROS:  Please  see the history of present illness.  All other ROS reviewed and negative.     Physical Exam/Data:   Vitals:   09/20/18 1247  BP: (!) 147/90  Pulse: 73  Resp: 14  Temp: 98 F (36.7 C)  TempSrc: Oral  SpO2: 100%  Height: 5\' 8"  (1.727 m)   No intake or output data in the 24 hours ending 09/20/18 1316 Last 3 Weights 03/28/2018 01/10/2018 12/19/2017  Weight (lbs) 271 lb 269 lb 267 lb 12.8 oz  Weight (kg) 122.925 kg 122.018 kg 121.473 kg     Body mass  index is 41.21 kg/m.  General: Morbidly obese male in no acute distress HEENT: normal Lymph: no adenopathy Neck: no JVD Endocrine:  No thryomegaly Vascular: No carotid bruits; FA pulses 2+ bilaterally  Cardiac:  normal S1, S2; RRR; no murmur  Lungs:  clear to auscultation bilaterally, no wheezing, rhonchi or rales  Chest: tender to ;palpation Abd: soft, nontender, no hepatomegaly  Ext: no edema Musculoskeletal:  No deformities, BUE and BLE strength normal and equal Skin: warm and dry  Neuro:  CNs 2-12 intact, no focal abnormalities noted Psych:  Normal affect    EKG:  The ECG that was done today was personally reviewed and demonstrates sinus bradycardia with right bundle branch block  Relevant CV Studies: Echocardiogram 03/04/2014: Study Conclusions  - Left ventricle: The cavity size was normal. Wall thickness was increased in a pattern of mild LVH. Systolic function was normal. The estimated ejection fraction was in the range of 55% to 60%. Doppler parameters are consistent with abnormal left ventricular relaxation (grade 1 diastolic dysfunction). - Left atrium: The atrium was mildly dilated.  Nuclear stress test 09/23/2015: Study Highlights    The left ventricular ejection fraction is normal (55-65%).  Nuclear stress EF: 62%.  There was no ST segment deviation noted during stress.  The study is normal.   Normal stress nuclear study with no ischemia or infarction; EF 62 with normal wall motion.   Laboratory Data:  ChemistryNo results for input(s): NA, K, CL, CO2, GLUCOSE, BUN, CREATININE, CALCIUM, GFRNONAA, GFRAA, ANIONGAP in the last 168 hours.  No results for input(s): PROT, ALBUMIN, AST, ALT, ALKPHOS, BILITOT in the last 168 hours. HematologyNo results for input(s): WBC, RBC, HGB, HCT, MCV, MCH, MCHC, RDW, PLT in the last 168 hours. Cardiac EnzymesNo results for input(s): TROPONINI in the last 168 hours. No results for input(s): TROPIPOC in the last 168  hours.  BNPNo results for input(s): BNP, PROBNP in the last 168 hours.  DDimer No results for input(s): DDIMER in the last 168 hours.  Radiology/Studies:  Dg Chest Port 1 View  Result Date: 09/20/2018 CLINICAL DATA:  Syncope. EXAM: PORTABLE CHEST 1 VIEW COMPARISON:  Radiographs of February 09, 2016. FINDINGS: The heart size and mediastinal contours are within normal limits. Both lungs are clear. The visualized skeletal structures are unremarkable. IMPRESSION: No active disease. Electronically Signed   By: Marijo Conception M.D.   On: 09/20/2018 12:55    Assessment and Plan:   1. Syncope: Differential diagnosis includes arrhythmia, ischemia, and vasodepressor event as most likely etiologies.  Considering his prodrome of weakness and cold clammy sweat, I think a vasovagal episode is most likely.  However, will monitor on telemetry overnight and we will cycle cardiac enzymes.  We will check an echocardiogram to evaluate for LV and RV function. 2. Coronary artery disease with angina: The patient has resting chest discomfort on arrival.  He does have chest wall tenderness  on exam and is possible his chest pain is related to chest wall trauma from his fall.  Also could be resting ischemia, but his EKG does not show acute changes.  We will cycle enzymes.  Will check a chest x-ray. 3. Type 2 diabetes: Check blood glucose.  Cover with sliding scale insulin while here. 4. Paroxysmal atrial fibrillation: The patient is status post A. fib ablation.  It does not appear that he is been on chronic anticoagulation.  He has had no recurrence of atrial fibrillation since his ablation in 2012.  5. Complete heart block: Seen on EMS rhythm strips.  As above, could be a vagally mediated event versus heart block as an etiology of his syncopal spell.  Monitor overnight on telemetry.  Hold diltiazem.  Continue beta-blocker.  Consider outpatient monitoring at discharge.  Disposition: Observation status, cycle cardiac  enzymes, monitor on telemetry, check 2D echocardiogram.  If all testing negative, consider discharge tomorrow with a ZIO Patch at discharge  For questions or updates, please contact [place] Please consult [email or web address] for contact info under        Signed, Sherren Mocha, MD  09/20/2018 1:16 PM

## 2018-09-20 NOTE — ED Provider Notes (Signed)
[facility] EMERGENCY DEPARTMENT Provider Note   CSN: [id number] Arrival date & time: 09/20/18  1236    History   Chief Complaint Chief Complaint  Patient presents with  . Loss of Consciousness    HPI Lee [name] is a 68 y.o. male.     Pt presents to the ED today with a syncopal episode.  The pt said he was doing work outside in the yard and fell to the ground.  When EMS arrived, his BP was low with no radial pulses.  Initial EKG looked like ST elevation in the inferior leads, so a code STEMI was called.  The pt was given 700 cc NS en route and bp improved.  En route, he did develop a brief HB, but that resolved.  Code STEMI cancelled b/c EKG resolved itself.  Dr. Burt Knack (cards) met pt in ED when he arrived.  Pt denies feeling anything in particular before he passed out.  He c/o cp, but it is worse with movement.  He was incontinent of urine.     Past Medical History:  Diagnosis Date  . Allergic rhinitis    chronic sinusitis  . Arthritis   . CAD (coronary artery disease)    a. 2009 PCI/DES to mLAD; b. 05/2010 s/p PCI RCA and LAD.; c. 05/2013 Cath: LAD mild ISR, LCX nl, RCA 40p, patent stent.  . Cataract    has had lasik surg  . COPD (chronic obstructive pulmonary disease) ([place])   . Diabetes mellitus type 2, controlled, without complications ([place])   . Diverticulosis   . DJD (degenerative joint disease)   . Esophagitis 1991   grade 1  . GERD (gastroesophageal reflux disease)    Hiatal hernia  . Hemorrhoids   . Hyperlipidemia   . Hypertensive heart disease   . Iron deficiency anemia   . Migraines   . Paroxysmal atrial fibrillation (HCC)    a. s/p afib ablation 10/22/08 ([facility]).  . PVC's (premature ventricular contractions)   . Sleep apnea    Questionalble: RDI during the total sleep time 6h 28 mins was 3.55/hr during REM sleep was at 5.71/hr. Supine AHI was 5.93/hr.    Patient Active Problem List   Diagnosis Date Noted  . Syncope  09/20/2018  . Neck mass 10/14/2016  . Depression 05/20/2016  . Hypertensive heart disease   . Paroxysmal atrial fibrillation (HCC)   . Hyperlipidemia LDL goal <70 03/27/2014  . Premature ventricular contraction 03/04/2014  . Chest pain with high risk of acute coronary syndrome 06/28/2013  . Morbid obesity ([place]) 06/28/2013  . OSA (obstructive sleep apnea) 12/18/2012  . Sleepiness 11/06/2012  . Fatigue 11/06/2012  . PALPITATIONS 06/02/2009  . ATRIAL FIBRILLATION 10/16/2008  . DYSLIPIDEMIA 09/05/2008  . Essential hypertension 09/05/2008  . CAD S/P percutaneous coronary angioplasty 09/05/2008  . RBBB 09/05/2008  . SUPRAVENTRICULAR TACHYCARDIA 09/05/2008  . ALLERGIC RHINITIS 09/05/2008  . GERD 09/05/2008  . BACK PAIN 09/05/2008  . ARRHYTHMIA, HX OF 09/05/2008  . MIGRAINES, HX OF 09/05/2008  . Dyslipidemia 09/05/2008    Past Surgical History:  Procedure Laterality Date  . CARDIAC CATHETERIZATION  05/2010   The proximal LAD was then predilated with 2.0 x 12 trek. this was then stented with a 2.5 x 16 promus Element drug-eluting stent at 14 atmosphere and prostdilated with 2.75 x 12 [place] trek at 16 atmospheres(2.8 mm) resulting the reduction of 80% proximal LAD stenosis to 0% residual with excellent flow.  Marland Kitchen CARDIAC CATHETERIZATION  06/05/2010  Successful percutaneous coronary intervention to the right coronary artery with percutaneous transluminal coronary angioplasty/stenting and insertion 3.0 x 16 mm Promus DES post dilated to 3.35 mm with stenosis being reduced to 0%  . CARDIAC CATHETERIZATION  02/2008   Post dilatation was performed using a 2.75 x 9 [place] sprinter, 10 atmospheres for 40 seconds and then 9 atmosphere for 35 sec. This resulted in the 80% area of narrowing pre-intervention, now appearing to normal. There was no edvidence of the dissection or thrombus and there was TIMI III flow pre and post.  . CARDIAC CATHETERIZATION  02/2006  . CORONARY ANGIOPLASTY WITH STENT PLACEMENT      3 stents/ 4 caths  . EYE SURGERY    . KNEE ARTHROSCOPY     right  . LASIK    . LEFT HEART CATHETERIZATION WITH CORONARY ANGIOGRAM N/A 06/29/2013   Procedure: LEFT HEART CATHETERIZATION WITH CORONARY ANGIOGRAM;  Surgeon: Leonie Man, MD;  Location: [facility] CATH LAB;  Service: Cardiovascular;  Laterality: N/A;  . NASAL SINUS SURGERY  2001  . NECK SURGERY     Cervical fusion  . RADIOFREQUENCY ABLATION  May 2010   atrial fibrillation  . ROTATOR CUFF REPAIR     left  . SHOULDER OPEN ROTATOR CUFF REPAIR     right  . SPINE SURGERY    . WRIST ARTHROCENTESIS     left        Home Medications    Prior to Admission medications   Medication Sig Start Date End Date Taking? Authorizing Provider  aspirin 81 MG tablet Take 81 mg by mouth daily.   Yes [provider]  CARTIA XT 120 MG 24 hr capsule TAKE 1 CAPSULE(120 MG) BY MOUTH AT BEDTIME Patient taking differently: Take 120 mg by mouth daily.  01/25/18  Yes Troy Sine, MD  carvedilol (COREG) 12.5 MG tablet TAKE 2 TABLETS BY MOUTH 2  TIMES DAILY WITH A MEAL Patient taking differently: Take 25 mg by mouth 2 (two) times daily with a meal.  08/23/18  Yes Susy Frizzle, MD  cholecalciferol (VITAMIN D) 1000 UNITS tablet Take 1,000 Units by mouth daily.   Yes [provider]  citalopram (CELEXA) 40 MG tablet TAKE 1 TABLET(40 MG) BY MOUTH DAILY Patient taking differently: Take 40 mg by mouth daily.  10/10/17  Yes Susy Frizzle, MD  Coenzyme Q10 (CO Q 10) 100 MG CAPS Take 1 capsule by mouth daily.   Yes [provider]  fexofenadine (ALLEGRA) 180 MG tablet Take by mouth.   Yes [provider]  fluticasone (FLONASE) 50 MCG/ACT nasal spray INSTILL 2 SPRAYS IN THE  NOSE AT BEDTIME Patient taking differently: Place 2 sprays into both nostrils at bedtime. INSTILL 2 SPRAYS IN THE NOSE AT BEDTIME 04/06/17  Yes Susy Frizzle, MD  furosemide (LASIX) 40 MG tablet TAKE 1 TABLET BY MOUTH  DAILY Patient taking  differently: Take 40 mg by mouth daily.  04/14/18  Yes Troy Sine, MD  AIMOVIG 140 MG/ML SOAJ Inject 140 mg into the muscle every 30 (thirty) days. 08/21/18   [provider]  baclofen (LIORESAL) 10 MG tablet  08/29/17   [provider]  Galcanezumab-gnlm (EMGALITY) 120 MG/ML SOAJ Inject into the skin every 30 (thirty) days.  08/29/17   [provider]  nitroGLYCERIN (NITROSTAT) 0.4 MG SL tablet PLACE ONE TABLET UNDER THE TONGUE EVERY 5 MINUTES AS NEEDED FOR CHEST PAIN Patient taking differently: Place 0.4 mg under the tongue every  5 (five) minutes as needed for chest pain.  11/22/16   Troy Sine, MD  oxyCODONE-acetaminophen (PERCOCET) 10-325 MG tablet Take 1 tablet by mouth every 8 (eight) hours as needed for pain. 07/27/18   Susy Frizzle, MD  pantoprazole (PROTONIX) 40 MG tablet TAKE 1 TABLET BY MOUTH  EVERY DAY Patient taking differently: Take 40 mg by mouth daily.  04/14/18   Troy Sine, MD  potassium chloride (MICRO-K) 10 MEQ CR capsule TAKE 1 CAPSULE BY MOUTH  EVERY DAY Patient taking differently: Take 10 mEq by mouth daily.  04/14/18   Troy Sine, MD  rosuvastatin (CRESTOR) 40 MG tablet TAKE 1 TABLET BY MOUTH  DAILY Patient taking differently: Take 40 mg by mouth daily.  06/29/18   Troy Sine, MD  telmisartan (MICARDIS) 40 MG tablet Take 1 tablet (40 mg total) by mouth daily. 07/10/18   Troy Sine, MD  traMADol (ULTRAM) 50 MG tablet TAKE 1 TABLET(50 MG) BY MOUTH EVERY 8 HOURS AS NEEDED Patient taking differently: Take 50 mg by mouth every 8 (eight) hours as needed for moderate pain.  08/25/18   Susy Frizzle, MD  zolpidem (AMBIEN) 10 MG tablet TAKE 1 TABLET(10 MG) BY MOUTH AT BEDTIME AS NEEDED FOR SLEEP Patient taking differently: Take 10 mg by mouth at bedtime as needed for sleep.  03/07/18   Susy Frizzle, MD    Family History Family History  Problem Relation Age of Onset  . Heart disease Father        and sister  . Diabetes  Father   . Colon cancer Mother        mets from uterine  . Uterine cancer Mother   . Heart disease Sister     Social History Social History   Tobacco Use  . Smoking status: Former Smoker    Last attempt to quit: 06/01/1987    Years since quitting: 31.3  . Smokeless tobacco: Never Used  Substance Use Topics  . Alcohol use: Yes    Alcohol/week: 0.0 standard drinks    Comment: once every 6-7 months  . Drug use: No     Allergies   Ibuprofen; Other; Topamax [topiramate]; Toprol xl [metoprolol succinate]; and Metoprolol-hctz er   Review of Systems Review of Systems  Neurological: Positive for syncope.  All other systems reviewed and are negative.    Physical Exam Updated Vital Signs BP (!) 149/88   Pulse 75   Temp 98 F (36.7 C) (Oral)   Resp 20   Ht _0  (1.727 m)   SpO2 95%   BMI 41.21 kg/m   Physical Exam Constitutional:      Appearance: Normal appearance. He is obese.  HENT:     Head: Normocephalic.     Comments: Small, scattered abrasions to left face    Right Ear: External ear normal.     Left Ear: External ear normal.     Nose: Nose normal.     Mouth/Throat:     Mouth: Mucous membranes are dry.  Eyes:     Extraocular Movements: Extraocular movements intact.     Conjunctiva/sclera: Conjunctivae normal.     Pupils: Pupils are equal, round, and reactive to light.  Neck:     Musculoskeletal: Normal range of motion and neck supple.  Cardiovascular:     Rate and Rhythm: Normal rate and regular rhythm.     Pulses: Normal pulses.     Heart sounds: Normal heart sounds.  Pulmonary:  Effort: Pulmonary effort is normal.     Breath sounds: Normal breath sounds.  Chest:     Comments: Abrasions to left chest.  Tenderness to palpation. Abdominal:     General: Abdomen is flat. Bowel sounds are normal.     Palpations: Abdomen is soft.  Musculoskeletal: Normal range of motion.  Skin:    General: Skin is warm.     Capillary Refill: Capillary refill takes  less than 2 seconds.  Neurological:     General: No focal deficit present.     Mental Status: He is alert and oriented to person, place, and time.  Psychiatric:        Mood and Affect: Mood normal.        Behavior: Behavior normal.      ED Treatments / Results  Labs (all labs ordered are listed, but only abnormal results are displayed) Labs Reviewed  COMPREHENSIVE METABOLIC PANEL - Abnormal; Notable for the following components:      Result Value   Glucose, Bld 209 (*)    Total Protein 6.2 (*)    All other components within normal limits  GLUCOSE, CAPILLARY - Abnormal; Notable for the following components:   Glucose-Capillary 184 (*)    All other components within normal limits  CBC WITH DIFFERENTIAL/PLATELET  PROTIME-INR  URINALYSIS, ROUTINE W REFLEX MICROSCOPIC  TROPONIN I  HIV ANTIBODY (ROUTINE TESTING W REFLEX)  TROPONIN I  TROPONIN I  TROPONIN I    EKG EKG Interpretation  Date/Time:  Wednesday September 20 2018 12:37:47 EDT Ventricular Rate:  74 PR Interval:    QRS Duration: 136 QT Interval:  426 QTC Calculation: 473 R Axis:   69 Text Interpretation:  Sinus rhythm Prolonged PR interval Right bundle branch block ST elevation, consider inferior injury No significant change since last tracing Confirmed by Isla Pence [phone]) on 09/20/2018 1:15:10 PM   Radiology Ct Head Wo Contrast  Result Date: 09/20/2018 CLINICAL DATA:  Headache and syncope EXAM: CT HEAD WITHOUT CONTRAST TECHNIQUE: Contiguous axial images were obtained from the base of the skull through the vertex without intravenous contrast. COMPARISON:  Brain MRI June 06, 2013 FINDINGS: Brain: The ventricles are normal in size and configuration. There is no intracranial mass, hemorrhage, extra-axial fluid collection, or midline shift. There is focal decreased attenuation in the midline of the medulla, a finding that potentially may represent a recent focal infarct in this area. Elsewhere brain parenchyma  appears unremarkable. Vascular: There is no appreciable hyperdense vessel. There are foci of calcification in each carotid siphon region. Skull: The bony calvarium appears intact. Sinuses/Orbits: There is mucosal thickening in several ethmoid air cells. Other paranasal sinuses are clear. Orbits appear symmetric bilaterally. Other: Mastoid air cells are clear. IMPRESSION: 1. Decreased attenuation in the midline of the inferior medulla. A focal infarct which may be recent is of concern given this appearance. Elsewhere brain parenchyma appears unremarkable. No mass or hemorrhage. 2.  There are foci of arterial vascular calcification. 3.  Mucosal thickening noted in several ethmoid air cells. Electronically Signed   By: Lowella Grip III M.D.   On: 09/20/2018 13:26   Dg Chest Port 1 View  Result Date: 09/20/2018 CLINICAL DATA:  Syncope. EXAM: PORTABLE CHEST 1 VIEW COMPARISON:  Radiographs of February 09, 2016. FINDINGS: The heart size and mediastinal contours are within normal limits. Both lungs are clear. The visualized skeletal structures are unremarkable. IMPRESSION: No active disease. Electronically Signed   By: Marijo Conception M.D.   On: 09/20/2018  12:55    Procedures Procedures (including critical care time)  Medications Ordered in ED Medications  sodium chloride 0.9 % bolus 1,000 mL (0 mLs Intravenous Stopped 09/20/18 1448)    And  0.9 %  sodium chloride infusion ( Intravenous New Bag/Given 09/20/18 1451)  aspirin tablet 81 mg (has no administration in time range)  carvedilol (COREG) tablet 12.5 mg (has no administration in time range)  cholecalciferol (VITAMIN D) tablet 1,000 Units (has no administration in time range)  citalopram (CELEXA) tablet 40 mg (has no administration in time range)  fluticasone (FLONASE) 50 MCG/ACT nasal spray 2 spray (has no administration in time range)  oxyCODONE-acetaminophen (PERCOCET) 10-325 MG per tablet 1 tablet (has no administration in time range)   pantoprazole (PROTONIX) EC tablet 40 mg (has no administration in time range)  potassium chloride (K-DUR) CR tablet 10 mEq (has no administration in time range)  rosuvastatin (CRESTOR) tablet 40 mg (has no administration in time range)  irbesartan (AVAPRO) tablet 300 mg (has no administration in time range)  traMADol (ULTRAM) tablet 50 mg (has no administration in time range)  zolpidem (AMBIEN) tablet 10 mg (has no administration in time range)  enoxaparin (LOVENOX) injection 40 mg (has no administration in time range)  morphine 4 MG/ML injection 4 mg (4 mg Intravenous Given 09/20/18 1448)  ondansetron (ZOFRAN) injection 4 mg (4 mg Intravenous Given 09/20/18 1448)     Initial Impression / Assessment and Plan / ED Course  I have reviewed the triage vital signs and the nursing notes.  Pertinent labs & imaging results that were available during my care of the patient were reviewed by me and considered in my medical decision making (see chart for details).       Pt d/w Dr. Burt Knack who will admit and observe.  His CT head was abnormal, so a MRI was ordered.  BP and vitals good in ED.  Labs wnl.      CRITICAL CARE Performed by: Isla Pence   Total critical care time: 30 minutes  Critical care time was exclusive of separately billable procedures and treating other patients.  Critical care was necessary to treat or prevent imminent or life-threatening deterioration.  Critical care was time spent personally by me on the following activities: development of treatment plan with patient and/or surrogate as well as nursing, discussions with consultants, evaluation of patient's response to treatment, examination of patient, obtaining history from patient or surrogate, ordering and performing treatments and interventions, ordering and review of laboratory studies, ordering and review of radiographic studies, pulse oximetry and re-evaluation of patient's condition.  Final Clinical Impressions(s)  / ED Diagnoses   Final diagnoses:  Syncope, unspecified syncope type    ED Discharge Orders    None       Isla Pence, MD 09/20/18 1635

## 2018-09-20 NOTE — ED Notes (Signed)
ED TO INPATIENT HANDOFF REPORT  ED Nurse Name and Phone #:  Hassan Rowan [phone]  S Name/Age/Gender Lee [name] 68 y.o. male Room/Bed: TRAAC/TRAAC  Code Status   Code Status: Not on file  Home/SNF/Other Home Patient oriented to: self, place, time and situation Is this baseline? Yes   Triage Complete: Triage complete  Chief Complaint STEMI  Triage Note Pt here from home via [facility].  Pt was doing yard work and his wife saw him fall to the ground.  Initial pressures in 90's with possible complete HB and no radial pulses.  Given 700 ns and 324 asa en-route.  EKG changed to ns.  Pt ao x 4 presently.  Chest pain 2/10.   Allergies Allergies  Allergen Reactions  . Ibuprofen Swelling  . Other Shortness Of Breath    BETA BLOCKERS  . Topamax [Topiramate] Other (See Comments)    Pt states it made his tongue feel "scalded" and he couldn't eat   . Toprol Xl [Metoprolol Succinate] Other (See Comments)    Personality change  . Metoprolol-Hctz Er Other (See Comments)    Indigestion,mouth raw    Level of Care/Admitting Diagnosis ED Disposition    ED Disposition Condition [facility] Area: [place] [[id number]]  Level of Care: Telemetry Cardiac [103]  Covid Evaluation: N/A  Diagnosis: Syncope [332951]  Admitting Physician: [place], Hardy  Attending Physician: Burt Knack, MICHAEL [[id number]]  PT Class (Do Not Modify): Observation [104]  PT Acc Code (Do Not Modify): Observation [10022]       B Medical/Surgery History Past Medical History:  Diagnosis Date  . Allergic rhinitis    chronic sinusitis  . Arthritis   . CAD (coronary artery disease)    a. 2009 PCI/DES to mLAD; b. 05/2010 s/p PCI RCA and LAD.; c. 05/2013 Cath: LAD mild ISR, LCX nl, RCA 40p, patent stent.  . Cataract    has had lasik surg  . COPD (chronic obstructive pulmonary disease) ([place])   . Diabetes mellitus type 2, controlled, without complications ([place])   . Diverticulosis   . DJD  (degenerative joint disease)   . Esophagitis 1991   grade 1  . GERD (gastroesophageal reflux disease)    Hiatal hernia  . Hemorrhoids   . Hyperlipidemia   . Hypertensive heart disease   . Iron deficiency anemia   . Migraines   . Paroxysmal atrial fibrillation ([facility])    a. s/p afib ablation 10/22/08 ([facility]).  . PVC's (premature ventricular contractions)   . Sleep apnea    Questionalble: RDI during the total sleep time 6h 28 mins was 3.55/hr during REM sleep was at 5.71/hr. Supine AHI was 5.93/hr.   Past Surgical History:  Procedure Laterality Date  . CARDIAC CATHETERIZATION  05/2010   The proximal LAD was then predilated with 2.0 x 12 trek. this was then stented with a 2.5 x 16 promus Element drug-eluting stent at 14 atmosphere and prostdilated with 2.75 x 12 [place] trek at 16 atmospheres(2.8 mm) resulting the reduction of 80% proximal LAD stenosis to 0% residual with excellent flow.  Marland Kitchen CARDIAC CATHETERIZATION  06/05/2010   Successful percutaneous coronary intervention to the right coronary artery with percutaneous transluminal coronary angioplasty/stenting and insertion 3.0 x 16 mm Promus DES post dilated to 3.35 mm with stenosis being reduced to 0%  . CARDIAC CATHETERIZATION  02/2008   Post dilatation was performed using a 2.75 x 9 [place] sprinter, 10 atmospheres for 40 seconds and then 9 atmosphere  for 35 sec. This resulted in the 80% area of narrowing pre-intervention, now appearing to normal. There was no edvidence of the dissection or thrombus and there was TIMI III flow pre and post.  . CARDIAC CATHETERIZATION  02/2006  . CORONARY ANGIOPLASTY WITH STENT PLACEMENT     3 stents/ 4 caths  . EYE SURGERY    . KNEE ARTHROSCOPY     right  . LASIK    . LEFT HEART CATHETERIZATION WITH CORONARY ANGIOGRAM N/A 06/29/2013   Procedure: LEFT HEART CATHETERIZATION WITH CORONARY ANGIOGRAM;  Surgeon: Leonie Man, MD;  Location: [facility] CATH LAB;  Service: Cardiovascular;  Laterality: N/A;  . NASAL SINUS  SURGERY  2001  . NECK SURGERY     Cervical fusion  . RADIOFREQUENCY ABLATION  May 2010   atrial fibrillation  . ROTATOR CUFF REPAIR     left  . SHOULDER OPEN ROTATOR CUFF REPAIR     right  . SPINE SURGERY    . WRIST ARTHROCENTESIS     left     A IV Location/Drains/Wounds Patient Lines/Drains/Airways Status   Active Line/Drains/Airways    Name:   Placement date:   Placement time:   Site:   Days:   Peripheral IV 09/20/18 Left Antecubital   09/20/18    -    Antecubital   less than 1          Intake/Output Last 24 hours  Intake/Output Summary (Last 24 hours) at 09/20/2018 1457 Last data filed at 09/20/2018 1448 Gross per 24 hour  Intake 1000 ml  Output -  Net 1000 ml    Labs/Imaging Results for orders placed or performed during the hospital encounter of 09/20/18 (from the past 48 hour(s))  CBC WITH DIFFERENTIAL     Status: None   Collection Time: 09/20/18  1:00 PM  Result Value Ref Range   WBC 6.6 4.0 - 10.5 K/uL   RBC 4.58 4.22 - 5.81 MIL/uL   Hemoglobin 13.2 13.0 - 17.0 g/dL   HCT 40.6 39.0 - 52.0 %   MCV 88.6 80.0 - 100.0 fL   MCH 28.8 26.0 - 34.0 pg   MCHC 32.5 30.0 - 36.0 g/dL   RDW 12.2 11.5 - 15.5 %   Platelets 210 150 - 400 K/uL   nRBC 0.0 0.0 - 0.2 %   Neutrophils Relative % 70 %   Neutro Abs 4.7 1.7 - 7.7 K/uL   Lymphocytes Relative 18 %   Lymphs Abs 1.2 0.7 - 4.0 K/uL   Monocytes Relative 8 %   Monocytes Absolute 0.5 0.1 - 1.0 K/uL   Eosinophils Relative 2 %   Eosinophils Absolute 0.1 0.0 - 0.5 K/uL   Basophils Relative 1 %   Basophils Absolute 0.0 0.0 - 0.1 K/uL   Immature Granulocytes 1 %   Abs Immature Granulocytes 0.07 0.00 - 0.07 K/uL    Comment: Performed at [facility], [address], [place], [place] [address]  Comprehensive metabolic panel     Status: Abnormal   Collection Time: 09/20/18  1:00 PM  Result Value Ref Range   Sodium 135 135 - 145 mmol/L   Potassium 4.6 3.5 - 5.1 mmol/L   Chloride 100 98 - 111 mmol/L   CO2 23 22 -  32 mmol/L   Glucose, Bld 209 (H) 70 - 99 mg/dL   BUN 13 8 - 23 mg/dL   Creatinine, Ser 0.86 0.61 - 1.24 mg/dL   Calcium 9.0 8.9 - 10.3 mg/dL  Total Protein 6.2 (L) 6.5 - 8.1 g/dL   Albumin 3.8 3.5 - 5.0 g/dL   AST 30 15 - 41 U/L   ALT 29 0 - 44 U/L   Alkaline Phosphatase 63 38 - 126 U/L   Total Bilirubin 0.7 0.3 - 1.2 mg/dL   GFR calc non Af Amer >60 >60 mL/min   GFR calc Af Amer >60 >60 mL/min   Anion gap 12 5 - 15    Comment: Performed at [facility] [address], [place], [place] [address]  Protime-INR     Status: None   Collection Time: 09/20/18  1:00 PM  Result Value Ref Range   Prothrombin Time 13.9 11.4 - 15.2 seconds   INR 1.1 0.8 - 1.2    Comment: (NOTE) INR goal varies based on device and disease states. Performed at [facility], [place] [address], [place], [place] [address]   Urinalysis, Routine w reflex microscopic     Status: None   Collection Time: 09/20/18  1:00 PM  Result Value Ref Range   Color, Urine YELLOW YELLOW   APPearance CLEAR CLEAR   Specific Gravity, Urine 1.008 1.005 - 1.030   pH 7.0 5.0 - 8.0   Glucose, UA NEGATIVE NEGATIVE mg/dL   Hgb urine dipstick NEGATIVE NEGATIVE   Bilirubin Urine NEGATIVE NEGATIVE   Ketones, ur NEGATIVE NEGATIVE mg/dL   Protein, ur NEGATIVE NEGATIVE mg/dL   Nitrite NEGATIVE NEGATIVE   Leukocytes,Ua NEGATIVE NEGATIVE    Comment: Performed at [facility] [address] [place], [place] [address]  Troponin I - ONCE - STAT     Status: None   Collection Time: 09/20/18  1:00 PM  Result Value Ref Range   Troponin I <0.03 <0.03 ng/mL    Comment: Performed at [facility], [place] [address] [place], [place] [address]   Ct Head Wo Contrast  Result Date: 09/20/2018 CLINICAL DATA:  Headache and syncope EXAM: CT HEAD WITHOUT CONTRAST TECHNIQUE: Contiguous axial images were obtained from the base of the skull through the vertex without intravenous contrast. COMPARISON:  Brain MRI June 06, 2013 FINDINGS:  Brain: The ventricles are normal in size and configuration. There is no intracranial mass, hemorrhage, extra-axial fluid collection, or midline shift. There is focal decreased attenuation in the midline of the medulla, a finding that potentially may represent a recent focal infarct in this area. Elsewhere brain parenchyma appears unremarkable. Vascular: There is no appreciable hyperdense vessel. There are foci of calcification in each carotid siphon region. Skull: The bony calvarium appears intact. Sinuses/Orbits: There is mucosal thickening in several ethmoid air cells. Other paranasal sinuses are clear. Orbits appear symmetric bilaterally. Other: Mastoid air cells are clear. IMPRESSION: 1. Decreased attenuation in the midline of the inferior medulla. A focal infarct which may be recent is of concern given this appearance. Elsewhere brain parenchyma appears unremarkable. No mass or hemorrhage. 2.  There are foci of arterial vascular calcification. 3.  Mucosal thickening noted in several ethmoid air cells. Electronically Signed   By: Lowella Grip III M.D.   On: 09/20/2018 13:26   Dg Chest Port 1 View  Result Date: 09/20/2018 CLINICAL DATA:  Syncope. EXAM: PORTABLE CHEST 1 VIEW COMPARISON:  Radiographs of February 09, 2016. FINDINGS: The heart size and mediastinal contours are within normal limits. Both lungs are clear. The visualized skeletal structures are unremarkable. IMPRESSION: No active disease. Electronically Signed   By: Marijo Conception M.D.   On:  09/20/2018 12:55    Pending Labs [employer] (From admission, onward)    Start     Ordered   Signed and Held  HIV antibody (Routine Testing)  Once,   R     Signed and Held   Signed and Held  CBC  (enoxaparin (LOVENOX)    CrCl >/= 30 ml/min)  Once,   R    Comments:  Baseline for enoxaparin therapy IF NOT ALREADY DRAWN.  Notify MD if PLT < 100 K.    Signed and Held   Signed and Held  Creatinine, serum  (enoxaparin (LOVENOX)    CrCl >/= 30  ml/min)  Once,   R    Comments:  Baseline for enoxaparin therapy IF NOT ALREADY DRAWN.    Signed and Held   Signed and Held  Creatinine, serum  (enoxaparin (LOVENOX)    CrCl >/= 30 ml/min)  Weekly,   R    Comments:  while on enoxaparin therapy    Signed and Held   Signed and Held  Basic metabolic panel  Tomorrow morning,   R     Signed and Held   Signed and Held  Troponin I - Now Then Q6H  Now then every 6 hours,   R     Signed and Held          Vitals/Pain Today's Vitals   09/20/18 1400 09/20/18 1415 09/20/18 1430 09/20/18 1445  BP: (!) 149/88 (!) 144/90 (!) 155/90 (!) 147/84  Pulse: 76 76 79 77  Resp: (!) 21 (!) 25 20 20   Temp:      TempSrc:      SpO2: 99% 99% 99% 99%  Height:      PainSc:    7     Isolation Precautions No active isolations  Medications Medications  sodium chloride 0.9 % bolus 1,000 mL (0 mLs Intravenous Stopped 09/20/18 1448)    And  0.9 %  sodium chloride infusion ( Intravenous New Bag/Given 09/20/18 1451)  morphine 4 MG/ML injection 4 mg (4 mg Intravenous Given 09/20/18 1448)  ondansetron (ZOFRAN) injection 4 mg (4 mg Intravenous Given 09/20/18 1448)    Mobility walks High fall risk   Focused Assessments Cardiac Assessment Handoff:  Cardiac Rhythm: Normal sinus rhythm Lab Results  Component Value Date   CKTOTAL 80 06/05/2010   CKMB 2.4 06/05/2010   TROPONINI <0.03 09/20/2018   Lab Results  Component Value Date   DDIMER  06/05/2010    <0.22        AT THE INHOUSE ESTABLISHED CUTOFF VALUE OF 0.48 ug/mL FEU, THIS ASSAY HAS BEEN DOCUMENTED IN THE LITERATURE TO HAVE A SENSITIVITY AND NEGATIVE PREDICTIVE VALUE OF AT LEAST 98 TO 99%.  THE TEST RESULT SHOULD BE CORRELATED WITH AN ASSESSMENT OF THE CLINICAL PROBABILITY OF DVT / VTE.   Does the Patient currently have chest pain? Yes     R Recommendations: See Admitting Provider Note  Report given to:   Additional Notes:

## 2018-09-20 NOTE — ED Triage Notes (Signed)
Pt [facility].  Pt was doing yard work and his wife saw him fall to the ground.  Initial pressures in 90's with possible complete HB and no radial pulses.  Given 700 ns and 324 asa en-route.  EKG changed to ns.  Pt ao x 4 presently.  Chest pain 2/10.

## 2018-09-21 ENCOUNTER — Telehealth: Payer: Self-pay | Admitting: Physician Assistant

## 2018-09-21 ENCOUNTER — Encounter (HOSPITAL_COMMUNITY): Payer: Self-pay | Admitting: General Practice

## 2018-09-21 ENCOUNTER — Other Ambulatory Visit: Payer: Self-pay | Admitting: Physician Assistant

## 2018-09-21 ENCOUNTER — Telehealth: Payer: Self-pay | Admitting: *Deleted

## 2018-09-21 DIAGNOSIS — R55 Syncope and collapse: Secondary | ICD-10-CM

## 2018-09-21 LAB — BASIC METABOLIC PANEL WITH GFR
Anion gap: 11 (ref 5–15)
BUN: 10 mg/dL (ref 8–23)
CO2: 23 mmol/L (ref 22–32)
Calcium: 8.6 mg/dL — ABNORMAL LOW (ref 8.9–10.3)
Chloride: 101 mmol/L (ref 98–111)
Creatinine, Ser: 0.84 mg/dL (ref 0.61–1.24)
GFR calc Af Amer: >60 mL/min (ref >60)
GFR calc non Af Amer: >60 mL/min (ref >60)
Glucose, Bld: 123 mg/dL — ABNORMAL HIGH (ref 70–99)
Potassium: 4 mmol/L (ref 3.5–5.1)
Sodium: 135 mmol/L (ref 135–145)

## 2018-09-21 LAB — GLUCOSE, CAPILLARY
Glucose-Capillary: 147 mg/dL — ABNORMAL HIGH (ref 70–99)
Glucose-Capillary: 204 mg/dL — ABNORMAL HIGH (ref 70–99)

## 2018-09-21 LAB — HIV ANTIBODY (ROUTINE TESTING W REFLEX): HIV Screen 4th Generation wRfx: NONREACTIVE

## 2018-09-21 LAB — TROPONIN I: Troponin I: <0.03 ng/mL (ref <0.03)

## 2018-09-21 NOTE — Telephone Encounter (Signed)
   TELEPHONE CALL NOTE  This patient has been deemed a candidate for follow-up tele-health visit to limit community exposure during the Covid-19 pandemic. I spoke with the patient via phone to discuss instructions. This has been outlined on the patient's AVS (dotphrase: [email or web address]). The patient was advised to review the section on consent for treatment as well. The patient will receive a phone call 2-3 days prior to their E-Visit at which time consent will be verbally confirmed.   A Virtual Office Visit appointment type has been scheduled for Hospital follow with Dr. Claiborne Billings or APP, with "VIDEO" or "TELEPHONE" in the appointment notes - patient prefers VIDEO type.  I have either confirmed the patient is active in MyChart or offered to send sign-up link to phone/email via Mychart icon beside patient's photo.  [place], [place] 09/21/2018 9:55 AM

## 2018-09-21 NOTE — Discharge Instructions (Signed)
YOUR CARDIOLOGY TEAM HAS ARRANGED FOR AN E-VISIT FOR YOUR APPOINTMENT - PLEASE REVIEW IMPORTANT INFORMATION BELOW SEVERAL DAYS PRIOR TO YOUR APPOINTMENT ° °Due to the recent COVID-19 pandemic, we are transitioning in-person office visits to tele-medicine visits in an effort to decrease unnecessary exposure to our patients, their families, and staff. These visits are billed to your insurance just like a normal visit is. We also encourage you to sign up for MyChart if you have not already done so. You will need a smartphone if possible. For patients that do not have this, we can still complete the visit using a regular telephone but do prefer a smartphone to enable video when possible. You may have a family member that lives with you that can help. If possible, we also ask that you have a blood pressure cuff and scale at home to measure your blood pressure, heart rate and weight prior to your scheduled appointment. Patients with clinical needs that need an in-person evaluation and testing will still be able to come to the office if absolutely necessary. If you have any questions, feel free to call our office. ° ° ° ° °YOUR PROVIDER WILL BE USING THE FOLLOWING PLATFORM TO COMPLETE YOUR VISIT:  ° °• IF USING MYCHART - How to Download the MyChart App to Your SmartPhone  ° °- If Apple, go to App Store and type in MyChart in the search bar and download the app. If Android, ask patient to go to Google Play Store and type in MyChart in the search bar and download the app. The app is free but as with any other app downloads, your phone may require you to verify saved payment information or Apple/Android password.  °- You will need to then log into the app with your MyChart username and password, and select [facility] as your healthcare provider to link the account.  °- When it is time for your visit, go to the MyChart app, find appointments, and click Begin Video Visit. Be sure to Select Allow for your device to access the  Microphone and Camera for your visit. You will then be connected, and your provider will be with you shortly. ° **If you have any issues connecting or need assistance, please contact MyChart service desk [phone] ([phone])** ° **If using a computer, in order to ensure the best quality for your visit, you will need to use either of the following Internet Browsers: Google Chrome or Microsoft Edge** ° °• IF USING DOXIMITY or [email or web address] - The staff will give you instructions on receiving your link to join the meeting the day of your visit.  ° ° ° ° °2-3 DAYS BEFORE YOUR APPOINTMENT ° °You will receive a telephone call from one of our [facility] team members - your caller ID may say "Unknown caller." If this is a video visit, we will walk you through how to get the video launched on your phone. We will remind you check your blood pressure, heart rate and weight prior to your scheduled appointment. If you have an Apple Watch or Kardia, please upload any pertinent ECG strips the day before or morning of your appointment to MyChart. Our staff will also make sure you have reviewed the consent and agree to move forward with your scheduled tele-health visit.  ° ° ° °THE DAY OF YOUR APPOINTMENT ° °Approximately 15 minutes prior to your scheduled appointment, you will receive a telephone call from one of [facility] team - your caller ID may say "Unknown caller."    Our staff will confirm medications, vital signs for the day and any symptoms you may be experiencing. Please have this information available prior to the time of visit start. It may also be helpful for you to have a pad of paper and pen handy for any instructions given during your visit. They will also walk you through joining the smartphone meeting if this is a video visit. ° ° ° °CONSENT FOR TELE-HEALTH VISIT - PLEASE REVIEW ° °I hereby voluntarily request, consent and authorize [facility] and its employed or contracted physicians, physician assistants, nurse  practitioners or other licensed health care professionals (the Practitioner), to provide me with telemedicine health care services (the “Services") as deemed necessary by the treating Practitioner. I acknowledge and consent to receive the Services by the Practitioner via telemedicine. I understand that the telemedicine visit will involve communicating with the Practitioner through live audiovisual communication technology and the disclosure of certain medical information by electronic transmission. I acknowledge that I have been given the opportunity to request an in-person assessment or other available alternative prior to the telemedicine visit and am voluntarily participating in the telemedicine visit. ° °I understand that I have the right to withhold or withdraw my consent to the use of telemedicine in the course of my care at any time, without affecting my right to future care or treatment, and that the Practitioner or I may terminate the telemedicine visit at any time. I understand that I have the right to inspect all information obtained and/or recorded in the course of the telemedicine visit and may receive copies of available information for a reasonable fee.  I understand that some of the potential risks of receiving the Services via telemedicine include:  °• Delay or interruption in medical evaluation due to technological equipment failure or disruption; °• Information transmitted may not be sufficient (e.g. poor resolution of images) to allow for appropriate medical decision making by the Practitioner; and/or  °• In rare instances, security protocols could fail, causing a breach of personal health information. ° °Furthermore, I acknowledge that it is my responsibility to provide information about my medical history, conditions and care that is complete and accurate to the best of my ability. I acknowledge that Practitioner's advice, recommendations, and/or decision may be based on factors not within  their control, such as incomplete or inaccurate data provided by me or distortions of diagnostic images or specimens that may result from electronic transmissions. I understand that the practice of medicine is not an exact science and that Practitioner makes no warranties or guarantees regarding treatment outcomes. I acknowledge that I will receive a copy of this consent concurrently upon execution via email to the email address I last provided but may also request a printed copy by calling the office of [facility].   ° °I understand that my insurance will be billed for this visit.  ° °I have read or had this consent read to me. °• I understand the contents of this consent, which adequately explains the benefits and risks of the Services being provided via telemedicine.  °• I have been provided ample opportunity to ask questions regarding this consent and the Services and have had my questions answered to my satisfaction. °• I give my informed consent for the services to be provided through the use of telemedicine in my medical care ° °By participating in this telemedicine visit I agree to the above. °

## 2018-09-21 NOTE — Progress Notes (Signed)
Patient discharged home in stable condition with his belongings. IVs removed intact. AVS reviewed and questions answered.

## 2018-09-21 NOTE — Telephone Encounter (Signed)
09/21/18 Patient enrolled for [employer] to ship a 30 day cardiac event monitor to his home 2nd day [employer].  Reviewed instructions briefly as the will be included in his monitor kit.

## 2018-09-21 NOTE — Progress Notes (Signed)
Progress Note  Patient Name: Lee [name] Date of Encounter: 09/21/2018  Primary Cardiologist: Shelva Majestic, MD   Subjective   Feels great. No complaints of dizziness.   Inpatient Medications    Scheduled Meds:  aspirin EC  81 mg Oral Daily   carvedilol  12.5 mg Oral BID WC   cholecalciferol  1,000 Units Oral Daily   citalopram  40 mg Oral Daily   enoxaparin (LOVENOX) injection  40 mg Subcutaneous Q24H   fluticasone  2 spray Each Nare QHS   irbesartan  300 mg Oral Daily   pantoprazole  40 mg Oral Daily   potassium chloride  10 mEq Oral Daily   rosuvastatin  40 mg Oral Daily   Continuous Infusions:  sodium chloride 125 mL/hr at 09/21/18 0651   PRN Meds: oxyCODONE-acetaminophen **AND** oxyCODONE, traMADol, zolpidem   Vital Signs    Vitals:   09/20/18 2147 09/20/18 2336 09/21/18 0643 09/21/18 0649  BP: 136/69 124/61 135/65   Pulse: 78 74 74   Resp: 17 19 19    Temp:  98.2 F (36.8 C) 98.1 F (36.7 C)   TempSrc:  Oral Oral   SpO2: 93% 94% 93%   Weight:    121.5 kg  Height:        Intake/Output Summary (Last 24 hours) at 09/21/2018 0934 Last data filed at 09/21/2018 0600 Gross per 24 hour  Intake 3250.29 ml  Output 400 ml  Net 2850.29 ml   Last 3 Weights 09/21/2018 03/28/2018 01/10/2018  Weight (lbs) 267 lb 12.8 oz 271 lb 269 lb  Weight (kg) 121.473 kg 122.925 kg 122.018 kg      Telemetry    NSR with PACs and blocked PACs. No brady or AV block - Personally Reviewed  ECG    NSR with RBBB - Personally Reviewed  Physical Exam    GEN: No acute distress.  obese Neck: No JVD Cardiac: RRR, no murmurs, rubs, or gallops.  Respiratory: Clear to auscultation bilaterally. GI: Soft, nontender, non-distended  MS: No edema; No deformity. Neuro:  Nonfocal  Psych: Normal affect   Labs    Chemistry Recent Labs  Lab 09/20/18 1300 09/21/18 0341  NA 135 135  K 4.6 4.0  CL 100 101  CO2 23 23  GLUCOSE 209* 123*  BUN 13 10  CREATININE 0.86  0.84  CALCIUM 9.0 8.6*  PROT 6.2*  --   ALBUMIN 3.8  --   AST 30  --   ALT 29  --   ALKPHOS 63  --   BILITOT 0.7  --   GFRNONAA >60 >60  GFRAA >60 >60  ANIONGAP 12 11     Hematology Recent Labs  Lab 09/20/18 1300  WBC 6.6  RBC 4.58  HGB 13.2  HCT 40.6  MCV 88.6  MCH 28.8  MCHC 32.5  RDW 12.2  PLT 210    Cardiac Enzymes Recent Labs  Lab 09/20/18 1300 09/20/18 1639 09/20/18 2126 09/21/18 0341  TROPONINI <0.03 <0.03 <0.03 <0.03   No results for input(s): TROPIPOC in the last 168 hours.   BNPNo results for input(s): BNP, PROBNP in the last 168 hours.   DDimer No results for input(s): DDIMER in the last 168 hours.   Radiology    Ct Head Wo Contrast  Result Date: 09/20/2018 CLINICAL DATA:  Headache and syncope EXAM: CT HEAD WITHOUT CONTRAST TECHNIQUE: Contiguous axial images were obtained from the base of the skull through the vertex without intravenous contrast. COMPARISON:  Brain MRI  June 06, 2013 FINDINGS: Brain: The ventricles are normal in size and configuration. There is no intracranial mass, hemorrhage, extra-axial fluid collection, or midline shift. There is focal decreased attenuation in the midline of the medulla, a finding that potentially may represent a recent focal infarct in this area. Elsewhere brain parenchyma appears unremarkable. Vascular: There is no appreciable hyperdense vessel. There are foci of calcification in each carotid siphon region. Skull: The bony calvarium appears intact. Sinuses/Orbits: There is mucosal thickening in several ethmoid air cells. Other paranasal sinuses are clear. Orbits appear symmetric bilaterally. Other: Mastoid air cells are clear. IMPRESSION: 1. Decreased attenuation in the midline of the inferior medulla. A focal infarct which may be recent is of concern given this appearance. Elsewhere brain parenchyma appears unremarkable. No mass or hemorrhage. 2.  There are foci of arterial vascular calcification. 3.  Mucosal  thickening noted in several ethmoid air cells. Electronically Signed   By: Lowella Grip III M.D.   On: 09/20/2018 13:26   Mr Jeri Cos And Wo Contrast  Result Date: 09/20/2018 CLINICAL DATA:  Collapse.  Focal neurologic deficit. EXAM: MRI HEAD WITHOUT AND WITH CONTRAST TECHNIQUE: Multiplanar, multiecho pulse sequences of the brain and surrounding structures were obtained without and with intravenous contrast. CONTRAST:  10 mL Gadavist COMPARISON:  Head CT 09/20/2018 FINDINGS: BRAIN: There is no acute infarct, acute hemorrhage or extra-axial collection. The midline structures are normal. No midline shift or other mass effect. The white matter signal is normal for the patient's age. The cerebral and cerebellar volume are age-appropriate. No hydrocephalus. Susceptibility-sensitive sequences show no chronic microhemorrhage or superficial siderosis. No abnormal contrast enhancement. VASCULAR: The major intracranial arterial and venous sinus flow voids are normal. SKULL AND UPPER CERVICAL SPINE: Calvarial bone marrow signal is normal. There is no skull base mass. Visualized upper cervical spine and soft tissues are normal. SINUSES/ORBITS: No fluid levels or advanced mucosal thickening. No mastoid or middle ear effusion. The orbits are normal. IMPRESSION: Normal aging brain. Electronically Signed   By: Ulyses Jarred M.D.   On: 09/20/2018 22:49   Dg Chest Port 1 View  Result Date: 09/20/2018 CLINICAL DATA:  Syncope. EXAM: PORTABLE CHEST 1 VIEW COMPARISON:  Radiographs of February 09, 2016. FINDINGS: The heart size and mediastinal contours are within normal limits. Both lungs are clear. The visualized skeletal structures are unremarkable. IMPRESSION: No active disease. Electronically Signed   By: Marijo Conception M.D.   On: 09/20/2018 12:55    Cardiac Studies   Echo: IMPRESSIONS    1. The left ventricle has normal systolic function with an ejection fraction of 60-65%. The cavity size was normal. There is  mild concentric left ventricular hypertrophy. Left ventricular diastolic Doppler parameters are consistent with  pseudonormalization. No evidence of left ventricular regional wall motion abnormalities.  2. The right ventricle has normal systolic function. The cavity was normal. There is no increase in right ventricular wall thickness.  3. Left atrial size was mildly dilated.  4. There is mild mitral annular calcification present.  5. The aortic valve is tricuspid. Mild sclerosis of the aortic valve.  Patient Profile     68 y.o. male with hx of CAD and previous stenting of the LAD and RCA, who presents via EMS after a syncopal episode  Assessment & Plan    1. Syncope: evaluation here is unremarkable. No significant arrhythmia. Apparently had some transient heart block noted by EMS. Suspect this was vagally mediated with vasovagal syncope. Echo normal. CT and MRI  normal. Labs all OK. No MI. Ecg with chronic RBBB. Will plan DC home today with outpatient Zio patch. 2. Coronary artery disease with angina: had chest wall pain on admission. Enzymes all normal. Ecg is stable.  3. Type 2 diabetes 4. Paroxysmal atrial fibrillation: The patient is status post A. fib ablation.  It does not appear that he is been on chronic anticoagulation.  He has had no recurrence of atrial fibrillation since his ablation in 2012.   For questions or updates, please contact [place] Please consult [email or web address] for contact info under        Signed, Shanikia Kernodle Martinique, MD  09/21/2018, 9:34 AM

## 2018-09-21 NOTE — Discharge Summary (Signed)
Discharge Summary    Patient ID: Lee [name] MRN: [id number]; DOB: 07/17/1950  Admit date: 09/20/2018 Discharge date: 09/21/2018  Primary Care Provider: Susy Frizzle, MD  Primary Cardiologist: Shelva Majestic, MD   Discharge Diagnoses    Active Problems:   Syncope, vasovagal    Transient CHB   CAD   DM   HTN   PAF  Allergies Allergies  Allergen Reactions   Ibuprofen Swelling   Other Shortness Of Breath    BETA BLOCKERS   Topamax [Topiramate] Other (See Comments)    Pt states it made his tongue feel "scalded" and he couldn't eat    Toprol Xl [Metoprolol Succinate] Other (See Comments)    Personality change   Metoprolol-Hctz Er Other (See Comments)    Indigestion,mouth raw    Diagnostic Studies/Procedures    Echo  1. The left ventricle has normal systolic function with an ejection fraction of 60-65%. The cavity size was normal. There is mild concentric left ventricular hypertrophy. Left ventricular diastolic Doppler parameters are consistent with  pseudonormalization. No evidence of left ventricular regional wall motion abnormalities.  2. The right ventricle has normal systolic function. The cavity was normal. There is no increase in right ventricular wall thickness.  3. Left atrial size was mildly dilated.  4. There is mild mitral annular calcification present.  5. The aortic valve is tricuspid. Mild sclerosis of the aortic valve.   History of Present Illness     Lee [name] is a 68 y.o. male with hx of CAD and previous stenting of the LAD and RCA, DM, HTN, HLD, OSA and COPD who presented via EMS after a syncopal episode.  His last heart catheterization in 2015 demonstrated continued patency of both stent sites.  The patient was in his normal state of health  when he went out to do some yard work in AM on day of presentation.  While he was out in the yard he became clammy and weak.  His wife was out working with him and she heard him moaning.  When  she looked over he was lying face down on the ground.  She was not able to turn him over to his back, but she was aware that he was breathing spontaneously and continuing to moan.  He then began to regain his consciousness and was noted to have some confusion.  He tried to get up but he was too weak to get to his feet.  At that point she decided to call EMS.  Upon EMS arrival, an EKG initially showed sinus bradycardia with right bundle branch block pattern which is unchanged from his baseline EKG.  However, the patient then developed worsening diaphoresis and bradycardia with evidence of complete heart block on EKG.  At that point a code STEMI was called.  Fortunately the patient responded to IV fluid resuscitation and when he was in route, the code STEMI was canceled because his EKG normalized.  At the time of his arrival in the emergency department, the patient is hemodynamically stable and normal sinus rhythm with no significant ST/T wave changes on his EKG.  He complains of 2/10 chest pain.  He denies nausea, vomiting, or shortness of breath.  He denies any history of syncope.  He has not had recent chest pain at rest or with physical exertion.  His pain is located in the substernal area and left lower chest area and he describes this as a soreness.  There is no pleuritic component.  Admitted overnight for observation.  Hospital Course     Consultants: None  1. Syncope:  - Apparently had some transient heart block noted by EMS. Suspect this was vagally mediated with vasovagal syncope. Echo normal. CT and MRI normal. Labs all OK. No MI. Ecg with chronic RBBB. Overnight telemetry showed NSR with PACs and blocked PACs. No brady or AV block. Outpatient Monitor.   2. Coronary artery disease with angina:  - The patient had resting chest discomfort on arrival. Likely related to chest wall trauma from his fall.  EKG does not showde acute changes. Troponin negative.   3. Paroxysmal atrial  fibrillation s/p ablation.  -  He has had no recurrence of atrial fibrillation since his ablation in 2012.Not on anticoagulation.  4. Complete heart block:  - Seen on EMS rhythm strips. Could be a vagally mediated event versus heart block as an etiology of his syncopal spell. Discontinued diltiazem. Continued beta-blocker.   The patient been seen by Dr. Martinique today and deemed ready for discharge home. All follow-up appointments have been scheduled. Discharge medications are listed below.   Discharge Vitals Blood pressure 135/65, pulse 74, temperature 98.1 F (36.7 C), temperature source Oral, resp. rate 19, height 5\' 8"  (1.727 m), weight 121.5 kg, SpO2 93 %.  Filed Weights   09/21/18 0649  Weight: 121.5 kg    Labs & Radiologic Studies    CBC Recent Labs    09/20/18 1300  WBC 6.6  NEUTROABS 4.7  HGB 13.2  HCT 40.6  MCV 88.6  PLT 517   Basic Metabolic Panel Recent Labs    09/20/18 1300 09/21/18 0341  NA 135 135  K 4.6 4.0  CL 100 101  CO2 23 23  GLUCOSE 209* 123*  BUN 13 10  CREATININE 0.86 0.84  CALCIUM 9.0 8.6*   Liver Function Tests Recent Labs    09/20/18 1300  AST 30  ALT 29  ALKPHOS 63  BILITOT 0.7  PROT 6.2*  ALBUMIN 3.8   No results for input(s): LIPASE, AMYLASE in the last 72 hours. Cardiac Enzymes Recent Labs    09/20/18 1639 09/20/18 2126 09/21/18 0341  TROPONINI <0.03 <0.03 <0.03  _____________  Ct Head Wo Contrast  Result Date: 09/20/2018 CLINICAL DATA:  Headache and syncope EXAM: CT HEAD WITHOUT CONTRAST TECHNIQUE: Contiguous axial images were obtained from the base of the skull through the vertex without intravenous contrast. COMPARISON:  Brain MRI June 06, 2013 FINDINGS: Brain: The ventricles are normal in size and configuration. There is no intracranial mass, hemorrhage, extra-axial fluid collection, or midline shift. There is focal decreased attenuation in the midline of the medulla, a finding that potentially may represent a  recent focal infarct in this area. Elsewhere brain parenchyma appears unremarkable. Vascular: There is no appreciable hyperdense vessel. There are foci of calcification in each carotid siphon region. Skull: The bony calvarium appears intact. Sinuses/Orbits: There is mucosal thickening in several ethmoid air cells. Other paranasal sinuses are clear. Orbits appear symmetric bilaterally. Other: Mastoid air cells are clear. IMPRESSION: 1. Decreased attenuation in the midline of the inferior medulla. A focal infarct which may be recent is of concern given this appearance. Elsewhere brain parenchyma appears unremarkable. No mass or hemorrhage. 2.  There are foci of arterial vascular calcification. 3.  Mucosal thickening noted in several ethmoid air cells. Electronically Signed   By: Lowella Grip III M.D.   On: 09/20/2018 13:26   Mr Jeri Cos And Wo Contrast  Result Date: 09/20/2018 CLINICAL  DATA:  Collapse.  Focal neurologic deficit. EXAM: MRI HEAD WITHOUT AND WITH CONTRAST TECHNIQUE: Multiplanar, multiecho pulse sequences of the brain and surrounding structures were obtained without and with intravenous contrast. CONTRAST:  10 mL Gadavist COMPARISON:  Head CT 09/20/2018 FINDINGS: BRAIN: There is no acute infarct, acute hemorrhage or extra-axial collection. The midline structures are normal. No midline shift or other mass effect. The white matter signal is normal for the patient's age. The cerebral and cerebellar volume are age-appropriate. No hydrocephalus. Susceptibility-sensitive sequences show no chronic microhemorrhage or superficial siderosis. No abnormal contrast enhancement. VASCULAR: The major intracranial arterial and venous sinus flow voids are normal. SKULL AND UPPER CERVICAL SPINE: Calvarial bone marrow signal is normal. There is no skull base mass. Visualized upper cervical spine and soft tissues are normal. SINUSES/ORBITS: No fluid levels or advanced mucosal thickening. No mastoid or middle ear  effusion. The orbits are normal. IMPRESSION: Normal aging brain. Electronically Signed   By: Ulyses Jarred M.D.   On: 09/20/2018 22:49   Dg Chest Port 1 View  Result Date: 09/20/2018 CLINICAL DATA:  Syncope. EXAM: PORTABLE CHEST 1 VIEW COMPARISON:  Radiographs of February 09, 2016. FINDINGS: The heart size and mediastinal contours are within normal limits. Both lungs are clear. The visualized skeletal structures are unremarkable. IMPRESSION: No active disease. Electronically Signed   By: Marijo Conception M.D.   On: 09/20/2018 12:55   Disposition   Pt is being discharged home today in good condition.  Follow-up Plans & Appointments    Follow-up Information    Troy Sine, MD Follow up.   Specialty:  Cardiology Why:  office will call you with monitor set up and virtual office visit Contact information: [address] [place] [address] [place] [address] [phone]          Discharge Instructions    Diet - low sodium heart healthy   Complete by:  As directed    Increase activity slowly   Complete by:  As directed       Discharge Medications   Allergies as of 09/21/2018      Reactions   Ibuprofen Swelling   Other Shortness Of Breath   BETA BLOCKERS   Topamax [topiramate] Other (See Comments)   Pt states it made his tongue feel "scalded" and he couldn't eat    Toprol Xl [metoprolol Succinate] Other (See Comments)   Personality change   Metoprolol-hctz Er Other (See Comments)   Indigestion,mouth raw      Medication List    STOP taking these medications   Cartia XT 120 MG 24 hr capsule Generic drug:  diltiazem     TAKE these medications   Aimovig 140 MG/ML Soaj Generic drug:  Erenumab-aooe Inject 140 mg into the muscle every 30 (thirty) days.   aspirin 81 MG tablet Take 81 mg by mouth daily.   carvedilol 12.5 MG tablet Commonly known as:  COREG TAKE 2 TABLETS BY MOUTH 2  TIMES DAILY WITH A MEAL What changed:  See the new instructions.     cholecalciferol 1000 units tablet Commonly known as:  VITAMIN D Take 1,000 Units by mouth daily.   citalopram 40 MG tablet Commonly known as:  CELEXA TAKE 1 TABLET(40 MG) BY MOUTH DAILY What changed:  See the new instructions.   Co Q 10 100 MG Caps Take 1 capsule by mouth daily.   fexofenadine 180 MG tablet Commonly known as:  ALLEGRA Take by mouth.   fluticasone 50 MCG/ACT nasal spray Commonly  known as:  FLONASE INSTILL 2 SPRAYS IN THE  NOSE AT BEDTIME What changed:  See the new instructions.   furosemide 40 MG tablet Commonly known as:  LASIX TAKE 1 TABLET BY MOUTH  DAILY   nitroGLYCERIN 0.4 MG SL tablet Commonly known as:  NITROSTAT PLACE ONE TABLET UNDER THE TONGUE EVERY 5 MINUTES AS NEEDED FOR CHEST PAIN   oxyCODONE-acetaminophen 10-325 MG tablet Commonly known as:  PERCOCET Take 1 tablet by mouth every 8 (eight) hours as needed for pain.   pantoprazole 40 MG tablet Commonly known as:  PROTONIX TAKE 1 TABLET BY MOUTH  EVERY DAY   potassium chloride 10 MEQ CR capsule Commonly known as:  MICRO-K TAKE 1 CAPSULE BY MOUTH  EVERY DAY What changed:  how much to take   rosuvastatin 40 MG tablet Commonly known as:  CRESTOR TAKE 1 TABLET BY MOUTH  DAILY   telmisartan 40 MG tablet Commonly known as:  MICARDIS Take 1 tablet (40 mg total) by mouth daily.   traMADol 50 MG tablet Commonly known as:  ULTRAM TAKE 1 TABLET(50 MG) BY MOUTH EVERY 8 HOURS AS NEEDED What changed:  See the new instructions.   zolpidem 10 MG tablet Commonly known as:  AMBIEN TAKE 1 TABLET(10 MG) BY MOUTH AT BEDTIME AS NEEDED FOR SLEEP What changed:  See the new instructions.        Acute coronary syndrome (MI, NSTEMI, STEMI, etc) this admission?: No.    Outstanding Labs/Studies  Monitor set up  Duration of Discharge Encounter   Greater than 30 minutes including physician time.  Signed, Leanor Kail, PA 09/21/2018, 10:02 AM

## 2018-09-22 ENCOUNTER — Other Ambulatory Visit: Payer: Self-pay | Admitting: Cardiovascular Disease

## 2018-09-25 ENCOUNTER — Ambulatory Visit (INDEPENDENT_AMBULATORY_CARE_PROVIDER_SITE_OTHER): Payer: Medicare Other

## 2018-09-25 DIAGNOSIS — R55 Syncope and collapse: Secondary | ICD-10-CM | POA: Diagnosis not present

## 2018-09-25 NOTE — Telephone Encounter (Signed)
Rosuvastatin 40 mg refilled. 

## 2018-10-03 ENCOUNTER — Other Ambulatory Visit: Payer: Self-pay

## 2018-10-03 ENCOUNTER — Encounter: Payer: Self-pay | Admitting: Family Medicine

## 2018-10-03 ENCOUNTER — Ambulatory Visit (INDEPENDENT_AMBULATORY_CARE_PROVIDER_SITE_OTHER): Payer: Medicare Other | Admitting: Family Medicine

## 2018-10-03 VITALS — BP 162/96 | HR 74 | Temp 98.6°F | Resp 18 | Ht 68.0 in | Wt 269.0 lb

## 2018-10-03 DIAGNOSIS — Z0001 Encounter for general adult medical examination with abnormal findings: Secondary | ICD-10-CM | POA: Diagnosis not present

## 2018-10-03 DIAGNOSIS — E118 Type 2 diabetes mellitus with unspecified complications: Secondary | ICD-10-CM | POA: Diagnosis not present

## 2018-10-03 DIAGNOSIS — Z0000 Encounter for general adult medical examination without abnormal findings: Secondary | ICD-10-CM

## 2018-10-03 DIAGNOSIS — E785 Hyperlipidemia, unspecified: Secondary | ICD-10-CM

## 2018-10-03 DIAGNOSIS — Z125 Encounter for screening for malignant neoplasm of prostate: Secondary | ICD-10-CM

## 2018-10-03 DIAGNOSIS — I10 Essential (primary) hypertension: Secondary | ICD-10-CM

## 2018-10-03 DIAGNOSIS — I2510 Atherosclerotic heart disease of native coronary artery without angina pectoris: Secondary | ICD-10-CM

## 2018-10-03 DIAGNOSIS — I4891 Unspecified atrial fibrillation: Secondary | ICD-10-CM

## 2018-10-03 DIAGNOSIS — Z9861 Coronary angioplasty status: Secondary | ICD-10-CM

## 2018-10-03 NOTE — Progress Notes (Signed)
Subjective:    Patient ID: Lee [name], male    DOB: [birth year], 68 y.o.   MRN: [id number]  HPI  Patient is here today for complete physical exam.  Patient has been a borderline diabetic as evidenced at his physical last year with a hemoglobin A1c of 6.7.  Recently he was admitted to the hospital with syncope presumed secondary to vasovagal event.  He is currently wearing a cardiac monitor to evaluate for any evidence of AV block that may require pacemaker.  Since the event, he has had no further syncopal episodes or lightheadedness.  However while hospitalized, his blood sugars were between 140 and 200 raising the concern that his diabetes is now out of control.  He denies any polyuria, polydipsia, or blurry vision however he does have several questions regarding a diabetic diet, meeting with a nutritionist, and whether he requires medication.  He denies any chest pain shortness of breath or dyspnea on exertion.  He does have some swelling in his right fourth PIP joint.  He sprained this approximately 1 month ago while loading a trailer.  He has full range of motion but still has some swelling around the PIP joint.  There is no pain.  His immunizations are up-to-date: Immunization History  Administered Date(s) Administered  . Influenza Split 03/04/2013, 02/28/2014, 03/01/2015, 02/26/2016  . Influenza, Seasonal, Injecte, Preservative Fre 03/04/2013, 02/28/2014  . Influenza-Unspecified 03/01/2015, 02/26/2016  . Pneumococcal Conjugate-13 05/29/2015  . Pneumococcal Polysaccharide-23 06/14/2004, 09/17/2016  . Pneumococcal-Unspecified 06/14/2004, 09/17/2016  . Tdap 03/30/2012  . Zoster 03/30/2012   Colonoscopy was performed in 2014 and is not due again until 2024.  He is due for prostate cancer screening with a PSA.  Otherwise has been doing well with no concerns Past Medical History:  Diagnosis Date  . Allergic rhinitis    chronic sinusitis  . Arthritis   . CAD (coronary artery disease)     a. 2009 PCI/DES to mLAD; b. 05/2010 s/p PCI RCA and LAD.; c. 05/2013 Cath: LAD mild ISR, LCX nl, RCA 40p, patent stent.  . Cataract    has had lasik surg  . COPD (chronic obstructive pulmonary disease) ([place])   . Diabetes mellitus type 2, controlled, without complications ([place])   . Diverticulosis   . DJD (degenerative joint disease)   . Esophagitis 1991   grade 1  . GERD (gastroesophageal reflux disease)    Hiatal hernia  . Hemorrhoids   . Hyperlipidemia   . Hypertensive heart disease   . Iron deficiency anemia   . Migraines   . Paroxysmal atrial fibrillation (HCC)    a. s/p afib ablation 10/22/08 ([facility]).  . PVC's (premature ventricular contractions)   . Sleep apnea    Questionalble: RDI during the total sleep time 6h 28 mins was 3.55/hr during REM sleep was at 5.71/hr. Supine AHI was 5.93/hr.  . Syncope 08/2018   Past Surgical History:  Procedure Laterality Date  . CARDIAC CATHETERIZATION  05/2010   The proximal LAD was then predilated with 2.0 x 12 trek. this was then stented with a 2.5 x 16 promus Element drug-eluting stent at 14 atmosphere and prostdilated with 2.75 x 12 [place] trek at 16 atmospheres(2.8 mm) resulting the reduction of 80% proximal LAD stenosis to 0% residual with excellent flow.  Marland Kitchen CARDIAC CATHETERIZATION  06/05/2010   Successful percutaneous coronary intervention to the right coronary artery with percutaneous transluminal coronary angioplasty/stenting and insertion 3.0 x 16 mm Promus DES post dilated to 3.35 mm with  stenosis being reduced to 0%  . CARDIAC CATHETERIZATION  02/2008   Post dilatation was performed using a 2.75 x 9 [place] sprinter, 10 atmospheres for 40 seconds and then 9 atmosphere for 35 sec. This resulted in the 80% area of narrowing pre-intervention, now appearing to normal. There was no edvidence of the dissection or thrombus and there was TIMI III flow pre and post.  . CARDIAC CATHETERIZATION  02/2006  . CORONARY ANGIOPLASTY WITH STENT PLACEMENT      3 stents/ 4 caths  . EYE SURGERY    . KNEE ARTHROSCOPY     right  . LASIK    . LEFT HEART CATHETERIZATION WITH CORONARY ANGIOGRAM N/A 06/29/2013   Procedure: LEFT HEART CATHETERIZATION WITH CORONARY ANGIOGRAM;  Surgeon: Leonie Man, MD;  Location[facility] CATH LAB;  Service: Cardiovascular;  Laterality: N/A;  . NASAL SINUS SURGERY  2001  . NECK SURGERY     Cervical fusion  . RADIOFREQUENCY ABLATION  May 2010   atrial fibrillation  . ROTATOR CUFF REPAIR     left  . SHOULDER OPEN ROTATOR CUFF REPAIR     right  . SPINE SURGERY    . WRIST ARTHROCENTESIS     left   Current Outpatient Medications on File Prior to Visit  Medication Sig Dispense Refill  . AIMOVIG 140 MG/ML SOAJ Inject 140 mg into the muscle every 30 (thirty) days.    Marland Kitchen aspirin 81 MG tablet Take 81 mg by mouth daily.    . carvedilol (COREG) 12.5 MG tablet TAKE 2 TABLETS BY MOUTH 2  TIMES DAILY WITH A MEAL 360 tablet 3  . cholecalciferol (VITAMIN D) 1000 UNITS tablet Take 1,000 Units by mouth daily.    . citalopram (CELEXA) 40 MG tablet TAKE 1 TABLET(40 MG) BY MOUTH DAILY 90 tablet 3  . Coenzyme Q10 (CO Q 10) 100 MG CAPS Take 1 capsule by mouth daily.    . fexofenadine (ALLEGRA) 180 MG tablet Take by mouth.    . fluticasone (FLONASE) 50 MCG/ACT nasal spray INSTILL 2 SPRAYS IN THE  NOSE AT BEDTIME (Patient taking differently: Place 2 sprays into both nostrils daily. INSTILL 2 SPRAYS IN THE NOSE AT BEDTIME) 48 g 3  . furosemide (LASIX) 40 MG tablet TAKE 1 TABLET BY MOUTH  DAILY 90 tablet 3  . nitroGLYCERIN (NITROSTAT) 0.4 MG SL tablet PLACE ONE TABLET UNDER THE TONGUE EVERY 5 MINUTES AS NEEDED FOR CHEST PAIN 25 tablet 3  . oxyCODONE-acetaminophen (PERCOCET) 10-325 MG tablet Take 1 tablet by mouth every 8 (eight) hours as needed for pain. 21 tablet 0  . pantoprazole (PROTONIX) 40 MG tablet TAKE 1 TABLET BY MOUTH  EVERY DAY 90 tablet 3  . potassium chloride (MICRO-K) 10 MEQ CR capsule TAKE 1 CAPSULE BY MOUTH  EVERY DAY 90  capsule 3  . rosuvastatin (CRESTOR) 40 MG tablet TAKE 1 TABLET BY MOUTH  DAILY 90 tablet 0  . telmisartan (MICARDIS) 40 MG tablet Take 1 tablet (40 mg total) by mouth daily. 90 tablet 1  . traMADol (ULTRAM) 50 MG tablet TAKE 1 TABLET(50 MG) BY MOUTH EVERY 8 HOURS AS NEEDED 30 tablet 0  . zolpidem (AMBIEN) 10 MG tablet TAKE 1 TABLET(10 MG) BY MOUTH AT BEDTIME AS NEEDED FOR SLEEP 30 tablet 0   No current facility-administered medications on file prior to visit.    Allergies  Allergen Reactions  . Ibuprofen Swelling  . Other Shortness Of Breath    BETA BLOCKERS  . Topamax [Topiramate]  Other (See Comments)    Pt states it made his tongue feel "scalded" and he couldn't eat   . Toprol Xl [Metoprolol Succinate] Other (See Comments)    Personality change  . Metoprolol-Hctz Er Other (See Comments)    Indigestion,mouth raw   Social History   Socioeconomic History  . Marital status: Married    Spouse name: Not on file  . Number of children: 2  . Years of education: Not on file  . Highest education level: Not on file  Occupational History  . Occupation: retired  Scientific laboratory technician  . Financial resource strain: Not on file  . Food insecurity:    Worry: Not on file    Inability: Not on file  . Transportation needs:    Medical: Not on file    Non-medical: Not on file  Tobacco Use  . Smoking status: Former Smoker    Last attempt to quit: 06/01/1987    Years since quitting: 31.3  . Smokeless tobacco: Never Used  Substance and Sexual Activity  . Alcohol use: Yes    Alcohol/week: 0.0 standard drinks    Comment: once every 6-7 months  . Drug use: No  . Sexual activity: Not on file  Lifestyle  . Physical activity:    Days per week: Not on file    Minutes per session: Not on file  . Stress: Not on file  Relationships  . Social connections:    Talks on phone: Not on file    Gets together: Not on file    Attends religious service: Not on file    Active member of club or organization: Not  on file    Attends meetings of clubs or organizations: Not on file    Relationship status: Not on file  . Intimate partner violence:    Fear of current or ex partner: Not on file    Emotionally abused: Not on file    Physically abused: Not on file    Forced sexual activity: Not on file  Other Topics Concern  . Not on file  Social History Narrative  . Not on file   Family History  Problem Relation Age of Onset  . Heart disease Father        and sister  . Diabetes Father   . Colon cancer Mother        mets from uterine  . Uterine cancer Mother   . Heart disease Sister      Review of Systems  All other systems reviewed and are negative.      Objective:   Physical Exam Vitals signs reviewed.  Constitutional:      General: He is not in acute distress.    Appearance: He is well-developed. He is not diaphoretic.  HENT:     Head: Normocephalic and atraumatic.     Right Ear: External ear normal.     Left Ear: External ear normal.     Nose: Nose normal.     Mouth/Throat:     Pharynx: No oropharyngeal exudate.  Eyes:     General: No scleral icterus.       Right eye: No discharge.        Left eye: No discharge.     Conjunctiva/sclera: Conjunctivae normal.     Pupils: Pupils are equal, round, and reactive to light.  Neck:     Musculoskeletal: Normal range of motion and neck supple.     Thyroid: No thyromegaly.     Vascular: No JVD.  Trachea: No tracheal deviation.  Cardiovascular:     Rate and Rhythm: Normal rate and regular rhythm.     Heart sounds: Murmur present. No friction rub. No gallop.   Pulmonary:     Effort: Pulmonary effort is normal. No respiratory distress.     Breath sounds: Normal breath sounds. No stridor. No wheezing or rales.  Chest:     Chest wall: No tenderness.  Abdominal:     General: Bowel sounds are normal. There is no distension.     Palpations: Abdomen is soft. There is no mass.     Tenderness: There is no abdominal tenderness. There is  no guarding or rebound.     Hernia: No hernia is present.  Musculoskeletal: Normal range of motion.        General: No tenderness.  Lymphadenopathy:     Cervical: No cervical adenopathy.  Skin:    General: Skin is warm.     Capillary Refill: Capillary refill takes less than 2 seconds.     Coloration: Skin is not pale.     Findings: No erythema or rash.  Neurological:     Mental Status: He is alert and oriented to person, place, and time.     Cranial Nerves: No cranial nerve deficit.     Sensory: No sensory deficit.     Motor: No abnormal muscle tone.     Coordination: Coordination normal.     Deep Tendon Reflexes: Reflexes normal.  Psychiatric:        Behavior: Behavior normal.        Thought Content: Thought content normal.        Judgment: Judgment normal.           Assessment & Plan:  General medical exam  CAD S/P percutaneous coronary angioplasty  Essential hypertension  Type 2 diabetes mellitus with complication, without long-term current use of insulin (HCC) - Plan: Hemoglobin A1c, CBC with Differential/Platelet, COMPLETE METABOLIC PANEL WITH GFR, Lipid panel, Microalbumin, urine  Atrial fibrillation, unspecified type (HCC)  Hyperlipidemia LDL goal <70  Prostate cancer screening - Plan: PSA  We discussed his diabetes at his physical last year.  However the patient seems surprised that his blood sugars were so high in the hospital.  I have recommended a low carbohydrate diet.  I would like to have him reduce his carbs to less than 45 g per meal and limit his consumption of starches such as bread, rice, potatoes, pasta as well as sweets and fruits and try to eat more stables and low-fat protein.  I will check a hemoglobin A1c and if greater than 6.5 will start the patient on metformin.  Also after COVID quarantine has subsided, I would recommend meeting with a nutritionist.  I recommended 30 minutes of aerobic exercise daily and gradually increase aerobic exercise in  an effort to try to lose 15 to 20 pounds.  Heart rate is currently well controlled.  He is actually in normal sinus rhythm today.  Continue to monitor for any evidence of bradycardia under the care of his cardiologist.  I will screen the patient for prostate cancer with a PSA.  I will check a fasting lipid panel as well as a CBC and a CMP.  Immunizations are up-to-date.  Colonoscopy is up-to-date.  He denies any issues with falls, depression, or memory loss.

## 2018-10-06 ENCOUNTER — Other Ambulatory Visit: Payer: Medicare Other

## 2018-10-06 ENCOUNTER — Other Ambulatory Visit: Payer: Self-pay | Admitting: Family Medicine

## 2018-10-06 ENCOUNTER — Other Ambulatory Visit: Payer: Self-pay

## 2018-10-06 DIAGNOSIS — E118 Type 2 diabetes mellitus with unspecified complications: Secondary | ICD-10-CM

## 2018-10-06 DIAGNOSIS — E785 Hyperlipidemia, unspecified: Secondary | ICD-10-CM

## 2018-10-06 DIAGNOSIS — Z0000 Encounter for general adult medical examination without abnormal findings: Secondary | ICD-10-CM

## 2018-10-06 DIAGNOSIS — I10 Essential (primary) hypertension: Secondary | ICD-10-CM

## 2018-10-06 MED ORDER — AMLODIPINE BESYLATE 10 MG PO TABS: 10.0000 mg | ORAL_TABLET | Freq: Every day | ORAL | 3 refills | Status: DC

## 2018-10-07 LAB — CBC WITH DIFFERENTIAL/PLATELET
Absolute Monocytes: 591 cells/uL (ref 200–950)
Basophils Absolute: 51 cells/uL (ref 0–200)
Basophils Relative: 0.7 %
Eosinophils Absolute: 197 cells/uL (ref 15–500)
Eosinophils Relative: 2.7 %
HCT: 39.9 % (ref 38.5–50.0)
Hemoglobin: 13.4 g/dL (ref 13.2–17.1)
Lymphs Abs: 1555 cells/uL (ref 850–3900)
MCH: 29.3 pg (ref 27.0–33.0)
MCHC: 33.6 g/dL (ref 32.0–36.0)
MCV: 87.1 fL (ref 80.0–100.0)
MPV: 9.6 fL (ref 7.5–12.5)
Monocytes Relative: 8.1 %
Neutro Abs: 4906 cells/uL (ref 1500–7800)
Neutrophils Relative %: 67.2 %
Platelets: 245 10*3/uL (ref 140–400)
RBC: 4.58 10*6/uL (ref 4.20–5.80)
RDW: 11.9 % (ref 11.0–15.0)
Total Lymphocyte: 21.3 %
WBC: 7.3 10*3/uL (ref 3.8–10.8)

## 2018-10-07 LAB — HEMOGLOBIN A1C
Hgb A1c MFr Bld: 7.6 % of total Hgb — ABNORMAL HIGH (ref <5.7)
Mean Plasma Glucose: 171 (calc)
eAG (mmol/L): 9.5 (calc)

## 2018-10-07 LAB — COMPREHENSIVE METABOLIC PANEL
AG Ratio: 1.9 (calc) (ref 1.0–2.5)
ALT: 18 U/L (ref 9–46)
AST: 19 U/L (ref 10–35)
Albumin: 4.2 g/dL (ref 3.6–5.1)
Alkaline phosphatase (APISO): 105 U/L (ref 35–144)
BUN: 12 mg/dL (ref 7–25)
CO2: 25 mmol/L (ref 20–32)
Calcium: 9.3 mg/dL (ref 8.6–10.3)
Chloride: 98 mmol/L (ref 98–110)
Creat: 0.79 mg/dL (ref 0.70–1.25)
Globulin: 2.2 g/dL (calc) (ref 1.9–3.7)
Glucose, Bld: 126 mg/dL — ABNORMAL HIGH (ref 65–99)
Potassium: 5 mmol/L (ref 3.5–5.3)
Sodium: 135 mmol/L (ref 135–146)
Total Bilirubin: 0.4 mg/dL (ref 0.2–1.2)
Total Protein: 6.4 g/dL (ref 6.1–8.1)

## 2018-10-07 LAB — LIPID PANEL
Cholesterol: 105 mg/dL (ref <200)
HDL: 41 mg/dL (ref >=40)
LDL Cholesterol (Calc): 50 mg/dL (calc)
Non-HDL Cholesterol (Calc): 64 mg/dL (calc) (ref <130)
Total CHOL/HDL Ratio: 2.6 (calc) (ref <5.0)
Triglycerides: 60 mg/dL (ref <150)

## 2018-10-07 LAB — PSA: PSA: 1.2 ng/mL (ref <=4.0)

## 2018-10-10 ENCOUNTER — Other Ambulatory Visit: Payer: Self-pay

## 2018-10-10 ENCOUNTER — Emergency Department (HOSPITAL_COMMUNITY): Payer: Medicare Other

## 2018-10-10 ENCOUNTER — Encounter: Payer: Self-pay | Admitting: Cardiology

## 2018-10-10 ENCOUNTER — Telehealth: Payer: Self-pay | Admitting: Cardiovascular Disease

## 2018-10-10 ENCOUNTER — Observation Stay (HOSPITAL_COMMUNITY)
Admission: EM | Admit: 2018-10-10 | Discharge: 2018-10-12 | Disposition: A | Discharge status: Home / Self Care | Payer: Medicare Other | Attending: Internal Medicine | Admitting: Internal Medicine

## 2018-10-10 ENCOUNTER — Telehealth: Payer: Self-pay | Admitting: Family Medicine

## 2018-10-10 ENCOUNTER — Encounter: Payer: Self-pay | Admitting: Cardiovascular Disease

## 2018-10-10 ENCOUNTER — Encounter (HOSPITAL_COMMUNITY): Payer: Self-pay

## 2018-10-10 DIAGNOSIS — I2510 Atherosclerotic heart disease of native coronary artery without angina pectoris: Secondary | ICD-10-CM | POA: Diagnosis not present

## 2018-10-10 DIAGNOSIS — E119 Type 2 diabetes mellitus without complications: Secondary | ICD-10-CM | POA: Diagnosis not present

## 2018-10-10 DIAGNOSIS — I442 Atrioventricular block, complete: Secondary | ICD-10-CM | POA: Diagnosis not present

## 2018-10-10 DIAGNOSIS — Z1159 Encounter for screening for other viral diseases: Secondary | ICD-10-CM | POA: Diagnosis not present

## 2018-10-10 DIAGNOSIS — I4510 Unspecified right bundle-branch block: Secondary | ICD-10-CM | POA: Diagnosis not present

## 2018-10-10 DIAGNOSIS — Z7982 Long term (current) use of aspirin: Secondary | ICD-10-CM | POA: Insufficient documentation

## 2018-10-10 DIAGNOSIS — I10 Essential (primary) hypertension: Secondary | ICD-10-CM | POA: Diagnosis not present

## 2018-10-10 DIAGNOSIS — R001 Bradycardia, unspecified: Secondary | ICD-10-CM | POA: Diagnosis not present

## 2018-10-10 DIAGNOSIS — J449 Chronic obstructive pulmonary disease, unspecified: Secondary | ICD-10-CM | POA: Insufficient documentation

## 2018-10-10 DIAGNOSIS — R55 Syncope and collapse: Secondary | ICD-10-CM | POA: Diagnosis present

## 2018-10-10 DIAGNOSIS — Z87891 Personal history of nicotine dependence: Secondary | ICD-10-CM | POA: Diagnosis not present

## 2018-10-10 DIAGNOSIS — Z79899 Other long term (current) drug therapy: Secondary | ICD-10-CM | POA: Insufficient documentation

## 2018-10-10 LAB — CREATININE, SERUM
Creatinine, Ser: 0.79 mg/dL (ref 0.61–1.24)
GFR calc Af Amer: >60 mL/min (ref >60)
GFR calc non Af Amer: >60 mL/min (ref >60)

## 2018-10-10 LAB — CBC WITH DIFFERENTIAL/PLATELET
Abs Immature Granulocytes: 0.03 10*3/uL (ref 0.00–0.07)
Basophils Absolute: 0 10*3/uL (ref 0.0–0.1)
Basophils Relative: 1 %
Eosinophils Absolute: 0.1 10*3/uL (ref 0.0–0.5)
Eosinophils Relative: 2 %
HCT: 41.7 % (ref 39.0–52.0)
Hemoglobin: 13 g/dL (ref 13.0–17.0)
Immature Granulocytes: 1 %
Lymphocytes Relative: 18 %
Lymphs Abs: 1.1 10*3/uL (ref 0.7–4.0)
MCH: 28.3 pg (ref 26.0–34.0)
MCHC: 31.2 g/dL (ref 30.0–36.0)
MCV: 90.7 fL (ref 80.0–100.0)
Monocytes Absolute: 0.5 10*3/uL (ref 0.1–1.0)
Monocytes Relative: 9 %
Neutro Abs: 4.3 10*3/uL (ref 1.7–7.7)
Neutrophils Relative %: 69 %
Platelets: 207 10*3/uL (ref 150–400)
RBC: 4.6 MIL/uL (ref 4.22–5.81)
RDW: 12.3 % (ref 11.5–15.5)
WBC: 6.1 10*3/uL (ref 4.0–10.5)
nRBC: 0 % (ref 0.0–0.2)

## 2018-10-10 LAB — COMPREHENSIVE METABOLIC PANEL
ALT: 24 U/L (ref 0–44)
AST: 20 U/L (ref 15–41)
Albumin: 3.8 g/dL (ref 3.5–5.0)
Alkaline Phosphatase: 98 U/L (ref 38–126)
Anion gap: 9 (ref 5–15)
BUN: 14 mg/dL (ref 8–23)
CO2: 23 mmol/L (ref 22–32)
Calcium: 8.6 mg/dL — ABNORMAL LOW (ref 8.9–10.3)
Chloride: 102 mmol/L (ref 98–111)
Creatinine, Ser: 0.92 mg/dL (ref 0.61–1.24)
GFR calc Af Amer: >60 mL/min (ref >60)
GFR calc non Af Amer: >60 mL/min (ref >60)
Glucose, Bld: 176 mg/dL — ABNORMAL HIGH (ref 70–99)
Potassium: 5.1 mmol/L (ref 3.5–5.1)
Sodium: 134 mmol/L — ABNORMAL LOW (ref 135–145)
Total Bilirubin: 0.6 mg/dL (ref 0.3–1.2)
Total Protein: 6.1 g/dL — ABNORMAL LOW (ref 6.5–8.1)

## 2018-10-10 LAB — SARS CORONAVIRUS 2 BY RT PCR (HOSPITAL ORDER, PERFORMED IN ~~LOC~~ HOSPITAL LAB): SARS Coronavirus 2: NEGATIVE

## 2018-10-10 LAB — BRAIN NATRIURETIC PEPTIDE: B Natriuretic Peptide: 31.8 pg/mL (ref 0.0–100.0)

## 2018-10-10 LAB — GLUCOSE, CAPILLARY: Glucose-Capillary: 110 mg/dL — ABNORMAL HIGH (ref 70–99)

## 2018-10-10 LAB — TROPONIN I
Troponin I: <0.03 ng/mL (ref <0.03)
Troponin I: <0.03 ng/mL (ref <0.03)

## 2018-10-10 LAB — TSH: TSH: 0.802 u[IU]/mL (ref 0.350–4.500)

## 2018-10-10 MED ORDER — VITAMIN D 25 MCG (1000 UNIT) PO TABS
1000.0000 [IU] | ORAL_TABLET | Freq: Every day | ORAL | Status: DC
  Administered 2018-10-10 – 2018-10-12 (×3): 1000 [IU] via ORAL
  Filled 2018-10-10 (×3): qty 1

## 2018-10-10 MED ORDER — OXYCODONE HCL 5 MG PO TABS
5.0000 mg | ORAL_TABLET | Freq: Three times a day (TID) | ORAL | Status: DC | PRN
  Administered 2018-10-11 (×2): 5 mg via ORAL
  Filled 2018-10-10 (×2): qty 1

## 2018-10-10 MED ORDER — INSULIN ASPART 100 UNIT/ML ~~LOC~~ SOLN
0.0000 [IU] | Freq: Three times a day (TID) | SUBCUTANEOUS | Status: DC
  Administered 2018-10-11: 3 [IU] via SUBCUTANEOUS
  Administered 2018-10-11 (×2): 2 [IU] via SUBCUTANEOUS
  Administered 2018-10-12: 3 [IU] via SUBCUTANEOUS
  Administered 2018-10-12: 2 [IU] via SUBCUTANEOUS

## 2018-10-10 MED ORDER — ONDANSETRON HCL 4 MG/2ML IJ SOLN: 4.0000 mg | Freq: Four times a day (QID) | INTRAMUSCULAR | Status: DC | PRN

## 2018-10-10 MED ORDER — LORATADINE 10 MG PO TABS
10.0000 mg | ORAL_TABLET | Freq: Every day | ORAL | Status: DC
  Administered 2018-10-11 – 2018-10-12 (×2): 10 mg via ORAL
  Filled 2018-10-10 (×2): qty 1

## 2018-10-10 MED ORDER — TRAMADOL HCL 50 MG PO TABS: 50.0000 mg | ORAL_TABLET | Freq: Three times a day (TID) | ORAL | Status: DC | PRN

## 2018-10-10 MED ORDER — ALPRAZOLAM 0.25 MG PO TABS: 0.2500 mg | ORAL_TABLET | Freq: Two times a day (BID) | ORAL | Status: DC | PRN

## 2018-10-10 MED ORDER — INSULIN ASPART 100 UNIT/ML ~~LOC~~ SOLN
0.0000 [IU] | Freq: Every day | SUBCUTANEOUS | Status: DC
  Administered 2018-10-11: 2 [IU] via SUBCUTANEOUS

## 2018-10-10 MED ORDER — NITROGLYCERIN 0.4 MG SL SUBL: 0.4000 mg | SUBLINGUAL_TABLET | SUBLINGUAL | Status: DC | PRN

## 2018-10-10 MED ORDER — OXYCODONE-ACETAMINOPHEN 10-325 MG PO TABS: 1.0000 | ORAL_TABLET | Freq: Three times a day (TID) | ORAL | Status: DC | PRN

## 2018-10-10 MED ORDER — IRBESARTAN 150 MG PO TABS
150.0000 mg | ORAL_TABLET | Freq: Every day | ORAL | Status: DC
  Administered 2018-10-11 – 2018-10-12 (×2): 150 mg via ORAL
  Filled 2018-10-10 (×2): qty 1

## 2018-10-10 MED ORDER — ASPIRIN EC 81 MG PO TBEC
81.0000 mg | DELAYED_RELEASE_TABLET | Freq: Every day | ORAL | Status: DC
  Administered 2018-10-10 – 2018-10-12 (×3): 81 mg via ORAL
  Filled 2018-10-10 (×3): qty 1

## 2018-10-10 MED ORDER — PANTOPRAZOLE SODIUM 40 MG PO TBEC
40.0000 mg | DELAYED_RELEASE_TABLET | Freq: Every day | ORAL | Status: DC
  Administered 2018-10-10 – 2018-10-12 (×3): 40 mg via ORAL
  Filled 2018-10-10 (×3): qty 1

## 2018-10-10 MED ORDER — ZOLPIDEM TARTRATE 5 MG PO TABS: 10.0000 mg | ORAL_TABLET | Freq: Every day | ORAL | Status: DC

## 2018-10-10 MED ORDER — CO Q 10 100 MG PO CAPS: 1.0000 | ORAL_CAPSULE | Freq: Every day | ORAL | Status: DC

## 2018-10-10 MED ORDER — POTASSIUM CHLORIDE CRYS ER 20 MEQ PO TBCR
10.0000 meq | EXTENDED_RELEASE_TABLET | Freq: Every day | ORAL | Status: DC
  Administered 2018-10-11 – 2018-10-12 (×2): 10 meq via ORAL
  Filled 2018-10-10 (×2): qty 1

## 2018-10-10 MED ORDER — FUROSEMIDE 20 MG PO TABS
40.0000 mg | ORAL_TABLET | Freq: Every day | ORAL | Status: DC
  Administered 2018-10-11 – 2018-10-12 (×2): 40 mg via ORAL
  Filled 2018-10-10 (×2): qty 2

## 2018-10-10 MED ORDER — CITALOPRAM HYDROBROMIDE 20 MG PO TABS
40.0000 mg | ORAL_TABLET | Freq: Every day | ORAL | Status: DC
  Administered 2018-10-11 – 2018-10-12 (×2): 40 mg via ORAL
  Filled 2018-10-10 (×2): qty 2

## 2018-10-10 MED ORDER — ACETAMINOPHEN 325 MG PO TABS
650.0000 mg | ORAL_TABLET | ORAL | Status: DC | PRN
  Administered 2018-10-10 – 2018-10-11 (×2): 650 mg via ORAL
  Filled 2018-10-10 (×2): qty 2

## 2018-10-10 MED ORDER — FLUTICASONE PROPIONATE 50 MCG/ACT NA SUSP
2.0000 | Freq: Every day | NASAL | Status: DC
  Administered 2018-10-10 – 2018-10-11 (×2): 2 via NASAL
  Filled 2018-10-10: qty 16

## 2018-10-10 MED ORDER — OXYCODONE-ACETAMINOPHEN 5-325 MG PO TABS
1.0000 | ORAL_TABLET | Freq: Three times a day (TID) | ORAL | Status: DC | PRN
  Administered 2018-10-11 (×3): 1 via ORAL
  Filled 2018-10-10 (×3): qty 1

## 2018-10-10 MED ORDER — AMLODIPINE BESYLATE 5 MG PO TABS
10.0000 mg | ORAL_TABLET | Freq: Every day | ORAL | Status: DC
  Administered 2018-10-10 – 2018-10-12 (×3): 10 mg via ORAL
  Filled 2018-10-10 (×3): qty 2

## 2018-10-10 MED ORDER — ENOXAPARIN SODIUM 40 MG/0.4ML ~~LOC~~ SOLN
40.0000 mg | SUBCUTANEOUS | Status: DC
  Administered 2018-10-10 – 2018-10-11 (×2): 40 mg via SUBCUTANEOUS
  Filled 2018-10-10 (×2): qty 0.4

## 2018-10-10 MED ORDER — ZOLPIDEM TARTRATE 5 MG PO TABS
5.0000 mg | ORAL_TABLET | Freq: Every evening | ORAL | Status: DC | PRN
  Administered 2018-10-10 – 2018-10-11 (×2): 5 mg via ORAL
  Filled 2018-10-10 (×2): qty 1

## 2018-10-10 MED ORDER — ROSUVASTATIN CALCIUM 20 MG PO TABS
40.0000 mg | ORAL_TABLET | Freq: Every day | ORAL | Status: DC
  Administered 2018-10-10 – 2018-10-12 (×3): 40 mg via ORAL
  Filled 2018-10-10 (×3): qty 2

## 2018-10-10 NOTE — ED Notes (Signed)
Attempted report 

## 2018-10-10 NOTE — Telephone Encounter (Signed)
We need to know what his event monitor showed during the event.  Please call his cardiologist and notify them he had another syncopal episode and can they get the data from his monitor.

## 2018-10-10 NOTE — ED Notes (Addendum)
ED TO INPATIENT HANDOFF REPORT  ED Nurse Name and Phone #:   S Name/Age/Gender Lee [name] 68 y.o. male Room/Bed: 016C/016C  Code Status   Code Status: Prior  Home/SNF/Other Home Patient oriented to: self, place, time and situation Is this baseline? Yes   Triage Complete: Triage complete  Chief Complaint brady  Triage Note Pt arrived to ED via EMS. Per EMS Pt was admitted to [facility] on 4/22 for a syncopal episode. Heart monitor placed on 4/27 and pt discharged to home. While at home pt was cutting down tree and had another syncopal episode. EMS arrived on scene, pts HR "in the 20's" and pt on complete heart block. Pt c/o jabbing pain in left back area during episode. Once pt was put on stretcher HR returned to 50's. Pt currently has no c/o pain, NAD, NSR on monitor, VSS, will continue to monitor.    Allergies Allergies  Allergen Reactions  . Ibuprofen Swelling  . Other Shortness Of Breath    BETA BLOCKERS  . Topamax [Topiramate] Other (See Comments)    Pt states it made his tongue feel "scalded" and he couldn't eat   . Toprol Xl [Metoprolol Succinate] Other (See Comments)    Personality change  . Metoprolol-Hctz Er Other (See Comments)    Indigestion,mouth raw    Level of Care/Admitting Diagnosis ED Disposition    ED Disposition Condition Comment   Admit  Hospital Area: [facility] [[id number]]  Level of Care: Telemetry Cardiac [103]  Covid Evaluation: N/A  Diagnosis: Symptomatic bradycardia [989211]  Admitting Physician: [name] [[id number]]  Attending Physician: [name] [[id number]]  PT Class (Do Not Modify): Observation [104]  PT Acc Code (Do Not Modify): Observation [10022]       B Medical/Surgery History Past Medical History:  Diagnosis Date  . Allergic rhinitis    chronic sinusitis  . Arthritis   . CAD (coronary artery disease)    a. 2009 PCI/DES to mLAD; b. 05/2010 s/p DES RCA and LAD.; c. 05/2013 Cath: LAD mild ISR, LCX nl,  RCA 40p, patent stents.  . Cataract    has had lasik surg  . COPD (chronic obstructive pulmonary disease) ([place])   . Diabetes mellitus type 2, controlled, without complications ([place])   . Diverticulosis   . DJD (degenerative joint disease)   . Esophagitis 1991   grade 1  . GERD (gastroesophageal reflux disease)    Hiatal hernia  . Hemorrhoids   . Hyperlipidemia   . Hypertensive heart disease   . Iron deficiency anemia   . Migraines   . Paroxysmal atrial fibrillation ([facility])    a. s/p afib ablation 10/22/08 ([facility]).  . PVC's (premature ventricular contractions)   . Sleep apnea    Questionalble: RDI during the total sleep time 6h 28 mins was 3.55/hr during REM sleep was at 5.71/hr. Supine AHI was 5.93/hr.  . Syncope 08/2018   Past Surgical History:  Procedure Laterality Date  . CARDIAC CATHETERIZATION  05/2010   The proximal LAD was then predilated with 2.0 x 12 trek. this was then stented with a 2.5 x 16 promus Element drug-eluting stent at 14 atmosphere and prostdilated with 2.75 x 12 [place] trek at 16 atmospheres(2.8 mm) resulting the reduction of 80% proximal LAD stenosis to 0% residual with excellent flow.  Marland Kitchen CARDIAC CATHETERIZATION  06/05/2010   Successful percutaneous coronary intervention to the right coronary artery with percutaneous transluminal coronary angioplasty/stenting and insertion 3.0 x 16 mm Promus DES post  dilated to 3.35 mm with stenosis being reduced to 0%  . CARDIAC CATHETERIZATION  02/2008   Post dilatation was performed using a 2.75 x 9 [place] sprinter, 10 atmospheres for 40 seconds and then 9 atmosphere for 35 sec. This resulted in the 80% area of narrowing pre-intervention, now appearing to normal. There was no edvidence of the dissection or thrombus and there was TIMI III flow pre and post.  . CARDIAC CATHETERIZATION  02/2006  . CORONARY ANGIOPLASTY WITH STENT PLACEMENT     3 stents/ 4 caths  . EYE SURGERY    . KNEE ARTHROSCOPY     right  . LASIK    . LEFT HEART  CATHETERIZATION WITH CORONARY ANGIOGRAM N/A 06/29/2013   Procedure: LEFT HEART CATHETERIZATION WITH CORONARY ANGIOGRAM;  Surgeon: Leonie Man, MD;  Location: [facility] CATH LAB;  Service: Cardiovascular;  Laterality: N/A;  . NASAL SINUS SURGERY  2001  . NECK SURGERY     Cervical fusion  . RADIOFREQUENCY ABLATION  May 2010   atrial fibrillation  . ROTATOR CUFF REPAIR     left  . SHOULDER OPEN ROTATOR CUFF REPAIR     right  . SPINE SURGERY    . WRIST ARTHROCENTESIS     left     A IV Location/Drains/Wounds Patient Lines/Drains/Airways Status   Active Line/Drains/Airways    Name:   Placement date:   Placement time:   Site:   Days:   Peripheral IV 10/10/18 Left Antecubital   10/10/18    -    Antecubital   less than 1          Intake/Output Last 24 hours No intake or output data in the 24 hours ending 10/10/18 1916  Labs/Imaging Results for orders placed or performed during the hospital encounter of 10/10/18 (from the past 48 hour(s))  CBC with Differential     Status: None   Collection Time: 10/10/18  2:13 PM  Result Value Ref Range   WBC 6.1 4.0 - 10.5 K/uL   RBC 4.60 4.22 - 5.81 MIL/uL   Hemoglobin 13.0 13.0 - 17.0 g/dL   HCT 41.7 39.0 - 52.0 %   MCV 90.7 80.0 - 100.0 fL   MCH 28.3 26.0 - 34.0 pg   MCHC 31.2 30.0 - 36.0 g/dL   RDW 12.3 11.5 - 15.5 %   Platelets 207 150 - 400 K/uL   nRBC 0.0 0.0 - 0.2 %   Neutrophils Relative % 69 %   Neutro Abs 4.3 1.7 - 7.7 K/uL   Lymphocytes Relative 18 %   Lymphs Abs 1.1 0.7 - 4.0 K/uL   Monocytes Relative 9 %   Monocytes Absolute 0.5 0.1 - 1.0 K/uL   Eosinophils Relative 2 %   Eosinophils Absolute 0.1 0.0 - 0.5 K/uL   Basophils Relative 1 %   Basophils Absolute 0.0 0.0 - 0.1 K/uL   Immature Granulocytes 1 %   Abs Immature Granulocytes 0.03 0.00 - 0.07 K/uL    Comment: Performed at [facility], [address] [place],  [address]  Comprehensive metabolic panel     Status: Abnormal   Collection Time: 10/10/18  2:13  PM  Result Value Ref Range   Sodium 134 (L) 135 - 145 mmol/L   Potassium 5.1 3.5 - 5.1 mmol/L   Chloride 102 98 - 111 mmol/L   CO2 23 22 - 32 mmol/L   Glucose, Bld 176 (H) 70 - 99 mg/dL   BUN 14 8 - 23  mg/dL   Creatinine, Ser 0.92 0.61 - 1.24 mg/dL   Calcium 8.6 (L) 8.9 - 10.3 mg/dL   Total Protein 6.1 (L) 6.5 - 8.1 g/dL   Albumin 3.8 3.5 - 5.0 g/dL   AST 20 15 - 41 U/L   ALT 24 0 - 44 U/L   Alkaline Phosphatase 98 38 - 126 U/L   Total Bilirubin 0.6 0.3 - 1.2 mg/dL   GFR calc non Af Amer >60 >60 mL/min   GFR calc Af Amer >60 >60 mL/min   Anion gap 9 5 - 15    Comment: Performed at [facility], [address] [place], [place] [address]  Troponin I - ONCE - STAT     Status: None   Collection Time: 10/10/18  2:13 PM  Result Value Ref Range   Troponin I <0.03 <0.03 ng/mL    Comment: Performed at [facility] [address] [place], [place] [address]  Brain natriuretic peptide     Status: None   Collection Time: 10/10/18  2:13 PM  Result Value Ref Range   B Natriuretic Peptide 31.8 0.0 - 100.0 pg/mL    Comment: Performed at [facility] [address], [place], [place] [address]  SARS Coronavirus 2 (CEPHEID - Performed in [place] hospital lab), Hosp Order     Status: None   Collection Time: 10/10/18  2:35 PM  Result Value Ref Range   SARS Coronavirus 2 NEGATIVE NEGATIVE    Comment: (NOTE) If result is NEGATIVE SARS-CoV-2 target nucleic acids are NOT DETECTED. The SARS-CoV-2 RNA is generally detectable in upper and lower  respiratory specimens during the acute phase of infection. The lowest  concentration of SARS-CoV-2 viral copies this assay can detect is 250  copies / mL. A negative result does not preclude SARS-CoV-2 infection  and should not be used as the sole basis for treatment or other  patient management decisions.  A negative result may occur with  improper specimen collection / handling, submission of specimen other  than nasopharyngeal  swab, presence of viral mutation(s) within the  areas targeted by this assay, and inadequate number of viral copies  (<250 copies / mL). A negative result must be combined with clinical  observations, patient history, and epidemiological information. If result is POSITIVE SARS-CoV-2 target nucleic acids are DETECTED. The SARS-CoV-2 RNA is generally detectable in upper and lower  respiratory specimens dur ing the acute phase of infection.  Positive  results are indicative of active infection with SARS-CoV-2.  Clinical  correlation with patient history and other diagnostic information is  necessary to determine patient infection status.  Positive results do  not rule out bacterial infection or co-infection with other viruses. If result is PRESUMPTIVE POSTIVE SARS-CoV-2 nucleic acids MAY BE PRESENT.   A presumptive positive result was obtained on the submitted specimen  and confirmed on repeat testing.  While 2019 novel coronavirus  (SARS-CoV-2) nucleic acids may be present in the submitted sample  additional confirmatory testing may be necessary for epidemiological  and / or clinical management purposes  to differentiate between  SARS-CoV-2 and other Sarbecovirus currently known to infect humans.  If clinically indicated additional testing with an alternate test  methodology [id number]) is advised. The SARS-CoV-2 RNA is generally  detectable in upper and lower respiratory sp ecimens during the acute  phase of infection. The expected result is Negative. Fact Sheet for Patients:  StrictlyIdeas.no Fact Sheet for Healthcare Providers: BankingDealers.co.za This test is  not yet approved or cleared by the [place] and has been authorized for detection and/or diagnosis of SARS-CoV-2 by FDA under an Emergency Use Authorization (EUA).  This EUA will remain in effect (meaning this test can be used) for the duration of the COVID-19 declaration  under Section 564(b)(1) of the Act, 21 U.S.C. section 360bbb-3(b)(1), unless the authorization is terminated or revoked sooner. Performed at [facility], [place] [address], [place], [place] [address]    Dg Chest Portable 1 View  Result Date: 10/10/2018 CLINICAL DATA:  Chest pain. EXAM: PORTABLE CHEST 1 VIEW COMPARISON:  09/20/2018 FINDINGS: Single view of the chest was obtained. Electronic device overlying the mid chest is new. Again noted is a surgical plate in the lower cervical spine. The lungs are clear without pulmonary edema. Heart size is within normal limits and stable. Negative for a pneumothorax. No acute bone abnormality. IMPRESSION: No acute chest findings. Electronically Signed   By: Markus Daft M.D.   On: 10/10/2018 14:33    Pending Labs Unresulted Labs (From admission, onward)    Start     Ordered   Signed and Held  Occult blood card to lab, stool  As needed,   R     Signed and Held   Signed and Held  Creatinine, serum  (enoxaparin (LOVENOX)    CrCl >/= 30 ml/min)  Once,   R    Comments:  Baseline for enoxaparin therapy IF NOT ALREADY DRAWN.    Signed and Held   Signed and Held  Creatinine, serum  (enoxaparin (LOVENOX)    CrCl >/= 30 ml/min)  Weekly,   R    Comments:  while on enoxaparin therapy    Signed and Held   Signed and Held  TSH  Once,   R     Signed and Held   Signed and Held  Troponin I - Now Then Q6H  Now then every 6 hours,   STAT     Signed and Held   Signed and Held  Basic metabolic panel  Tomorrow morning,   R     Signed and Held          Vitals/Pain Today's Vitals   10/10/18 1815 10/10/18 1830 10/10/18 1845 10/10/18 1900  BP: (!) 142/84 135/81 130/83 139/82  Pulse: 69 67 69 65  Resp: 16 14 16 16   Temp:      TempSrc:      SpO2: 95% 96% 96% 96%  Weight:      Height:      PainSc:        Isolation Precautions No active isolations  Medications Medications  amLODipine (NORVASC) tablet 10 mg (has no administration in time range)   aspirin tablet 81 mg (has no administration in time range)  cholecalciferol (VITAMIN D) tablet 1,000 Units (has no administration in time range)  citalopram (CELEXA) tablet 40 mg (has no administration in time range)  Co Q 10 CAPS 1 capsule (has no administration in time range)  loratadine (CLARITIN) tablet 10 mg (has no administration in time range)  fluticasone (FLONASE) 50 MCG/ACT nasal spray 2 spray (has no administration in time range)  furosemide (LASIX) tablet 40 mg (has no administration in time range)  oxyCODONE-acetaminophen (PERCOCET) 10-325 MG per tablet 1 tablet (has no administration in time range)  pantoprazole (PROTONIX) EC tablet 40 mg (has no administration in time range)  potassium chloride SA (K-DUR) CR tablet 10 mEq (has no administration in time range)  rosuvastatin (CRESTOR) tablet 40 mg (has no administration in time range)  irbesartan (AVAPRO) tablet 150 mg (has no administration in time range)  traMADol (ULTRAM) tablet 50 mg (has no administration in time range)  zolpidem (AMBIEN) tablet 10 mg (has no administration in time range)    Mobility walks High fall risk   Focused Assessments Cardiac Assessment Handoff:  Cardiac Rhythm: Normal sinus rhythm Lab Results  Component Value Date   CKTOTAL 80 06/05/2010   CKMB 2.4 06/05/2010   TROPONINI <0.03 10/10/2018   Lab Results  Component Value Date   DDIMER  06/05/2010    <0.22        AT THE INHOUSE ESTABLISHED CUTOFF VALUE OF 0.48 ug/mL FEU, THIS ASSAY HAS BEEN DOCUMENTED IN THE LITERATURE TO HAVE A SENSITIVITY AND NEGATIVE PREDICTIVE VALUE OF AT LEAST 98 TO 99%.  THE TEST RESULT SHOULD BE CORRELATED WITH AN ASSESSMENT OF THE CLINICAL PROBABILITY OF DVT / VTE.   Does the Patient currently have chest pain? No     R Recommendations: See Admitting Provider Note  Report given to:   Additional Notes:  On arrival, patient is hemodynamically stable.  Mentating well.  Denies chest pain or shortness  of breath.Patient had a heart rate of 26 with third-degree AV block.  Subsequent EKG showed normal sinus rhythm.

## 2018-10-10 NOTE — Telephone Encounter (Signed)
New Message   Pt c/o Shortness Of Breath: STAT if SOB developed within the last 24 hours or pt is noticeably SOB on the phone  1. Are you currently SOB (can you hear that pt is SOB on the phone)? Yes   2. How long have you been experiencing SOB? Since Yesterday   3. Are you SOB when sitting or when up moving around? Moving around  4. Are you currently experiencing any other symptoms? Lightheadedness, dizziness, and patient states he passed out for about 15 seconds today and would like to speak to nurse about the issue.

## 2018-10-10 NOTE — ED Triage Notes (Signed)
Pt arrived to ED via EMS. Per EMS Pt was admitted to [facility] on 4/22 for a syncopal episode. Heart monitor placed on 4/27 and pt discharged to home. While at home pt was cutting down tree and had another syncopal episode. EMS arrived on scene, pts HR "in the 20's" and pt on complete heart block. Pt c/o jabbing pain in left back area during episode. Once pt was put on stretcher HR returned to 50's. Pt currently has no c/o pain, NAD, NSR on monitor, VSS, will continue to monitor.

## 2018-10-10 NOTE — Telephone Encounter (Signed)
Noted.  Patient currently in ED.

## 2018-10-10 NOTE — ED Notes (Signed)
Spoke with patients wife Katharine Look and let her know patient had just arrived to ED in stable condition. Will call back with plan of care once tests and labs have resulted. Wife thanked [employer] forces training and education officer for the update.

## 2018-10-10 NOTE — ED Provider Notes (Addendum)
[facility] EMERGENCY DEPARTMENT Provider Note   CSN: [id number] Arrival date & time: 10/10/18  1353    History   Chief Complaint Chief Complaint  Patient presents with  . Loss of Consciousness    HPI Lee [name] is a 68 y.o. male.  HPI 68 year old male with a history of CAD status post PCI to the LAD and RCA, DM, HTN, HLD, OSA, COPD presents after a syncopal episode.  Patient states that he felt lightheaded but went outside to do yard work.  He states that his lightheadedness worsened that things were getting dark he stopped working momentarily with symptom improvement.  When he continued, he then had a syncopal episode.  EMS was called and on their arrival he was found to be bradycardic with a heart rate of 26 and then third-degree heart block.  They initially had issues palpating his blood pressure but he was waking up and by the time they got him loaded into the truck his heart rate was in the 50s and his blood pressure had improved.  He did not require pacing, chest compressions, or ventilatory support.  He complained of left-sided chest pain that has resolved.  On arrival, patient is in sinus rhythm.  Denies chest pain, shortness of breath, nausea, vomiting, diaphoresis.  Of note, patient was recently admitted at the end of April for syncopal event in which she was in third-degree heart block thought to be secondary to vasovagal response.  He had a normal echo.  Past Medical History:  Diagnosis Date  . Allergic rhinitis    chronic sinusitis  . Arthritis   . CAD (coronary artery disease)    a. 2009 PCI/DES to mLAD; b. 05/2010 s/p PCI RCA and LAD.; c. 05/2013 Cath: LAD mild ISR, LCX nl, RCA 40p, patent stent.  . Cataract    has had lasik surg  . COPD (chronic obstructive pulmonary disease) ([place])   . Diabetes mellitus type 2, controlled, without complications ([place])   . Diverticulosis   . DJD (degenerative joint disease)   . Esophagitis 1991   grade 1  .  GERD (gastroesophageal reflux disease)    Hiatal hernia  . Hemorrhoids   . Hyperlipidemia   . Hypertensive heart disease   . Iron deficiency anemia   . Migraines   . Paroxysmal atrial fibrillation (HCC)    a. s/p afib ablation 10/22/08 ([facility]).  . PVC's (premature ventricular contractions)   . Sleep apnea    Questionalble: RDI during the total sleep time 6h 28 mins was 3.55/hr during REM sleep was at 5.71/hr. Supine AHI was 5.93/hr.  . Syncope 08/2018    Patient Active Problem List   Diagnosis Date Noted  . Syncope, vasovagal 09/20/2018  . Neck mass 10/14/2016  . Depression 05/20/2016  . Hypertensive heart disease   . Paroxysmal atrial fibrillation (HCC)   . Hyperlipidemia LDL goal <70 03/27/2014  . Premature ventricular contraction 03/04/2014  . Chest pain with high risk of acute coronary syndrome 06/28/2013  . Morbid obesity ([place]) 06/28/2013  . OSA (obstructive sleep apnea) 12/18/2012  . Sleepiness 11/06/2012  . Fatigue 11/06/2012  . PALPITATIONS 06/02/2009  . ATRIAL FIBRILLATION 10/16/2008  . DYSLIPIDEMIA 09/05/2008  . Essential hypertension 09/05/2008  . CAD S/P percutaneous coronary angioplasty 09/05/2008  . RBBB 09/05/2008  . SUPRAVENTRICULAR TACHYCARDIA 09/05/2008  . ALLERGIC RHINITIS 09/05/2008  . GERD 09/05/2008  . BACK PAIN 09/05/2008  . ARRHYTHMIA, HX OF 09/05/2008  . MIGRAINES, HX OF 09/05/2008  .  Dyslipidemia 09/05/2008    Past Surgical History:  Procedure Laterality Date  . CARDIAC CATHETERIZATION  05/2010   The proximal LAD was then predilated with 2.0 x 12 trek. this was then stented with a 2.5 x 16 promus Element drug-eluting stent at 14 atmosphere and prostdilated with 2.75 x 12 [place] trek at 16 atmospheres(2.8 mm) resulting the reduction of 80% proximal LAD stenosis to 0% residual with excellent flow.  Marland Kitchen CARDIAC CATHETERIZATION  06/05/2010   Successful percutaneous coronary intervention to the right coronary artery with percutaneous transluminal  coronary angioplasty/stenting and insertion 3.0 x 16 mm Promus DES post dilated to 3.35 mm with stenosis being reduced to 0%  . CARDIAC CATHETERIZATION  02/2008   Post dilatation was performed using a 2.75 x 9 [place] sprinter, 10 atmospheres for 40 seconds and then 9 atmosphere for 35 sec. This resulted in the 80% area of narrowing pre-intervention, now appearing to normal. There was no edvidence of the dissection or thrombus and there was TIMI III flow pre and post.  . CARDIAC CATHETERIZATION  02/2006  . CORONARY ANGIOPLASTY WITH STENT PLACEMENT     3 stents/ 4 caths  . EYE SURGERY    . KNEE ARTHROSCOPY     right  . LASIK    . LEFT HEART CATHETERIZATION WITH CORONARY ANGIOGRAM N/A 06/29/2013   Procedure: LEFT HEART CATHETERIZATION WITH CORONARY ANGIOGRAM;  Surgeon: Leonie Man, MD;  Location: [facility] CATH LAB;  Service: Cardiovascular;  Laterality: N/A;  . NASAL SINUS SURGERY  2001  . NECK SURGERY     Cervical fusion  . RADIOFREQUENCY ABLATION  May 2010   atrial fibrillation  . ROTATOR CUFF REPAIR     left  . SHOULDER OPEN ROTATOR CUFF REPAIR     right  . SPINE SURGERY    . WRIST ARTHROCENTESIS     left        Home Medications    Prior to Admission medications   Medication Sig Start Date End Date Taking? Authorizing Provider  AIMOVIG 140 MG/ML SOAJ Inject 140 mg into the muscle every 30 (thirty) days. 08/21/18  Yes [provider]  amLODipine (NORVASC) 10 MG tablet Take 1 tablet (10 mg total) by mouth daily. 10/06/18  Yes Susy Frizzle, MD  aspirin 81 MG tablet Take 81 mg by mouth daily.   Yes [provider]  carvedilol (COREG) 12.5 MG tablet TAKE 2 TABLETS BY MOUTH 2  TIMES DAILY WITH A MEAL Patient taking differently: Take 25 mg by mouth 2 (two) times daily with a meal.  08/23/18  Yes Susy Frizzle, MD  cholecalciferol (VITAMIN D) 1000 UNITS tablet Take 1,000 Units by mouth daily.   Yes [provider]  citalopram (CELEXA) 40 MG tablet TAKE 1  TABLET(40 MG) BY MOUTH DAILY Patient taking differently: Take 40 mg by mouth daily.  10/10/17  Yes Susy Frizzle, MD  Coenzyme Q10 (CO Q 10) 100 MG CAPS Take 1 capsule by mouth daily.   Yes [provider]  fexofenadine (ALLEGRA) 180 MG tablet Take 180 mg by mouth daily.    Yes [provider]  fluticasone (FLONASE) 50 MCG/ACT nasal spray INSTILL 2 SPRAYS IN THE  NOSE AT BEDTIME Patient taking differently: Place 2 sprays into both nostrils at bedtime.  04/06/17  Yes Susy Frizzle, MD  furosemide (LASIX) 40 MG tablet TAKE 1 TABLET BY MOUTH  DAILY Patient taking differently: Take 40 mg by mouth daily.  04/14/18  Yes Shelva Majestic  A, MD  nitroGLYCERIN (NITROSTAT) 0.4 MG SL tablet PLACE ONE TABLET UNDER THE TONGUE EVERY 5 MINUTES AS NEEDED FOR CHEST PAIN Patient taking differently: Place 0.4 mg under the tongue every 5 (five) minutes as needed for chest pain.  11/22/16  Yes Troy Sine, MD  oxyCODONE-acetaminophen (PERCOCET) 10-325 MG tablet Take 1 tablet by mouth every 8 (eight) hours as needed for pain. 07/27/18  Yes Susy Frizzle, MD  pantoprazole (PROTONIX) 40 MG tablet TAKE 1 TABLET BY MOUTH  EVERY DAY Patient taking differently: Take 40 mg by mouth daily.  04/14/18  Yes Troy Sine, MD  potassium chloride (MICRO-K) 10 MEQ CR capsule TAKE 1 CAPSULE BY MOUTH  EVERY DAY Patient taking differently: Take 10 mEq by mouth daily.  04/14/18  Yes Troy Sine, MD  rosuvastatin (CRESTOR) 40 MG tablet TAKE 1 TABLET BY MOUTH  DAILY Patient taking differently: Take 40 mg by mouth daily.  09/25/18  Yes Troy Sine, MD  telmisartan (MICARDIS) 40 MG tablet Take 1 tablet (40 mg total) by mouth daily. 07/10/18  Yes Troy Sine, MD  traMADol (ULTRAM) 50 MG tablet TAKE 1 TABLET(50 MG) BY MOUTH EVERY 8 HOURS AS NEEDED Patient taking differently: Take 50 mg by mouth every 8 (eight) hours as needed for moderate pain.  08/25/18  Yes Susy Frizzle, MD  zolpidem (AMBIEN) 10  MG tablet TAKE 1 TABLET(10 MG) BY MOUTH AT BEDTIME AS NEEDED FOR SLEEP Patient taking differently: Take 10 mg by mouth at bedtime.  03/07/18  Yes Susy Frizzle, MD    Family History Family History  Problem Relation Age of Onset  . Heart disease Father        and sister  . Diabetes Father   . Colon cancer Mother        mets from uterine  . Uterine cancer Mother   . Heart disease Sister     Social History Social History   Tobacco Use  . Smoking status: Former Smoker    Last attempt to quit: 06/01/1987    Years since quitting: 31.3  . Smokeless tobacco: Never Used  Substance Use Topics  . Alcohol use: Yes    Alcohol/week: 0.0 standard drinks    Comment: once every 6-7 months  . Drug use: No     Allergies   Ibuprofen; Other; Topamax [topiramate]; Toprol xl [metoprolol succinate]; and Metoprolol-hctz er   Review of Systems Review of Systems  Constitutional: Negative for chills and fever.  HENT: Negative for ear pain and sore throat.   Eyes: Negative for pain and visual disturbance.  Respiratory: Negative for cough and shortness of breath.   Cardiovascular: Negative for chest pain and palpitations.  Gastrointestinal: Negative for abdominal pain and vomiting.  Genitourinary: Negative for dysuria and hematuria.  Musculoskeletal: Negative for arthralgias and back pain.  Skin: Negative for color change and rash.  Neurological: Positive for syncope. Negative for seizures.  All other systems reviewed and are negative.    Physical Exam Updated Vital Signs BP 131/77   Pulse 73   Temp 98.1 F (36.7 C) (Oral)   Resp 16   Ht 5\' 8"  (1.727 m)   Wt 117.9 kg   SpO2 97%   BMI 39.53 kg/m   Physical Exam Vitals signs and nursing note reviewed.  Constitutional:      Appearance: He is well-developed. He is obese.  HENT:     Head: Normocephalic and atraumatic.  Eyes:     Extraocular  Movements: Extraocular movements intact.     Conjunctiva/sclera: Conjunctivae normal.      Pupils: Pupils are equal, round, and reactive to light.  Neck:     Musculoskeletal: Normal range of motion.  Cardiovascular:     Rate and Rhythm: Normal rate and regular rhythm.     Heart sounds: No murmur.  Pulmonary:     Effort: Pulmonary effort is normal. No respiratory distress.     Breath sounds: Normal breath sounds.  Abdominal:     Palpations: Abdomen is soft.     Tenderness: There is no abdominal tenderness.  Skin:    General: Skin is warm and dry.  Neurological:     General: No focal deficit present.     Mental Status: He is alert and oriented to person, place, and time.      ED Treatments / Results  Labs (all labs ordered are listed, but only abnormal results are displayed) Labs Reviewed  COMPREHENSIVE METABOLIC PANEL - Abnormal; Notable for the following components:      Result Value   Sodium 134 (*)    Glucose, Bld 176 (*)    Calcium 8.6 (*)    Total Protein 6.1 (*)    All other components within normal limits  SARS CORONAVIRUS 2 (HOSPITAL ORDER, [facility])  CBC WITH DIFFERENTIAL/PLATELET  TROPONIN I  BRAIN NATRIURETIC PEPTIDE    EKG EKG Interpretation  Date/Time:  Tuesday Oct 10 2018 14:01:04 EDT Ventricular Rate:  77 PR Interval:    QRS Duration: 145 QT Interval:  430 QTC Calculation: 487 R Axis:   58 Text Interpretation:  Sinus rhythm Prolonged PR interval Probable left atrial enlargement Right bundle branch block Abnormal ECG Confirmed by Carmin Muskrat [phone]) on 10/10/2018 3:23:18 PM   Radiology Dg Chest Portable 1 View  Result Date: 10/10/2018 CLINICAL DATA:  Chest pain. EXAM: PORTABLE CHEST 1 VIEW COMPARISON:  09/20/2018 FINDINGS: Single view of the chest was obtained. Electronic device overlying the mid chest is new. Again noted is a surgical plate in the lower cervical spine. The lungs are clear without pulmonary edema. Heart size is within normal limits and stable. Negative for a pneumothorax. No acute bone  abnormality. IMPRESSION: No acute chest findings. Electronically Signed   By: Markus Daft M.D.   On: 10/10/2018 14:33    Procedures Procedures (including critical care time)  Medications Ordered in ED Medications - No data to display   Initial Impression / Assessment and Plan / ED Course  I have reviewed the triage vital signs and the nursing notes.  Pertinent labs & imaging results that were available during my care of the patient were reviewed by me and considered in my medical decision making (see chart for details).  68 year old male with a history of CAD status post PCI to the LAD and RCA, DM, HTN, HLD, OSA, COPD presents after a syncopal episode.   On arrival, patient is hemodynamically stable.  Mentating well.  Denies chest pain or shortness of breath.  Viewed the EKGs obtained by EMS on their arrival to the scene.  Patient had a heart rate of 26 with third-degree AV block.  Subsequent EKG showed normal sinus rhythm.  Cardiology consulted.  SARS-CoV-2 negative.  BNP 31.  Troponin undetectable.  CBC and CMP unremarkable.  Patient signed out at 3:30 PM.  Final Clinical Impressions(s) / ED Diagnoses   Final diagnoses:  None    ED Discharge Orders    None  Trinidad Curet, MD 10/10/18 1608    Trinidad Curet, MD 10/10/18 Lona Kettle    Carmin Muskrat, MD 10/13/18 Joen Laura

## 2018-10-10 NOTE — Telephone Encounter (Signed)
Received a call from [employer] for a critical monitor report. She report a 36 sec strip showing multiple pauses lasting 6, 7, 9, and 12 seconds. Awaiting strip. Rep spoke with wife who report patient passed out during episode. Wife was instructed to take pt to ED.   Wife spoke with another triage nurse who state pt is getting worse and EMS is on the way.

## 2018-10-10 NOTE — ED Provider Notes (Signed)
  Physical Exam  BP (!) 154/95 (BP Location: Right Arm)   Pulse 71   Temp 98.4 F (36.9 C) (Oral)   Resp 17   Ht 5\' 8"  (1.727 m)   Wt 117.3 kg   SpO2 98%   BMI 39.34 kg/m   Physical Exam  ED Course/Procedures     Procedures  MDM  Patient came in with 3rd degree heart block. Was chopping down a tree and synopsized. Was wearing a holter monitor. Converted here. Now in NSR and asymptomatic. Cardiology consulted.   Patient admitted to cardiology.       Doneta Public, MD 10/10/18 2228    Virgel Manifold, MD 10/11/18 [phone]

## 2018-10-10 NOTE — Telephone Encounter (Signed)
Patient's wife called in stating that patient had another syncopal episode today. She states that he briefly passed out and was breathing with eyes open but unresponsive for a few seconds. He is now back to his baseline almost with some weakness. His wife also informed me that patient is currently wearing a cardiac heart monitor. I advised patient to go to the ER but she stated that she was not going to take him as she feels it will be a waste of time and money. She states he just went and was told he was fine so she wanted me to notify as they trust you. I still informed her that he needs to go to the ER but she insisted that I speak with you. I also advised her that she also needs to call his cardiologist but I will also notify you. Please advise?

## 2018-10-10 NOTE — Telephone Encounter (Signed)
Spoke with pt's wife and pt started to feel bad yesterday was SOB Pt and wife went outside to do  yard work today and pt  slumped over pt's wife was able to get pt to come around and pt passed out once again per wife both episodes lasted about 15 -20 seconds total. Pt is sitting up at this time and sweating profusely While on phone pt's wife stated monitoring company was calling informed wife will hang up and call will call her  back.Spoke with Lars Mage at office today and Earl Gala was on phone with Monitoring company Pt had several pauses was instructed to go to ED Called pt back and pt is worse at this time per wife  and EMS is on way to take pt to ED per wife could hear sirens.Will let [name] know .Adonis Housekeeper

## 2018-10-10 NOTE — Telephone Encounter (Signed)
Received monitor strip. Report Analysis shows Pause (6, 7, 9, & 12 sec); Ventricular Tachycardia ( 6 beats). Will have strip scanned into system for ED to review.

## 2018-10-10 NOTE — H&P (Signed)
Cardiology Admission History and Physical:   Patient ID: DRAE MITZEL; MRN: [id number]; DOB: 14-Apr-1951   Admission date: 10/10/2018  Primary Care Provider: Susy Frizzle, MD Primary Cardiologist: Shelva Majestic, MD 07/22/20019 Primary Electrophysiologist: [name], MD  Chief Complaint:  Syncope, symptomatic bradycardia  Patient Profile:   Lee [name] is a 68 y.o. male with a history of DES mLAD 2009, DES LAD & RCA 2012, non-obs dz at cath 2015, nl EF echo 08/2018, Afib s/p ablation 2010, DM, HTN, HLD, GERD, COPD, syncope.  History of Present Illness:   Mr. Coin was hospitalized 04/22-4/23/2020 for syncope.  ECG was sinus bradycardia, rate not listed with a right bundle which is unchanged.  He later had some complete heart block on ECG.  His ECG improved in route to the hospital.  As the next day when his heart rate stayed normal.  Been on Coreg 12.5 mg, 2 tabs twice daily as well as Cartia XT 120 mg nightly.  The Cardia XT was discontinued and the Coreg was continued.  An event monitor was ordered.  Last night, he felt weak and tired.  He did not have any palpitations.  He did not have chest pain or shortness of breath.  The symptoms were intermittent.  In between the symptoms, he checked his blood pressure and does not remember an abnormal heart rate.  However, he was not symptomatic at the time he did this.  This a.m., he was still feeling weak and tired.  He needed to cut down the tree with a chainsaw.  He rested and felt a little better so he got out to do the work.  He had taken his morning medications as usual.  He was kneeling down, and cutting the last branch off the tree which was now down, when he felt extremely lightheaded and dizzy and woke up on the ground.  He relates that his wife told him he passed out twice, but he only remembers 1 episode.  EMS was called.  Upon EMS arrival, his ECG was complete heart block with a ventricular escape rhythm at 26 bpm.   He then progressed to a heart rate of 42, Mobitz 2 with a 2-1 them.  His heart rate improved and he is currently maintaining sinus rhythm.  He did not have chest pain or shortness of breath during any of this.  He is currently conscious and alert.  He has not had any other symptoms or concerns.  He stays active around the house and did not have any chest pain or shortness of breath well taking down the tree.  He walks for exercise with his wife, and is asymptomatic with this as well.  No palpitations at all.   Past Medical History:  Diagnosis Date   Allergic rhinitis    chronic sinusitis   Arthritis    CAD (coronary artery disease)    a. 2009 PCI/DES to mLAD; b. 05/2010 s/p DES RCA and LAD.; c. 05/2013 Cath: LAD mild ISR, LCX nl, RCA 40p, patent stents.   Cataract    has had lasik surg   COPD (chronic obstructive pulmonary disease) (HCC)    Diabetes mellitus type 2, controlled, without complications (HCC)    Diverticulosis    DJD (degenerative joint disease)    Esophagitis 1991   grade 1   GERD (gastroesophageal reflux disease)    Hiatal hernia   Hemorrhoids    Hyperlipidemia    Hypertensive heart disease    Iron deficiency anemia  Migraines    Paroxysmal atrial fibrillation (HCC)    a. s/p afib ablation 10/22/08 (J. Corneilus Heggie).   PVC's (premature ventricular contractions)    Sleep apnea    Questionalble: RDI during the total sleep time 6h 28 mins was 3.55/hr during REM sleep was at 5.71/hr. Supine AHI was 5.93/hr.   Syncope 08/2018    Past Surgical History:  Procedure Laterality Date   CARDIAC CATHETERIZATION  05/2010   The proximal LAD was then predilated with 2.0 x 12 trek. this was then stented with a 2.5 x 16 promus Element drug-eluting stent at 14 atmosphere and prostdilated with 2.75 x 12 Cascade trek at 16 atmospheres(2.8 mm) resulting the reduction of 80% proximal LAD stenosis to 0% residual with excellent flow.   CARDIAC CATHETERIZATION  06/05/2010     Successful percutaneous coronary intervention to the right coronary artery with percutaneous transluminal coronary angioplasty/stenting and insertion 3.0 x 16 mm Promus DES post dilated to 3.35 mm with stenosis being reduced to 0%   CARDIAC CATHETERIZATION  02/2008   Post dilatation was performed using a 2.75 x 9 [place] sprinter, 10 atmospheres for 40 seconds and then 9 atmosphere for 35 sec. This resulted in the 80% area of narrowing pre-intervention, now appearing to normal. There was no edvidence of the dissection or thrombus and there was TIMI III flow pre and post.   CARDIAC CATHETERIZATION  02/2006   CORONARY ANGIOPLASTY WITH STENT PLACEMENT     3 stents/ 4 caths   EYE SURGERY     KNEE ARTHROSCOPY     right   LASIK     LEFT HEART CATHETERIZATION WITH CORONARY ANGIOGRAM N/A 06/29/2013   Procedure: LEFT HEART CATHETERIZATION WITH CORONARY ANGIOGRAM;  Surgeon: Leonie Man, MD;  Location[facility] C[facility] LAB;  Service: Cardiovascular;  Laterality: N/A;   NASAL SINUS SURGERY  2001   NECK SURGERY     Cervical fusion   RADIOFREQUENCY ABLATION  May 2010   atrial fibrillation   ROTATOR CUFF REPAIR     left   SHOULDER OPEN ROTATOR CUFF REPAIR     right   SPINE SURGERY     WRIST ARTHROCENTESIS     left     Medications Prior to Admission: Prior to Admission medications   Medication Sig Start Date End Date Taking? Authorizing Provider  AIMOVIG 140 MG/ML SOAJ Inject 140 mg into the muscle every 30 (thirty) days. 08/21/18  Yes [provider]  amLODipine (NORVASC) 10 MG tablet Take 1 tablet (10 mg total) by mouth daily. 10/06/18  Yes Susy Frizzle, MD  aspirin 81 MG tablet Take 81 mg by mouth daily.   Yes [provider]  carvedilol (COREG) 12.5 MG tablet TAKE 2 TABLETS BY MOUTH 2  TIMES DAILY WITH A MEAL Patient taking differently: Take 25 mg by mouth 2 (two) times daily with a meal.  08/23/18  Yes Susy Frizzle, MD  cholecalciferol (VITAMIN D) 1000 UNITS  tablet Take 1,000 Units by mouth daily.   Yes [provider]  citalopram (CELEXA) 40 MG tablet TAKE 1 TABLET(40 MG) BY MOUTH DAILY Patient taking differently: Take 40 mg by mouth daily.  10/10/17  Yes Susy Frizzle, MD  Coenzyme Q10 (CO Q 10) 100 MG CAPS Take 1 capsule by mouth daily.   Yes [provider]  fexofenadine (ALLEGRA) 180 MG tablet Take 180 mg by mouth daily.    Yes [provider]  fluticasone (FLONASE) 50 MCG/ACT nasal spray INSTILL 2 SPRAYS  IN THE  NOSE AT BEDTIME Patient taking differently: Place 2 sprays into both nostrils at bedtime.  04/06/17  Yes Susy Frizzle, MD  furosemide (LASIX) 40 MG tablet TAKE 1 TABLET BY MOUTH  DAILY Patient taking differently: Take 40 mg by mouth daily.  04/14/18  Yes Troy Sine, MD  nitroGLYCERIN (NITROSTAT) 0.4 MG SL tablet PLACE ONE TABLET UNDER THE TONGUE EVERY 5 MINUTES AS NEEDED FOR CHEST PAIN Patient taking differently: Place 0.4 mg under the tongue every 5 (five) minutes as needed for chest pain.  11/22/16  Yes Troy Sine, MD  oxyCODONE-acetaminophen (PERCOCET) 10-325 MG tablet Take 1 tablet by mouth every 8 (eight) hours as needed for pain. 07/27/18  Yes Susy Frizzle, MD  pantoprazole (PROTONIX) 40 MG tablet TAKE 1 TABLET BY MOUTH  EVERY DAY Patient taking differently: Take 40 mg by mouth daily.  04/14/18  Yes Troy Sine, MD  potassium chloride (MICRO-K) 10 MEQ CR capsule TAKE 1 CAPSULE BY MOUTH  EVERY DAY Patient taking differently: Take 10 mEq by mouth daily.  04/14/18  Yes Troy Sine, MD  rosuvastatin (CRESTOR) 40 MG tablet TAKE 1 TABLET BY MOUTH  DAILY Patient taking differently: Take 40 mg by mouth daily.  09/25/18  Yes Troy Sine, MD  telmisartan (MICARDIS) 40 MG tablet Take 1 tablet (40 mg total) by mouth daily. 07/10/18  Yes Troy Sine, MD  traMADol (ULTRAM) 50 MG tablet TAKE 1 TABLET(50 MG) BY MOUTH EVERY 8 HOURS AS NEEDED Patient taking differently: Take 50 mg by  mouth every 8 (eight) hours as needed for moderate pain.  08/25/18  Yes Susy Frizzle, MD  zolpidem (AMBIEN) 10 MG tablet TAKE 1 TABLET(10 MG) BY MOUTH AT BEDTIME AS NEEDED FOR SLEEP Patient taking differently: Take 10 mg by mouth at bedtime.  03/07/18  Yes Susy Frizzle, MD     Allergies:    Allergies  Allergen Reactions   Ibuprofen Swelling   Other Shortness Of Breath    BETA BLOCKERS   Topamax [Topiramate] Other (See Comments)    Pt states it made his tongue feel "scalded" and he couldn't eat    Toprol Xl [Metoprolol Succinate] Other (See Comments)    Personality change   Metoprolol-Hctz Er Other (See Comments)    Indigestion,mouth raw    Social History:   Social History   Socioeconomic History   Marital status: Married    Spouse name: Not on file   Number of children: 2   Years of education: Not on file   Highest education level: Not on file  Occupational History   Occupation: retired  Scientist, product/process development strain: Not on file   Food insecurity:    Worry: Not on file    Inability: Not on file   Transportation needs:    Medical: Not on file    Non-medical: Not on file  Tobacco Use   Smoking status: Former Smoker    Last attempt to quit: 06/01/1987    Years since quitting: 31.3   Smokeless tobacco: Never Used  Substance and Sexual Activity   Alcohol use: Yes    Alcohol/week: 0.0 standard drinks    Comment: once every 6-7 months   Drug use: No   Sexual activity: Not on file  Lifestyle   Physical activity:    Days per week: Not on file    Minutes per session: Not on file   Stress: Not on file  Relationships  Social connections:    Talks on phone: Not on file    Gets together: Not on file    Attends religious service: Not on file    Active member of club or organization: Not on file    Attends meetings of clubs or organizations: Not on file    Relationship status: Not on file   Intimate partner violence:    Fear of  current or ex partner: Not on file    Emotionally abused: Not on file    Physically abused: Not on file    Forced sexual activity: Not on file  Other Topics Concern   Not on file  Social History Narrative   Not on file    Family History:   The patient's family history includes Colon cancer in his mother; Diabetes in his father; Heart disease in his father and sister; Uterine cancer in his mother.   The patient He indicated that his mother is deceased. He indicated that his father is alive. He indicated that the status of his sister is unknown.  ROS:  Please see the history of present illness.  All other ROS reviewed and negative.     Physical Exam/Data:   Vitals:   10/10/18 1455 10/10/18 1500 10/10/18 1504 10/10/18 1515  BP:   132/79 131/77  Pulse: 75 77 75 73  Resp: 16 19 13 16   Temp:      TempSrc:      SpO2: 97% 97% 99% 97%  Weight:      Height:       No intake or output data in the 24 hours ending 10/10/18 1641 Filed Weights   10/10/18 1410  Weight: 117.9 kg   Body mass index is 39.53 kg/m.  General:  Well nourished, well developed male, in no acute distress HEENT: normal Lymph: no adenopathy Neck:  JVD not elevated Endocrine:  No thryomegaly Vascular: No carotid bruits; 4/4 extremity pulses 2+ bilaterally Cardiac:  normal S1, S2; RRR; no murmur, no rub or gallop  Lungs:  clear to auscultation bilaterally, no wheezing, rhonchi or rales  Abd: soft, nontender, no hepatomegaly  Ext: No edema Musculoskeletal:  No deformities, BUE and BLE strength normal and equal Skin: warm and dry  Neuro:  CNs 2-12 intact, no focal abnormalities noted Psych:  Normal affect    EKG:  The ECG that was done 10/10/2018 was personally reviewed and demonstrates sinus rhythm, right bundle is old, PR interval 240 ms, heart rate 77  Relevant CV Studies:  ECHO: 09/20/2018  1. The left ventricle has normal systolic function with an ejection fraction of 60-65%. The cavity size was  normal. There is mild concentric left ventricular hypertrophy. Left ventricular diastolic Doppler parameters are consistent with  pseudonormalization. No evidence of left ventricular regional wall motion abnormalities.  2. The right ventricle has normal systolic function. The cavity was normal. There is no increase in right ventricular wall thickness.  3. Left atrial size was mildly dilated.  4. There is mild mitral annular calcification present.  5. The aortic valve is tricuspid. Mild sclerosis of the aortic valve.  CATH: 06/29/2013 Left Ventriculography:  EF: 65 %  Wall Motion: Normal Coronary Anatomy:  Left Main: Large-caliber vessel that bifurcates into the LAD, and Circumflex. Angiographically normal. LAD: Large-caliber vessel with patent stent proximally with a roughly 20% irregular in-stent re-stenosis. The mid stent also had tubular 40% in-stent restenosis. The remainder the vessel is relatively free of disease it wraps the apex.  D1: Small-caliber vessel,  jailed by the stent. No significant lesions.  Left Circumflex: Large-caliber vessel, angiographically normal. It actually courses as a large lateral OM. They're several small branches of the small AV groove branch  RCA: Large caliber, dominant vessel with a roughly 40% narrowing just proximal to the widely patent stent in the mid vessel the it then courses down around 2 terminates as The Right Posterior Descending Artery prior to giving off the Right Posterior AV Groove Branch (RPAV). Beyond the patent stent, no significant lesions noted.  RPDA: Moderate-caliber vessel that reaches almost to the apex. Tortuous but free of disease.  RPL Sysytem:The RPAV moderate caliber vessel that terminates as a single posterior lateral branches several small branches. Minimal luminal irregularity. PATIENT DISPOSITION:    The patient was transferred to the PACU holding area in a hemodynamicaly stable, chest pain free condition.  The  patient tolerated the procedure well, and there were no complications.  EBL:   < 10 ml  The patient was stable before, during, and after the procedure. POST-OPERATIVE DIAGNOSIS:    No angiographic evidence of significant CAD. If there is concern for follow on symptoms, would consider Myoview stress test to assess the LAD stent in stent restenosis.  Preserved EF with elevated EDP.   EP ABLATION: 10/22/2008  CONCLUSIONS:  1. Sinus rhythm upon presentation.  2. No inducible atrial tachycardias, atrial flutters, or AV nodal      reentrant tachycardia.  3. No evidence of accessory pathways.  4. The patient did have dual AV nodal physiology, but only single echo      beats with no AVNRT induced.  I therefore did not ablate the      patient's slow pathway.  5. Inducible atrial fibrillation.  6. Successful electrical isolation and anatomical encircling of all      four pulmonary veins using radiofrequency current.  7. Successful cardioversion to sinus rhythm.  8. No early apparent complications.  Laboratory Data:  Chemistry Recent Labs  Lab 10/06/18 0917 10/10/18 1413  NA 135 134*  K 5.0 5.1  CL 98 102  CO2 25 23  GLUCOSE 126* 176*  BUN 12 14  CREATININE 0.79 0.92  CALCIUM 9.3 8.6*  GFRNONAA  --  >60  GFRAA  --  >60  ANIONGAP  --  9    Recent Labs  Lab 10/06/18 0917 10/10/18 1413  PROT 6.4 6.1*  ALBUMIN  --  3.8  AST 19 20  ALT 18 24  ALKPHOS  --  98  BILITOT 0.4 0.6   Hematology Recent Labs  Lab 10/06/18 0917 10/10/18 1413  WBC 7.3 6.1  RBC 4.58 4.60  HGB 13.4 13.0  HCT 39.9 41.7  MCV 87.1 90.7  MCH 29.3 28.3  MCHC 33.6 31.2  RDW 11.9 12.3  PLT 245 207   Cardiac Enzymes Recent Labs  Lab 10/10/18 1413  TROPONINI <0.03     BNP Recent Labs  Lab 10/10/18 1413  BNP 31.8     Radiology/Studies:  Dg Chest Portable 1 View  Result Date: 10/10/2018 CLINICAL DATA:  Chest pain. EXAM: PORTABLE CHEST 1 VIEW COMPARISON:  09/20/2018 FINDINGS: Single view  of the chest was obtained. Electronic device overlying the mid chest is new. Again noted is a surgical plate in the lower cervical spine. The lungs are clear without pulmonary edema. Heart size is within normal limits and stable. Negative for a pneumothorax. No acute bone abnormality. IMPRESSION: No acute chest findings. Electronically Signed   By: Scherrie Gerlach.D.  On: 10/10/2018 14:33    Assessment and Plan:   Principal Problem:   Syncope and collapse Active Problems:   RBBB   Heart block AV complete ([place])    For questions or updates, please contact [facility] Please consult [email or web address] for contact info under Cardiology/STEMI.    Signed, Rosaria Ferries, PA-C  10/10/2018 4:41 PM     I have seen, examined the patient, and reviewed the above assessment and plan.  Changes to above are made where necessary.  On exam, RRR.  By ekg, the patient has chronic conduction system disease.  He now presents with recurrent syncope and transient complete heart block.  This is likely exacerbated by coreg 25mg  BID.  We will allow coreg to washout and follow closely.  If he has further AF block then he will require pacing.  I discussed at length with patient and his wife today. EF is preserved.  afib has been successfully ablated.  I will see patient again in am.  Co Sign: [name], MD 10/10/2018 10:25 PM

## 2018-10-10 NOTE — Progress Notes (Signed)
PHARMACIST - PHYSICIAN ORDER COMMUNICATION  CONCERNING: P&T Medication Policy on Herbal Medications  DESCRIPTION:  This patient's order for:  Co Q10  has been noted.  This product(s) is classified as an "herbal" or natural product. Due to a lack of definitive safety studies or FDA approval, nonstandard manufacturing practices, plus the potential risk of unknown drug-drug interactions while on inpatient medications, the Pharmacy and Therapeutics Committee does not permit the use of "herbal" or natural products of this type within [facility].   ACTION TAKEN: The pharmacy department is unable to verify this order at this time. Please reevaluate patient's clinical condition at discharge and address if the herbal or natural product(s) should be resumed at that time.  Arty Baumgartner, [place] Pager: [phone] 10/10/2018 9:51 PM

## 2018-10-10 NOTE — Telephone Encounter (Signed)
New Message   Crystal with [employer] is calling in with a critical EKG

## 2018-10-10 NOTE — Telephone Encounter (Signed)
Per patient's chart patient was sent to ED via EMS by cardiologist.

## 2018-10-11 DIAGNOSIS — R001 Bradycardia, unspecified: Secondary | ICD-10-CM | POA: Diagnosis not present

## 2018-10-11 DIAGNOSIS — I10 Essential (primary) hypertension: Secondary | ICD-10-CM | POA: Diagnosis not present

## 2018-10-11 DIAGNOSIS — R55 Syncope and collapse: Secondary | ICD-10-CM | POA: Diagnosis not present

## 2018-10-11 DIAGNOSIS — I2510 Atherosclerotic heart disease of native coronary artery without angina pectoris: Secondary | ICD-10-CM | POA: Diagnosis not present

## 2018-10-11 DIAGNOSIS — I4510 Unspecified right bundle-branch block: Secondary | ICD-10-CM | POA: Diagnosis not present

## 2018-10-11 DIAGNOSIS — I442 Atrioventricular block, complete: Secondary | ICD-10-CM | POA: Diagnosis not present

## 2018-10-11 DIAGNOSIS — Z1159 Encounter for screening for other viral diseases: Secondary | ICD-10-CM | POA: Diagnosis not present

## 2018-10-11 LAB — GLUCOSE, CAPILLARY
Glucose-Capillary: 129 mg/dL — ABNORMAL HIGH (ref 70–99)
Glucose-Capillary: 135 mg/dL — ABNORMAL HIGH (ref 70–99)
Glucose-Capillary: 154 mg/dL — ABNORMAL HIGH (ref 70–99)
Glucose-Capillary: 218 mg/dL — ABNORMAL HIGH (ref 70–99)

## 2018-10-11 LAB — BASIC METABOLIC PANEL
Anion gap: 12 (ref 5–15)
BUN: 13 mg/dL (ref 8–23)
CO2: 25 mmol/L (ref 22–32)
Calcium: 9.1 mg/dL (ref 8.9–10.3)
Chloride: 99 mmol/L (ref 98–111)
Creatinine, Ser: 0.77 mg/dL (ref 0.61–1.24)
GFR calc Af Amer: >60 mL/min (ref >60)
GFR calc non Af Amer: >60 mL/min (ref >60)
Glucose, Bld: 122 mg/dL — ABNORMAL HIGH (ref 70–99)
Potassium: 3.8 mmol/L (ref 3.5–5.1)
Sodium: 136 mmol/L (ref 135–145)

## 2018-10-11 LAB — TROPONIN I
Troponin I: <0.03 ng/mL (ref <0.03)
Troponin I: <0.03 ng/mL (ref <0.03)

## 2018-10-11 NOTE — Progress Notes (Signed)
Visit made to patients room set patient up on CPAP with full face mask, this it patients home regimen.. patient tolerating well.  RT will continue to monitor.

## 2018-10-11 NOTE — Care Management Obs Status (Signed)
[place] NOTIFICATION   Patient Details  Name: Lee [name] MRN: [id number] Date of Birth: [birth year]   Medicare Observation Status Notification Given:  Yes    Bethena Roys, RN 10/11/2018, 3:37 PM

## 2018-10-11 NOTE — Progress Notes (Signed)
ELECTROPHYSIOLOGY ROUNDING NOTE - TELEHEALTH      Electrophysiology TeleHealth Progress Note   Due to national recommendations of social distancing due to [place] 19, an Audio telehealth visit is felt to be most appropriate for this patient at this time.  Verbal patient consent was obtained for this visit today.  Date:  10/11/2018   ID:  Lee [name], Lee [name] Mar 15, 1951, MRN [id number]  Location:  [facility] Provider location: [facility] [place] Admit Date: 10/10/2018 Evaluation Performed: follow-up inpatient visit  Chief Complaint:  syncope   Subjective   Doing well today, the patient denies CP or SOB.  No new concerns  All systems are reviewed and negatives except as above (ROS)   Inpatient Medications    Scheduled Meds: . amLODipine  10 mg Oral Daily  . aspirin EC  81 mg Oral Daily  . cholecalciferol  1,000 Units Oral Daily  . citalopram  40 mg Oral Daily  . enoxaparin (LOVENOX) injection  40 mg Subcutaneous Q24H  . fluticasone  2 spray Each Nare QHS  . furosemide  40 mg Oral Daily  . insulin aspart  0-15 Units Subcutaneous TID WC  . insulin aspart  0-5 Units Subcutaneous QHS  . irbesartan  150 mg Oral Daily  . loratadine  10 mg Oral Daily  . pantoprazole  40 mg Oral Daily  . potassium chloride  10 mEq Oral Daily  . rosuvastatin  40 mg Oral Daily   Continuous Infusions:  PRN Meds: acetaminophen, ALPRAZolam, nitroGLYCERIN, ondansetron (ZOFRAN) IV, oxyCODONE-acetaminophen **AND** oxyCODONE, traMADol, zolpidem   Past Medical Hx, Surgical Hx, Allergies, Social hx, Family hx:  previously reviewed at length with the patient.  Reviewed.  No changes from prior note   Exam:   Telemetry: sinus rhythm, no AV block Vitals:   10/11/18 0000 10/11/18 0433 10/11/18 0821 10/11/18 0856  BP:  138/68 135/87 135/83  Pulse:  67 67   Resp:      Temp:  98.1 F (36.7 C) 98.4 F (36.9 C)   TempSrc:  Oral Oral   SpO2: 96% 92% 96%   Weight:  117.5 kg    Height:         Well sounding Due to risks of coronavirus spread, it is felt to be best to not further examine the patient.  A limited virtual exam is also felt to be appropriate in maintaining adequate PPE supplies for our hospital.   Labs/Other Tests and Data Reviewed:    Recent Labs: Lab Results  Component Value Date   WBC 6.1 10/10/2018   HGB 13.0 10/10/2018   HCT 41.7 10/10/2018   MCV 90.7 10/10/2018   PLT 207 10/10/2018    Recent Labs  Lab 10/10/18 1413  10/11/18 0745  NA 134*  --  136  K 5.1  --  3.8  CL 102  --  99  CO2 23  --  25  BUN 14  --  13  CREATININE 0.92   < > 0.77  CALCIUM 8.6*  --  9.1  PROT 6.1*  --   --   BILITOT 0.6  --   --   ALKPHOS 98  --   --   ALT 24  --   --   AST 20  --   --   GLUCOSE 176*  --  122*   < > = values in this interval not displayed.   @RESUFAST (CKTOTAL,CKMB,CKMBINDEX,TROPONINI)  EKG-  sinus rhythm with PR 236 msec, RBBB  Echo: 09/20/2018-  EF 60%, LVH (mild)  Other studies personally reviewed: Additional studies/ records that were reviewed today include: labs, vitals, telemetry  Review of the above records today demonstrates: as above  ASSESSMENT & PLAN:    1. Transient complete heart block with syncope He has evidence of chronic conduction system disease by ekg. Likely worsened by coreg No further AV block during coreg washout thus far. I will observe on telemetry for another 24 hours. If ANY further AV block or symptoms of presyncope, syncope, dizziness, I would advise that we proceed with PPM in am.  Otherwise, we will discharge tomorrow with close outpatient follow-up.  2. afib Resolved post ablation 2010  3.  HTN He worries about his BP off of coreg Increase irbesartan to 300mg  daily if BP elevated further  Patient Risk:  after full review of this patients clinical status, I feel that they are at moderate risk at this time.  Today, I have spent 20 minutes with the patient with telehealth technology discussing AV block .     SignedThompson Grayer MD, [place] 10/11/2018 9:09 AM   [facility] [address] [place] [address] [phone] (office) [phone] (fax)

## 2018-10-12 DIAGNOSIS — I2510 Atherosclerotic heart disease of native coronary artery without angina pectoris: Secondary | ICD-10-CM | POA: Diagnosis not present

## 2018-10-12 DIAGNOSIS — R55 Syncope and collapse: Secondary | ICD-10-CM | POA: Diagnosis not present

## 2018-10-12 DIAGNOSIS — Z1159 Encounter for screening for other viral diseases: Secondary | ICD-10-CM | POA: Diagnosis not present

## 2018-10-12 DIAGNOSIS — R001 Bradycardia, unspecified: Secondary | ICD-10-CM | POA: Diagnosis not present

## 2018-10-12 DIAGNOSIS — I442 Atrioventricular block, complete: Secondary | ICD-10-CM | POA: Diagnosis not present

## 2018-10-12 DIAGNOSIS — I10 Essential (primary) hypertension: Secondary | ICD-10-CM | POA: Diagnosis not present

## 2018-10-12 LAB — GLUCOSE, CAPILLARY
Glucose-Capillary: 126 mg/dL — ABNORMAL HIGH (ref 70–99)
Glucose-Capillary: 170 mg/dL — ABNORMAL HIGH (ref 70–99)

## 2018-10-12 NOTE — Discharge Summary (Addendum)
ELECTROPHYSIOLOGY PROCEDURE DISCHARGE SUMMARY    Patient ID: Lee [name],  MRN: [id number], DOB/AGE: March 28, 1951 68 y.o.  Admit date: 10/10/2018 Discharge date: 10/12/2018  Primary Care Physician: Susy Frizzle, MD  Primary Cardiologist: Dr. Claiborne Billings Electrophysiologist: Dr. Rayann Heman  Primary Discharge Diagnosis:  1. Syncope 2. CHB  Secondary Discharge Diagnosis:  1. CAD     (PCI 2009, 2012) 2. HTN 3. AFib     Ablated 2010, no know recurrence     Off a/c 4. OSA     CPAP 5. RBBB  Allergies  Allergen Reactions   Ibuprofen Swelling   Other Shortness Of Breath    BETA BLOCKERS   Topamax [Topiramate] Other (See Comments)    Pt states it made his tongue feel "scalded" and he couldn't eat    Toprol Xl [Metoprolol Succinate] Other (See Comments)    Personality change   Metoprolol-Hctz Er Other (See Comments)    Indigestion,mouth raw     Procedures This Admission:  none  Brief HPI: Lee [name] is a 68 y.o. male with PMHx including above, was recently hospitalized in April with syncope, suspect to be a vagal episode, SB was noted and his diltiazem was stopped, he was discharged home with plans for monitoring, on his coreg.  He was admitted 10/10/2018, with recurrent syncope and by chart record associated pauses on his monitor, chart note "Received a call from [employer] for a critical monitor report. She report a 36 sec strip showing multiple pauses lasting 6, 7, 9, and 12 seconds".   EMS found the patient initially in a CHB with V rate 20's that improved to 2:1, and eventually SR.  Hospital Course:  The patient was admitted his home coreg stopped.  He voiced no anginal sounding symptoms, no CP or SOB.  Labs were unremarkable, TSH wnl, Trop neg x4.  He was monitored on telemetry throughout his stay and coreg washout completed.  He has 1st degree AVBlock, no other heart block, no bradycardiac events or arrhythmias.  He has felt well during his hospitalization,  no recurrent episodes or symptoms.    The patient was examined by Dr. Rayann Heman and considered stable for discharge to home.   No AV nodal blocking agents going forward Pt was instructed and made aware of [place] law, no driving 6 months  Any recurrent syncope will warrant PPM implantation He will continue to wear the event monitor to completion (discussed with the patient, he is aware) Follow up is in place    Physical Exam: Vitals:   10/11/18 1920 10/11/18 2211 10/12/18 0557 10/12/18 0932  BP: (!) 148/83  (!) 140/92 (!) 164/84  Pulse: 72 82 69   Resp: 15 16 15    Temp: 97.7 F (36.5 C)  98.4 F (36.9 C)   TempSrc: Oral  Oral   SpO2: 93% 96% 97%   Weight:   117.8 kg   Height:        GEN- The patient is well appearing, alert and oriented x 3 today.   HEENT: normocephalic, atraumatic; sclera clear, conjunctiva pink; hearing intact neck supple, no JVP Lungs- CTA b/l, normal work of breathing.  No wheezes, rales, rhonchi Heart- RRR, no murmurs, rubs or gallops, PMI not laterally displaced GI- not examined Extremities- no clubbing, cyanosis, or edema MS- no significant deformity or atrophy Skin- warm and dry, no rash or lesion Psych- euthymic mood, full affect Neuro- no gross focal deficits noted   Labs:   Lab Results  Component Value Date   WBC 6.1 10/10/2018   HGB 13.0 10/10/2018   HCT 41.7 10/10/2018   MCV 90.7 10/10/2018   PLT 207 10/10/2018    Recent Labs  Lab 10/10/18 1413  10/11/18 0745  NA 134*  --  136  K 5.1  --  3.8  CL 102  --  99  CO2 23  --  25  BUN 14  --  13  CREATININE 0.92   < > 0.77  CALCIUM 8.6*  --  9.1  PROT 6.1*  --   --   BILITOT 0.6  --   --   ALKPHOS 98  --   --   ALT 24  --   --   AST 20  --   --   GLUCOSE 176*  --  122*   < > = values in this interval not displayed.    Discharge Medications:  Allergies as of 10/12/2018      Reactions   Ibuprofen Swelling   Other Shortness Of Breath   BETA BLOCKERS   Topamax [topiramate]  Other (See Comments)   Pt states it made his tongue feel "scalded" and he couldn't eat    Toprol Xl [metoprolol Succinate] Other (See Comments)   Personality change   Metoprolol-hctz Er Other (See Comments)   Indigestion,mouth raw      Medication List    TAKE these medications   Aimovig 140 MG/ML [place] Generic drug:  Erenumab-aooe Inject 140 mg into the muscle every 30 (thirty) days.   amLODipine 10 MG tablet Commonly known as:  NORVASC Take 1 tablet (10 mg total) by mouth daily.   aspirin 81 MG tablet Take 81 mg by mouth daily.   cholecalciferol 1000 units tablet Commonly known as:  VITAMIN D Take 1,000 Units by mouth daily.   citalopram 40 MG tablet Commonly known as:  CELEXA TAKE 1 TABLET(40 MG) BY MOUTH DAILY What changed:  See the new instructions.   Co Q 10 100 MG Caps Take 1 capsule by mouth daily.   fexofenadine 180 MG tablet Commonly known as:  ALLEGRA Take 180 mg by mouth daily.   fluticasone 50 MCG/ACT nasal spray Commonly known as:  FLONASE INSTILL 2 SPRAYS IN THE  NOSE AT BEDTIME What changed:  See the new instructions.   furosemide 40 MG tablet Commonly known as:  LASIX TAKE 1 TABLET BY MOUTH  DAILY   nitroGLYCERIN 0.4 MG SL tablet Commonly known as:  NITROSTAT PLACE ONE TABLET UNDER THE TONGUE EVERY 5 MINUTES AS NEEDED FOR CHEST PAIN What changed:  See the new instructions.   oxyCODONE-acetaminophen 10-325 MG tablet Commonly known as:  PERCOCET Take 1 tablet by mouth every 8 (eight) hours as needed for pain.   pantoprazole 40 MG tablet Commonly known as:  PROTONIX TAKE 1 TABLET BY MOUTH  EVERY DAY   potassium chloride 10 MEQ CR capsule Commonly known as:  MICRO-K TAKE 1 CAPSULE BY MOUTH  EVERY DAY What changed:  how much to take   rosuvastatin 40 MG tablet Commonly known as:  CRESTOR TAKE 1 TABLET BY MOUTH  DAILY   telmisartan 40 MG tablet Commonly known as:  MICARDIS Take 1 tablet (40 mg total) by mouth daily.   traMADol 50 MG  tablet Commonly known as:  ULTRAM TAKE 1 TABLET(50 MG) BY MOUTH EVERY 8 HOURS AS NEEDED What changed:  See the new instructions.   zolpidem 10 MG tablet Commonly known as:  AMBIEN TAKE 1  TABLET(10 MG) BY MOUTH AT BEDTIME AS NEEDED FOR SLEEP What changed:  See the new instructions.       Disposition:  Home Discharge Instructions    Diet - low sodium heart healthy   Complete by:  As directed    Increase activity slowly   Complete by:  As directed      Follow-up Information    [name], MD Follow up.   Specialty:  Cardiology Why:  11/16/2018 @ 9:15AM, this is currently scheduled as an in-clinic visit.  Given COVID 19, this may be converted to a virtual visit, you will be notified if so. Contact information: [address] [place] [address] [phone]           Duration of Discharge Encounter: Greater than 30 minutes including physician time.  Signed, Tommye Standard, PA-C 10/12/2018 11:36 AM  I have seen, examined the patient, and reviewed the above assessment and plan.  Changes to above are made where necessary.  On exam, RRR.  Pt admitted with transient complete heart block and syncope in the setting of coreg 25mg  BID.  Coreg was discontinued and he was observed to have no further AV block or symptoms of bradycardia. He is discharged to home.  Continue to wear 30 day monitor.  No driving x 6 months (pt aware after being informed personally by me).  If he has any further syncope or symptoms of bradycardia off AV nodal agents, I would advise that we proceed with PPM implantation at that time.  DC to home Follow-up with me in 4 weeks  Co Sign: [name], MD 10/12/2018 12:13 PM

## 2018-10-12 NOTE — Discharge Instructions (Signed)
1. STOP carvedilol (Coreg)  2. NO DRIVING 6 MONTHS  3. Please do not participate in any high risk activities such as climbing ladders, use of power tools like your chainsaw as we discussed     PLEASE SIGN UP TO YOUR MY CHART ACCOUNT WHEN YOU GET HOME (if you aren't already).  IN THE CURRENT ENVIRONMENT WITH COVID-19, IN EFFORT TO REDUCE YOUR EXPOSURE WE WILL BE CONDUCTING MANY PATIENT VISITS BY EITHER VIRTUAL/VIDEO VISITS or TELEPHONE VISITS.  BEING SIGNED UP IN YOUR MY CHART ACCOUNT  WILL HELP FACILITATE THESE VISITS AND OUR COMMUNICATION WITH YOU    YOUR CARDIOLOGY TEAM MAY CHANGE YOUR FOLLOW UP TO AN E-VISIT FOR YOUR APPOINTMENT - PLEASE REVIEW IMPORTANT INFORMATION BELOW SEVERAL DAYS PRIOR TO YOUR APPOINTMENT  Due to the recent COVID-19 pandemic, we are transitioning in-person office visits to tele-medicine visits in an effort to decrease unnecessary exposure to our patients, their families, and staff. These visits are billed to your insurance just like a normal visit is. We also encourage you to sign up for MyChart if you have not already done so. You will need a smartphone if possible. For patients that do not have this, we can still complete the visit using a regular telephone but do prefer a smartphone to enable video when possible. You may have a family member that lives with you that can help. If possible, we also ask that you have a blood pressure cuff and scale at home to measure your blood pressure, heart rate and weight prior to your scheduled appointment. Patients with clinical needs that need an in-person evaluation and testing will still be able to come to the office if absolutely necessary. If you have any questions, feel free to call our office.     YOUR PROVIDER WILL BE USING THE FOLLOWING PLATFORM TO COMPLETE YOUR VISIT: [place]   IF USING [place] - How to Download the MyChart App to Your SmartPhone   - If Apple, go to [employer] and type in MyChart in the search bar and  download the app. If Android, ask patient to go to [employer] and type in [place] in the search bar and download the app. The app is free but as with any other app downloads, your phone may require you to verify saved payment information or Apple/Android password.  - You will need to then log into the app with your MyChart username and password, and select [facility] as your healthcare provider to link the account.  - When it is time for your visit, go to the MyChart app, find appointments, and click Begin Video Visit. Be sure to Select Allow for your device to access the Microphone and Camera for your visit. You will then be connected, and your provider will be with you shortly.  **If you have any issues connecting or need assistance, please contact MyChart service desk [phone])**  **If using a computer, in order to ensure the best quality for your visit, you will need to use either of the following Internet Browsers: [place] representative**     2-3 [place] will receive a telephone call from one of our [place] team members - your caller ID may say "Unknown caller." If this is a video visit, we will walk you through how to get the video launched on your phone. We will remind you check your blood pressure, heart rate and weight prior to your scheduled appointment. If you have  an Advertising account executive, please upload any pertinent ECG strips the day before or morning of your appointment to [place]. Our staff will also make sure you have reviewed the consent and agree to move forward with your scheduled tele-health visit.     THE DAY OF YOUR APPOINTMENT  Approximately 15 minutes prior to your scheduled appointment, you will receive a telephone call from one of [place] team - your caller ID may say "Unknown caller."  Our staff will confirm medications, vital signs for the day and any symptoms you may be experiencing. Please have this  information available prior to the time of visit start. It may also be helpful for you to have a pad of paper and pen handy for any instructions given during your visit. They will also walk you through joining the smartphone meeting if this is a video visit.    CONSENT FOR TELE-HEALTH VISIT - PLEASE REVIEW  I hereby voluntarily request, consent and authorize [facility] and its employed or contracted physicians, physician assistants, nurse practitioners or other licensed health care professionals (the Practitioner), to provide me with telemedicine health care services (the Services") as deemed necessary by the treating Practitioner. I acknowledge and consent to receive the Services by the Practitioner via telemedicine. I understand that the telemedicine visit will involve communicating with the Practitioner through live audiovisual communication technology and the disclosure of certain medical information by electronic transmission. I acknowledge that I have been given the opportunity to request an in-person assessment or other available alternative prior to the telemedicine visit and am voluntarily participating in the telemedicine visit.  I understand that I have the right to withhold or withdraw my consent to the use of telemedicine in the course of my care at any time, without affecting my right to future care or treatment, and that the Practitioner or I may terminate the telemedicine visit at any time. I understand that I have the right to inspect all information obtained and/or recorded in the course of the telemedicine visit and may receive copies of available information for a reasonable fee.  I understand that some of the potential risks of receiving the Services via telemedicine include:   Delay or interruption in medical evaluation due to technological equipment failure or disruption;  Information transmitted may not be sufficient (e.g. poor resolution of images) to allow for appropriate  medical decision making by the Practitioner; and/or   In rare instances, security protocols could fail, causing a breach of personal health information.  Furthermore, I acknowledge that it is my responsibility to provide information about my medical history, conditions and care that is complete and accurate to the best of my ability. I acknowledge that Practitioner's advice, recommendations, and/or decision may be based on factors not within their control, such as incomplete or inaccurate data provided by me or distortions of diagnostic images or specimens that may result from electronic transmissions. I understand that the practice of medicine is not an exact science and that Practitioner makes no warranties or guarantees regarding treatment outcomes. I acknowledge that I will receive a copy of this consent concurrently upon execution via email to the email address I last provided but may also request a printed copy by calling the office of [facility].    I understand that my insurance will be billed for this visit.   I have read or had this consent read to me.  I understand the contents of this consent, which adequately explains the benefits and risks of  the Services being provided via telemedicine.   I have been provided ample opportunity to ask questions regarding this consent and the Services and have had my questions answered to my satisfaction.  I give my informed consent for the services to be provided through the use of telemedicine in my medical care  By participating in this telemedicine visit I agree to the above.

## 2018-10-24 ENCOUNTER — Other Ambulatory Visit: Payer: Self-pay

## 2018-10-26 ENCOUNTER — Other Ambulatory Visit: Payer: Self-pay

## 2018-10-26 ENCOUNTER — Encounter: Payer: Self-pay | Admitting: Podiatry

## 2018-10-26 ENCOUNTER — Ambulatory Visit (INDEPENDENT_AMBULATORY_CARE_PROVIDER_SITE_OTHER): Payer: Medicare Other | Admitting: Podiatry

## 2018-10-26 VITALS — Temp 97.3°F

## 2018-10-26 DIAGNOSIS — Q828 Other specified congenital malformations of skin: Secondary | ICD-10-CM | POA: Diagnosis not present

## 2018-10-26 NOTE — Progress Notes (Signed)
He presents today chief complaint of painful calluses sub-fifth bilaterally.  He states that going to the beach in a couple weeks and I do not want this to be hurting while walking in the sand.  Objective: Vital signs are stable he is alert and oriented x3.  There is no erythema edema cellulitis drainage odor pulses remain palpable.  Neurologic sensorium is intact.  Plantar aspect of the fifth metatarsal heads bilaterally demonstrates reactive hyperkeratotic lesion with a centrally located porokeratotic lesion.  No open lesions or wounds are noted.  Assessment: Pain in limb secondary to porokeratotic lesion plantar aspect of the fifth metatarsals bilaterally.  Plan: Debrided reactive hyperkeratotic tissue enucleating the lesions.  I offered chemical destruction he declined.  I will follow-up with him as needed.

## 2018-11-04 ENCOUNTER — Other Ambulatory Visit: Payer: Self-pay | Admitting: Family Medicine

## 2018-11-15 ENCOUNTER — Telehealth: Payer: Self-pay

## 2018-11-15 NOTE — Telephone Encounter (Signed)
Spoke with pt regarding covid-19 screening prior to appt. Pt stated he has not been in contact with anyone who may have covid-19 and has no symptoms. 

## 2018-11-16 ENCOUNTER — Ambulatory Visit (INDEPENDENT_AMBULATORY_CARE_PROVIDER_SITE_OTHER): Payer: Medicare Other | Admitting: Internal Medicine

## 2018-11-16 ENCOUNTER — Encounter: Payer: Self-pay | Admitting: Internal Medicine

## 2018-11-16 ENCOUNTER — Other Ambulatory Visit: Payer: Self-pay

## 2018-11-16 VITALS — BP 124/76 | HR 79 | Ht 68.0 in | Wt 263.8 lb

## 2018-11-16 DIAGNOSIS — I10 Essential (primary) hypertension: Secondary | ICD-10-CM

## 2018-11-16 DIAGNOSIS — R55 Syncope and collapse: Secondary | ICD-10-CM | POA: Diagnosis not present

## 2018-11-16 DIAGNOSIS — I4510 Unspecified right bundle-branch block: Secondary | ICD-10-CM

## 2018-11-16 DIAGNOSIS — I442 Atrioventricular block, complete: Secondary | ICD-10-CM

## 2018-11-16 DIAGNOSIS — I480 Paroxysmal atrial fibrillation: Secondary | ICD-10-CM

## 2018-11-16 NOTE — Progress Notes (Signed)
PCP: Susy Frizzle, MD Primary Cardiologist: [name] Primary EP: Dr [name] Lee [name] is a 68 y.o. male who presents today for routine electrophysiology followup.  Since his recent hospitalization, the patient reports doing very well. No further symptoms of bradycardia.  Today, he denies symptoms of palpitations, chest pain, shortness of breath,  lower extremity edema, dizziness, presyncope, or syncope.  The patient is otherwise without complaint today.   Past Medical History:  Diagnosis Date  . Allergic rhinitis    chronic sinusitis  . Arthritis   . CAD (coronary artery disease)    a. 2009 PCI/DES to mLAD; b. 05/2010 s/p DES RCA and LAD.; c. 05/2013 Cath: LAD mild ISR, LCX nl, RCA 40p, patent stents.  . Cataract    has had lasik surg  . COPD (chronic obstructive pulmonary disease) ([facility])   . Diabetes mellitus type 2, controlled, without complications ([place])   . Diverticulosis   . DJD (degenerative joint disease)   . Esophagitis 1991   grade 1  . GERD (gastroesophageal reflux disease)    Hiatal hernia  . Hemorrhoids   . Hyperlipidemia   . Hypertensive heart disease   . Iron deficiency anemia   . Migraines   . Paroxysmal atrial fibrillation (HCC)    a. s/p afib ablation 10/22/08 (J. Lucilia Yanni).  . PVC's (premature ventricular contractions)   . Sleep apnea    Questionalble: RDI during the total sleep time 6h 28 mins was 3.55/hr during REM sleep was at 5.71/hr. Supine AHI was 5.93/hr.  . Syncope 08/2018   Past Surgical History:  Procedure Laterality Date  . CARDIAC CATHETERIZATION  05/2010   The proximal LAD was then predilated with 2.0 x 12 trek. this was then stented with a 2.5 x 16 promus Element drug-eluting stent at 14 atmosphere and prostdilated with 2.75 x 12 [place] trek at 16 atmospheres(2.8 mm) resulting the reduction of 80% proximal LAD stenosis to 0% residual with excellent flow.  Marland Kitchen CARDIAC CATHETERIZATION  06/05/2010   Successful percutaneous coronary  intervention to the right coronary artery with percutaneous transluminal coronary angioplasty/stenting and insertion 3.0 x 16 mm Promus DES post dilated to 3.35 mm with stenosis being reduced to 0%  . CARDIAC CATHETERIZATION  02/2008   Post dilatation was performed using a 2.75 x 9 [place] sprinter, 10 atmospheres for 40 seconds and then 9 atmosphere for 35 sec. This resulted in the 80% area of narrowing pre-intervention, now appearing to normal. There was no edvidence of the dissection or thrombus and there was TIMI III flow pre and post.  . CARDIAC CATHETERIZATION  02/2006  . CORONARY ANGIOPLASTY WITH STENT PLACEMENT     3 stents/ 4 caths  . EYE SURGERY    . KNEE ARTHROSCOPY     right  . LASIK    . LEFT HEART CATHETERIZATION WITH CORONARY ANGIOGRAM N/A 06/29/2013   Procedure: LEFT HEART CATHETERIZATION WITH CORONARY ANGIOGRAM;  Surgeon: Leonie Man, MD;  [facility];  Service: Cardiovascular;  Laterality: N/A;  . NASAL SINUS SURGERY  2001  . NECK SURGERY     Cervical fusion  . RADIOFREQUENCY ABLATION  May 2010   atrial fibrillation  . ROTATOR CUFF REPAIR     left  . SHOULDER OPEN ROTATOR CUFF REPAIR     right  . SPINE SURGERY    . WRIST ARTHROCENTESIS     left    ROS- all systems are reviewed and negatives except as per HPI above  Current Outpatient Medications  Medication Sig Dispense Refill  . AIMOVIG 140 MG/ML SOAJ Inject 140 mg into the muscle every 30 (thirty) days.    Marland Kitchen amLODipine (NORVASC) 10 MG tablet Take 1 tablet (10 mg total) by mouth daily. 90 tablet 3  . aspirin 81 MG tablet Take 81 mg by mouth daily.    . cholecalciferol (VITAMIN D) 1000 UNITS tablet Take 1,000 Units by mouth daily.    . citalopram (CELEXA) 40 MG tablet TAKE 1 TABLET(40 MG) BY MOUTH DAILY 90 tablet 3  . Coenzyme Q10 (CO Q 10) 100 MG CAPS Take 1 capsule by mouth daily.    . fexofenadine (ALLEGRA) 180 MG tablet Take 180 mg by mouth daily.     . fluticasone (FLONASE) 50 MCG/ACT nasal spray  INSTILL 2 SPRAYS IN THE  NOSE AT BEDTIME (Patient taking differently: Place 2 sprays into both nostrils at bedtime. ) 48 g 3  . furosemide (LASIX) 40 MG tablet TAKE 1 TABLET BY MOUTH  DAILY (Patient taking differently: Take 40 mg by mouth daily. ) 90 tablet 3  . nitroGLYCERIN (NITROSTAT) 0.4 MG SL tablet PLACE ONE TABLET UNDER THE TONGUE EVERY 5 MINUTES AS NEEDED FOR CHEST PAIN (Patient taking differently: Place 0.4 mg under the tongue every 5 (five) minutes as needed for chest pain. ) 25 tablet 3  . oxyCODONE-acetaminophen (PERCOCET) 10-325 MG tablet Take 1 tablet by mouth every 8 (eight) hours as needed for pain. 21 tablet 0  . pantoprazole (PROTONIX) 40 MG tablet TAKE 1 TABLET BY MOUTH  EVERY DAY (Patient taking differently: Take 40 mg by mouth daily. ) 90 tablet 3  . potassium chloride (MICRO-K) 10 MEQ CR capsule TAKE 1 CAPSULE BY MOUTH  EVERY DAY (Patient taking differently: Take 10 mEq by mouth daily. ) 90 capsule 3  . rosuvastatin (CRESTOR) 40 MG tablet TAKE 1 TABLET BY MOUTH  DAILY (Patient taking differently: Take 40 mg by mouth daily. ) 90 tablet 0  . telmisartan (MICARDIS) 40 MG tablet Take 1 tablet (40 mg total) by mouth daily. 90 tablet 1  . traMADol (ULTRAM) 50 MG tablet TAKE 1 TABLET(50 MG) BY MOUTH EVERY 8 HOURS AS NEEDED (Patient taking differently: Take 50 mg by mouth every 8 (eight) hours as needed for moderate pain. ) 30 tablet 0  . zolpidem (AMBIEN) 10 MG tablet TAKE 1 TABLET(10 MG) BY MOUTH AT BEDTIME AS NEEDED FOR SLEEP (Patient taking differently: Take 10 mg by mouth at bedtime. ) 30 tablet 0   No current facility-administered medications for this visit.     Physical Exam: Vitals:   11/16/18 0918  BP: 124/76  Pulse: 79  SpO2: 97%  Weight: 263 lb 12.8 oz (119.7 kg)  Height: 5\' 8"  (1.727 m)    GEN- The patient is well appearing, alert and oriented x 3 today.   Head- normocephalic, atraumatic Eyes-  Sclera clear, conjunctiva pink Ears- hearing intact Oropharynx- clear  Lungs- Clear to ausculation bilaterally, normal work of breathing Heart- Regular rate and rhythm, no murmurs, rubs or gallops, PMI not laterally displaced GI- soft, NT, ND, + BS Extremities- no clubbing, cyanosis, or edema  Wt Readings from Last 3 Encounters:  11/16/18 263 lb 12.8 oz (119.7 kg)  10/12/18 259 lb 9.6 oz (117.8 kg)  10/03/18 269 lb (122 kg)    EKG tracing ordered today is personally reviewed and shows sinus rhythm, first degree AV block, RBBB  30 day monitor personally reviewed in its entirety  Assessment and Plan:  1. Transient complete heart block No recurrence of symptoms off of coreg No driving x 6 months (pt aware)  2. afib Resolved post ablation  3. HTN Stable No change required today  4. Obesity Lifestyle modification encouraged  Follow-up with [name] as scheduled Return to see me in 6 months   [name] MD, [facility] 11/16/2018 1:59 PM

## 2018-11-16 NOTE — Patient Instructions (Addendum)
Medication Instructions:  Your physician recommends that you continue on your current medications as directed. Please refer to the Current Medication list given to you today.   Labwork: None ordered   Testing/Procedures: None ordered   Follow-Up: Your physician wants you to follow-up in: 6 months with Amber Seiler, NP You will receive a reminder letter in the mail two months in advance. If you don't receive a letter, please call our office to schedule the follow-up appointment.   Any Other Special Instructions Will Be Listed Below (If Applicable).     If you need a refill on your cardiac medications before your next appointment, please call your pharmacy.   

## 2018-11-22 ENCOUNTER — Other Ambulatory Visit: Payer: Self-pay | Admitting: Family Medicine

## 2018-11-22 NOTE — Telephone Encounter (Signed)
Refill on oxycodone to [facility]

## 2018-11-22 NOTE — Telephone Encounter (Signed)
Patient requesting a refill on Oxycodone     LOV: 10/03/18  LRF:   07/27/18

## 2018-11-23 MED ORDER — OXYCODONE-ACETAMINOPHEN 10-325 MG PO TABS: 1.0000 | ORAL_TABLET | Freq: Three times a day (TID) | ORAL | 0 refills | Status: DC | PRN

## 2018-11-27 ENCOUNTER — Other Ambulatory Visit: Payer: Self-pay | Admitting: Family Medicine

## 2018-11-28 NOTE — Telephone Encounter (Signed)
Requested Prescriptions   Pending Prescriptions Disp Refills  . traMADol (ULTRAM) 50 MG tablet [Pharmacy Med Name: TRAMADOL 50MG  TABLETS] 30 tablet     Sig: TAKE 1 TABLET(50 MG) BY MOUTH EVERY 8 HOURS AS NEEDED    Last OV 10/03/2018 Last written 08/25/2018

## 2018-12-07 ENCOUNTER — Ambulatory Visit (INDEPENDENT_AMBULATORY_CARE_PROVIDER_SITE_OTHER): Payer: Medicare Other | Admitting: Family Medicine

## 2018-12-07 ENCOUNTER — Encounter: Payer: Self-pay | Admitting: Family Medicine

## 2018-12-07 ENCOUNTER — Other Ambulatory Visit: Payer: Self-pay

## 2018-12-07 VITALS — BP 138/80 | HR 83 | Temp 98.0°F | Resp 18 | Ht 68.0 in | Wt 265.4 lb

## 2018-12-07 DIAGNOSIS — Z23 Encounter for immunization: Secondary | ICD-10-CM | POA: Diagnosis not present

## 2018-12-07 DIAGNOSIS — S50811A Abrasion of right forearm, initial encounter: Secondary | ICD-10-CM

## 2018-12-07 DIAGNOSIS — W19XXXA Unspecified fall, initial encounter: Secondary | ICD-10-CM

## 2018-12-07 DIAGNOSIS — S51031A Puncture wound without foreign body of right elbow, initial encounter: Secondary | ICD-10-CM | POA: Diagnosis not present

## 2018-12-07 MED ORDER — SULFAMETHOXAZOLE-TRIMETHOPRIM 800-160 MG PO TABS: 1.0000 | ORAL_TABLET | Freq: Two times a day (BID) | ORAL | 0 refills | Status: AC

## 2018-12-07 NOTE — Progress Notes (Signed)
Patient ID: Lee [name], male    DOB: [birth year], 68 y.o.   MRN: [id number]  PCP: Susy Frizzle, MD  Chief Complaint  Patient presents with  . Fall    hurt R elbow, 07/07, neosporin, OTC meds    Subjective:   Lee [name] is a 68 y.o. male, presents to clinic with CC of fall with a puncture wound and abrasion to his right arm and elbow.  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  1 of the spikes punctured his elbow and he had several abrasions to his forearm and right wrist.  He denies any other injuries at that time.  He comes here today because concerned that the wounds are really the puncture wound and wanting to update his tetanus.  He does have soreness in the elbow and radiating down the forearm he has been caring for the wounds with bacitracin and bandages.  His pain has gradually improved and is very mild right now.  He has had no purulent drainage, swelling or spreading redness.  He has no immunocompromise.  No history of poor wound healing or MRSA   Patient Active Problem List   Diagnosis Date Noted  . Syncope and collapse 10/10/2018  . Heart block AV complete ([place]) 10/10/2018  . Symptomatic bradycardia 10/10/2018  . Syncope, vasovagal 09/20/2018  . Neck mass 10/14/2016  . Depression 05/20/2016  . Hypertensive heart disease   . Paroxysmal atrial fibrillation ([facility])   . Hyperlipidemia LDL goal <70 03/27/2014  . Premature ventricular contraction 03/04/2014  . Chest pain with high risk of acute coronary syndrome 06/28/2013  . Morbid obesity ([place]) 06/28/2013  . OSA (obstructive sleep apnea) 12/18/2012  . Sleepiness 11/06/2012  . Fatigue 11/06/2012  . PALPITATIONS 06/02/2009  . ATRIAL FIBRILLATION 10/16/2008  . DYSLIPIDEMIA 09/05/2008  . Essential hypertension 09/05/2008  . CAD S/P percutaneous coronary angioplasty 09/05/2008  . RBBB 09/05/2008  .  SUPRAVENTRICULAR TACHYCARDIA 09/05/2008  . ALLERGIC RHINITIS 09/05/2008  . GERD 09/05/2008  . BACK PAIN 09/05/2008  . ARRHYTHMIA, HX OF 09/05/2008  . MIGRAINES, HX OF 09/05/2008  . Dyslipidemia 09/05/2008     Prior to Admission medications   Medication Sig Start Date End Date Taking? Authorizing Provider  AIMOVIG 140 MG/ML SOAJ Inject 140 mg into the muscle every 30 (thirty) days. 08/21/18  Yes [provider]  amLODipine (NORVASC) 10 MG tablet Take 1 tablet (10 mg total) by mouth daily. 10/06/18  Yes Susy Frizzle, MD  aspirin 81 MG tablet Take 81 mg by mouth daily.   Yes [provider]  cholecalciferol (VITAMIN D) 1000 UNITS tablet Take 1,000 Units by mouth daily.   Yes [provider]  citalopram (CELEXA) 40 MG tablet TAKE 1 TABLET(40 MG) BY MOUTH DAILY 11/06/18  Yes Susy Frizzle, MD  Coenzyme Q10 (CO Q 10) 100 MG CAPS Take 1 capsule by mouth daily.   Yes [provider]  fexofenadine (ALLEGRA) 180 MG tablet Take 180 mg by mouth daily.    Yes [provider]  fluticasone (FLONASE) 50 MCG/ACT nasal spray INSTILL 2 SPRAYS IN THE  NOSE AT BEDTIME Patient taking differently: Place 2 sprays into both nostrils at bedtime.  04/06/17  Yes Susy Frizzle, MD  furosemide (LASIX) 40 MG tablet TAKE 1 TABLET BY MOUTH  DAILY Patient taking differently: Take 40  mg by mouth daily.  04/14/18  Yes Troy Sine, MD  nitroGLYCERIN (NITROSTAT) 0.4 MG SL tablet PLACE ONE TABLET UNDER THE TONGUE EVERY 5 MINUTES AS NEEDED FOR CHEST PAIN Patient taking differently: Place 0.4 mg under the tongue every 5 (five) minutes as needed for chest pain.  11/22/16  Yes Troy Sine, MD  oxyCODONE-acetaminophen (PERCOCET) 10-325 MG tablet Take 1 tablet by mouth every 8 (eight) hours as needed for pain. 11/23/18  Yes Susy Frizzle, MD  pantoprazole (PROTONIX) 40 MG tablet TAKE 1 TABLET BY MOUTH  EVERY DAY Patient taking differently: Take 40 mg by mouth daily.   04/14/18  Yes Troy Sine, MD  potassium chloride (MICRO-K) 10 MEQ CR capsule TAKE 1 CAPSULE BY MOUTH  EVERY DAY Patient taking differently: Take 10 mEq by mouth daily.  04/14/18  Yes Troy Sine, MD  rosuvastatin (CRESTOR) 40 MG tablet TAKE 1 TABLET BY MOUTH  DAILY Patient taking differently: Take 40 mg by mouth daily.  09/25/18  Yes Troy Sine, MD  telmisartan (MICARDIS) 40 MG tablet Take 1 tablet (40 mg total) by mouth daily. 07/10/18  Yes Troy Sine, MD  traMADol (ULTRAM) 50 MG tablet TAKE 1 TABLET(50 MG) BY MOUTH EVERY 8 HOURS AS NEEDED 11/28/18  Yes [place], Modena Nunnery, MD  zolpidem (AMBIEN) 10 MG tablet TAKE 1 TABLET(10 MG) BY MOUTH AT BEDTIME AS NEEDED FOR SLEEP Patient taking differently: Take 10 mg by mouth at bedtime.  03/07/18  Yes Susy Frizzle, MD     Allergies  Allergen Reactions  . Ibuprofen Swelling  . Other Shortness Of Breath    BETA BLOCKERS  . Topamax [Topiramate] Other (See Comments)    Pt states it made his tongue feel "scalded" and he couldn't eat   . Toprol Xl [Metoprolol Succinate] Other (See Comments)    Personality change  . Metoprolol-Hctz Er Other (See Comments)    Indigestion,mouth raw     Family History  Problem Relation Age of Onset  . Heart disease Father        and sister  . Diabetes Father   . Colon cancer Mother        mets from uterine  . Uterine cancer Mother   . Heart disease Sister      Social History   Socioeconomic History  . Marital status: Married    Spouse name: Not on file  . Number of children: 2  . Years of education: Not on file  . Highest education level: Not on file  Occupational History  . Occupation: retired  Scientific laboratory technician  . Financial resource strain: Not on file  . Food insecurity    Worry: Not on file    Inability: Not on file  . Transportation needs    Medical: Not on file    Non-medical: Not on file  Tobacco Use  . Smoking status: Former Smoker    Quit date: 06/01/1987    Years since  quitting: 31.5  . Smokeless tobacco: Never Used  Substance and Sexual Activity  . Alcohol use: Yes    Alcohol/week: 0.0 standard drinks    Comment: once every 6-7 months  . Drug use: No  . Sexual activity: Not on file  Lifestyle  . Physical activity    Days per week: Not on file    Minutes per session: Not on file  . Stress: Not on file  Relationships  . Social Herbalist on phone: Not  on file    Gets together: Not on file    Attends religious service: Not on file    Active member of club or organization: Not on file    Attends meetings of clubs or organizations: Not on file    Relationship status: Not on file  . Intimate partner violence    Fear of current or ex partner: Not on file    Emotionally abused: Not on file    Physically abused: Not on file    Forced sexual activity: Not on file  Other Topics Concern  . Not on file  Social History Narrative  . Not on file     Review of Systems  Constitutional: Negative.   HENT: Negative.   Eyes: Negative.   Respiratory: Negative.   Cardiovascular: Negative.   Gastrointestinal: Negative.   Endocrine: Negative.   Genitourinary: Negative.   Musculoskeletal: Negative.   Skin: Negative.   Allergic/Immunologic: Negative.   Neurological: Negative.   Hematological: Negative.   Psychiatric/Behavioral: Negative.   All other systems reviewed and are negative.      Objective:    Vitals:   12/07/18 0909  BP: 138/80  Pulse: 83  Resp: 18  Temp: 98 F (36.7 C)  SpO2: 97%  Weight: 265 lb 6.4 oz (120.4 kg)  Height: 5\' 8"  (1.727 m)      Physical Exam Vitals signs and nursing note reviewed.  Constitutional:      Appearance: He is well-developed.  HENT:     Head: Normocephalic and atraumatic.     Nose: Nose normal.  Eyes:     General:        Right eye: No discharge.        Left eye: No discharge.     Conjunctiva/sclera: Conjunctivae normal.  Neck:     Trachea: No tracheal deviation.  Cardiovascular:      Rate and Rhythm: Normal rate and regular rhythm.  Pulmonary:     Effort: Pulmonary effort is normal. No respiratory distress.     Breath sounds: No stridor.  Musculoskeletal: Normal range of motion.     Right elbow: He exhibits laceration. He exhibits normal range of motion, no swelling, no effusion and no deformity. No tenderness found.     Right wrist: He exhibits normal range of motion, no tenderness, no bony tenderness, no swelling, no effusion, no crepitus and no deformity.     Right forearm: He exhibits laceration ( superficial linear abrasions). He exhibits no tenderness, no bony tenderness, no swelling, no edema and no deformity.  Skin:    General: Skin is warm and dry.     Findings: No rash.     Comments: Abrasions roughly 3 x 2 cm to right ulnar wrist, 2 linear 10 cm long abrasions to proximal right forearm, bandages were removed to examine these, no gaping of the skin, evidence of scabbing, mild erythema at the wound itself but no spreading or surrounding edema, erythema or induration, no purulent drainage no bleeding. Right elbow with a small less than 1 cm puncture wound which is also not gaping and without purulent drainage slightly tender to palpation no elbow edema, erythema, effusion.  Normal right elbow range of motion  Neurological:     Mental Status: He is alert.     Motor: No abnormal muscle tone.     Coordination: Coordination normal.  Psychiatric:        Behavior: Behavior normal.           Assessment &  Plan:        ICD-10-CM   1. Puncture wound of right elbow, initial encounter  S51.031A sulfamethoxazole-trimethoprim (BACTRIM DS) 800-160 MG tablet    Tdap vaccine greater than or equal to 7yo IM  2. Fall, initial encounter  W19.XXXA   3. Abrasion of right forearm, initial encounter  S50.811A sulfamethoxazole-trimethoprim (BACTRIM DS) 800-160 MG tablet  4. Need for Tdap vaccination  Z23 Tdap vaccine greater than or equal to 7yo IM    Mechanical fall with  multiple abrasions and a puncture wound to the right elbow forearm and wrist, injury happened 2 days ago patient was concerned about updating his tetanus last was about 7 years ago so will do a booster today.  His wounds appear very well cared for without signs of infection, he has tenderness on the elbow that does trace along tendon to the muscle suspect that he had some minor tendon injury but I do not currently suspect any type of joint infection or sepsis he has good range of motion and no effusion erythema edema.   Wound care and dressing changes discussed at length today, I printed a antibiotic for him to start using if he has any appearance of cellulitis develop.  If he has signs of cellulitis or any joint pain swelling or redness he was encouraged to contact us immediately for repeat evaluation.  Wound care done in clinic, abx ointment, nonadherent gauze and bandaging applied and secured with Coban. Tetanus given, no reaction, patient was discharged in good condition.   Delsa Grana, PA-C 12/07/18 9:55 AM

## 2018-12-07 NOTE — Patient Instructions (Addendum)
Keep open wounds clean and covered with antibiotic ointment or vaseline, non-stick gauze.  You can allow time to air out, dry and scab over - recommend doing that at night with clean bedding.  Take the antibiotics if you develop any spreading redness and swelling around wounds, and follow up with [place] right away to recheck it.  If you have any pain, swelling, redness or stiffness/pain in elbow joint - call us immediately to recheck the joint and symptoms.    Wound Care, Adult Taking care of your wound properly can help to prevent pain, infection, and scarring. It can also help your wound to heal more quickly. How to care for your wound Wound care      Follow instructions from your health care provider about how to take care of your wound. Make sure you: ? Wash your hands with soap and water before you change the bandage (dressing). If soap and water are not available, use hand sanitizer. ? Change your dressing as told by your health care provider. ? Leave stitches (sutures), skin glue, or adhesive strips in place. These skin closures may need to stay in place for 2 weeks or longer. If adhesive strip edges start to loosen and curl up, you may trim the loose edges. Do not remove adhesive strips completely unless your health care provider tells you to do that.  Check your wound area every day for signs of infection. Check for: ? Redness, swelling, or pain. ? Fluid or blood. ? Warmth. ? Pus or a bad smell.  Ask your health care provider if you should clean the wound with mild soap and water. Doing this may include: ? Using a clean towel to pat the wound dry after cleaning it. Do not rub or scrub the wound. ? Applying a cream or ointment. Do this only as told by your health care provider. ? Covering the incision with a clean dressing.  Ask your health care provider when you can leave the wound uncovered.  Keep the dressing dry until your health care provider says it can be removed. Do not  take baths, swim, use a hot tub, or do anything that would put the wound underwater until your health care provider approves. Ask your health care provider if you can take showers. You may only be allowed to take sponge baths. Medicines   If you were prescribed an antibiotic medicine, cream, or ointment, take or use the antibiotic as told by your health care provider. Do not stop taking or using the antibiotic even if your condition improves.  Take over-the-counter and prescription medicines only as told by your health care provider. If you were prescribed pain medicine, take it 30 or more minutes before you do any wound care or as told by your health care provider. General instructions  Return to your normal activities as told by your health care provider. Ask your health care provider what activities are safe.  Do not scratch or pick at the wound.  Do not use any products that contain nicotine or tobacco, such as cigarettes and e-cigarettes. These may delay wound healing. If you need help quitting, ask your health care provider.  Keep all follow-up visits as told by your health care provider. This is important.  Eat a diet that includes protein, vitamin A, vitamin C, and other nutrient-rich foods to help the wound heal. ? Foods rich in protein include meat, dairy, beans, nuts, and other sources. ? Foods rich in vitamin A include carrots and  dark green, leafy vegetables. ? Foods rich in vitamin C include citrus, tomatoes, and other fruits and vegetables. ? Nutrient-rich foods have protein, carbohydrates, fat, vitamins, or minerals. Eat a variety of healthy foods including vegetables, fruits, and whole grains. Contact a health care provider if:  You received a tetanus shot and you have swelling, severe pain, redness, or bleeding at the injection site.  Your pain is not controlled with medicine.  You have redness, swelling, or pain around the wound.  You have fluid or blood coming from  the wound.  Your wound feels warm to the touch.  You have pus or a bad smell coming from the wound.  You have a fever or chills.  You are nauseous or you vomit.  You are dizzy. Get help right away if:  You have a red streak going away from your wound.  The edges of the wound open up and separate.  Your wound is bleeding, and the bleeding does not stop with gentle pressure.  You have a rash.  You faint.  You have trouble breathing. Summary  Always wash your hands with soap and water before changing your bandage (dressing).  To help with healing, eat foods that are rich in protein, vitamin A, vitamin C, and other nutrients.  Check your wound every day for signs of infection. Contact your health care provider if you suspect that your wound is infected. This information is not intended to replace advice given to you by your health care provider. Make sure you discuss any questions you have with your health care provider. Document Released: 02/24/2008 Document Revised: 09/04/2018 Document Reviewed: 12/02/2015 Elsevier Patient Education  2020 [employer].

## 2018-12-16 ENCOUNTER — Other Ambulatory Visit: Payer: Self-pay | Admitting: Cardiovascular Disease

## 2018-12-25 ENCOUNTER — Other Ambulatory Visit: Payer: Self-pay | Admitting: Cardiovascular Disease

## 2019-02-13 ENCOUNTER — Ambulatory Visit (INDEPENDENT_AMBULATORY_CARE_PROVIDER_SITE_OTHER): Payer: Medicare Other | Admitting: Cardiovascular Disease

## 2019-02-13 ENCOUNTER — Encounter: Payer: Self-pay | Admitting: Cardiovascular Disease

## 2019-02-13 ENCOUNTER — Other Ambulatory Visit: Payer: Self-pay

## 2019-02-13 DIAGNOSIS — I2510 Atherosclerotic heart disease of native coronary artery without angina pectoris: Secondary | ICD-10-CM

## 2019-02-13 DIAGNOSIS — R55 Syncope and collapse: Secondary | ICD-10-CM | POA: Diagnosis not present

## 2019-02-13 DIAGNOSIS — R011 Cardiac murmur, unspecified: Secondary | ICD-10-CM

## 2019-02-13 DIAGNOSIS — I10 Essential (primary) hypertension: Secondary | ICD-10-CM | POA: Diagnosis not present

## 2019-02-13 DIAGNOSIS — I4510 Unspecified right bundle-branch block: Secondary | ICD-10-CM

## 2019-02-13 DIAGNOSIS — Z9861 Coronary angioplasty status: Secondary | ICD-10-CM | POA: Diagnosis not present

## 2019-02-13 DIAGNOSIS — G4733 Obstructive sleep apnea (adult) (pediatric): Secondary | ICD-10-CM

## 2019-02-13 MED ORDER — NITROGLYCERIN 0.4 MG SL SUBL: SUBLINGUAL_TABLET | SUBLINGUAL | 3 refills | Status: DC

## 2019-02-13 NOTE — Progress Notes (Signed)
Patient ID: Lee [name], male   DOB: 1951-02-15, 68 y.o.   MRN: [id number]     Primary M.D.: Dr. Jenna Luo  HPI: Lee [name], is a 68 y.o. male who presents to the office today for a 14  month followup cardiology evaluation.  Lee [name] has known CAD and in 2009 underwent stenting of his mid LAD. In 2012 he underwent staged intervention with stenting of his proximal RCA and at a new site in his proximal LAD. A nuclear perfusion study February 2014 showed normal perfusion and function. In January 2015, he was seen by Lee [name] in the office with complaints of chest pressure. He underwent cardiac catheterization this chest pain which was done by Lee [name] on 06/29/2013. Catheterization did not demonstrate significant restenosis of his LAD stents with mild 20% narrowing in the proximal stent and 40% mid stent. The circumflex was normal. His RCA had 40% narrowing just proximal to a widely patent stent in the mid vessel. Nitrate therapy was added to his medical regimen but due to significant nitrate mediated headaches this ultimately was discontinued.   In 2007, a sleep study  suggested upper airway resistance syndrome without overall sleep apnea with the exception of mild sleep apnea with REM sleep; at that time CPAP was not recommended. In June 2014 he was having symptoms suggestive of progressive sleep apnea. He ultimately underwent a sleep study. I do not have the specifics of this diagnostic polysomnogram but subsequently he did undergo a CPAP titration to apparently his AHI was 12 on his initial study and he dropped his oxygen from 96% to 83%. He ultimately was titrated up to a CPAP pressure of 11 cm.  Additional problems also include obesity , as well as a history of palpitations.  I had titrated his beta blocker therapy which improved his palpitations.  He remains active.  He denies any episodes of chest pain.  He denies presyncope or syncope.  A follow-up myocardial  perfusion study on 09/23/2015 showed an ejection fraction at 62%.  There were no ECG changes.  He had normal perfusion without scar or ischemia.  I  saw him in June 2018 at which time was experiencing occasional palpitations.  We discussed reduction of caffeine and I added low-dose Cardizem CD to his regimen.    When I last saw him in July 2019 he was doing well and was without recurrent chest pain.  He was continuing to use CPAP with 1 or percent compliance; [facility] was his DME.   Since I last saw him, he was hospitalized in April with syncope and was suspected to be secondary to a vagal episode.  He was noted to have significant sinus bradycardia and diltiazem was stopped.  An echo Doppler study on September 20, 2018 showed an EF of 60 to 65% with grade 2 diastolic dysfunction, clinical vacation and aortic valve sclerosis.  He was discharged home with plans for monitoring on Coreg.  He was admitted on Oct 10, 2018 with recurrent syncope and was found to have significant pauses on his monitor and had a 36 seconds strip showing multiple pauses lasting 6, 7, 9, and 12 seconds.  He was initially found by EMS in complete heart block with a ventricular rate in the 20s which improved to 2-1 and eventually sinus rhythm.  He was admitted to the hospital, carvedilol was discontinued.  He was last evaluated by Lee [name] in the office in June 2020 and was doing well  without further symptoms of bradycardia, palpitations chest pain shortness of breath dizziness presyncope or syncope.  He continues to be on CPAP therapy he admits to using his CPAP for naps.  I do not have any download information which will need to be obtained.  He presents for reevaluation.  Past Medical History:  Diagnosis Date  . Allergic rhinitis    chronic sinusitis  . Arthritis   . CAD (coronary artery disease)    a. 2009 PCI/DES to mLAD; b. 05/2010 s/p DES RCA and LAD.; c. 05/2013 Cath: LAD mild ISR, LCX nl, RCA 40p, patent  stents.  . Cataract    has had lasik surg  . COPD (chronic obstructive pulmonary disease) ([place])   . Diabetes mellitus type 2, controlled, without complications ([place])   . Diverticulosis   . DJD (degenerative joint disease)   . Esophagitis 1991   grade 1  . GERD (gastroesophageal reflux disease)    Hiatal hernia  . Hemorrhoids   . Hyperlipidemia   . Hypertensive heart disease   . Iron deficiency anemia   . Migraines   . Paroxysmal atrial fibrillation ([facility])    a. s/p afib ablation 10/22/08 ([facility]).  . PVC's (premature ventricular contractions)   . Sleep apnea    Questionalble: RDI during the total sleep time 6h 28 mins was 3.55/hr during REM sleep was at 5.71/hr. Supine AHI was 5.93/hr.  . Syncope 08/2018    Past Surgical History:  Procedure Laterality Date  . CARDIAC CATHETERIZATION  05/2010   The proximal LAD was then predilated with 2.0 x 12 trek. this was then stented with a 2.5 x 16 promus Element drug-eluting stent at 14 atmosphere and prostdilated with 2.75 x 12 Citrus trek at 16 atmospheres(2.8 mm) resulting the reduction of 80% proximal LAD stenosis to 0% residual with excellent flow.  Marland Kitchen CARDIAC CATHETERIZATION  06/05/2010   Successful percutaneous coronary intervention to the right coronary artery with percutaneous transluminal coronary angioplasty/stenting and insertion 3.0 x 16 mm Promus DES post dilated to 3.35 mm with stenosis being reduced to 0%  . CARDIAC CATHETERIZATION  02/2008   Post dilatation was performed using a 2.75 x 9  sprinter, 10 atmospheres for 40 seconds and then 9 atmosphere for 35 sec. This resulted in the 80% area of narrowing pre-intervention, now appearing to normal. There was no edvidence of the dissection or thrombus and there was TIMI III flow pre and post.  . CARDIAC CATHETERIZATION  02/2006  . CORONARY ANGIOPLASTY WITH STENT PLACEMENT     3 stents/ 4 caths  . EYE SURGERY    . KNEE ARTHROSCOPY     right  . LASIK    . LEFT HEART  CATHETERIZATION WITH CORONARY ANGIOGRAM N/A 06/29/2013   Procedure: LEFT HEART CATHETERIZATION WITH CORONARY ANGIOGRAM;  Surgeon: Lee Man, MD;  Location: [facility] CATH LAB;  Service: Cardiovascular;  Laterality: N/A;  . NASAL SINUS SURGERY  2001  . NECK SURGERY     Cervical fusion  . RADIOFREQUENCY ABLATION  May 2010   atrial fibrillation  . ROTATOR CUFF REPAIR     left  . SHOULDER OPEN ROTATOR CUFF REPAIR     right  . SPINE SURGERY    . WRIST ARTHROCENTESIS     left    Allergies  Allergen Reactions  . Ibuprofen Swelling  . Other Shortness Of Breath    BETA BLOCKERS  . Topamax [Topiramate] Other (See Comments)    Pt states it made his tongue feel "  scalded" and he couldn't eat   . Toprol Xl [Metoprolol Succinate] Other (See Comments)    Personality change  . Metoprolol-Hctz Er Other (See Comments)    Indigestion,mouth raw    Current Outpatient Medications  Medication Sig Dispense Refill  . AIMOVIG 140 MG/ML SOAJ Inject 140 mg into the muscle every 30 (thirty) days.    Marland Kitchen amLODipine (NORVASC) 10 MG tablet Take 1 tablet (10 mg total) by mouth daily. 90 tablet 3  . aspirin 81 MG tablet Take 81 mg by mouth daily.    . cholecalciferol (VITAMIN D) 1000 UNITS tablet Take 1,000 Units by mouth daily.    . citalopram (CELEXA) 40 MG tablet TAKE 1 TABLET(40 MG) BY MOUTH DAILY 90 tablet 3  . Coenzyme Q10 (CO Q 10) 100 MG CAPS Take 1 capsule by mouth daily.    . fexofenadine (ALLEGRA) 180 MG tablet Take 180 mg by mouth daily.     . fluticasone (FLONASE) 50 MCG/ACT nasal spray INSTILL 2 SPRAYS IN THE  NOSE AT BEDTIME (Patient taking differently: Place 2 sprays into both nostrils at bedtime. ) 48 g 3  . furosemide (LASIX) 40 MG tablet TAKE 1 TABLET BY MOUTH  DAILY (Patient taking differently: Take 40 mg by mouth daily. ) 90 tablet 3  . MICARDIS 40 MG tablet TAKE 1 TABLET BY MOUTH  DAILY 90 tablet 0  . nitroGLYCERIN (NITROSTAT) 0.4 MG SL tablet PLACE ONE TABLET UNDER THE TONGUE EVERY 5 MINUTES  AS NEEDED FOR CHEST PAIN 25 tablet 3  . oxyCODONE-acetaminophen (PERCOCET) 10-325 MG tablet Take 1 tablet by mouth every 8 (eight) hours as needed for pain. 21 tablet 0  . pantoprazole (PROTONIX) 40 MG tablet TAKE 1 TABLET BY MOUTH  EVERY DAY (Patient taking differently: Take 40 mg by mouth daily. ) 90 tablet 3  . potassium chloride (MICRO-K) 10 MEQ CR capsule TAKE 1 CAPSULE BY MOUTH  EVERY DAY (Patient taking differently: Take 10 mEq by mouth daily. ) 90 capsule 3  . rosuvastatin (CRESTOR) 40 MG tablet TAKE 1 TABLET BY MOUTH  DAILY 90 tablet 0  . traMADol (ULTRAM) 50 MG tablet TAKE 1 TABLET(50 MG) BY MOUTH EVERY 8 HOURS AS NEEDED 30 tablet 0  . zolpidem (AMBIEN) 10 MG tablet TAKE 1 TABLET(10 MG) BY MOUTH AT BEDTIME AS NEEDED FOR SLEEP (Patient taking differently: Take 10 mg by mouth at bedtime. ) 30 tablet 0   No current facility-administered medications for this visit.     Socially he's married has 2 children he works in Development worker, international aid. There is a remote tobacco history but he quit in 1989.  ROS General: Negative; No fevers, chills, or night sweats; positive for obesity HEENT: Negative; No changes in vision or hearing, sinus congestion, difficulty swallowing Pulmonary: Negative; No cough, wheezing, shortness of breath, hemoptysis Cardiovascular: See history of present illness;  GI: Negative; No nausea, vomiting, diarrhea, or abdominal pain GU: Positive for GERD; No dysuria, hematuria, or difficulty voiding Musculoskeletal: Negative; no myalgias, joint pain, or weakness Hematologic/Oncology: Negative; no easy bruising, bleeding Endocrine: Negative; no heat/cold intolerance; no diabetes Neuro: Positive for history of migraine headaches; no changes in balance,  Skin: Negative; No rashes or skin lesions Psychiatric: Negative; No behavioral problems, depression Sleep: Positive for obstructive sleep apnea,  on CPAP therapy; No snoring, daytime sleepiness, hypersomnolence, bruxism, restless  legs, hypnogognic hallucinations, no cataplexy Other comprehensive 14 point system review is negative.  PE BP 122/74   Pulse 83   Ht _0  (1.727  m)   Wt 262 lb 12.8 oz (119.2 kg)   BMI 39.96 kg/m   Morbid obesity  Repeat blood pressure by me 130/70 supine and 124/70 standing;  Wt Readings from Last 3 Encounters:  02/13/19 262 lb 12.8 oz (119.2 kg)  12/07/18 265 lb 6.4 oz (120.4 kg)  11/16/18 263 lb 12.8 oz (119.7 kg)   General: Alert, oriented, no distress.  Skin: normal turgor, no rashes, warm and dry HEENT: Normocephalic, atraumatic. Pupils equal round and reactive to light; sclera anicteric; extraocular muscles intact;  Nose without nasal septal hypertrophy Mouth/Parynx benign; Mallinpatti scale 3 Neck: No JVD, no carotid bruits; normal carotid upstroke Lungs: clear to ausculatation and percussion; no wheezing or rales Chest wall: without tenderness to palpitation Heart: PMI not displaced, RRR, s1 s2 normal, 5-5/7 systolic murmur in the aortic area okay right right, no diastolic murmur, no rubs, gallops, thrills, or heaves Abdomen: soft, nontender; no hepatosplenomehaly, BS+; abdominal aorta nontender and not dilated by palpation. Back: no CVA tenderness Pulses 2+ Musculoskeletal: full range of motion, normal strength, no joint deformities Extremities: no clubbing cyanosis or edema, Homan's sign negative  Neurologic: grossly nonfocal; Cranial nerves grossly wnl Psychologic: Normal mood and affect   ECG (independently read by me): Sinus rhythm at 83 bpm with first-degree AV block with a PR of 220 ms.  Right bundle branch block with repolarization changes.  July 2019 ECG (independently read by me): Normal sinus rhythm at 66 bpm.  First-degree AV block.  Right bundle branch block with repolarization changes.  June 2018 ECG (independently read by me): Normal sinus rhythm at 65 bpm.  First degree AV block.  Right bundle branch block with repolarization changes.  PR interval  218 ms.  July 2017 ECG (independently read by me): Sinus rhythm at 69 bpm with right bundle branch block and first-degree AV block.  Q wave is present in lead 3.  April 2016 ECG (independently read by me): Normal sinus rhythm at 68 with right bundle branch block and repolarization changes.  First-degree AV block.  No ectopy  October 2015 ECG (independently read by me): Normal sinus rhythm with right bundle branch block.  Borderline first-degree AV block with PR interval 206 ms.  No ectopy  Prior February 2015 ECG (independently read by me): Normal sinus rhythm at 66 beats per minute, right bundle branch block with repolarization changes, first-degree AV block.  ECG from 12/12/2012: NSR at 69, RBBB  LABS:  BMP Latest Ref Rng & Units 10/11/2018 10/10/2018 10/10/2018  Glucose 70 - 99 mg/dL 122(H) - 176(H)  BUN 8 - 23 mg/dL 13 - 14  Creatinine 0.61 - 1.24 mg/dL 0.77 0.79 0.92  BUN/Creat Ratio 6 - 22 (calc) - - -  Sodium 135 - 145 mmol/L 136 - 134(L)  Potassium 3.5 - 5.1 mmol/L 3.8 - 5.1  Chloride 98 - 111 mmol/L 99 - 102  CO2 22 - 32 mmol/L 25 - 23  Calcium 8.9 - 10.3 mg/dL 9.1 - 8.6(L)    Hepatic Function Latest Ref Rng & Units 10/10/2018 10/06/2018 09/20/2018  Total Protein 6.5 - 8.1 g/dL 6.1(L) 6.4 6.2(L)  Albumin 3.5 - 5.0 g/dL 3.8 - 3.8  AST 15 - 41 U/L _0 ALT 0 - 44 U/L _1 Alk Phosphatase 38 - 126 U/L 98 - 63  Total Bilirubin 0.3 - 1.2 mg/dL 0.6 0.4 0.7    CBC Latest Ref Rng & Units 10/10/2018 10/06/2018 09/20/2018  WBC 4.0 - 10.5 K/uL  6.1 7.3 6.6  Hemoglobin 13.0 - 17.0 g/dL 13.0 13.4 13.2  Hematocrit 39.0 - 52.0 % 41.7 39.9 40.6  Platelets 150 - 400 K/uL 207 245 210   Lab Results  Component Value Date   MCV 90.7 10/10/2018   MCV 87.1 10/06/2018   MCV 88.6 09/20/2018    BNP    Component Value Date/Time   PROBNP 36.0 06/04/2010 2206    Lipid Panel     Component Value Date/Time   CHOL 105 10/06/2018 0917   CHOL 155 11/21/2012 1025   TRIG 60 10/06/2018  0917   TRIG 81 11/21/2012 1025   HDL 41 10/06/2018 0917   HDL 45 11/21/2012 1025   CHOLHDL 2.6 10/06/2018 0917   VLDL 10 09/13/2016 0822   LDLCALC 50 10/06/2018 0917   LDLCALC 94 11/21/2012 1025     RADIOLOGY: No results found.  IMPRESSION:  1. CAD S/P percutaneous coronary angioplasty   2. Essential hypertension   3. Heart murmur, systolic   4. Syncope and collapse   5. OSA (obstructive sleep apnea)   6. Morbid obesity ([place])   7. RBBB     ASSESSMENT AND PLAN: Lee [name] Is a 68 year old male who has CAD and is status post intervention to his proximal and mid LAD as well as his RCA.  A repeat cardiac catheterization by Dr. Ellyn Hack did not demonstrate significant cardiac etiology to his chest pain. He was empirically started on nitrate therapy but this resulted in frequent migraine headaches. It is possible that some of his pain may have been due to spasm versus esophageal etiology or musculoskeletal symptomatology.  He was hospitalized in April and again in May with syncope and was found to have multiple pauses ranging from 679 and 12 seconds and transient complete heart block with ventricular rate in the 20s.  He has now been off all AV nodal agents moving forward.  His driving has been limited for 6 months.  Any recurrent syncope will warrant permanent pacemaker implantation.  He will be undergoing follow-up monitoring with Lee [name].  He continues to use CPAP with reference to his sleep apnea.  We will need to obtain a download of his device.  He may need some ramp time adjustment for pressure adjustment.  His ECG today remained stable with sinus rhythm at 83 bpm, first-degree AV block, and right bundle branch block.  BMI is significantly increased and he is approaching morbid obesity.  We discussed the importance of weight loss and increased exercise.  Blood pressure remained stable on current therapy.  He will have a follow-up evaluation following his monitoring with Lee [name] in  December.  He will undergo a follow-up echo to reassess his cardiac murmur prior to his office visit with me in  March 2021 for follow-up evaluation.  Time spent: 25 minutes Troy Sine, MD, [facility]  02/17/2019 2:23 PM

## 2019-02-13 NOTE — Patient Instructions (Signed)
Medication Instructions:  The current medical regimen is effective;  continue present plan and medications as directed. Please refer to the Current Medication list given to you today. If you need a refill on your cardiac medications before your next appointment, please call your pharmacy.  Testing/Procedures: Echocardiogram - Your physician has requested that you have an echocardiogram. Echocardiography is a painless test that uses sound waves to create images of your heart. It provides your doctor with information about the size and shape of your heart and how well your heart's chambers and valves are working. This procedure takes approximately one hour. There are no restrictions for this procedure. This will be performed at our [facility] location - [address], [address].  Special Instructions: KEEP FOLLOW UP WITH DR Rayann Heman  Follow-Up: You will need a follow up appointment in 6 months.  Please call our office 2 months in advance to schedule this appointment.  You may see Shelva Majestic, MD or one of the following Advanced Practice Providers on your designated Care Team: [place], [place] . Fabian Sharp, PA-C     At [facility], you and your health needs are our priority.  As part of our continuing mission to provide you with exceptional heart care, we have created designated Provider Care Teams.  These Care Teams include your primary Cardiologist (physician) and Advanced Practice Providers (APPs -  Physician Assistants and Nurse Practitioners) who all work together to provide you with the care you need, when you need it.  Thank you for choosing [facility] at [facility]!!

## 2019-02-17 ENCOUNTER — Encounter: Payer: Self-pay | Admitting: Cardiovascular Disease

## 2019-02-23 ENCOUNTER — Ambulatory Visit (HOSPITAL_COMMUNITY): Payer: Medicare Other | Attending: Cardiology

## 2019-02-23 ENCOUNTER — Other Ambulatory Visit: Payer: Self-pay

## 2019-02-23 DIAGNOSIS — I2510 Atherosclerotic heart disease of native coronary artery without angina pectoris: Secondary | ICD-10-CM | POA: Insufficient documentation

## 2019-02-23 DIAGNOSIS — Z9861 Coronary angioplasty status: Secondary | ICD-10-CM | POA: Insufficient documentation

## 2019-02-23 DIAGNOSIS — R011 Cardiac murmur, unspecified: Secondary | ICD-10-CM | POA: Diagnosis present

## 2019-02-23 MED ORDER — PERFLUTREN LIPID MICROSPHERE
1.0000 mL | INTRAVENOUS | Status: AC | PRN
  Administered 2019-02-23: 2 mL via INTRAVENOUS

## 2019-02-25 ENCOUNTER — Other Ambulatory Visit: Payer: Self-pay | Admitting: Family Medicine

## 2019-02-26 NOTE — Telephone Encounter (Signed)
Requesting refill    Ambien   LOV: 10/03/2018  LRF:  03/07/18

## 2019-03-09 ENCOUNTER — Telehealth: Payer: Self-pay | Admitting: *Deleted

## 2019-03-09 NOTE — Telephone Encounter (Signed)
Received request from pharmacy for PA on Ambien.  PA submitted.   Dx: [id number]

## 2019-03-09 NOTE — Telephone Encounter (Signed)
Your information has been sent to [facility]. 

## 2019-03-12 ENCOUNTER — Other Ambulatory Visit: Payer: Self-pay | Admitting: Cardiovascular Disease

## 2019-03-12 NOTE — Telephone Encounter (Deleted)
Received PA determination.   PA denied.   Medication therapy has been changed.

## 2019-03-12 NOTE — Telephone Encounter (Signed)
Received PA determination.   PA denied.   Patient must try and fail both belsomra and ramelteon.  MD please advise.

## 2019-03-17 ENCOUNTER — Other Ambulatory Visit: Payer: Self-pay | Admitting: Cardiovascular Disease

## 2019-03-19 NOTE — Telephone Encounter (Signed)
MD please advise

## 2019-03-19 NOTE — Telephone Encounter (Signed)
Switch to belsomra 10 mg poqhs prn

## 2019-03-19 NOTE — Addendum Note (Signed)
Addended by: Sheral Flow on: 03/19/2019 05:06 PM   Modules accepted: Orders

## 2019-03-19 NOTE — Telephone Encounter (Signed)
Medication pended for your approval.

## 2019-03-20 ENCOUNTER — Other Ambulatory Visit: Payer: Self-pay | Admitting: Cardiovascular Disease

## 2019-03-20 MED ORDER — BELSOMRA 10 MG PO TABS: 1.0000 | ORAL_TABLET | Freq: Every evening | ORAL | 0 refills | Status: DC | PRN

## 2019-03-20 NOTE — Addendum Note (Signed)
Addended by: Jenna Luo T on: 03/20/2019 06:30 AM   Modules accepted: Orders

## 2019-03-27 ENCOUNTER — Other Ambulatory Visit: Payer: Self-pay

## 2019-03-27 NOTE — Telephone Encounter (Signed)
Requested Prescriptions   Pending Prescriptions Disp Refills  . oxyCODONE-acetaminophen (PERCOCET) 10-325 MG tablet 21 tablet 0    Sig: Take 1 tablet by mouth every 8 (eight) hours as needed for pain.    Last OV 12/07/2018  Last written 11/23/2018

## 2019-03-29 MED ORDER — OXYCODONE-ACETAMINOPHEN 10-325 MG PO TABS: 1.0000 | ORAL_TABLET | Freq: Three times a day (TID) | ORAL | 0 refills | Status: DC | PRN

## 2019-04-30 ENCOUNTER — Other Ambulatory Visit: Payer: Self-pay

## 2019-04-30 ENCOUNTER — Encounter: Payer: Self-pay | Admitting: Family Medicine

## 2019-04-30 ENCOUNTER — Ambulatory Visit (INDEPENDENT_AMBULATORY_CARE_PROVIDER_SITE_OTHER): Payer: Medicare Other | Admitting: Family Medicine

## 2019-04-30 ENCOUNTER — Inpatient Hospital Stay (HOSPITAL_COMMUNITY)
Admission: EM | Admit: 2019-04-30 | Discharge: 2019-05-02 | DRG: 244 | Disposition: A | Discharge status: Home / Self Care | Payer: Medicare Other | Attending: Cardiovascular Disease | Admitting: Cardiovascular Disease

## 2019-04-30 ENCOUNTER — Emergency Department (HOSPITAL_COMMUNITY): Payer: Medicare Other

## 2019-04-30 VITALS — BP 150/72 | HR 45 | Resp 22

## 2019-04-30 DIAGNOSIS — R001 Bradycardia, unspecified: Secondary | ICD-10-CM

## 2019-04-30 DIAGNOSIS — Z888 Allergy status to other drugs, medicaments and biological substances status: Secondary | ICD-10-CM

## 2019-04-30 DIAGNOSIS — J449 Chronic obstructive pulmonary disease, unspecified: Secondary | ICD-10-CM | POA: Diagnosis present

## 2019-04-30 DIAGNOSIS — K5790 Diverticulosis of intestine, part unspecified, without perforation or abscess without bleeding: Secondary | ICD-10-CM | POA: Diagnosis present

## 2019-04-30 DIAGNOSIS — K219 Gastro-esophageal reflux disease without esophagitis: Secondary | ICD-10-CM | POA: Diagnosis present

## 2019-04-30 DIAGNOSIS — Z79899 Other long term (current) drug therapy: Secondary | ICD-10-CM | POA: Diagnosis not present

## 2019-04-30 DIAGNOSIS — Z87891 Personal history of nicotine dependence: Secondary | ICD-10-CM

## 2019-04-30 DIAGNOSIS — Z20828 Contact with and (suspected) exposure to other viral communicable diseases: Secondary | ICD-10-CM | POA: Diagnosis present

## 2019-04-30 DIAGNOSIS — G4730 Sleep apnea, unspecified: Secondary | ICD-10-CM | POA: Diagnosis present

## 2019-04-30 DIAGNOSIS — E785 Hyperlipidemia, unspecified: Secondary | ICD-10-CM | POA: Diagnosis present

## 2019-04-30 DIAGNOSIS — I2510 Atherosclerotic heart disease of native coronary artery without angina pectoris: Secondary | ICD-10-CM | POA: Diagnosis present

## 2019-04-30 DIAGNOSIS — Z7982 Long term (current) use of aspirin: Secondary | ICD-10-CM | POA: Diagnosis not present

## 2019-04-30 DIAGNOSIS — R739 Hyperglycemia, unspecified: Secondary | ICD-10-CM

## 2019-04-30 DIAGNOSIS — I4510 Unspecified right bundle-branch block: Secondary | ICD-10-CM | POA: Diagnosis present

## 2019-04-30 DIAGNOSIS — I4891 Unspecified atrial fibrillation: Secondary | ICD-10-CM | POA: Diagnosis present

## 2019-04-30 DIAGNOSIS — Z9861 Coronary angioplasty status: Secondary | ICD-10-CM | POA: Diagnosis not present

## 2019-04-30 DIAGNOSIS — Z8249 Family history of ischemic heart disease and other diseases of the circulatory system: Secondary | ICD-10-CM

## 2019-04-30 DIAGNOSIS — I441 Atrioventricular block, second degree: Secondary | ICD-10-CM | POA: Diagnosis present

## 2019-04-30 DIAGNOSIS — R7303 Prediabetes: Secondary | ICD-10-CM | POA: Diagnosis present

## 2019-04-30 DIAGNOSIS — M1990 Unspecified osteoarthritis, unspecified site: Secondary | ICD-10-CM | POA: Diagnosis present

## 2019-04-30 DIAGNOSIS — I442 Atrioventricular block, complete: Secondary | ICD-10-CM | POA: Diagnosis not present

## 2019-04-30 DIAGNOSIS — R0789 Other chest pain: Secondary | ICD-10-CM | POA: Diagnosis not present

## 2019-04-30 DIAGNOSIS — I480 Paroxysmal atrial fibrillation: Secondary | ICD-10-CM | POA: Diagnosis present

## 2019-04-30 DIAGNOSIS — I10 Essential (primary) hypertension: Secondary | ICD-10-CM | POA: Diagnosis not present

## 2019-04-30 DIAGNOSIS — Z959 Presence of cardiac and vascular implant and graft, unspecified: Secondary | ICD-10-CM

## 2019-04-30 DIAGNOSIS — Z955 Presence of coronary angioplasty implant and graft: Secondary | ICD-10-CM | POA: Diagnosis not present

## 2019-04-30 DIAGNOSIS — I119 Hypertensive heart disease without heart failure: Secondary | ICD-10-CM | POA: Diagnosis present

## 2019-04-30 DIAGNOSIS — Z886 Allergy status to analgesic agent status: Secondary | ICD-10-CM

## 2019-04-30 LAB — COMPREHENSIVE METABOLIC PANEL
ALT: 34 U/L (ref 0–44)
AST: 26 U/L (ref 15–41)
Albumin: 4 g/dL (ref 3.5–5.0)
Alkaline Phosphatase: 67 U/L (ref 38–126)
Anion gap: 9 (ref 5–15)
BUN: 12 mg/dL (ref 8–23)
CO2: 26 mmol/L (ref 22–32)
Calcium: 9.2 mg/dL (ref 8.9–10.3)
Chloride: 99 mmol/L (ref 98–111)
Creatinine, Ser: 0.81 mg/dL (ref 0.61–1.24)
GFR calc Af Amer: >60 mL/min (ref >60)
GFR calc non Af Amer: >60 mL/min (ref >60)
Glucose, Bld: 252 mg/dL — ABNORMAL HIGH (ref 70–99)
Potassium: 4.5 mmol/L (ref 3.5–5.1)
Sodium: 134 mmol/L — ABNORMAL LOW (ref 135–145)
Total Bilirubin: 0.4 mg/dL (ref 0.3–1.2)
Total Protein: 6.5 g/dL (ref 6.5–8.1)

## 2019-04-30 LAB — CBC WITH DIFFERENTIAL/PLATELET
Abs Immature Granulocytes: 0.03 10*3/uL (ref 0.00–0.07)
Basophils Absolute: 0 10*3/uL (ref 0.0–0.1)
Basophils Relative: 1 %
Eosinophils Absolute: 0.1 10*3/uL (ref 0.0–0.5)
Eosinophils Relative: 1 %
HCT: 42.1 % (ref 39.0–52.0)
Hemoglobin: 13.8 g/dL (ref 13.0–17.0)
Immature Granulocytes: 0 %
Lymphocytes Relative: 23 %
Lymphs Abs: 1.6 10*3/uL (ref 0.7–4.0)
MCH: 29.3 pg (ref 26.0–34.0)
MCHC: 32.8 g/dL (ref 30.0–36.0)
MCV: 89.4 fL (ref 80.0–100.0)
Monocytes Absolute: 0.5 10*3/uL (ref 0.1–1.0)
Monocytes Relative: 7 %
Neutro Abs: 4.7 10*3/uL (ref 1.7–7.7)
Neutrophils Relative %: 68 %
Platelets: 226 10*3/uL (ref 150–400)
RBC: 4.71 MIL/uL (ref 4.22–5.81)
RDW: 11.9 % (ref 11.5–15.5)
WBC: 7 10*3/uL (ref 4.0–10.5)
nRBC: 0 % (ref 0.0–0.2)

## 2019-04-30 LAB — TROPONIN I (HIGH SENSITIVITY)
Troponin I (High Sensitivity): 5 ng/L (ref <18)
Troponin I (High Sensitivity): 5 ng/L (ref <18)

## 2019-04-30 LAB — MAGNESIUM: Magnesium: 1.9 mg/dL (ref 1.7–2.4)

## 2019-04-30 LAB — CBG MONITORING, ED: Glucose-Capillary: 227 mg/dL — ABNORMAL HIGH (ref 70–99)

## 2019-04-30 LAB — SARS CORONAVIRUS 2 (TAT 6-24 HRS): SARS Coronavirus 2: NEGATIVE

## 2019-04-30 MED ORDER — SODIUM CHLORIDE 0.9% FLUSH
3.0000 mL | Freq: Two times a day (BID) | INTRAVENOUS | Status: DC
  Administered 2019-04-30 – 2019-05-01 (×2): 3 mL via INTRAVENOUS

## 2019-04-30 MED ORDER — INSULIN ASPART 100 UNIT/ML ~~LOC~~ SOLN
0.0000 [IU] | Freq: Three times a day (TID) | SUBCUTANEOUS | Status: DC
  Administered 2019-05-01: 2 [IU] via SUBCUTANEOUS
  Administered 2019-05-01 – 2019-05-02 (×3): 3 [IU] via SUBCUTANEOUS

## 2019-04-30 MED ORDER — IRBESARTAN 75 MG PO TABS
75.0000 mg | ORAL_TABLET | Freq: Every day | ORAL | Status: DC
  Administered 2019-05-01 – 2019-05-02 (×2): 75 mg via ORAL
  Filled 2019-04-30 (×3): qty 1

## 2019-04-30 MED ORDER — TRAMADOL HCL 50 MG PO TABS
50.0000 mg | ORAL_TABLET | Freq: Four times a day (QID) | ORAL | Status: DC | PRN
  Administered 2019-05-01 – 2019-05-02 (×3): 50 mg via ORAL
  Filled 2019-04-30 (×4): qty 1

## 2019-04-30 MED ORDER — ASPIRIN 81 MG PO CHEW
324.0000 mg | CHEWABLE_TABLET | Freq: Once | ORAL | Status: AC
  Administered 2019-04-30: 324 mg via ORAL

## 2019-04-30 MED ORDER — ACETAMINOPHEN 325 MG PO TABS
650.0000 mg | ORAL_TABLET | Freq: Four times a day (QID) | ORAL | Status: DC | PRN
  Administered 2019-05-01: 650 mg via ORAL
  Filled 2019-04-30: qty 2

## 2019-04-30 MED ORDER — HYDRALAZINE HCL 20 MG/ML IJ SOLN: 10.0000 mg | Freq: Four times a day (QID) | INTRAMUSCULAR | Status: DC | PRN

## 2019-04-30 MED ORDER — AMLODIPINE BESYLATE 10 MG PO TABS
10.0000 mg | ORAL_TABLET | Freq: Every day | ORAL | Status: DC
  Administered 2019-04-30 – 2019-05-02 (×3): 10 mg via ORAL
  Filled 2019-04-30: qty 2
  Filled 2019-04-30 (×2): qty 1

## 2019-04-30 MED ORDER — ROSUVASTATIN CALCIUM 20 MG PO TABS
40.0000 mg | ORAL_TABLET | Freq: Every day | ORAL | Status: DC
  Administered 2019-04-30 – 2019-05-02 (×3): 40 mg via ORAL
  Filled 2019-04-30 (×3): qty 2

## 2019-04-30 MED ORDER — SENNOSIDES-DOCUSATE SODIUM 8.6-50 MG PO TABS: 1.0000 | ORAL_TABLET | Freq: Every evening | ORAL | Status: DC | PRN

## 2019-04-30 MED ORDER — ACETAMINOPHEN 650 MG RE SUPP: 650.0000 mg | Freq: Four times a day (QID) | RECTAL | Status: DC | PRN

## 2019-04-30 NOTE — Progress Notes (Signed)
Subjective:    Patient ID: Lee [name], male    DOB: 03/27/1951, 68 y.o.   MRN: [id number]  HPI Patient is a very pleasant 68 year old Caucasian male who presents today urgently with chest pain.  He states that the pain began yesterday.  He describes it as a heartburn-like pressure in the center of his chest.  However the pressure is intensified with activity.  He reports significant dyspnea on exertion.  He reports presyncope and near syncope with activity.  He reports angina-like chest pain with activity.  His wife had an appointment today and he came to the doctor with her.  Patient was initially cool and clammy upon presentation.  He was extremely lightheaded.  Palpable heart rate was in the mid 30s.  The patient was laid supine on the table and an EKG was performed.  EKG shows marked sinus bradycardia with a heart rate of 46 bpm.  The patient has a first-degree AV block with a right bundle branch block.  These are old findings.  He has a Q-wave in lead V1.  This is also old compared to an EKG from September of this year.  He has Q waves in leads III, aVF.  These are also old.  He has pseudonormalization of lead V2 and V3 which previously showed ST segment downsloping in September.  Patient was given 4 baby aspirin upon presentation and was placed on 2 L of oxygen.  The chest pain resolved as the patient was lying supine.  He did not require nitroglycerin.  However the patient continues to endorse some mild shortness of breath.  Heart rate improved 46 bpm.  Past medical history is significant for coronary artery disease status post LAD stenting in 2009.  Catheterization in 2015 showed mild in-stent restenosis.  Patient was admitted to the hospital last year with syncope.  Was found to have profound bradycardia.  Diltiazem was held.  Carvedilol was held.  Patient's marked sinus pauses and AV nodal dissociation resolved after the medication was stopped.  He has been managed medically since then  however he is in profound bradycardia today and is now symptomatic. Past Medical History:  Diagnosis Date   Allergic rhinitis    chronic sinusitis   Arthritis    CAD (coronary artery disease)    a. 2009 PCI/DES to mLAD; b. 05/2010 s/p DES RCA and LAD.; c. 05/2013 Cath: LAD mild ISR, LCX nl, RCA 40p, patent stents.   Cataract    has had lasik surg   COPD (chronic obstructive pulmonary disease) (HCC)    Diabetes mellitus type 2, controlled, without complications (HCC)    Diverticulosis    DJD (degenerative joint disease)    Esophagitis 1991   grade 1   GERD (gastroesophageal reflux disease)    Hiatal hernia   Hemorrhoids    Hyperlipidemia    Hypertensive heart disease    Iron deficiency anemia    Migraines    Paroxysmal atrial fibrillation ([place])    a. s/p afib ablation 10/22/08 (J. [facility]).   PVC's (premature ventricular contractions)    Sleep apnea    Questionalble: RDI during the total sleep time 6h 28 mins was 3.55/hr during REM sleep was at 5.71/hr. Supine AHI was 5.93/hr.   Syncope 08/2018   Past Surgical History:  Procedure Laterality Date   CARDIAC CATHETERIZATION  05/2010   The proximal LAD was then predilated with 2.0 x 12 trek. this was then stented with a 2.5 x 16 promus Element drug-eluting stent  at 14 atmosphere and prostdilated with 2.75 x 12 [place] trek at 16 atmospheres(2.8 mm) resulting the reduction of 80% proximal LAD stenosis to 0% residual with excellent flow.   CARDIAC CATHETERIZATION  06/05/2010   Successful percutaneous coronary intervention to the right coronary artery with percutaneous transluminal coronary angioplasty/stenting and insertion 3.0 x 16 mm Promus DES post dilated to 3.35 mm with stenosis being reduced to 0%   CARDIAC CATHETERIZATION  02/2008   Post dilatation was performed using a 2.75 x 9 [place] sprinter, 10 atmospheres for 40 seconds and then 9 atmosphere for 35 sec. This resulted in the 80% area of narrowing pre-intervention,  now appearing to normal. There was no edvidence of the dissection or thrombus and there was TIMI III flow pre and post.   CARDIAC CATHETERIZATION  02/2006   CORONARY ANGIOPLASTY WITH STENT PLACEMENT     3 stents/ 4 caths   EYE SURGERY     KNEE ARTHROSCOPY     right   LASIK     LEFT HEART CATHETERIZATION WITH CORONARY ANGIOGRAM N/A 06/29/2013   Procedure: LEFT HEART CATHETERIZATION WITH CORONARY ANGIOGRAM;  Surgeon: Leonie Man, MD;  Location: [facility] CATH LAB;  Service: Cardiovascular;  Laterality: N/A;   NASAL SINUS SURGERY  2001   NECK SURGERY     Cervical fusion   RADIOFREQUENCY ABLATION  May 2010   atrial fibrillation   ROTATOR CUFF REPAIR     left   SHOULDER OPEN ROTATOR CUFF REPAIR     right   SPINE SURGERY     WRIST ARTHROCENTESIS     left   Current Outpatient Medications on File Prior to Visit  Medication Sig Dispense Refill   AIMOVIG 140 MG/ML SOAJ Inject 140 mg into the muscle every 30 (thirty) days.     amLODipine (NORVASC) 10 MG tablet Take 1 tablet (10 mg total) by mouth daily. 90 tablet 3   aspirin 81 MG tablet Take 81 mg by mouth daily.     cholecalciferol (VITAMIN D) 1000 UNITS tablet Take 1,000 Units by mouth daily.     citalopram (CELEXA) 40 MG tablet TAKE 1 TABLET(40 MG) BY MOUTH DAILY 90 tablet 3   Coenzyme Q10 (CO Q 10) 100 MG CAPS Take 1 capsule by mouth daily.     fexofenadine (ALLEGRA) 180 MG tablet Take 180 mg by mouth daily.      fluticasone (FLONASE) 50 MCG/ACT nasal spray INSTILL 2 SPRAYS IN THE  NOSE AT BEDTIME (Patient taking differently: Place 2 sprays into both nostrils at bedtime. ) 48 g 3   furosemide (LASIX) 40 MG tablet TAKE 1 TABLET BY MOUTH  DAILY (Patient taking differently: Take 40 mg by mouth daily. ) 90 tablet 3   nitroGLYCERIN (NITROSTAT) 0.4 MG SL tablet PLACE ONE TABLET UNDER THE TONGUE EVERY 5 MINUTES AS NEEDED FOR CHEST PAIN 25 tablet 3   oxyCODONE-acetaminophen (PERCOCET) 10-325 MG tablet Take 1 tablet by mouth  every 8 (eight) hours as needed for pain. 21 tablet 0   pantoprazole (PROTONIX) 40 MG tablet TAKE 1 TABLET BY MOUTH  EVERY DAY 90 tablet 3   potassium chloride (MICRO-K) 10 MEQ CR capsule TAKE 1 CAPSULE BY MOUTH  EVERY DAY 90 capsule 3   rosuvastatin (CRESTOR) 40 MG tablet TAKE 1 TABLET BY MOUTH  DAILY 90 tablet 1   Suvorexant (BELSOMRA) 10 MG TABS Take 1 tablet by mouth at bedtime as needed (insomnia). 30 tablet 0   telmisartan (MICARDIS) 40 MG tablet TAKE 1  TABLET BY MOUTH  DAILY 90 tablet 3   traMADol (ULTRAM) 50 MG tablet TAKE 1 TABLET(50 MG) BY MOUTH EVERY 8 HOURS AS NEEDED 30 tablet 0   zolpidem (AMBIEN) 10 MG tablet TAKE 1 TABLET(10 MG) BY MOUTH AT BEDTIME AS NEEDED FOR SLEEP 30 tablet 2   No current facility-administered medications on file prior to visit.    Allergies  Allergen Reactions   Ibuprofen Swelling   Other Shortness Of Breath    BETA BLOCKERS   Topamax [Topiramate] Other (See Comments)    Pt states it made his tongue feel "scalded" and he couldn't eat    Toprol Xl [Metoprolol Succinate] Other (See Comments)    Personality change   Metoprolol-Hctz Er Other (See Comments)    Indigestion,mouth raw   Social History   Socioeconomic History   Marital status: Married    Spouse name: Not on file   Number of children: 2   Years of education: Not on file   Highest education level: Not on file  Occupational History   Occupation: retired  Scientist, product/process development strain: Not on file   Food insecurity    Worry: Not on file    Inability: Not on file   Transportation needs    Medical: Not on file    Non-medical: Not on file  Tobacco Use   Smoking status: Former Smoker    Quit date: 06/01/1987    Years since quitting: 31.9   Smokeless tobacco: Never Used  Substance and Sexual Activity   Alcohol use: Yes    Alcohol/week: 0.0 standard drinks    Comment: once every 6-7 months   Drug use: No   Sexual activity: Not on file  Lifestyle     Physical activity    Days per week: Not on file    Minutes per session: Not on file   Stress: Not on file  Relationships   Social connections    Talks on phone: Not on file    Gets together: Not on file    Attends religious service: Not on file    Active member of club or organization: Not on file    Attends meetings of clubs or organizations: Not on file    Relationship status: Not on file   Intimate partner violence    Fear of current or ex partner: Not on file    Emotionally abused: Not on file    Physically abused: Not on file    Forced sexual activity: Not on file  Other Topics Concern   Not on file  Social History Narrative   Not on file      Review of Systems  All other systems reviewed and are negative.      Objective:   Physical Exam Vitals signs reviewed.  Constitutional:      General: He is not in acute distress.    Appearance: He is diaphoretic. He is not ill-appearing or toxic-appearing.  Cardiovascular:     Rate and Rhythm: Bradycardia present.     Heart sounds: Normal heart sounds.  Pulmonary:     Effort: Pulmonary effort is normal. No tachypnea or respiratory distress.     Breath sounds: No stridor.  Musculoskeletal:     Right lower leg: No edema.     Left lower leg: No edema.  Neurological:     Mental Status: He is alert.           Assessment & Plan:  Other chest pain -  Plan: EKG 12-Lead, aspirin chewable tablet 324 mg  CAD S/P percutaneous coronary angioplasty  Symptomatic bradycardia  Patient is having angina with activity, near syncope with activity and appears to be having symptomatic bradycardia.  His chest pain resolved with rest and so he was not given nitroglycerin.  He was given for baby aspirin and told to chew them upon arrival.  EMS was contacted and the patient will be sent to the emergency room for evaluation.  His symptoms appear consistent with symptomatic bradycardia however acute coronary syndrome needs to be  ruled out conclusively.

## 2019-04-30 NOTE — ED Provider Notes (Signed)
  Physical Exam  BP 129/81   Pulse (!) 55   Temp 98.4 F (36.9 C) (Oral)   Resp 17   SpO2 97%   Physical Exam  ED Course/Procedures   Clinical Course as of Apr 29 1504  Mon Apr 30, 2019  1436 Cards visit Sept 2020: He underwent cardiac catheterization this chest pain which was done by Dr. [name] on 06/29/2013. Catheterization did not demonstrate significant restenosis of his LAD stents with mild 20% narrowing in the proximal stent and 40% mid stent. The circumflex was normal. His RCA had 40% narrowing just proximal to a widely patent stent in the mid vessel.   [HK]    Clinical Course User Index [HK] Delia Heady, PA-C    Procedures  MDM  Patient care assumed from [facility] PA at shift change, please see her note for a full HPI.Briefly, patient here with symptomatic bradycardia, consistent of shortness of breath, lightheadedness, diaphoresis. Exacerbated with ambulation, alleviated while sitting.  He does have 2 prior admissions for the same complaint.  He is followed by cardiology Dr. Claiborne Billings along with Dr. Rayann Heman.  Plan is for patient to have cardiology see him, follow-up on lab work.  03:50PM Cardiology will be evaluating patient in the ED. CBC is unremarkable, hemoglobin at baseline. CMP with an elevated glucose. LFTs are normal. Magnesium at baseline. First troponin is normal.  5:54 PM Second troponin is negative, cardiology at the bedside evaluating patient.  6:16PM cardiology Dr. Davina Poke to admit patient for further management, appreciate his services.  Portions of this note were generated with [employer]obbyist. Dictation errors may occur despite best attempts at proofreading.       Janeece Fitting, PA-C 04/30/19 [place], [place], DO 04/30/19 2011

## 2019-04-30 NOTE — H&P (Signed)
Cardiology Admission History and Physical:  Patient ID: Lee [name] MRN: [id number] DOB: 1951/03/21  Admit date: 04/30/2019  Primary Care Provider: Susy Frizzle, MD Primary Cardiologist: Shelva Majestic, MD Primary Electrophysiologist:  [name], MD  Chief Complaint: Symptomatic bradycardia  Patient Profile:  Lee [name] is a 68 y.o. male with history of CAD status post PCI, paroxysmal atrial fibrillation status post A. fib ablation not on anticoagulation, intermittent complete heart block with watchful waiting, hypertension, likely diabetes who presents with 2 days of worsening shortness of breath with exertion.  History of Present Illness:  Mr. Lee [name] reports for the past 2 days he has become short of breath and dizzy with exercise.  He has been followed by our electrophysiology team for intermittent complete heart block but has not undergone pacemaker implantation.  He underwent a period of watchful waiting with out AV nodal agents and reports he has been doing well since 2 days ago.  He reports with exertion or with activity he does get dizzy.  He describes no frank syncope.  He reports no episodes of chest pain or chest pressure.  Laboratory data here shows normal serum creatinine, white count 7.0, hemoglobin 13.8, normal platelets.  Chest x-ray without any evidence of acute abnormality.  Troponins are negative x2.  Review of his EKGs demonstrates sinus rhythm with right bundle branch block and intermittent 2:1 block.  He has a baseline first-degree AV block with right bundle branch block.  Most recent echocardiogram demonstrates normal left ventricular function.  He has not been on a DOAC or any blood thinners as this was stopped as no further recurrence of atrial fibrillation has occurred.  He also has not received any heparin products when I can tell.  Heart Pathway Score:       Past Medical History: Past Medical History:  Diagnosis Date  . Allergic rhinitis    chronic sinusitis  . Arthritis   . CAD (coronary artery disease)    a. 2009 PCI/DES to mLAD; b. 05/2010 s/p DES RCA and LAD.; c. 05/2013 Cath: LAD mild ISR, LCX nl, RCA 40p, patent stents.  . Cataract    has had lasik surg  . COPD (chronic obstructive pulmonary disease) ([place])   . Diabetes mellitus type 2, controlled, without complications ([place])   . Diverticulosis   . DJD (degenerative joint disease)   . Esophagitis 1991   grade 1  . GERD (gastroesophageal reflux disease)    Hiatal hernia  . Hemorrhoids   . Hyperlipidemia   . Hypertensive heart disease   . Iron deficiency anemia   . Migraines   . Paroxysmal atrial fibrillation ([facility])    a. s/p afib ablation 10/22/08 ([facility]).  . PVC's (premature ventricular contractions)   . Sleep apnea    Questionalble: RDI during the total sleep time 6h 28 mins was 3.55/hr during REM sleep was at 5.71/hr. Supine AHI was 5.93/hr.  . Syncope 08/2018    Past Surgical History: Past Surgical History:  Procedure Laterality Date  . CARDIAC CATHETERIZATION  05/2010   The proximal LAD was then predilated with 2.0 x 12 trek. this was then stented with a 2.5 x 16 promus Element drug-eluting stent at 14 atmosphere and prostdilated with 2.75 x 12 Donahue trek at 16 atmospheres(2.8 mm) resulting the reduction of 80% proximal LAD stenosis to 0% residual with excellent flow.  Marland Kitchen CARDIAC CATHETERIZATION  06/05/2010   Successful percutaneous coronary intervention to the right coronary artery with percutaneous transluminal coronary angioplasty/stenting and  insertion 3.0 x 16 mm Promus DES post dilated to 3.35 mm with stenosis being reduced to 0%  . CARDIAC CATHETERIZATION  02/2008   Post dilatation was performed using a 2.75 x 9  sprinter, 10 atmospheres for 40 seconds and then 9 atmosphere for 35 sec. This resulted in the 80% area of narrowing pre-intervention, now appearing to normal. There was no edvidence of the dissection or thrombus and there was TIMI III flow pre  and post.  . CARDIAC CATHETERIZATION  02/2006  . CORONARY ANGIOPLASTY WITH STENT PLACEMENT     3 stents/ 4 caths  . EYE SURGERY    . KNEE ARTHROSCOPY     right  . LASIK    . LEFT HEART CATHETERIZATION WITH CORONARY ANGIOGRAM N/A 06/29/2013   Procedure: LEFT HEART CATHETERIZATION WITH CORONARY ANGIOGRAM;  Surgeon: Leonie Man, MD;  Location: [facility] CATH LAB;  Service: Cardiovascular;  Laterality: N/A;  . NASAL SINUS SURGERY  2001  . NECK SURGERY     Cervical fusion  . RADIOFREQUENCY ABLATION  May 2010   atrial fibrillation  . ROTATOR CUFF REPAIR     left  . SHOULDER OPEN ROTATOR CUFF REPAIR     right  . SPINE SURGERY    . WRIST ARTHROCENTESIS     left     Medications Prior to Admission: Prior to Admission medications   Medication Sig Start Date End Date Taking? Authorizing Provider  AIMOVIG 140 MG/ML SOAJ Inject 140 mg into the muscle every 30 (thirty) days. 08/21/18  Yes [provider]  amLODipine (NORVASC) 10 MG tablet Take 1 tablet (10 mg total) by mouth daily. 10/06/18  Yes Susy Frizzle, MD  aspirin 81 MG tablet Take 81 mg by mouth daily.   Yes [provider]  cholecalciferol (VITAMIN D) 1000 UNITS tablet Take 1,000 Units by mouth daily.   Yes [provider]  citalopram (CELEXA) 40 MG tablet TAKE 1 TABLET(40 MG) BY MOUTH DAILY 11/06/18  Yes Susy Frizzle, MD  Coenzyme Q10 (CO Q 10) 100 MG CAPS Take 1 capsule by mouth daily.   Yes [provider]  fexofenadine (ALLEGRA) 180 MG tablet Take 180 mg by mouth daily.    Yes [provider]  fluticasone (FLONASE) 50 MCG/ACT nasal spray INSTILL 2 SPRAYS IN THE  NOSE AT BEDTIME Patient taking differently: Place 2 sprays into both nostrils at bedtime.  04/06/17  Yes Susy Frizzle, MD  furosemide (LASIX) 40 MG tablet TAKE 1 TABLET BY MOUTH  DAILY Patient taking differently: Take 40 mg by mouth daily.  04/14/18  Yes Troy Sine, MD  nitroGLYCERIN (NITROSTAT) 0.4 MG SL tablet PLACE  ONE TABLET UNDER THE TONGUE EVERY 5 MINUTES AS NEEDED FOR CHEST PAIN 02/13/19  Yes Troy Sine, MD  oxyCODONE-acetaminophen (PERCOCET) 10-325 MG tablet Take 1 tablet by mouth every 8 (eight) hours as needed for pain. 03/29/19  Yes Susy Frizzle, MD  pantoprazole (PROTONIX) 40 MG tablet TAKE 1 TABLET BY MOUTH  EVERY DAY 03/21/19  Yes Troy Sine, MD  potassium chloride (MICRO-K) 10 MEQ CR capsule TAKE 1 CAPSULE BY MOUTH  EVERY DAY 03/21/19  Yes Troy Sine, MD  rosuvastatin (CRESTOR) 40 MG tablet TAKE 1 TABLET BY MOUTH  DAILY 03/13/19  Yes Troy Sine, MD  Suvorexant (BELSOMRA) 10 MG TABS Take 1 tablet by mouth at bedtime as needed (insomnia). 03/20/19  Yes Susy Frizzle, MD  telmisartan (MICARDIS) 40 MG tablet TAKE 1  TABLET BY MOUTH  DAILY 03/19/19  Yes Troy Sine, MD  traMADol (ULTRAM) 50 MG tablet TAKE 1 TABLET(50 MG) BY MOUTH EVERY 8 HOURS AS NEEDED 11/28/18  Yes [place], Modena Nunnery, MD  zolpidem (AMBIEN) 10 MG tablet TAKE 1 TABLET(10 MG) BY MOUTH AT BEDTIME AS NEEDED FOR SLEEP 02/26/19  Yes Susy Frizzle, MD     Allergies:    Allergies  Allergen Reactions  . Ibuprofen Swelling  . Other Shortness Of Breath    BETA BLOCKERS  . Topamax [Topiramate] Other (See Comments)    Pt states it made his tongue feel "scalded" and he couldn't eat   . Toprol Xl [Metoprolol Succinate] Other (See Comments)    Personality change  . Metoprolol-Hctz Er Other (See Comments)    Indigestion,mouth raw    Social History:   Social History   Socioeconomic History  . Marital status: Married    Spouse name: Not on file  . Number of children: 2  . Years of education: Not on file  . Highest education level: Not on file  Occupational History  . Occupation: retired  Scientific laboratory technician  . Financial resource strain: Not on file  . Food insecurity    Worry: Not on file    Inability: Not on file  . Transportation needs    Medical: Not on file    Non-medical: Not on file  Tobacco Use   . Smoking status: Former Smoker    Quit date: 06/01/1987    Years since quitting: 31.9  . Smokeless tobacco: Never Used  Substance and Sexual Activity  . Alcohol use: Yes    Alcohol/week: 0.0 standard drinks    Comment: once every 6-7 months  . Drug use: No  . Sexual activity: Not on file  Lifestyle  . Physical activity    Days per week: Not on file    Minutes per session: Not on file  . Stress: Not on file  Relationships  . Social Herbalist on phone: Not on file    Gets together: Not on file    Attends religious service: Not on file    Active member of club or organization: Not on file    Attends meetings of clubs or organizations: Not on file    Relationship status: Not on file  . Intimate partner violence    Fear of current or ex partner: Not on file    Emotionally abused: Not on file    Physically abused: Not on file    Forced sexual activity: Not on file  Other Topics Concern  . Not on file  Social History Narrative  . Not on file     Family History:  The patient's family history includes Colon cancer in his mother; Diabetes in his father; Heart disease in his father and sister; Uterine cancer in his mother.    ROS:  All other ROS reviewed and negative. Pertinent positives noted in the HPI.     Physical Exam/Data:   Vitals:   04/30/19 1615 04/30/19 1645 04/30/19 1700 04/30/19 1730  BP: 136/77 120/86 (!) 125/91 129/84  Pulse: 78 83 81 80  Resp: 15 16 17 18   Temp:      TempSrc:      SpO2: 97% 99% 100% 99%   No intake or output data in the 24 hours ending 04/30/19 1816  Last 3 Weights 02/13/2019 12/07/2018 11/16/2018  Weight (lbs) 262 lb 12.8 oz 265 lb 6.4 oz 263 lb 12.8  oz  Weight (kg) 119.205 kg 120.385 kg 119.659 kg    There is no height or weight on file to calculate BMI.  General: Well nourished, well developed, in no acute distress Heart: Atraumatic, normal size  Eyes: PEERLA, EOMI  Neck: Supple, no JVD Endocrine: No thryomegaly Cardiac:  Normal S1, S2; RRR; no murmurs, rubs, or gallops Lungs: Clear to auscultation bilaterally, no wheezing, rhonchi or rales  Abd: Soft, nontender, no hepatomegaly  Ext: No edema, pulses 2+ Musculoskeletal: No deformities, BUE and BLE strength normal and equal Skin: Warm and dry, no rashes   Neuro: Alert and oriented to person, place, time, and situation, CNII-XII grossly intact, no focal deficits  Psych: Normal mood and affect   EKG:  The ECG that was done was personally reviewed and demonstrates normal sinus rhythm with 2-1 AV block.  He also has EKGs demonstrate normal sinus rhythm with right bundle branch block and first-degree AV block with heart rate in the 80s.  Relevant CV Studies: TTE 02/23/2019  1. Left ventricular ejection fraction, by visual estimation, is 55 to 60%. The left ventricle has normal function. Normal left ventricular size. There is no left ventricular hypertrophy.  2. Left ventricular diastolic Doppler parameters are consistent with impaired relaxation pattern of LV diastolic filling.  3. Global right ventricle has normal systolic function.The right ventricular size is normal.  4. Left atrial size was normal.  5. The interatrial septum is aneurysmal.  6. Right atrial size was normal.  7. The mitral valve is normal in structure. No evidence of mitral valve regurgitation. No evidence of mitral stenosis.  8. The tricuspid valve is normal in structure. Tricuspid valve regurgitation was not visualized by color flow Doppler.  9. The aortic valve is tricuspid Aortic valve regurgitation was not visualized by color flow Doppler. Mild aortic valve stenosis. 10. The pulmonic valve was not well visualized. Pulmonic valve regurgitation is not visualized by color flow Doppler. 11. There is mild dilatation of the aortic root measuring 39 mm. 12. The inferior vena cava is normal in size with greater than 50% respiratory variability, suggesting right atrial pressure of 3 mmHg. 13.  Definity used; normal LV systolic function; grade 1 diastolic dysfunction; mildly dilated aortic root; calcified aortic valve with very mild AS (peak velocity 2.2 m/s; mean gradient 11 mmHg). 14. Aortic dilatation noted.  Laboratory Data: High Sensitivity Troponin:   Recent Labs  Lab 04/30/19 1400 04/30/19 1600  TROPONINIHS 5 5      Cardiac EnzymesNo results for input(s): TROPONINI in the last 168 hours. No results for input(s): TROPIPOC in the last 168 hours.  Chemistry Recent Labs  Lab 04/30/19 1400  NA 134*  K 4.5  CL 99  CO2 26  GLUCOSE 252*  BUN 12  CREATININE 0.81  CALCIUM 9.2  GFRNONAA >60  GFRAA >60  ANIONGAP 9    Recent Labs  Lab 04/30/19 1400  PROT 6.5  ALBUMIN 4.0  AST 26  ALT 34  ALKPHOS 67  BILITOT 0.4   Hematology Recent Labs  Lab 04/30/19 1400  WBC 7.0  RBC 4.71  HGB 13.8  HCT 42.1  MCV 89.4  MCH 29.3  MCHC 32.8  RDW 11.9  PLT 226   BNPNo results for input(s): BNP, PROBNP in the last 168 hours.  DDimer No results for input(s): DDIMER in the last 168 hours.  Radiology/Studies:  Dg Chest Portable 1 View  Result Date: 04/30/2019 CLINICAL DATA:  Shortness of breath. EXAM: PORTABLE CHEST 1 VIEW  COMPARISON:  Oct 10, 2018. FINDINGS: The heart size and mediastinal contours are within normal limits. Both lungs are clear. No pneumothorax or pleural effusion is noted. The visualized skeletal structures are unremarkable. IMPRESSION: No active disease. Electronically Signed   By: Marijo Conception M.D.   On: 04/30/2019 14:26    Assessment and Plan:  1. Symptomatic high-grade 2:1 AV block: His EKG at baseline demonstrates a first-degree AV block with right bundle branch block morphology.  He is demonstrated here on his EKG to have intermittent high-grade AV block 2-1 conduction.  He also shows this intermittently on his telemetry.  Overall given his symptoms and findings on his EKG I think he likely will merit pacemaker implantation tomorrow.  We will  admit him to our service hold all AV nodal agents, hold any heparin products, and keep him n.p.o. at midnight for potential pacemaker implantation.  We will have him evaluated by electrophysiology in the morning.  We will plan to repeat an echocardiogram in the morning as well. 2. Hypertension: We will continue his home ARB and amlodipine. 3. Hyperglycemia: He has been told he is prediabetic in the past we will check an A1c and cover him with sign scale insulin for now. 4. CAD: History of this in the past.  Most recent nuclear medicine myocardial perfusion imaging study in 2017 demonstrated no ischemia.  He has no complaints of chest pain and I do not suspect angina or ACS in this patient. 5. Paroxysmal atrial fibrillation: Status post ablation in 2010.  He had no further recurrence.  He is not anticoagulation.  We will not start this now.  FEN -No intravenous fluids -General diet, n.p.o. at midnight -DVT PPx: We will use SCDs and hold any heparin products as he likely will need pacemaker tomorrow -Code: Full  Severity of Illness: The appropriate patient status for this patient is INPATIENT. Inpatient status is judged to be reasonable and necessary in order to provide the required intensity of service to ensure the patient's safety. The patient's presenting symptoms, physical exam findings, and initial radiographic and laboratory data in the context of their chronic comorbidities is felt to place them at high risk for further clinical deterioration. Furthermore, it is not anticipated that the patient will be medically stable for discharge from the hospital within 2 midnights of admission. The following factors support the patient status of inpatient.   " The patient's presenting symptoms include symptomatic high-grade AV block. " The worrisome physical exam findings include EKG findings with high-grade AV block. " The initial radiographic and laboratory data are worrisome because of EKG with  high-grade AV block. " The chronic co-morbidities include CAD, hypertension, paroxysmal atrial fibrillation.   * I certify that at the point of admission it is my clinical judgment that the patient will require inpatient hospital care spanning beyond 2 midnights from the point of admission due to high intensity of service, high risk for further deterioration and high frequency of surveillance required.*    For questions or updates, please contact [place] Please consult [email or web address] for contact info under       Signed, [place] T. Audie Box, [place]  04/30/2019 6:16 PM

## 2019-04-30 NOTE — ED Triage Notes (Signed)
Pt presents for symptomatic bradycardia with exertion x2 days. PCP instructed to call 911, EMS arrived to pt in 40s, after 10 min of rest returned to 80s.  Had a similar episode of bradycardia with exertion earlier this year, resolved after stopping diltiazem and carvedilol. H/o 3 stents. BP 110/70, 87bpm, 94% RA, RR19bpm. Takes amlodipine, has not had today

## 2019-04-30 NOTE — ED Provider Notes (Signed)
[facility] EMERGENCY DEPARTMENT Provider Note   CSN: [id number] Arrival date & time: 04/30/19  1337     History   Chief Complaint Chief Complaint  Patient presents with  . Bradycardia    HPI Lee [name] is a 68 y.o. male with a past medical history of CAD s/p stent x3 most recent cath 2015, presents to ED with a chief complaint of shortness of breath, lightheadedness and bradycardia.  Yesterday was taking a walk with his wife when he became extremely lightheaded, bradycardic to the 30s and diaphoretic.  His symptoms gradually resolved when he sat down.  States that his symptoms have been persistent anytime he tries to walk or any type of exertion since yesterday.  States that this is happened him twice in the past, once in April 2020 and then again in May 2020.  He was subsequently taken off of his diltiazem and carvedilol at these 2 instances.  He is now on amlodipine which he reports compliance with.  He denies any chest pain.  He was seen by his PCP this morning was sent to the ED for concerns of symptomatic bradycardia and ACS work-up.  He denies any injuries or falls, headache, vision changes, leg swelling.     HPI  Past Medical History:  Diagnosis Date  . Allergic rhinitis    chronic sinusitis  . Arthritis   . CAD (coronary artery disease)    a. 2009 PCI/DES to mLAD; b. 05/2010 s/p DES RCA and LAD.; c. 05/2013 Cath: LAD mild ISR, LCX nl, RCA 40p, patent stents.  . Cataract    has had lasik surg  . COPD (chronic obstructive pulmonary disease) ([place])   . Diabetes mellitus type 2, controlled, without complications ([place])   . Diverticulosis   . DJD (degenerative joint disease)   . Esophagitis 1991   grade 1  . GERD (gastroesophageal reflux disease)    Hiatal hernia  . Hemorrhoids   . Hyperlipidemia   . Hypertensive heart disease   . Iron deficiency anemia   . Migraines   . Paroxysmal atrial fibrillation ([facility])    a. s/p afib ablation 10/22/08 ([facility]).  . PVC's (premature ventricular contractions)   . Sleep apnea    Questionalble: RDI during the total sleep time 6h 28 mins was 3.55/hr during REM sleep was at 5.71/hr. Supine AHI was 5.93/hr.  . Syncope 08/2018    Patient Active Problem List   Diagnosis Date Noted  . Syncope and collapse 10/10/2018  . Heart block AV complete ([place]) 10/10/2018  . Symptomatic bradycardia 10/10/2018  . Syncope, vasovagal 09/20/2018  . Neck mass 10/14/2016  . Depression 05/20/2016  . Hypertensive heart disease   . Paroxysmal atrial fibrillation (H[facility])   . Hyperlipidemia LDL goal <70 03/27/2014  . Premature ventricular contraction 03/04/2014  . Chest pain with high risk of acute coronary syndrome 06/28/2013  . Morbid obesity ([place]) 06/28/2013  . OSA (obstructive sleep apnea) 12/18/2012  . Sleepiness 11/06/2012  . Fatigue 11/06/2012  . PALPITATIONS 06/02/2009  . ATRIAL FIBRILLATION 10/16/2008  . DYSLIPIDEMIA 09/05/2008  . Essential hypertension 09/05/2008  . CAD S/P percutaneous coronary angioplasty 09/05/2008  . RBBB 09/05/2008  . SUPRAVENTRICULAR TACHYCARDIA 09/05/2008  . ALLERGIC RHINITIS 09/05/2008  . GERD 09/05/2008  . BACK PAIN 09/05/2008  . ARRHYTHMIA, HX OF 09/05/2008  . MIGRAINES, HX OF 09/05/2008  . Dyslipidemia 09/05/2008    Past Surgical History:  Procedure Laterality Date  . CARDIAC CATHETERIZATION  05/2010   The  proximal LAD was then predilated with 2.0 x 12 trek. this was then stented with a 2.5 x 16 promus Element drug-eluting stent at 14 atmosphere and prostdilated with 2.75 x 12 [place] trek at 16 atmospheres(2.8 mm) resulting the reduction of 80% proximal LAD stenosis to 0% residual with excellent flow.  Marland Kitchen CARDIAC CATHETERIZATION  06/05/2010   Successful percutaneous coronary intervention to the right coronary artery with percutaneous transluminal coronary angioplasty/stenting and insertion 3.0 x 16 mm Promus DES post dilated to 3.35 mm with stenosis being reduced to 0%  .  CARDIAC CATHETERIZATION  02/2008   Post dilatation was performed using a 2.75 x 9 [place] sprinter, 10 atmospheres for 40 seconds and then 9 atmosphere for 35 sec. This resulted in the 80% area of narrowing pre-intervention, now appearing to normal. There was no edvidence of the dissection or thrombus and there was TIMI III flow pre and post.  . CARDIAC CATHETERIZATION  02/2006  . CORONARY ANGIOPLASTY WITH STENT PLACEMENT     3 stents/ 4 caths  . EYE SURGERY    . KNEE ARTHROSCOPY     right  . LASIK    . LEFT HEART CATHETERIZATION WITH CORONARY ANGIOGRAM N/A 06/29/2013   Procedure: LEFT HEART CATHETERIZATION WITH CORONARY ANGIOGRAM;  Surgeon: Leonie Man, MD;  Location: [facility] CATH LAB;  Service: Cardiovascular;  Laterality: N/A;  . NASAL SINUS SURGERY  2001  . NECK SURGERY     Cervical fusion  . RADIOFREQUENCY ABLATION  May 2010   atrial fibrillation  . ROTATOR CUFF REPAIR     left  . SHOULDER OPEN ROTATOR CUFF REPAIR     right  . SPINE SURGERY    . WRIST ARTHROCENTESIS     left        Home Medications    Prior to Admission medications   Medication Sig Start Date End Date Taking? Authorizing Provider  AIMOVIG 140 MG/ML SOAJ Inject 140 mg into the muscle every 30 (thirty) days. 08/21/18   [provider]  amLODipine (NORVASC) 10 MG tablet Take 1 tablet (10 mg total) by mouth daily. 10/06/18   Susy Frizzle, MD  aspirin 81 MG tablet Take 81 mg by mouth daily.    [provider]  cholecalciferol (VITAMIN D) 1000 UNITS tablet Take 1,000 Units by mouth daily.    [provider]  citalopram (CELEXA) 40 MG tablet TAKE 1 TABLET(40 MG) BY MOUTH DAILY 11/06/18   Susy Frizzle, MD  Coenzyme Q10 (CO Q 10) 100 MG CAPS Take 1 capsule by mouth daily.    [provider]  fexofenadine (ALLEGRA) 180 MG tablet Take 180 mg by mouth daily.     [provider]  fluticasone (FLONASE) 50 MCG/ACT nasal spray INSTILL 2 SPRAYS IN THE  NOSE AT BEDTIME Patient  taking differently: Place 2 sprays into both nostrils at bedtime.  04/06/17   Susy Frizzle, MD  furosemide (LASIX) 40 MG tablet TAKE 1 TABLET BY MOUTH  DAILY Patient taking differently: Take 40 mg by mouth daily.  04/14/18   Troy Sine, MD  nitroGLYCERIN (NITROSTAT) 0.4 MG SL tablet PLACE ONE TABLET UNDER THE TONGUE EVERY 5 MINUTES AS NEEDED FOR CHEST PAIN 02/13/19   Troy Sine, MD  oxyCODONE-acetaminophen (PERCOCET) 10-325 MG tablet Take 1 tablet by mouth every 8 (eight) hours as needed for pain. 03/29/19   Susy Frizzle, MD  pantoprazole (PROTONIX) 40 MG tablet TAKE 1 TABLET BY MOUTH  EVERY DAY 03/21/19  Troy Sine, MD  potassium chloride (MICRO-K) 10 MEQ CR capsule TAKE 1 CAPSULE BY MOUTH  EVERY DAY 03/21/19   Troy Sine, MD  rosuvastatin (CRESTOR) 40 MG tablet TAKE 1 TABLET BY MOUTH  DAILY 03/13/19   Troy Sine, MD  Suvorexant (BELSOMRA) 10 MG TABS Take 1 tablet by mouth at bedtime as needed (insomnia). 03/20/19   Susy Frizzle, MD  telmisartan (MICARDIS) 40 MG tablet TAKE 1 TABLET BY MOUTH  DAILY 03/19/19   Troy Sine, MD  traMADol (ULTRAM) 50 MG tablet TAKE 1 TABLET(50 MG) BY MOUTH EVERY 8 HOURS AS NEEDED 11/28/18   [place], Modena Nunnery, MD  zolpidem (AMBIEN) 10 MG tablet TAKE 1 TABLET(10 MG) BY MOUTH AT BEDTIME AS NEEDED FOR SLEEP 02/26/19   Susy Frizzle, MD    Family History Family History  Problem Relation Age of Onset  . Heart disease Father        and sister  . Diabetes Father   . Colon cancer Mother        mets from uterine  . Uterine cancer Mother   . Heart disease Sister     Social History Social History   Tobacco Use  . Smoking status: Former Smoker    Quit date: 06/01/1987    Years since quitting: 31.9  . Smokeless tobacco: Never Used  Substance Use Topics  . Alcohol use: Yes    Alcohol/week: 0.0 standard drinks    Comment: once every 6-7 months  . Drug use: No     Allergies   Ibuprofen, Other, Topamax [topiramate],  Toprol xl [metoprolol succinate], and Metoprolol-hctz er   Review of Systems Review of Systems  Constitutional: Negative for appetite change, chills and fever.  HENT: Negative for ear pain, rhinorrhea, sneezing and sore throat.   Eyes: Negative for photophobia and visual disturbance.  Respiratory: Positive for shortness of breath. Negative for cough, chest tightness and wheezing.   Cardiovascular: Negative for chest pain and palpitations.  Gastrointestinal: Negative for abdominal pain, blood in stool, constipation, diarrhea, nausea and vomiting.  Genitourinary: Negative for dysuria, hematuria and urgency.  Musculoskeletal: Negative for myalgias.  Skin: Negative for rash.  Neurological: Positive for light-headedness. Negative for dizziness and weakness.     Physical Exam Updated Vital Signs BP 127/83   Pulse 81   Temp 98.4 F (36.9 C) (Oral)   Resp 15   SpO2 100%   Physical Exam Vitals signs and nursing note reviewed.  Constitutional:      General: He is not in acute distress.    Appearance: He is well-developed.  HENT:     Head: Normocephalic and atraumatic.     Nose: Nose normal.  Eyes:     General: No scleral icterus.       Left eye: No discharge.     Conjunctiva/sclera: Conjunctivae normal.  Neck:     Musculoskeletal: Normal range of motion and neck supple.  Cardiovascular:     Rate and Rhythm: Regular rhythm. Bradycardia present.     Heart sounds: Normal heart sounds. No murmur. No friction rub. No gallop.   Pulmonary:     Effort: Pulmonary effort is normal. No respiratory distress.     Breath sounds: Normal breath sounds.  Abdominal:     General: Bowel sounds are normal. There is no distension.     Palpations: Abdomen is soft.     Tenderness: There is no abdominal tenderness. There is no guarding.  Musculoskeletal: Normal range of motion.  Skin:    General: Skin is warm and dry.     Findings: No rash.  Neurological:     Mental Status: He is alert.      Motor: No abnormal muscle tone.     Coordination: Coordination normal.      ED Treatments / Results  Labs (all labs ordered are listed, but only abnormal results are displayed) Labs Reviewed  CBC WITH DIFFERENTIAL/PLATELET  COMPREHENSIVE METABOLIC PANEL  MAGNESIUM  TROPONIN I (HIGH SENSITIVITY)    EKG EKG Interpretation  Date/Time:  Monday April 30 2019 13:45:38 EST Ventricular Rate:  85 PR Interval:    QRS Duration: 135 QT Interval:  391 QTC Calculation: 465 R Axis:   19 Text Interpretation: Sinus rhythm Prolonged PR interval Right bundle branch block ST elevation, consider inferior injury No significant change was found Confirmed by Gerlene Fee [phone]) on 04/30/2019 1:51:28 PM   Radiology Dg Chest Portable 1 View  Result Date: 04/30/2019 CLINICAL DATA:  Shortness of breath. EXAM: PORTABLE CHEST 1 VIEW COMPARISON:  Oct 10, 2018. FINDINGS: The heart size and mediastinal contours are within normal limits. Both lungs are clear. No pneumothorax or pleural effusion is noted. The visualized skeletal structures are unremarkable. IMPRESSION: No active disease. Electronically Signed   By: Marijo Conception M.D.   On: 04/30/2019 14:26    Procedures Procedures (including critical care time)  Medications Ordered in ED Medications - No data to display   Initial Impression / Assessment and Plan / ED Course  I have reviewed the triage vital signs and the nursing notes.  Pertinent labs & imaging results that were available during my care of the patient were reviewed by me and considered in my medical decision making (see chart for details).  Clinical Course as of Apr 30 1511  Mon Apr 30, 2019  1436 Cards visit Sept 2020: He underwent cardiac catheterization this chest pain which was done by Dr. [name] on 06/29/2013. Catheterization did not demonstrate significant restenosis of his LAD stents with mild 20% narrowing in the proximal stent and 40% mid stent. The circumflex  was normal. His RCA had 40% narrowing just proximal to a widely patent stent in the mid vessel.   [HK]    Clinical Course User Index [HK] Delia Heady, [place]       68yo M with a past medical history of CAD s/p stent placement x3, presents to ED with a chief complaint of shortness of breath and lightheadedness. Symptoms began yesterday when he was walking outside with his wife.His symptoms improve with rest and laying down. Sent here by PCP for symptomatic bradycardia, found to have HR of 40s today while in office. He denies chest pain to me during my evaluation. No deficits to neurological exam noted. He is bradycardic here to 40s. Will obtain labwork and consult cardiology. Care handed off to oncoming provider pending consult.  Final Clinical Impressions(s) / ED Diagnoses   Final diagnoses:  Symptomatic bradycardia    ED Discharge Orders    None     Portions of this note were generated with Dragon dictation software. Dictation errors may occur despite best attempts at proofreading.    Delia Heady, PA-C 04/30/19 1512    Maudie Flakes, MD 05/01/19 0700

## 2019-04-30 NOTE — ED Notes (Signed)
Noted that pt HR dropped to 46 while speaking to the provider, EKG obtained during this, returned to 84bpm 5 min after this

## 2019-05-01 ENCOUNTER — Encounter (HOSPITAL_COMMUNITY)
Admission: EM | Disposition: A | Discharge status: Home / Self Care | Payer: Self-pay | Source: Home / Self Care | Attending: Cardiovascular Disease

## 2019-05-01 ENCOUNTER — Inpatient Hospital Stay (HOSPITAL_COMMUNITY): Payer: Medicare Other

## 2019-05-01 DIAGNOSIS — I442 Atrioventricular block, complete: Secondary | ICD-10-CM

## 2019-05-01 HISTORY — PX: PACEMAKER IMPLANT: EP1218

## 2019-05-01 LAB — COMPREHENSIVE METABOLIC PANEL
ALT: 33 U/L (ref 0–44)
AST: 22 U/L (ref 15–41)
Albumin: 3.9 g/dL (ref 3.5–5.0)
Alkaline Phosphatase: 71 U/L (ref 38–126)
Anion gap: 15 (ref 5–15)
BUN: 12 mg/dL (ref 8–23)
CO2: 23 mmol/L (ref 22–32)
Calcium: 9.5 mg/dL (ref 8.9–10.3)
Chloride: 97 mmol/L — ABNORMAL LOW (ref 98–111)
Creatinine, Ser: 0.75 mg/dL (ref 0.61–1.24)
GFR calc Af Amer: >60 mL/min (ref >60)
GFR calc non Af Amer: >60 mL/min (ref >60)
Glucose, Bld: 162 mg/dL — ABNORMAL HIGH (ref 70–99)
Potassium: 3.9 mmol/L (ref 3.5–5.1)
Sodium: 135 mmol/L (ref 135–145)
Total Bilirubin: 0.4 mg/dL (ref 0.3–1.2)
Total Protein: 6.3 g/dL — ABNORMAL LOW (ref 6.5–8.1)

## 2019-05-01 LAB — ECHOCARDIOGRAM COMPLETE
Height: 68 in
Weight: 4080 oz

## 2019-05-01 LAB — PROTIME-INR
INR: 1 (ref 0.8–1.2)
Prothrombin Time: 13.5 seconds (ref 11.4–15.2)

## 2019-05-01 LAB — GLUCOSE, CAPILLARY
Glucose-Capillary: 226 mg/dL — ABNORMAL HIGH (ref 70–99)
Glucose-Capillary: 159 mg/dL — ABNORMAL HIGH (ref 70–99)
Glucose-Capillary: 170 mg/dL — ABNORMAL HIGH (ref 70–99)
Glucose-Capillary: 142 mg/dL — ABNORMAL HIGH (ref 70–99)
Glucose-Capillary: 220 mg/dL — ABNORMAL HIGH (ref 70–99)

## 2019-05-01 LAB — CBC
HCT: 42.4 % (ref 39.0–52.0)
Hemoglobin: 14.1 g/dL (ref 13.0–17.0)
MCH: 29.1 pg (ref 26.0–34.0)
MCHC: 33.3 g/dL (ref 30.0–36.0)
MCV: 87.4 fL (ref 80.0–100.0)
Platelets: 238 10*3/uL (ref 150–400)
RBC: 4.85 MIL/uL (ref 4.22–5.81)
RDW: 12 % (ref 11.5–15.5)
WBC: 8.7 10*3/uL (ref 4.0–10.5)
nRBC: 0 % (ref 0.0–0.2)

## 2019-05-01 LAB — SURGICAL PCR SCREEN
MRSA, PCR: NEGATIVE
Staphylococcus aureus: POSITIVE — AB

## 2019-05-01 LAB — HEMOGLOBIN A1C
Hgb A1c MFr Bld: 9.1 % — ABNORMAL HIGH (ref 4.8–5.6)
Mean Plasma Glucose: 214.47 mg/dL

## 2019-05-01 LAB — TSH: TSH: 1.2 u[IU]/mL (ref 0.350–4.500)

## 2019-05-01 SURGERY — PACEMAKER IMPLANT
Anesthesia: LOCAL

## 2019-05-01 MED ORDER — MIDAZOLAM HCL 5 MG/5ML IJ SOLN
INTRAMUSCULAR | Status: AC
  Filled 2019-05-01: qty 5

## 2019-05-01 MED ORDER — SODIUM CHLORIDE 0.9 % IV SOLN
INTRAVENOUS | Status: AC
  Filled 2019-05-01: qty 2

## 2019-05-01 MED ORDER — HYDROCODONE-ACETAMINOPHEN 5-325 MG PO TABS
1.0000 | ORAL_TABLET | ORAL | Status: DC | PRN
  Administered 2019-05-02: 2 via ORAL
  Filled 2019-05-01: qty 2

## 2019-05-01 MED ORDER — CEFAZOLIN SODIUM-DEXTROSE 2-4 GM/100ML-% IV SOLN
2.0000 g | INTRAVENOUS | Status: AC
  Administered 2019-05-01: 2 g via INTRAVENOUS
  Filled 2019-05-01: qty 100

## 2019-05-01 MED ORDER — FENTANYL CITRATE (PF) 100 MCG/2ML IJ SOLN
INTRAMUSCULAR | Status: AC
  Filled 2019-05-01: qty 2

## 2019-05-01 MED ORDER — LIVING WELL WITH DIABETES BOOK
Freq: Once | Status: DC
  Filled 2019-05-01: qty 1

## 2019-05-01 MED ORDER — SODIUM CHLORIDE 0.9 % IV SOLN: 250.0000 mL | INTRAVENOUS | Status: DC | PRN

## 2019-05-01 MED ORDER — HEPARIN (PORCINE) IN NACL 1000-0.9 UT/500ML-% IV SOLN
INTRAVENOUS | Status: AC
  Filled 2019-05-01: qty 500

## 2019-05-01 MED ORDER — CEFAZOLIN SODIUM-DEXTROSE 2-4 GM/100ML-% IV SOLN
INTRAVENOUS | Status: AC
  Filled 2019-05-01: qty 100

## 2019-05-01 MED ORDER — SODIUM CHLORIDE 0.9 % IV SOLN
INTRAVENOUS | Status: DC
  Administered 2019-05-01: 09:00:00 via INTRAVENOUS

## 2019-05-01 MED ORDER — CEFAZOLIN SODIUM-DEXTROSE 1-4 GM/50ML-% IV SOLN
1.0000 g | Freq: Four times a day (QID) | INTRAVENOUS | Status: AC
  Administered 2019-05-01 – 2019-05-02 (×3): 1 g via INTRAVENOUS
  Filled 2019-05-01 (×4): qty 50

## 2019-05-01 MED ORDER — LIDOCAINE HCL (PF) 1 % IJ SOLN
INTRAMUSCULAR | Status: AC
  Filled 2019-05-01: qty 30

## 2019-05-01 MED ORDER — FENTANYL CITRATE (PF) 100 MCG/2ML IJ SOLN
INTRAMUSCULAR | Status: DC | PRN
  Administered 2019-05-01: 12.5 ug via INTRAVENOUS

## 2019-05-01 MED ORDER — HEPARIN (PORCINE) IN NACL 2-0.9 UNITS/ML
INTRAMUSCULAR | Status: AC | PRN
  Administered 2019-05-01: 500 mL

## 2019-05-01 MED ORDER — PERFLUTREN LIPID MICROSPHERE
1.0000 mL | INTRAVENOUS | Status: DC | PRN
  Administered 2019-05-01: 2 mL via INTRAVENOUS
  Filled 2019-05-01: qty 10

## 2019-05-01 MED ORDER — LIDOCAINE HCL (PF) 1 % IJ SOLN
INTRAMUSCULAR | Status: DC | PRN
  Administered 2019-05-01: 55 mL

## 2019-05-01 MED ORDER — SODIUM CHLORIDE 0.9 % IV SOLN
80.0000 mg | INTRAVENOUS | Status: AC
  Administered 2019-05-01: 80 mg
  Filled 2019-05-01 (×2): qty 2

## 2019-05-01 MED ORDER — SODIUM CHLORIDE 0.9% FLUSH: 3.0000 mL | INTRAVENOUS | Status: DC | PRN

## 2019-05-01 MED ORDER — IOHEXOL 350 MG/ML SOLN
INTRAVENOUS | Status: DC | PRN
  Administered 2019-05-01: 15 mL

## 2019-05-01 MED ORDER — LIDOCAINE HCL (PF) 1 % IJ SOLN
INTRAMUSCULAR | Status: AC
  Filled 2019-05-01: qty 60

## 2019-05-01 MED ORDER — ONDANSETRON HCL 4 MG/2ML IJ SOLN: 4.0000 mg | Freq: Four times a day (QID) | INTRAMUSCULAR | Status: DC | PRN

## 2019-05-01 MED ORDER — MIDAZOLAM HCL 5 MG/5ML IJ SOLN
INTRAMUSCULAR | Status: DC | PRN
  Administered 2019-05-01: 1 mg via INTRAVENOUS

## 2019-05-01 MED ORDER — SODIUM CHLORIDE 0.9% FLUSH: 3.0000 mL | Freq: Two times a day (BID) | INTRAVENOUS | Status: DC

## 2019-05-01 MED ORDER — ACETAMINOPHEN 325 MG PO TABS: 325.0000 mg | ORAL_TABLET | ORAL | Status: DC | PRN

## 2019-05-01 SURGICAL SUPPLY — 7 items
CABLE SURGICAL S-101-97-12 (CABLE) ×2 IMPLANT
LEAD TENDRIL MRI 52CM LPA1200M (Lead) ×2 IMPLANT
LEAD TENDRIL MRI 58CM LPA1200M (Lead) ×1 IMPLANT
PACEMAKER ASSURITY DR-RF (Pacemaker) ×2 IMPLANT
PAD PRO RADIOLUCENT 2001M-C (PAD) ×2 IMPLANT
SHEATH 8FR PRELUDE SNAP 13 (SHEATH) ×4 IMPLANT
TRAY PACEMAKER INSERTION (PACKS) ×2 IMPLANT

## 2019-05-01 NOTE — H&P (View-Only) (Signed)
ELECTROPHYSIOLOGY CONSULT NOTE    Patient ID: Lee [name] MRN: [id number], DOB/AGE: 12-23-1950 68 y.o.  Admit date: 04/30/2019 Date of Consult: 05/01/2019  Primary Physician: Susy Frizzle, MD Primary Cardiologist: Shelva Majestic, MD  Electrophysiologist: Dr. Rayann Heman   Referring Provider: Dr. Audie Box  Patient Profile: Lee [name] is a 68 y.o. male with a history of CAD s/p PCI, PAF s/p AF ablation not on [place], intermittent CHB with watchful waiting, HTN  who is being seen today for the evaluation of 2:1 AV block at the request of Dr. Audie Box.  HPI:  Lee [name] is a 68 y.o. male with medical history as above who is well known to Dr. Rayann Heman. He presented to the ED with 2 days of SOB and dizziness with exercise. He has been off AV nodal agents. Denies syncope or chest pain.  EKG on arrival showed intermittent 2:1 block.   He denies chest pain, PND, orthopnea, nausea, vomiting, dizziness, syncope, edema, weight gain, or early satiety leading up to this admission. He is feeling OK this am.   Past Medical History:  Diagnosis Date  . Allergic rhinitis    chronic sinusitis  . Arthritis   . CAD (coronary artery disease)    a. 2009 PCI/DES to mLAD; b. 05/2010 s/p DES RCA and LAD.; c. 05/2013 Cath: LAD mild ISR, LCX nl, RCA 40p, patent stents.  . Cataract    has had lasik surg  . COPD (chronic obstructive pulmonary disease) ([place])   . Diabetes mellitus type 2, controlled, without complications ([place])   . Diverticulosis   . DJD (degenerative joint disease)   . Esophagitis 1991   grade 1  . GERD (gastroesophageal reflux disease)    Hiatal hernia  . Hemorrhoids   . Hyperlipidemia   . Hypertensive heart disease   . Iron deficiency anemia   . Migraines   . Paroxysmal atrial fibrillation ([facility])    a. s/p afib ablation 10/22/08 (J. Jannelle Notaro).  . PVC's (premature ventricular contractions)   . Sleep apnea    Questionalble: RDI during the total sleep time 6h 28 mins was 3.55/hr  during REM sleep was at 5.71/hr. Supine AHI was 5.93/hr.  . Syncope 08/2018     Surgical History:  Past Surgical History:  Procedure Laterality Date  . CARDIAC CATHETERIZATION  05/2010   The proximal LAD was then predilated with 2.0 x 12 trek. this was then stented with a 2.5 x 16 promus Element drug-eluting stent at 14 atmosphere and prostdilated with 2.75 x 12 Amasa trek at 16 atmospheres(2.8 mm) resulting the reduction of 80% proximal LAD stenosis to 0% residual with excellent flow.  Marland Kitchen CARDIAC CATHETERIZATION  06/05/2010   Successful percutaneous coronary intervention to the right coronary artery with percutaneous transluminal coronary angioplasty/stenting and insertion 3.0 x 16 mm Promus DES post dilated to 3.35 mm with stenosis being reduced to 0%  . CARDIAC CATHETERIZATION  02/2008   Post dilatation was performed using a 2.75 x 9 [place] sprinter, 10 atmospheres for 40 seconds and then 9 atmosphere for 35 sec. This resulted in the 80% area of narrowing pre-intervention, now appearing to normal. There was no edvidence of the dissection or thrombus and there was TIMI III flow pre and post.  . CARDIAC CATHETERIZATION  02/2006  . CORONARY ANGIOPLASTY WITH STENT PLACEMENT     3 stents/ 4 caths  . EYE SURGERY    . KNEE ARTHROSCOPY     right  . LASIK    .  LEFT HEART CATHETERIZATION WITH CORONARY ANGIOGRAM N/A 06/29/2013   Procedure: LEFT HEART CATHETERIZATION WITH CORONARY ANGIOGRAM;  Surgeon: Leonie Man, MD;  Location: [facility] CATH LAB;  Service: Cardiovascular;  Laterality: N/A;  . NASAL SINUS SURGERY  2001  . NECK SURGERY     Cervical fusion  . RADIOFREQUENCY ABLATION  May 2010   atrial fibrillation  . ROTATOR CUFF REPAIR     left  . SHOULDER OPEN ROTATOR CUFF REPAIR     right  . SPINE SURGERY    . WRIST ARTHROCENTESIS     left     Medications Prior to Admission  Medication Sig Dispense Refill Last Dose  . amLODipine (NORVASC) 10 MG tablet Take 1 tablet (10 mg total) by mouth daily.  (Patient taking differently: Take 10 mg by mouth at bedtime. ) 90 tablet 3 04/29/2019 at pm  . aspirin EC 81 MG tablet Take 81 mg by mouth at bedtime.   04/29/2019 at pm  . cholecalciferol (VITAMIN D) 1000 UNITS tablet Take 1,000 Units by mouth at bedtime.    04/29/2019 at pm  . citalopram (CELEXA) 40 MG tablet TAKE 1 TABLET(40 MG) BY MOUTH DAILY (Patient taking differently: Take 40 mg by mouth daily with breakfast. ) 90 tablet 3 04/30/2019 at am  . Coenzyme Q10 (CO Q 10) 100 MG CAPS Take 100 mg by mouth daily with breakfast.    04/30/2019 at am  . Erenumab-aooe (AIMOVIG) 140 MG/ML SOAJ Inject 140 mg into the skin every 30 (thirty) days. On or about the 1st of each month   04/05/2019  . fexofenadine (ALLEGRA) 180 MG tablet Take 180 mg by mouth daily with breakfast.    04/30/2019 at am  . fluticasone (FLONASE) 50 MCG/ACT nasal spray INSTILL 2 SPRAYS IN THE  NOSE AT BEDTIME (Patient taking differently: Place 2 sprays into both nostrils at bedtime. ) 48 g 3 04/29/2019 at pm  . furosemide (LASIX) 40 MG tablet TAKE 1 TABLET BY MOUTH  DAILY (Patient taking differently: Take 40 mg by mouth daily with breakfast. ) 90 tablet 3 04/30/2019 at am  . Multiple Vitamin (MULTIVITAMIN WITH MINERALS) TABS tablet Take 1 tablet by mouth daily with breakfast.   04/30/2019 at am  . nitroGLYCERIN (NITROSTAT) 0.4 MG SL tablet PLACE ONE TABLET UNDER THE TONGUE EVERY 5 MINUTES AS NEEDED FOR CHEST PAIN (Patient taking differently: Place 0.4 mg under the tongue every 5 (five) minutes as needed for chest pain. ) 25 tablet 3 year ago  . oxyCODONE-acetaminophen (PERCOCET) 10-325 MG tablet Take 1 tablet by mouth every 8 (eight) hours as needed for pain. (Patient taking differently: Take 1 tablet by mouth daily as needed for pain (migraines). ) 21 tablet 0 04/28/2019  . pantoprazole (PROTONIX) 40 MG tablet TAKE 1 TABLET BY MOUTH  EVERY DAY (Patient taking differently: Take 40 mg by mouth at bedtime. ) 90 tablet 3 04/29/2019 at pm  .  Polyvinyl Alcohol-Povidone (REFRESH OP) Place 1 drop into both eyes at bedtime as needed (dry eyes).   04/29/2019 at pm  . potassium chloride (MICRO-K) 10 MEQ CR capsule TAKE 1 CAPSULE BY MOUTH  EVERY DAY (Patient taking differently: Take 10 mEq by mouth daily with breakfast. ) 90 capsule 3 04/30/2019 at am  . rosuvastatin (CRESTOR) 40 MG tablet TAKE 1 TABLET BY MOUTH  DAILY (Patient taking differently: Take 40 mg by mouth at bedtime. ) 90 tablet 1 04/29/2019 at pm  . Suvorexant (BELSOMRA) 10 MG TABS Take 1 tablet by  mouth at bedtime as needed (insomnia). (Patient taking differently: Take 10 mg by mouth at bedtime as needed (insomnia). ) 30 tablet 0 3 nights ago  . telmisartan (MICARDIS) 40 MG tablet TAKE 1 TABLET BY MOUTH  DAILY (Patient taking differently: Take 40 mg by mouth daily with breakfast. ) 90 tablet 3 04/30/2019 at am  . Ubrogepant (UBRELVY) 50 MG TABS Take 50 mg by mouth daily as needed (at onset of migraine headache).   month ago  . traMADol (ULTRAM) 50 MG tablet TAKE 1 TABLET(50 MG) BY MOUTH EVERY 8 HOURS AS NEEDED (Patient not taking: Reported on 04/30/2019) 30 tablet 0 Not Taking at Unknown time  . zolpidem (AMBIEN) 10 MG tablet TAKE 1 TABLET(10 MG) BY MOUTH AT BEDTIME AS NEEDED FOR SLEEP (Patient not taking: Reported on 04/30/2019) 30 tablet 2 Not Taking at Unknown time    Inpatient Medications:  . amLODipine  10 mg Oral Daily  . insulin aspart  0-15 Units Subcutaneous TID WC  . irbesartan  75 mg Oral Daily  . rosuvastatin  40 mg Oral Daily  . sodium chloride flush  3 mL Intravenous Q12H    Allergies:  Allergies  Allergen Reactions  . Ibuprofen Swelling    Whole body swelled  . Other Shortness Of Breath    BETA BLOCKERS  . Topamax [Topiramate] Other (See Comments)    Pt states it made his tongue feel "scalded" and he couldn't eat   . Toprol Xl [Metoprolol Succinate] Other (See Comments)    Personality change  . Metoprolol-Hctz Er Other (See Comments)     Indigestion,mouth raw    Social History   Socioeconomic History  . Marital status: Married    Spouse name: Not on file  . Number of children: 2  . Years of education: Not on file  . Highest education level: Not on file  Occupational History  . Occupation: retired  Scientific laboratory technician  . Financial resource strain: Not on file  . Food insecurity    Worry: Not on file    Inability: Not on file  . Transportation needs    Medical: Not on file    Non-medical: Not on file  Tobacco Use  . Smoking status: Former Smoker    Quit date: 06/01/1987    Years since quitting: 31.9  . Smokeless tobacco: Never Used  Substance and Sexual Activity  . Alcohol use: Yes    Alcohol/week: 0.0 standard drinks    Comment: once every 6-7 months  . Drug use: No  . Sexual activity: Not on file  Lifestyle  . Physical activity    Days per week: Not on file    Minutes per session: Not on file  . Stress: Not on file  Relationships  . Social Herbalist on phone: Not on file    Gets together: Not on file    Attends religious service: Not on file    Active member of club or organization: Not on file    Attends meetings of clubs or organizations: Not on file    Relationship status: Not on file  . Intimate partner violence    Fear of current or ex partner: Not on file    Emotionally abused: Not on file    Physically abused: Not on file    Forced sexual activity: Not on file  Other Topics Concern  . Not on file  Social History Narrative  . Not on file     Family History  Problem Relation Age of Onset  . Heart disease Father        and sister  . Diabetes Father   . Colon cancer Mother        mets from uterine  . Uterine cancer Mother   . Heart disease Sister      Review of Systems: All other systems reviewed and are otherwise negative except as noted above.  Physical Exam: Vitals:   04/30/19 2245 04/30/19 2300 04/30/19 2330 05/01/19 0443  BP: (!) 146/80 (!) 143/78 (!) 143/82 104/64   Pulse: 81 81 83 73  Resp: 19 20    Temp:   98 F (36.7 C) 97.8 F (36.6 C)  TempSrc:   Oral Oral  SpO2: 98% 97% 97% 95%  Weight:   116.9 kg 115.7 kg  Height:   5\' 8"  (1.727 m)     GEN- The patient is well appearing, alert and oriented x 3 today.   HEENT: normocephalic, atraumatic; sclera clear, conjunctiva pink; hearing intact; oropharynx clear; neck supple Lungs- Clear to ausculation bilaterally, normal work of breathing.  No wheezes, rales, rhonchi Heart- Regular rate and rhythm, no murmurs, rubs or gallops GI- soft, non-tender, non-distended, bowel sounds present Extremities- no clubbing, cyanosis, or edema; DP/PT/radial pulses 2+ bilaterally MS- no significant deformity or atrophy Skin- warm and dry, no rash or lesion Psych- euthymic mood, full affect Neuro- strength and sensation are intact  Labs:   Lab Results  Component Value Date   WBC 8.7 05/01/2019   HGB 14.1 05/01/2019   HCT 42.4 05/01/2019   MCV 87.4 05/01/2019   PLT 238 05/01/2019    Recent Labs  Lab 05/01/19 0358  NA 135  K 3.9  CL 97*  CO2 23  BUN 12  CREATININE 0.75  CALCIUM 9.5  PROT 6.3*  BILITOT 0.4  ALKPHOS 71  ALT 33  AST 22  GLUCOSE 162*      Radiology/Studies: Dg Chest Portable 1 View  Result Date: 04/30/2019 CLINICAL DATA:  Shortness of breath. EXAM: PORTABLE CHEST 1 VIEW COMPARISON:  Oct 10, 2018. FINDINGS: The heart size and mediastinal contours are within normal limits. Both lungs are clear. No pneumothorax or pleural effusion is noted. The visualized skeletal structures are unremarkable. IMPRESSION: No active disease. Electronically Signed   By: Marijo Conception M.D.   On: 04/30/2019 14:26    EKG:04/30/2019 shows 2:1 AV block at 45 bpm (personally reviewed)  TELEMETRY: 2:1 AV block with rates in 40-50s (personally reviewed)  Assessment/Plan: 1.  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  Risks and benefits including bleeding, infection, damage to heart or lungs, heart attack, stroke, or death have been discussed with patient at length and he agrees to proceed with pacemaker.  Will plan to proceed at next available time. Will plan for today.   2. Paroxysmal Afib Resolved s/p ablation.  Not on [place]  3. HTN Slightly elevated this am. Will adjust medications as tolerated post PPM   For questions or updates, please contact [place] Please consult [email or web address] for contact info under Cardiology/STEMI.  Signed, Shirley Friar, PA-C  05/01/2019 7:53 AM  I have seen, examined the patient, and reviewed the above assessment and plan.  Changes to above are made where necessary.  On exam, RRR.  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  No reversible causes are found. I would therefore recommend pacemaker implantation  at this time.  Risks, benefits, alternatives to pacemaker implantation were discussed in detail with the patient today. The patient understands that the risks include but are not limited to bleeding, infection, pneumothorax, perforation, tamponade, vascular damage, renal failure, MI, stroke, death, and lead dislodgement and wishes to proceed. We will therefore schedule the procedure at the next available time.  We will plan to proceed later this afternoon.  Co Sign: [name], MD 05/01/2019 11:25 AM

## 2019-05-01 NOTE — Progress Notes (Signed)
PCR: MRSA (-), Staphphyloccus aureu (+).  Barrington Ellison, PA aware.

## 2019-05-01 NOTE — Progress Notes (Addendum)
Inpatient Diabetes Program Recommendations  AACE/ADA: New Consensus Statement on Inpatient Glycemic Control (2015)  Target Ranges:  Prepandial:   less than 140 mg/dL      Peak postprandial:   less than 180 mg/dL (1-2 hours)      Critically ill patients:  140 - 180 mg/dL   Lab Results  Component Value Date   GLUCAP 170 (H) 05/01/2019   HGBA1C 9.1 (H) 05/01/2019    Review of Glycemic Control  Results for Lee [name], Lee [name] (MRN [id number]) as of 05/01/2019 14:11  Ref. Range 04/30/2019 23:26 05/01/2019 07:43 05/01/2019 11:30  Glucose-Capillary Latest Ref Range: 70 - 99 mg/dL 226 (H) 159 (H) Novolog 3units 170 (H) Novolog 3units     Diabetes history: DM2 Outpatient Diabetes medications: None Current orders for Inpatient glycemic control: Novolog 0-15u TID with meals   NOTE: Spoke with patient and spouse at bedside regarding elevated A1c of 9.1%.  This is up from 7.6% in May.  He is current with Dr. Dennard Schaumann for PCP.  Educated patient on A1c and the risks that are involved when elevated.  Explained his A1c of 9.1% is an average blood sugar of 212.  Provided patient with a educational sheets for meal planning and A1c ranges.  Ordered Living Well with DM book.  Patient is not interested in out patient education at this time.  He has been to cardiac rehab in the past and will consider if MD recommends.  He states he mainly drinks diet drinks and water.  He likes bread, pasta and sweets.  Educated patient on carbohydrates, which foods contain carbohydtrates and importance of limiting them.  Patient will likely benefit from dietary changes and adding Metformin to medication regime.   Encouraged patient to follow up with PCP after discharge.  Will continue to follow while inpatient.    Thank you, Geoffry Paradise, RN, BSN Diabetes Coordinator Inpatient Diabetes Program [phone] (team pager from 8a-5p)

## 2019-05-01 NOTE — Interval H&P Note (Signed)
History and Physical Interval Note:  05/01/2019 11:27 AM  Lee [name]  has presented today for surgery, with the diagnosis of Heart Block.  The various methods of treatment have been discussed with the patient and family. After consideration of risks, benefits and other options for treatment, the patient has consented to  Procedure(s): PACEMAKER IMPLANT (N/A) as a surgical intervention.  The patient's history has been reviewed, patient examined, no change in status, stable for surgery.  I have reviewed the patient's chart and labs.  Questions were answered to the patient's satisfaction.     Lee [name]

## 2019-05-01 NOTE — Progress Notes (Signed)
*   Echocardiogram 2D Echocardiogram with definity has been performed.  Darlina Sicilian M 05/01/2019, 8:26 AM

## 2019-05-01 NOTE — Consult Note (Addendum)
ELECTROPHYSIOLOGY CONSULT NOTE    Patient ID: Lee [name] MRN: [id number], DOB/AGE: 1950/08/17 68 y.o.  Admit date: 04/30/2019 Date of Consult: 05/01/2019  Primary Physician: Susy Frizzle, MD Primary Cardiologist: Shelva Majestic, MD  Electrophysiologist: Dr. Rayann Heman   Referring Provider: Dr. Audie Box  Patient Profile: Lee [name] is a 68 y.o. male with a history of CAD s/p PCI, PAF s/p AF ablation not on [place], intermittent CHB with watchful waiting, HTN  who is being seen today for the evaluation of 2:1 AV block at the request of Dr. Audie Box.  HPI:  Lee [name] is a 68 y.o. male with medical history as above who is well known to Dr. Rayann Heman. He presented to the ED with 2 days of SOB and dizziness with exercise. He has been off AV nodal agents. Denies syncope or chest pain.  EKG on arrival showed intermittent 2:1 block.   He denies chest pain, PND, orthopnea, nausea, vomiting, dizziness, syncope, edema, weight gain, or early satiety leading up to this admission. He is feeling OK this am.   Past Medical History:  Diagnosis Date  . Allergic rhinitis    chronic sinusitis  . Arthritis   . CAD (coronary artery disease)    a. 2009 PCI/DES to mLAD; b. 05/2010 s/p DES RCA and LAD.; c. 05/2013 Cath: LAD mild ISR, LCX nl, RCA 40p, patent stents.  . Cataract    has had lasik surg  . COPD (chronic obstructive pulmonary disease) ([place])   . Diabetes mellitus type 2, controlled, without complications ([place])   . Diverticulosis   . DJD (degenerative joint disease)   . Esophagitis 1991   grade 1  . GERD (gastroesophageal reflux disease)    Hiatal hernia  . Hemorrhoids   . Hyperlipidemia   . Hypertensive heart disease   . Iron deficiency anemia   . Migraines   . Paroxysmal atrial fibrillation ([facility])    a. s/p afib ablation 10/22/08 (J. Lalita Ebel).  . PVC's (premature ventricular contractions)   . Sleep apnea    Questionalble: RDI during the total sleep time 6h 28 mins was 3.55/hr  during REM sleep was at 5.71/hr. Supine AHI was 5.93/hr.  . Syncope 08/2018     Surgical History:  Past Surgical History:  Procedure Laterality Date  . CARDIAC CATHETERIZATION  05/2010   The proximal LAD was then predilated with 2.0 x 12 trek. this was then stented with a 2.5 x 16 promus Element drug-eluting stent at 14 atmosphere and prostdilated with 2.75 x 12 [place] trek at 16 atmospheres(2.8 mm) resulting the reduction of 80% proximal LAD stenosis to 0% residual with excellent flow.  Marland Kitchen CARDIAC CATHETERIZATION  06/05/2010   Successful percutaneous coronary intervention to the right coronary artery with percutaneous transluminal coronary angioplasty/stenting and insertion 3.0 x 16 mm Promus DES post dilated to 3.35 mm with stenosis being reduced to 0%  . CARDIAC CATHETERIZATION  02/2008   Post dilatation was performed using a 2.75 x 9 [place] sprinter, 10 atmospheres for 40 seconds and then 9 atmosphere for 35 sec. This resulted in the 80% area of narrowing pre-intervention, now appearing to normal. There was no edvidence of the dissection or thrombus and there was TIMI III flow pre and post.  . CARDIAC CATHETERIZATION  02/2006  . CORONARY ANGIOPLASTY WITH STENT PLACEMENT     3 stents/ 4 caths  . EYE SURGERY    . KNEE ARTHROSCOPY     right  . LASIK    .  LEFT HEART CATHETERIZATION WITH CORONARY ANGIOGRAM N/A 06/29/2013   Procedure: LEFT HEART CATHETERIZATION WITH CORONARY ANGIOGRAM;  Surgeon: Leonie Man, MD;  Location: [facility] C[facility] LAB;  Service: Cardiovascular;  Laterality: N/A;  . NASAL SINUS SURGERY  2001  . NECK SURGERY     Cervical fusion  . RADIOFREQUENCY ABLATION  May 2010   atrial fibrillation  . ROTATOR CUFF REPAIR     left  . SHOULDER OPEN ROTATOR CUFF REPAIR     right  . SPINE SURGERY    . WRIST ARTHROCENTESIS     left     Medications Prior to Admission  Medication Sig Dispense Refill Last Dose  . amLODipine (NORVASC) 10 MG tablet Take 1 tablet (10 mg total) by mouth daily.  (Patient taking differently: Take 10 mg by mouth at bedtime. ) 90 tablet 3 04/29/2019 at pm  . aspirin EC 81 MG tablet Take 81 mg by mouth at bedtime.   04/29/2019 at pm  . cholecalciferol (VITAMIN D) 1000 UNITS tablet Take 1,000 Units by mouth at bedtime.    04/29/2019 at pm  . citalopram (CELEXA) 40 MG tablet TAKE 1 TABLET(40 MG) BY MOUTH DAILY (Patient taking differently: Take 40 mg by mouth daily with breakfast. ) 90 tablet 3 04/30/2019 at am  . Coenzyme Q10 (CO Q 10) 100 MG CAPS Take 100 mg by mouth daily with breakfast.    04/30/2019 at am  . Erenumab-aooe (AIMOVIG) 140 MG/ML SOAJ Inject 140 mg into the skin every 30 (thirty) days. On or about the 1st of each month   04/05/2019  . fexofenadine (ALLEGRA) 180 MG tablet Take 180 mg by mouth daily with breakfast.    04/30/2019 at am  . fluticasone (FLONASE) 50 MCG/ACT nasal spray INSTILL 2 SPRAYS IN THE  NOSE AT BEDTIME (Patient taking differently: Place 2 sprays into both nostrils at bedtime. ) 48 g 3 04/29/2019 at pm  . furosemide (LASIX) 40 MG tablet TAKE 1 TABLET BY MOUTH  DAILY (Patient taking differently: Take 40 mg by mouth daily with breakfast. ) 90 tablet 3 04/30/2019 at am  . Multiple Vitamin (MULTIVITAMIN WITH MINERALS) TABS tablet Take 1 tablet by mouth daily with breakfast.   04/30/2019 at am  . nitroGLYCERIN (NITROSTAT) 0.4 MG SL tablet PLACE ONE TABLET UNDER THE TONGUE EVERY 5 MINUTES AS NEEDED FOR CHEST PAIN (Patient taking differently: Place 0.4 mg under the tongue every 5 (five) minutes as needed for chest pain. ) 25 tablet 3 year ago  . oxyCODONE-acetaminophen (PERCOCET) 10-325 MG tablet Take 1 tablet by mouth every 8 (eight) hours as needed for pain. (Patient taking differently: Take 1 tablet by mouth daily as needed for pain (migraines). ) 21 tablet 0 04/28/2019  . pantoprazole (PROTONIX) 40 MG tablet TAKE 1 TABLET BY MOUTH  EVERY DAY (Patient taking differently: Take 40 mg by mouth at bedtime. ) 90 tablet 3 04/29/2019 at pm  .  Polyvinyl Alcohol-Povidone (REFRESH OP) Place 1 drop into both eyes at bedtime as needed (dry eyes).   04/29/2019 at pm  . potassium chloride (MICRO-K) 10 MEQ CR capsule TAKE 1 CAPSULE BY MOUTH  EVERY DAY (Patient taking differently: Take 10 mEq by mouth daily with breakfast. ) 90 capsule 3 04/30/2019 at am  . rosuvastatin (CRESTOR) 40 MG tablet TAKE 1 TABLET BY MOUTH  DAILY (Patient taking differently: Take 40 mg by mouth at bedtime. ) 90 tablet 1 04/29/2019 at pm  . Suvorexant (BELSOMRA) 10 MG TABS Take 1 tablet by  mouth at bedtime as needed (insomnia). (Patient taking differently: Take 10 mg by mouth at bedtime as needed (insomnia). ) 30 tablet 0 3 nights ago  . telmisartan (MICARDIS) 40 MG tablet TAKE 1 TABLET BY MOUTH  DAILY (Patient taking differently: Take 40 mg by mouth daily with breakfast. ) 90 tablet 3 04/30/2019 at am  . Ubrogepant (UBRELVY) 50 MG TABS Take 50 mg by mouth daily as needed (at onset of migraine headache).   month ago  . traMADol (ULTRAM) 50 MG tablet TAKE 1 TABLET(50 MG) BY MOUTH EVERY 8 HOURS AS NEEDED (Patient not taking: Reported on 04/30/2019) 30 tablet 0 Not Taking at Unknown time  . zolpidem (AMBIEN) 10 MG tablet TAKE 1 TABLET(10 MG) BY MOUTH AT BEDTIME AS NEEDED FOR SLEEP (Patient not taking: Reported on 04/30/2019) 30 tablet 2 Not Taking at Unknown time    Inpatient Medications:  . amLODipine  10 mg Oral Daily  . insulin aspart  0-15 Units Subcutaneous TID WC  . irbesartan  75 mg Oral Daily  . rosuvastatin  40 mg Oral Daily  . sodium chloride flush  3 mL Intravenous Q12H    Allergies:  Allergies  Allergen Reactions  . Ibuprofen Swelling    Whole body swelled  . Other Shortness Of Breath    BETA BLOCKERS  . Topamax [Topiramate] Other (See Comments)    Pt states it made his tongue feel "scalded" and he couldn't eat   . Toprol Xl [Metoprolol Succinate] Other (See Comments)    Personality change  . Metoprolol-Hctz Er Other (See Comments)     Indigestion,mouth raw    Social History   Socioeconomic History  . Marital status: Married    Spouse name: Not on file  . Number of children: 2  . Years of education: Not on file  . Highest education level: Not on file  Occupational History  . Occupation: retired  Scientific laboratory technician  . Financial resource strain: Not on file  . Food insecurity    Worry: Not on file    Inability: Not on file  . Transportation needs    Medical: Not on file    Non-medical: Not on file  Tobacco Use  . Smoking status: Former Smoker    Quit date: 06/01/1987    Years since quitting: 31.9  . Smokeless tobacco: Never Used  Substance and Sexual Activity  . Alcohol use: Yes    Alcohol/week: 0.0 standard drinks    Comment: once every 6-7 months  . Drug use: No  . Sexual activity: Not on file  Lifestyle  . Physical activity    Days per week: Not on file    Minutes per session: Not on file  . Stress: Not on file  Relationships  . Social Herbalist on phone: Not on file    Gets together: Not on file    Attends religious service: Not on file    Active member of club or organization: Not on file    Attends meetings of clubs or organizations: Not on file    Relationship status: Not on file  . Intimate partner violence    Fear of current or ex partner: Not on file    Emotionally abused: Not on file    Physically abused: Not on file    Forced sexual activity: Not on file  Other Topics Concern  . Not on file  Social History Narrative  . Not on file     Family History  Problem Relation Age of Onset  . Heart disease Father        and sister  . Diabetes Father   . Colon cancer Mother        mets from uterine  . Uterine cancer Mother   . Heart disease Sister      Review of Systems: All other systems reviewed and are otherwise negative except as noted above.  Physical Exam: Vitals:   04/30/19 2245 04/30/19 2300 04/30/19 2330 05/01/19 0443  BP: (!) 146/80 (!) 143/78 (!) 143/82 104/64   Pulse: 81 81 83 73  Resp: 19 20    Temp:   98 F (36.7 C) 97.8 F (36.6 C)  TempSrc:   Oral Oral  SpO2: 98% 97% 97% 95%  Weight:   116.9 kg 115.7 kg  Height:   5\' 8"  (1.727 m)     GEN- The patient is well appearing, alert and oriented x 3 today.   HEENT: normocephalic, atraumatic; sclera clear, conjunctiva pink; hearing intact; oropharynx clear; neck supple Lungs- Clear to ausculation bilaterally, normal work of breathing.  No wheezes, rales, rhonchi Heart- Regular rate and rhythm, no murmurs, rubs or gallops GI- soft, non-tender, non-distended, bowel sounds present Extremities- no clubbing, cyanosis, or edema; DP/PT/radial pulses 2+ bilaterally MS- no significant deformity or atrophy Skin- warm and dry, no rash or lesion Psych- euthymic mood, full affect Neuro- strength and sensation are intact  Labs:   Lab Results  Component Value Date   WBC 8.7 05/01/2019   HGB 14.1 05/01/2019   HCT 42.4 05/01/2019   MCV 87.4 05/01/2019   PLT 238 05/01/2019    Recent Labs  Lab 05/01/19 0358  NA 135  K 3.9  CL 97*  CO2 23  BUN 12  CREATININE 0.75  CALCIUM 9.5  PROT 6.3*  BILITOT 0.4  ALKPHOS 71  ALT 33  AST 22  GLUCOSE 162*      Radiology/Studies: Dg Chest Portable 1 View  Result Date: 04/30/2019 CLINICAL DATA:  Shortness of breath. EXAM: PORTABLE CHEST 1 VIEW COMPARISON:  Oct 10, 2018. FINDINGS: The heart size and mediastinal contours are within normal limits. Both lungs are clear. No pneumothorax or pleural effusion is noted. The visualized skeletal structures are unremarkable. IMPRESSION: No active disease. Electronically Signed   By: Marijo Conception M.D.   On: 04/30/2019 14:26    EKG:04/30/2019 shows 2:1 AV block at 45 bpm (personally reviewed)  TELEMETRY: 2:1 AV block with rates in 40-50s (personally reviewed)  Assessment/Plan: 1.  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  Risks and benefits including bleeding, infection, damage to heart or lungs, heart attack, stroke, or death have been discussed with patient at length and he agrees to proceed with pacemaker.  Will plan to proceed at next available time. Will plan for today.   2. Paroxysmal Afib Resolved s/p ablation.  Not on [place]  3. HTN Slightly elevated this am. Will adjust medications as tolerated post PPM   For questions or updates, please contact [place] Please consult [email or web address] for contact info under Cardiology/STEMI.  Signed, Shirley Friar, PA-C  05/01/2019 7:53 AM  I have seen, examined the patient, and reviewed the above assessment and plan.  Changes to above are made where necessary.  On exam, RRR.  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  No reversible causes are found. I would therefore recommend pacemaker implantation  at this time.  Risks, benefits, alternatives to pacemaker implantation were discussed in detail with the patient today. The patient understands that the risks include but are not limited to bleeding, infection, pneumothorax, perforation, tamponade, vascular damage, renal failure, MI, stroke, death, and lead dislodgement and wishes to proceed. We will therefore schedule the procedure at the next available time.  We will plan to proceed later this afternoon.  Co Sign: [name], MD 05/01/2019 11:25 AM

## 2019-05-02 ENCOUNTER — Inpatient Hospital Stay (HOSPITAL_COMMUNITY): Payer: Medicare Other

## 2019-05-02 ENCOUNTER — Encounter (HOSPITAL_COMMUNITY): Payer: Self-pay | Admitting: Internal Medicine

## 2019-05-02 LAB — GLUCOSE, CAPILLARY: Glucose-Capillary: 166 mg/dL — ABNORMAL HIGH (ref 70–99)

## 2019-05-02 LAB — BASIC METABOLIC PANEL
Anion gap: 11 (ref 5–15)
BUN: 11 mg/dL (ref 8–23)
CO2: 25 mmol/L (ref 22–32)
Calcium: 9 mg/dL (ref 8.9–10.3)
Chloride: 98 mmol/L (ref 98–111)
Creatinine, Ser: 0.77 mg/dL (ref 0.61–1.24)
GFR calc Af Amer: >60 mL/min (ref >60)
GFR calc non Af Amer: >60 mL/min (ref >60)
Glucose, Bld: 177 mg/dL — ABNORMAL HIGH (ref 70–99)
Potassium: 4 mmol/L (ref 3.5–5.1)
Sodium: 134 mmol/L — ABNORMAL LOW (ref 135–145)

## 2019-05-02 MED ORDER — ACETAMINOPHEN 325 MG PO TABS: 325.0000 mg | ORAL_TABLET | ORAL | Status: AC | PRN

## 2019-05-02 NOTE — Progress Notes (Signed)
Pt stable, dc home via wheelchair with his wife.

## 2019-05-02 NOTE — Discharge Instructions (Signed)
After Your Pacemaker   You have a [employer] Jude Pacemaker   Do not lift your arm above shoulder height for 1 week after your procedure. After 7 days, you may progress as below.     Tuesday May 08, 2019  Wednesday May 09, 2019 Thursday May 10, 2019 Friday May 11, 2019    Do not lift, push, pull, or carry anything over 10 pounds with the affected arm until 6 weeks (Wednesday June 13, 2019) after your procedure.    Do not drive until your wound check, or until instructed by your healthcare provider that you are safe to do so.    Monitor your pacemaker site for redness, swelling, and drainage. Call the device clinic at [phone] if you experience these symptoms or fever/chills.   If your incision is sealed with Steri-strips or staples, you may shower 10 days after your procedure. Do not remove the steri-strips or let the shower hit directly on your site. You may wash around your site with soap and water. Avoid lotions, ointments, or perfumes over your incision until it is well-healed.   You may use a hot tub or a pool AFTER your wound check appointment if the incision is completely closed.   Your Pacemaker may be MRI compatible. We will discuss this at your first follow up/wound check. .    Remote monitoring is used to monitor your pacemaker from home. This monitoring is scheduled every 91 days by our office. It allows [place] to keep an eye on the functioning of your device to ensure it is working properly. You will routinely see your Electrophysiologist annually (more often if necessary).    Pacemaker Implantation, Care After This sheet gives you information about how to care for yourself after your procedure. Your health care provider may also give you more specific instructions. If you have problems or questions, contact your health care provider. What can I expect after the procedure? After the procedure, it is common to have:  Mild pain.  Slight  bruising.  Some swelling over the incision.  A slight bump over the skin where the device was placed. Sometimes, it is possible to feel the device under the skin. This is normal.  You should received your Pacemaker ID card within 4-8 weeks. Follow these instructions at home: Medicines  Take over-the-counter and prescription medicines only as told by your health care provider.  If you were prescribed an antibiotic medicine, take it as told by your health care provider. Do not stop taking the antibiotic even if you start to feel better. Wound care     Do not remove the bandage on your chest until directed to do so by your health care provider.  After your bandage is removed, you may see pieces of tape called skin adhesive strips over the area where the cut was made (incision site). Let them fall off on their own.  Check the incision site every day to make sure it is not infected, bleeding, or starting to pull apart.  Do not use lotions or ointments near the incision site unless directed to do so.  Keep the incision area clean and dry for 7 days after the procedure or as directed by your health care provider. It takes several weeks for the incision site to completely heal.  Do not take baths, swim, or use a hot tub for 7-10 days or as otherwise directed by your health care provider. Activity  Do not drive or use heavy machinery while taking  prescription pain medicine.  Do not drive for 24 hours if you were given a medicine to help you relax (sedative).  Check with your health care provider before you start to drive or play sports.  Avoid sudden jerking, pulling, or chopping movements that pull your upper arm far away from your body. Avoid these movements for at least 6 weeks or as long as told by your health care provider.  Do not lift your upper arm above your shoulders for at least 6 weeks or as long as told by your health care provider. This means no tennis, golf, or  swimming.  You may go back to work when your health care provider says it is okay. Pacemaker care  You may be shown how to transfer data from your pacemaker through the phone to your health care provider.  Always let all health care providers know about your pacemaker before you have any medical procedures or tests.  Wear a medical ID bracelet or necklace stating that you have a pacemaker. Carry a pacemaker ID card with you at all times.  Your pacemaker battery will last for 5-15 years. Routine checks by your health care provider will let the health care provider know when the battery is starting to run down. The pacemaker will need to be replaced when the battery starts to run down.  Do not use amateur Chief of Staff. Other electrical devices are safe to use, including power tools, lawn mowers, and speakers. If you are unsure of whether something is safe to use, ask your health care provider.  When using your cell phone, hold it to the ear opposite the pacemaker. Do not leave your cell phone in a pocket over the pacemaker.  Avoid places or objects that have a strong electric or magnetic field, including: ? Airport Herbalist. When at the airport, let officials know that you have a pacemaker. ? Power plants. ? Large electrical generators. ? Radiofrequency transmission towers, such as cell phone and radio towers. General instructions  Weigh yourself every day. If you suddenly gain weight, fluid may be building up in your body.  Keep all follow-up visits as told by your health care provider. This is important. Contact a health care provider if:  You gain weight suddenly.  Your legs or feet swell.  It feels like your heart is fluttering or skipping beats (heart palpitations).  You have chills or a fever.  You have more redness, swelling, or pain around your incisions.  You have more fluid or blood coming from your incisions.  Your incisions feel  warm to the touch.  You have pus or a bad smell coming from your incisions. Get help right away if:  You have chest pain.  You have trouble breathing or are short of breath.  You become extremely tired.  You are light-headed or you faint. This information is not intended to replace advice given to you by your health care provider. Make sure you discuss any questions you have with your health care provider.

## 2019-05-02 NOTE — Discharge Summary (Addendum)
ELECTROPHYSIOLOGY PROCEDURE DISCHARGE SUMMARY    Patient ID: TRI NOWLING,  MRN: [id number], DOB/AGE: [birth year] 68 y.o.  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 Ibuprofen Swelling    Whole body swelled  . Other Shortness Of Breath    BETA BLOCKERS  . Topamax [Topiramate] Other (See Comments)    Pt states it made his tongue feel "scalded" and he couldn't eat   . Toprol Xl [Metoprolol Succinate] Other (See Comments)    Personality change  . Metoprolol-Hctz Er Other (See Comments)    Indigestion,mouth raw     Procedures This Admission:  1.  Implantation of a St. Jude dual chamber PPM on 05/01/2019 by Dr. Rayann Heman.  The patient received a St. Jude model number [id number] PPM with model number [id number] right atrial lead and [id number] right ventricular lead. There were no immediate post procedure complications. 2.  CXR on 05/02/19 demonstrated no pneumothorax status post device implantation.   Brief HPI: Lee [name] is a 68 y.o. male presented to [facility] with 2 days of SOB and dizziness and was found to have intermittent, symptomatic 2:1 AV block. Electrophysiology team asked to see for consideration of PPM implantation.  Past medical history includes above.  The patient has had symptomatic bradycardia without reversible causes identified.  Risks, benefits, and alternatives to PPM implantation were reviewed with the patient who wished to proceed.   Hospital Course:  The patient was admitted and underwent implantation of a St. Jude dual chamber PPM with details as outlined above.  He was monitored  on telemetry overnight which demonstrated NSR, V pacing, and several episodes of Pacemaker Mediated Tachycardia (PMT). Device was reprogrammed to prevent this.  Left chest was without hematoma or ecchymosis.  The device was interrogated and found to be functioning normally.  CXR was obtained and demonstrated no pneumothorax status post device implantation.  Wound care, arm mobility, and restrictions were reviewed with the patient.  The patient was examined and considered stable for discharge to home.   Physical Exam: Vitals:   05/01/19 1740 05/01/19 1852 05/01/19 2121 05/02/19 0508  BP: (!) 145/86   118/84  Pulse: 76 73 79 72  Resp:      Temp:   98.1 F (36.7 C) 97.7 F (36.5 C)  TempSrc:   Oral Oral  SpO2: 93% 93% 97% 94%  Weight:    119.8 kg  Height:        GEN- The patient is well appearing, alert and oriented x 3 today.   HEENT: normocephalic, atraumatic; sclera clear, conjunctiva pink; hearing intact; oropharynx clear; neck supple, no JVP Lymph- no cervical lymphadenopathy Lungs- Clear to ausculation bilaterally, normal work of breathing.  No wheezes, rales, rhonchi Heart- Regular rate and rhythm, no murmurs, rubs or gallops, PMI not laterally displaced GI- soft, non-tender, non-distended, bowel sounds present, no hepatosplenomegaly Extremities- no clubbing, cyanosis, or edema; DP/PT/radial pulses 2+ bilaterally MS- no significant deformity or atrophy Skin- warm and dry, no rash or lesion, left chest without hematoma/ecchymosis Psych- euthymic mood, full affect Neuro- strength and sensation are intact   Labs:   Lab Results  Component Value Date   WBC 8.7 05/01/2019  HGB 14.1 05/01/2019   HCT 42.4 05/01/2019   MCV 87.4 05/01/2019   PLT 238 05/01/2019    Recent Labs  Lab 05/01/19 0358 05/02/19 0341  NA 135 134*  K 3.9 4.0  CL 97* 98  CO2 23 25  BUN 12 11  CREATININE 0.75 0.77  CALCIUM 9.5 9.0  PROT 6.3*  --   BILITOT 0.4  --   ALKPHOS 71  --   ALT 33  --    AST 22  --   GLUCOSE 162* 177*    Discharge Medications:  Allergies as of 05/02/2019      Reactions   Ibuprofen Swelling   Whole body swelled   Other Shortness Of Breath   BETA BLOCKERS   Topamax [topiramate] Other (See Comments)   Pt states it made his tongue feel "scalded" and he couldn't eat    Toprol Xl [metoprolol Succinate] Other (See Comments)   Personality change   Metoprolol-hctz Er Other (See Comments)   Indigestion,mouth raw      Medication List    STOP taking these medications   traMADol 50 MG tablet Commonly known as: ULTRAM   zolpidem 10 MG tablet Commonly known as: AMBIEN     TAKE these medications   acetaminophen 325 MG tablet Commonly known as: TYLENOL Take 1-2 tablets (325-650 mg total) by mouth every 4 (four) hours as needed for mild pain.   Aimovig 140 MG/ML Soaj Generic drug: Erenumab-aooe Inject 140 mg into the skin every 30 (thirty) days. On or about the 1st of each month   amLODipine 10 MG tablet Commonly known as: NORVASC Take 1 tablet (10 mg total) by mouth daily. What changed: when to take this   aspirin EC 81 MG tablet Take 81 mg by mouth at bedtime.   Belsomra 10 MG Tabs Generic drug: Suvorexant Take 1 tablet by mouth at bedtime as needed (insomnia). What changed: how much to take   cholecalciferol 1000 units tablet Commonly known as: VITAMIN D Take 1,000 Units by mouth at bedtime.   citalopram 40 MG tablet Commonly known as: CELEXA TAKE 1 TABLET(40 MG) BY MOUTH DAILY What changed: See the new instructions.   Co Q 10 100 MG Caps Take 100 mg by mouth daily with breakfast.   fexofenadine 180 MG tablet Commonly known as: ALLEGRA Take 180 mg by mouth daily with breakfast.   fluticasone 50 MCG/ACT nasal spray Commonly known as: FLONASE INSTILL 2 SPRAYS IN THE  NOSE AT BEDTIME What changed: See the new instructions.   furosemide 40 MG tablet Commonly known as: LASIX TAKE 1 TABLET BY MOUTH  DAILY What changed: when to  take this   multivitamin with minerals Tabs tablet Take 1 tablet by mouth daily with breakfast.   nitroGLYCERIN 0.4 MG SL tablet Commonly known as: NITROSTAT PLACE ONE TABLET UNDER THE TONGUE EVERY 5 MINUTES AS NEEDED FOR CHEST PAIN What changed:   how much to take  how to take this  when to take this  reasons to take this  additional instructions   oxyCODONE-acetaminophen 10-325 MG tablet Commonly known as: PERCOCET Take 1 tablet by mouth every 8 (eight) hours as needed for pain. What changed:   when to take this  reasons to take this   pantoprazole 40 MG tablet Commonly known as: PROTONIX TAKE 1 TABLET BY MOUTH  EVERY DAY What changed: when to take this   potassium chloride 10 MEQ CR capsule Commonly known as: MICRO-K TAKE 1 CAPSULE BY MOUTH  EVERY DAY What changed:   how much to take  when to take this   REFRESH OP Place 1 drop into both eyes at bedtime as needed (dry eyes).   rosuvastatin 40 MG tablet Commonly known as: CRESTOR TAKE 1 TABLET BY MOUTH  DAILY What changed: when to take this   telmisartan 40 MG tablet Commonly known as: MICARDIS TAKE 1 TABLET BY MOUTH  DAILY What changed: when to take this   Ubrelvy 50 MG Tabs Generic drug: Ubrogepant Take 50 mg by mouth daily as needed (at onset of migraine headache).       Disposition:   Follow-up Information    [name], MD Follow up on 08/06/2019.   Specialty: Cardiology Why: at 3 pm for 3 month post pacemaker check Contact information: [address] [place] [address] [place] Follow up on 05/15/2019.   Why: at 0930 for post pacemaker wound check Contact information: [place] [phone]          Duration of Discharge Encounter: Greater than 30 minutes including physician time.  Signed, Shirley Friar, PA-C  05/02/2019  9:01 AM    I have seen, examined the patient, and reviewed the above assessment and plan.  Changes to above are made where necessary.  On exam, RRR.  Device interrogation reviewed and normal.  CXR reveals stable leads, no ptx.  Co Sign: [name], MD 05/02/2019 6:12 PM

## 2019-05-07 ENCOUNTER — Other Ambulatory Visit: Payer: Self-pay

## 2019-05-08 ENCOUNTER — Ambulatory Visit (INDEPENDENT_AMBULATORY_CARE_PROVIDER_SITE_OTHER): Payer: Medicare Other | Admitting: Family Medicine

## 2019-05-08 ENCOUNTER — Encounter: Payer: Self-pay | Admitting: Family Medicine

## 2019-05-08 VITALS — BP 138/80 | HR 88 | Temp 96.7°F | Resp 18 | Ht 68.0 in | Wt 263.0 lb

## 2019-05-08 DIAGNOSIS — E118 Type 2 diabetes mellitus with unspecified complications: Secondary | ICD-10-CM | POA: Diagnosis not present

## 2019-05-08 MED ORDER — METFORMIN HCL 500 MG PO TABS: 1000.0000 mg | ORAL_TABLET | Freq: Two times a day (BID) | ORAL | 3 refills | Status: DC

## 2019-05-08 NOTE — Progress Notes (Signed)
Subjective:    Patient ID: Lee [name], male    DOB: [birth year], 68 y.o.   MRN: [id number]  HPI When I last saw the patient, he was admitted to the hospital with symptomatic bradycardia and had a pacemaker placed.  During the work-up for this his hemoglobin A1c was found to be elevated at 9.1.  I had seen the patient earlier in the year in May and his hemoglobin A1c was elevated at 7.6.  We attempted to change lifestyle and diet however he has been unsuccessful in managing his diabetes without medication and now is clearly out of control.  He is here today to discuss treatment options. Past Medical History:  Diagnosis Date  . Allergic rhinitis    chronic sinusitis  . Arthritis   . CAD (coronary artery disease)    a. 2009 PCI/DES to mLAD; b. 05/2010 s/p DES RCA and LAD.; c. 05/2013 Cath: LAD mild ISR, LCX nl, RCA 40p, patent stents.  . Cataract    has had lasik surg  . COPD (chronic obstructive pulmonary disease) ([place])   . Diabetes mellitus type 2, controlled, without complications ([place])   . Diverticulosis   . DJD (degenerative joint disease)   . Esophagitis 1991   grade 1  . GERD (gastroesophageal reflux disease)    Hiatal hernia  . Hemorrhoids   . Hyperlipidemia   . Hypertensive heart disease   . Iron deficiency anemia   . Migraines   . Paroxysmal atrial fibrillation (HCC)    a. s/p afib ablation 10/22/08 ([facility]).  . PVC's (premature ventricular contractions)   . Sleep apnea    Questionalble: RDI during the total sleep time 6h 28 mins was 3.55/hr during REM sleep was at 5.71/hr. Supine AHI was 5.93/hr.  . Syncope 08/2018   Past Surgical History:  Procedure Laterality Date  . CARDIAC CATHETERIZATION  05/2010   The proximal LAD was then predilated with 2.0 x 12 trek. this was then stented with a 2.5 x 16 promus Element drug-eluting stent at 14 atmosphere and prostdilated with 2.75 x 12 [place] trek at 16 atmospheres(2.8 mm) resulting the reduction of 80% proximal LAD stenosis  to 0% residual with excellent flow.  Marland Kitchen CARDIAC CATHETERIZATION  06/05/2010   Successful percutaneous coronary intervention to the right coronary artery with percutaneous transluminal coronary angioplasty/stenting and insertion 3.0 x 16 mm Promus DES post dilated to 3.35 mm with stenosis being reduced to 0%  . CARDIAC CATHETERIZATION  02/2008   Post dilatation was performed using a 2.75 x 9 [place] sprinter, 10 atmospheres for 40 seconds and then 9 atmosphere for 35 sec. This resulted in the 80% area of narrowing pre-intervention, now appearing to normal. There was no edvidence of the dissection or thrombus and there was TIMI III flow pre and post.  . CARDIAC CATHETERIZATION  02/2006  . CORONARY ANGIOPLASTY WITH STENT PLACEMENT     3 stents/ 4 caths  . EYE SURGERY    . KNEE ARTHROSCOPY     right  . LASIK    . LEFT HEART CATHETERIZATION WITH CORONARY ANGIOGRAM N/A 06/29/2013   Procedure: LEFT HEART CATHETERIZATION WITH CORONARY ANGIOGRAM;  Surgeon: Leonie Man, MD;  Location: [facility] CATH LAB;  Service: Cardiovascular;  Laterality: N/A;  . NASAL SINUS SURGERY  2001  . NECK SURGERY     Cervical fusion  . PACEMAKER IMPLANT N/A 05/01/2019   Procedure: PACEMAKER IMPLANT;  Surgeon: [name], MD;  Location: [place] CV LAB;  Service: Cardiovascular;  Laterality: N/A;  . RADIOFREQUENCY ABLATION  May 2010   atrial fibrillation  . ROTATOR CUFF REPAIR     left  . SHOULDER OPEN ROTATOR CUFF REPAIR     right  . SPINE SURGERY    . WRIST ARTHROCENTESIS     left   Current Outpatient Medications on File Prior to Visit  Medication Sig Dispense Refill  . acetaminophen (TYLENOL) 325 MG tablet Take 1-2 tablets (325-650 mg total) by mouth every 4 (four) hours as needed for mild pain.    Marland Kitchen amLODipine (NORVASC) 10 MG tablet Take 1 tablet (10 mg total) by mouth daily. 90 tablet 3  . aspirin EC 81 MG tablet Take 81 mg by mouth at bedtime.    . cholecalciferol (VITAMIN D) 1000 UNITS tablet Take 1,000 Units by  mouth at bedtime.     . citalopram (CELEXA) 40 MG tablet TAKE 1 TABLET(40 MG) BY MOUTH DAILY 90 tablet 3  . Coenzyme Q10 (CO Q 10) 100 MG CAPS Take 100 mg by mouth daily with breakfast.     . Erenumab-aooe (AIMOVIG) 140 MG/ML SOAJ Inject 140 mg into the skin every 30 (thirty) days. On or about the 1st of each month    . fexofenadine (ALLEGRA) 180 MG tablet Take 180 mg by mouth daily with breakfast.     . fluticasone (FLONASE) 50 MCG/ACT nasal spray INSTILL 2 SPRAYS IN THE  NOSE AT BEDTIME 48 g 3  . furosemide (LASIX) 40 MG tablet TAKE 1 TABLET BY MOUTH  DAILY 90 tablet 3  . Multiple Vitamin (MULTIVITAMIN WITH MINERALS) TABS tablet Take 1 tablet by mouth daily with breakfast.    . nitroGLYCERIN (NITROSTAT) 0.4 MG SL tablet PLACE ONE TABLET UNDER THE TONGUE EVERY 5 MINUTES AS NEEDED FOR CHEST PAIN (Patient taking differently: Place 0.4 mg under the tongue every 5 (five) minutes as needed for chest pain. ) 25 tablet 3  . oxyCODONE-acetaminophen (PERCOCET) 10-325 MG tablet Take 1 tablet by mouth every 8 (eight) hours as needed for pain. 21 tablet 0  . pantoprazole (PROTONIX) 40 MG tablet TAKE 1 TABLET BY MOUTH  EVERY DAY 90 tablet 3  . Polyvinyl Alcohol-Povidone (REFRESH OP) Place 1 drop into both eyes at bedtime as needed (dry eyes).    . potassium chloride (MICRO-K) 10 MEQ CR capsule TAKE 1 CAPSULE BY MOUTH  EVERY DAY 90 capsule 3  . rosuvastatin (CRESTOR) 40 MG tablet TAKE 1 TABLET BY MOUTH  DAILY 90 tablet 1  . Suvorexant (BELSOMRA) 10 MG TABS Take 1 tablet by mouth at bedtime as needed (insomnia). 30 tablet 0  . telmisartan (MICARDIS) 40 MG tablet TAKE 1 TABLET BY MOUTH  DAILY 90 tablet 3  . Ubrogepant (UBRELVY) 50 MG TABS Take 50 mg by mouth daily as needed (at onset of migraine headache).     No current facility-administered medications on file prior to visit.    Allergies  Allergen Reactions  . Ibuprofen Swelling    Whole body swelled  . Other Shortness Of Breath    BETA BLOCKERS  .  Topamax [Topiramate] Other (See Comments)    Pt states it made his tongue feel "scalded" and he couldn't eat   . Toprol Xl [Metoprolol Succinate] Other (See Comments)    Personality change  . Metoprolol-Hctz Er Other (See Comments)    Indigestion,mouth raw   Social History   Socioeconomic History  . Marital status: Married    Spouse name: Not on file  . Number of children:  2  . Years of education: Not on file  . Highest education level: Not on file  Occupational History  . Occupation: retired  Scientific laboratory technician  . Financial resource strain: Not on file  . Food insecurity    Worry: Not on file    Inability: Not on file  . Transportation needs    Medical: Not on file    Non-medical: Not on file  Tobacco Use  . Smoking status: Former Smoker    Quit date: 06/01/1987    Years since quitting: 31.9  . Smokeless tobacco: Never Used  Substance and Sexual Activity  . Alcohol use: Yes    Alcohol/week: 0.0 standard drinks    Comment: once every 6-7 months  . Drug use: No  . Sexual activity: Not on file  Lifestyle  . Physical activity    Days per week: Not on file    Minutes per session: Not on file  . Stress: Not on file  Relationships  . Social Herbalist on phone: Not on file    Gets together: Not on file    Attends religious service: Not on file    Active member of club or organization: Not on file    Attends meetings of clubs or organizations: Not on file    Relationship status: Not on file  . Intimate partner violence    Fear of current or ex partner: Not on file    Emotionally abused: Not on file    Physically abused: Not on file    Forced sexual activity: Not on file  Other Topics Concern  . Not on file  Social History Narrative  . Not on file      Review of Systems  All other systems reviewed and are negative.      Objective:   Physical Exam Vitals signs reviewed.  Constitutional:      General: He is not in acute distress.    Appearance: He is  not ill-appearing, toxic-appearing or diaphoretic.  Cardiovascular:     Rate and Rhythm: Normal rate.     Heart sounds: Normal heart sounds.  Pulmonary:     Effort: Pulmonary effort is normal. No tachypnea or respiratory distress.     Breath sounds: No stridor.  Musculoskeletal:     Right lower leg: No edema.     Left lower leg: No edema.  Neurological:     Mental Status: He is alert.           Assessment & Plan:  Type 2 diabetes mellitus with complication, without long-term current use of insulin (HCC)  Begin Metformin 500 mg p.o. daily and gradually uptitrate over the next 2 to 3 weeks 2000 mg twice daily.  Limit carbohydrates to 45 g of carbs per meal.  Encourage 30 minutes a day 5 days a week of aerobic exercise and try to lose 15 to 20 pounds.  I would recheck hemoglobin A1c in 3 months and if still elevated at that point I would likely add Jardiance.  Patient will check to see if his insurance will cover Jardiance initially because I do believe this medication would be better given his underlying cardiovascular issues however I am certain that he will be able to afford Metformin.  If the Vania Rea is reasonably priced we will discontinue Metformin and replace with Jardiance 25 mg a day.

## 2019-05-08 NOTE — Addendum Note (Signed)
Addended by: Shary Decamp B on: 05/08/2019 02:48 PM   Modules accepted: Orders

## 2019-05-10 ENCOUNTER — Telehealth: Payer: Self-pay | Admitting: Family Medicine

## 2019-05-10 MED ORDER — EMPAGLIFLOZIN 25 MG PO TABS: 25.0000 mg | ORAL_TABLET | Freq: Every day | ORAL | 5 refills | Status: DC

## 2019-05-10 MED ORDER — ACCU-CHEK FASTCLIX LANCET KIT: PACK | 1 refills | Status: DC

## 2019-05-10 MED ORDER — ACCU-CHEK FASTCLIX LANCETS MISC: 3 refills | Status: DC

## 2019-05-10 MED ORDER — ACCU-CHEK AVIVA PLUS VI STRP: ORAL_STRIP | 12 refills | Status: DC

## 2019-05-10 MED ORDER — ACCU-CHEK AVIVA PLUS W/DEVICE KIT: PACK | 1 refills | Status: AC

## 2019-05-10 NOTE — Telephone Encounter (Signed)
Pt called and states that you had discussed Jardiance over Metformin. His ins will cover jardiance 25mg  & 10mg  and he would like to switch. Please advise.

## 2019-05-10 NOTE — Telephone Encounter (Signed)
Stop metformin and switch to Jardiance 25 mg a day.

## 2019-05-10 NOTE — Telephone Encounter (Signed)
Medication called/sent to requested pharmacy and pt aware 

## 2019-05-15 ENCOUNTER — Ambulatory Visit (INDEPENDENT_AMBULATORY_CARE_PROVIDER_SITE_OTHER): Payer: Medicare Other | Admitting: *Deleted

## 2019-05-15 ENCOUNTER — Telehealth: Payer: Self-pay | Admitting: *Deleted

## 2019-05-15 ENCOUNTER — Other Ambulatory Visit: Payer: Self-pay

## 2019-05-15 DIAGNOSIS — Z950 Presence of cardiac pacemaker: Secondary | ICD-10-CM

## 2019-05-15 DIAGNOSIS — I442 Atrioventricular block, complete: Secondary | ICD-10-CM

## 2019-05-15 NOTE — Telephone Encounter (Signed)
LMOVM requesting call back to DC. Need to reschedule appointment to different day or virtual visit if patient is unable to walk stairs due to elevators out of service.

## 2019-05-18 LAB — CUP PACEART INCLINIC DEVICE CHECK
Battery Remaining Longevity: 129 mo
Battery Voltage: 3.04 V
Brady Statistic RA Percent Paced: 1.1 %
Brady Statistic RV Percent Paced: 12 %
Date Time Interrogation Session: 20201215104900
Implantable Lead Implant Date: 20201201
Implantable Lead Implant Date: 20201201
Implantable Lead Location: 753859
Implantable Lead Location: 753860
Implantable Pulse Generator Implant Date: 20201201
Lead Channel Impedance Value: 525 Ohm
Lead Channel Impedance Value: 600 Ohm
Lead Channel Pacing Threshold Amplitude: 0.5 V
Lead Channel Pacing Threshold Amplitude: 1 V
Lead Channel Pacing Threshold Pulse Width: 0.5 ms
Lead Channel Pacing Threshold Pulse Width: 0.5 ms
Lead Channel Sensing Intrinsic Amplitude: 2.1 mV
Lead Channel Sensing Intrinsic Amplitude: 7.8 mV
Lead Channel Setting Pacing Amplitude: 3.5 V
Lead Channel Setting Pacing Amplitude: 3.5 V
Lead Channel Setting Pacing Pulse Width: 0.5 ms
Lead Channel Setting Sensing Sensitivity: 2 mV
Pulse Gen Model: 2272
Pulse Gen Serial Number: 9182765

## 2019-05-18 NOTE — Progress Notes (Signed)
Wound check appointment. Steri-strips removed. Wound without redness or edema. Incision edges approximated, wound well healed. Normal device function. Thresholds, sensing, and impedances consistent with implant measurements. Device programmed at 3.5V for extra safety margin until 3 month visit. Histogram distribution appropriate for patient and level of activity. No mode switches or high ventricular rates noted. Patient educated about wound care, arm mobility, lifting restrictions, and Merlin monitor. ROV with Dr. Rayann Heman on 08/06/19.

## 2019-05-21 ENCOUNTER — Encounter: Payer: Self-pay | Admitting: Family Medicine

## 2019-05-21 ENCOUNTER — Other Ambulatory Visit: Payer: Self-pay

## 2019-05-21 ENCOUNTER — Ambulatory Visit (INDEPENDENT_AMBULATORY_CARE_PROVIDER_SITE_OTHER): Payer: Medicare Other | Admitting: Family Medicine

## 2019-05-21 VITALS — BP 110/70 | HR 86 | Temp 96.8°F | Resp 18 | Ht 68.0 in | Wt 256.0 lb

## 2019-05-21 DIAGNOSIS — R0789 Other chest pain: Secondary | ICD-10-CM

## 2019-05-21 NOTE — Progress Notes (Signed)
Subjective:    Patient ID: Lee [name], male    DOB: [birth year], 68 y.o.   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  During the work-up for this his hemoglobin A1c was found to be elevated at 9.1.  I had seen the patient earlier in the year in May and his hemoglobin A1c was elevated at 7.6.  We attempted to change lifestyle and diet however he has been unsuccessful in managing his diabetes without medication and now is clearly out of control.  He is here today to discuss treatment options.  At that time, my plan was: Begin Metformin 500 mg p.o. daily and gradually uptitrate over the next 2 to 3 weeks 2000 mg twice daily.  Limit carbohydrates to 45 g of carbs per meal.  Encourage 30 minutes a day 5 days a week of aerobic exercise and try to lose 15 to 20 pounds.  I would recheck hemoglobin A1c in 3 months and if still elevated at that point I would likely add Jardiance.  Patient will check to see if his insurance will cover Jardiance initially because I do believe this medication would be better given his underlying cardiovascular issues however I am certain that he will be able to afford Metformin.  If the Vania Rea is reasonably priced we will discontinue Metformin and replace with Jardiance 25 mg a day.  05/21/19 Recently the patient picked up a heavy box.  Immediately thereafter he felt a pulling discomfort in the center of his chest slightly to the right of the sternum.  He was concerned that something may have damaged his pacemaker when he did that.  His EKG today shows a paced rhythm with 1 isolated PAC.  Patient denies any angina.  He denies any shortness of breath.  He denies any dyspnea on exertion.  His physical exam today is completely normal.  There is no abnormality in the skin overlying the pacemaker.  There is no redness or induration or warmth.  There is no tenderness to  palpation along the chest wall or along the sternum or at the costochondral junction.  Patient describes it as a vague pressure-like sensation in the area diagrammed  Past Medical History:  Diagnosis Date  . Allergic rhinitis    chronic sinusitis  . Arthritis   . CAD (coronary artery disease)    a. 2009 PCI/DES to mLAD; b. 05/2010 s/p DES RCA and LAD.; c. 05/2013 Cath: LAD mild ISR, LCX nl, RCA 40p, patent stents.  . Cataract    has had lasik surg  . COPD (chronic obstructive pulmonary disease) ([place])   . Diabetes mellitus type 2, controlled, without complications ([place])   . Diverticulosis   . DJD (degenerative joint disease)   . Esophagitis 1991   grade 1  . GERD (gastroesophageal reflux disease)    Hiatal hernia  . Hemorrhoids   . Hyperlipidemia   . Hypertensive heart disease   . Iron deficiency anemia   . Migraines   . Paroxysmal atrial fibrillation ([facility])    a. s/p afib ablation 10/22/08 ([facility]).  . PVC's (premature ventricular contractions)   . Sleep apnea    Questionalble: RDI during the total sleep time 6h 28 mins was 3.55/hr during REM sleep was at 5.71/hr. Supine AHI was 5.93/hr.  . Syncope 08/2018   Past Surgical History:  Procedure Laterality Date  . CARDIAC CATHETERIZATION  05/2010   The proximal LAD was then predilated with 2.0 x 12 trek. this was then stented with a 2.5 x 16 promus Element drug-eluting stent at 14 atmosphere and prostdilated with 2.75 x 12 Gilliam trek at 16 atmospheres(2.8 mm) resulting the reduction of 80% proximal LAD stenosis to 0% residual with excellent flow.  Marland Kitchen CARDIAC CATHETERIZATION  06/05/2010   Successful percutaneous coronary intervention to the right coronary artery with percutaneous transluminal coronary angioplasty/stenting and insertion 3.0 x 16 mm Promus DES post dilated to 3.35 mm with stenosis being reduced to 0%  . CARDIAC CATHETERIZATION  02/2008   Post dilatation was performed using a 2.75 x 9 [place] sprinter, 10 atmospheres for 40  seconds and then 9 atmosphere for 35 sec. This resulted in the 80% area of narrowing pre-intervention, now appearing to normal. There was no edvidence of the dissection or thrombus and there was TIMI III flow pre and post.  . CARDIAC CATHETERIZATION  02/2006  . CORONARY ANGIOPLASTY WITH STENT PLACEMENT     3 stents/ 4 caths  . EYE SURGERY    . KNEE ARTHROSCOPY     right  . LASIK    . LEFT HEART CATHETERIZATION WITH CORONARY ANGIOGRAM N/A 06/29/2013   Procedure: LEFT HEART CATHETERIZATION WITH CORONARY ANGIOGRAM;  Surgeon: Leonie Man, MD;  Location: [facility] CATH LAB;  Service: Cardiovascular;  Laterality: N/A;  . NASAL SINUS SURGERY  2001  . NECK SURGERY     Cervical fusion  . PACEMAKER IMPLANT N/A 05/01/2019   Procedure: PACEMAKER IMPLANT;  Surgeon: [name], MD;  Location: [place] [facility] CV LAB;  Service: Cardiovascular;  Laterality: N/A;  . RADIOFREQUENCY ABLATION  May 2010   atrial fibrillation  . ROTATOR CUFF REPAIR     left  . SHOULDER OPEN ROTATOR CUFF REPAIR     right  . SPINE SURGERY    . WRIST ARTHROCENTESIS     left   Current Outpatient Medications on File Prior to Visit  Medication Sig Dispense Refill  . Accu-Chek FastClix Lancets MISC Check BS bid E11.9 102 each 3  . acetaminophen (TYLENOL) 325 MG tablet Take 1-2 tablets (325-650 mg total) by mouth every 4 (four) hours as needed for mild pain.    Marland Kitchen amLODipine (NORVASC) 10 MG tablet Take 1 tablet (10 mg total) by mouth daily. 90 tablet 3  . aspirin EC 81 MG tablet Take 81 mg by mouth at bedtime.    . Blood Glucose Monitoring Suppl (ACCU-CHEK AVIVA PLUS) w/Device KIT Check BS bid E11.9 1 kit 1  . cholecalciferol (VITAMIN D) 1000 UNITS tablet Take 1,000 Units by mouth at bedtime.     . citalopram (CELEXA) 40 MG tablet TAKE 1 TABLET(40 MG) BY MOUTH DAILY 90 tablet 3  . Coenzyme Q10 (CO Q 10) 100 MG CAPS Take 100 mg by mouth daily with breakfast.     . empagliflozin (JARDIANCE) 25 MG TABS tablet Take 25 mg by mouth daily  before breakfast. 30 tablet 5  . Erenumab-aooe (AIMOVIG) 140 MG/ML SOAJ Inject 140 mg into the skin every 30 (thirty) days. On or about the 1st of each month    . fexofenadine (ALLEGRA) 180 MG tablet Take 180 mg by mouth daily with breakfast.     . fluticasone (FLONASE) 50 MCG/ACT nasal spray INSTILL 2 SPRAYS IN THE  NOSE AT BEDTIME 48 g 3  . furosemide (LASIX) 40 MG tablet TAKE 1 TABLET BY MOUTH  DAILY 90 tablet 3  . glucose blood (ACCU-CHEK  AVIVA PLUS) test strip Check BS bid E11.9 100 each 12  . Lancets Misc. (ACCU-CHEK FASTCLIX LANCET) KIT Check BS bid E11.9 1 kit 1  . Multiple Vitamin (MULTIVITAMIN WITH MINERALS) TABS tablet Take 1 tablet by mouth daily with breakfast.    . nitroGLYCERIN (NITROSTAT) 0.4 MG SL tablet PLACE ONE TABLET UNDER THE TONGUE EVERY 5 MINUTES AS NEEDED FOR CHEST PAIN (Patient taking differently: Place 0.4 mg under the tongue every 5 (five) minutes as needed for chest pain. ) 25 tablet 3  . oxyCODONE-acetaminophen (PERCOCET) 10-325 MG tablet Take 1 tablet by mouth every 8 (eight) hours as needed for pain. 21 tablet 0  . pantoprazole (PROTONIX) 40 MG tablet TAKE 1 TABLET BY MOUTH  EVERY DAY 90 tablet 3  . Polyvinyl Alcohol-Povidone (REFRESH OP) Place 1 drop into both eyes at bedtime as needed (dry eyes).    . potassium chloride (MICRO-K) 10 MEQ CR capsule TAKE 1 CAPSULE BY MOUTH  EVERY DAY 90 capsule 3  . rosuvastatin (CRESTOR) 40 MG tablet TAKE 1 TABLET BY MOUTH  DAILY 90 tablet 1  . Suvorexant (BELSOMRA) 10 MG TABS Take 1 tablet by mouth at bedtime as needed (insomnia). 30 tablet 0  . telmisartan (MICARDIS) 40 MG tablet TAKE 1 TABLET BY MOUTH  DAILY 90 tablet 3  . Ubrogepant (UBRELVY) 50 MG TABS Take 50 mg by mouth daily as needed (at onset of migraine headache).     No current facility-administered medications on file prior to visit.   Allergies  Allergen Reactions  . Ibuprofen Swelling    Whole body swelled  . Other Shortness Of Breath    BETA BLOCKERS  .  Topamax [Topiramate] Other (See Comments)    Pt states it made his tongue feel "scalded" and he couldn't eat   . Toprol Xl [Metoprolol Succinate] Other (See Comments)    Personality change  . Metoprolol-Hctz Er Other (See Comments)    Indigestion,mouth raw   Social History   Socioeconomic History  . Marital status: Married    Spouse name: Not on file  . Number of children: 2  . Years of education: Not on file  . Highest education level: Not on file  Occupational History  . Occupation: retired  Tobacco Use  . Smoking status: Former Smoker    Quit date: 06/01/1987    Years since quitting: 31.9  . Smokeless tobacco: Never Used  Substance and Sexual Activity  . Alcohol use: Yes    Alcohol/week: 0.0 standard drinks    Comment: once every 6-7 months  . Drug use: No  . Sexual activity: Not on file  Other Topics Concern  . Not on file  Social History Narrative  . Not on file   Social Determinants of Health   Financial Resource Strain:   . Difficulty of Paying Living Expenses: Not on file  Food Insecurity:   . Worried About Charity fundraiser in the Last Year: Not on file  . Ran Out of Food in the Last Year: Not on file  Transportation Needs:   . Lack of Transportation (Medical): Not on file  . Lack of Transportation (Non-Medical): Not on file  Physical Activity:   . Days of Exercise per Week: Not on file  . Minutes of Exercise per Session: Not on file  Stress:   . Feeling of Stress : Not on file  Social Connections:   . Frequency of Communication with Friends and Family: Not on file  . Frequency of Social  Gatherings with Friends and Family: Not on file  . Attends Religious Services: Not on file  . Active Member of Clubs or Organizations: Not on file  . Attends Archivist Meetings: Not on file  . Marital Status: Not on file  Intimate Partner Violence:   . Fear of Current or Ex-Partner: Not on file  . Emotionally Abused: Not on file  . Physically Abused: Not  on file  . Sexually Abused: Not on file      Review of Systems  All other systems reviewed and are negative.      Objective:   Physical Exam Vitals reviewed.  Constitutional:      General: He is not in acute distress.    Appearance: He is not ill-appearing, toxic-appearing or diaphoretic.  Cardiovascular:     Rate and Rhythm: Normal rate.     Heart sounds: Normal heart sounds.  Pulmonary:     Effort: Pulmonary effort is normal. No tachypnea or respiratory distress.     Breath sounds: No stridor.  Chest:    Musculoskeletal:     Right lower leg: No edema.     Left lower leg: No edema.  Neurological:     Mental Status: He is alert.           Assessment & Plan:  Sensation of chest pressure - Plan: EKG 12-Lead  Patient denies any angina.  There is no association of the pressure-like pain to exercise.  It does not intensify with exercise or with walking.  I believe most likely this is a muscular injury that he suffered while lifting the heavy box.  His EKG today shows a paced rhythm so I reassured the patient I do not feel he did any damage to the pacemaker.  We did discuss symptoms of angina and if he develops any symptoms of angina he is to notify his cardiologist immediately however at the present time I believe this is most likely muscular.

## 2019-06-11 ENCOUNTER — Ambulatory Visit: Payer: Medicare Other | Admitting: Nurse Practitioner

## 2019-06-12 ENCOUNTER — Ambulatory Visit: Payer: Medicare Other | Attending: Internal Medicine

## 2019-06-12 ENCOUNTER — Other Ambulatory Visit: Payer: Self-pay

## 2019-06-12 DIAGNOSIS — Z20822 Contact with and (suspected) exposure to covid-19: Secondary | ICD-10-CM

## 2019-06-14 ENCOUNTER — Other Ambulatory Visit: Payer: Medicare Other

## 2019-06-15 ENCOUNTER — Telehealth: Payer: Self-pay | Admitting: *Deleted

## 2019-06-15 NOTE — Telephone Encounter (Signed)
Pt called to check the status of covid 19 test result. No results yet.

## 2019-06-16 LAB — NOVEL CORONAVIRUS, NAA: SARS-CoV-2, NAA: NOT DETECTED

## 2019-07-05 ENCOUNTER — Other Ambulatory Visit: Payer: Self-pay

## 2019-07-05 ENCOUNTER — Ambulatory Visit (INDEPENDENT_AMBULATORY_CARE_PROVIDER_SITE_OTHER): Payer: Medicare Other | Admitting: Podiatry

## 2019-07-05 ENCOUNTER — Encounter: Payer: Self-pay | Admitting: Podiatry

## 2019-07-05 DIAGNOSIS — Q828 Other specified congenital malformations of skin: Secondary | ICD-10-CM | POA: Diagnosis not present

## 2019-07-07 NOTE — Progress Notes (Signed)
He presents today chief complaint of painful calluses subfifth bilaterally.  Objective: Vital signs are stable he is alert oriented x3 pulses are palpable.  Reactive hyperkeratotic lesions or porokeratotic lesions beneath the fifth metatarsal heads bilaterally.  Moderately tender on palpation there is no boggy bursa at this point.  Assessment: Porokeratosis subfifth met head bilateral.  Plan: Mechanical debridement today followed by application of Cantharone for chemical debridement to be left on until tomorrow morning and then thoroughly washed off.  I will follow-up with him as needed.

## 2019-07-31 ENCOUNTER — Other Ambulatory Visit: Payer: Self-pay

## 2019-07-31 ENCOUNTER — Ambulatory Visit (INDEPENDENT_AMBULATORY_CARE_PROVIDER_SITE_OTHER): Payer: Medicare Other | Admitting: *Deleted

## 2019-07-31 ENCOUNTER — Other Ambulatory Visit: Payer: Self-pay | Admitting: Family Medicine

## 2019-07-31 ENCOUNTER — Other Ambulatory Visit: Payer: Medicare Other

## 2019-07-31 DIAGNOSIS — I442 Atrioventricular block, complete: Secondary | ICD-10-CM | POA: Diagnosis not present

## 2019-07-31 DIAGNOSIS — E785 Hyperlipidemia, unspecified: Secondary | ICD-10-CM

## 2019-07-31 DIAGNOSIS — I10 Essential (primary) hypertension: Secondary | ICD-10-CM

## 2019-07-31 DIAGNOSIS — E118 Type 2 diabetes mellitus with unspecified complications: Secondary | ICD-10-CM

## 2019-07-31 LAB — CUP PACEART REMOTE DEVICE CHECK
Battery Remaining Longevity: 74 mo
Battery Remaining Percentage: 95.5 %
Battery Voltage: 3.02 V
Brady Statistic AP VP Percent: 1.1 %
Brady Statistic AP VS Percent: 1 %
Brady Statistic AS VP Percent: 89 %
Brady Statistic AS VS Percent: 9.4 %
Brady Statistic RA Percent Paced: 1 %
Brady Statistic RV Percent Paced: 90 %
Date Time Interrogation Session: 20210302020012
Implantable Lead Implant Date: 20201201
Implantable Lead Implant Date: 20201201
Implantable Lead Location: 753859
Implantable Lead Location: 753860
Implantable Pulse Generator Implant Date: 20201201
Lead Channel Impedance Value: 490 Ohm
Lead Channel Impedance Value: 650 Ohm
Lead Channel Pacing Threshold Amplitude: 0.5 V
Lead Channel Pacing Threshold Amplitude: 1 V
Lead Channel Pacing Threshold Pulse Width: 0.5 ms
Lead Channel Pacing Threshold Pulse Width: 0.5 ms
Lead Channel Sensing Intrinsic Amplitude: 3.1 mV
Lead Channel Sensing Intrinsic Amplitude: 5.6 mV
Lead Channel Setting Pacing Amplitude: 3.5 V
Lead Channel Setting Pacing Amplitude: 3.5 V
Lead Channel Setting Pacing Pulse Width: 0.5 ms
Lead Channel Setting Sensing Sensitivity: 2 mV
Pulse Gen Model: 2272
Pulse Gen Serial Number: 9182765

## 2019-08-01 ENCOUNTER — Encounter: Payer: Self-pay | Admitting: Family Medicine

## 2019-08-01 NOTE — Progress Notes (Signed)
PPM Remote  

## 2019-08-02 LAB — LIPID PANEL
Cholesterol: 135 mg/dL (ref <200)
HDL: 46 mg/dL (ref >=40)
LDL Cholesterol (Calc): 73 mg/dL (calc)
Non-HDL Cholesterol (Calc): 89 mg/dL (calc) (ref <130)
Total CHOL/HDL Ratio: 2.9 (calc) (ref <5.0)
Triglycerides: 84 mg/dL (ref <150)

## 2019-08-02 LAB — COMPREHENSIVE METABOLIC PANEL
AG Ratio: 2.1 (calc) (ref 1.0–2.5)
ALT: 19 U/L (ref 9–46)
AST: 19 U/L (ref 10–35)
Albumin: 4.4 g/dL (ref 3.6–5.1)
Alkaline phosphatase (APISO): 57 U/L (ref 35–144)
BUN: 18 mg/dL (ref 7–25)
CO2: 26 mmol/L (ref 20–32)
Calcium: 9.4 mg/dL (ref 8.6–10.3)
Chloride: 100 mmol/L (ref 98–110)
Creat: 0.89 mg/dL (ref 0.70–1.25)
Globulin: 2.1 g/dL (calc) (ref 1.9–3.7)
Glucose, Bld: 118 mg/dL — ABNORMAL HIGH (ref 65–99)
Potassium: 4.4 mmol/L (ref 3.5–5.3)
Sodium: 137 mmol/L (ref 135–146)
Total Bilirubin: 0.5 mg/dL (ref 0.2–1.2)
Total Protein: 6.5 g/dL (ref 6.1–8.1)

## 2019-08-02 LAB — CBC WITH DIFFERENTIAL/PLATELET
Absolute Monocytes: 628 cells/uL (ref 200–950)
Basophils Absolute: 62 cells/uL (ref 0–200)
Basophils Relative: 0.9 %
Eosinophils Absolute: 242 cells/uL (ref 15–500)
Eosinophils Relative: 3.5 %
HCT: 45 % (ref 38.5–50.0)
Hemoglobin: 15 g/dL (ref 13.2–17.1)
Lymphs Abs: 1967 cells/uL (ref 850–3900)
MCH: 29 pg (ref 27.0–33.0)
MCHC: 33.3 g/dL (ref 32.0–36.0)
MCV: 87 fL (ref 80.0–100.0)
MPV: 9.4 fL (ref 7.5–12.5)
Monocytes Relative: 9.1 %
Neutro Abs: 4002 cells/uL (ref 1500–7800)
Neutrophils Relative %: 58 %
Platelets: 211 10*3/uL (ref 140–400)
RBC: 5.17 10*6/uL (ref 4.20–5.80)
RDW: 12 % (ref 11.0–15.0)
Total Lymphocyte: 28.5 %
WBC: 6.9 10*3/uL (ref 3.8–10.8)

## 2019-08-02 LAB — TEST AUTHORIZATION

## 2019-08-02 LAB — HEMOGLOBIN A1C W/OUT EAG: Hgb A1c MFr Bld: 7.1 % of total Hgb — ABNORMAL HIGH (ref <5.7)

## 2019-08-06 ENCOUNTER — Ambulatory Visit: Payer: Medicare Other | Attending: Internal Medicine

## 2019-08-06 ENCOUNTER — Ambulatory Visit: Payer: Medicare Other | Admitting: Family Medicine

## 2019-08-06 ENCOUNTER — Encounter: Payer: Medicare Other | Admitting: Internal Medicine

## 2019-08-06 DIAGNOSIS — Z20822 Contact with and (suspected) exposure to covid-19: Secondary | ICD-10-CM

## 2019-08-07 LAB — NOVEL CORONAVIRUS, NAA: SARS-CoV-2, NAA: NOT DETECTED

## 2019-08-13 ENCOUNTER — Telehealth: Payer: Self-pay | Admitting: Cardiovascular Disease

## 2019-08-13 NOTE — Telephone Encounter (Signed)
   Pt is requesting if he can bring his wife with him to his appt tomorrow. He said he just wanted to make sure he doesn't miss anything important from Dr. Claiborne Lee [name] and be more comfortable if his wife there with him.  Please advise

## 2019-08-14 ENCOUNTER — Other Ambulatory Visit: Payer: Self-pay

## 2019-08-14 ENCOUNTER — Encounter: Payer: Self-pay | Admitting: Cardiovascular Disease

## 2019-08-14 ENCOUNTER — Ambulatory Visit (INDEPENDENT_AMBULATORY_CARE_PROVIDER_SITE_OTHER): Payer: Medicare Other | Admitting: Cardiovascular Disease

## 2019-08-14 DIAGNOSIS — I441 Atrioventricular block, second degree: Secondary | ICD-10-CM | POA: Diagnosis not present

## 2019-08-14 DIAGNOSIS — E118 Type 2 diabetes mellitus with unspecified complications: Secondary | ICD-10-CM

## 2019-08-14 DIAGNOSIS — G4733 Obstructive sleep apnea (adult) (pediatric): Secondary | ICD-10-CM | POA: Diagnosis not present

## 2019-08-14 DIAGNOSIS — I2510 Atherosclerotic heart disease of native coronary artery without angina pectoris: Secondary | ICD-10-CM

## 2019-08-14 DIAGNOSIS — Z950 Presence of cardiac pacemaker: Secondary | ICD-10-CM

## 2019-08-14 DIAGNOSIS — I10 Essential (primary) hypertension: Secondary | ICD-10-CM | POA: Diagnosis not present

## 2019-08-14 DIAGNOSIS — E785 Hyperlipidemia, unspecified: Secondary | ICD-10-CM

## 2019-08-14 DIAGNOSIS — Z9861 Coronary angioplasty status: Secondary | ICD-10-CM

## 2019-08-14 DIAGNOSIS — I358 Other nonrheumatic aortic valve disorders: Secondary | ICD-10-CM | POA: Diagnosis not present

## 2019-08-14 MED ORDER — ZOLPIDEM TARTRATE 5 MG PO TABS: 5.0000 mg | ORAL_TABLET | Freq: Every evening | ORAL | 0 refills | Status: DC | PRN

## 2019-08-14 MED ORDER — ZOLPIDEM TARTRATE 5 MG PO TABS: 5.0000 mg | ORAL_TABLET | Freq: Every evening | ORAL | 1 refills | Status: DC | PRN

## 2019-08-14 NOTE — Telephone Encounter (Signed)
Called and spoke with pt. Notified that it was fine for his wife to come to appt today. No other questions or concerns at this time.

## 2019-08-14 NOTE — Patient Instructions (Signed)
Medication Instructions:  Begin taking ZOLPIDEM 5MG  (1 TABLET) AS NEEDED AT BEDTIME *If you need a refill on your cardiac medications before your next appointment, please call your pharmacy*  Follow-Up: At [facility], you and your health needs are our priority.  As part of our continuing mission to provide you with exceptional heart care, we have created designated Provider Care Teams.  These Care Teams include your primary Cardiologist (physician) and Advanced Practice Providers (APPs -  Physician Assistants and Nurse Practitioners) who all work together to provide you with the care you need, when you need it.  We recommend signing up for the patient portal called "MyChart".  Sign up information is provided on this After Visit Summary.  MyChart is used to connect with patients for Virtual Visits (Telemedicine).  Patients are able to view lab/test results, encounter notes, upcoming appointments, etc.  Non-urgent messages can be sent to your provider as well.   To learn more about what you can do with MyChart, go to [email or web address].    Your next appointment:   6 month(s)  The format for your next appointment:   In Person  Provider:   Shelva Majestic, MD

## 2019-08-14 NOTE — Progress Notes (Signed)
Patient ID: Lee [name], male   DOB: 1950/10/25, 69 y.o.   MRN: [id number]     Primary M.D.: Dr. Jenna Luo  HPI: Lee [name], is a 69 y.o. male who presents to the office today for a 6 month followup cardiology evaluation.  Lee [name] has known CAD and in 2009 underwent stenting of his mid LAD. In 2012 Lee [name] underwent staged intervention with stenting of his proximal RCA and at a new site in his proximal LAD. A nuclear perfusion study February 2014 showed normal perfusion and function. In January 2015, Lee [name] was seen by [name] in the office with complaints of chest pressure. Lee [name] underwent cardiac catheterization this chest pain which was done by Dr. [name] on 06/29/2013. Catheterization did not demonstrate significant restenosis of his LAD stents with mild 20% narrowing in the proximal stent and 40% mid stent. The circumflex was normal. His RCA had 40% narrowing just proximal to a widely patent stent in the mid vessel. Nitrate therapy was added to his medical regimen but due to significant nitrate mediated headaches this ultimately was discontinued.   In 2007, a sleep study  suggested upper airway resistance syndrome without overall sleep apnea with the exception of mild sleep apnea with REM sleep; at that time CPAP was not recommended. In June 2014 Lee [name] was having symptoms suggestive of progressive sleep apnea. Lee [name] ultimately underwent a sleep study. I do not have the specifics of this diagnostic polysomnogram but subsequently Lee [name] did undergo a CPAP titration to apparently his AHI was 12 on his initial study and Lee [name] dropped his oxygen from 96% to 83%. Lee [name] ultimately was titrated up to a CPAP pressure of 11 cm.  Additional problems also include obesity , as well as a history of palpitations.  I had titrated his beta blocker therapy which improved his palpitations.  Lee [name] remains active.  Lee [name] denies any episodes of chest pain.  Lee [name] denies presyncope or syncope.  A follow-up myocardial  perfusion study on 09/23/2015 showed an ejection fraction at 62%.  There were no ECG changes.  Lee [name] had normal perfusion without scar or ischemia.  I  saw him in June 2018 at which time was experiencing occasional palpitations.  We discussed reduction of caffeine and I added low-dose Cardizem CD to his regimen.    When I  saw him in July 2019 Lee [name] was doing well and was without recurrent chest pain.  Lee [name] was continuing to use CPAP with 1 or percent compliance; [facility] was his DME.   Since I last saw him, Lee [name] was hospitalized in April 2020 with syncope  suspected to be secondary to a vagal episode.  Lee [name] was noted to have significant sinus bradycardia and diltiazem was stopped.  An echo Doppler study on September 20, 2018 showed an EF of 60 to 65% with grade 2 diastolic dysfunction, clinical vacation and aortic valve sclerosis.  Lee [name] was discharged home with plans for monitoring on Coreg.  Lee [name] was admitted on Oct 10, 2018 with recurrent syncope and was found to have significant pauses on his monitor and had a 36 seconds strip showing multiple pauses lasting 6, 7, 9, and 12 seconds.  Lee [name] was initially found by EMS in complete heart block with a ventricular rate in the 20s which improved to 2-1 and eventually sinus rhythm.  Lee [name] was admitted to the hospital, carvedilol was discontinued.  Lee [name] was last evaluated by Dr. Rayann Heman in the office in June 2020 and was doing well without  further symptoms of bradycardia, palpitations chest pain shortness of breath dizziness presyncope or syncope.  On May 01, 2019 Lee [name] underwent insertion of a [employer] Jude dual-chamber permanent pacemaker by Dr. Rayann Heman for symptomatic Mobitz 2 second-degree AV block.   Since I last saw him, Lee [name] received a new ResMed air sense 10 CPAP unit.  [employer] is his DME provider.  Has not yet been linked to our office.  Lee [name] has had issues with sleep initiation but notices significant improvement with his CPAP device.  Lee [name] presents for  reevaluation.   Past Medical History:  Diagnosis Date  . Allergic rhinitis    chronic sinusitis  . Arthritis   . CAD (coronary artery disease)    a. 2009 PCI/DES to mLAD; b. 05/2010 s/p DES RCA and LAD.; c. 05/2013 Cath: LAD mild ISR, LCX nl, RCA 40p, patent stents.  . Cataract    has had lasik surg  . COPD (chronic obstructive pulmonary disease) ([place])   . Diabetes mellitus type 2, controlled, without complications ([place])   . Diverticulosis   . DJD (degenerative joint disease)   . Esophagitis 1991   grade 1  . GERD (gastroesophageal reflux disease)    Hiatal hernia  . Hemorrhoids   . Hyperlipidemia   . Hypertensive heart disease   . Iron deficiency anemia   . Migraines   . Paroxysmal atrial fibrillation (HCC)    a. s/p afib ablation 10/22/08 (J. Allred).  . PVC's (premature ventricular contractions)   . Sleep apnea    Questionalble: RDI during the total sleep time 6h 28 mins was 3.55/hr during REM sleep was at 5.71/hr. Supine AHI was 5.93/hr.  . Syncope 08/2018    Past Surgical History:  Procedure Laterality Date  . CARDIAC CATHETERIZATION  05/2010   The proximal LAD was then predilated with 2.0 x 12 trek. this was then stented with a 2.5 x 16 promus Element drug-eluting stent at 14 atmosphere and prostdilated with 2.75 x 12 [place] trek at 16 atmospheres(2.8 mm) resulting the reduction of 80% proximal LAD stenosis to 0% residual with excellent flow.  Marland Kitchen CARDIAC CATHETERIZATION  06/05/2010   Successful percutaneous coronary intervention to the right coronary artery with percutaneous transluminal coronary angioplasty/stenting and insertion 3.0 x 16 mm Promus DES post dilated to 3.35 mm with stenosis being reduced to 0%  . CARDIAC CATHETERIZATION  02/2008   Post dilatation was performed using a 2.75 x 9 [place] sprinter, 10 atmospheres for 40 seconds and then 9 atmosphere for 35 sec. This resulted in the 80% area of narrowing pre-intervention, now appearing to normal. There was no edvidence of  the dissection or thrombus and there was TIMI III flow pre and post.  . CARDIAC CATHETERIZATION  02/2006  . CORONARY ANGIOPLASTY WITH STENT PLACEMENT     3 stents/ 4 caths  . EYE SURGERY    . KNEE ARTHROSCOPY     right  . LASIK    . LEFT HEART CATHETERIZATION WITH CORONARY ANGIOGRAM N/A 06/29/2013   Procedure: LEFT HEART CATHETERIZATION WITH CORONARY ANGIOGRAM;  Surgeon: Leonie Man, MD;  Location[facility];  Service: Cardiovascular;  Laterality: N/A;  . NASAL SINUS SURGERY  2001  . NECK SURGERY     Cervical fusion  . PACEMAKER IMPLANT N/A 05/01/2019   Procedure: PACEMAKER IMPLANT;  Surgeon: [name], MD;  Location: [facility] CV LAB;  Service: Cardiovascular;  Laterality: N/A;  . RADIOFREQUENCY ABLATION  May 2010   atrial fibrillation  . ROTATOR CUFF  REPAIR     left  . SHOULDER OPEN ROTATOR CUFF REPAIR     right  . SPINE SURGERY    . WRIST ARTHROCENTESIS     left    Allergies  Allergen Reactions  . Ibuprofen Swelling    Whole body swelled  . Other Shortness Of Breath    BETA BLOCKERS  . Topamax [Topiramate] Other (See Comments)    Pt states it made his tongue feel "scalded" and Lee [name] couldn't eat   . Toprol Xl [Metoprolol Succinate] Other (See Comments)    Personality change  . Metoprolol-Hctz Er Other (See Comments)    Indigestion,mouth raw    Current Outpatient Medications  Medication Sig Dispense Refill  . Accu-Chek FastClix Lancets MISC Check BS bid E11.9 102 each 3  . acetaminophen (TYLENOL) 325 MG tablet Take 1-2 tablets (325-650 mg total) by mouth every 4 (four) hours as needed for mild pain.    Marland Kitchen amLODipine (NORVASC) 10 MG tablet Take 1 tablet (10 mg total) by mouth daily. 90 tablet 3  . aspirin EC 81 MG tablet Take 81 mg by mouth at bedtime.    . Blood Glucose Monitoring Suppl (ACCU-CHEK AVIVA PLUS) w/Device KIT Check BS bid E11.9 1 kit 1  . cholecalciferol (VITAMIN D) 1000 UNITS tablet Take 1,000 Units by mouth at bedtime.     . citalopram (CELEXA)  40 MG tablet TAKE 1 TABLET(40 MG) BY MOUTH DAILY 90 tablet 3  . Coenzyme Q10 (CO Q 10) 100 MG CAPS Take 100 mg by mouth daily with breakfast.     . empagliflozin (JARDIANCE) 25 MG TABS tablet Take 25 mg by mouth daily before breakfast. 30 tablet 5  . Erenumab-aooe (AIMOVIG) 140 MG/ML SOAJ Inject 140 mg into the skin every 30 (thirty) days. On or about the 1st of each month    . fexofenadine (ALLEGRA) 180 MG tablet Take 180 mg by mouth daily with breakfast.     . fluticasone (FLONASE) 50 MCG/ACT nasal spray INSTILL 2 SPRAYS IN THE  NOSE AT BEDTIME 48 g 3  . furosemide (LASIX) 40 MG tablet TAKE 1 TABLET BY MOUTH  DAILY 90 tablet 3  . glucose blood (ACCU-CHEK AVIVA PLUS) test strip Check BS bid E11.9 100 each 12  . Lancets Misc. (ACCU-CHEK FASTCLIX LANCET) KIT Check BS bid E11.9 1 kit 1  . Multiple Vitamin (MULTIVITAMIN WITH MINERALS) TABS tablet Take 1 tablet by mouth daily with breakfast.    . nitroGLYCERIN (NITROSTAT) 0.4 MG SL tablet PLACE ONE TABLET UNDER THE TONGUE EVERY 5 MINUTES AS NEEDED FOR CHEST PAIN (Patient taking differently: Place 0.4 mg under the tongue every 5 (five) minutes as needed for chest pain. ) 25 tablet 3  . oxyCODONE-acetaminophen (PERCOCET) 10-325 MG tablet Take 1 tablet by mouth every 8 (eight) hours as needed for pain. 21 tablet 0  . pantoprazole (PROTONIX) 40 MG tablet TAKE 1 TABLET BY MOUTH  EVERY DAY 90 tablet 3  . Polyvinyl Alcohol-Povidone (REFRESH OP) Place 1 drop into both eyes at bedtime as needed (dry eyes).    . potassium chloride (MICRO-K) 10 MEQ CR capsule TAKE 1 CAPSULE BY MOUTH  EVERY DAY 90 capsule 3  . rosuvastatin (CRESTOR) 40 MG tablet TAKE 1 TABLET BY MOUTH  DAILY 90 tablet 1  . telmisartan (MICARDIS) 40 MG tablet TAKE 1 TABLET BY MOUTH  DAILY 90 tablet 3  . Ubrogepant (UBRELVY) 50 MG TABS Take 50 mg by mouth daily as needed (at onset of migraine headache).    Marland Kitchen  Suvorexant (BELSOMRA) 10 MG TABS Take 1 tablet by mouth at bedtime as needed (insomnia). 30  tablet 0  . zolpidem (AMBIEN) 5 MG tablet Take 1 tablet (5 mg total) by mouth at bedtime as needed for sleep. 30 tablet 0   No current facility-administered medications for this visit.    Socially Lee [name]'s married has 2 children Lee [name] works in Development worker, international aid. There is a remote tobacco history but Lee [name] quit in 1989.  ROS General: Negative; No fevers, chills, or night sweats; positive for obesity HEENT: Negative; No changes in vision or hearing, sinus congestion, difficulty swallowing Pulmonary: Negative; No cough, wheezing, shortness of breath, hemoptysis Cardiovascular: See history of present illness;  GI: Negative; No nausea, vomiting, diarrhea, or abdominal pain GU: Positive for GERD; No dysuria, hematuria, or difficulty voiding Musculoskeletal: Negative; no myalgias, joint pain, or weakness Hematologic/Oncology: Negative; no easy bruising, bleeding Endocrine: Negative; no heat/cold intolerance; no diabetes Neuro: Positive for history of migraine headaches; no changes in balance,  Skin: Negative; No rashes or skin lesions Psychiatric: Negative; No behavioral problems, depression Sleep: Positive for obstructive sleep apnea,  on CPAP therapy; No snoring, daytime sleepiness, hypersomnolence, bruxism, restless legs, hypnogognic hallucinations, no cataplexy Other comprehensive 14 point system review is negative.  PE Pulse 81   Temp (!) 97 F (36.1 C)   Ht '5\' 8"'  (1.727 m)   Wt 253 lb 6.4 oz (114.9 kg)   SpO2 96%   BMI 38.53 kg/m   Morbid obesity  Repeat blood pressure by me 116/70  Wt Readings from Last 3 Encounters:  08/14/19 253 lb 6.4 oz (114.9 kg)  05/21/19 256 lb (116.1 kg)  05/08/19 263 lb (119.3 kg)    General: Alert, oriented, no distress.  Skin: normal turgor, no rashes, warm and dry; bearded HEENT: Normocephalic, atraumatic. Pupils equal round and reactive to light; sclera anicteric; extraocular muscles intact;  Nose without nasal septal hypertrophy Mouth/Parynx benign;  Mallinpatti scale 3 Neck: No JVD, no carotid bruits; normal carotid upstroke Lungs: clear to ausculatation and percussion; no wheezing or rales Chest wall: without tenderness to palpitation Heart: PMI not displaced, RRR, s1 s2 normal, 8-5/0 systolic murmur in the aortic area, no diastolic murmur, no rubs, gallops, thrills, or heaves Abdomen: soft, nontender; no hepatosplenomehaly, BS+; abdominal aorta nontender and not dilated by palpation. Back: no CVA tenderness Pulses 2+ Musculoskeletal: full range of motion, normal strength, no joint deformities Extremities: no clubbing cyanosis or edema, Homan's sign negative  Neurologic: grossly nonfocal; Cranial nerves grossly wnl Psychologic: Normal mood and affect   ECG (independently read by me): Atrial-sensed ventricular paced rhythm with prolonged AV conduction with PR interval 246 ms.  September 2020 ECG (independently read by me): Sinus rhythm at 83 bpm with first-degree AV block with a PR of 220 ms.  Right bundle branch block with repolarization changes.  July 2019 ECG (independently read by me): Normal sinus rhythm at 66 bpm.  First-degree AV block.  Right bundle branch block with repolarization changes.  June 2018 ECG (independently read by me): Normal sinus rhythm at 65 bpm.  First degree AV block.  Right bundle branch block with repolarization changes.  PR interval 218 ms.  July 2017 ECG (independently read by me): Sinus rhythm at 69 bpm with right bundle branch block and first-degree AV block.  Q wave is present in lead 3.  April 2016 ECG (independently read by me): Normal sinus rhythm at 68 with right bundle branch block and repolarization changes.  First-degree AV block.  No  ectopy  October 2015 ECG (independently read by me): Normal sinus rhythm with right bundle branch block.  Borderline first-degree AV block with PR interval 206 ms.  No ectopy  Prior February 2015 ECG (independently read by me): Normal sinus rhythm at 66 beats per  minute, right bundle branch block with repolarization changes, first-degree AV block.  ECG from 12/12/2012: NSR at 69, RBBB  LABS:  BMP Latest Ref Rng & Units 07/31/2019 05/02/2019 05/01/2019  Glucose 65 - 99 mg/dL 118(H) 177(H) 162(H)  BUN 7 - 25 mg/dL '18 11 12  ' Creatinine 0.70 - 1.25 mg/dL 0.89 0.77 0.75  BUN/Creat Ratio 6 - 22 (calc) NOT APPLICABLE - -  Sodium 119 - 146 mmol/L 137 134(L) 135  Potassium 3.5 - 5.3 mmol/L 4.4 4.0 3.9  Chloride 98 - 110 mmol/L 100 98 97(L)  CO2 20 - 32 mmol/L '26 25 23  ' Calcium 8.6 - 10.3 mg/dL 9.4 9.0 9.5    Hepatic Function Latest Ref Rng & Units 07/31/2019 05/01/2019 04/30/2019  Total Protein 6.1 - 8.1 g/dL 6.5 6.3(L) 6.5  Albumin 3.5 - 5.0 g/dL - 3.9 4.0  AST 10 - 35 U/L '19 22 26  ' ALT 9 - 46 U/L 19 33 34  Alk Phosphatase 38 - 126 U/L - 71 67  Total Bilirubin 0.2 - 1.2 mg/dL 0.5 0.4 0.4    CBC Latest Ref Rng & Units 07/31/2019 05/01/2019 04/30/2019  WBC 3.8 - 10.8 Thousand/uL 6.9 8.7 7.0  Hemoglobin 13.2 - 17.1 g/dL 15.0 14.1 13.8  Hematocrit 38.5 - 50.0 % 45.0 42.4 42.1  Platelets 140 - 400 Thousand/uL 211 238 226   Lab Results  Component Value Date   MCV 87.0 07/31/2019   MCV 87.4 05/01/2019   MCV 89.4 04/30/2019    BNP    Component Value Date/Time   PROBNP 36.0 06/04/2010 2206    Lipid Panel     Component Value Date/Time   CHOL 135 07/31/2019 0816   CHOL 155 11/21/2012 1025   TRIG 84 07/31/2019 0816   TRIG 81 11/21/2012 1025   HDL 46 07/31/2019 0816   HDL 45 11/21/2012 1025   CHOLHDL 2.9 07/31/2019 0816   VLDL 10 09/13/2016 0822   LDLCALC 73 07/31/2019 0816   LDLCALC 94 11/21/2012 1025     RADIOLOGY: No results found.  IMPRESSION:  1. CAD S/P percutaneous coronary angioplasty   2. OSA (obstructive sleep apnea)   3. Essential hypertension   4. Mobitz type 2 second degree atrioventricular block   5. Cardiac pacemaker in situ   6. Aortic valve sclerosis   7. Hyperlipidemia LDL goal <70   8. Type 2 diabetes mellitus  with complication, without long-term current use of insulin (HCC)     ASSESSMENT AND PLAN: Lee [name] Is a 69 year old male who has CAD and is status post intervention to his proximal and mid LAD as well as his RCA.  A repeat cardiac catheterization by Dr. Ellyn Hack did not demonstrate significant cardiac etiology to his chest pain. Lee [name] was empirically started on nitrate therapy but this resulted in frequent migraine headaches.  Lee [name] was hospitalized in April and again in May 2020 with syncope and was found to have multiple pauses ranging from 6,7,9 and 12 seconds and transient complete heart block with ventricular rate in the 20s.  Lee [name] was off all AV nodal agents moving forward.  However due to current symptomatic Mobitz 2 second-degree AV block Lee [name] underwent insertion of a dual-chamber [facility] Jude permanent pacemaker by Dr.  Allred on May 01, 2019.  Lee [name] will be seeing Dr. Rayann Heman back in follow-up in April.  Presently, his blood pressure is stable on his current regimen consisting of amlodipine 10 mg, telmisartan 40 mg and furosemide 40 mg daily.  Lee [name] is on rosuvastatin 40 mg for hyperlipidemia with target LDL less than 70.  Lee [name] is tolerating this well.  Lee [name] received a new ResMed AirSense 10 CPAP unit and [employer] is his DME company.  Lee [name] admits to excellent compliance with 100% use.  We will contact [employer] so that his new device can be linked to our office for review and to enable [place] to make adjustments as necessary.  Is had issues with sleep initiation I have given him a prescription for zolpidem 5 mg #30.  Lee [name] is now on Aimovig for migraine headaches with monthly injections.  Lee [name] is diabetic on Jardiance.  I will see him in 6 months for follow-up evaluation.  Troy Sine, MD, [facility]  08/16/2019 6:47 PM

## 2019-08-16 ENCOUNTER — Encounter: Payer: Self-pay | Admitting: Cardiovascular Disease

## 2019-08-21 ENCOUNTER — Other Ambulatory Visit: Payer: Self-pay | Admitting: Cardiovascular Disease

## 2019-09-03 ENCOUNTER — Other Ambulatory Visit: Payer: Self-pay | Admitting: Family Medicine

## 2019-09-03 MED ORDER — OXYCODONE-ACETAMINOPHEN 10-325 MG PO TABS: 1.0000 | ORAL_TABLET | Freq: Three times a day (TID) | ORAL | 0 refills | Status: DC | PRN

## 2019-09-03 NOTE — Telephone Encounter (Signed)
Ok to refill??  Last office visit 05/21/2019.  Last refill 03/29/2019.

## 2019-09-06 ENCOUNTER — Telehealth: Payer: Self-pay | Admitting: Internal Medicine

## 2019-09-06 NOTE — Telephone Encounter (Signed)
New message     Patient has an appointment with Dr Rayann Heman tomorrow.  He want his wife to come to the appt with him.  Patient states that he cannot remember things and need his wife to come.  If ok, you do not have to call pt back, just mark the chart.

## 2019-09-07 ENCOUNTER — Other Ambulatory Visit: Payer: Self-pay

## 2019-09-07 ENCOUNTER — Encounter: Payer: Self-pay | Admitting: Internal Medicine

## 2019-09-07 ENCOUNTER — Ambulatory Visit (INDEPENDENT_AMBULATORY_CARE_PROVIDER_SITE_OTHER): Payer: Medicare Other | Admitting: Internal Medicine

## 2019-09-07 VITALS — BP 120/82 | HR 75 | Ht 68.0 in | Wt 248.0 lb

## 2019-09-07 DIAGNOSIS — I442 Atrioventricular block, complete: Secondary | ICD-10-CM | POA: Diagnosis not present

## 2019-09-07 DIAGNOSIS — G4733 Obstructive sleep apnea (adult) (pediatric): Secondary | ICD-10-CM | POA: Diagnosis not present

## 2019-09-07 DIAGNOSIS — I10 Essential (primary) hypertension: Secondary | ICD-10-CM | POA: Diagnosis not present

## 2019-09-07 DIAGNOSIS — Z9861 Coronary angioplasty status: Secondary | ICD-10-CM

## 2019-09-07 DIAGNOSIS — I2510 Atherosclerotic heart disease of native coronary artery without angina pectoris: Secondary | ICD-10-CM | POA: Diagnosis not present

## 2019-09-07 LAB — CUP PACEART INCLINIC DEVICE CHECK
Battery Remaining Longevity: 111 mo
Battery Voltage: 3.01 V
Brady Statistic RA Percent Paced: 0.98 %
Brady Statistic RV Percent Paced: 91 %
Date Time Interrogation Session: 20210409152435
Implantable Lead Implant Date: 20201201
Implantable Lead Implant Date: 20201201
Implantable Lead Location: 753859
Implantable Lead Location: 753860
Implantable Pulse Generator Implant Date: 20201201
Lead Channel Impedance Value: 462.5 Ohm
Lead Channel Impedance Value: 650 Ohm
Lead Channel Pacing Threshold Amplitude: 0.75 V
Lead Channel Pacing Threshold Amplitude: 0.75 V
Lead Channel Pacing Threshold Amplitude: 0.75 V
Lead Channel Pacing Threshold Pulse Width: 0.5 ms
Lead Channel Pacing Threshold Pulse Width: 0.5 ms
Lead Channel Pacing Threshold Pulse Width: 0.5 ms
Lead Channel Sensing Intrinsic Amplitude: 2.1 mV
Lead Channel Sensing Intrinsic Amplitude: 5.8 mV
Lead Channel Setting Pacing Amplitude: 2 V
Lead Channel Setting Pacing Amplitude: 2.5 V
Lead Channel Setting Pacing Pulse Width: 0.5 ms
Lead Channel Setting Sensing Sensitivity: 2 mV
Pulse Gen Model: 2272
Pulse Gen Serial Number: 9182765

## 2019-09-07 NOTE — Progress Notes (Signed)
PCP: Susy Frizzle, MD Primary Cardiologist: Claiborne Billings Primary EP:  Dr [name] Lee [name] is a 69 y.o. male who presents today for routine electrophysiology followup.  Since his pacemaker implant, the patient reports doing very well. He has noticed increased energy level since pacemaker implant. Today, he denies symptoms of palpitations, chest pain, shortness of breath,  lower extremity edema, dizziness, presyncope, or syncope.  The patient is otherwise without complaint today.   Past Medical History:  Diagnosis Date  . Allergic rhinitis    chronic sinusitis  . Arthritis   . CAD (coronary artery disease)    a. 2009 PCI/DES to mLAD; b. 05/2010 s/p DES RCA and LAD.; c. 05/2013 Cath: LAD mild ISR, LCX nl, RCA 40p, patent stents.  . Cataract    has had lasik surg  . COPD (chronic obstructive pulmonary disease) ([place])   . Diabetes mellitus type 2, controlled, without complications ([place])   . Diverticulosis   . DJD (degenerative joint disease)   . Esophagitis 1991   grade 1  . GERD (gastroesophageal reflux disease)    Hiatal hernia  . Hemorrhoids   . Hyperlipidemia   . Hypertensive heart disease   . Iron deficiency anemia   . Migraines   . Paroxysmal atrial fibrillation ([facility])    a. s/p afib ablation 10/22/08 (J. Katharin Schneider).  . PVC's (premature ventricular contractions)   . Sleep apnea    Questionalble: RDI during the total sleep time 6h 28 mins was 3.55/hr during REM sleep was at 5.71/hr. Supine AHI was 5.93/hr.  . Syncope 08/2018   Past Surgical History:  Procedure Laterality Date  . CARDIAC CATHETERIZATION  05/2010   The proximal LAD was then predilated with 2.0 x 12 trek. this was then stented with a 2.5 x 16 promus Element drug-eluting stent at 14 atmosphere and prostdilated with 2.75 x 12 [place] trek at 16 atmospheres(2.8 mm) resulting the reduction of 80% proximal LAD stenosis to 0% residual with excellent flow.  Marland Kitchen CARDIAC CATHETERIZATION  06/05/2010   Successful percutaneous  coronary intervention to the right coronary artery with percutaneous transluminal coronary angioplasty/stenting and insertion 3.0 x 16 mm Promus DES post dilated to 3.35 mm with stenosis being reduced to 0%  . CARDIAC CATHETERIZATION  02/2008   Post dilatation was performed using a 2.75 x 9  sprinter, 10 atmospheres for 40 seconds and then 9 atmosphere for 35 sec. This resulted in the 80% area of narrowing pre-intervention, now appearing to normal. There was no edvidence of the dissection or thrombus and there was TIMI III flow pre and post.  . CARDIAC CATHETERIZATION  02/2006  . CORONARY ANGIOPLASTY WITH STENT PLACEMENT     3 stents/ 4 caths  . EYE SURGERY    . KNEE ARTHROSCOPY     right  . LASIK    . LEFT HEART CATHETERIZATION WITH CORONARY ANGIOGRAM N/A 06/29/2013   Procedure: LEFT HEART CATHETERIZATION WITH CORONARY ANGIOGRAM;  Surgeon: Leonie Man, MD;  Location: [facility] CATH LAB;  Service: Cardiovascular;  Laterality: N/A;  . NASAL SINUS SURGERY  2001  . NECK SURGERY     Cervical fusion  . PACEMAKER IMPLANT N/A 05/01/2019   Procedure: PACEMAKER IMPLANT;  Surgeon: [name], MD;  Location: [facility] CV LAB;  Service: Cardiovascular;  Laterality: N/A;  . RADIOFREQUENCY ABLATION  May 2010   atrial fibrillation  . ROTATOR CUFF REPAIR     left  . SHOULDER OPEN ROTATOR CUFF REPAIR  right  . SPINE SURGERY    . WRIST ARTHROCENTESIS     left    ROS- all systems are reviewed and negative except as per HPI above  Current Outpatient Medications  Medication Sig Dispense Refill  . Accu-Chek FastClix Lancets MISC Check BS bid E11.9 102 each 3  . acetaminophen (TYLENOL) 325 MG tablet Take 1-2 tablets (325-650 mg total) by mouth every 4 (four) hours as needed for mild pain.    Marland Kitchen amLODipine (NORVASC) 10 MG tablet Take 1 tablet (10 mg total) by mouth daily. 90 tablet 3  . aspirin EC 81 MG tablet Take 81 mg by mouth at bedtime.    . Blood Glucose Monitoring Suppl (ACCU-CHEK AVIVA PLUS)  w/Device KIT Check BS bid E11.9 1 kit 1  . cholecalciferol (VITAMIN D) 1000 UNITS tablet Take 1,000 Units by mouth at bedtime.     . citalopram (CELEXA) 40 MG tablet TAKE 1 TABLET(40 MG) BY MOUTH DAILY 90 tablet 3  . Coenzyme Q10 (CO Q 10) 100 MG CAPS Take 100 mg by mouth daily with breakfast.     . empagliflozin (JARDIANCE) 25 MG TABS tablet Take 25 mg by mouth daily before breakfast. 30 tablet 5  . Erenumab-aooe (AIMOVIG) 140 MG/ML SOAJ Inject 140 mg into the skin every 30 (thirty) days. On or about the 1st of each month    . fexofenadine (ALLEGRA) 180 MG tablet Take 180 mg by mouth daily with breakfast.     . fluticasone (FLONASE) 50 MCG/ACT nasal spray INSTILL 2 SPRAYS IN THE  NOSE AT BEDTIME 48 g 3  . furosemide (LASIX) 40 MG tablet TAKE 1 TABLET BY MOUTH  DAILY 90 tablet 3  . glucose blood (ACCU-CHEK AVIVA PLUS) test strip Check BS bid E11.9 100 each 12  . Lancets Misc. (ACCU-CHEK FASTCLIX LANCET) KIT Check BS bid E11.9 1 kit 1  . Multiple Vitamin (MULTIVITAMIN WITH MINERALS) TABS tablet Take 1 tablet by mouth daily with breakfast.    . nitroGLYCERIN (NITROSTAT) 0.4 MG SL tablet PLACE ONE TABLET UNDER THE TONGUE EVERY 5 MINUTES AS NEEDED FOR CHEST PAIN (Patient taking differently: Place 0.4 mg under the tongue every 5 (five) minutes as needed for chest pain. ) 25 tablet 3  . oxyCODONE-acetaminophen (PERCOCET) 10-325 MG tablet Take 1 tablet by mouth every 8 (eight) hours as needed for pain. 21 tablet 0  . pantoprazole (PROTONIX) 40 MG tablet TAKE 1 TABLET BY MOUTH  EVERY DAY 90 tablet 3  . Polyvinyl Alcohol-Povidone (REFRESH OP) Place 1 drop into both eyes at bedtime as needed (dry eyes).    . potassium chloride (MICRO-K) 10 MEQ CR capsule TAKE 1 CAPSULE BY MOUTH  EVERY DAY 90 capsule 3  . rosuvastatin (CRESTOR) 40 MG tablet TAKE 1 TABLET BY MOUTH  DAILY 90 tablet 3  . telmisartan (MICARDIS) 40 MG tablet TAKE 1 TABLET BY MOUTH  DAILY 90 tablet 3  . Ubrogepant (UBRELVY) 50 MG TABS Take 50 mg  by mouth daily as needed (at onset of migraine headache).    . zolpidem (AMBIEN) 5 MG tablet Take 1 tablet (5 mg total) by mouth at bedtime as needed for sleep. 30 tablet 0   No current facility-administered medications for this visit.    Physical Exam: Vitals:   09/07/19 1506  BP: 120/82  Pulse: 75  SpO2: 93%  Weight: 248 lb (112.5 kg)  Height: '5\' 8"'  (1.727 m)    GEN- The patient is well appearing, alert and oriented x 3  today.   Head- normocephalic, atraumatic Eyes-  Sclera clear, conjunctiva pink Ears- hearing intact Oropharynx- clear Lungs- Clear to ausculation bilaterally, normal work of breathing Chest- pacemaker pocket is well healed Heart- Regular rate and rhythm (paced) GI- soft, NT, ND, + BS Extremities- no clubbing, cyanosis, or edema  Pacemaker interrogation- reviewed in detail today,  See PACEART report   Assessment and Plan:  1. Symptomatic complete heart block Normal pacemaker function See Pace Art report No changes today he is device dependant today  2. CAD No recent ischemic symptoms Continue current therapy  3.  OSA Followed by [name]  4.  HTN Stable No change required today  5. afib Well controlled post ablation in 2010 by me.  He is not on anticoagulation.  chads2vasc is at least 3.  We will consider [place] if he develops further afib.  Return to see me in a year  [name] MD, [facility] 09/07/2019 3:23 PM

## 2019-09-07 NOTE — Patient Instructions (Signed)
Medication Instructions:  Your physician recommends that you continue on your current medications as directed. Please refer to the Current Medication list given to you today.  Labwork: None ordered.  Testing/Procedures: None ordered.  Follow-Up: Your physician wants you to follow-up in: one year with Dr. Allred.   You will receive a reminder letter in the mail two months in advance. If you don't receive a letter, please call our office to schedule the follow-up appointment.  Remote monitoring is used to monitor your Pacemaker from home. This monitoring reduces the number of office visits required to check your device to one time per year. It allows us to keep an eye on the functioning of your device to ensure it is working properly. You are scheduled for a device check from home on 10/30/2019. You may send your transmission at any time that day. If you have a wireless device, the transmission will be sent automatically. After your physician reviews your transmission, you will receive a postcard with your next transmission date.  Any Other Special Instructions Will Be Listed Below (If Applicable).  If you need a refill on your cardiac medications before your next appointment, please call your pharmacy.   

## 2019-09-11 NOTE — Addendum Note (Signed)
Addended by: Rose Phi on: 09/11/2019 01:13 PM   Modules accepted: Orders

## 2019-09-15 ENCOUNTER — Other Ambulatory Visit: Payer: Self-pay | Admitting: Cardiovascular Disease

## 2019-09-23 ENCOUNTER — Other Ambulatory Visit: Payer: Self-pay | Admitting: Family Medicine

## 2019-10-20 ENCOUNTER — Other Ambulatory Visit: Payer: Self-pay | Admitting: Family Medicine

## 2019-10-26 ENCOUNTER — Other Ambulatory Visit: Payer: Self-pay | Admitting: Family Medicine

## 2019-10-30 ENCOUNTER — Ambulatory Visit (INDEPENDENT_AMBULATORY_CARE_PROVIDER_SITE_OTHER): Payer: Medicare Other | Admitting: *Deleted

## 2019-10-30 DIAGNOSIS — I441 Atrioventricular block, second degree: Secondary | ICD-10-CM | POA: Diagnosis not present

## 2019-10-30 LAB — CUP PACEART REMOTE DEVICE CHECK
Battery Remaining Longevity: 108 mo
Battery Remaining Percentage: 95.5 %
Battery Voltage: 3.01 V
Brady Statistic AP VP Percent: 1 %
Brady Statistic AP VS Percent: 1 %
Brady Statistic AS VP Percent: 90 %
Brady Statistic AS VS Percent: 9.6 %
Brady Statistic RA Percent Paced: 1 %
Brady Statistic RV Percent Paced: 90 %
Date Time Interrogation Session: 20210601020013
Implantable Lead Implant Date: 20201201
Implantable Lead Implant Date: 20201201
Implantable Lead Location: 753859
Implantable Lead Location: 753860
Implantable Pulse Generator Implant Date: 20201201
Lead Channel Impedance Value: 530 Ohm
Lead Channel Impedance Value: 630 Ohm
Lead Channel Pacing Threshold Amplitude: 0.75 V
Lead Channel Pacing Threshold Amplitude: 0.75 V
Lead Channel Pacing Threshold Pulse Width: 0.5 ms
Lead Channel Pacing Threshold Pulse Width: 0.5 ms
Lead Channel Sensing Intrinsic Amplitude: 3.4 mV
Lead Channel Sensing Intrinsic Amplitude: 5.9 mV
Lead Channel Setting Pacing Amplitude: 2 V
Lead Channel Setting Pacing Amplitude: 2.5 V
Lead Channel Setting Pacing Pulse Width: 0.5 ms
Lead Channel Setting Sensing Sensitivity: 2 mV
Pulse Gen Model: 2272
Pulse Gen Serial Number: 9182765

## 2019-10-31 NOTE — Progress Notes (Signed)
Remote pacemaker transmission.   

## 2019-11-06 ENCOUNTER — Other Ambulatory Visit: Payer: Medicare Other

## 2019-11-06 ENCOUNTER — Other Ambulatory Visit: Payer: Self-pay

## 2019-11-06 DIAGNOSIS — I10 Essential (primary) hypertension: Secondary | ICD-10-CM

## 2019-11-06 DIAGNOSIS — E118 Type 2 diabetes mellitus with unspecified complications: Secondary | ICD-10-CM

## 2019-11-06 DIAGNOSIS — E785 Hyperlipidemia, unspecified: Secondary | ICD-10-CM

## 2019-11-07 LAB — CBC WITH DIFFERENTIAL/PLATELET
Absolute Monocytes: 663 cells/uL (ref 200–950)
Basophils Absolute: 78 cells/uL (ref 0–200)
Basophils Relative: 1 %
Eosinophils Absolute: 187 cells/uL (ref 15–500)
Eosinophils Relative: 2.4 %
HCT: 44.7 % (ref 38.5–50.0)
Hemoglobin: 14.8 g/dL (ref 13.2–17.1)
Lymphs Abs: 2083 cells/uL (ref 850–3900)
MCH: 29.1 pg (ref 27.0–33.0)
MCHC: 33.1 g/dL (ref 32.0–36.0)
MCV: 88 fL (ref 80.0–100.0)
MPV: 9.4 fL (ref 7.5–12.5)
Monocytes Relative: 8.5 %
Neutro Abs: 4789 cells/uL (ref 1500–7800)
Neutrophils Relative %: 61.4 %
Platelets: 211 10*3/uL (ref 140–400)
RBC: 5.08 10*6/uL (ref 4.20–5.80)
RDW: 12.3 % (ref 11.0–15.0)
Total Lymphocyte: 26.7 %
WBC: 7.8 10*3/uL (ref 3.8–10.8)

## 2019-11-07 LAB — LIPID PANEL
Cholesterol: 127 mg/dL (ref <200)
HDL: 48 mg/dL (ref >=40)
LDL Cholesterol (Calc): 65 mg/dL (calc)
Non-HDL Cholesterol (Calc): 79 mg/dL (calc) (ref <130)
Total CHOL/HDL Ratio: 2.6 (calc) (ref <5.0)
Triglycerides: 61 mg/dL (ref <150)

## 2019-11-07 LAB — COMPREHENSIVE METABOLIC PANEL
AG Ratio: 2 (calc) (ref 1.0–2.5)
ALT: 17 U/L (ref 9–46)
AST: 15 U/L (ref 10–35)
Albumin: 4.3 g/dL (ref 3.6–5.1)
Alkaline phosphatase (APISO): 53 U/L (ref 35–144)
BUN: 15 mg/dL (ref 7–25)
CO2: 26 mmol/L (ref 20–32)
Calcium: 9.2 mg/dL (ref 8.6–10.3)
Chloride: 100 mmol/L (ref 98–110)
Creat: 0.88 mg/dL (ref 0.70–1.25)
Globulin: 2.2 g/dL (calc) (ref 1.9–3.7)
Glucose, Bld: 136 mg/dL — ABNORMAL HIGH (ref 65–99)
Potassium: 4.5 mmol/L (ref 3.5–5.3)
Sodium: 137 mmol/L (ref 135–146)
Total Bilirubin: 0.6 mg/dL (ref 0.2–1.2)
Total Protein: 6.5 g/dL (ref 6.1–8.1)

## 2019-11-07 LAB — HEMOGLOBIN A1C
Hgb A1c MFr Bld: 6.5 % of total Hgb — ABNORMAL HIGH (ref <5.7)
Mean Plasma Glucose: 140 (calc)
eAG (mmol/L): 7.7 (calc)

## 2019-11-08 ENCOUNTER — Ambulatory Visit: Payer: Medicare Other | Admitting: Family Medicine

## 2019-11-08 ENCOUNTER — Other Ambulatory Visit: Payer: Self-pay | Admitting: Family Medicine

## 2019-11-13 ENCOUNTER — Ambulatory Visit (INDEPENDENT_AMBULATORY_CARE_PROVIDER_SITE_OTHER): Payer: Medicare Other | Admitting: Family Medicine

## 2019-11-13 ENCOUNTER — Other Ambulatory Visit: Payer: Self-pay

## 2019-11-13 VITALS — BP 122/80 | HR 78 | Temp 98.6°F | Ht 68.0 in | Wt 249.0 lb

## 2019-11-13 DIAGNOSIS — E785 Hyperlipidemia, unspecified: Secondary | ICD-10-CM

## 2019-11-13 DIAGNOSIS — Z9861 Coronary angioplasty status: Secondary | ICD-10-CM

## 2019-11-13 DIAGNOSIS — E118 Type 2 diabetes mellitus with unspecified complications: Secondary | ICD-10-CM | POA: Diagnosis not present

## 2019-11-13 DIAGNOSIS — I10 Essential (primary) hypertension: Secondary | ICD-10-CM

## 2019-11-13 DIAGNOSIS — I2510 Atherosclerotic heart disease of native coronary artery without angina pectoris: Secondary | ICD-10-CM

## 2019-11-13 DIAGNOSIS — R0989 Other specified symptoms and signs involving the circulatory and respiratory systems: Secondary | ICD-10-CM

## 2019-11-13 MED ORDER — AMOXICILLIN 875 MG PO TABS
875.0000 mg | ORAL_TABLET | Freq: Two times a day (BID) | ORAL | 0 refills | Status: DC
Start: 2019-11-13 — End: 2020-02-28

## 2019-11-13 NOTE — Progress Notes (Signed)
Subjective:    Patient ID: Lee [name], male    DOB: 12-27-1950, 69 y.o.   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  During the work-up for this his hemoglobin A1c was found to be elevated at 9.1.  I had seen the patient earlier in the year in May and his hemoglobin A1c was elevated at 7.6.  We attempted to change lifestyle and diet however he has been unsuccessful in managing his diabetes without medication and now is clearly out of control.  He is here today to discuss treatment options.  At that time, my plan was: Begin Metformin 500 mg p.o. daily and gradually uptitrate over the next 2 to 3 weeks 2000 mg twice daily.  Limit carbohydrates to 45 g of carbs per meal.  Encourage 30 minutes a day 5 days a week of aerobic exercise and try to lose 15 to 20 pounds.  I would recheck hemoglobin A1c in 3 months and if still elevated at that point I would likely add Jardiance.  Patient will check to see if his insurance will cover Jardiance initially because I do believe this medication would be better given his underlying cardiovascular issues however I am certain that he will be able to afford Metformin.  If the Vania Rea is reasonably priced we will discontinue Metformin and replace with Jardiance 25 mg a day.  11/13/19 Wt Readings from Last 3 Encounters:  11/13/19 249 lb (112.9 kg)  09/07/19 248 lb (112.5 kg)  08/14/19 253 lb 6.4 oz (114.9 kg)   No visits with results within 1 Week(s) from this visit.  Latest known visit with results is:  Lab on 11/06/2019  Component Date Value Ref Range Status  . Hgb A1c MFr Bld 11/06/2019 6.5* <5.7 % of total Hgb Final   Comment: For someone without known diabetes, a hemoglobin A1c value of 6.5% or greater indicates that they may have  diabetes and this should be confirmed with a follow-up  test. . For someone with known diabetes, a value <7%  indicates  that their diabetes is well controlled and a value  greater than or equal to 7% indicates suboptimal  control. A1c targets should be individualized based on  duration of diabetes, age, comorbid conditions, and  other considerations. . Currently, no consensus exists regarding use of hemoglobin A1c for diagnosis of diabetes for children. .   . Mean Plasma Glucose 11/06/2019 140  (calc) Final  . eAG (mmol/L) 11/06/2019 7.7  (calc) Final  . Glucose, Bld 11/06/2019 136* 65 - 99 mg/dL Final   Comment: .            Fasting reference interval . For someone without known diabetes, a glucose value >125 mg/dL indicates that they may have diabetes and this should be confirmed with a follow-up test. .   . BUN 11/06/2019 15  7 - 25 mg/dL Final  . Creat 11/06/2019 0.88  0.70 - 1.25 mg/dL Final   Comment: For patients >57 years of age, the reference limit for Creatinine is approximately 13% higher for people identified as African-American. .   Havery Moros Ratio 64/33/2951 NOT APPLICABLE  6 - 22 (calc) Final  . Sodium 11/06/2019 137  135 - 146 mmol/L Final  . Potassium 11/06/2019 4.5  3.5 - 5.3 mmol/L Final  . Chloride 11/06/2019 100  98 - 110 mmol/L Final  . CO2 11/06/2019  26  20 - 32 mmol/L Final  . Calcium 11/06/2019 9.2  8.6 - 10.3 mg/dL Final  . Total Protein 11/06/2019 6.5  6.1 - 8.1 g/dL Final  . Albumin 11/06/2019 4.3  3.6 - 5.1 g/dL Final  . Globulin 11/06/2019 2.2  1.9 - 3.7 g/dL (calc) Final  . AG Ratio 11/06/2019 2.0  1.0 - 2.5 (calc) Final  . Total Bilirubin 11/06/2019 0.6  0.2 - 1.2 mg/dL Final  . Alkaline phosphatase (APISO) 11/06/2019 53  35 - 144 U/L Final  . AST 11/06/2019 15  10 - 35 U/L Final  . ALT 11/06/2019 17  9 - 46 U/L Final  . Cholesterol 11/06/2019 127  <200 mg/dL Final  . HDL 11/06/2019 48  > OR = 40 mg/dL Final  . Triglycerides 11/06/2019 61  <150 mg/dL Final  . LDL Cholesterol (Calc) 11/06/2019 65  mg/dL (calc) Final   Comment: Reference  range: <100 . Desirable range <100 mg/dL for primary prevention;   <70 mg/dL for patients with CHD or diabetic patients  with > or = 2 CHD risk factors. Marland Kitchen LDL-C is now calculated using the Martin-Hopkins  calculation, which is a validated novel method providing  better accuracy than the Friedewald equation in the  estimation of LDL-C.  Cresenciano Genre et al. Annamaria Helling. 2952;841(32): 2061-2068  (http://education.[facility].com/faq/FAQ164)   . Total CHOL/HDL Ratio 11/06/2019 2.6  <5.0 (calc) Final  . Non-HDL Cholesterol (Calc) 11/06/2019 79  <130 mg/dL (calc) Final   Comment: For patients with diabetes plus 1 major ASCVD risk  factor, treating to a non-HDL-C goal of <100 mg/dL  (LDL-C of <70 mg/dL) is considered a therapeutic  option.   . WBC 11/06/2019 7.8  3.8 - 10.8 Thousand/uL Final  . RBC 11/06/2019 5.08  4.20 - 5.80 Million/uL Final  . Hemoglobin 11/06/2019 14.8  13.2 - 17.1 g/dL Final  . HCT 11/06/2019 44.7  38 - 50 % Final  . MCV 11/06/2019 88.0  80.0 - 100.0 fL Final  . MCH 11/06/2019 29.1  27.0 - 33.0 pg Final  . MCHC 11/06/2019 33.1  32.0 - 36.0 g/dL Final  . RDW 11/06/2019 12.3  11.0 - 15.0 % Final  . Platelets 11/06/2019 211  140 - 400 Thousand/uL Final  . MPV 11/06/2019 9.4  7.5 - 12.5 fL Final  . Neutro Abs 11/06/2019 4,789  1,500 - 7,800 cells/uL Final  . Lymphs Abs 11/06/2019 2,083  850 - 3,900 cells/uL Final  . Absolute Monocytes 11/06/2019 663  200 - 950 cells/uL Final  . Eosinophils Absolute 11/06/2019 187  15 - 500 cells/uL Final  . Basophils Absolute 11/06/2019 78  0 - 200 cells/uL Final  . Neutrophils Relative % 11/06/2019 61.4  % Final  . Total Lymphocyte 11/06/2019 26.7  % Final  . Monocytes Relative 11/06/2019 8.5  % Final  . Eosinophils Relative 11/06/2019 2.4  % Final  . Basophils Relative 11/06/2019 1.0  % Final   Patient is here today for a follow-up of his diabetes.  However he also complains of pain in his sinuses that have been present for the last 2  weeks.  He is taking Flonase and Allegra without relief.  He reports constant throbbing pressure-like pain in his cheekbones.  He reports a dull constant pressure-like headache that has been persistent for 2 weeks.  He states this is different than his migraines which usually occur in his occiput.  He is taking Aimovig for his migraines and he typically has to use oxycodone for his  migraines 3 or 4 times a month.  Therefore I believe his migraines are relatively well controlled.  He denies any chest pain.  He denies any shortness of breath.  He denies any dyspnea on exertion.  On diabetic foot exam he has pedal pulses bilaterally however they are diminished.  He also has some purplish discoloration at the tips of his toes suggesting possible peripheral vascular disease however he denies any claudication.  He denies any polyuria, polydipsia, blurry vision.  He denies any yeast infections in his perineum.  He denies any neuropathy in his feet.  Past Medical History:  Diagnosis Date  . Allergic rhinitis    chronic sinusitis  . Arthritis   . CAD (coronary artery disease)    a. 2009 PCI/DES to mLAD; b. 05/2010 s/p DES RCA and LAD.; c. 05/2013 Cath: LAD mild ISR, LCX nl, RCA 40p, patent stents.  . Cataract    has had lasik surg  . COPD (chronic obstructive pulmonary disease) ([place])   . Diabetes mellitus type 2, controlled, without complications ([place])   . Diverticulosis   . DJD (degenerative joint disease)   . Esophagitis 1991   grade 1  . GERD (gastroesophageal reflux disease)    Hiatal hernia  . Hemorrhoids   . Hyperlipidemia   . Hypertensive heart disease   . Iron deficiency anemia   . Migraines   . Paroxysmal atrial fibrillation (HCC)    a. s/p afib ablation 10/22/08 ([facility]).  . PVC's (premature ventricular contractions)   . Sleep apnea    Questionalble: RDI during the total sleep time 6h 28 mins was 3.55/hr during REM sleep was at 5.71/hr. Supine AHI was 5.93/hr.  . Syncope 08/2018    Past Surgical History:  Procedure Laterality Date  . CARDIAC CATHETERIZATION  05/2010   The proximal LAD was then predilated with 2.0 x 12 trek. this was then stented with a 2.5 x 16 promus Element drug-eluting stent at 14 atmosphere and prostdilated with 2.75 x 12 [place] trek at 16 atmospheres(2.8 mm) resulting the reduction of 80% proximal LAD stenosis to 0% residual with excellent flow.  Marland Kitchen CARDIAC CATHETERIZATION  06/05/2010   Successful percutaneous coronary intervention to the right coronary artery with percutaneous transluminal coronary angioplasty/stenting and insertion 3.0 x 16 mm Promus DES post dilated to 3.35 mm with stenosis being reduced to 0%  . CARDIAC CATHETERIZATION  02/2008   Post dilatation was performed using a 2.75 x 9 [place] sprinter, 10 atmospheres for 40 seconds and then 9 atmosphere for 35 sec. This resulted in the 80% area of narrowing pre-intervention, now appearing to normal. There was no edvidence of the dissection or thrombus and there was TIMI III flow pre and post.  . CARDIAC CATHETERIZATION  02/2006  . CORONARY ANGIOPLASTY WITH STENT PLACEMENT     3 stents/ 4 caths  . EYE SURGERY    . KNEE ARTHROSCOPY     right  . LASIK    . LEFT HEART CATHETERIZATION WITH CORONARY ANGIOGRAM N/A 06/29/2013   Procedure: LEFT HEART CATHETERIZATION WITH CORONARY ANGIOGRAM;  Surgeon: Leonie Man, MD;  Location: [facility] CATH LAB;  Service: Cardiovascular;  Laterality: N/A;  . NASAL SINUS SURGERY  2001  . NECK SURGERY     Cervical fusion  . PACEMAKER IMPLANT N/A 05/01/2019   Procedure: PACEMAKER IMPLANT;  Surgeon: [name], MD;  Location: [facility] CV LAB;  Service: Cardiovascular;  Laterality: N/A;  . RADIOFREQUENCY ABLATION  May 2010   atrial fibrillation  .  ROTATOR CUFF REPAIR     left  . SHOULDER OPEN ROTATOR CUFF REPAIR     right  . SPINE SURGERY    . WRIST ARTHROCENTESIS     left   Current Outpatient Medications on File Prior to Visit  Medication Sig Dispense Refill   . Accu-Chek FastClix Lancets MISC USE TO CHECK BLOOD SUGAR TWICE DAILY 102 each 3  . acetaminophen (TYLENOL) 325 MG tablet Take 1-2 tablets (325-650 mg total) by mouth every 4 (four) hours as needed for mild pain.    Marland Kitchen amLODipine (NORVASC) 10 MG tablet TAKE 1 TABLET(10 MG) BY MOUTH DAILY 90 tablet 3  . aspirin EC 81 MG tablet Take 81 mg by mouth at bedtime.    . Blood Glucose Monitoring Suppl (ACCU-CHEK AVIVA PLUS) w/Device KIT Check BS bid E11.9 1 kit 1  . cholecalciferol (VITAMIN D) 1000 UNITS tablet Take 1,000 Units by mouth at bedtime.     . citalopram (CELEXA) 40 MG tablet TAKE 1 TABLET(40 MG) BY MOUTH DAILY 90 tablet 1  . Coenzyme Q10 (CO Q 10) 100 MG CAPS Take 100 mg by mouth daily with breakfast.     . Erenumab-aooe (AIMOVIG) 140 MG/ML SOAJ Inject 140 mg into the skin every 30 (thirty) days. On or about the 1st of each month    . fexofenadine (ALLEGRA) 180 MG tablet Take 180 mg by mouth daily with breakfast.     . fluticasone (FLONASE) 50 MCG/ACT nasal spray INSTILL 2 SPRAYS IN THE  NOSE AT BEDTIME 48 g 3  . furosemide (LASIX) 40 MG tablet TAKE 1 TABLET BY MOUTH  DAILY 90 tablet 3  . glucose blood (ACCU-CHEK AVIVA PLUS) test strip Check BS bid E11.9 100 each 12  . JARDIANCE 25 MG TABS tablet TAKE 1 TABLET BY MOUTH DAILY BEFORE BREAKFAST 30 tablet 5  . Lancets Misc. (ACCU-CHEK FASTCLIX LANCET) KIT Check BS bid E11.9 1 kit 1  . Multiple Vitamin (MULTIVITAMIN WITH MINERALS) TABS tablet Take 1 tablet by mouth daily with breakfast.    . nitroGLYCERIN (NITROSTAT) 0.4 MG SL tablet PLACE ONE TABLET UNDER THE TONGUE EVERY 5 MINUTES AS NEEDED FOR CHEST PAIN (Patient taking differently: Place 0.4 mg under the tongue every 5 (five) minutes as needed for chest pain. ) 25 tablet 3  . oxyCODONE-acetaminophen (PERCOCET) 10-325 MG tablet Take 1 tablet by mouth every 8 (eight) hours as needed for pain. 21 tablet 0  . pantoprazole (PROTONIX) 40 MG tablet TAKE 1 TABLET BY MOUTH  EVERY DAY 90 tablet 3  .  Polyvinyl Alcohol-Povidone (REFRESH OP) Place 1 drop into both eyes at bedtime as needed (dry eyes).    . potassium chloride (MICRO-K) 10 MEQ CR capsule TAKE 1 CAPSULE BY MOUTH  EVERY DAY 90 capsule 3  . rosuvastatin (CRESTOR) 40 MG tablet TAKE 1 TABLET BY MOUTH  DAILY 90 tablet 3  . telmisartan (MICARDIS) 40 MG tablet TAKE 1 TABLET BY MOUTH  DAILY 90 tablet 3  . Ubrogepant (UBRELVY) 50 MG TABS Take 50 mg by mouth daily as needed (at onset of migraine headache).    . zolpidem (AMBIEN) 5 MG tablet Take 1 tablet (5 mg total) by mouth at bedtime as needed for sleep. 30 tablet 0   No current facility-administered medications on file prior to visit.   Allergies  Allergen Reactions  . Ibuprofen Swelling    Whole body swelled  . Other Shortness Of Breath    BETA BLOCKERS  . Topamax [Topiramate] Other (See  Comments)    Pt states it made his tongue feel "scalded" and he couldn't eat   . Toprol Xl [Metoprolol Succinate] Other (See Comments)    Personality change  . Metoprolol-Hctz Er Other (See Comments)    Indigestion,mouth raw   Social History   Socioeconomic History  . Marital status: Married    Spouse name: Not on file  . Number of children: 2  . Years of education: Not on file  . Highest education level: Not on file  Occupational History  . Occupation: retired  Tobacco Use  . Smoking status: Former Smoker    Quit date: 06/01/1987    Years since quitting: 32.4  . Smokeless tobacco: Never Used  Vaping Use  . Vaping Use: Never used  Substance and Sexual Activity  . Alcohol use: Yes    Alcohol/week: 0.0 standard drinks    Comment: once every 6-7 months  . Drug use: No  . Sexual activity: Not on file  Other Topics Concern  . Not on file  Social History Narrative  . Not on file   Social Determinants of Health   Financial Resource Strain:   . Difficulty of Paying Living Expenses:   Food Insecurity:   . Worried About Charity fundraiser in the Last Year:   . Arboriculturist  in the Last Year:   Transportation Needs:   . Film/video editor (Medical):   Marland Kitchen Lack of Transportation (Non-Medical):   Physical Activity:   . Days of Exercise per Week:   . Minutes of Exercise per Session:   Stress:   . Feeling of Stress :   Social Connections:   . Frequency of Communication with Friends and Family:   . Frequency of Social Gatherings with Friends and Family:   . Attends Religious Services:   . Active Member of Clubs or Organizations:   . Attends Archivist Meetings:   Marland Kitchen Marital Status:   Intimate Partner Violence:   . Fear of Current or Ex-Partner:   . Emotionally Abused:   Marland Kitchen Physically Abused:   . Sexually Abused:       Review of Systems  All other systems reviewed and are negative.      Objective:   Physical Exam Vitals reviewed.  Constitutional:      General: He is not in acute distress.    Appearance: He is not ill-appearing, toxic-appearing or diaphoretic.  Cardiovascular:     Rate and Rhythm: Normal rate.     Heart sounds: Normal heart sounds.  Pulmonary:     Effort: Pulmonary effort is normal. No tachypnea or respiratory distress.     Breath sounds: No stridor.  Musculoskeletal:     Right lower leg: No edema.     Left lower leg: No edema.  Neurological:     Mental Status: He is alert.           Assessment & Plan:  Decreased pedal pulses - Plan: VAS [place] ABI WITH/WO TBI  Type 2 diabetes mellitus with complication, without long-term current use of insulin (HCC) - Plan: Microalbumin, urine, CANCELED: CBC with Differential/Platelet, CANCELED: COMPLETE METABOLIC PANEL WITH GFR, CANCELED: Lipid panel, CANCELED: Hemoglobin A1c, CANCELED: Ambulatory referral to Ophthalmology  CAD S/P percutaneous coronary angioplasty - Plan: Microalbumin, urine, CANCELED: CBC with Differential/Platelet, CANCELED: COMPLETE METABOLIC PANEL WITH GFR, CANCELED: Lipid panel, CANCELED: Hemoglobin A1c  Essential hypertension - Plan: Microalbumin,  urine, CANCELED: CBC with Differential/Platelet, CANCELED: COMPLETE METABOLIC PANEL WITH GFR, CANCELED: Lipid  panel, CANCELED: Hemoglobin A1c  Hyperlipidemia LDL goal <70 - Plan: Microalbumin, urine, CANCELED: CBC with Differential/Platelet, CANCELED: COMPLETE METABOLIC PANEL WITH GFR, CANCELED: Lipid panel, CANCELED: Hemoglobin A1c  Patient has had a diabetic eye exam.  I will get his records from his ophthalmologist.  I will check a urine microalbumin.  His LDL cholesterol is below 70 which is at goal.  His A1c is 6.5 which is excellent.  I congratulated the patient is on his performance.  Blood pressure today is excellent.  I am concerned by his decreased pedal pulses.  Therefore I will perform ABIs to evaluate for peripheral vascular disease.  The remainder of his lab work is excellent.  I do believe the patient has a sinus infection and therefore I will treat him with amoxicillin 875 mg twice daily for 10 days.

## 2019-11-14 LAB — MICROALBUMIN, URINE: Microalb, Ur: 0.2 mg/dL

## 2019-11-16 ENCOUNTER — Other Ambulatory Visit: Payer: Self-pay

## 2019-11-16 ENCOUNTER — Ambulatory Visit (HOSPITAL_COMMUNITY)
Admission: RE | Admit: 2019-11-16 | Discharge: 2019-11-16 | Disposition: A | Discharge status: Home / Self Care | Payer: Medicare Other | Source: Ambulatory Visit | Attending: Family Medicine | Admitting: Family Medicine

## 2019-11-16 DIAGNOSIS — R0989 Other specified symptoms and signs involving the circulatory and respiratory systems: Secondary | ICD-10-CM | POA: Diagnosis not present

## 2019-12-07 ENCOUNTER — Other Ambulatory Visit: Payer: Self-pay | Admitting: Cardiovascular Disease

## 2019-12-10 ENCOUNTER — Other Ambulatory Visit: Payer: Self-pay

## 2019-12-11 ENCOUNTER — Other Ambulatory Visit: Payer: Self-pay

## 2019-12-11 MED ORDER — ZOLPIDEM TARTRATE 5 MG PO TABS: 5.0000 mg | ORAL_TABLET | Freq: Every evening | ORAL | 3 refills | Status: DC | PRN

## 2019-12-11 NOTE — Telephone Encounter (Signed)
Called in verbal prescription to [facility] on [address] and [address]. Zolpidem (ambien) 5mg - take 1 tab by mouth at bedtime as needed for sleep. 30 tabs with 3 refills. Verbal confirmation with readback given from pharmacy tech.

## 2019-12-24 ENCOUNTER — Other Ambulatory Visit: Payer: Self-pay

## 2019-12-24 MED ORDER — OXYCODONE-ACETAMINOPHEN 10-325 MG PO TABS: 1.0000 | ORAL_TABLET | Freq: Three times a day (TID) | ORAL | 0 refills | Status: DC | PRN

## 2019-12-24 NOTE — Telephone Encounter (Signed)
Requested Prescriptions   Pending Prescriptions Disp Refills  . oxyCODONE-acetaminophen (PERCOCET) 10-325 MG tablet 21 tablet 0    Sig: Take 1 tablet by mouth every 8 (eight) hours as needed for pain.     Last OV 11/13/2019   Last written 09/03/2019

## 2019-12-25 ENCOUNTER — Ambulatory Visit (INDEPENDENT_AMBULATORY_CARE_PROVIDER_SITE_OTHER): Payer: Medicare Other

## 2019-12-25 ENCOUNTER — Ambulatory Visit (INDEPENDENT_AMBULATORY_CARE_PROVIDER_SITE_OTHER): Payer: Medicare Other | Admitting: Podiatry

## 2019-12-25 ENCOUNTER — Other Ambulatory Visit: Payer: Self-pay

## 2019-12-25 ENCOUNTER — Encounter: Payer: Self-pay | Admitting: Podiatry

## 2019-12-25 DIAGNOSIS — M7750 Other enthesopathy of unspecified foot: Secondary | ICD-10-CM | POA: Diagnosis not present

## 2019-12-25 DIAGNOSIS — M7751 Other enthesopathy of right foot: Secondary | ICD-10-CM | POA: Diagnosis not present

## 2019-12-25 DIAGNOSIS — Q828 Other specified congenital malformations of skin: Secondary | ICD-10-CM

## 2019-12-26 NOTE — Progress Notes (Signed)
He presents today with a concern of pain along the fifth metatarsal base of the right foot.  States that is swollen and red for about a month denies any injury.  States that he takes pain meds as needed for it.  He would also like for me to trim the calluses plantar aspect of the forefoot bilateral.  Objective: Vital signs are stable he is alert and oriented x3 there is no erythema edema cellulitis drainage or odor he has pain on palpation of the fifth met base where there is fluctuance no purulence no open lesions or wounds no drainage.  He also has tenderness on palpation of the insertion of the peroneal brevis tendon with some tenderness on abduction against resistance.  Radiographs taken today do not demonstrate any type of osseous abnormalities in this area.  Assessment: Porokeratosis subfifth bilateral bursitis subfifth met base right foot and peroneal tendinitis right foot.  Plan: Discussed etiology pathology conservative surgical therapies at this point I injected the peroneal tendon area making sure not to inject directly into the tendon with 2 mg of dexamethasone and local anesthetic also injected the bursa today subfifth with dexamethasone again 2 mg was utilized.  I also debrided all reactive hyperkeratoses discussed appropriate shoe gear and with the shoes making sure that his shoes are not rubbing that area.  I will follow-up with him on an as-needed basis.

## 2020-01-29 ENCOUNTER — Ambulatory Visit (INDEPENDENT_AMBULATORY_CARE_PROVIDER_SITE_OTHER): Payer: Medicare Other | Admitting: *Deleted

## 2020-01-29 DIAGNOSIS — I441 Atrioventricular block, second degree: Secondary | ICD-10-CM

## 2020-01-29 LAB — CUP PACEART REMOTE DEVICE CHECK
Battery Remaining Longevity: 105 mo
Battery Remaining Percentage: 95.5 %
Battery Voltage: 3.01 V
Brady Statistic AP VP Percent: 1 %
Brady Statistic AP VS Percent: 1 %
Brady Statistic AS VP Percent: 85 %
Brady Statistic AS VS Percent: 14 %
Brady Statistic RA Percent Paced: 1 %
Brady Statistic RV Percent Paced: 86 %
Date Time Interrogation Session: 20210831020013
Implantable Lead Implant Date: 20201201
Implantable Lead Implant Date: 20201201
Implantable Lead Location: 753859
Implantable Lead Location: 753860
Implantable Pulse Generator Implant Date: 20201201
Lead Channel Impedance Value: 480 Ohm
Lead Channel Impedance Value: 550 Ohm
Lead Channel Pacing Threshold Amplitude: 0.75 V
Lead Channel Pacing Threshold Amplitude: 0.75 V
Lead Channel Pacing Threshold Pulse Width: 0.5 ms
Lead Channel Pacing Threshold Pulse Width: 0.5 ms
Lead Channel Sensing Intrinsic Amplitude: 3.5 mV
Lead Channel Sensing Intrinsic Amplitude: 5 mV
Lead Channel Setting Pacing Amplitude: 2 V
Lead Channel Setting Pacing Amplitude: 2.5 V
Lead Channel Setting Pacing Pulse Width: 0.5 ms
Lead Channel Setting Sensing Sensitivity: 2 mV
Pulse Gen Model: 2272
Pulse Gen Serial Number: 9182765

## 2020-01-30 NOTE — Progress Notes (Signed)
Remote pacemaker transmission.   

## 2020-02-05 ENCOUNTER — Other Ambulatory Visit: Payer: Self-pay | Admitting: Cardiovascular Disease

## 2020-02-11 ENCOUNTER — Other Ambulatory Visit: Payer: Self-pay | Admitting: Cardiovascular Disease

## 2020-02-12 DIAGNOSIS — M25561 Pain in right knee: Secondary | ICD-10-CM | POA: Insufficient documentation

## 2020-02-28 ENCOUNTER — Encounter: Payer: Self-pay | Admitting: Physician Assistant

## 2020-02-28 ENCOUNTER — Telehealth (INDEPENDENT_AMBULATORY_CARE_PROVIDER_SITE_OTHER): Payer: Medicare Other | Admitting: Physician Assistant

## 2020-02-28 ENCOUNTER — Telehealth: Payer: Self-pay

## 2020-02-28 VITALS — BP 127/74 | HR 79 | Wt 245.0 lb

## 2020-02-28 DIAGNOSIS — I480 Paroxysmal atrial fibrillation: Secondary | ICD-10-CM

## 2020-02-28 DIAGNOSIS — Z950 Presence of cardiac pacemaker: Secondary | ICD-10-CM

## 2020-02-28 DIAGNOSIS — R0600 Dyspnea, unspecified: Secondary | ICD-10-CM | POA: Diagnosis not present

## 2020-02-28 DIAGNOSIS — I25119 Atherosclerotic heart disease of native coronary artery with unspecified angina pectoris: Secondary | ICD-10-CM | POA: Diagnosis not present

## 2020-02-28 DIAGNOSIS — J449 Chronic obstructive pulmonary disease, unspecified: Secondary | ICD-10-CM

## 2020-02-28 DIAGNOSIS — R079 Chest pain, unspecified: Secondary | ICD-10-CM | POA: Diagnosis not present

## 2020-02-28 DIAGNOSIS — E119 Type 2 diabetes mellitus without complications: Secondary | ICD-10-CM

## 2020-02-28 DIAGNOSIS — R0609 Other forms of dyspnea: Secondary | ICD-10-CM

## 2020-02-28 DIAGNOSIS — E785 Hyperlipidemia, unspecified: Secondary | ICD-10-CM

## 2020-02-28 DIAGNOSIS — I10 Essential (primary) hypertension: Secondary | ICD-10-CM

## 2020-02-28 NOTE — Telephone Encounter (Signed)
  Patient Consent for Virtual Visit          Lee [name] has provided verbal consent on 02/28/2020 for a virtual visit (video or telephone).   CONSENT FOR VIRTUAL VISIT FOR:  Lee [name]  By participating in this virtual visit I agree to the following:  I hereby voluntarily request, consent and authorize [facility] and its employed or contracted physicians, physician assistants, nurse practitioners or other licensed health care professionals (the Practitioner), to provide me with telemedicine health care services (the "Services") as deemed necessary by the treating Practitioner. I acknowledge and consent to receive the Services by the Practitioner via telemedicine. I understand that the telemedicine visit will involve communicating with the Practitioner through live audiovisual communication technology and the disclosure of certain medical information by electronic transmission. I acknowledge that I have been given the opportunity to request an in-person assessment or other available alternative prior to the telemedicine visit and am voluntarily participating in the telemedicine visit.  I understand that I have the right to withhold or withdraw my consent to the use of telemedicine in the course of my care at any time, without affecting my right to future care or treatment, and that the Practitioner or I may terminate the telemedicine visit at any time. I understand that I have the right to inspect all information obtained and/or recorded in the course of the telemedicine visit and may receive copies of available information for a reasonable fee.  I understand that some of the potential risks of receiving the Services via telemedicine include:  Marland Kitchen Delay or interruption in medical evaluation due to technological equipment failure or disruption; . Information transmitted may not be sufficient (e.g. poor resolution of images) to allow for appropriate medical decision making by the  Practitioner; and/or  . In rare instances, security protocols could fail, causing a breach of personal health information.  Furthermore, I acknowledge that it is my responsibility to provide information about my medical history, conditions and care that is complete and accurate to the best of my ability. I acknowledge that Practitioner's advice, recommendations, and/or decision may be based on factors not within their control, such as incomplete or inaccurate data provided by me or distortions of diagnostic images or specimens that may result from electronic transmissions. I understand that the practice of medicine is not an exact science and that Practitioner makes no warranties or guarantees regarding treatment outcomes. I acknowledge that a copy of this consent can be made available to me via my patient portal ([place]), or I can request a printed copy by calling the office of [facility].    I understand that my insurance will be billed for this visit.   I have read or had this consent read to me. . I understand the contents of this consent, which adequately explains the benefits and risks of the Services being provided via telemedicine.  . I have been provided ample opportunity to ask questions regarding this consent and the Services and have had my questions answered to my satisfaction. . I give my informed consent for the services to be provided through the use of telemedicine in my medical care

## 2020-02-28 NOTE — Patient Instructions (Signed)
Medication Instructions:  No changes *If you need a refill on your cardiac medications before your next appointment, please call your pharmacy*   Lab Work: None ordered If you have labs (blood work) drawn today and your tests are completely normal, you will receive your results only by: Marland Kitchen MyChart Message (if you have MyChart) OR . A paper copy in the mail If you have any lab test that is abnormal or we need to change your treatment, we will call you to review the results.   Testing/Procedures: Your physician has requested that you have a lexiscan myoview. For further information please visit [email or web address]. Please follow instruction sheet, as given.    Follow-Up: At [facility], you and your health needs are our priority.  As part of our continuing mission to provide you with exceptional heart care, we have created designated Provider Care Teams.  These Care Teams include your primary Cardiologist (physician) and Advanced Practice Providers (APPs -  Physician Assistants and Nurse Practitioners) who all work together to provide you with the care you need, when you need it.  We recommend signing up for the patient portal called "MyChart".  Sign up information is provided on this After Visit Summary.  MyChart is used to connect with patients for Virtual Visits (Telemedicine).  Patients are able to view lab/test results, encounter notes, upcoming appointments, etc.  Non-urgent messages can be sent to your provider as well.   To learn more about what you can do with MyChart, go to [email or web address].    Your next appointment:   4 week(s)  The format for your next appointment:   In Person  Provider:   Almyra Deforest, PA-C   Other Instructions You are scheduled for a Nurse Visit on 02/29/2020 at 1:15 p.m.

## 2020-02-28 NOTE — Progress Notes (Signed)
Virtual Visit via Telephone Note   This visit type was conducted due to national recommendations for restrictions regarding the COVID-19 Pandemic (e.g. social distancing) in an effort to limit this patient's exposure and mitigate transmission in our community.  Due to his co-morbid illnesses, this patient is at least at moderate risk for complications without adequate follow up.  This format is felt to be most appropriate for this patient at this time.  The patient did not have access to video technology/had technical difficulties with video requiring transitioning to audio format only (telephone).  All issues noted in this document were discussed and addressed.  No physical exam could be performed with this format.  Please refer to the patient's chart for his  consent to telehealth for [facility].    Date:  03/01/2020   ID:  Lee [name], DOB 1950/09/08, MRN [id number] The patient was identified using 2 identifiers.  Patient Location: Home Provider Location: Office/Clinic  PCP:  Susy Frizzle, MD  Cardiologist:  Shelva Majestic, MD  Electrophysiologist:  [name], MD   Evaluation Performed:  Follow-Up Visit  Chief Complaint:  Follow up  History of Present Illness:    Lee [name] is a 69 y.o. male with past medical history of CAD, COPD, hypertension, hyperlipidemia, DM 2, PAF s/p ablation 2010 (no longer on anticoagulation), symptomatic bradycardia s/p PPM and obstructive sleep apnea.  In 2009, he underwent stenting of mid LAD.  He underwent staged intervention to proximal RCA and proximal LAD in 2012.  Myoview in February 2014 showed normal perfusion and function.  He underwent repeat cardiac catheterization January 2015 for chest pain which did not demonstrate a significant restenosis in LAD and RCA stent.  Initially, he was placed on long-acting nitrate for antianginal purposes, this was later discontinued due to significant headache.  Myoview in April 2017 showed EF  63%, normal perfusion without scar or ischemia.  He was admitted to the hospital in April 2020 with syncope suspected to be vasovagal event.  He was also noted to have significant bradycardia, diltiazem was stopped. Echocardiogram obtained during the hospitalization showed EF 60 to 65%, grade 2 DD, no significant valve issue.  He was readmitted in May 2020 with recurrent syncope and found to have pauses on his heart monitor lasting up to 12 seconds.  EMS found him to be in complete heart block with heart rate in the 20s.  He was readmitted and carvedilol was discontinued.  He was evaluated by EP service as outpatient and eventually underwent Saint Jude dual-chamber pacemaker by Dr. Rayann Heman for symptomatic Mobitz 2 second-degree AV block on 05/01/2019.   Patient presents today for evaluation of dyspnea on exertion.  He has been noticing increasing dyspnea on exertion for the past several weeks.  He also noticed a sharp stabbing pain in the central chest that lasted a few seconds at night.  This chest discomfort does not occur with physical activity.  He is unable to tell me if he has any lower extremity edema.  I recommended a outpatient nursing visit with EKG and physical exam to rule out volume overload.  Otherwise his dyspnea on exertion is quite concerning even though chest pain is atypical, I recommended upcoming Myoview to rule out anginal equivalent.  Otherwise I plan to see the patient back in 3 to 4 weeks.  He denies any significant bleeding issue recently.  Last hemoglobin obtained in June 2021 was normal.  The patient does not have symptoms concerning for COVID-19  infection (fever, chills, cough, or new shortness of breath).    Past Medical History:  Diagnosis Date  . Allergic rhinitis    chronic sinusitis  . Arthritis   . CAD (coronary artery disease)    a. 2009 PCI/DES to mLAD; b. 05/2010 s/p DES RCA and LAD.; c. 05/2013 Cath: LAD mild ISR, LCX nl, RCA 40p, patent stents.  . Cataract    has  had lasik surg  . COPD (chronic obstructive pulmonary disease) ([place])   . Diabetes mellitus type 2, controlled, without complications ([place])   . Diverticulosis   . DJD (degenerative joint disease)   . Esophagitis 1991   grade 1  . GERD (gastroesophageal reflux disease)    Hiatal hernia  . Hemorrhoids   . Hyperlipidemia   . Hypertensive heart disease   . Iron deficiency anemia   . Migraines   . Paroxysmal atrial fibrillation (H[facility])    a. s/p afib ablation 10/22/08 ([facility]).  . PVC's (premature ventricular contractions)   . Sleep apnea    Questionalble: RDI during the total sleep time 6h 28 mins was 3.55/hr during REM sleep was at 5.71/hr. Supine AHI was 5.93/hr.  . Syncope 08/2018   Past Surgical History:  Procedure Laterality Date  . CARDIAC CATHETERIZATION  05/2010   The proximal LAD was then predilated with 2.0 x 12 trek. this was then stented with a 2.5 x 16 promus Element drug-eluting stent at 14 atmosphere and prostdilated with 2.75 x 12 Taliaferro trek at 16 atmospheres(2.8 mm) resulting the reduction of 80% proximal LAD stenosis to 0% residual with excellent flow.  Marland Kitchen CARDIAC CATHETERIZATION  06/05/2010   Successful percutaneous coronary intervention to the right coronary artery with percutaneous transluminal coronary angioplasty/stenting and insertion 3.0 x 16 mm Promus DES post dilated to 3.35 mm with stenosis being reduced to 0%  . CARDIAC CATHETERIZATION  02/2008   Post dilatation was performed using a 2.75 x 9 [place] sprinter, 10 atmospheres for 40 seconds and then 9 atmosphere for 35 sec. This resulted in the 80% area of narrowing pre-intervention, now appearing to normal. There was no edvidence of the dissection or thrombus and there was TIMI III flow pre and post.  . CARDIAC CATHETERIZATION  02/2006  . CORONARY ANGIOPLASTY WITH STENT PLACEMENT     3 stents/ 4 caths  . EYE SURGERY    . KNEE ARTHROSCOPY     right  . LASIK    . LEFT HEART CATHETERIZATION WITH CORONARY ANGIOGRAM N/A  06/29/2013   Procedure: LEFT HEART CATHETERIZATION WITH CORONARY ANGIOGRAM;  Surgeon: Leonie Man, MD;  Location: [facility] CATH LAB;  Service: Cardiovascular;  Laterality: N/A;  . NASAL SINUS SURGERY  2001  . NECK SURGERY     Cervical fusion  . PACEMAKER IMPLANT N/A 05/01/2019   Procedure: PACEMAKER IMPLANT;  Surgeon: [name], MD;  Location: [place] CV LAB;  Service: Cardiovascular;  Laterality: N/A;  . RADIOFREQUENCY ABLATION  May 2010   atrial fibrillation  . ROTATOR CUFF REPAIR     left  . SHOULDER OPEN ROTATOR CUFF REPAIR     right  . SPINE SURGERY    . WRIST ARTHROCENTESIS     left     Current Meds  Medication Sig  . Accu-Chek FastClix Lancets MISC USE TO CHECK BLOOD SUGAR TWICE DAILY  . acetaminophen (TYLENOL) 325 MG tablet Take 1-2 tablets (325-650 mg total) by mouth every 4 (four) hours as needed for mild pain.  Marland Kitchen amLODipine (NORVASC) 10  MG tablet TAKE 1 TABLET(10 MG) BY MOUTH DAILY  . aspirin EC 81 MG tablet Take 81 mg by mouth at bedtime.  . Blood Glucose Monitoring Suppl (ACCU-CHEK AVIVA PLUS) w/Device KIT Check BS bid E11.9  . cholecalciferol (VITAMIN D) 1000 UNITS tablet Take 1,000 Units by mouth at bedtime.   . citalopram (CELEXA) 40 MG tablet TAKE 1 TABLET(40 MG) BY MOUTH DAILY  . Coenzyme Q10 (CO Q 10) 100 MG CAPS Take 100 mg by mouth daily with breakfast.   . Erenumab-aooe (AIMOVIG) 140 MG/ML SOAJ Inject 140 mg into the skin every 30 (thirty) days. On or about the 1st of each month  . fexofenadine (ALLEGRA) 180 MG tablet Take 180 mg by mouth daily with breakfast.   . fluticasone (FLONASE) 50 MCG/ACT nasal spray INSTILL 2 SPRAYS IN THE  NOSE AT BEDTIME  . furosemide (LASIX) 40 MG tablet TAKE 1 TABLET BY MOUTH  DAILY  . glucose blood (ACCU-CHEK AVIVA PLUS) test strip Check BS bid E11.9  . JARDIANCE 25 MG TABS tablet TAKE 1 TABLET BY MOUTH DAILY BEFORE BREAKFAST  . Lancets Misc. (ACCU-CHEK FASTCLIX LANCET) KIT Check BS bid E11.9  . Multiple Vitamin (MULTIVITAMIN  WITH MINERALS) TABS tablet Take 1 tablet by mouth daily with breakfast.  . nitroGLYCERIN (NITROSTAT) 0.4 MG SL tablet PLACE ONE TABLET UNDER THE TONGUE EVERY 5 MINUTES AS NEEDED FOR CHEST PAIN  . oxyCODONE-acetaminophen (PERCOCET) 10-325 MG tablet Take 1 tablet by mouth every 8 (eight) hours as needed for pain.  . pantoprazole (PROTONIX) 40 MG tablet TAKE 1 TABLET BY MOUTH  DAILY  . Polyvinyl Alcohol-Povidone (REFRESH OP) Place 1 drop into both eyes at bedtime as needed (dry eyes).  . potassium chloride (MICRO-K) 10 MEQ CR capsule TAKE 1 CAPSULE BY MOUTH  DAILY  . rosuvastatin (CRESTOR) 40 MG tablet TAKE 1 TABLET BY MOUTH  DAILY  . telmisartan (MICARDIS) 40 MG tablet TAKE 1 TABLET BY MOUTH  DAILY  . zolpidem (AMBIEN) 5 MG tablet Take 1 tablet (5 mg total) by mouth at bedtime as needed for sleep.     Allergies:   Ibuprofen, Other, Topamax [topiramate], Toprol xl [metoprolol succinate], and Metoprolol-hctz er   Social History   Tobacco Use  . Smoking status: Former Smoker    Quit date: 06/01/1987    Years since quitting: 32.7  . Smokeless tobacco: Never Used  Vaping Use  . Vaping Use: Never used  Substance Use Topics  . Alcohol use: Yes    Alcohol/week: 0.0 standard drinks    Comment: once every 6-7 months  . Drug use: No     Family Hx: The patient's family history includes Colon cancer in his mother; Diabetes in his father; Heart disease in his father and sister; Uterine cancer in his mother.  ROS:   Please see the history of present illness.     All other systems reviewed and are negative.   Prior CV studies:   The following studies were reviewed today:  Echo 05/01/2019 1. Left ventricular ejection fraction, by visual estimation, is 60 to  65%. The left ventricle has normal function. Left ventricular septal wall  thickness was mildly increased. There is no left ventricular hypertrophy.  2. Definity contrast agent was given IV to delineate the left ventricular  endocardial  borders.  3. Left ventricular diastolic parameters are indeterminate.  4. Global right ventricle has normal systolic function.The right  ventricular size is normal. No increase in right ventricular wall  thickness.  5. Left  atrial size was moderately dilated.  6. Right atrial size was normal.  7. The mitral valve is normal in structure. Trace mitral valve  regurgitation. No evidence of mitral stenosis.  8. The tricuspid valve is normal in structure. Tricuspid valve  regurgitation is not demonstrated.  9. The aortic valve is tricuspid. Aortic valve regurgitation is not  visualized. Mild to moderate aortic valve sclerosis/calcification without  any evidence of aortic stenosis.  10. Small gradient across AV with no significant AS.  11. The pulmonic valve was normal in structure. Pulmonic valve  regurgitation is not visualized.  12. The inferior vena cava is normal in size with greater than 50%  respiratory variability, suggesting right atrial pressure of 3 mmHg.  13. The interatrial septum was not well visualized.   Labs/Other Tests and Data Reviewed:    EKG:  An ECG dated 09/07/2019 was personally reviewed today and demonstrated:  V paced rhythm.  Recent Labs: 04/30/2019: Magnesium 1.9 05/01/2019: TSH 1.200 11/06/2019: ALT 17; BUN 15; Creat 0.88; Hemoglobin 14.8; Platelets 211; Potassium 4.5; Sodium 137   Recent Lipid Panel Lab Results  Component Value Date/Time   CHOL 127 11/06/2019 08:52 AM   CHOL 155 11/21/2012 10:25 AM   TRIG 61 11/06/2019 08:52 AM   TRIG 81 11/21/2012 10:25 AM   HDL 48 11/06/2019 08:52 AM   HDL 45 11/21/2012 10:25 AM   CHOLHDL 2.6 11/06/2019 08:52 AM   LDLCALC 65 11/06/2019 08:52 AM   LDLCALC 94 11/21/2012 10:25 AM    Wt Readings from Last 3 Encounters:  02/28/20 245 lb (111.1 kg)  11/13/19 249 lb (112.9 kg)  09/07/19 248 lb (112.5 kg)     Objective:    Vital Signs:  BP 127/74   Pulse 79   Wt 245 lb (111.1 kg)   BMI 37.25 kg/m    VITAL  SIGNS:  reviewed  ASSESSMENT & PLAN:    1. Chest pain and dyspnea on exertion: Some his chest pain is atypical, however increasing dyspnea on exertion for the past few month is quite concerning.  I recommended a nursing visit to check the EKG and make sure he is not volume overloaded.  If there is no sign of volume overload, plan to proceed with Myoview  2. CAD: See #1, plan to proceed with Myoview  3. COPD: Unclear if the recent symptoms may be related to COPD exacerbation, plan to do a physical exam when she comes in for nursing visit  4. Hypertension: Blood pressure stable  5. Hyperlipidemia: On Crestor  6. DM2: Managed by primary care provider  7. PAF: History of ablation in 2010.  Given no recurrence, he is not on any anticoagulation therapy  8. History of pacemaker: Stable on last interrogation.  COVID-19 Education: The signs and symptoms of COVID-19 were discussed with the patient and how to seek care for testing (follow up with PCP or arrange E-visit).  The importance of social distancing was discussed today.  Time:   Today, I have spent 9 minutes with the patient with telehealth technology discussing the above problems.     Medication Adjustments/Labs and Tests Ordered: Current medicines are reviewed at length with the patient today.  Concerns regarding medicines are outlined above.   Tests Ordered: Orders Placed This Encounter  Procedures  . MYOCARDIAL PERFUSION IMAGING    Medication Changes: No orders of the defined types were placed in this encounter.   Follow Up:  In Person in 4 week(s)  Signed, Almyra Deforest, PA  03/01/2020 9:52 PM    [facility]

## 2020-02-29 ENCOUNTER — Ambulatory Visit (INDEPENDENT_AMBULATORY_CARE_PROVIDER_SITE_OTHER): Payer: Medicare Other | Admitting: *Deleted

## 2020-02-29 ENCOUNTER — Other Ambulatory Visit: Payer: Self-pay

## 2020-02-29 ENCOUNTER — Telehealth: Payer: Self-pay | Admitting: Physician Assistant

## 2020-02-29 DIAGNOSIS — R079 Chest pain, unspecified: Secondary | ICD-10-CM

## 2020-02-29 NOTE — Telephone Encounter (Signed)
Patient presents today for nursing visit.  He does not appear to be volume overloaded on exam, he has no lower extremity edema and his lungs is clear.  He does have an aortic valve murmur.  Last echocardiogram obtained in December 2020 did not show any significant valve issue.  EKG showed paced rhythm with heart rate of 75.  Based on physical exam, I did not see anything that could explain his overall symptom.  We will proceed with previous plan of Lexiscan Myoview.  EKG personally reviewed today, atrial sensed ventricular paced rhythm, heart rate 75 bpm.

## 2020-03-06 ENCOUNTER — Telehealth (HOSPITAL_COMMUNITY): Payer: Self-pay | Admitting: *Deleted

## 2020-03-06 NOTE — Telephone Encounter (Signed)
Close encounter 

## 2020-03-07 ENCOUNTER — Ambulatory Visit (HOSPITAL_COMMUNITY)
Admission: RE | Admit: 2020-03-07 | Discharge: 2020-03-07 | Disposition: A | Discharge status: Home / Self Care | Payer: Medicare Other | Source: Ambulatory Visit | Attending: Cardiovascular Disease | Admitting: Cardiovascular Disease

## 2020-03-07 ENCOUNTER — Other Ambulatory Visit: Payer: Self-pay

## 2020-03-07 DIAGNOSIS — R079 Chest pain, unspecified: Secondary | ICD-10-CM | POA: Insufficient documentation

## 2020-03-07 DIAGNOSIS — R0609 Other forms of dyspnea: Secondary | ICD-10-CM

## 2020-03-07 DIAGNOSIS — R0600 Dyspnea, unspecified: Secondary | ICD-10-CM | POA: Diagnosis not present

## 2020-03-07 LAB — MYOCARDIAL PERFUSION IMAGING
LV dias vol: 106 mL (ref 62–150)
LV sys vol: 49 mL
Peak HR: 76 {beats}/min
Rest HR: 67 {beats}/min
SDS: 1
SRS: 10
SSS: 11
TID: 1.18

## 2020-03-07 MED ORDER — REGADENOSON 0.4 MG/5ML IV SOLN
0.4000 mg | Freq: Once | INTRAVENOUS | Status: AC
  Administered 2020-03-07: 0.4 mg via INTRAVENOUS

## 2020-03-07 MED ORDER — TECHNETIUM TC 99M TETROFOSMIN IV KIT
27.9000 | PACK | Freq: Once | INTRAVENOUS | Status: AC | PRN
  Administered 2020-03-07: 27.9 via INTRAVENOUS
  Filled 2020-03-07: qty 28

## 2020-03-07 MED ORDER — TECHNETIUM TC 99M TETROFOSMIN IV KIT
10.6000 | PACK | Freq: Once | INTRAVENOUS | Status: AC | PRN
  Administered 2020-03-07: 10.6 via INTRAVENOUS
  Filled 2020-03-07: qty 11

## 2020-03-07 NOTE — Progress Notes (Signed)
Will discuss during next visit, largely normal pumping function of heart, no sign of significant reversible blockage, there was a fixed defect, this could be artifact or previous scar (which is not reversible). Given recent atypical chest pain, I am leaning toward artifact. Will reassess symptom on follow up

## 2020-04-07 IMAGING — CT CT HEAD WITHOUT CONTRAST
3 of 4 series · 13 of 47 positions shown, 15 images · non-contrast
Comparison: Brain MRI June 06, 2013

CLINICAL DATA: Headache and syncope

EXAM:
CT HEAD WITHOUT CONTRAST
TECHNIQUE: Contiguous axial images were obtained from the base of the skull
through the vertex without intravenous contrast.

[Series 3: head wo · axial · 0.46mm/px · z∈[-166,-26]mm · 7 of 38 slices shown, 9 images]
[im 5/38  brain]
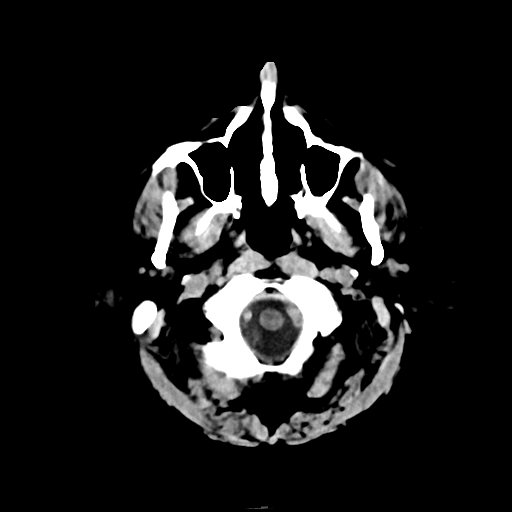
[im 5/38  bone]
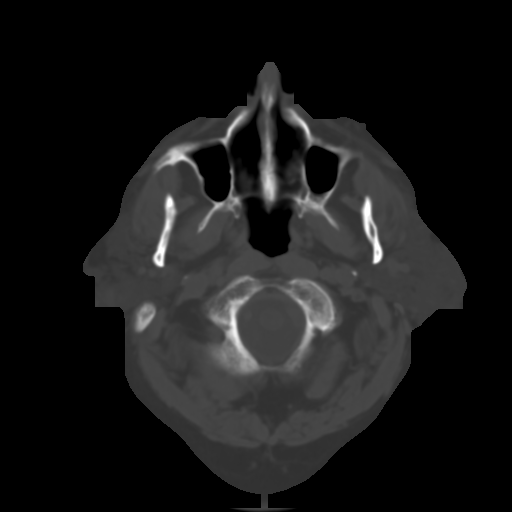
[im 10/38  brain]
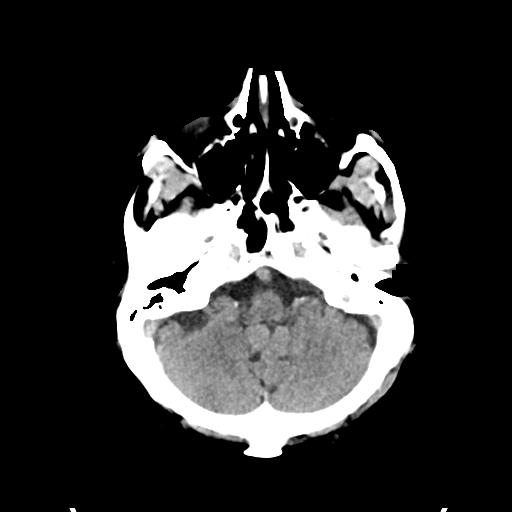
[im 14/38  brain]
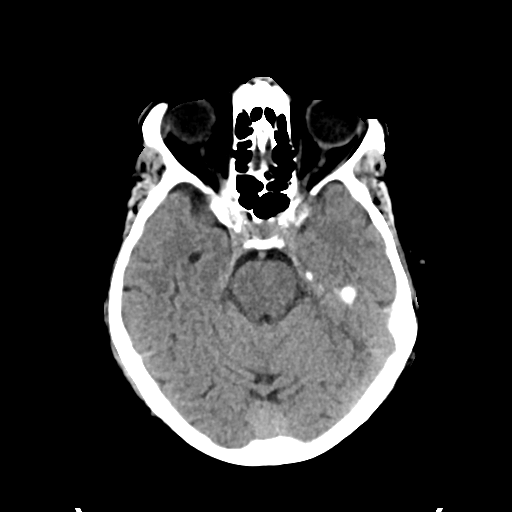
[im 19/38  brain]
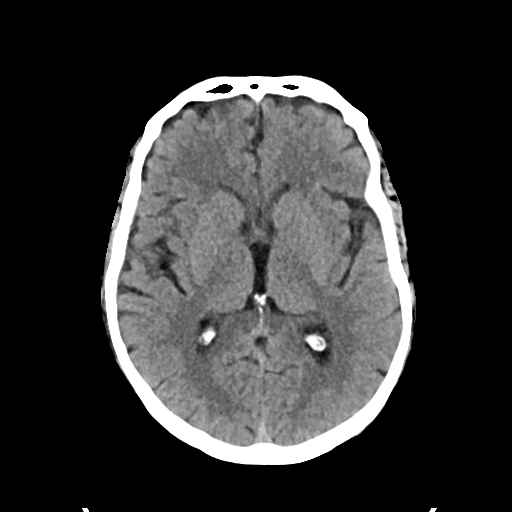
[im 24/38  brain]
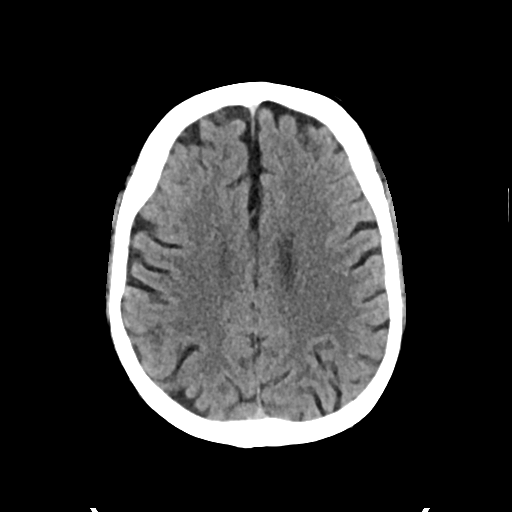
[im 24/38  bone]
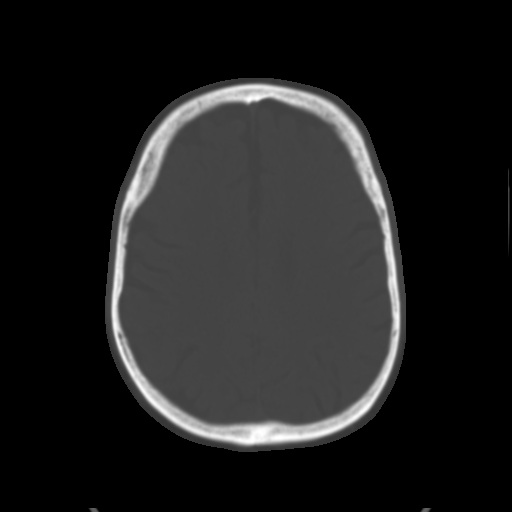
[im 28/38  brain]
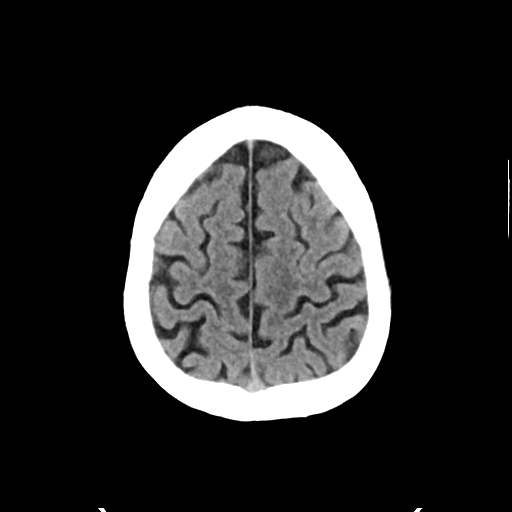
[im 33/38  brain]
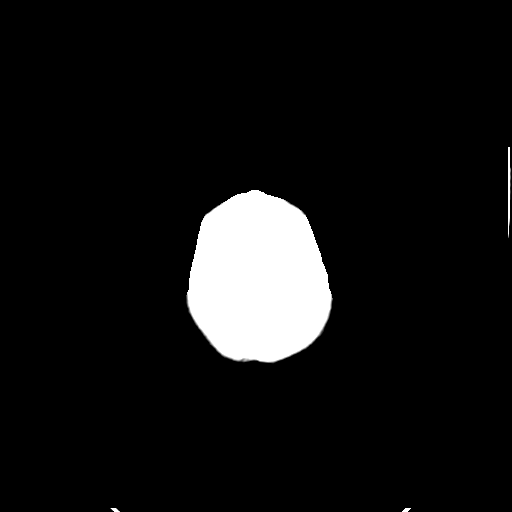

[Series 5: cor soft · coronal · 0.36mm/px · 3 of 78 slices shown]
[im 26/78  brain]
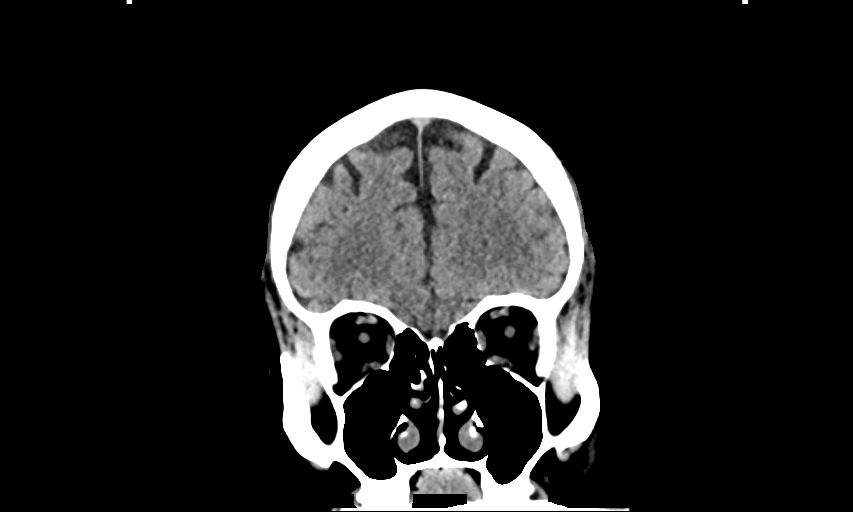
[im 35/78  brain]
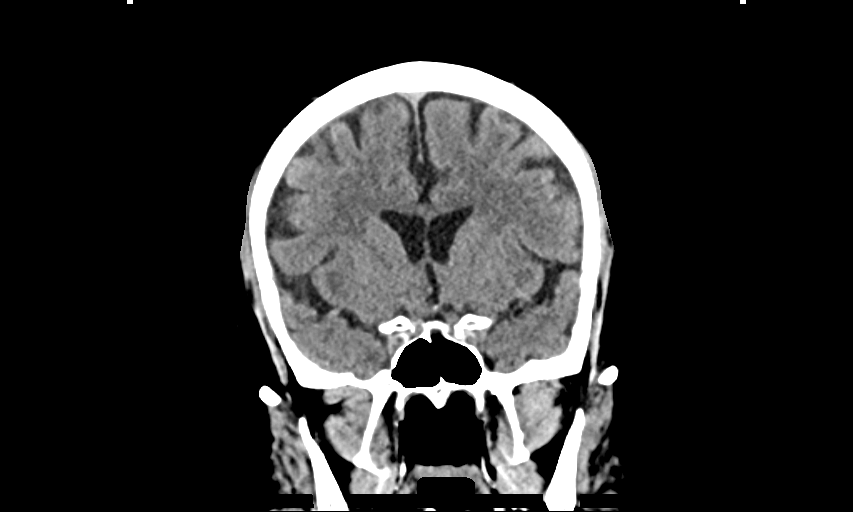
[im 43/78  brain]
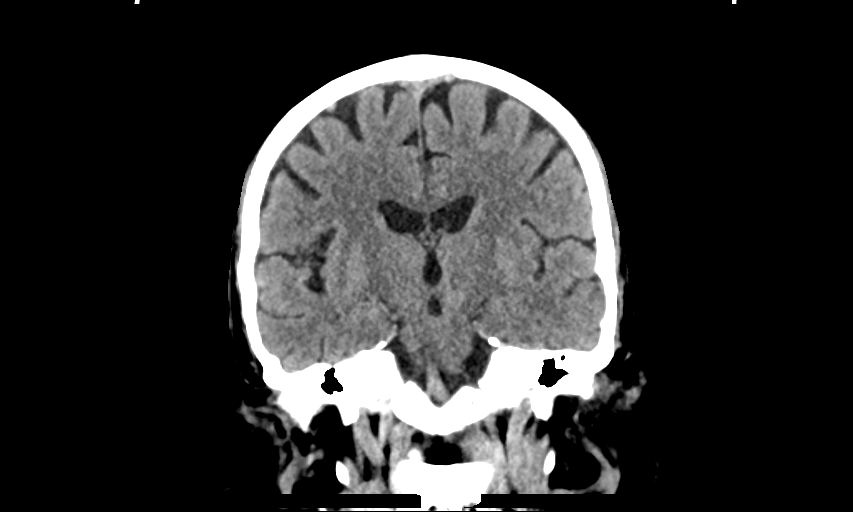

[Series 6: sag soft · sagittal · 0.36mm/px · 3 of 67 slices shown]
[im 23/67  brain]
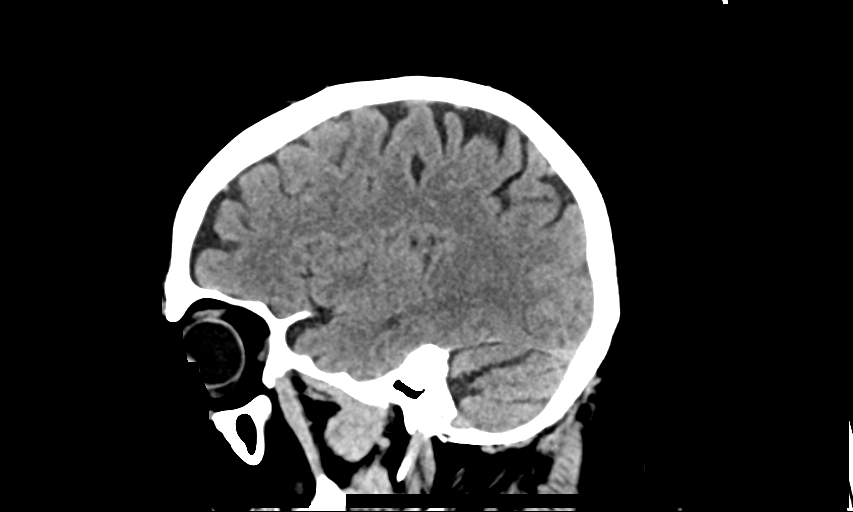
[im 34/67  brain]
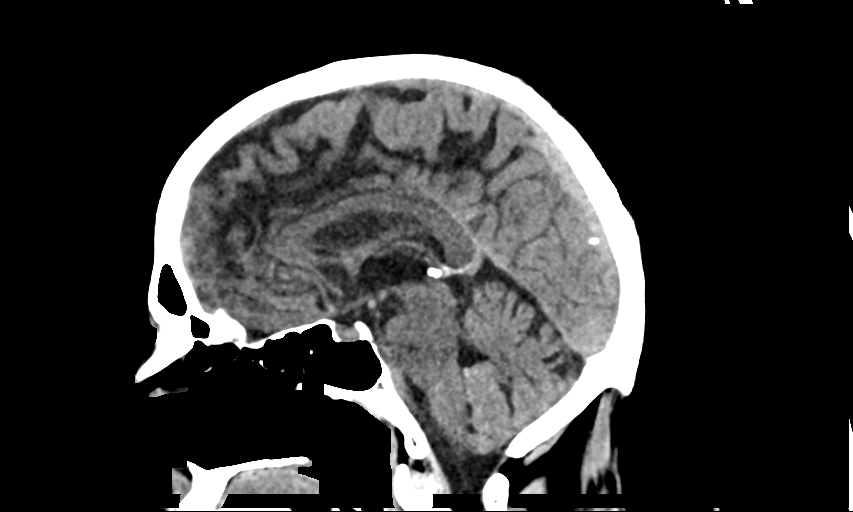
[im 45/67  brain]
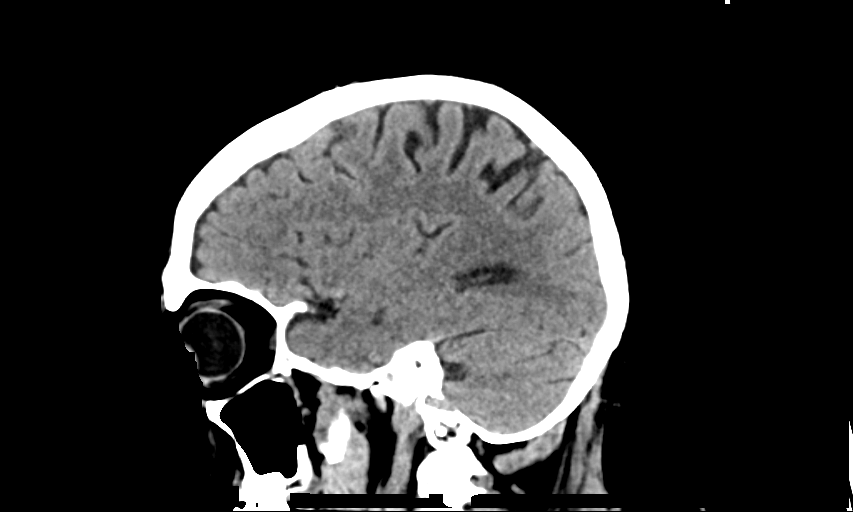

[13 of 47 positions shown; findings below may reference images not displayed]

FINDINGS: Brain: The ventricles are normal in size and configuration. There is
no intracranial mass, hemorrhage, extra-axial fluid collection, or
midline shift. There is focal decreased attenuation in the midline
of the medulla, a finding that potentially may represent a recent
focal infarct in this area. Elsewhere brain parenchyma appears
unremarkable.

Vascular: There is no appreciable hyperdense vessel. There are foci
of calcification in each carotid siphon region.

Skull: The bony calvarium appears intact.

Sinuses/Orbits: There is mucosal thickening in several ethmoid air
cells. Other paranasal sinuses are clear. Orbits appear symmetric
bilaterally.

Other: Mastoid air cells are clear.
IMPRESSION: 1. Decreased attenuation in the midline of the inferior medulla. A
focal infarct which may be recent is of concern given this
appearance. Elsewhere brain parenchyma appears unremarkable. No mass
or hemorrhage.

2.  There are foci of arterial vascular calcification.

3.  Mucosal thickening noted in several ethmoid air cells.

## 2020-04-07 IMAGING — DX PORTABLE CHEST - 1 VIEW
1 series · 1 of 1 positions shown · non-contrast
Comparison: Radiographs February 09, 2016.

CLINICAL DATA: Syncope.

EXAM:
PORTABLE CHEST 1 VIEW

[chest ap]
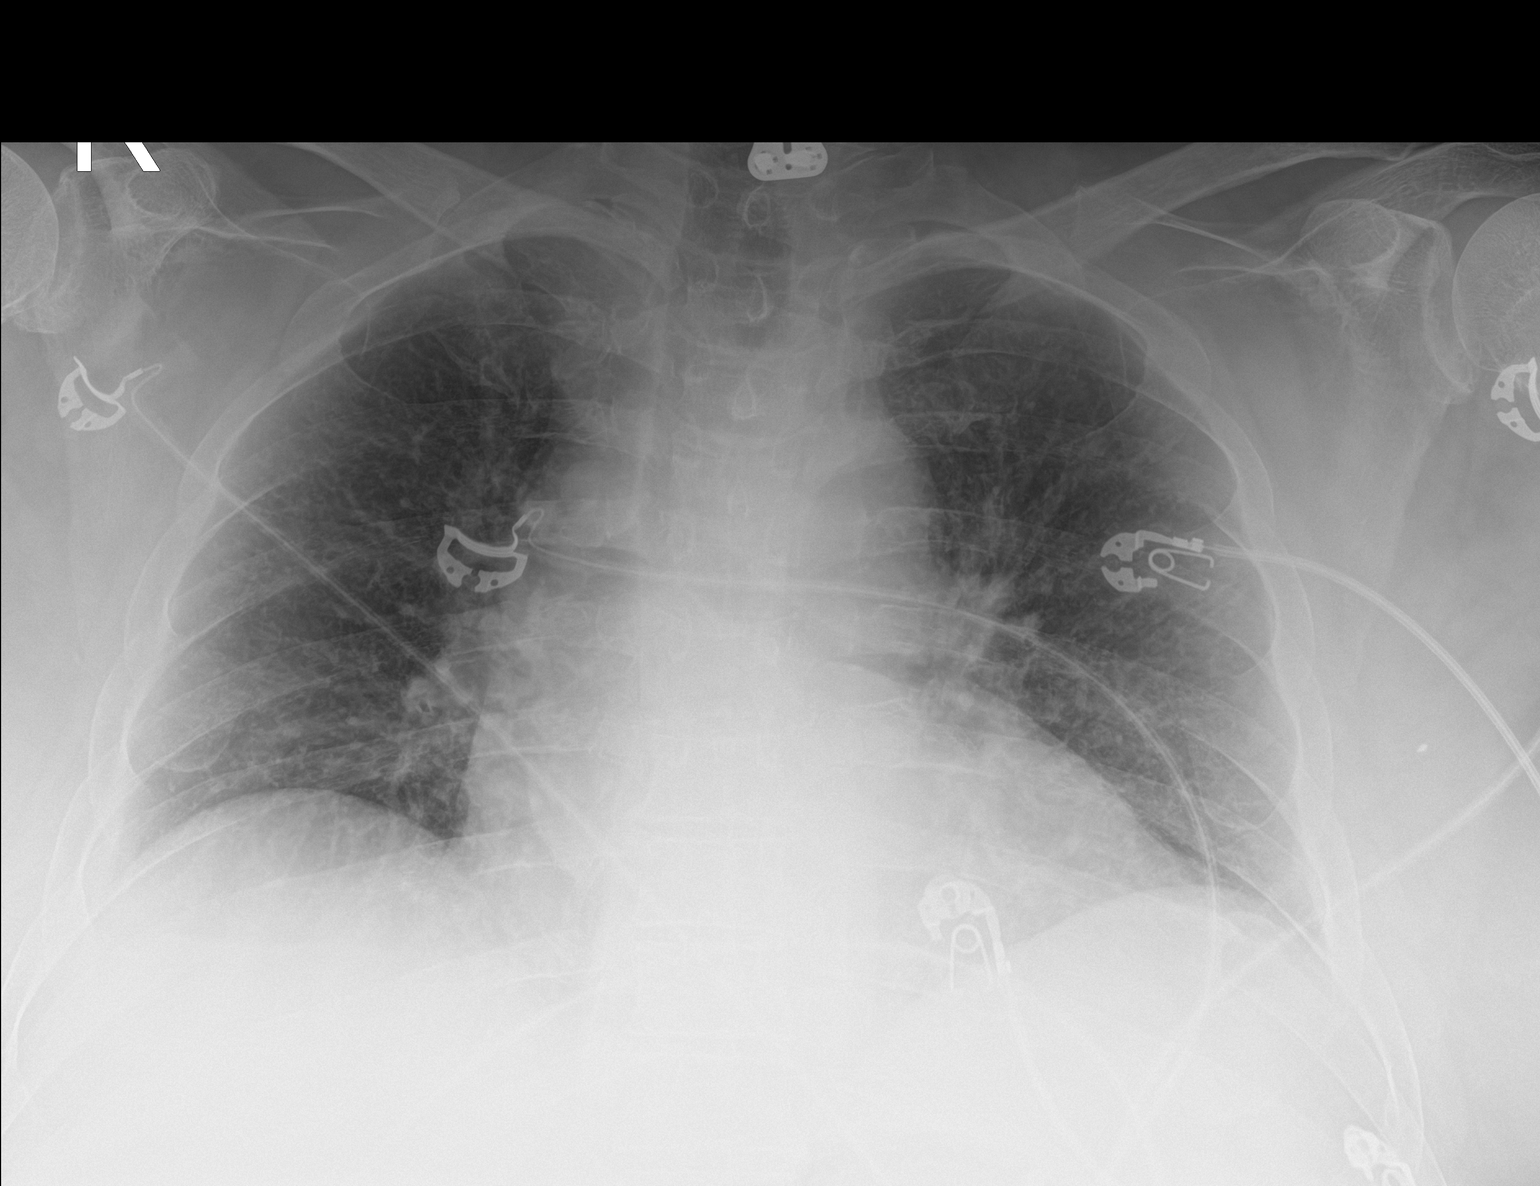

[1 of 1 positions shown; findings below may reference images not displayed]

FINDINGS: The heart size and mediastinal contours are within normal limits.
Both lungs are clear. The visualized skeletal structures are
unremarkable.
IMPRESSION: No active disease.

## 2020-04-08 ENCOUNTER — Telehealth (INDEPENDENT_AMBULATORY_CARE_PROVIDER_SITE_OTHER): Payer: Medicare Other | Admitting: Physician Assistant

## 2020-04-08 ENCOUNTER — Telehealth: Payer: Self-pay

## 2020-04-08 ENCOUNTER — Encounter: Payer: Self-pay | Admitting: Physician Assistant

## 2020-04-08 VITALS — BP 120/76 | HR 70 | Ht 67.0 in | Wt 244.0 lb

## 2020-04-08 DIAGNOSIS — I2510 Atherosclerotic heart disease of native coronary artery without angina pectoris: Secondary | ICD-10-CM | POA: Diagnosis not present

## 2020-04-08 DIAGNOSIS — I10 Essential (primary) hypertension: Secondary | ICD-10-CM | POA: Diagnosis not present

## 2020-04-08 DIAGNOSIS — E785 Hyperlipidemia, unspecified: Secondary | ICD-10-CM

## 2020-04-08 DIAGNOSIS — J449 Chronic obstructive pulmonary disease, unspecified: Secondary | ICD-10-CM

## 2020-04-08 DIAGNOSIS — E119 Type 2 diabetes mellitus without complications: Secondary | ICD-10-CM

## 2020-04-08 DIAGNOSIS — Z950 Presence of cardiac pacemaker: Secondary | ICD-10-CM

## 2020-04-08 DIAGNOSIS — I480 Paroxysmal atrial fibrillation: Secondary | ICD-10-CM

## 2020-04-08 NOTE — Progress Notes (Signed)
Virtual Visit via Telephone Note   This visit type was conducted due to national recommendations for restrictions regarding the COVID-19 Pandemic (e.g. social distancing) in an effort to limit this patient's exposure and mitigate transmission in our community.  Due to his co-morbid illnesses, this patient is at least at moderate risk for complications without adequate follow up.  This format is felt to be most appropriate for this patient at this time.  The patient did not have access to video technology/had technical difficulties with video requiring transitioning to audio format only (telephone).  All issues noted in this document were discussed and addressed.  No physical exam could be performed with this format.  Please refer to the patient's chart for his  consent to telehealth for [facility].    Date:  04/11/2020   ID:  Lee [name], DOB February 18, 1951, MRN [id number] The patient was identified using 2 identifiers.  Patient Location: Home Provider Location: Home Office  PCP:  Susy Frizzle, MD  Cardiologist:  Shelva Majestic, MD  Electrophysiologist:  [name], MD   Evaluation Performed:  Follow-Up Visit  Chief Complaint:  Follow up  History of Present Illness:    Lee [name] is a 69 y.o. male with past medical history of CAD, COPD, HTN, HLD, DM 2, PAF s/p ablation 2010 (no longer on anticoagulation), symptomatic bradycardia s/p PPM and obstructive sleep apnea.  In 2009, he underwent stenting of mid LAD.  He underwent staged intervention to proximal RCA and proximal LAD in 2012.  Myoview in February 2014 showed normal perfusion and function.  He underwent repeat cardiac catheterization January 2015 for chest pain which did not demonstrate a significant restenosis in LAD and RCA stent.  Initially, he was placed on long-acting nitrate for antianginal purposes, this was later discontinued due to significant headache.  Myoview in April 2017 showed EF 63%, normal perfusion  without scar or ischemia.  He was admitted to the hospital in April 2020 with syncope suspected to be vasovagal event.  He was also noted to have significant bradycardia, diltiazem was stopped. Echocardiogram obtained during the hospitalization showed EF 60 to 65%, grade 2 DD, no significant valve issue.  He was readmitted in May 2020 with recurrent syncope and found to have pauses on his heart monitor lasting up to 12 seconds.  EMS found him to be in complete heart block with heart rate in the 20s.  He was readmitted and carvedilol was discontinued.  He was evaluated by EP service as outpatient and eventually underwent St Jude dual-chamber pacemaker by Dr. Rayann Heman for symptomatic Mobitz 2 second-degree AV block on 05/01/2019.   I last saw the patient virtually on 02/28/2020 for dyspnea on exertion.  He also describes a sharp stabbing pain in the central chest as well.  Myoview was performed on 03/07/2020 which demonstrated EF 54%, large defect of moderate severity present in the basal inferior, mid inferior and apical inferior location consistent with artifact, no significant reversible ischemia.  Patient presents today for virtual visit.  His chest pain has resolved.  Dyspnea on exertion is also under control as well.  He does not have any lower extremity edema, orthopnea or PND.  He can follow-up with Dr. Claiborne Billings either in May or June 2022.  No further work-up is recommended at this point.  The patient does not have symptoms concerning for COVID-19 infection (fever, chills, cough, or new shortness of breath).    Past Medical History:  Diagnosis Date  . Allergic  rhinitis    chronic sinusitis  . Arthritis   . CAD (coronary artery disease)    a. 2009 PCI/DES to mLAD; b. 05/2010 s/p DES RCA and LAD.; c. 05/2013 Cath: LAD mild ISR, LCX nl, RCA 40p, patent stents.  . Cataract    has had lasik surg  . COPD (chronic obstructive pulmonary disease) ([place])   . Diabetes mellitus type 2, controlled, without  complications ([place])   . Diverticulosis   . DJD (degenerative joint disease)   . Esophagitis 1991   grade 1  . GERD (gastroesophageal reflux disease)    Hiatal hernia  . Hemorrhoids   . Hyperlipidemia   . Hypertensive heart disease   . Iron deficiency anemia   . Migraines   . Paroxysmal atrial fibrillation (HCC)    a. s/p afib ablation 10/22/08 ([facility]).  . PVC's (premature ventricular contractions)   . Sleep apnea    Questionalble: RDI during the total sleep time 6h 28 mins was 3.55/hr during REM sleep was at 5.71/hr. Supine AHI was 5.93/hr.  . Syncope 08/2018   Past Surgical History:  Procedure Laterality Date  . CARDIAC CATHETERIZATION  05/2010   The proximal LAD was then predilated with 2.0 x 12 trek. this was then stented with a 2.5 x 16 promus Element drug-eluting stent at 14 atmosphere and prostdilated with 2.75 x 12 [place] trek at 16 atmospheres(2.8 mm) resulting the reduction of 80% proximal LAD stenosis to 0% residual with excellent flow.  Marland Kitchen CARDIAC CATHETERIZATION  06/05/2010   Successful percutaneous coronary intervention to the right coronary artery with percutaneous transluminal coronary angioplasty/stenting and insertion 3.0 x 16 mm Promus DES post dilated to 3.35 mm with stenosis being reduced to 0%  . CARDIAC CATHETERIZATION  02/2008   Post dilatation was performed using a 2.75 x 9 Acme sprinter, 10 atmospheres for 40 seconds and then 9 atmosphere for 35 sec. This resulted in the 80% area of narrowing pre-intervention, now appearing to normal. There was no edvidence of the dissection or thrombus and there was TIMI III flow pre and post.  . CARDIAC CATHETERIZATION  02/2006  . CORONARY ANGIOPLASTY WITH STENT PLACEMENT     3 stents/ 4 caths  . EYE SURGERY    . KNEE ARTHROSCOPY     right  . LASIK    . LEFT HEART CATHETERIZATION WITH CORONARY ANGIOGRAM N/A 06/29/2013   Procedure: LEFT HEART CATHETERIZATION WITH CORONARY ANGIOGRAM;  Surgeon: Leonie Man, MD;  Location: [facility]  CATH LAB;  Service: Cardiovascular;  Laterality: N/A;  . NASAL SINUS SURGERY  2001  . NECK SURGERY     Cervical fusion  . PACEMAKER IMPLANT N/A 05/01/2019   Procedure: PACEMAKER IMPLANT;  Surgeon: [name], MD;  Location: [facility] CV LAB;  Service: Cardiovascular;  Laterality: N/A;  . RADIOFREQUENCY ABLATION  May 2010   atrial fibrillation  . ROTATOR CUFF REPAIR     left  . SHOULDER OPEN ROTATOR CUFF REPAIR     right  . SPINE SURGERY    . WRIST ARTHROCENTESIS     left     Current Meds  Medication Sig  . Accu-Chek FastClix Lancets MISC USE TO CHECK BLOOD SUGAR TWICE DAILY  . acetaminophen (TYLENOL) 325 MG tablet Take 1-2 tablets (325-650 mg total) by mouth every 4 (four) hours as needed for mild pain.  Marland Kitchen amLODipine (NORVASC) 10 MG tablet TAKE 1 TABLET(10 MG) BY MOUTH DAILY  . aspirin EC 81 MG tablet Take 81 mg by mouth  at bedtime.  . Blood Glucose Monitoring Suppl (ACCU-CHEK AVIVA PLUS) w/Device KIT Check BS bid E11.9  . cholecalciferol (VITAMIN D) 1000 UNITS tablet Take 1,000 Units by mouth at bedtime.   . citalopram (CELEXA) 40 MG tablet TAKE 1 TABLET(40 MG) BY MOUTH DAILY  . Coenzyme Q10 (CO Q 10) 100 MG CAPS Take 100 mg by mouth daily with breakfast.   . Erenumab-aooe (AIMOVIG) 140 MG/ML SOAJ Inject 140 mg into the skin every 30 (thirty) days. On or about the 1st of each month  . fexofenadine (ALLEGRA) 180 MG tablet Take 180 mg by mouth daily with breakfast.   . fluticasone (FLONASE) 50 MCG/ACT nasal spray INSTILL 2 SPRAYS IN THE  NOSE AT BEDTIME  . furosemide (LASIX) 40 MG tablet TAKE 1 TABLET BY MOUTH  DAILY  . glucose blood (ACCU-CHEK AVIVA PLUS) test strip Check BS bid E11.9  . JARDIANCE 25 MG TABS tablet TAKE 1 TABLET BY MOUTH DAILY BEFORE BREAKFAST  . Lancets Misc. (ACCU-CHEK FASTCLIX LANCET) KIT Check BS bid E11.9  . Multiple Vitamin (MULTIVITAMIN WITH MINERALS) TABS tablet Take 1 tablet by mouth daily with breakfast.  . nitroGLYCERIN (NITROSTAT) 0.4 MG SL tablet  PLACE ONE TABLET UNDER THE TONGUE EVERY 5 MINUTES AS NEEDED FOR CHEST PAIN  . oxyCODONE-acetaminophen (PERCOCET) 10-325 MG tablet Take 1 tablet by mouth every 8 (eight) hours as needed for pain.  . pantoprazole (PROTONIX) 40 MG tablet TAKE 1 TABLET BY MOUTH  DAILY  . Polyvinyl Alcohol-Povidone (REFRESH OP) Place 1 drop into both eyes at bedtime as needed (dry eyes).  . potassium chloride (MICRO-K) 10 MEQ CR capsule TAKE 1 CAPSULE BY MOUTH  DAILY  . rosuvastatin (CRESTOR) 40 MG tablet TAKE 1 TABLET BY MOUTH  DAILY  . telmisartan (MICARDIS) 40 MG tablet TAKE 1 TABLET BY MOUTH  DAILY  . zolpidem (AMBIEN) 5 MG tablet Take 1 tablet (5 mg total) by mouth at bedtime as needed for sleep.     Allergies:   Ibuprofen, Other, Topamax [topiramate], Toprol xl [metoprolol succinate], and Metoprolol-hctz er   Social History   Tobacco Use  . Smoking status: Former Smoker    Quit date: 06/01/1987    Years since quitting: 32.8  . Smokeless tobacco: Never Used  Vaping Use  . Vaping Use: Never used  Substance Use Topics  . Alcohol use: Yes    Alcohol/week: 0.0 standard drinks    Comment: once every 6-7 months  . Drug use: No     Family Hx: The patient's family history includes Colon cancer in his mother; Diabetes in his father; Heart disease in his father and sister; Uterine cancer in his mother.  ROS:   Please see the history of present illness.     All other systems reviewed and are negative.   Prior CV studies:   The following studies were reviewed today:  Echo 05/01/2019 1. Left ventricular ejection fraction, by visual estimation, is 60 to  65%. The left ventricle has normal function. Left ventricular septal wall  thickness was mildly increased. There is no left ventricular hypertrophy.  2. Definity contrast agent was given IV to delineate the left ventricular  endocardial borders.  3. Left ventricular diastolic parameters are indeterminate.  4. Global right ventricle has normal  systolic function.The right  ventricular size is normal. No increase in right ventricular wall  thickness.  5. Left atrial size was moderately dilated.  6. Right atrial size was normal.  7. The mitral valve is normal in structure.  Trace mitral valve  regurgitation. No evidence of mitral stenosis.  8. The tricuspid valve is normal in structure. Tricuspid valve  regurgitation is not demonstrated.  9. The aortic valve is tricuspid. Aortic valve regurgitation is not  visualized. Mild to moderate aortic valve sclerosis/calcification without  any evidence of aortic stenosis.  10. Small gradient across AV with no significant AS.  11. The pulmonic valve was normal in structure. Pulmonic valve  regurgitation is not visualized.  12. The inferior vena cava is normal in size with greater than 50%  respiratory variability, suggesting right atrial pressure of 3 mmHg.  13. The interatrial septum was not well visualized.   Labs/Other Tests and Data Reviewed:    EKG:  An ECG dated 02/29/2020 was personally reviewed today and demonstrated:  Atrial sensed ventricularly paced rhythm.  Recent Labs: 04/30/2019: Magnesium 1.9 05/01/2019: TSH 1.200 11/06/2019: ALT 17; BUN 15; Creat 0.88; Hemoglobin 14.8; Platelets 211; Potassium 4.5; Sodium 137   Recent Lipid Panel Lab Results  Component Value Date/Time   CHOL 127 11/06/2019 08:52 AM   CHOL 155 11/21/2012 10:25 AM   TRIG 61 11/06/2019 08:52 AM   TRIG 81 11/21/2012 10:25 AM   HDL 48 11/06/2019 08:52 AM   HDL 45 11/21/2012 10:25 AM   CHOLHDL 2.6 11/06/2019 08:52 AM   LDLCALC 65 11/06/2019 08:52 AM   LDLCALC 94 11/21/2012 10:25 AM    Wt Readings from Last 3 Encounters:  04/08/20 244 lb (110.7 kg)  03/07/20 245 lb (111.1 kg)  02/28/20 245 lb (111.1 kg)     Risk Assessment/Calculations:     CHA2DS2-VASc Score = 4  This indicates a 4.8% annual risk of stroke. The patient's score is based upon: CHF History: 0 HTN History: 1 Diabetes History:  1 Stroke History: 0 Vascular Disease History: 1 Age Score: 1 Gender Score: 0     Objective:    Vital Signs:  BP 120/76   Pulse 70   Ht '5\' 7"'  (1.702 m)   Wt 244 lb (110.7 kg)   BMI 38.22 kg/m    VITAL SIGNS:  reviewed  ASSESSMENT & PLAN:    1. CAD: Denies any recent chest pain.  Myoview was reassuring.  2. Hypertension: Continue on current therapy.  3. Hyperlipidemia: On rosuvastatin  4. DM2: Managed by primary care provider  5. History of PAF s/p ablation: No longer on anticoagulation therapy  6. COPD: No recent exacerbation  7. History of symptomatic bradycardia: Status post pacemaker implantation.  Follow-up with Dr. Rayann Heman.   COVID-19 Education: The signs and symptoms of COVID-19 were discussed with the patient and how to seek care for testing (follow up with PCP or arrange E-visit).  The importance of social distancing was discussed today.  Time:   Today, I have spent 10 minutes with the patient with telehealth technology discussing the above problems.     Medication Adjustments/Labs and Tests Ordered: Current medicines are reviewed at length with the patient today.  Concerns regarding medicines are outlined above.   Tests Ordered: No orders of the defined types were placed in this encounter.   Medication Changes: No orders of the defined types were placed in this encounter.   Follow Up:  In Person in 6 month(s)  Signed, Almyra Deforest, [place]  04/11/2020 12:01 AM    [place]

## 2020-04-08 NOTE — Telephone Encounter (Signed)
  Patient Consent for Virtual Visit         Lee [name] has provided verbal consent on 04/08/2020 for a virtual visit (video or telephone).   CONSENT FOR VIRTUAL VISIT FOR:  Lee [name]  By participating in this virtual visit I agree to the following:  I hereby voluntarily request, consent and authorize [facility] and its employed or contracted physicians, physician assistants, nurse practitioners or other licensed health care professionals (the Practitioner), to provide me with telemedicine health care services (the "Services") as deemed necessary by the treating Practitioner. I acknowledge and consent to receive the Services by the Practitioner via telemedicine. I understand that the telemedicine visit will involve communicating with the Practitioner through live audiovisual communication technology and the disclosure of certain medical information by electronic transmission. I acknowledge that I have been given the opportunity to request an in-person assessment or other available alternative prior to the telemedicine visit and am voluntarily participating in the telemedicine visit.  I understand that I have the right to withhold or withdraw my consent to the use of telemedicine in the course of my care at any time, without affecting my right to future care or treatment, and that the Practitioner or I may terminate the telemedicine visit at any time. I understand that I have the right to inspect all information obtained and/or recorded in the course of the telemedicine visit and may receive copies of available information for a reasonable fee.  I understand that some of the potential risks of receiving the Services via telemedicine include:  Marland Kitchen Delay or interruption in medical evaluation due to technological equipment failure or disruption; . Information transmitted may not be sufficient (e.g. poor resolution of images) to allow for appropriate medical decision making by the  Practitioner; and/or  . In rare instances, security protocols could fail, causing a breach of personal health information.  Furthermore, I acknowledge that it is my responsibility to provide information about my medical history, conditions and care that is complete and accurate to the best of my ability. I acknowledge that Practitioner's advice, recommendations, and/or decision may be based on factors not within their control, such as incomplete or inaccurate data provided by me or distortions of diagnostic images or specimens that may result from electronic transmissions. I understand that the practice of medicine is not an exact science and that Practitioner makes no warranties or guarantees regarding treatment outcomes. I acknowledge that a copy of this consent can be made available to me via my patient portal ([facility]), or I can request a printed copy by calling the office of [facility].    I understand that my insurance will be billed for this visit.   I have read or had this consent read to me. . I understand the contents of this consent, which adequately explains the benefits and risks of the Services being provided via telemedicine.  . I have been provided ample opportunity to ask questions regarding this consent and the Services and have had my questions answered to my satisfaction. . I give my informed consent for the services to be provided through the use of telemedicine in my medical care

## 2020-04-08 NOTE — Patient Instructions (Signed)
Medication Instructions:  Your physician recommends that you continue on your current medications as directed. Please refer to the Current Medication list given to you today.  *If you need a refill on your cardiac medications before your next appointment, please call your pharmacy*  Lab Work: NONE ordered at this time of appointment   If you have labs (blood work) drawn today and your tests are completely normal, you will receive your results only by: Marland Kitchen MyChart Message (if you have MyChart) OR . A paper copy in the mail If you have any lab test that is abnormal or we need to change your treatment, we will call you to review the results.  Testing/Procedures: NONE ordered at this time of appointment   Follow-Up: At [facility], you and your health needs are our priority.  As part of our continuing mission to provide you with exceptional heart care, we have created designated Provider Care Teams.  These Care Teams include your primary Cardiologist (physician) and Advanced Practice Providers (APPs -  Physician Assistants and Nurse Practitioners) who all work together to provide you with the care you need, when you need it.  Your next appointment:   5-6 month(s) Follow up in April 2022  The format for your next appointment:   In Person In Person   Provider:   Shelva Majestic, MD [name], MD  Other Instructions

## 2020-04-10 ENCOUNTER — Other Ambulatory Visit: Payer: Self-pay | Admitting: Family Medicine

## 2020-04-11 ENCOUNTER — Other Ambulatory Visit: Payer: Self-pay

## 2020-04-11 ENCOUNTER — Ambulatory Visit (INDEPENDENT_AMBULATORY_CARE_PROVIDER_SITE_OTHER): Payer: Medicare Other | Admitting: Podiatry

## 2020-04-11 DIAGNOSIS — Q828 Other specified congenital malformations of skin: Secondary | ICD-10-CM

## 2020-04-11 DIAGNOSIS — M7751 Other enthesopathy of right foot: Secondary | ICD-10-CM

## 2020-04-11 DIAGNOSIS — M7750 Other enthesopathy of unspecified foot: Secondary | ICD-10-CM

## 2020-04-14 ENCOUNTER — Other Ambulatory Visit: Payer: Self-pay

## 2020-04-14 ENCOUNTER — Telehealth: Payer: Self-pay | Admitting: Family Medicine

## 2020-04-14 MED ORDER — OXYCODONE-ACETAMINOPHEN 10-325 MG PO TABS
1.0000 | ORAL_TABLET | Freq: Three times a day (TID) | ORAL | 0 refills | Status: DC | PRN
Start: 2020-04-14 — End: 2020-04-15

## 2020-04-14 NOTE — Telephone Encounter (Signed)
Refill Oxycodone 

## 2020-04-14 NOTE — Telephone Encounter (Signed)
Rx refill sent to Provider 

## 2020-04-14 NOTE — Progress Notes (Signed)
Subjective:   Patient ID: Lee [name], male   DOB: 69 y.o.   MRN: [id number]   HPI Patient presents with painful lesions on both feet and also has discomfort on the dorsum of the right foot with inflammation   ROS      Objective:  Physical Exam  Neurovascular status intact with patient found to have dorsal discomfort with swelling of the extensor tendon complex right and also was noted to have keratotic lesion subfifth metatarsal bilateral that are painful when pressed and make shoe gear difficult     Assessment:  Extensor tendinitis right inflammation fluid buildup along with significant keratotic lesion fifth metatarsal bilateral painful      Plan:  H&P reviewed condition sterile prep done injected the extensor complex 3 mg Kenalog 5 mg Xylocaine advised on anti-inflammatories physical therapy and debrided lesions bilateral with no iatrogenic bleeding noted.  Reappoint as needed

## 2020-04-15 MED ORDER — OXYCODONE-ACETAMINOPHEN 10-325 MG PO TABS
1.0000 | ORAL_TABLET | Freq: Three times a day (TID) | ORAL | 0 refills | Status: DC | PRN
Start: 2020-04-15 — End: 2021-01-23

## 2020-04-15 NOTE — Telephone Encounter (Signed)
Please re-submit as transmission to pharmacy failed.  

## 2020-04-15 NOTE — Addendum Note (Signed)
Addended by: Sheral Flow on: 04/15/2020 08:58 AM   Modules accepted: Orders

## 2020-04-21 ENCOUNTER — Other Ambulatory Visit: Payer: Self-pay | Admitting: Family Medicine

## 2020-04-22 ENCOUNTER — Other Ambulatory Visit: Payer: Self-pay | Admitting: Family Medicine

## 2020-04-27 IMAGING — DX PORTABLE CHEST - 1 VIEW
1 series · 1 of 1 positions shown · non-contrast
Comparison: 09/20/2018

CLINICAL DATA: Chest pain.

EXAM:
PORTABLE CHEST 1 VIEW

[chest ap]
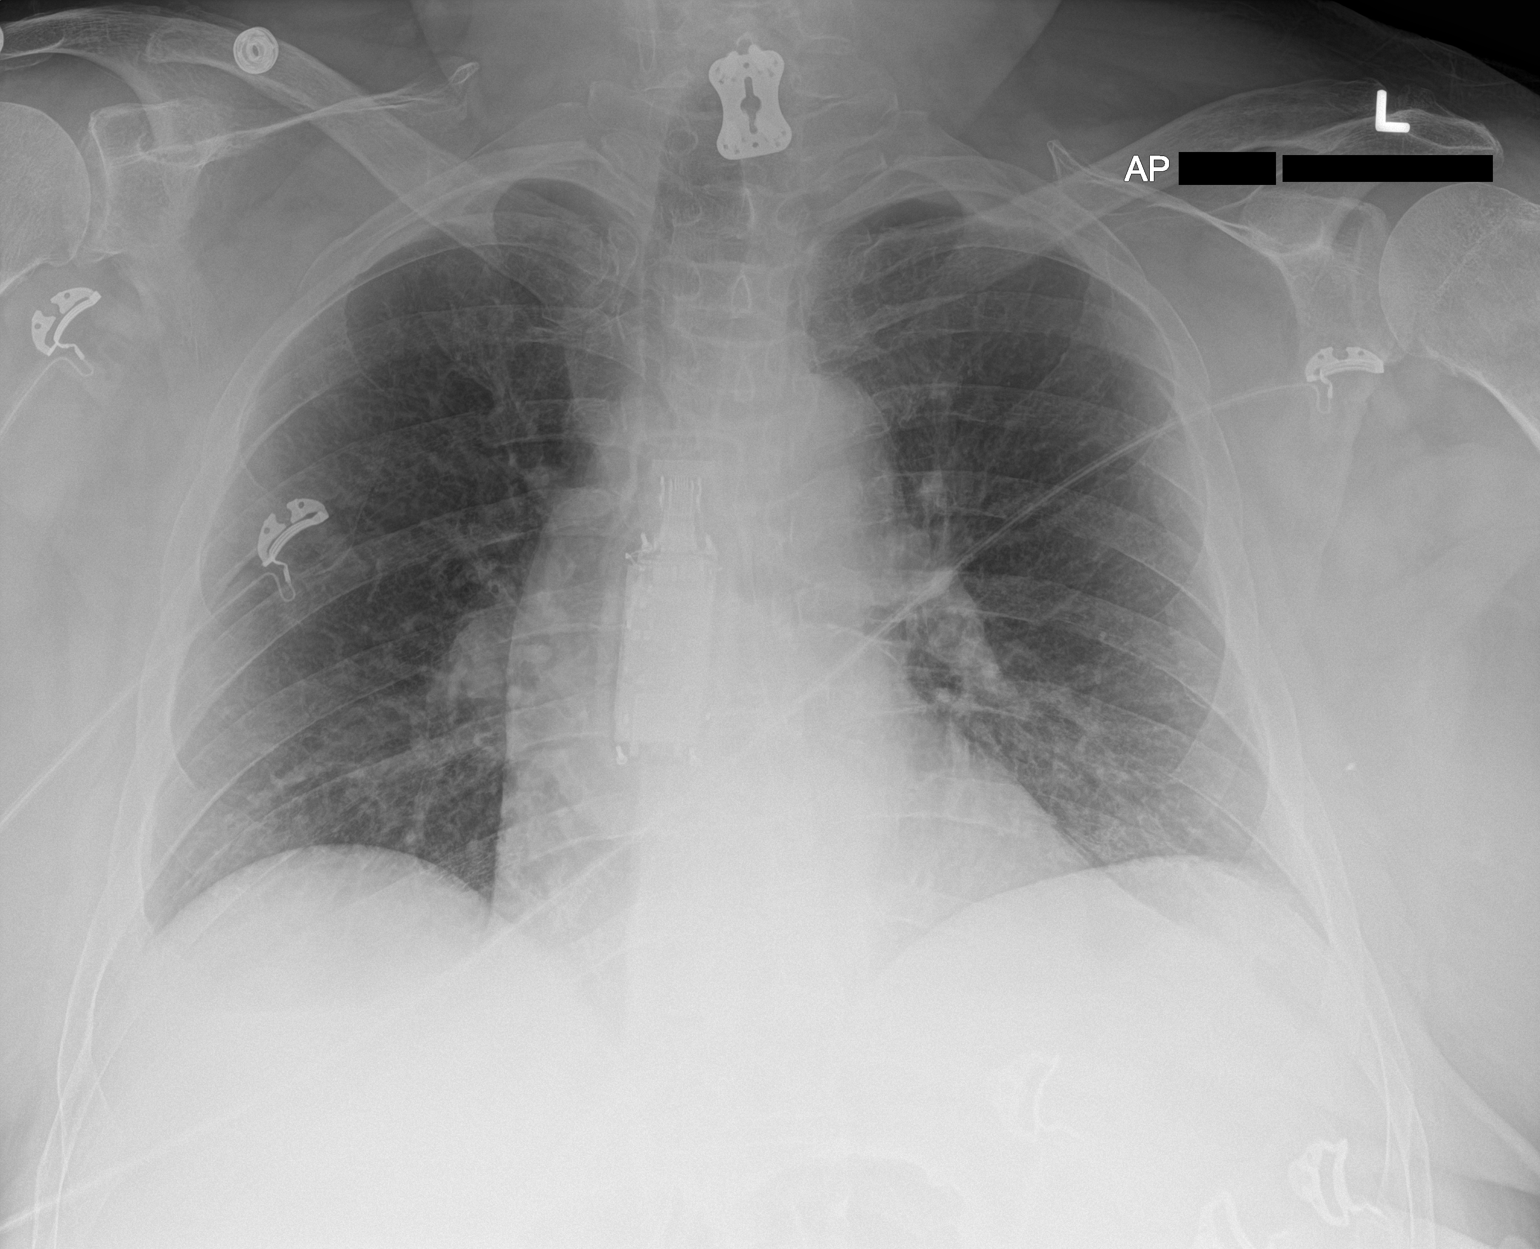

[1 of 1 positions shown; findings below may reference images not displayed]

FINDINGS: Single view of the chest was obtained. Electronic device overlying
the mid chest is new. Again noted is a surgical plate in the lower
cervical spine. The lungs are clear without pulmonary edema. Heart
size is within normal limits and stable. Negative for a
pneumothorax. No acute bone abnormality.
IMPRESSION: No acute chest findings.

## 2020-04-29 ENCOUNTER — Ambulatory Visit (INDEPENDENT_AMBULATORY_CARE_PROVIDER_SITE_OTHER): Payer: Medicare Other

## 2020-04-29 DIAGNOSIS — I441 Atrioventricular block, second degree: Secondary | ICD-10-CM | POA: Diagnosis not present

## 2020-04-29 LAB — CUP PACEART REMOTE DEVICE CHECK
Battery Remaining Longevity: 104 mo
Battery Remaining Percentage: 95.5 %
Battery Voltage: 3.01 V
Brady Statistic AP VP Percent: 1 %
Brady Statistic AP VS Percent: 1 %
Brady Statistic AS VP Percent: 80 %
Brady Statistic AS VS Percent: 19 %
Brady Statistic RA Percent Paced: 1 %
Brady Statistic RV Percent Paced: 81 %
Date Time Interrogation Session: 20211130020014
Implantable Lead Implant Date: 20201201
Implantable Lead Implant Date: 20201201
Implantable Lead Location: 753859
Implantable Lead Location: 753860
Implantable Pulse Generator Implant Date: 20201201
Lead Channel Impedance Value: 460 Ohm
Lead Channel Impedance Value: 580 Ohm
Lead Channel Pacing Threshold Amplitude: 0.75 V
Lead Channel Pacing Threshold Amplitude: 0.75 V
Lead Channel Pacing Threshold Pulse Width: 0.5 ms
Lead Channel Pacing Threshold Pulse Width: 0.5 ms
Lead Channel Sensing Intrinsic Amplitude: 2.6 mV
Lead Channel Sensing Intrinsic Amplitude: 5 mV
Lead Channel Setting Pacing Amplitude: 2 V
Lead Channel Setting Pacing Amplitude: 2.5 V
Lead Channel Setting Pacing Pulse Width: 0.5 ms
Lead Channel Setting Sensing Sensitivity: 2 mV
Pulse Gen Model: 2272
Pulse Gen Serial Number: 9182765

## 2020-05-03 ENCOUNTER — Emergency Department (HOSPITAL_COMMUNITY): Payer: Medicare Other

## 2020-05-03 ENCOUNTER — Emergency Department (HOSPITAL_COMMUNITY)
Admission: EM | Admit: 2020-05-03 | Discharge: 2020-05-03 | Disposition: A | Discharge status: Home / Self Care | Payer: Medicare Other | Attending: Emergency Medicine | Admitting: Emergency Medicine

## 2020-05-03 ENCOUNTER — Other Ambulatory Visit: Payer: Self-pay

## 2020-05-03 DIAGNOSIS — Z79899 Other long term (current) drug therapy: Secondary | ICD-10-CM | POA: Diagnosis not present

## 2020-05-03 DIAGNOSIS — I119 Hypertensive heart disease without heart failure: Secondary | ICD-10-CM | POA: Diagnosis not present

## 2020-05-03 DIAGNOSIS — S0101XA Laceration without foreign body of scalp, initial encounter: Secondary | ICD-10-CM | POA: Insufficient documentation

## 2020-05-03 DIAGNOSIS — Z7982 Long term (current) use of aspirin: Secondary | ICD-10-CM | POA: Insufficient documentation

## 2020-05-03 DIAGNOSIS — Z950 Presence of cardiac pacemaker: Secondary | ICD-10-CM | POA: Insufficient documentation

## 2020-05-03 DIAGNOSIS — J449 Chronic obstructive pulmonary disease, unspecified: Secondary | ICD-10-CM | POA: Diagnosis not present

## 2020-05-03 DIAGNOSIS — I2510 Atherosclerotic heart disease of native coronary artery without angina pectoris: Secondary | ICD-10-CM | POA: Diagnosis not present

## 2020-05-03 DIAGNOSIS — W208XXA Other cause of strike by thrown, projected or falling object, initial encounter: Secondary | ICD-10-CM | POA: Insufficient documentation

## 2020-05-03 DIAGNOSIS — S0990XA Unspecified injury of head, initial encounter: Secondary | ICD-10-CM

## 2020-05-03 DIAGNOSIS — E119 Type 2 diabetes mellitus without complications: Secondary | ICD-10-CM | POA: Diagnosis not present

## 2020-05-03 DIAGNOSIS — Z9861 Coronary angioplasty status: Secondary | ICD-10-CM | POA: Diagnosis not present

## 2020-05-03 DIAGNOSIS — Z87891 Personal history of nicotine dependence: Secondary | ICD-10-CM | POA: Diagnosis not present

## 2020-05-03 MED ORDER — BACITRACIN ZINC 500 UNIT/GM EX OINT
TOPICAL_OINTMENT | Freq: Once | CUTANEOUS | Status: AC
  Administered 2020-05-03: 1 via TOPICAL
  Filled 2020-05-03: qty 0.9

## 2020-05-03 MED ORDER — LIDOCAINE-EPINEPHRINE-TETRACAINE (LET) TOPICAL GEL
3.0000 mL | Freq: Once | TOPICAL | Status: AC
  Administered 2020-05-03: 3 mL via TOPICAL
  Filled 2020-05-03: qty 3

## 2020-05-03 MED ORDER — TETANUS-DIPHTH-ACELL PERTUSSIS 5-2.5-18.5 LF-MCG/0.5 IM SUSY
0.5000 mL | PREFILLED_SYRINGE | Freq: Once | INTRAMUSCULAR | Status: DC
  Filled 2020-05-03: qty 0.5

## 2020-05-03 MED ORDER — ONDANSETRON 4 MG PO TBDP
4.0000 mg | ORAL_TABLET | Freq: Once | ORAL | Status: AC
  Administered 2020-05-03: 4 mg via ORAL
  Filled 2020-05-03: qty 1

## 2020-05-03 MED ORDER — OXYCODONE-ACETAMINOPHEN 5-325 MG PO TABS
1.0000 | ORAL_TABLET | Freq: Once | ORAL | Status: AC
  Administered 2020-05-03: 1 via ORAL
  Filled 2020-05-03: qty 1

## 2020-05-03 NOTE — Discharge Instructions (Signed)
Keep the wound clean and dry for the first 24 hours. After that you may gently clean the wound with soap and water. Make sure to pat dry the wound before covering it with any dressing. You can use topical antibiotic ointment and bandage. Ice and elevate for pain relief.   You can take Tylenol or Ibuprofen as directed for pain. You can alternate Tylenol and Ibuprofen every 4 hours for additional pain relief.   Return to the Emergency Department, your primary care doctor, or the [facility] [place] in 7 days for suture removal.   Monitor closely for any signs of infection. Return to the Emergency Department for any worsening redness/swelling of the area that begins to spread, drainage from the site, worsening pain, fever or any other worsening or concerning symptoms.

## 2020-05-03 NOTE — ED Provider Notes (Signed)
[name] DEPT Provider Note   CSN: [id number] Arrival date & time: 05/03/20  1719     History Chief Complaint  Patient presents with  . Head Injury    Lee [name] is a 69 y.o. male who presents for evaluation of head injury.  He reports that about 130 today, he was outside and states that a 25 pound tree limb fell on top of his head.  He states he did not have any LOC.  He states he is not on blood thinners.  He states that he has been acting his normal self since this happened.  He states that the area cut bleeding.  He tried putting Steri-Strips on it but it did not stop.  He denies any numbness/weakness of his arms or legs, nausea/vomiting, neck pain.  The history is provided by the patient.       Past Medical History:  Diagnosis Date  . Allergic rhinitis    chronic sinusitis  . Arthritis   . CAD (coronary artery disease)    a. 2009 PCI/DES to mLAD; b. 05/2010 s/p DES RCA and LAD.; c. 05/2013 Cath: LAD mild ISR, LCX nl, RCA 40p, patent stents.  . Cataract    has had lasik surg  . COPD (chronic obstructive pulmonary disease) ([place])   . Diabetes mellitus type 2, controlled, without complications ([place])   . Diverticulosis   . DJD (degenerative joint disease)   . Esophagitis 1991   grade 1  . GERD (gastroesophageal reflux disease)    Hiatal hernia  . Hemorrhoids   . Hyperlipidemia   . Hypertensive heart disease   . Iron deficiency anemia   . Migraines   . Paroxysmal atrial fibrillation (HCC)    a. s/p afib ablation 10/22/08 (J. Allred).  . PVC's (premature ventricular contractions)   . Sleep apnea    Questionalble: RDI during the total sleep time 6h 28 mins was 3.55/hr during REM sleep was at 5.71/hr. Supine AHI was 5.93/hr.  . Syncope 08/2018    Patient Active Problem List   Diagnosis Date Noted  . Second degree Mobitz II AV block 04/30/2019  . Syncope and collapse 10/10/2018  . Heart block AV complete ([place]) 10/10/2018  .  Symptomatic bradycardia 10/10/2018  . Syncope, vasovagal 09/20/2018  . Neck mass 10/14/2016  . Depression 05/20/2016  . Hypertensive heart disease   . Paroxysmal atrial fibrillation (HCC)   . Hyperlipidemia LDL goal <70 03/27/2014  . Premature ventricular contraction 03/04/2014  . Chest pain with high risk of acute coronary syndrome 06/28/2013  . Morbid obesity ([place]) 06/28/2013  . OSA (obstructive sleep apnea) 12/18/2012  . Sleepiness 11/06/2012  . Fatigue 11/06/2012  . PALPITATIONS 06/02/2009  . ATRIAL FIBRILLATION 10/16/2008  . DYSLIPIDEMIA 09/05/2008  . Essential hypertension 09/05/2008  . CAD S/P percutaneous coronary angioplasty 09/05/2008  . RBBB 09/05/2008  . SUPRAVENTRICULAR TACHYCARDIA 09/05/2008  . ALLERGIC RHINITIS 09/05/2008  . GERD 09/05/2008  . BACK PAIN 09/05/2008  . ARRHYTHMIA, HX OF 09/05/2008  . MIGRAINES, HX OF 09/05/2008  . Dyslipidemia 09/05/2008    Past Surgical History:  Procedure Laterality Date  . CARDIAC CATHETERIZATION  05/2010   The proximal LAD was then predilated with 2.0 x 12 trek. this was then stented with a 2.5 x 16 promus Element drug-eluting stent at 14 atmosphere and prostdilated with 2.75 x 12 [place] trek at 16 atmospheres(2.8 mm) resulting the reduction of 80% proximal LAD stenosis to 0% residual with excellent flow.  Marland Kitchen CARDIAC CATHETERIZATION  06/05/2010   Successful percutaneous coronary intervention to the right coronary artery with percutaneous transluminal coronary angioplasty/stenting and insertion 3.0 x 16 mm Promus DES post dilated to 3.35 mm with stenosis being reduced to 0%  . CARDIAC CATHETERIZATION  02/2008   Post dilatation was performed using a 2.75 x 9 [place] sprinter, 10 atmospheres for 40 seconds and then 9 atmosphere for 35 sec. This resulted in the 80% area of narrowing pre-intervention, now appearing to normal. There was no edvidence of the dissection or thrombus and there was TIMI III flow pre and post.  . CARDIAC CATHETERIZATION   02/2006  . CORONARY ANGIOPLASTY WITH STENT PLACEMENT     3 stents/ 4 caths  . EYE SURGERY    . KNEE ARTHROSCOPY     right  . LASIK    . LEFT HEART CATHETERIZATION WITH CORONARY ANGIOGRAM N/A 06/29/2013   Procedure: LEFT HEART CATHETERIZATION WITH CORONARY ANGIOGRAM;  Surgeon: Leonie Man, MD;  [facility] C[facility] LAB;  Service: Cardiovascular;  Laterality: N/A;  . NASAL SINUS SURGERY  2001  . NECK SURGERY     Cervical fusion  . PACEMAKER IMPLANT N/A 05/01/2019   Procedure: PACEMAKER IMPLANT;  Surgeon: [name], MD;  Location: [facility] CV LAB;  Service: Cardiovascular;  Laterality: N/A;  . RADIOFREQUENCY ABLATION  May 2010   atrial fibrillation  . ROTATOR CUFF REPAIR     left  . SHOULDER OPEN ROTATOR CUFF REPAIR     right  . SPINE SURGERY    . WRIST ARTHROCENTESIS     left       Family History  Problem Relation Age of Onset  . Heart disease Father        and sister  . Diabetes Father   . Colon cancer Mother        mets from uterine  . Uterine cancer Mother   . Heart disease Sister     Social History   Tobacco Use  . Smoking status: Former Smoker    Quit date: 06/01/1987    Years since quitting: 32.9  . Smokeless tobacco: Never Used  Vaping Use  . Vaping Use: Never used  Substance Use Topics  . Alcohol use: Yes    Alcohol/week: 0.0 standard drinks    Comment: once every 6-7 months  . Drug use: No    Home Medications Prior to Admission medications   Medication Sig Start Date End Date Taking? Authorizing Provider  Accu-Chek FastClix Lancets MISC USE TO CHECK BLOOD SUGAR TWICE DAILY 04/21/20   Susy Frizzle, MD  acetaminophen (TYLENOL) 325 MG tablet Take 1-2 tablets (325-650 mg total) by mouth every 4 (four) hours as needed for mild pain. 05/02/19   Shirley Friar, PA-C  amLODipine (NORVASC) 10 MG tablet TAKE 1 TABLET(10 MG) BY MOUTH DAILY 09/24/19   Susy Frizzle, MD  aspirin EC 81 MG tablet Take 81 mg by mouth at bedtime.    [provider]  Blood Glucose Monitoring Suppl (ACCU-CHEK AVIVA PLUS) w/Device KIT Check BS bid E11.9 05/10/19   Susy Frizzle, MD  cholecalciferol (VITAMIN D) 1000 UNITS tablet Take 1,000 Units by mouth at bedtime.     [provider]  citalopram (CELEXA) 40 MG tablet TAKE 1 TABLET(40 MG) BY MOUTH DAILY 04/22/20   Susy Frizzle, MD  Coenzyme Q10 (CO Q 10) 100 MG CAPS Take 100 mg by mouth daily with breakfast.     [provider]  Erenumab-aooe ([place]) 140  MG/ML SOAJ Inject 140 mg into the skin every 30 (thirty) days. On or about the 1st of each month    [provider]  fexofenadine (ALLEGRA) 180 MG tablet Take 180 mg by mouth daily with breakfast.     [provider]  fluticasone (FLONASE) 50 MCG/ACT nasal spray INSTILL 2 SPRAYS IN THE  NOSE AT BEDTIME 04/06/17   Susy Frizzle, MD  furosemide (LASIX) 40 MG tablet TAKE 1 TABLET BY MOUTH  DAILY 09/17/19   Troy Sine, MD  glucose blood (ACCU-CHEK AVIVA PLUS) test strip Check BS bid E11.9 05/10/19   Susy Frizzle, MD  JARDIANCE 25 MG TABS tablet TAKE 1 TABLET BY MOUTH DAILY BEFORE BREAKFAST 04/11/20   Susy Frizzle, MD  Lancets Misc. (ACCU-CHEK FASTCLIX LANCET) KIT Check BS bid E11.9 05/10/19   Susy Frizzle, MD  Multiple Vitamin (MULTIVITAMIN WITH MINERALS) TABS tablet Take 1 tablet by mouth daily with breakfast.    [provider]  nitroGLYCERIN (NITROSTAT) 0.4 MG SL tablet PLACE ONE TABLET UNDER THE TONGUE EVERY 5 MINUTES AS NEEDED FOR CHEST PAIN 02/13/19   Troy Sine, MD  oxyCODONE-acetaminophen (PERCOCET) 10-325 MG tablet Take 1 tablet by mouth every 8 (eight) hours as needed for pain. 04/15/20   Susy Frizzle, MD  pantoprazole (PROTONIX) 40 MG tablet TAKE 1 TABLET BY MOUTH  DAILY 02/12/20   Troy Sine, MD  Polyvinyl Alcohol-Povidone (REFRESH OP) Place 1 drop into both eyes at bedtime as needed (dry eyes).    [provider]  potassium chloride  (MICRO-K) 10 MEQ CR capsule TAKE 1 CAPSULE BY MOUTH  DAILY 02/12/20   Troy Sine, MD  rosuvastatin (CRESTOR) 40 MG tablet TAKE 1 TABLET BY MOUTH  DAILY 08/22/19   Troy Sine, MD  telmisartan (MICARDIS) 40 MG tablet TAKE 1 TABLET BY MOUTH  DAILY 02/06/20   Troy Sine, MD  zolpidem (AMBIEN) 5 MG tablet Take 1 tablet (5 mg total) by mouth at bedtime as needed for sleep. 12/11/19   Troy Sine, MD    Allergies    Ibuprofen, Other, Topamax [topiramate], Toprol xl [metoprolol succinate], and Metoprolol-hctz er  Review of Systems   Review of Systems  Gastrointestinal: Negative for nausea and vomiting.  Skin: Positive for wound.  Neurological: Negative for weakness and numbness.  All other systems reviewed and are negative.   Physical Exam Updated Vital Signs BP (!) 176/97   Pulse 73   Temp 97.8 F (36.6 C) (Oral)   Resp 18   Ht '5\' 7"'  (1.702 m)   Wt 111.1 kg   SpO2 96%   BMI 38.37 kg/m   Physical Exam Vitals and nursing note reviewed.  Constitutional:      Appearance: He is well-developed.  HENT:     Head: Normocephalic and atraumatic.      Comments: 2cm laceration noted to posterior head. No skull deformity or crepitus noted.  Eyes:     General: No scleral icterus.       Right eye: No discharge.        Left eye: No discharge.     Conjunctiva/sclera: Conjunctivae normal.  Neck:     Comments: Full flexion/extension and lateral movement of neck fully intact. No bony midline tenderness. No deformities or crepitus.  Pulmonary:     Effort: Pulmonary effort is normal.  Skin:    General: Skin is warm and dry.  Neurological:     Mental Status: He is  alert.     Comments: Cranial nerves III-XII intact Follows commands, Moves all extremities  5/5 strength to BUE and BLE  Sensation intact throughout all major nerve distributions No slurred speech. No facial droop.   Psychiatric:        Speech: Speech normal.        Behavior: Behavior normal.     ED Results /  Procedures / Treatments   Labs (all labs ordered are listed, but only abnormal results are displayed) Labs Reviewed - No data to display  EKG None  Radiology CT Head Wo Contrast  Result Date: 05/03/2020 CLINICAL DATA:  Hit in the head by a tree limb EXAM: CT HEAD WITHOUT CONTRAST TECHNIQUE: Contiguous axial images were obtained from the base of the skull through the vertex without intravenous contrast. COMPARISON:  09/20/2018 FINDINGS: Brain: No acute infarct or hemorrhage. Lateral ventricles and midline structures are unremarkable. No acute extra-axial fluid collections. No mass effect. Vascular: No hyperdense vessel or unexpected calcification. Skull: Laceration left parietal convexity of the scalp. No underlying fracture. Remainder of the calvarium is unremarkable. Sinuses/Orbits: No acute finding. Other: None. IMPRESSION: 1. Scalp laceration at the left parietal convexity. 2. No acute intracranial process. Electronically Signed   By: Randa Ngo M.D.   On: 05/03/2020 20:35    Procedures .Marland KitchenLaceration Repair  Date/Time: 05/03/2020 9:19 PM Performed by: Volanda Napoleon, PA-C Authorized by: Volanda Napoleon, PA-C   Consent:    Consent obtained:  Verbal   Consent given by:  Patient   Risks discussed:  Infection, need for additional repair, pain, poor cosmetic result and poor wound healing   Alternatives discussed:  No treatment and delayed treatment Universal protocol:    Procedure explained and questions answered to patient or proxy's satisfaction: yes     Relevant documents present and verified: yes     Test results available and properly labeled: yes     Imaging studies available: yes     Required blood products, implants, devices, and special equipment available: yes     Site/side marked: yes     Immediately prior to procedure, a time out was called: yes     Patient identity confirmed:  Verbally with patient Anesthesia (see MAR for exact dosages):    Anesthesia method:   Topical application   Topical anesthetic:  LET Laceration details:    Location:  Scalp   Scalp location: Posterior.   Length (cm):  2 Repair type:    Repair type:  Simple Pre-procedure details:    Preparation:  Patient was prepped and draped in usual sterile fashion Exploration:    Hemostasis achieved with:  Direct pressure   Wound extent: no foreign bodies/material noted   Treatment:    Area cleansed with:  Betadine and saline   Irrigation solution:  Sterile saline   Visualized foreign bodies/material removed: no   Skin repair:    Repair method:  Staples   Number of staples:  4 Approximation:    Approximation:  Close Post-procedure details:    Dressing:  Antibiotic ointment   Patient tolerance of procedure:  Tolerated well, no immediate complications   (including critical care time)  Medications Ordered in ED Medications  Tdap (BOOSTRIX) injection 0.5 mL (0 mLs Intramuscular Hold 05/03/20 2111)  lidocaine-EPINEPHrine-tetracaine (LET) topical gel (3 mLs Topical Given 05/03/20 2107)  oxyCODONE-acetaminophen (PERCOCET/ROXICET) 5-325 MG per tablet 1 tablet (1 tablet Oral Given 05/03/20 2209)  ondansetron (ZOFRAN-ODT) disintegrating tablet 4 mg (4 mg Oral Given 05/03/20 2209)  bacitracin ointment (1 application Topical Given 05/03/20 2209)    ED Course  I have reviewed the triage vital signs and the nursing notes.  Pertinent labs & imaging results that were available during my care of the patient were reviewed by me and considered in my medical decision making (see chart for details).    MDM Rules/Calculators/A&P                          68 year old male who presents for evaluation of head injury that occurred approxi-1:30 PM this afternoon.  Reports that a 25 pound tree branch fell on his head.  No LOC.  He is not on blood thinners.  On initial arrival, he is afebrile nontoxic-appearing.  Vital signs are stable.  On exam, he has a 2 cm laceration to posterior scalp.  No  underlying skull deformity or crepitus noted.  No neck pain.  Neuro exam is normal.  I discussed with him regarding imaging in his head.  He did not have any LOC and is on blood thinners but he is 69 and he did have a significant tree limb fall on his head.  We discussed risk first benefits of declining a head CT.  After engaging in shared decision-making, patient agreed with plan for CT head.  I feel this is reasonable.  Patient with no neck tenderness.  No indication for C-spine imaging.  Patient's tetanus is up-to-date.  CT head shows no evidence of acute intracranial normality.  Laceration repaired as documented above.  Patient tolerated procedure well.   Discussed results with patient.  Encouraged at home supportive care measures. At this time, patient exhibits no emergent life-threatening condition that require further evaluation in ED. Patient had ample opportunity for questions and discussion. All patient's questions were answered with full understanding. Strict return precautions discussed. Patient expresses understanding and agreement to plan.   Portions of this note were generated with Lobbyist. Dictation errors may occur despite best attempts at proofreading.   Final Clinical Impression(s) / ED Diagnoses Final diagnoses:  Injury of head, initial encounter  Laceration of scalp, initial encounter    Rx / DC Orders ED Discharge Orders    None       Desma Mcgregor 05/03/20 2349    Davonna Belling, MD 05/03/20 2352

## 2020-05-03 NOTE — ED Triage Notes (Addendum)
Pt reports tree limb fell on top of head appx 130 today, unable to control bleeding at home.  Pt denies LOC, denies taking blood thinners.  Pt arrives with steristrips in place, bleeding controlled.  Pt denies n/v/blurred vision.   Pt reports taking oxycodone for pain appx 1400 today, denies pain at this time.  AOx4.

## 2020-05-05 NOTE — Progress Notes (Signed)
Remote pacemaker transmission.   

## 2020-05-12 ENCOUNTER — Ambulatory Visit (INDEPENDENT_AMBULATORY_CARE_PROVIDER_SITE_OTHER): Payer: Medicare Other | Admitting: Family Medicine

## 2020-05-12 ENCOUNTER — Other Ambulatory Visit: Payer: Self-pay

## 2020-05-12 VITALS — BP 140/80 | HR 80 | Resp 18 | Wt 251.0 lb

## 2020-05-12 DIAGNOSIS — Z4802 Encounter for removal of sutures: Secondary | ICD-10-CM | POA: Diagnosis not present

## 2020-05-12 DIAGNOSIS — E118 Type 2 diabetes mellitus with unspecified complications: Secondary | ICD-10-CM

## 2020-05-12 NOTE — Progress Notes (Signed)
Subjective:    Patient ID: Lee [name], male    DOB: August 13, 1950, 69 y.o.   MRN: [id number]  HPI  1 week ago, the patient had a limb fall and strike him on the crown of his head.  This occurred last Saturday.  He had to go the emergency room where the laceration on the crown of his head was closed with 4 staples.  He is here today for staple removal.  He denied any loss of consciousness.  He denies any symptoms consistent with a concussion such as fogginess or confusion.  He denies any headaches after the fact or neurologic deficits after the fact.  The laceration appears well-healed.  There is no erythema or exudate.  He is overdue for lab work Past Medical History:  Diagnosis Date  . Allergic rhinitis    chronic sinusitis  . Arthritis   . CAD (coronary artery disease)    a. 2009 PCI/DES to mLAD; b. 05/2010 s/p DES RCA and LAD.; c. 05/2013 Cath: LAD mild ISR, LCX nl, RCA 40p, patent stents.  . Cataract    has had lasik surg  . COPD (chronic obstructive pulmonary disease) ([place])   . Diabetes mellitus type 2, controlled, without complications ([place])   . Diverticulosis   . DJD (degenerative joint disease)   . Esophagitis 1991   grade 1  . GERD (gastroesophageal reflux disease)    Hiatal hernia  . Hemorrhoids   . Hyperlipidemia   . Hypertensive heart disease   . Iron deficiency anemia   . Migraines   . Paroxysmal atrial fibrillation (HCC)    a. s/p afib ablation 10/22/08 (J. Allred).  . PVC's (premature ventricular contractions)   . Sleep apnea    Questionalble: RDI during the total sleep time 6h 28 mins was 3.55/hr during REM sleep was at 5.71/hr. Supine AHI was 5.93/hr.  . Syncope 08/2018   Past Surgical History:  Procedure Laterality Date  . CARDIAC CATHETERIZATION  05/2010   The proximal LAD was then predilated with 2.0 x 12 trek. this was then stented with a 2.5 x 16 promus Element drug-eluting stent at 14 atmosphere and prostdilated with 2.75 x 12 [place] trek at 16  atmospheres(2.8 mm) resulting the reduction of 80% proximal LAD stenosis to 0% residual with excellent flow.  Marland Kitchen CARDIAC CATHETERIZATION  06/05/2010   Successful percutaneous coronary intervention to the right coronary artery with percutaneous transluminal coronary angioplasty/stenting and insertion 3.0 x 16 mm Promus DES post dilated to 3.35 mm with stenosis being reduced to 0%  . CARDIAC CATHETERIZATION  02/2008   Post dilatation was performed using a 2.75 x 9 [place] sprinter, 10 atmospheres for 40 seconds and then 9 atmosphere for 35 sec. This resulted in the 80% area of narrowing pre-intervention, now appearing to normal. There was no edvidence of the dissection or thrombus and there was TIMI III flow pre and post.  . CARDIAC CATHETERIZATION  02/2006  . CORONARY ANGIOPLASTY WITH STENT PLACEMENT     3 stents/ 4 caths  . EYE SURGERY    . KNEE ARTHROSCOPY     right  . LASIK    . LEFT HEART CATHETERIZATION WITH CORONARY ANGIOGRAM N/A 06/29/2013   Procedure: LEFT HEART CATHETERIZATION WITH CORONARY ANGIOGRAM;  Surgeon: Leonie Man, MD;  Location: [facility] CATH LAB;  Service: Cardiovascular;  Laterality: N/A;  . NASAL SINUS SURGERY  2001  . NECK SURGERY     Cervical fusion  . PACEMAKER IMPLANT N/A 05/01/2019  Procedure: PACEMAKER IMPLANT;  Surgeon: [name], MD;  Location: [facility] CV LAB;  Service: Cardiovascular;  Laterality: N/A;  . RADIOFREQUENCY ABLATION  May 2010   atrial fibrillation  . ROTATOR CUFF REPAIR     left  . SHOULDER OPEN ROTATOR CUFF REPAIR     right  . SPINE SURGERY    . WRIST ARTHROCENTESIS     left   Current Outpatient Medications on File Prior to Visit  Medication Sig Dispense Refill  . Accu-Chek FastClix Lancets MISC USE TO CHECK BLOOD SUGAR TWICE DAILY 102 each 3  . acetaminophen (TYLENOL) 325 MG tablet Take 1-2 tablets (325-650 mg total) by mouth every 4 (four) hours as needed for mild pain.    Marland Kitchen amLODipine (NORVASC) 10 MG tablet TAKE 1 TABLET(10 MG) BY MOUTH  DAILY 90 tablet 3  . aspirin EC 81 MG tablet Take 81 mg by mouth at bedtime.    . Blood Glucose Monitoring Suppl (ACCU-CHEK AVIVA PLUS) w/Device KIT Check BS bid E11.9 1 kit 1  . cholecalciferol (VITAMIN D) 1000 UNITS tablet Take 1,000 Units by mouth at bedtime.     . citalopram (CELEXA) 40 MG tablet TAKE 1 TABLET(40 MG) BY MOUTH DAILY 90 tablet 1  . Coenzyme Q10 (CO Q 10) 100 MG CAPS Take 100 mg by mouth daily with breakfast.     . Erenumab-aooe (AIMOVIG) 140 MG/ML SOAJ Inject 140 mg into the skin every 30 (thirty) days. On or about the 1st of each month    . fexofenadine (ALLEGRA) 180 MG tablet Take 180 mg by mouth daily with breakfast.     . fluticasone (FLONASE) 50 MCG/ACT nasal spray INSTILL 2 SPRAYS IN THE  NOSE AT BEDTIME 48 g 3  . furosemide (LASIX) 40 MG tablet TAKE 1 TABLET BY MOUTH  DAILY 90 tablet 3  . glucose blood (ACCU-CHEK AVIVA PLUS) test strip Check BS bid E11.9 100 each 12  . JARDIANCE 25 MG TABS tablet TAKE 1 TABLET BY MOUTH DAILY BEFORE BREAKFAST 30 tablet 5  . Lancets Misc. (ACCU-CHEK FASTCLIX LANCET) KIT Check BS bid E11.9 1 kit 1  . Multiple Vitamin (MULTIVITAMIN WITH MINERALS) TABS tablet Take 1 tablet by mouth daily with breakfast.    . nitroGLYCERIN (NITROSTAT) 0.4 MG SL tablet PLACE ONE TABLET UNDER THE TONGUE EVERY 5 MINUTES AS NEEDED FOR CHEST PAIN 25 tablet 3  . oxyCODONE-acetaminophen (PERCOCET) 10-325 MG tablet Take 1 tablet by mouth every 8 (eight) hours as needed for pain. 21 tablet 0  . pantoprazole (PROTONIX) 40 MG tablet TAKE 1 TABLET BY MOUTH  DAILY 90 tablet 1  . Polyvinyl Alcohol-Povidone (REFRESH OP) Place 1 drop into both eyes at bedtime as needed (dry eyes).    . potassium chloride (MICRO-K) 10 MEQ CR capsule TAKE 1 CAPSULE BY MOUTH  DAILY 90 capsule 1  . rosuvastatin (CRESTOR) 40 MG tablet TAKE 1 TABLET BY MOUTH  DAILY 90 tablet 3  . telmisartan (MICARDIS) 40 MG tablet TAKE 1 TABLET BY MOUTH  DAILY 90 tablet 3  . zolpidem (AMBIEN) 5 MG tablet Take 1  tablet (5 mg total) by mouth at bedtime as needed for sleep. 30 tablet 3   No current facility-administered medications on file prior to visit.   Allergies  Allergen Reactions  . Ibuprofen Swelling    Whole body swelled  . Other Shortness Of Breath    BETA BLOCKERS  . Topamax [Topiramate] Other (See Comments)    Pt states it made his tongue  feel "scalded" and he couldn't eat   . Toprol Xl [Metoprolol Succinate] Other (See Comments)    Personality change  . Metoprolol-Hctz Er Other (See Comments)    Indigestion,mouth raw   Social History   Socioeconomic History  . Marital status: Married    Spouse name: Not on file  . Number of children: 2  . Years of education: Not on file  . Highest education level: Not on file  Occupational History  . Occupation: retired  Tobacco Use  . Smoking status: Former Smoker    Quit date: 06/01/1987    Years since quitting: 32.9  . Smokeless tobacco: Never Used  Vaping Use  . Vaping Use: Never used  Substance and Sexual Activity  . Alcohol use: Yes    Alcohol/week: 0.0 standard drinks    Comment: once every 6-7 months  . Drug use: No  . Sexual activity: Not on file  Other Topics Concern  . Not on file  Social History Narrative  . Not on file   Social Determinants of Health   Financial Resource Strain: Not on file  Food Insecurity: Not on file  Transportation Needs: Not on file  Physical Activity: Not on file  Stress: Not on file  Social Connections: Not on file  Intimate Partner Violence: Not on file      Review of Systems  All other systems reviewed and are negative.      Objective:   Physical Exam Vitals reviewed.  Constitutional:      General: He is not in acute distress.    Appearance: He is not ill-appearing, toxic-appearing or diaphoretic.  HENT:     Head: Laceration present.   Cardiovascular:     Rate and Rhythm: Normal rate.     Heart sounds: Normal heart sounds.  Pulmonary:     Effort: Pulmonary effort is  normal. No tachypnea or respiratory distress.     Breath sounds: No stridor.  Musculoskeletal:     Right lower leg: No edema.     Left lower leg: No edema.  Neurological:     Mental Status: He is alert.           Assessment & Plan:  Type 2 diabetes mellitus with complication, without long-term current use of insulin (HCC) - Plan: Hemoglobin A1c, COMPLETE METABOLIC PANEL WITH GFR  Encounter for staple removal  4 staples removed without difficulty.  While the patient is here today, I will check a CMP and an A1c as well.  Goal A1c is less than 6.5

## 2020-05-13 LAB — COMPLETE METABOLIC PANEL WITH GFR
AG Ratio: 1.9 (calc) (ref 1.0–2.5)
ALT: 19 U/L (ref 9–46)
AST: 16 U/L (ref 10–35)
Albumin: 4.3 g/dL (ref 3.6–5.1)
Alkaline phosphatase (APISO): 45 U/L (ref 35–144)
BUN: 18 mg/dL (ref 7–25)
CO2: 28 mmol/L (ref 20–32)
Calcium: 9.6 mg/dL (ref 8.6–10.3)
Chloride: 99 mmol/L (ref 98–110)
Creat: 0.88 mg/dL (ref 0.70–1.18)
GFR, Est African American: 101 mL/min/{1.73_m2} (ref >=60)
GFR, Est Non African American: 87 mL/min/{1.73_m2} (ref >=60)
Globulin: 2.3 g/dL (calc) (ref 1.9–3.7)
Glucose, Bld: 206 mg/dL — ABNORMAL HIGH (ref 65–99)
Potassium: 4.6 mmol/L (ref 3.5–5.3)
Sodium: 135 mmol/L (ref 135–146)
Total Bilirubin: 0.4 mg/dL (ref 0.2–1.2)
Total Protein: 6.6 g/dL (ref 6.1–8.1)

## 2020-05-13 LAB — HEMOGLOBIN A1C
Hgb A1c MFr Bld: 6.9 % of total Hgb — ABNORMAL HIGH (ref <5.7)
Mean Plasma Glucose: 151 mg/dL
eAG (mmol/L): 8.4 mmol/L

## 2020-05-26 ENCOUNTER — Other Ambulatory Visit: Payer: Self-pay | Admitting: Family Medicine

## 2020-06-10 ENCOUNTER — Telehealth: Payer: Self-pay

## 2020-06-10 NOTE — Telephone Encounter (Signed)
PA for zolpidem DENIED by [facility] due to not meeting requirement of having tried and failed Belsomra and generic ramelteon. Will forward to MD for potential alternatives.

## 2020-06-10 NOTE — Telephone Encounter (Signed)
Received letter from [employer] stating that beginning May 31, 2020, zolpidem 5 mg will require prior authorization in order to be covered by plan. PA filled out via [facility] Cover My Meds link:  Key: [id number] Case ID: [id number]

## 2020-06-13 NOTE — Telephone Encounter (Signed)
Will route to RN for follow up

## 2020-07-09 NOTE — Telephone Encounter (Signed)
Contacted wife and informed MD recommend [employer] since insurance denied coverage. Wife verbalized understanding.

## 2020-07-23 ENCOUNTER — Other Ambulatory Visit: Payer: Self-pay | Admitting: Family Medicine

## 2020-07-29 ENCOUNTER — Ambulatory Visit (INDEPENDENT_AMBULATORY_CARE_PROVIDER_SITE_OTHER): Payer: Medicare Other

## 2020-07-29 DIAGNOSIS — I441 Atrioventricular block, second degree: Secondary | ICD-10-CM

## 2020-07-29 LAB — CUP PACEART REMOTE DEVICE CHECK
Battery Remaining Longevity: 110 mo
Battery Remaining Percentage: 95.5 %
Battery Voltage: 3.01 V
Brady Statistic AP VP Percent: 1 %
Brady Statistic AP VS Percent: 1 %
Brady Statistic AS VP Percent: 80 %
Brady Statistic AS VS Percent: 19 %
Brady Statistic RA Percent Paced: 1 %
Brady Statistic RV Percent Paced: 81 %
Date Time Interrogation Session: 20220301020012
Implantable Lead Implant Date: 20201201
Implantable Lead Implant Date: 20201201
Implantable Lead Location: 753859
Implantable Lead Location: 753860
Implantable Pulse Generator Implant Date: 20201201
Lead Channel Impedance Value: 480 Ohm
Lead Channel Impedance Value: 510 Ohm
Lead Channel Pacing Threshold Amplitude: 0.75 V
Lead Channel Pacing Threshold Amplitude: 0.75 V
Lead Channel Pacing Threshold Pulse Width: 0.5 ms
Lead Channel Pacing Threshold Pulse Width: 0.5 ms
Lead Channel Sensing Intrinsic Amplitude: 2.1 mV
Lead Channel Sensing Intrinsic Amplitude: 4.2 mV
Lead Channel Setting Pacing Amplitude: 2 V
Lead Channel Setting Pacing Amplitude: 2.5 V
Lead Channel Setting Pacing Pulse Width: 0.5 ms
Lead Channel Setting Sensing Sensitivity: 2 mV
Pulse Gen Model: 2272
Pulse Gen Serial Number: 9182765

## 2020-08-06 NOTE — Progress Notes (Signed)
Remote pacemaker transmission.   

## 2020-08-12 ENCOUNTER — Other Ambulatory Visit: Payer: Self-pay

## 2020-08-12 ENCOUNTER — Encounter: Payer: Self-pay | Admitting: Podiatry

## 2020-08-12 ENCOUNTER — Ambulatory Visit (INDEPENDENT_AMBULATORY_CARE_PROVIDER_SITE_OTHER): Payer: Medicare Other | Admitting: Podiatry

## 2020-08-12 DIAGNOSIS — Q828 Other specified congenital malformations of skin: Secondary | ICD-10-CM

## 2020-08-12 NOTE — Progress Notes (Signed)
He presents today for his subfifth metatarsal head porokeratotic lesions.  He states that the areas beneath the heads have not been as bad but the one beneath the base of the fifth met right is most painful.  Objective: Vital signs are stable he is alert oriented x3 subfifth metatarsal head calluses are not nearly as reactive as they have been in the past.  He does however have one that is quite painful thick and large beneath the fifth met base right.  There is no erythema cellulitis drainage or odor I remove the nucleus of the lesion today carefully debrided all other reactive hyperkeratosis also.  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  Plan: I debrided reactive hyperkeratotic lesion placed salicylic acid under occlusion to be left on for 3 days then washed off thoroughly I will follow-up with him in a few months.

## 2020-08-18 ENCOUNTER — Other Ambulatory Visit: Payer: Self-pay

## 2020-08-18 ENCOUNTER — Other Ambulatory Visit: Payer: Medicare Other

## 2020-08-18 DIAGNOSIS — E785 Hyperlipidemia, unspecified: Secondary | ICD-10-CM

## 2020-08-18 DIAGNOSIS — Z125 Encounter for screening for malignant neoplasm of prostate: Secondary | ICD-10-CM

## 2020-08-18 DIAGNOSIS — E118 Type 2 diabetes mellitus with unspecified complications: Secondary | ICD-10-CM

## 2020-08-18 DIAGNOSIS — I10 Essential (primary) hypertension: Secondary | ICD-10-CM

## 2020-08-19 LAB — CBC WITH DIFFERENTIAL/PLATELET
Absolute Monocytes: 599 cells/uL (ref 200–950)
Basophils Absolute: 58 cells/uL (ref 0–200)
Basophils Relative: 0.8 %
Eosinophils Absolute: 190 cells/uL (ref 15–500)
Eosinophils Relative: 2.6 %
HCT: 48 % (ref 38.5–50.0)
Hemoglobin: 16 g/dL (ref 13.2–17.1)
Lymphs Abs: 2110 cells/uL (ref 850–3900)
MCH: 29.9 pg (ref 27.0–33.0)
MCHC: 33.3 g/dL (ref 32.0–36.0)
MCV: 89.6 fL (ref 80.0–100.0)
MPV: 9.2 fL (ref 7.5–12.5)
Monocytes Relative: 8.2 %
Neutro Abs: 4344 cells/uL (ref 1500–7800)
Neutrophils Relative %: 59.5 %
Platelets: 219 10*3/uL (ref 140–400)
RBC: 5.36 10*6/uL (ref 4.20–5.80)
RDW: 11.8 % (ref 11.0–15.0)
Total Lymphocyte: 28.9 %
WBC: 7.3 10*3/uL (ref 3.8–10.8)

## 2020-08-19 LAB — LIPID PANEL
Cholesterol: 128 mg/dL (ref <200)
HDL: 51 mg/dL (ref >=40)
LDL Cholesterol (Calc): 61 mg/dL (calc)
Non-HDL Cholesterol (Calc): 77 mg/dL (calc) (ref <130)
Total CHOL/HDL Ratio: 2.5 (calc) (ref <5.0)
Triglycerides: 81 mg/dL (ref <150)

## 2020-08-19 LAB — COMPLETE METABOLIC PANEL WITH GFR
AG Ratio: 1.9 (calc) (ref 1.0–2.5)
ALT: 16 U/L (ref 9–46)
AST: 17 U/L (ref 10–35)
Albumin: 4.4 g/dL (ref 3.6–5.1)
Alkaline phosphatase (APISO): 49 U/L (ref 35–144)
BUN: 14 mg/dL (ref 7–25)
CO2: 26 mmol/L (ref 20–32)
Calcium: 9.6 mg/dL (ref 8.6–10.3)
Chloride: 101 mmol/L (ref 98–110)
Creat: 0.85 mg/dL (ref 0.70–1.18)
GFR, Est African American: 102 mL/min/{1.73_m2} (ref >=60)
GFR, Est Non African American: 88 mL/min/{1.73_m2} (ref >=60)
Globulin: 2.3 g/dL (calc) (ref 1.9–3.7)
Glucose, Bld: 122 mg/dL — ABNORMAL HIGH (ref 65–99)
Potassium: 4.5 mmol/L (ref 3.5–5.3)
Sodium: 139 mmol/L (ref 135–146)
Total Bilirubin: 0.5 mg/dL (ref 0.2–1.2)
Total Protein: 6.7 g/dL (ref 6.1–8.1)

## 2020-08-19 LAB — HEMOGLOBIN A1C
Hgb A1c MFr Bld: 6.6 % of total Hgb — ABNORMAL HIGH (ref <5.7)
Mean Plasma Glucose: 143 mg/dL
eAG (mmol/L): 7.9 mmol/L

## 2020-08-19 LAB — PSA: PSA: 0.83 ng/mL (ref <=4.0)

## 2020-08-20 ENCOUNTER — Other Ambulatory Visit: Payer: Self-pay | Admitting: Cardiovascular Disease

## 2020-08-25 ENCOUNTER — Ambulatory Visit (INDEPENDENT_AMBULATORY_CARE_PROVIDER_SITE_OTHER): Payer: Medicare Other | Admitting: Family Medicine

## 2020-08-25 ENCOUNTER — Encounter: Payer: Self-pay | Admitting: Family Medicine

## 2020-08-25 ENCOUNTER — Other Ambulatory Visit: Payer: Self-pay

## 2020-08-25 VITALS — BP 132/74 | HR 60 | Temp 98.7°F | Resp 14 | Ht 68.0 in | Wt 249.0 lb

## 2020-08-25 DIAGNOSIS — E118 Type 2 diabetes mellitus with unspecified complications: Secondary | ICD-10-CM | POA: Diagnosis not present

## 2020-08-25 DIAGNOSIS — Z0001 Encounter for general adult medical examination with abnormal findings: Secondary | ICD-10-CM

## 2020-08-25 DIAGNOSIS — Z0000 Encounter for general adult medical examination without abnormal findings: Secondary | ICD-10-CM

## 2020-08-25 DIAGNOSIS — I10 Essential (primary) hypertension: Secondary | ICD-10-CM

## 2020-08-25 DIAGNOSIS — I2510 Atherosclerotic heart disease of native coronary artery without angina pectoris: Secondary | ICD-10-CM

## 2020-08-25 DIAGNOSIS — Z9861 Coronary angioplasty status: Secondary | ICD-10-CM

## 2020-08-25 DIAGNOSIS — E785 Hyperlipidemia, unspecified: Secondary | ICD-10-CM

## 2020-08-25 DIAGNOSIS — I480 Paroxysmal atrial fibrillation: Secondary | ICD-10-CM

## 2020-08-25 NOTE — Progress Notes (Signed)
Subjective:    Patient ID: Lee [name], male    DOB: May 24, 1951, 70 y.o.   MRN: [id number]  HPI  Patient is a very pleasant 70 year old Caucasian male here today for complete physical exam.  He seems to be doing much better since having his pacemaker.  He denies any more syncopal events.  He denies any arrhythmias.  He denies any chest pain or shortness of breath.  His last colonoscopy was in 2014 and is due again in 2024.  His most recent PSA was outstanding and therefore his prostate cancer screening is up-to-date.  He denies any issues with falls, depression, or memory loss.  He is currently being treated for depression with Celexa and it seems to be working well.  His most recent lab work is listed below: Lab on 08/18/2020  Component Date Value Ref Range Status  . WBC 08/18/2020 7.3  3.8 - 10.8 Thousand/uL Final  . RBC 08/18/2020 5.36  4.20 - 5.80 Million/uL Final  . Hemoglobin 08/18/2020 16.0  13.2 - 17.1 g/dL Final  . HCT 08/18/2020 48.0  38.5 - 50.0 % Final  . MCV 08/18/2020 89.6  80.0 - 100.0 fL Final  . MCH 08/18/2020 29.9  27.0 - 33.0 pg Final  . MCHC 08/18/2020 33.3  32.0 - 36.0 g/dL Final  . RDW 08/18/2020 11.8  11.0 - 15.0 % Final  . Platelets 08/18/2020 219  140 - 400 Thousand/uL Final  . MPV 08/18/2020 9.2  7.5 - 12.5 fL Final  . Neutro Abs 08/18/2020 4,344  1,500 - 7,800 cells/uL Final  . Lymphs Abs 08/18/2020 2,110  850 - 3,900 cells/uL Final  . Absolute Monocytes 08/18/2020 599  200 - 950 cells/uL Final  . Eosinophils Absolute 08/18/2020 190  15 - 500 cells/uL Final  . Basophils Absolute 08/18/2020 58  0 - 200 cells/uL Final  . Neutrophils Relative % 08/18/2020 59.5  % Final  . Total Lymphocyte 08/18/2020 28.9  % Final  . Monocytes Relative 08/18/2020 8.2  % Final  . Eosinophils Relative 08/18/2020 2.6  % Final  . Basophils Relative 08/18/2020 0.8  % Final  . Hgb A1c MFr Bld 08/18/2020 6.6* <5.7 % of total Hgb Final   Comment: For someone without known  diabetes, a hemoglobin A1c value of 6.5% or greater indicates that they may have  diabetes and this should be confirmed with a follow-up  test. . For someone with known diabetes, a value <7% indicates  that their diabetes is well controlled and a value  greater than or equal to 7% indicates suboptimal  control. A1c targets should be individualized based on  duration of diabetes, age, comorbid conditions, and  other considerations. . Currently, no consensus exists regarding use of hemoglobin A1c for diagnosis of diabetes for children. .   . Mean Plasma Glucose 08/18/2020 143  mg/dL Final  . eAG (mmol/L) 08/18/2020 7.9  mmol/L Final  . Glucose, Bld 08/18/2020 122* 65 - 99 mg/dL Final   Comment: .            Fasting reference interval . For someone without known diabetes, a glucose value between 100 and 125 mg/dL is consistent with prediabetes and should be confirmed with a follow-up test. .   . BUN 08/18/2020 14  7 - 25 mg/dL Final  . Creat 08/18/2020 0.85  0.70 - 1.18 mg/dL Final   Comment: For patients >66 years of age, the reference limit for Creatinine is approximately 13% higher for people identified as African-American. Marland Kitchen   Marland Kitchen  GFR, Est Non African American 08/18/2020 88  > OR = 60 mL/min/1.69m Final  . GFR, Est African American 08/18/2020 102  > OR = 60 mL/min/1.754mFinal  . BUN/Creatinine Ratio 0374/16/3845OT APPLICABLE  6 - 22 (calc) Final  . Sodium 08/18/2020 139  135 - 146 mmol/L Final  . Potassium 08/18/2020 4.5  3.5 - 5.3 mmol/L Final  . Chloride 08/18/2020 101  98 - 110 mmol/L Final  . CO2 08/18/2020 26  20 - 32 mmol/L Final  . Calcium 08/18/2020 9.6  8.6 - 10.3 mg/dL Final  . Total Protein 08/18/2020 6.7  6.1 - 8.1 g/dL Final  . Albumin 08/18/2020 4.4  3.6 - 5.1 g/dL Final  . Globulin 08/18/2020 2.3  1.9 - 3.7 g/dL (calc) Final  . AG Ratio 08/18/2020 1.9  1.0 - 2.5 (calc) Final  . Total Bilirubin 08/18/2020 0.5  0.2 - 1.2 mg/dL Final  . Alkaline  phosphatase (APISO) 08/18/2020 49  35 - 144 U/L Final  . AST 08/18/2020 17  10 - 35 U/L Final  . ALT 08/18/2020 16  9 - 46 U/L Final  . Cholesterol 08/18/2020 128  <200 mg/dL Final  . HDL 08/18/2020 51  > OR = 40 mg/dL Final  . Triglycerides 08/18/2020 81  <150 mg/dL Final  . LDL Cholesterol (Calc) 08/18/2020 61  mg/dL (calc) Final   Comment: Reference range: <100 . Desirable range <100 mg/dL for primary prevention;   <70 mg/dL for patients with CHD or diabetic patients  with > or = 2 CHD risk factors. . Marland KitchenDL-C is now calculated using the Martin-Hopkins  calculation, which is a validated novel method providing  better accuracy than the Friedewald equation in the  estimation of LDL-C.  MaCresenciano Genret al. JAAnnamaria [id number];[id number] [id number]  (http://education.[facility].com/faq/FAQ164)   . Total CHOL/HDL Ratio 08/18/2020 2.5  <5.0 (calc) Final  . Non-HDL Cholesterol (Calc) 08/18/2020 77  <130 mg/dL (calc) Final   Comment: For patients with diabetes plus 1 major ASCVD risk  factor, treating to a non-HDL-C goal of <100 mg/dL  (LDL-C of <70 mg/dL) is considered a therapeutic  option.   . Marland KitchenSA 08/18/2020 0.83  < OR = 4.0 ng/mL Final   Comment: The total PSA value from this assay system is  standardized against the WHO standard. The test  result will be approximately 20% lower when compared  to the equimolar-standardized total PSA (Beckman  Coulter). Comparison of serial PSA results should be  interpreted with this fact in mind. . This test was performed using the Siemens  chemiluminescent method. Values obtained from  different assay methods cannot be used interchangeably. PSA levels, regardless of value, should not be interpreted as absolute evidence of the presence or absence of disease.   Clinical Support on 07/29/2020  Component Date Value Ref Range Status  . Date Time Interrogation Session 07/29/2020 [id number] Final  . Pulse Generator Manufacturer 07/29/2020 S[facility]    Final  . Pulse Gen Model 07/29/2020 2272 Assurity MRI   Final  . Pulse Gen Serial Number 07/29/2020 [id number] Final  . [facility] Name 07/29/2020 [facility]   Final  . Implantable Pulse Generator Type 07/29/2020 Implantable Pulse Generator   Final  . Implantable Pulse Generator Implan* 07/29/2020 [id number] Final  . Implantable Lead Manufacturer 07/29/2020 [facility]   Final  . Implantable Lead Model 07/29/2020 [id number] Tendril MRI   Final  . Implantable Lead Serial Number 07/29/2020 [id number] Final  . Implantable Lead Implant  Date 07/29/2020 [id number]   Final  . Implantable Lead Location Detail 1 07/29/2020 UNKNOWN   Final  . Implantable Lead Location 07/29/2020 [id number]   Final  . Implantable Lead Manufacturer 07/29/2020 [facility]   Final  . Implantable Lead Model 07/29/2020 [id number] Tendril MRI   Final  . Implantable Lead Serial Number 07/29/2020 [id number]   Final  . Implantable Lead Implant Date 07/29/2020 [id number]   Final  . Implantable Lead Location Detail 1 07/29/2020 UNKNOWN   Final  . Implantable Lead Location 07/29/2020 [id number]   Final  . Lead Channel Setting Sensing Sensi* 07/29/2020 2.0  mV Final  . Lead Channel Setting Sensing Adapt* 07/29/2020 Fixed Pacing   Final  . Lead Channel Setting Pacing Amplit* 07/29/2020 2.0  V Final  . Lead Channel Setting Pacing Pulse * 07/29/2020 0.5  ms Final  . Lead Channel Setting Pacing Amplit* 07/29/2020 2.5  V Final  . Lead Channel Status 07/29/2020 NULL   Final  . Lead Channel Impedance Value 07/29/2020 480  ohm Final  . Lead Channel Sensing Intrinsic Amp* 07/29/2020 2.1  mV Final  . Lead Channel Pacing Threshold Ampl* 07/29/2020 0.75  V Final  . Lead Channel Pacing Threshold Puls* 07/29/2020 0.5  ms Final  . Lead Channel Status 07/29/2020 NULL   Final  . Lead Channel Impedance Value 07/29/2020 510  ohm Final  . Lead Channel Sensing Intrinsic Amp* 07/29/2020 4.2  mV Final  . Lead Channel Pacing Threshold Ampl* 07/29/2020 0.75  V Final  . Lead  Channel Pacing Threshold Puls* 07/29/2020 0.5  ms Final  . Battery Status 07/29/2020 MOS   Final  . Battery Remaining Longevity 07/29/2020 110  mo Final  . Battery Remaining Percentage 07/29/2020 95.5  % Final  . Battery Voltage 07/29/2020 3.01  V Final  . Brady Statistic RA Percent Paced 07/29/2020 1.0  % Final  . Brady Statistic RV Percent Paced 07/29/2020 81.0  % Final  . Brady Statistic AP VP Percent 07/29/2020 1.0  % Final  . Brady Statistic AS VP Percent 07/29/2020 80.0  % Final  . Brady Statistic AP VS Percent 07/29/2020 1.0  % Final  . Loletha Grayer Statistic AS VS Percent 07/29/2020 19.0  % Final    Immunization History  Administered Date(s) Administered  . Influenza Split 03/04/2013, 02/28/2014, 03/01/2015, 02/26/2016  . Influenza, Seasonal, Injecte, Preservative Fre 03/04/2013, 02/28/2014  . Influenza-Unspecified 03/01/2015, 02/26/2016  . Moderna Sars-Covid-2 Vaccination 07/14/2019, 08/10/2019  . Pneumococcal Conjugate-13 05/29/2015  . Pneumococcal Polysaccharide-23 06/14/2004, 09/17/2016  . Pneumococcal-Unspecified 06/14/2004, 09/17/2016  . Tdap 03/30/2012, 12/07/2018  . Zoster 03/30/2012    Past Medical History:  Diagnosis Date  . Allergic rhinitis    chronic sinusitis  . Arthritis   . CAD (coronary artery disease)    a. 2009 PCI/DES to mLAD; b. 05/2010 s/p DES RCA and LAD.; c. 05/2013 Cath: LAD mild ISR, LCX nl, RCA 40p, patent stents.  . Cataract    has had lasik surg  . COPD (chronic obstructive pulmonary disease) ([place])   . Diabetes mellitus type 2, controlled, without complications ([place])   . Diabetes mellitus without complication ([place])    Phreesia 08/22/2020  . Diverticulosis   . DJD (degenerative joint disease)   . Esophagitis 1991   grade 1  . GERD (gastroesophageal reflux disease)    Hiatal hernia  . Hemorrhoids   . Hyperlipidemia   . Hypertensive heart disease   . Iron deficiency anemia   . Migraines   . Paroxysmal atrial fibrillation (  [place])    a. s/p afib  ablation 10/22/08 ([place] [place]).  . PVC's (premature ventricular contractions)   . Sleep apnea    [place]ional[place]: RDI during the total sleep time 6h 28 mins was 3.55/hr during REM sleep was at 5.71/hr. Supine AHI was 5.93/hr.  . Syncope 08/2018   Past Surgical History:  Procedure Laterality Date  . CARDIAC CATHETERIZATION  05/2010   The proximal LAD was then predilated with 2.0 x 12 trek. this was then stented with a 2.5 x 16 promus Element drug-eluting stent at 14 atmosphere and prostdilated with 2.75 x 12 [place] trek at 16 atmospheres(2.8 mm) resulting the reduction of 80% proximal LAD stenosis to 0% residual with excellent flow.  Marland Kitchen CARDIAC CATHETERIZATION  06/05/2010   Successful percutaneous coronary intervention to the right coronary artery with percutaneous transluminal coronary angioplasty/stenting and insertion 3.0 x 16 mm Promus DES post dilated to 3.35 mm with stenosis being reduced to 0%  . CARDIAC CATHETERIZATION  02/2008   Post dilatation was performed using a 2.75 x 9 [place] sprinter, 10 atmospheres for 40 seconds and then 9 atmosphere for 35 sec. This resulted in the 80% area of narrowing pre-intervention, now appearing to normal. There was no edvidence of the dissection or thrombus and there was TIMI III flow pre and post.  . CARDIAC CATHETERIZATION  02/2006  . CORONARY ANGIOPLASTY WITH STENT PLACEMENT     3 stents/ 4 caths  . EYE SURGERY    . KNEE ARTHROSCOPY     right  . LASIK    . LEFT HEART CATHETERIZATION WITH CORONARY ANGIOGRAM N/A 06/29/2013   Procedure: LEFT HEART CATHETERIZATION WITH CORONARY ANGIOGRAM;  Surgeon: Leonie Man, MD;  Location: [facility] CATH LAB;  Service: Cardiovascular;  Laterality: N/A;  . NASAL SINUS SURGERY  2001  . NECK SURGERY     Cervical fusion  . PACEMAKER IMPLANT N/A 05/01/2019   Procedure: PACEMAKER IMPLANT;  Surgeon: [name], MD;  Location: [facility] CV LAB;  Service: Cardiovascular;  Laterality: N/A;  . RADIOFREQUENCY ABLATION  May 2010    atrial fibrillation  . ROTATOR CUFF REPAIR     left  . SHOULDER OPEN ROTATOR CUFF REPAIR     right  . SPINE SURGERY    . WRIST ARTHROCENTESIS     left   Current Outpatient Medications on File Prior to Visit  Medication Sig Dispense Refill  . Accu-Chek FastClix Lancets MISC USE TO CHECK BLOOD SUGAR TWICE DAILY 102 each 3  . ACCU-CHEK GUIDE test strip USE TO CHECK BLOOD SUGAR TWICE DAILY 100 strip 1  . acetaminophen (TYLENOL) 325 MG tablet Take 1-2 tablets (325-650 mg total) by mouth every 4 (four) hours as needed for mild pain.    Marland Kitchen amLODipine (NORVASC) 10 MG tablet TAKE 1 TABLET(10 MG) BY MOUTH DAILY 90 tablet 3  . aspirin EC 81 MG tablet Take 81 mg by mouth at bedtime.    . Blood Glucose Monitoring Suppl (ACCU-CHEK AVIVA PLUS) w/Device KIT Check BS bid E11.9 1 kit 1  . cholecalciferol (VITAMIN D) 1000 UNITS tablet Take 1,000 Units by mouth at bedtime.     . citalopram (CELEXA) 40 MG tablet TAKE 1 TABLET(40 MG) BY MOUTH DAILY 90 tablet 1  . Coenzyme Q10 (CO Q 10) 100 MG CAPS Take 100 mg by mouth daily with breakfast.     . Erenumab-aooe (AIMOVIG) 140 MG/ML SOAJ Inject 140 mg into the skin every 30 (thirty) days. On or about the 1st of each  month    . fexofenadine (ALLEGRA) 180 MG tablet Take 180 mg by mouth daily with breakfast.     . fluticasone (FLONASE) 50 MCG/ACT nasal spray INSTILL 2 SPRAYS IN THE  NOSE AT BEDTIME 48 g 3  . furosemide (LASIX) 40 MG tablet TAKE 1 TABLET BY MOUTH  DAILY 90 tablet 3  . JARDIANCE 25 MG TABS tablet TAKE 1 TABLET BY MOUTH DAILY BEFORE BREAKFAST 30 tablet 5  . Lancets Misc. (ACCU-CHEK FASTCLIX LANCET) KIT Check BS bid E11.9 1 kit 1  . methocarbamol (ROBAXIN) 750 MG tablet Take 750 mg by mouth at bedtime. migraine    . Multiple Vitamin (MULTIVITAMIN WITH MINERALS) TABS tablet Take 1 tablet by mouth daily with breakfast.    . nitroGLYCERIN (NITROSTAT) 0.4 MG SL tablet PLACE ONE TABLET UNDER THE TONGUE EVERY 5 MINUTES AS NEEDED FOR CHEST PAIN 25 tablet 3  .  oxyCODONE-acetaminophen (PERCOCET) 10-325 MG tablet Take 1 tablet by mouth every 8 (eight) hours as needed for pain. 21 tablet 0  . pantoprazole (PROTONIX) 40 MG tablet TAKE 1 TABLET BY MOUTH  DAILY 90 tablet 3  . Polyvinyl Alcohol-Povidone (REFRESH OP) Place 1 drop into both eyes at bedtime as needed (dry eyes).    . potassium chloride (MICRO-K) 10 MEQ CR capsule TAKE 1 CAPSULE BY MOUTH  DAILY 90 capsule 3  . rosuvastatin (CRESTOR) 40 MG tablet TAKE 1 TABLET BY MOUTH  DAILY 90 tablet 3  . telmisartan (MICARDIS) 40 MG tablet TAKE 1 TABLET BY MOUTH  DAILY 90 tablet 3   No current facility-administered medications on file prior to visit.   Allergies  Allergen Reactions  . Ibuprofen Swelling    Whole body swelled  . Other Shortness Of Breath    BETA BLOCKERS  . Topamax [Topiramate] Other (See Comments)    Pt states it made his tongue feel "scalded" and he couldn't eat   . Toprol Xl [Metoprolol Succinate] Other (See Comments)    Personality change  . Metoprolol-Hctz Er Other (See Comments)    Indigestion,mouth raw   Social History   Socioeconomic History  . Marital status: Married    Spouse name: Not on file  . Number of children: 2  . Years of education: Not on file  . Highest education level: Not on file  Occupational History  . Occupation: retired  Tobacco Use  . Smoking status: Former Smoker    Quit date: 06/01/1987    Years since quitting: 33.2  . Smokeless tobacco: Never Used  Vaping Use  . Vaping Use: Never used  Substance and Sexual Activity  . Alcohol use: Yes    Alcohol/week: 0.0 standard drinks    Comment: once every 6-7 months  . Drug use: No  . Sexual activity: Not on file  Other Topics Concern  . Not on file  Social History Narrative  . Not on file   Social Determinants of Health   Financial Resource Strain: Not on file  Food Insecurity: Not on file  Transportation Needs: Not on file  Physical Activity: Not on file  Stress: Not on file  Social  Connections: Not on file  Intimate Partner Violence: Not on file   Family History  Problem Relation Age of Onset  . Heart disease Father        and sister  . Diabetes Father   . Colon cancer Mother        mets from uterine  . Uterine cancer Mother   . Heart disease  Sister      Review of Systems  All other systems reviewed and are negative.      Objective:   Physical Exam Vitals reviewed.  Constitutional:      General: He is not in acute distress.    Appearance: He is well-developed. He is not diaphoretic.  HENT:     Head: Normocephalic and atraumatic.     Right Ear: External ear normal.     Left Ear: External ear normal.     Nose: Nose normal.     Mouth/Throat:     Pharynx: No oropharyngeal exudate.  Eyes:     General: No scleral icterus.       Right eye: No discharge.        Left eye: No discharge.     Conjunctiva/sclera: Conjunctivae normal.     Pupils: Pupils are equal, round, and reactive to light.  Neck:     Thyroid: No thyromegaly.     Vascular: No JVD.     Trachea: No tracheal deviation.  Cardiovascular:     Rate and Rhythm: Normal rate and regular rhythm.     Heart sounds: Murmur heard.  No friction rub. No gallop.   Pulmonary:     Effort: Pulmonary effort is normal. No respiratory distress.     Breath sounds: Normal breath sounds. No stridor. No wheezing or rales.  Chest:     Chest wall: No tenderness.  Abdominal:     General: Bowel sounds are normal. There is no distension.     Palpations: Abdomen is soft. There is no mass.     Tenderness: There is no abdominal tenderness. There is no guarding or rebound.     Hernia: No hernia is present.  Musculoskeletal:        General: No tenderness. Normal range of motion.     Cervical back: Normal range of motion and neck supple.  Lymphadenopathy:     Cervical: No cervical adenopathy.  Skin:    General: Skin is warm.     Capillary Refill: Capillary refill takes less than 2 seconds.     Coloration: Skin  is not pale.     Findings: No erythema or rash.  Neurological:     Mental Status: He is alert and oriented to person, place, and time.     Cranial Nerves: No cranial nerve deficit.     Sensory: No sensory deficit.     Motor: No abnormal muscle tone.     Coordination: Coordination normal.     Deep Tendon Reflexes: Reflexes normal.  Psychiatric:        Behavior: Behavior normal.        Thought Content: Thought content normal.        Judgment: Judgment normal.           Assessment & Plan:  General medical exam  Type 2 diabetes mellitus with complication, without long-term current use of insulin (HCC)  CAD S/P percutaneous coronary angioplasty  Essential hypertension  Hyperlipidemia LDL goal <70  Paroxysmal atrial fibrillation (HCC)  Physical exam today is significant only for an elevated BMI.  I encouraged 30 minutes a day 5 days a week of aerobic exercise and I would like to see the patient try to lose 10 to 20 pounds.  His A1c is acceptable.  However he is mainly staying on Jardiance due to the cardiovascular benefits.  Cholesterol is outstanding.  Liver and kidney function tests are normal.  CBC is stable.  PSA is within  normal limits.  Colonoscopy is due again in 2024.  Immunizations are up-to-date except for the Shingrix vaccine which we discussed today.  He denies any falls, depression, or memory loss.

## 2020-09-01 ENCOUNTER — Other Ambulatory Visit: Payer: Self-pay

## 2020-09-01 ENCOUNTER — Encounter: Payer: Self-pay | Admitting: Internal Medicine

## 2020-09-01 ENCOUNTER — Ambulatory Visit (INDEPENDENT_AMBULATORY_CARE_PROVIDER_SITE_OTHER): Payer: Medicare Other | Admitting: Internal Medicine

## 2020-09-01 VITALS — BP 100/64 | HR 78 | Ht 68.0 in | Wt 250.6 lb

## 2020-09-01 DIAGNOSIS — I2510 Atherosclerotic heart disease of native coronary artery without angina pectoris: Secondary | ICD-10-CM

## 2020-09-01 DIAGNOSIS — I442 Atrioventricular block, complete: Secondary | ICD-10-CM | POA: Diagnosis not present

## 2020-09-01 DIAGNOSIS — G4733 Obstructive sleep apnea (adult) (pediatric): Secondary | ICD-10-CM | POA: Diagnosis not present

## 2020-09-01 DIAGNOSIS — I10 Essential (primary) hypertension: Secondary | ICD-10-CM | POA: Diagnosis not present

## 2020-09-01 DIAGNOSIS — I480 Paroxysmal atrial fibrillation: Secondary | ICD-10-CM

## 2020-09-01 NOTE — Progress Notes (Signed)
  PCP: Pickard, Warren T, MD Primary Cardiologist: Dr Kelly Primary EP:  Dr Allred  Lee [name] is a 69 y.o. male who presents today for routine electrophysiology followup.  Since last being seen in our clinic, the patient reports doing very well.  Today, he denies symptoms of palpitations, chest pain, shortness of breath,  lower extremity edema, dizziness, presyncope, or syncope.  The patient is otherwise without complaint today.   Past Medical History:  Diagnosis Date  . Allergic rhinitis    chronic sinusitis  . Arthritis   . CAD (coronary artery disease)    a. 2009 PCI/DES to mLAD; b. 05/2010 s/p DES RCA and LAD.; c. 05/2013 Cath: LAD mild ISR, LCX nl, RCA 40p, patent stents.  . Cataract    has had lasik surg  . COPD (chronic obstructive pulmonary disease) (HCC)   . Diabetes mellitus type 2, controlled, without complications (HCC)   . Diabetes mellitus without complication (HCC)    Phreesia 08/22/2020  . Diverticulosis   . DJD (degenerative joint disease)   . Esophagitis 1991   grade 1  . GERD (gastroesophageal reflux disease)    Hiatal hernia  . Hemorrhoids   . Hyperlipidemia   . Hypertensive heart disease   . Iron deficiency anemia   . Migraines   . Paroxysmal atrial fibrillation (HCC)    a. s/p afib ablation 10/22/08 (J. Allred).  . PVC's (premature ventricular contractions)   . Sleep apnea    Questionalble: RDI during the total sleep time 6h 28 mins was 3.55/hr during REM sleep was at 5.71/hr. Supine AHI was 5.93/hr.  . Syncope 08/2018   Past Surgical History:  Procedure Laterality Date  . CARDIAC CATHETERIZATION  05/2010   The proximal LAD was then predilated with 2.0 x 12 trek. this was then stented with a 2.5 x 16 promus Element drug-eluting stent at 14 atmosphere and prostdilated with 2.75 x 12 [place] trek at 16 atmospheres(2.8 mm) resulting the reduction of 80% proximal LAD stenosis to 0% residual with excellent flow.  . CARDIAC CATHETERIZATION  06/05/2010    Successful percutaneous coronary intervention to the right coronary artery with percutaneous transluminal coronary angioplasty/stenting and insertion 3.0 x 16 mm Promus DES post dilated to 3.35 mm with stenosis being reduced to 0%  . CARDIAC CATHETERIZATION  02/2008   Post dilatation was performed using a 2.75 x 9 [place] sprinter, 10 atmospheres for 40 seconds and then 9 atmosphere for 35 sec. This resulted in the 80% area of narrowing pre-intervention, now appearing to normal. There was no edvidence of the dissection or thrombus and there was TIMI III flow pre and post.  . CARDIAC CATHETERIZATION  02/2006  . CORONARY ANGIOPLASTY WITH STENT PLACEMENT     3 stents/ 4 caths  . EYE SURGERY    . KNEE ARTHROSCOPY     right  . LASIK    . LEFT HEART CATHETERIZATION WITH CORONARY ANGIOGRAM N/A 06/29/2013   Procedure: LEFT HEART CATHETERIZATION WITH CORONARY ANGIOGRAM;  Surgeon: David W Harding, MD;  Location: [facility] CATH LAB;  Service: Cardiovascular;  Laterality: N/A;  . NASAL SINUS SURGERY  2001  . NECK SURGERY     Cervical fusion  . PACEMAKER IMPLANT N/A 05/01/2019   Procedure: PACEMAKER IMPLANT;  Surgeon: Allred, Isaak, MD;  Location: [facility] INVASIVE CV LAB;  Service: Cardiovascular;  Laterality: N/A;  . RADIOFREQUENCY ABLATION  May 2010   atrial fibrillation  . ROTATOR CUFF REPAIR     left  . SHOULDER   OPEN ROTATOR CUFF REPAIR     right  . SPINE SURGERY    . WRIST ARTHROCENTESIS     left    ROS- all systems are reviewed and negative except as per HPI above  Current Outpatient Medications  Medication Sig Dispense Refill  . Accu-Chek FastClix Lancets MISC USE TO CHECK BLOOD SUGAR TWICE DAILY 102 each 3  . ACCU-CHEK GUIDE test strip USE TO CHECK BLOOD SUGAR TWICE DAILY 100 strip 1  . acetaminophen (TYLENOL) 325 MG tablet Take 1-2 tablets (325-650 mg total) by mouth every 4 (four) hours as needed for mild pain.    . amLODipine (NORVASC) 10 MG tablet TAKE 1 TABLET(10 MG) BY MOUTH DAILY 90 tablet 3  .  aspirin EC 81 MG tablet Take 81 mg by mouth at bedtime.    . Blood Glucose Monitoring Suppl (ACCU-CHEK AVIVA PLUS) w/Device KIT Check BS bid E11.9 1 kit 1  . cholecalciferol (VITAMIN D) 1000 UNITS tablet Take 1,000 Units by mouth at bedtime.     . citalopram (CELEXA) 40 MG tablet TAKE 1 TABLET(40 MG) BY MOUTH DAILY 90 tablet 1  . Coenzyme Q10 (CO Q 10) 100 MG CAPS Take 100 mg by mouth daily with breakfast.     . Erenumab-aooe (AIMOVIG) 140 MG/ML SOAJ Inject 140 mg into the skin every 30 (thirty) days. On or about the 1st of each month    . fexofenadine (ALLEGRA) 180 MG tablet Take 180 mg by mouth daily with breakfast.     . fluticasone (FLONASE) 50 MCG/ACT nasal spray INSTILL 2 SPRAYS IN THE  NOSE AT BEDTIME 48 g 3  . furosemide (LASIX) 40 MG tablet TAKE 1 TABLET BY MOUTH  DAILY 90 tablet 3  . JARDIANCE 25 MG TABS tablet TAKE 1 TABLET BY MOUTH DAILY BEFORE BREAKFAST 30 tablet 5  . Lancets Misc. (ACCU-CHEK FASTCLIX LANCET) KIT Check BS bid E11.9 1 kit 1  . methocarbamol (ROBAXIN) 750 MG tablet Take 750 mg by mouth at bedtime. migraine    . Multiple Vitamin (MULTIVITAMIN WITH MINERALS) TABS tablet Take 1 tablet by mouth daily with breakfast.    . nitroGLYCERIN (NITROSTAT) 0.4 MG SL tablet PLACE ONE TABLET UNDER THE TONGUE EVERY 5 MINUTES AS NEEDED FOR CHEST PAIN 25 tablet 3  . oxyCODONE-acetaminophen (PERCOCET) 10-325 MG tablet Take 1 tablet by mouth every 8 (eight) hours as needed for pain. 21 tablet 0  . pantoprazole (PROTONIX) 40 MG tablet TAKE 1 TABLET BY MOUTH  DAILY 90 tablet 3  . Polyvinyl Alcohol-Povidone (REFRESH OP) Place 1 drop into both eyes at bedtime as needed (dry eyes).    . potassium chloride (MICRO-K) 10 MEQ CR capsule TAKE 1 CAPSULE BY MOUTH  DAILY 90 capsule 3  . rosuvastatin (CRESTOR) 40 MG tablet TAKE 1 TABLET BY MOUTH  DAILY 90 tablet 3  . telmisartan (MICARDIS) 40 MG tablet TAKE 1 TABLET BY MOUTH  DAILY 90 tablet 3   No current facility-administered medications for this  visit.    Physical Exam: Vitals:   09/01/20 1422  BP: 100/64  Pulse: 78  SpO2: 93%  Weight: 250 lb 9.6 oz (113.7 kg)  Height: 5' 8" (1.727 m)    GEN- The patient is well appearing, alert and oriented x 3 today.   Head- normocephalic, atraumatic Eyes-  Sclera clear, conjunctiva pink Ears- hearing intact Oropharynx- clear Lungs- Clear to ausculation bilaterally, normal work of breathing Chest- pacemaker pocket is well healed Heart- Regular rate and rhythm, no murmurs, rubs   or gallops, PMI not laterally displaced GI- soft, NT, ND, + BS Extremities- no clubbing, cyanosis, or edema  Pacemaker interrogation- reviewed in detail today,  See PACEART report  ekg tracing ordered today is personally reviewed and shows sinus with V pacing  Assessment and Plan:  1. Symptomatic complete heart block Normal pacemaker function See Pace Art report No changes today he is device dependant today  2. HTN Stable No change required today  3. CAD No ischemic symptoms myoview 03/07/20 reviewed Follows with [name]  4. afib chads2vasc score is 3.  No afib since ablation in 2010.  Consider [place] if afib burden returns  Risks, benefits and potential toxicities for medications prescribed and/or refilled reviewed with patient today.   [name] MD, [facility] 09/01/2020 2:29 PM

## 2020-09-01 NOTE — Patient Instructions (Signed)
Medication Instructions:  Your physician recommends that you continue on your current medications as directed. Please refer to the Current Medication list given to you today.  Labwork: None ordered.  Testing/Procedures: None ordered.  Follow-Up: Your physician wants you to follow-up in: one year with [name], MD or one of the following Advanced Practice Providers on your designated Care Team:      Tommye Standard, PA-C     You will receive a reminder letter in the mail two months in advance. If you don't receive a letter, please call our office to schedule the follow-up appointment.    Any Other Special Instructions Will Be Listed Below (If Applicable).  If you need a refill on your cardiac medications before your next appointment, please call your pharmacy.

## 2020-09-11 ENCOUNTER — Other Ambulatory Visit: Payer: Self-pay | Admitting: Family Medicine

## 2020-10-21 ENCOUNTER — Encounter: Payer: Self-pay | Admitting: Cardiovascular Disease

## 2020-10-21 ENCOUNTER — Other Ambulatory Visit: Payer: Self-pay

## 2020-10-21 ENCOUNTER — Ambulatory Visit (INDEPENDENT_AMBULATORY_CARE_PROVIDER_SITE_OTHER): Payer: Medicare Other | Admitting: Cardiovascular Disease

## 2020-10-21 ENCOUNTER — Other Ambulatory Visit: Payer: Self-pay | Admitting: Family Medicine

## 2020-10-21 DIAGNOSIS — I480 Paroxysmal atrial fibrillation: Secondary | ICD-10-CM | POA: Diagnosis not present

## 2020-10-21 DIAGNOSIS — Z9861 Coronary angioplasty status: Secondary | ICD-10-CM

## 2020-10-21 DIAGNOSIS — E118 Type 2 diabetes mellitus with unspecified complications: Secondary | ICD-10-CM

## 2020-10-21 DIAGNOSIS — I10 Essential (primary) hypertension: Secondary | ICD-10-CM

## 2020-10-21 DIAGNOSIS — G4733 Obstructive sleep apnea (adult) (pediatric): Secondary | ICD-10-CM

## 2020-10-21 DIAGNOSIS — I2510 Atherosclerotic heart disease of native coronary artery without angina pectoris: Secondary | ICD-10-CM | POA: Diagnosis not present

## 2020-10-21 DIAGNOSIS — Z6838 Body mass index (BMI) 38.0-38.9, adult: Secondary | ICD-10-CM

## 2020-10-21 DIAGNOSIS — Z950 Presence of cardiac pacemaker: Secondary | ICD-10-CM

## 2020-10-21 NOTE — Progress Notes (Signed)
Patient ID: KYIAN OBST, male   DOB: June 04, 1950, 70 y.o.   MRN: [id number]     Primary M.D.: Dr. Jenna Luo  HPI: DAIEL STROHECKER, is a 70 y.o. male who presents to the office today for a 14 month followup cardiology evaluation.  Mr. Purdum has known CAD and in 2009 underwent stenting of his mid LAD. In 2012 he underwent staged intervention with stenting of his proximal RCA and at a new site in his proximal LAD. A nuclear perfusion study February 2014 showed normal perfusion and function. In January 2015, he was seen by [name] in the office with complaints of chest pressure. He underwent cardiac catheterization this chest pain which was done by Dr. [name] on 06/29/2013. Catheterization did not demonstrate significant restenosis of his LAD stents with mild 20% narrowing in the proximal stent and 40% mid stent. The circumflex was normal. His RCA had 40% narrowing just proximal to a widely patent stent in the mid vessel. Nitrate therapy was added to his medical regimen but due to significant nitrate mediated headaches this ultimately was discontinued.   In 2007, a sleep study  suggested upper airway resistance syndrome without overall sleep apnea with the exception of mild sleep apnea with REM sleep; at that time CPAP was not recommended. In June 2014 he was having symptoms suggestive of progressive sleep apnea. He ultimately underwent a sleep study. I do not have the specifics of this diagnostic polysomnogram but subsequently he did undergo a CPAP titration to apparently his AHI was 12 on his initial study and he dropped his oxygen from 96% to 83%. He ultimately was titrated up to a CPAP pressure of 11 cm.  Additional problems also include obesity , as well as a history of palpitations.  I had titrated his beta blocker therapy which improved his palpitations.  He remains active.  He denies any episodes of chest pain.  He denies presyncope or syncope.  A follow-up myocardial  perfusion study on 09/23/2015 showed an ejection fraction at 62%.  There were no ECG changes.  He had normal perfusion without scar or ischemia.  I  saw him in June 2018 at which time was experiencing occasional palpitations.  We discussed reduction of caffeine and I added low-dose Cardizem CD to his regimen.    When I  saw him in July 2019 he was doing well and was without recurrent chest pain.  He was continuing to use CPAP with 1 or percent compliance; [facility] his DME.   He was hospitalized in April 2020 with syncope  suspected to be secondary to a vagal episode.  He was noted to have significant sinus bradycardia and diltiazem was stopped.  An echo Doppler study on September 20, 2018 showed an EF of 60 to 65% with grade 2 diastolic dysfunction, clinical vacation and aortic valve sclerosis.  He was discharged home with plans for monitoring on Coreg.  He was admitted on Oct 10, 2018 with recurrent syncope and was found to have significant pauses on his monitor and had a 36 seconds strip showing multiple pauses lasting 6, 7, 9, and 12 seconds.  He was initially found by EMS in complete heart block with a ventricular rate in the 20s which improved to 2-1 and eventually sinus rhythm.  He was admitted to the hospital, carvedilol was discontinued.  He was  evaluated by Dr. Rayann Heman in the office in June 2020 and was doing well without further symptoms of bradycardia, palpitations  chest pain shortness of breath dizziness presyncope or syncope.  On May 01, 2019 he underwent insertion of a Saint Jude dual-chamber permanent pacemaker by Dr. Rayann Heman for symptomatic Mobitz 2 second-degree AV block.   I last saw him in March 2021.  Since I last saw him, he received a new ResMed air sense 10 CPAP unit.  [facility] is his DME provider and had not yet been linked to our office.  He has had issues with sleep initiation but notices significant improvement with his CPAP device.  With difficulty with sleep  initiation he was prescribed zolpidem 5 mg to take as needed.  Since I last saw him, he denies any chest pain or shortness of breath.  He has been using CPAP therapy and typically goes to bed at 11 PM but is up between 4 and 5 AM.  I obtained a new download today from September 22, 2020 through Oct 21, 2020.  Usage days excellent at 97%.  He was averaging 5 hours and 30 minutes of CPAP use per night.  I had a 14 cm set pressure, AHI is 1 clinically he feels well.  He is unaware of any breakthrough snoring.  He had undergone laboratory by Dr. Dennard Schaumann and hemoglobin A1c was 6.6.  He presents for evaluation.  Past Medical History:  Diagnosis Date  . Allergic rhinitis    chronic sinusitis  . Arthritis   . CAD (coronary artery disease)    a. 2009 PCI/DES to mLAD; b. 05/2010 s/p DES RCA and LAD.; c. 05/2013 Cath: LAD mild ISR, LCX nl, RCA 40p, patent stents.  . Cataract    has had lasik surg  . COPD (chronic obstructive pulmonary disease) ([place])   . Diabetes mellitus type 2, controlled, without complications ([place])   . Diabetes mellitus without complication ([place])    Phreesia 08/22/2020  . Diverticulosis   . DJD (degenerative joint disease)   . Esophagitis 1991   grade 1  . GERD (gastroesophageal reflux disease)    Hiatal hernia  . Hemorrhoids   . Hyperlipidemia   . Hypertensive heart disease   . Iron deficiency anemia   . Migraines   . Paroxysmal atrial fibrillation (HCC)    a. s/p afib ablation 10/22/08 ([facility]).  . PVC's (premature ventricular contractions)   . Sleep apnea    Questionalble: RDI during the total sleep time 6h 28 mins was 3.55/hr during REM sleep was at 5.71/hr. Supine AHI was 5.93/hr.  . Syncope 08/2018    Past Surgical History:  Procedure Laterality Date  . CARDIAC CATHETERIZATION  05/2010   The proximal LAD was then predilated with 2.0 x 12 trek. this was then stented with a 2.5 x 16 promus Element drug-eluting stent at 14 atmosphere and prostdilated with 2.75 x 12 [place]  trek at 16 atmospheres(2.8 mm) resulting the reduction of 80% proximal LAD stenosis to 0% residual with excellent flow.  Marland Kitchen CARDIAC CATHETERIZATION  06/05/2010   Successful percutaneous coronary intervention to the right coronary artery with percutaneous transluminal coronary angioplasty/stenting and insertion 3.0 x 16 mm Promus DES post dilated to 3.35 mm with stenosis being reduced to 0%  . CARDIAC CATHETERIZATION  02/2008   Post dilatation was performed using a 2.75 x 9 [place] sprinter, 10 atmospheres for 40 seconds and then 9 atmosphere for 35 sec. This resulted in the 80% area of narrowing pre-intervention, now appearing to normal. There was no edvidence of the dissection or thrombus and there was TIMI III flow pre  and post.  . CARDIAC CATHETERIZATION  02/2006  . CORONARY ANGIOPLASTY WITH STENT PLACEMENT     3 stents/ 4 caths  . EYE SURGERY    . KNEE ARTHROSCOPY     right  . LASIK    . LEFT HEART CATHETERIZATION WITH CORONARY ANGIOGRAM N/A 06/29/2013   Procedure: LEFT HEART CATHETERIZATION WITH CORONARY ANGIOGRAM;  Surgeon: Leonie Man, MD;  Location: [facility] CATH LAB;  Service: Cardiovascular;  Laterality: N/A;  . NASAL SINUS SURGERY  2001  . NECK SURGERY     Cervical fusion  . PACEMAKER IMPLANT N/A 05/01/2019   Procedure: PACEMAKER IMPLANT;  Surgeon: [name], MD;  Location: [facility] CV LAB;  Service: Cardiovascular;  Laterality: N/A;  . RADIOFREQUENCY ABLATION  May 2010   atrial fibrillation  . ROTATOR CUFF REPAIR     left  . SHOULDER OPEN ROTATOR CUFF REPAIR     right  . SPINE SURGERY    . WRIST ARTHROCENTESIS     left    Allergies  Allergen Reactions  . Ibuprofen Swelling    Whole body swelled  . Other Shortness Of Breath    BETA BLOCKERS  . Topamax [Topiramate] Other (See Comments)    Pt states it made his tongue feel "scalded" and he couldn't eat   . Toprol Xl [Metoprolol Succinate] Other (See Comments)    Personality change  . Metoprolol-Hctz Er Other (See  Comments)    Indigestion,mouth raw    Current Outpatient Medications  Medication Sig Dispense Refill  . Accu-Chek FastClix Lancets MISC USE TO CHECK BLOOD SUGAR TWICE DAILY 102 each 3  . ACCU-CHEK GUIDE test strip USE TO CHECK BLOOD SUGAR 2 TIMES DAILY 100 strip 1  . acetaminophen (TYLENOL) 325 MG tablet Take 1-2 tablets (325-650 mg total) by mouth every 4 (four) hours as needed for mild pain.    Marland Kitchen amLODipine (NORVASC) 10 MG tablet TAKE 1 TABLET(10 MG) BY MOUTH DAILY 90 tablet 3  . aspirin EC 81 MG tablet Take 81 mg by mouth at bedtime.    . Blood Glucose Monitoring Suppl (ACCU-CHEK AVIVA PLUS) w/Device KIT Check BS bid E11.9 1 kit 1  . cholecalciferol (VITAMIN D) 1000 UNITS tablet Take 1,000 Units by mouth at bedtime.     . citalopram (CELEXA) 40 MG tablet TAKE 1 TABLET(40 MG) BY MOUTH DAILY 90 tablet 1  . Coenzyme Q10 (CO Q 10) 100 MG CAPS Take 100 mg by mouth daily with breakfast.     . Erenumab-aooe (AIMOVIG) 140 MG/ML SOAJ Inject 140 mg into the skin every 30 (thirty) days. On or about the 1st of each month    . fexofenadine (ALLEGRA) 180 MG tablet Take 180 mg by mouth daily with breakfast.     . fluticasone (FLONASE) 50 MCG/ACT nasal spray INSTILL 2 SPRAYS IN THE  NOSE AT BEDTIME 48 g 3  . furosemide (LASIX) 40 MG tablet TAKE 1 TABLET BY MOUTH  DAILY 90 tablet 3  . JARDIANCE 25 MG TABS tablet TAKE 1 TABLET BY MOUTH DAILY BEFORE BREAKFAST 30 tablet 5  . Lancets Misc. (ACCU-CHEK FASTCLIX LANCET) KIT Check BS bid E11.9 1 kit 1  . Multiple Vitamin (MULTIVITAMIN WITH MINERALS) TABS tablet Take 1 tablet by mouth daily with breakfast.    . nitroGLYCERIN (NITROSTAT) 0.4 MG SL tablet PLACE ONE TABLET UNDER THE TONGUE EVERY 5 MINUTES AS NEEDED FOR CHEST PAIN 25 tablet 3  . oxyCODONE-acetaminophen (PERCOCET) 10-325 MG tablet Take 1 tablet by mouth every 8 (  eight) hours as needed for pain. 21 tablet 0  . pantoprazole (PROTONIX) 40 MG tablet TAKE 1 TABLET BY MOUTH  DAILY 90 tablet 3  . Polyvinyl  Alcohol-Povidone (REFRESH OP) Place 1 drop into both eyes at bedtime as needed (dry eyes).    . potassium chloride (MICRO-K) 10 MEQ CR capsule TAKE 1 CAPSULE BY MOUTH  DAILY 90 capsule 3  . rosuvastatin (CRESTOR) 40 MG tablet TAKE 1 TABLET BY MOUTH  DAILY 90 tablet 3  . telmisartan (MICARDIS) 40 MG tablet TAKE 1 TABLET BY MOUTH  DAILY 90 tablet 3   No current facility-administered medications for this visit.    Socially he's married has 2 children he works in Development worker, international aid. There is a remote tobacco history but he quit in 1989.  ROS General: Negative; No fevers, chills, or night sweats; positive for obesity HEENT: Negative; No changes in vision or hearing, sinus congestion, difficulty swallowing Pulmonary: Negative; No cough, wheezing, shortness of breath, hemoptysis Cardiovascular: See history of present illness;  GI: Negative; No nausea, vomiting, diarrhea, or abdominal pain GU: Positive for GERD; No dysuria, hematuria, or difficulty voiding Musculoskeletal: Negative; no myalgias, joint pain, or weakness Hematologic/Oncology: Negative; no easy bruising, bleeding Endocrine: Negative; no heat/cold intolerance; no diabetes Neuro: Positive for history of migraine headaches; no changes in balance,  Skin: Negative; No rashes or skin lesions Psychiatric: Negative; No behavioral problems, depression Sleep: Positive for obstructive sleep apnea,  on CPAP therapy; No snoring, daytime sleepiness, hypersomnolence, bruxism, restless legs, hypnogognic hallucinations, no cataplexy Other comprehensive 14 point system review is negative.  PE BP 130/80 (BP Location: Right Arm)   Pulse 73   Ht 5' 7.5" (1.715 m)   Wt 246 lb 9.6 oz (111.9 kg)   BMI 38.05 kg/m   Morbid obesity  Repeat blood pressure by me was 128/78  Wt Readings from Last 3 Encounters:  10/21/20 246 lb 9.6 oz (111.9 kg)  09/01/20 250 lb 9.6 oz (113.7 kg)  08/25/20 249 lb (112.9 kg)   General: Alert, oriented, no distress.   Skin: normal turgor, no rashes, warm and dry HEENT: Normocephalic, atraumatic. Pupils equal round and reactive to light; sclera anicteric; extraocular muscles intact; Fundi ** Nose without nasal septal hypertrophy Mouth/Parynx benign; Mallinpatti scale Neck: No JVD, no carotid bruits; normal carotid upstroke Lungs: clear to ausculatation and percussion; no wheezing or rales Chest wall: without tenderness to palpitation Heart: PMI not displaced, RRR, s1 s2 normal, 5-7/8 systolic murmur, no diastolic murmur, no rubs, gallops, thrills, or heaves Abdomen: soft, nontender; no hepatosplenomehaly, BS+; abdominal aorta nontender and not dilated by palpation. Back: no CVA tenderness Pulses 2+ Musculoskeletal: full range of motion, normal strength, no joint deformities Extremities: no clubbing cyanosis or edema, Homan's sign negative  Neurologic: grossly nonfocal; Cranial nerves grossly wnl Psychologic: Normal mood and affect   ECG (independently read by me):  Atrial-sensed and ventricular paced rhythm with prolonged AV conduction, PR interval 240 ms.  March 2021 ECG (independently read by me): Atrial-sensed ventricular paced rhythm with prolonged AV conduction with PR interval 246 ms.  September 2020 ECG (independently read by me): Sinus rhythm at 83 bpm with first-degree AV block with a PR of 220 ms.  Right bundle branch block with repolarization changes.  July 2019 ECG (independently read by me): Normal sinus rhythm at 66 bpm.  First-degree AV block.  Right bundle branch block with repolarization changes.  June 2018 ECG (independently read by me): Normal sinus rhythm at 65 bpm.  First degree  AV block.  Right bundle branch block with repolarization changes.  PR interval 218 ms.  July 2017 ECG (independently read by me): Sinus rhythm at 69 bpm with right bundle branch block and first-degree AV block.  Q wave is present in lead 3.  April 2016 ECG (independently read by me): Normal sinus rhythm  at 68 with right bundle branch block and repolarization changes.  First-degree AV block.  No ectopy  October 2015 ECG (independently read by me): Normal sinus rhythm with right bundle branch block.  Borderline first-degree AV block with PR interval 206 ms.  No ectopy  Prior February 2015 ECG (independently read by me): Normal sinus rhythm at 66 beats per minute, right bundle branch block with repolarization changes, first-degree AV block.  ECG from 12/12/2012: NSR at 69, RBBB  LABS:  BMP Latest Ref Rng & Units 08/18/2020 05/12/2020 11/06/2019  Glucose 65 - 99 mg/dL 122(H) 206(H) 136(H)  BUN 7 - 25 mg/dL _0 Creatinine 0.70 - 1.18 mg/dL 0.85 0.88 0.88  BUN/Creat Ratio 6 - 22 (calc) NOT APPLICABLE NOT APPLICABLE NOT APPLICABLE  Sodium 375 - 146 mmol/L 139 135 137  Potassium 3.5 - 5.3 mmol/L 4.5 4.6 4.5  Chloride 98 - 110 mmol/L 101 99 100  CO2 20 - 32 mmol/L _1 Calcium 8.6 - 10.3 mg/dL 9.6 9.6 9.2    Hepatic Function Latest Ref Rng & Units 08/18/2020 05/12/2020 11/06/2019  Total Protein 6.1 - 8.1 g/dL 6.7 6.6 6.5  Albumin 3.5 - 5.0 g/dL - - -  AST 10 - 35 U/L _2 ALT 9 - 46 U/L _3 Alk Phosphatase 38 - 126 U/L - - -  Total Bilirubin 0.2 - 1.2 mg/dL 0.5 0.4 0.6    CBC Latest Ref Rng & Units 08/18/2020 11/06/2019 07/31/2019  WBC 3.8 - 10.8 Thousand/uL 7.3 7.8 6.9  Hemoglobin 13.2 - 17.1 g/dL 16.0 14.8 15.0  Hematocrit 38.5 - 50.0 % 48.0 44.7 45.0  Platelets 140 - 400 Thousand/uL 219 211 211   Lab Results  Component Value Date   MCV 89.6 08/18/2020   MCV 88.0 11/06/2019   MCV 87.0 07/31/2019    BNP    Component Value Date/Time   PROBNP 36.0 06/04/2010 2206    Lipid Panel     Component Value Date/Time   CHOL 128 08/18/2020 0803   CHOL 155 11/21/2012 1025   TRIG 81 08/18/2020 0803   TRIG 81 11/21/2012 1025   HDL 51 08/18/2020 0803   HDL 45 11/21/2012 1025   CHOLHDL 2.5 08/18/2020 0803   VLDL 10 09/13/2016 0822   LDLCALC 61 08/18/2020 0803   LDLCALC  94 11/21/2012 1025     RADIOLOGY: No results found.  IMPRESSION:  1. Essential hypertension   2. Coronary artery disease involving native coronary artery of native heart without angina pectoris   3. CAD S/P percutaneous coronary angioplasty   4. OSA (obstructive sleep apnea) on CPAP   5. History of Mobitz 2 second-degree AV block   6. Pacemaker   7. Type 2 diabetes mellitus with complication, without long-term current use of insulin (HCC)     ASSESSMENT AND PLAN: Mr. Franek Is a 70 year old male who has CAD and is status post intervention to his proximal and mid LAD as well as his RCA.  A repeat cardiac catheterization by Dr. Ellyn Hack did not demonstrate significant cardiac etiology to his chest pain. He was empirically started on nitrate therapy but  this resulted in frequent migraine headaches.  He was hospitalized in April and again in May 2020 with syncope and was found to have multiple pauses ranging from 6,7,9 and 12 seconds and transient complete heart block with ventricular rate in the 20s.  He was off all AV nodal agents moving forward.  However due to current symptomatic Mobitz 2 second-degree AV block he underwent insertion of a dual-chamber [facility] Jude permanent pacemaker by Dr. Rayann Heman on May 01, 2019.  He has continued to be stable with reference to his cardiac rhythm.  His ECG today shows atrial sensing and ventricular pacing with prolonged AV conduction with a PR interval of 240 ms.  He has not had any anginal symptoms or shortness of breath.  His blood pressure today is stable on amlodipine 10 mg, furosemide 40 mg and telmisartan 40 mg daily.  He is on rosuvastatin 40 mg for hyperlipidemia with target LDL less than 70.  Lipid studies on August 18, 2020 showed total cholesterol 128, HDL 51, triglycerides 81 and LDL 61.  He is diabetic on Jardiance.  Hemoglobin A1c was 6.6.  He continues to use CPAP with excellent compliance.  However sleep duration is 5 hours and 30 minutes.  We  discussed more optimal sleep duration ideally at 7 to 8 hours per night.  Add a 14 cm set pressure, AHI is 1.1/h.  He will be undergoing repeat laboratory by Dr. Dennard Schaumann.  I will see him in 1 year for reevaluation or sooner as needed.  Troy Sine, MD, [facility]  10/26/2020 7:11 PM

## 2020-10-21 NOTE — Patient Instructions (Signed)
Medication Instructions:  Your Physician recommend you continue on your current medication as directed.    *If you need a refill on your cardiac medications before your next appointment, please call your pharmacy*   Lab Work: None   Testing/Procedures: None   Follow-Up: At [facility], you and your health needs are our priority.  As part of our continuing mission to provide you with exceptional heart care, we have created designated Provider Care Teams.  These Care Teams include your primary Cardiologist (physician) and Advanced Practice Providers (APPs -  Physician Assistants and Nurse Practitioners) who all work together to provide you with the care you need, when you need it.  We recommend signing up for the patient portal called "MyChart".  Sign up information is provided on this After Visit Summary.  MyChart is used to connect with patients for Virtual Visits (Telemedicine).  Patients are able to view lab/test results, encounter notes, upcoming appointments, etc.  Non-urgent messages can be sent to your provider as well.   To learn more about what you can do with MyChart, go to [email or web address].    Your next appointment:   1 year(s)  The format for your next appointment:   In Person  Provider:   Shelva Majestic, MD

## 2020-10-26 ENCOUNTER — Encounter: Payer: Self-pay | Admitting: Cardiovascular Disease

## 2020-10-28 ENCOUNTER — Ambulatory Visit (INDEPENDENT_AMBULATORY_CARE_PROVIDER_SITE_OTHER): Payer: Medicare Other

## 2020-10-28 DIAGNOSIS — I441 Atrioventricular block, second degree: Secondary | ICD-10-CM

## 2020-10-29 LAB — CUP PACEART REMOTE DEVICE CHECK
Battery Remaining Longevity: 108 mo
Battery Remaining Percentage: 95.5 %
Battery Voltage: 2.99 V
Brady Statistic AP VP Percent: 1 %
Brady Statistic AP VS Percent: 1 %
Brady Statistic AS VP Percent: 91 %
Brady Statistic AS VS Percent: 7.6 %
Brady Statistic RA Percent Paced: 1.3 %
Brady Statistic RV Percent Paced: 92 %
Date Time Interrogation Session: 20220531032822
Implantable Lead Implant Date: 20201201
Implantable Lead Implant Date: 20201201
Implantable Lead Location: 753859
Implantable Lead Location: 753860
Implantable Pulse Generator Implant Date: 20201201
Lead Channel Impedance Value: 480 Ohm
Lead Channel Impedance Value: 510 Ohm
Lead Channel Pacing Threshold Amplitude: 0.75 V
Lead Channel Pacing Threshold Amplitude: 1 V
Lead Channel Pacing Threshold Pulse Width: 0.5 ms
Lead Channel Pacing Threshold Pulse Width: 0.5 ms
Lead Channel Sensing Intrinsic Amplitude: 1.8 mV
Lead Channel Sensing Intrinsic Amplitude: 3.2 mV
Lead Channel Setting Pacing Amplitude: 2 V
Lead Channel Setting Pacing Amplitude: 2.5 V
Lead Channel Setting Pacing Pulse Width: 0.5 ms
Lead Channel Setting Sensing Sensitivity: 2 mV
Pulse Gen Model: 2272
Pulse Gen Serial Number: 9182765

## 2020-10-30 ENCOUNTER — Encounter: Payer: Self-pay | Admitting: Podiatry

## 2020-10-30 ENCOUNTER — Other Ambulatory Visit: Payer: Self-pay

## 2020-10-30 ENCOUNTER — Ambulatory Visit (INDEPENDENT_AMBULATORY_CARE_PROVIDER_SITE_OTHER): Payer: Medicare Other | Admitting: Podiatry

## 2020-10-30 DIAGNOSIS — D2371 Other benign neoplasm of skin of right lower limb, including hip: Secondary | ICD-10-CM

## 2020-10-30 DIAGNOSIS — M7751 Other enthesopathy of right foot: Secondary | ICD-10-CM

## 2020-10-30 DIAGNOSIS — D2372 Other benign neoplasm of skin of left lower limb, including hip: Secondary | ICD-10-CM

## 2020-10-30 MED ORDER — DEXAMETHASONE SODIUM PHOSPHATE 120 MG/30ML IJ SOLN
2.0000 mg | Freq: Once | INTRAMUSCULAR | Status: AC
  Administered 2020-10-30: 2 mg via INTRA_ARTICULAR

## 2020-11-01 NOTE — Progress Notes (Signed)
He presents today chief complaint of painful calluses bilaterally.  States that the right seems to be the worst.  Objective: Vital signs are stable he is alert oriented x3.  He still has bursitis of fifth met right foot multiple porokeratotic lesions benign skin lesions plantar aspect bilateral foot more prevalent along the lateral aspect and plantar lateral aspect of the fifth metatarsal of the right foot.  Assessment: Bursitis with benign skin lesions.  Plan: I injected the right bursa today with dexamethasone and local anesthetic.  And I debrided all of the benign skin lesions manually and chemically.  He will leave the salicylic acid on for about 2 to 3 days without getting wet and then wash off thoroughly.  I will follow-up with him as needed.

## 2020-11-11 ENCOUNTER — Other Ambulatory Visit: Payer: Self-pay | Admitting: Family Medicine

## 2020-11-16 ENCOUNTER — Other Ambulatory Visit: Payer: Self-pay | Admitting: Cardiovascular Disease

## 2020-11-18 ENCOUNTER — Ambulatory Visit: Payer: Medicare Other | Admitting: Podiatry

## 2020-11-19 NOTE — Progress Notes (Signed)
Remote pacemaker transmission.   

## 2020-11-21 ENCOUNTER — Encounter: Payer: Self-pay | Admitting: Family Medicine

## 2020-11-21 ENCOUNTER — Ambulatory Visit (INDEPENDENT_AMBULATORY_CARE_PROVIDER_SITE_OTHER): Payer: Medicare Other | Admitting: Family Medicine

## 2020-11-21 ENCOUNTER — Other Ambulatory Visit: Payer: Self-pay

## 2020-11-21 VITALS — BP 132/78 | HR 90 | Temp 98.4°F | Resp 14 | Ht 67.5 in | Wt 247.0 lb

## 2020-11-21 DIAGNOSIS — M25531 Pain in right wrist: Secondary | ICD-10-CM | POA: Diagnosis not present

## 2020-11-21 DIAGNOSIS — G8929 Other chronic pain: Secondary | ICD-10-CM | POA: Diagnosis not present

## 2020-11-21 DIAGNOSIS — M25561 Pain in right knee: Secondary | ICD-10-CM | POA: Diagnosis not present

## 2020-11-21 NOTE — Progress Notes (Signed)
Subjective:    Patient ID: Lee [name], male    DOB: [birth year], 70 y.o.   MRN: [id number]  HPI   Patient presents today with 2 concerns.  First he is having pain in his right wrist.  The pain is located near the distal radius as well as in the first MCP joint.  He has pain with flexion and extension at that joint.  He reports sharp burning pain radiating down his right.  He has a negative Tinel sign and a negative Phalen sign.  I do not feel that this is carpal tunnel but rather I feel the patient likely has first [facility] joint arthritis.  He denies any instability in the joint.  Therefore I recommended that he see a hand specialist rather than have me give him a "cortisone shot for carpal tunnel.  However he is requesting a cortisone shot for pain in his right knee.  He has pain with walking and standing.  He denies any laxity to varus or valgus stress.  Pain is primarily over the medial joint line Past Medical History:  Diagnosis Date   Allergic rhinitis    chronic sinusitis   Arthritis    CAD (coronary artery disease)    a. 2009 PCI/DES to mLAD; b. 05/2010 s/p DES RCA and LAD.; c. 05/2013 Cath: LAD mild ISR, LCX nl, RCA 40p, patent stents.   Cataract    has had lasik surg   COPD (chronic obstructive pulmonary disease) (HCC)    Diabetes mellitus type 2, controlled, without complications ([place])    Diabetes mellitus without complication ([place])    Phreesia 08/22/2020   Diverticulosis    DJD (degenerative joint disease)    Esophagitis 1991   grade 1   GERD (gastroesophageal reflux disease)    Hiatal hernia   Hemorrhoids    Hyperlipidemia    Hypertensive heart disease    Iron deficiency anemia    Migraines    Paroxysmal atrial fibrillation ([place])    a. s/p afib ablation 10/22/08 (J. Allred).   PVC's (premature ventricular contractions)    Sleep apnea    Questionalble: RDI during the total sleep time 6h 28 mins was 3.55/hr during REM sleep was at 5.71/hr. Supine AHI was 5.93/hr.    Syncope 08/2018   Past Surgical History:  Procedure Laterality Date   CARDIAC CATHETERIZATION  05/2010   The proximal LAD was then predilated with 2.0 x 12 trek. this was then stented with a 2.5 x 16 promus Element drug-eluting stent at 14 atmosphere and prostdilated with 2.75 x 12 [place] trek at 16 atmospheres(2.8 mm) resulting the reduction of 80% proximal LAD stenosis to 0% residual with excellent flow.   CARDIAC CATHETERIZATION  06/05/2010   Successful percutaneous coronary intervention to the right coronary artery with percutaneous transluminal coronary angioplasty/stenting and insertion 3.0 x 16 mm Promus DES post dilated to 3.35 mm with stenosis being reduced to 0%   CARDIAC CATHETERIZATION  02/2008   Post dilatation was performed using a 2.75 x 9 [place] sprinter, 10 atmospheres for 40 seconds and then 9 atmosphere for 35 sec. This resulted in the 80% area of narrowing pre-intervention, now appearing to normal. There was no edvidence of the dissection or thrombus and there was TIMI III flow pre and post.   CARDIAC CATHETERIZATION  02/2006   CORONARY ANGIOPLASTY WITH STENT PLACEMENT     3 stents/ 4 caths   EYE SURGERY     KNEE ARTHROSCOPY     right  LASIK     LEFT HEART CATHETERIZATION WITH CORONARY ANGIOGRAM N/A 06/29/2013   Procedure: LEFT HEART CATHETERIZATION WITH CORONARY ANGIOGRAM;  Surgeon: Leonie Man, MD;  Location: [facility] CATH LAB;  Service: Cardiovascular;  Laterality: N/A;   NASAL SINUS SURGERY  2001   NECK SURGERY     Cervical fusion   PACEMAKER IMPLANT N/A 05/01/2019   Procedure: PACEMAKER IMPLANT;  Surgeon: [name], MD;  Location: [place] CV LAB;  Service: Cardiovascular;  Laterality: N/A;   RADIOFREQUENCY ABLATION  May 2010   atrial fibrillation   ROTATOR CUFF REPAIR     left   SHOULDER OPEN ROTATOR CUFF REPAIR     right   SPINE SURGERY     WRIST ARTHROCENTESIS     left   Current Outpatient Medications on File Prior to Visit  Medication Sig Dispense Refill    Accu-Chek FastClix Lancets MISC USE TO CHECK BLOOD SUGAR TWICE DAILY 102 each 3   ACCU-CHEK GUIDE test strip USE TO CHECK BLOOD SUGAR 2 TIMES DAILY 100 strip 1   acetaminophen (TYLENOL) 325 MG tablet Take 1-2 tablets (325-650 mg total) by mouth every 4 (four) hours as needed for mild pain.     amLODipine (NORVASC) 10 MG tablet TAKE 1 TABLET(10 MG) BY MOUTH DAILY 90 tablet 3   aspirin EC 81 MG tablet Take 81 mg by mouth at bedtime.     Blood Glucose Monitoring Suppl (ACCU-CHEK AVIVA PLUS) w/Device KIT Check BS bid E11.9 1 kit 1   cholecalciferol (VITAMIN D) 1000 UNITS tablet Take 1,000 Units by mouth at bedtime.      citalopram (CELEXA) 40 MG tablet TAKE 1 TABLET(40 MG) BY MOUTH DAILY 90 tablet 1   Coenzyme Q10 (CO Q 10) 100 MG CAPS Take 100 mg by mouth daily with breakfast.      Erenumab-aooe (AIMOVIG) 140 MG/ML SOAJ Inject 140 mg into the skin every 30 (thirty) days. On or about the 1st of each month     fexofenadine (ALLEGRA) 180 MG tablet Take 180 mg by mouth daily with breakfast.      fluticasone (FLONASE) 50 MCG/ACT nasal spray INSTILL 2 SPRAYS IN THE  NOSE AT BEDTIME 48 g 3   furosemide (LASIX) 40 MG tablet TAKE 1 TABLET BY MOUTH  DAILY 90 tablet 3   JARDIANCE 25 MG TABS tablet TAKE 1 TABLET BY MOUTH DAILY BEFORE BREAKFAST 30 tablet 5   Lancets Misc. (ACCU-CHEK FASTCLIX LANCET) KIT USE AS DIRECTED TO CHECK BLOOD SUGAR TWICE DAILY. DX: E11.9 1 kit 1   Multiple Vitamin (MULTIVITAMIN WITH MINERALS) TABS tablet Take 1 tablet by mouth daily with breakfast.     nitroGLYCERIN (NITROSTAT) 0.4 MG SL tablet PLACE ONE TABLET UNDER THE TONGUE EVERY 5 MINUTES AS NEEDED FOR CHEST PAIN 25 tablet 3   oxyCODONE-acetaminophen (PERCOCET) 10-325 MG tablet Take 1 tablet by mouth every 8 (eight) hours as needed for pain. 21 tablet 0   pantoprazole (PROTONIX) 40 MG tablet TAKE 1 TABLET BY MOUTH  DAILY 90 tablet 3   Polyvinyl Alcohol-Povidone (REFRESH OP) Place 1 drop into both eyes at bedtime as needed (dry eyes).      potassium chloride (MICRO-K) 10 MEQ CR capsule TAKE 1 CAPSULE BY MOUTH  DAILY 90 capsule 3   rosuvastatin (CRESTOR) 40 MG tablet TAKE 1 TABLET BY MOUTH  DAILY 90 tablet 3   telmisartan (MICARDIS) 40 MG tablet TAKE 1 TABLET BY MOUTH  DAILY 90 tablet 3   No current facility-administered medications  on file prior to visit.   Allergies  Allergen Reactions   Ibuprofen Swelling    Whole body swelled   Other Shortness Of Breath    BETA BLOCKERS   Topamax [Topiramate] Other (See Comments)    Pt states it made his tongue feel "scalded" and he couldn't eat    Toprol Xl [Metoprolol Succinate] Other (See Comments)    Personality change   Metoprolol-Hctz Er Other (See Comments)    Indigestion,mouth raw   Social History   Socioeconomic History   Marital status: Married    Spouse name: Not on file   Number of children: 2   Years of education: Not on file   Highest education level: Not on file  Occupational History   Occupation: retired  Tobacco Use   Smoking status: Former    Pack years: 0.00    Types: Cigarettes    Quit date: 06/01/1987    Years since quitting: 33.4   Smokeless tobacco: Never  Vaping Use   Vaping Use: Never used  Substance and Sexual Activity   Alcohol use: Yes    Alcohol/week: 0.0 standard drinks    Comment: once every 6-7 months   Drug use: No   Sexual activity: Not on file  Other Topics Concern   Not on file  Social History Narrative   Not on file   Social Determinants of Health   Financial Resource Strain: Not on file  Food Insecurity: Not on file  Transportation Needs: Not on file  Physical Activity: Not on file  Stress: Not on file  Social Connections: Not on file  Intimate Partner Violence: Not on file      Review of Systems     Objective:   Physical Exam Vitals reviewed.  Constitutional:      General: He is not in acute distress.    Appearance: He is not ill-appearing, toxic-appearing or diaphoretic.  Cardiovascular:     Rate and  Rhythm: Normal rate.     Heart sounds: Normal heart sounds.  Pulmonary:     Effort: Pulmonary effort is normal. No respiratory distress.     Breath sounds: No stridor.  Musculoskeletal:     Right hand: Tenderness and bony tenderness present. Decreased range of motion.     Right knee: Decreased range of motion. Tenderness present over the medial joint line.     Right lower leg: No edema.     Left lower leg: No edema.  Neurological:     Mental Status: He is alert.          Assessment & Plan:  Wrist pain, chronic, right - Plan: Ambulatory referral to Hand Surgery  Acute pain of right knee I will send the patient to see a hand specialist.  I believe that he has first [facility] joint arthritis.  That is not a injection that I performed.  However he does have osteoarthritis in his right knee.  Using sterile technique, I injected the right knee with 2 cc of lidocaine, 2 cc of Marcaine, and 2 cc of 40 mg/mL Kenalog.  He tolerated the procedure well without complication.

## 2020-12-04 ENCOUNTER — Ambulatory Visit (INDEPENDENT_AMBULATORY_CARE_PROVIDER_SITE_OTHER): Payer: Medicare Other | Admitting: Orthopaedic Surgery

## 2020-12-04 ENCOUNTER — Ambulatory Visit: Payer: Self-pay

## 2020-12-04 ENCOUNTER — Encounter: Payer: Self-pay | Admitting: Orthopaedic Surgery

## 2020-12-04 DIAGNOSIS — M1811 Unilateral primary osteoarthritis of first carpometacarpal joint, right hand: Secondary | ICD-10-CM | POA: Diagnosis not present

## 2020-12-04 MED ORDER — LIDOCAINE HCL 1 % IJ SOLN
0.3000 mL | INTRAMUSCULAR | Status: AC | PRN
  Administered 2020-12-04: .3 mL

## 2020-12-04 MED ORDER — METHYLPREDNISOLONE ACETATE 40 MG/ML IJ SUSP
13.3300 mg | INTRAMUSCULAR | Status: AC | PRN
  Administered 2020-12-04: 13.33 mg

## 2020-12-04 MED ORDER — BUPIVACAINE HCL 0.5 % IJ SOLN
0.3300 mL | INTRAMUSCULAR | Status: AC | PRN
  Administered 2020-12-04: .33 mL

## 2020-12-04 NOTE — Progress Notes (Signed)
Office Visit Note   Patient: Lee [name]           Date of Birth: January 07, 1951           MRN: [id number] Visit Date: 12/04/2020              Requested by: Susy Frizzle, MD [address] [place],  [place] [address] PCP: Susy Frizzle, MD   Assessment & Plan: Visit Diagnoses:  1. Primary osteoarthritis of first carpometacarpal joint of right hand     Plan: Impression is right thumb CMC osteoarthritis. We dicussed the treatment options including bracing, injections, and surgery as a last resort. Patient would like to proceed with the injection which was done in clinic today. We will see him back as needed.   Follow-Up Instructions: Return if symptoms worsen or fail to improve.   Orders:  Orders Placed This Encounter  Procedures   Hand/UE Inj   XR Finger Thumb Right   No orders of the defined types were placed in this encounter.     Procedures: Hand/UE Inj: R thumb CMC for osteoarthritis on 12/04/2020 8:46 PM Indications: pain Details: 25 G needle Medications: 0.3 mL lidocaine 1 %; 0.33 mL bupivacaine 0.5 %; 13.33 mg methylPREDNISolone acetate 40 MG/ML Outcome: tolerated well, no immediate complications     Clinical Data: No additional findings.   Subjective: Chief Complaint  Patient presents with   Right Thumb - Pain    HPI Lee [name] is a 70 y.o. LHD male who presents for evaluation of chronic right thumb pain for 3-4 years without a precipitating injury. He describes intermittent burning, achy and stabbing pain localized to the base of the right thumb. He has trouble with pinching and grasping. He denies numbness, tingling or weakness in the hand. Past treatment includes a compression glove and Tylenol as needed. He has not done any bracing, hand therapy or had any CMC injections. He underwent a left [facility] arthroplasty with Dr. Daylene Katayama 12 years ago. His right hand/thumb symptoms feel similar to the left side prior to his surgery. He takes  aspirin 81 mg daily.   Review of Systems Review of Systems was reviewed and negative unless as stated in the HPI.  Objective: Vital Signs: There were no vitals taken for this visit.  Physical Exam Vitals and nursing note reviewed.  Constitutional:      Appearance: He is well-developed.  HENT:     Head: Normocephalic and atraumatic.  Eyes:     Pupils: Pupils are equal, round, and reactive to light.  Pulmonary:     Effort: Pulmonary effort is normal.  Abdominal:     Palpations: Abdomen is soft.  Musculoskeletal:        General: Normal range of motion.     Cervical back: Neck supple.  Skin:    General: Skin is warm.  Neurological:     Mental Status: He is alert and oriented to person, place, and time.  Psychiatric:        Behavior: Behavior normal.        Thought Content: Thought content normal.        Judgment: Judgment normal.    Ortho Exam Right hand exam demonstrates no obvious swelling or deformity. He is tender over the right thumb CMC joint. No MCP or IP joint tenderness. Full ROM with thumb abduction, adduction, flexion and opposition. Crepitus only with CMC grind. Distal neurovascular exam intact.   Specialty Comments:  No specialty  comments available.  Imaging: Radiographs of the right thumb dated 12/04/20 were reviewed and demonstrate osteoarthritis of the [facility] joint.   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 04/30/2019   Syncope and collapse 10/10/2018   Heart block AV complete ([place]) 10/10/2018   Symptomatic bradycardia 10/10/2018   Syncope, vasovagal 09/20/2018   Neck mass 10/14/2016   Depression 05/20/2016   Hypertensive heart disease    Paroxysmal atrial fibrillation ([place])    Hyperlipidemia LDL goal <70 03/27/2014   Premature ventricular contraction 03/04/2014   Chest pain with high risk of acute coronary syndrome 06/28/2013   Morbid obesity ([place]) 06/28/2013   OSA (obstructive sleep apnea)  12/18/2012   Sleepiness 11/06/2012   Fatigue 11/06/2012   PALPITATIONS 06/02/2009   DYSLIPIDEMIA 09/05/2008   Essential hypertension 09/05/2008   CAD S/P percutaneous coronary angioplasty 09/05/2008   RBBB 09/05/2008   SUPRAVENTRICULAR TACHYCARDIA 09/05/2008   ALLERGIC RHINITIS 09/05/2008   GERD 09/05/2008   BACK PAIN 09/05/2008   ARRHYTHMIA, HX OF 09/05/2008   MIGRAINES, HX OF 09/05/2008   Dyslipidemia 09/05/2008   Past Medical History:  Diagnosis Date   Allergic rhinitis    chronic sinusitis   Arthritis    CAD (coronary artery disease)    a. 2009 PCI/DES to mLAD; b. 05/2010 s/p DES RCA and LAD.; c. 05/2013 Cath: LAD mild ISR, LCX nl, RCA 40p, patent stents.   Cataract    has had lasik surg   COPD (chronic obstructive pulmonary disease) (HCC)    Diabetes mellitus type 2, controlled, without complications ([place])    Diabetes mellitus without complication ([place])    Phreesia 08/22/2020   Diverticulosis    DJD (degenerative joint disease)    Esophagitis 1991   grade 1   GERD (gastroesophageal reflux disease)    Hiatal hernia   Hemorrhoids    Hyperlipidemia    Hypertensive heart disease    Iron deficiency anemia    Migraines    Paroxysmal atrial fibrillation ([place])    a. s/p afib ablation 10/22/08 (J. Allred).   PVC's (premature ventricular contractions)    Sleep apnea    Questionalble: RDI during the total sleep time 6h 28 mins was 3.55/hr during REM sleep was at 5.71/hr. Supine AHI was 5.93/hr.   Syncope 08/2018    Family History  Problem Relation Age of Onset   Heart disease Father        and sister   Diabetes Father    Colon cancer Mother        mets from uterine   Uterine cancer Mother    Heart disease Sister     Past Surgical History:  Procedure Laterality Date   CARDIAC CATHETERIZATION  05/2010   The proximal LAD was then predilated with 2.0 x 12 trek. this was then stented with a 2.5 x 16 promus Element drug-eluting stent at 14 atmosphere and prostdilated with  2.75 x 12 [place] trek at 16 atmospheres(2.8 mm) resulting the reduction of 80% proximal LAD stenosis to 0% residual with excellent flow.   CARDIAC CATHETERIZATION  06/05/2010   Successful percutaneous coronary intervention to the right coronary artery with percutaneous transluminal coronary angioplasty/stenting and insertion 3.0 x 16 mm Promus DES post dilated to 3.35 mm with stenosis being reduced to 0%   CARDIAC CATHETERIZATION  02/2008   Post dilatation was performed using a 2.75 x 9 [place] sprinter, 10 atmospheres for 40 seconds and then 9 atmosphere for  35 sec. This resulted in the 80% area of narrowing pre-intervention, now appearing to normal. There was no edvidence of the dissection or thrombus and there was TIMI III flow pre and post.   CARDIAC CATHETERIZATION  02/2006   CORONARY ANGIOPLASTY WITH STENT PLACEMENT     3 stents/ 4 caths   EYE SURGERY     KNEE ARTHROSCOPY     right   LASIK     LEFT HEART CATHETERIZATION WITH CORONARY ANGIOGRAM N/A 06/29/2013   Procedure: LEFT HEART CATHETERIZATION WITH CORONARY ANGIOGRAM;  Surgeon: Leonie Man, MD;  Location: [facility] CATH LAB;  Service: Cardiovascular;  Laterality: N/A;   NASAL SINUS SURGERY  2001   NECK SURGERY     Cervical fusion   PACEMAKER IMPLANT N/A 05/01/2019   Procedure: PACEMAKER IMPLANT;  Surgeon: [name], MD;  Location: [place] CV LAB;  Service: Cardiovascular;  Laterality: N/A;   RADIOFREQUENCY ABLATION  May 2010   atrial fibrillation   ROTATOR CUFF REPAIR     left   SHOULDER OPEN ROTATOR CUFF REPAIR     right   SPINE SURGERY     WRIST ARTHROCENTESIS     left   Social History   Occupational History   Occupation: retired  Tobacco Use   Smoking status: Former    Pack years: 0.00    Types: Cigarettes    Quit date: 06/01/1987    Years since quitting: 33.5   Smokeless tobacco: Never  Vaping Use   Vaping Use: Never used  Substance and Sexual Activity   Alcohol use: Yes    Alcohol/week: 0.0 standard drinks     Comment: once every 6-7 months   Drug use: No   Sexual activity: Not on file

## 2021-01-17 ENCOUNTER — Other Ambulatory Visit: Payer: Self-pay | Admitting: Cardiovascular Disease

## 2021-01-23 ENCOUNTER — Other Ambulatory Visit: Payer: Self-pay | Admitting: *Deleted

## 2021-01-23 NOTE — Telephone Encounter (Signed)
Received call from patient.   Requested refill on Oxycodone/APAP.   Ok to refill??  Last office visit 11/21/2020.  Last refill 04/15/2020.

## 2021-01-26 MED ORDER — OXYCODONE-ACETAMINOPHEN 10-325 MG PO TABS
1.0000 | ORAL_TABLET | Freq: Three times a day (TID) | ORAL | 0 refills | Status: DC | PRN
Start: 2021-01-26 — End: 2021-05-05

## 2021-01-27 ENCOUNTER — Ambulatory Visit (INDEPENDENT_AMBULATORY_CARE_PROVIDER_SITE_OTHER): Payer: Medicare Other

## 2021-01-27 DIAGNOSIS — I441 Atrioventricular block, second degree: Secondary | ICD-10-CM

## 2021-01-28 LAB — CUP PACEART REMOTE DEVICE CHECK
Battery Remaining Longevity: 86 mo
Battery Remaining Percentage: 84 %
Battery Voltage: 2.99 V
Brady Statistic AP VP Percent: 1 %
Brady Statistic AP VS Percent: 1 %
Brady Statistic AS VP Percent: 91 %
Brady Statistic AS VS Percent: 8.2 %
Brady Statistic RA Percent Paced: 1 %
Brady Statistic RV Percent Paced: 91 %
Date Time Interrogation Session: 20220831061032
Implantable Lead Implant Date: 20201201
Implantable Lead Implant Date: 20201201
Implantable Lead Location: 753859
Implantable Lead Location: 753860
Implantable Pulse Generator Implant Date: 20201201
Lead Channel Impedance Value: 460 Ohm
Lead Channel Impedance Value: 460 Ohm
Lead Channel Pacing Threshold Amplitude: 0.75 V
Lead Channel Pacing Threshold Amplitude: 1 V
Lead Channel Pacing Threshold Pulse Width: 0.5 ms
Lead Channel Pacing Threshold Pulse Width: 0.5 ms
Lead Channel Sensing Intrinsic Amplitude: 2.1 mV
Lead Channel Sensing Intrinsic Amplitude: 4.7 mV
Lead Channel Setting Pacing Amplitude: 2 V
Lead Channel Setting Pacing Amplitude: 2.5 V
Lead Channel Setting Pacing Pulse Width: 0.5 ms
Lead Channel Setting Sensing Sensitivity: 2 mV
Pulse Gen Model: 2272
Pulse Gen Serial Number: 9182765

## 2021-02-03 ENCOUNTER — Ambulatory Visit (INDEPENDENT_AMBULATORY_CARE_PROVIDER_SITE_OTHER): Payer: Medicare Other | Admitting: Podiatry

## 2021-02-03 ENCOUNTER — Encounter: Payer: Self-pay | Admitting: Podiatry

## 2021-02-03 ENCOUNTER — Other Ambulatory Visit: Payer: Self-pay

## 2021-02-03 DIAGNOSIS — D2371 Other benign neoplasm of skin of right lower limb, including hip: Secondary | ICD-10-CM

## 2021-02-03 DIAGNOSIS — M7751 Other enthesopathy of right foot: Secondary | ICD-10-CM | POA: Diagnosis not present

## 2021-02-03 DIAGNOSIS — D2372 Other benign neoplasm of skin of left lower limb, including hip: Secondary | ICD-10-CM | POA: Diagnosis not present

## 2021-02-03 MED ORDER — DEXAMETHASONE SODIUM PHOSPHATE 120 MG/30ML IJ SOLN
2.0000 mg | Freq: Once | INTRAMUSCULAR | Status: AC
  Administered 2021-02-03: 2 mg via INTRA_ARTICULAR

## 2021-02-03 NOTE — Progress Notes (Signed)
He presents today for follow-up of his subfifth metatarsal head benign skin lesions.  He did state that he and his wife had COVID for about 3 to 4 weeks  Objective: Vital signs are stable alert and oriented x3.  His pulses are still palpable.  He does not demonstrate any porokeratotic lesions of the fifth metatarsal heads bilaterally but he does demonstrate some mild erythema fluctuance and a benign skin lesion base of the fifth metatarsal on the right foot.  Assessment: Benign skin lesion with bursitis of fifth metatarsal base right foot.  Plan: Debridement the area today.  Also injected the bursa today with 2 mg of dexamethasone and local anesthetic.

## 2021-02-09 NOTE — Progress Notes (Signed)
Remote pacemaker transmission.   

## 2021-03-11 LAB — HM DIABETES EYE EXAM

## 2021-03-12 ENCOUNTER — Encounter: Payer: Self-pay | Admitting: Family Medicine

## 2021-03-12 NOTE — Progress Notes (Signed)
No retinopathy detected

## 2021-04-19 ENCOUNTER — Other Ambulatory Visit: Payer: Self-pay | Admitting: Family Medicine

## 2021-05-05 ENCOUNTER — Ambulatory Visit: Payer: Medicare Other | Admitting: Podiatry

## 2021-05-05 ENCOUNTER — Telehealth: Payer: Self-pay

## 2021-05-05 MED ORDER — OXYCODONE-ACETAMINOPHEN 10-325 MG PO TABS: 1.0000 | ORAL_TABLET | Freq: Three times a day (TID) | ORAL | 0 refills | Status: DC | PRN

## 2021-05-05 NOTE — Telephone Encounter (Signed)
Pt called in requesting a refill of oxyCODONE-acetaminophen (PERCOCET) 10-325 MG tablet   Cb#: [id number]

## 2021-05-14 ENCOUNTER — Ambulatory Visit (INDEPENDENT_AMBULATORY_CARE_PROVIDER_SITE_OTHER): Payer: Medicare Other | Admitting: Podiatry

## 2021-05-14 ENCOUNTER — Encounter: Payer: Self-pay | Admitting: Podiatry

## 2021-05-14 ENCOUNTER — Other Ambulatory Visit: Payer: Self-pay

## 2021-05-14 DIAGNOSIS — M7751 Other enthesopathy of right foot: Secondary | ICD-10-CM | POA: Diagnosis not present

## 2021-05-14 DIAGNOSIS — D2371 Other benign neoplasm of skin of right lower limb, including hip: Secondary | ICD-10-CM

## 2021-05-14 DIAGNOSIS — D2372 Other benign neoplasm of skin of left lower limb, including hip: Secondary | ICD-10-CM

## 2021-05-14 MED ORDER — DEXAMETHASONE SODIUM PHOSPHATE 120 MG/30ML IJ SOLN
2.0000 mg | Freq: Once | INTRAMUSCULAR | Status: AC
  Administered 2021-05-14: 2 mg via INTRA_ARTICULAR

## 2021-05-16 NOTE — Progress Notes (Signed)
Presents today for follow-up.  States that this area is still really painful as he refers to the plantar fifth metatarsal head area right.  Objective: Vital signs stable oriented x3 benign skin lesion is still present though he has a palpable bursitis beneath the fifth metatarsal head.  Assessment: Bursitis and benign skin lesions of fifth right.  Plan: Debrided subfifth right lesion, benign skin lesion and I injected 2 mg of dexamethasone and local anesthetic into the bursa.  Follow-up with him as needed.

## 2021-05-20 ENCOUNTER — Encounter: Payer: Self-pay | Admitting: Orthopaedic Surgery

## 2021-05-20 ENCOUNTER — Other Ambulatory Visit: Payer: Self-pay

## 2021-05-20 ENCOUNTER — Ambulatory Visit (INDEPENDENT_AMBULATORY_CARE_PROVIDER_SITE_OTHER): Payer: Medicare Other | Admitting: Orthopaedic Surgery

## 2021-05-20 DIAGNOSIS — M1811 Unilateral primary osteoarthritis of first carpometacarpal joint, right hand: Secondary | ICD-10-CM

## 2021-05-20 NOTE — Progress Notes (Signed)
Office Visit Note   Patient: Lee [name]           Date of Birth: [birth year]           MRN: [id number] Visit Date: 05/20/2021              Requested by: Susy Frizzle, MD [address] [place],  [place] [address] PCP: Susy Frizzle, MD   Assessment & Plan: Visit Diagnoses:  1. Primary osteoarthritis of first carpometacarpal joint of right hand     Plan: Mr. Struckman returns today for follow-up of his right thumb CMC arthritis.  We saw him back in July and gave him a cortisone injection which helped for about 4 months.  The pain has unfortunately come back.  He is hoping to get another injection today.  He is also interested in pursuing [facility] arthroplasty sometime next year.  Right hand exam is unchanged.  Impression is right thumb CMC arthritis.  He had good relief from the prior injection so he agreed to repeat it today.  [name] will call the patient to schedule surgery as soon as we have preoperative clearance.  Follow-Up Instructions: No follow-ups on file.   Orders:  No orders of the defined types were placed in this encounter.  No orders of the defined types were placed in this encounter.     Procedures: Small Joint Inj: R thumb CMC on 05/20/2021 9:47 AM Indications: pain Details: 22 G needle Medications: 0.3 mL lidocaine 1 %; 0.33 mL bupivacaine 0.5 %; 13.33 mg methylPREDNISolone acetate 40 MG/ML Outcome: tolerated well, no immediate complications Patient was prepped and draped in the usual sterile fashion.      Clinical Data: No additional findings.   Subjective: Chief Complaint  Patient presents with   Right Wrist - Pain    HPI  Review of Systems   Objective: Vital Signs: There were no vitals taken for this visit.  Physical Exam  Ortho Exam  Specialty Comments:  No specialty comments available.  Imaging: No results found.   PMFS History: Patient Active Problem List   Diagnosis Date Noted   Second degree Mobitz II  AV block 04/30/2019   Syncope and collapse 10/10/2018   Heart block AV complete ([place]) 10/10/2018   Symptomatic bradycardia 10/10/2018   Syncope, vasovagal 09/20/2018   Neck mass 10/14/2016   Depression 05/20/2016   Hypertensive heart disease    Paroxysmal atrial fibrillation ([place])    Hyperlipidemia LDL goal <70 03/27/2014   Premature ventricular contraction 03/04/2014   Chest pain with high risk of acute coronary syndrome 06/28/2013   Morbid obesity ([place]) 06/28/2013   OSA (obstructive sleep apnea) 12/18/2012   Sleepiness 11/06/2012   Fatigue 11/06/2012   PALPITATIONS 06/02/2009   DYSLIPIDEMIA 09/05/2008   Essential hypertension 09/05/2008   CAD S/P percutaneous coronary angioplasty 09/05/2008   RBBB 09/05/2008   SUPRAVENTRICULAR TACHYCARDIA 09/05/2008   ALLERGIC RHINITIS 09/05/2008   GERD 09/05/2008   BACK PAIN 09/05/2008   ARRHYTHMIA, HX OF 09/05/2008   MIGRAINES, HX OF 09/05/2008   Dyslipidemia 09/05/2008   Past Medical History:  Diagnosis Date   Allergic rhinitis    chronic sinusitis   Arthritis    CAD (coronary artery disease)    a. 2009 PCI/DES to mLAD; b. 05/2010 s/p DES RCA and LAD.; c. 05/2013 Cath: LAD mild ISR, LCX nl, RCA 40p, patent stents.   Cataract    has had lasik surg   COPD (chronic obstructive pulmonary disease) ([place])  Diabetes mellitus type 2, controlled, without complications ([place])    Diabetes mellitus without complication ([place])    Phreesia 08/22/2020   Diverticulosis    DJD (degenerative joint disease)    Esophagitis 1991   grade 1   GERD (gastroesophageal reflux disease)    Hiatal hernia   Hemorrhoids    Hyperlipidemia    Hypertensive heart disease    Iron deficiency anemia    Migraines    Paroxysmal atrial fibrillation ([place])    a. s/p afib ablation 10/22/08 (J. Allred).   PVC's (premature ventricular contractions)    Sleep apnea    Questionalble: RDI during the total sleep time 6h 28 mins was 3.55/hr during REM sleep was at 5.71/hr.  Supine AHI was 5.93/hr.   Syncope 08/2018    Family History  Problem Relation Age of Onset   Heart disease Father        and sister   Diabetes Father    Colon cancer Mother        mets from uterine   Uterine cancer Mother    Heart disease Sister     Past Surgical History:  Procedure Laterality Date   CARDIAC CATHETERIZATION  05/2010   The proximal LAD was then predilated with 2.0 x 12 trek. this was then stented with a 2.5 x 16 promus Element drug-eluting stent at 14 atmosphere and prostdilated with 2.75 x 12 [place] trek at 16 atmospheres(2.8 mm) resulting the reduction of 80% proximal LAD stenosis to 0% residual with excellent flow.   CARDIAC CATHETERIZATION  06/05/2010   Successful percutaneous coronary intervention to the right coronary artery with percutaneous transluminal coronary angioplasty/stenting and insertion 3.0 x 16 mm Promus DES post dilated to 3.35 mm with stenosis being reduced to 0%   CARDIAC CATHETERIZATION  02/2008   Post dilatation was performed using a 2.75 x 9 [place] sprinter, 10 atmospheres for 40 seconds and then 9 atmosphere for 35 sec. This resulted in the 80% area of narrowing pre-intervention, now appearing to normal. There was no edvidence of the dissection or thrombus and there was TIMI III flow pre and post.   CARDIAC CATHETERIZATION  02/2006   CORONARY ANGIOPLASTY WITH STENT PLACEMENT     3 stents/ 4 caths   EYE SURGERY     KNEE ARTHROSCOPY     right   LASIK     LEFT HEART CATHETERIZATION WITH CORONARY ANGIOGRAM N/A 06/29/2013   Procedure: LEFT HEART CATHETERIZATION WITH CORONARY ANGIOGRAM;  Surgeon: Leonie Man, MD;  Location[facility] CATH LAB;  Service: Cardiovascular;  Laterality: N/A;   NASAL SINUS SURGERY  2001   NECK SURGERY     Cervical fusion   PACEMAKER IMPLANT N/A 05/01/2019   Procedure: PACEMAKER IMPLANT;  Surgeon: [name], MD;  Location: [place] CV LAB;  Service: Cardiovascular;  Laterality: N/A;   RADIOFREQUENCY ABLATION  May 2010    atrial fibrillation   ROTATOR CUFF REPAIR     left   SHOULDER OPEN ROTATOR CUFF REPAIR     right   SPINE SURGERY     WRIST ARTHROCENTESIS     left   Social History   Occupational History   Occupation: retired  Tobacco Use   Smoking status: Former    Types: Cigarettes    Quit date: 06/01/1987    Years since quitting: 33.9   Smokeless tobacco: Never  Vaping Use   Vaping Use: Never used  Substance and Sexual Activity   Alcohol use: Yes    Alcohol/week:  0.0 standard drinks    Comment: once every 6-7 months   Drug use: No   Sexual activity: Not on file

## 2021-05-21 MED ORDER — LIDOCAINE HCL 1 % IJ SOLN
0.3000 mL | INTRAMUSCULAR | Status: AC | PRN
  Administered 2021-05-20: 10:00:00 .3 mL

## 2021-05-21 MED ORDER — BUPIVACAINE HCL 0.5 % IJ SOLN
0.3300 mL | INTRAMUSCULAR | Status: AC | PRN
  Administered 2021-05-20: 10:00:00 .33 mL via INTRA_ARTICULAR

## 2021-05-21 MED ORDER — METHYLPREDNISOLONE ACETATE 40 MG/ML IJ SUSP
13.3300 mg | INTRAMUSCULAR | Status: AC | PRN
  Administered 2021-05-20: 10:00:00 13.33 mg via INTRA_ARTICULAR

## 2021-05-29 ENCOUNTER — Ambulatory Visit (INDEPENDENT_AMBULATORY_CARE_PROVIDER_SITE_OTHER): Payer: Medicare Other

## 2021-05-29 DIAGNOSIS — I441 Atrioventricular block, second degree: Secondary | ICD-10-CM | POA: Diagnosis not present

## 2021-05-29 LAB — CUP PACEART REMOTE DEVICE CHECK
Battery Remaining Longevity: 84 mo
Battery Remaining Percentage: 81 %
Battery Voltage: 2.99 V
Brady Statistic AP VP Percent: 1 %
Brady Statistic AP VS Percent: 1 %
Brady Statistic AS VP Percent: 94 %
Brady Statistic AS VS Percent: 4.6 %
Brady Statistic RA Percent Paced: 1 %
Brady Statistic RV Percent Paced: 95 %
Date Time Interrogation Session: 20221230093958
Implantable Lead Implant Date: 20201201
Implantable Lead Implant Date: 20201201
Implantable Lead Location: 753859
Implantable Lead Location: 753860
Implantable Pulse Generator Implant Date: 20201201
Lead Channel Impedance Value: 460 Ohm
Lead Channel Impedance Value: 490 Ohm
Lead Channel Pacing Threshold Amplitude: 0.75 V
Lead Channel Pacing Threshold Amplitude: 1 V
Lead Channel Pacing Threshold Pulse Width: 0.5 ms
Lead Channel Pacing Threshold Pulse Width: 0.5 ms
Lead Channel Sensing Intrinsic Amplitude: 2.7 mV
Lead Channel Sensing Intrinsic Amplitude: 3.6 mV
Lead Channel Setting Pacing Amplitude: 2 V
Lead Channel Setting Pacing Amplitude: 2.5 V
Lead Channel Setting Pacing Pulse Width: 0.5 ms
Lead Channel Setting Sensing Sensitivity: 2 mV
Pulse Gen Model: 2272
Pulse Gen Serial Number: 9182765

## 2021-06-10 NOTE — Progress Notes (Signed)
Remote pacemaker transmission.   

## 2021-06-22 ENCOUNTER — Encounter: Payer: Self-pay | Admitting: Family Medicine

## 2021-06-22 ENCOUNTER — Ambulatory Visit (INDEPENDENT_AMBULATORY_CARE_PROVIDER_SITE_OTHER): Payer: Medicare Other | Admitting: Family Medicine

## 2021-06-22 ENCOUNTER — Other Ambulatory Visit: Payer: Self-pay

## 2021-06-22 VITALS — BP 124/72 | HR 79 | Temp 97.2°F | Resp 18 | Ht 67.5 in | Wt 247.0 lb

## 2021-06-22 DIAGNOSIS — M25561 Pain in right knee: Secondary | ICD-10-CM

## 2021-06-22 DIAGNOSIS — E118 Type 2 diabetes mellitus with unspecified complications: Secondary | ICD-10-CM | POA: Diagnosis not present

## 2021-06-22 NOTE — Progress Notes (Signed)
Subjective:    Patient ID: Lee [name], male    DOB: August 23, 1950, 71 y.o.   MRN: [id number]  HPI  Patient presents today complaining of pain in his right knee.  He has pain with standing in a seated position.  He has pain with walking.  He has pain with squatting.  There is no erythema.  There is no effusion.  There is no warmth. Past Medical History:  Diagnosis Date   Allergic rhinitis    chronic sinusitis   Arthritis    CAD (coronary artery disease)    a. 2009 PCI/DES to mLAD; b. 05/2010 s/p DES RCA and LAD.; c. 05/2013 Cath: LAD mild ISR, LCX nl, RCA 40p, patent stents.   Cataract    has had lasik surg   COPD (chronic obstructive pulmonary disease) ([facility])    Diabetes mellitus type 2, controlled, without complications ([place])    Diabetes mellitus without complication ([place])    Phreesia 08/22/2020   Diverticulosis    DJD (degenerative joint disease)    Esophagitis 1991   grade 1   GERD (gastroesophageal reflux disease)    Hiatal hernia   Hemorrhoids    Hyperlipidemia    Hypertensive heart disease    Iron deficiency anemia    Migraines    Paroxysmal atrial fibrillation ([place])    a. s/p afib ablation 10/22/08 (J. Allred).   PVC's (premature ventricular contractions)    Sleep apnea    Questionalble: RDI during the total sleep time 6h 28 mins was 3.55/hr during REM sleep was at 5.71/hr. Supine AHI was 5.93/hr.   Syncope 08/2018   Past Surgical History:  Procedure Laterality Date   CARDIAC CATHETERIZATION  05/2010   The proximal LAD was then predilated with 2.0 x 12 trek. this was then stented with a 2.5 x 16 promus Element drug-eluting stent at 14 atmosphere and prostdilated with 2.75 x 12 [place] trek at 16 atmospheres(2.8 mm) resulting the reduction of 80% proximal LAD stenosis to 0% residual with excellent flow.   CARDIAC CATHETERIZATION  06/05/2010   Successful percutaneous coronary intervention to the right coronary artery with percutaneous transluminal coronary  angioplasty/stenting and insertion 3.0 x 16 mm Promus DES post dilated to 3.35 mm with stenosis being reduced to 0%   CARDIAC CATHETERIZATION  02/2008   Post dilatation was performed using a 2.75 x 9 [place] sprinter, 10 atmospheres for 40 seconds and then 9 atmosphere for 35 sec. This resulted in the 80% area of narrowing pre-intervention, now appearing to normal. There was no edvidence of the dissection or thrombus and there was TIMI III flow pre and post.   CARDIAC CATHETERIZATION  02/2006   CORONARY ANGIOPLASTY WITH STENT PLACEMENT     3 stents/ 4 caths   EYE SURGERY     KNEE ARTHROSCOPY     right   LASIK     LEFT HEART CATHETERIZATION WITH CORONARY ANGIOGRAM N/A 06/29/2013   Procedure: LEFT HEART CATHETERIZATION WITH CORONARY ANGIOGRAM;  Surgeon: Leonie Man, MD;  Location: [facility] CATH LAB;  Service: Cardiovascular;  Laterality: N/A;   NASAL SINUS SURGERY  2001   NECK SURGERY     Cervical fusion   PACEMAKER IMPLANT N/A 05/01/2019   Procedure: PACEMAKER IMPLANT;  Surgeon: [name], MD;  Location: [place] CV LAB;  Service: Cardiovascular;  Laterality: N/A;   RADIOFREQUENCY ABLATION  May 2010   atrial fibrillation   ROTATOR CUFF REPAIR     left   SHOULDER OPEN ROTATOR CUFF REPAIR  right   SPINE SURGERY     WRIST ARTHROCENTESIS     left   Current Outpatient Medications on File Prior to Visit  Medication Sig Dispense Refill   Accu-Chek FastClix Lancets MISC USE TO CHECK BLOOD SUGAR TWICE DAILY 102 each 3   ACCU-CHEK GUIDE test strip USE TO CHECK BLOOD SUGAR 2 TIMES DAILY 100 strip 1   acetaminophen (TYLENOL) 325 MG tablet Take 1-2 tablets (325-650 mg total) by mouth every 4 (four) hours as needed for mild pain.     amLODipine (NORVASC) 10 MG tablet TAKE 1 TABLET(10 MG) BY MOUTH DAILY 90 tablet 3   aspirin EC 81 MG tablet Take 81 mg by mouth at bedtime.     Blood Glucose Monitoring Suppl (ACCU-CHEK AVIVA PLUS) w/Device KIT Check BS bid E11.9 1 kit 1   cholecalciferol (VITAMIN  D) 1000 UNITS tablet Take 1,000 Units by mouth at bedtime.      citalopram (CELEXA) 40 MG tablet TAKE 1 TABLET(40 MG) BY MOUTH DAILY 90 tablet 1   Coenzyme Q10 (CO Q 10) 100 MG CAPS Take 100 mg by mouth daily with breakfast.      Erenumab-aooe (AIMOVIG) 140 MG/ML SOAJ Inject 140 mg into the skin every 30 (thirty) days. On or about the 1st of each month     fexofenadine (ALLEGRA) 180 MG tablet Take 180 mg by mouth daily with breakfast.      fluticasone (FLONASE) 50 MCG/ACT nasal spray INSTILL 2 SPRAYS IN THE  NOSE AT BEDTIME 48 g 3   furosemide (LASIX) 40 MG tablet TAKE 1 TABLET BY MOUTH  DAILY 90 tablet 3   JARDIANCE 25 MG TABS tablet TAKE 1 TABLET BY MOUTH DAILY BEFORE BREAKFAST 30 tablet 5   Lancets Misc. (ACCU-CHEK FASTCLIX LANCET) KIT USE AS DIRECTED TO CHECK BLOOD SUGAR TWICE DAILY. DX: E11.9 1 kit 1   Multiple Vitamin (MULTIVITAMIN WITH MINERALS) TABS tablet Take 1 tablet by mouth daily with breakfast.     nitroGLYCERIN (NITROSTAT) 0.4 MG SL tablet PLACE ONE TABLET UNDER THE TONGUE EVERY 5 MINUTES AS NEEDED FOR CHEST PAIN 25 tablet 3   oxyCODONE-acetaminophen (PERCOCET) 10-325 MG tablet Take 1 tablet by mouth every 8 (eight) hours as needed for pain. 21 tablet 0   pantoprazole (PROTONIX) 40 MG tablet TAKE 1 TABLET BY MOUTH  DAILY 90 tablet 3   Polyvinyl Alcohol-Povidone (REFRESH OP) Place 1 drop into both eyes at bedtime as needed (dry eyes).     potassium chloride (MICRO-K) 10 MEQ CR capsule TAKE 1 CAPSULE BY MOUTH  DAILY 90 capsule 3   Rimegepant Sulfate (NURTEC PO) Take by mouth.     rosuvastatin (CRESTOR) 40 MG tablet TAKE 1 TABLET BY MOUTH  DAILY 90 tablet 3   telmisartan (MICARDIS) 40 MG tablet TAKE 1 TABLET BY MOUTH  DAILY 90 tablet 3   No current facility-administered medications on file prior to visit.   Allergies  Allergen Reactions   Ibuprofen Swelling    Whole body swelled   Other Shortness Of Breath    BETA BLOCKERS   Topamax [Topiramate] Other (See Comments)    Pt  states it made his tongue feel "scalded" and he couldn't eat    Toprol Xl [Metoprolol Succinate] Other (See Comments)    Personality change   Metoprolol-Hctz Er Other (See Comments)    Indigestion,mouth raw   Social History   Socioeconomic History   Marital status: Married    Spouse name: Not on file   Number  of children: 2   Years of education: Not on file   Highest education level: Not on file  Occupational History   Occupation: retired  Tobacco Use   Smoking status: Former    Types: Cigarettes    Quit date: 06/01/1987    Years since quitting: 34.0   Smokeless tobacco: Never  Vaping Use   Vaping Use: Never used  Substance and Sexual Activity   Alcohol use: Yes    Alcohol/week: 0.0 standard drinks    Comment: once every 6-7 months   Drug use: No   Sexual activity: Not on file  Other Topics Concern   Not on file  Social History Narrative   Not on file   Social Determinants of Health   Financial Resource Strain: Not on file  Food Insecurity: Not on file  Transportation Needs: Not on file  Physical Activity: Not on file  Stress: Not on file  Social Connections: Not on file  Intimate Partner Violence: Not on file      Review of Systems     Objective:   Physical Exam Vitals reviewed.  Constitutional:      General: He is not in acute distress.    Appearance: He is not ill-appearing, toxic-appearing or diaphoretic.  Cardiovascular:     Rate and Rhythm: Normal rate.     Heart sounds: Normal heart sounds.  Pulmonary:     Effort: Pulmonary effort is normal. No respiratory distress.     Breath sounds: No stridor.  Musculoskeletal:     Right knee: Decreased range of motion. Tenderness present over the medial joint line.     Right lower leg: No edema.     Left lower leg: No edema.  Neurological:     Mental Status: He is alert.          Assessment & Plan:  Type 2 diabetes mellitus with complication, without long-term current use of insulin ([place]) - Plan:  Hemoglobin A1c, CBC with Differential/Platelet, Lipid panel, Microalbumin, urine, COMPLETE METABOLIC PANEL WITH GFR  Acute pain of right knee Patient is long overdue for fasting lab work pertaining to his diabetes since the last time we checked it was in March.  Therefore I have asked him to return fasting for a CMP, and A1c, lipid panel and urine microalbumin.  Goal LDL cholesterol is less than 70.  Goal A1c is less than 6.5.  Regarding the pain in his right knee, I injected the right knee with 2 cc lidocaine, 2 cc of Marcaine, and 2 cc of 40 mg/mL Kenalog using sterile technique.  The patient tolerated the procedure well without complication.

## 2021-06-26 ENCOUNTER — Other Ambulatory Visit: Payer: Medicare Other

## 2021-06-26 ENCOUNTER — Other Ambulatory Visit: Payer: Self-pay

## 2021-06-27 LAB — COMPLETE METABOLIC PANEL WITH GFR
AG Ratio: 1.9 (calc) (ref 1.0–2.5)
ALT: 20 U/L (ref 9–46)
AST: 23 U/L (ref 10–35)
Albumin: 4.5 g/dL (ref 3.6–5.1)
Alkaline phosphatase (APISO): 56 U/L (ref 35–144)
BUN: 16 mg/dL (ref 7–25)
CO2: 29 mmol/L (ref 20–32)
Calcium: 9.8 mg/dL (ref 8.6–10.3)
Chloride: 99 mmol/L (ref 98–110)
Creat: 0.83 mg/dL (ref 0.70–1.28)
Globulin: 2.4 g/dL (calc) (ref 1.9–3.7)
Glucose, Bld: 132 mg/dL — ABNORMAL HIGH (ref 65–99)
Potassium: 4.6 mmol/L (ref 3.5–5.3)
Sodium: 136 mmol/L (ref 135–146)
Total Bilirubin: 0.6 mg/dL (ref 0.2–1.2)
Total Protein: 6.9 g/dL (ref 6.1–8.1)
eGFR: 94 mL/min/{1.73_m2} (ref >=60)

## 2021-06-27 LAB — HEMOGLOBIN A1C
Hgb A1c MFr Bld: 7.4 % of total Hgb — ABNORMAL HIGH (ref <5.7)
Mean Plasma Glucose: 166 mg/dL
eAG (mmol/L): 9.2 mmol/L

## 2021-06-27 LAB — CBC WITH DIFFERENTIAL/PLATELET
Absolute Monocytes: 810 cells/uL (ref 200–950)
Basophils Absolute: 54 cells/uL (ref 0–200)
Basophils Relative: 0.6 %
Eosinophils Absolute: 27 cells/uL (ref 15–500)
Eosinophils Relative: 0.3 %
HCT: 49.3 % (ref 38.5–50.0)
Hemoglobin: 16.2 g/dL (ref 13.2–17.1)
Lymphs Abs: 2754 cells/uL (ref 850–3900)
MCH: 29 pg (ref 27.0–33.0)
MCHC: 32.9 g/dL (ref 32.0–36.0)
MCV: 88.4 fL (ref 80.0–100.0)
MPV: 9.4 fL (ref 7.5–12.5)
Monocytes Relative: 9 %
Neutro Abs: 5355 cells/uL (ref 1500–7800)
Neutrophils Relative %: 59.5 %
Platelets: 253 10*3/uL (ref 140–400)
RBC: 5.58 10*6/uL (ref 4.20–5.80)
RDW: 12.1 % (ref 11.0–15.0)
Total Lymphocyte: 30.6 %
WBC: 9 10*3/uL (ref 3.8–10.8)

## 2021-06-27 LAB — LIPID PANEL
Cholesterol: 146 mg/dL (ref <200)
HDL: 57 mg/dL (ref >=40)
LDL Cholesterol (Calc): 72 mg/dL (calc)
Non-HDL Cholesterol (Calc): 89 mg/dL (calc) (ref <130)
Total CHOL/HDL Ratio: 2.6 (calc) (ref <5.0)
Triglycerides: 91 mg/dL (ref <150)

## 2021-06-27 LAB — MICROALBUMIN, URINE: Microalb, Ur: 0.6 mg/dL

## 2021-06-29 ENCOUNTER — Other Ambulatory Visit: Payer: Self-pay

## 2021-06-29 ENCOUNTER — Other Ambulatory Visit: Payer: Self-pay | Admitting: Cardiovascular Disease

## 2021-06-29 MED ORDER — METFORMIN HCL ER 500 MG PO TB24: 1000.0000 mg | ORAL_TABLET | Freq: Every day | ORAL | 3 refills | Status: DC

## 2021-07-07 ENCOUNTER — Other Ambulatory Visit: Payer: Self-pay | Admitting: Family Medicine

## 2021-07-09 ENCOUNTER — Other Ambulatory Visit: Payer: Self-pay | Admitting: Cardiovascular Disease

## 2021-07-14 ENCOUNTER — Ambulatory Visit (INDEPENDENT_AMBULATORY_CARE_PROVIDER_SITE_OTHER): Payer: Medicare Other | Admitting: Orthopaedic Surgery

## 2021-07-14 ENCOUNTER — Encounter: Payer: Self-pay | Admitting: Orthopaedic Surgery

## 2021-07-14 ENCOUNTER — Other Ambulatory Visit: Payer: Self-pay

## 2021-07-14 DIAGNOSIS — M1811 Unilateral primary osteoarthritis of first carpometacarpal joint, right hand: Secondary | ICD-10-CM

## 2021-07-14 NOTE — Progress Notes (Signed)
Office Visit Note   Patient: Lee [name]           Date of Birth: 1951/01/29           MRN: [id number] Visit Date: 07/14/2021              Requested by: Susy Frizzle, MD [address] [place],  [place] [address] PCP: Susy Frizzle, MD   Assessment & Plan: Visit Diagnoses:  1. Primary osteoarthritis of first carpometacarpal joint of right hand     Plan: Impression is right thumb CMC joint osteoarthritis.  The patient has previously undergone cortisone injections with good, but temporary relief.  He would like to proceed with right thumb CMC arthroplasty at this point.  Risk, benefits and possible complications reviewed.  Rehab recovery time discussed.  All questions were answered.  He is a type II diabetic with a recent hemoglobin A1c of 7.4.  He is also on 81 mg aspirin daily for remote stent placement.  We will likely need to get cardiac and primary care clearance before proceeding.  [name] will call him to schedule.  Follow-Up Instructions: Return for post-op.   Orders:  No orders of the defined types were placed in this encounter.  No orders of the defined types were placed in this encounter.     Procedures: No procedures performed   Clinical Data: No additional findings.   Subjective: Chief Complaint  Patient presents with   Right Wrist - Pain    HPI patient is a pleasant 71 year old gentleman who comes in today with recurrent right thumb pain.  History of underlying degenerative changes the first [facility] joint.  He has had cortisone injections in the past which have provided good but temporary relief.  He is ready to proceed with right thumb CMC arthroplasty.  He underwent left thumb CMC arthroplasty by Dr. Cypher years ago.  Review of Systems as detailed in HPI.  All others reviewed are negative.   Objective: Vital Signs: There were no vitals taken for this visit.  Physical Exam well-developed well-nourished gentleman in no acute distress.   Alert and oriented x3.  Ortho Exam right thumb exam reveals moderate tenderness to the [facility] joint.  He does have pain and crepitus with grind test.  He is neurovascular tact distally.  Specialty Comments:  No specialty comments available.  Imaging: No new imaging   PMFS History: Patient Active Problem List   Diagnosis Date Noted   Pain in right knee 02/12/2020   Second degree Mobitz II AV block 04/30/2019   Syncope and collapse 10/10/2018   Heart block AV complete ([place]) 10/10/2018   Symptomatic bradycardia 10/10/2018   Syncope, vasovagal 09/20/2018   Neck mass 10/14/2016   Depression 05/20/2016   Hypertensive heart disease    Paroxysmal atrial fibrillation ([employer])    Hyperlipidemia LDL goal <70 03/27/2014   Premature ventricular contraction 03/04/2014   Chest pain with high risk of acute coronary syndrome 06/28/2013   Morbid obesity ([place]) 06/28/2013   OSA (obstructive sleep apnea) 12/18/2012   Sleepiness 11/06/2012   Fatigue 11/06/2012   PALPITATIONS 06/02/2009   DYSLIPIDEMIA 09/05/2008   Essential hypertension 09/05/2008   CAD S/P percutaneous coronary angioplasty 09/05/2008   RBBB 09/05/2008   SUPRAVENTRICULAR TACHYCARDIA 09/05/2008   ALLERGIC RHINITIS 09/05/2008   GERD 09/05/2008   BACK PAIN 09/05/2008   ARRHYTHMIA, HX OF 09/05/2008   MIGRAINES, HX OF 09/05/2008   Dyslipidemia 09/05/2008   Past Medical History:  Diagnosis Date  Allergic rhinitis    chronic sinusitis   Arthritis    CAD (coronary artery disease)    a. 2009 PCI/DES to mLAD; b. 05/2010 s/p DES RCA and LAD.; c. 05/2013 Cath: LAD mild ISR, LCX nl, RCA 40p, patent stents.   Cataract    has had lasik surg   COPD (chronic obstructive pulmonary disease) ([facility])    Diabetes mellitus type 2, controlled, without complications ([place])    Diabetes mellitus without complication ([place])    Phreesia 08/22/2020   Diverticulosis    DJD (degenerative joint disease)    Esophagitis 1991   grade 1   GERD  (gastroesophageal reflux disease)    Hiatal hernia   Hemorrhoids    Hyperlipidemia    Hypertensive heart disease    Iron deficiency anemia    Migraines    Paroxysmal atrial fibrillation ([place])    a. s/p afib ablation 10/22/08 (J. Allred).   PVC's (premature ventricular contractions)    Sleep apnea    Questionalble: RDI during the total sleep time 6h 28 mins was 3.55/hr during REM sleep was at 5.71/hr. Supine AHI was 5.93/hr.   Syncope 08/2018    Family History  Problem Relation Age of Onset   Heart disease Father        and sister   Diabetes Father    Colon cancer Mother        mets from uterine   Uterine cancer Mother    Heart disease Sister     Past Surgical History:  Procedure Laterality Date   CARDIAC CATHETERIZATION  05/2010   The proximal LAD was then predilated with 2.0 x 12 trek. this was then stented with a 2.5 x 16 promus Element drug-eluting stent at 14 atmosphere and prostdilated with 2.75 x 12 [place] trek at 16 atmospheres(2.8 mm) resulting the reduction of 80% proximal LAD stenosis to 0% residual with excellent flow.   CARDIAC CATHETERIZATION  06/05/2010   Successful percutaneous coronary intervention to the right coronary artery with percutaneous transluminal coronary angioplasty/stenting and insertion 3.0 x 16 mm Promus DES post dilated to 3.35 mm with stenosis being reduced to 0%   CARDIAC CATHETERIZATION  02/2008   Post dilatation was performed using a 2.75 x 9 [place] sprinter, 10 atmospheres for 40 seconds and then 9 atmosphere for 35 sec. This resulted in the 80% area of narrowing pre-intervention, now appearing to normal. There was no edvidence of the dissection or thrombus and there was TIMI III flow pre and post.   CARDIAC CATHETERIZATION  02/2006   CORONARY ANGIOPLASTY WITH STENT PLACEMENT     3 stents/ 4 caths   EYE SURGERY     KNEE ARTHROSCOPY     right   LASIK     LEFT HEART CATHETERIZATION WITH CORONARY ANGIOGRAM N/A 06/29/2013   Procedure: LEFT HEART  CATHETERIZATION WITH CORONARY ANGIOGRAM;  Surgeon: Leonie Man, MD;  Location: [facility] CATH LAB;  Service: Cardiovascular;  Laterality: N/A;   NASAL SINUS SURGERY  2001   NECK SURGERY     Cervical fusion   PACEMAKER IMPLANT N/A 05/01/2019   Procedure: PACEMAKER IMPLANT;  Surgeon: [name], MD;  Location: [place] CV LAB;  Service: Cardiovascular;  Laterality: N/A;   RADIOFREQUENCY ABLATION  May 2010   atrial fibrillation   ROTATOR CUFF REPAIR     left   SHOULDER OPEN ROTATOR CUFF REPAIR     right   SPINE SURGERY     WRIST ARTHROCENTESIS     left  Social History   Occupational History   Occupation: retired  Tobacco Use   Smoking status: Former    Types: Cigarettes    Quit date: 06/01/1987    Years since quitting: 34.1   Smokeless tobacco: Never  Vaping Use   Vaping Use: Never used  Substance and Sexual Activity   Alcohol use: Yes    Alcohol/week: 0.0 standard drinks    Comment: once every 6-7 months   Drug use: No   Sexual activity: Not on file

## 2021-08-04 ENCOUNTER — Telehealth: Payer: Self-pay

## 2021-08-04 NOTE — Telephone Encounter (Signed)
Pt has been scheduled for a phone call appointment, 08/05/21 @ 2:00.   ? ?Have verified pt's medications with him, everything on file is correct. ? ?Pt gave verbal consent for appointment.  ? ?  ?Patient Consent for Virtual Visit  ? ? ?   ? ?Lee [name] has provided verbal consent on 08/04/2021 for a virtual visit (video or telephone). ? ? ?CONSENT FOR VIRTUAL VISIT FOR:  Lee [name]  ?By participating in this virtual visit I agree to the following: ? ?I hereby voluntarily request, consent and authorize [facility] and its employed or contracted physicians, physician assistants, nurse practitioners or other licensed health care professionals (the Practitioner), to provide me with telemedicine health care services (the ?Services") as deemed necessary by the treating Practitioner. I acknowledge and consent to receive the Services by the Practitioner via telemedicine. I understand that the telemedicine visit will involve communicating with the Practitioner through live audiovisual communication technology and the disclosure of certain medical information by electronic transmission. I acknowledge that I have been given the opportunity to request an in-person assessment or other available alternative prior to the telemedicine visit and am voluntarily participating in the telemedicine visit. ? ?I understand that I have the right to withhold or withdraw my consent to the use of telemedicine in the course of my care at any time, without affecting my right to future care or treatment, and that the Practitioner or I may terminate the telemedicine visit at any time. I understand that I have the right to inspect all information obtained and/or recorded in the course of the telemedicine visit and may receive copies of available information for a reasonable fee.  I understand that some of the potential risks of receiving the Services via telemedicine include:  ?Delay or interruption in medical evaluation due to  technological equipment failure or disruption; ?Information transmitted may not be sufficient (e.g. poor resolution of images) to allow for appropriate medical decision making by the Practitioner; and/or  ?In rare instances, security protocols could fail, causing a breach of personal health information. ? ?Furthermore, I acknowledge that it is my responsibility to provide information about my medical history, conditions and care that is complete and accurate to the best of my ability. I acknowledge that Practitioner's advice, recommendations, and/or decision may be based on factors not within their control, such as incomplete or inaccurate data provided by me or distortions of diagnostic images or specimens that may result from electronic transmissions. I understand that the practice of medicine is not an exact science and that Practitioner makes no warranties or guarantees regarding treatment outcomes. I acknowledge that a copy of this consent can be made available to me via my patient portal ([facility]), or I can request a printed copy by calling the office of [facility].   ? ?I understand that my insurance will be billed for this visit.  ? ?I have read or had this consent read to me. ?I understand the contents of this consent, which adequately explains the benefits and risks of the Services being provided via telemedicine.  ?I have been provided ample opportunity to ask questions regarding this consent and the Services and have had my questions answered to my satisfaction. ?I give my informed consent for the services to be provided through the use of telemedicine in my medical care ? ? ? ?

## 2021-08-04 NOTE — Telephone Encounter (Signed)
? ?  Pre-operative Risk Assessment  ?  ?Patient Name: Lee [name]  ?DOB: November 15, 1950 ?MRN: [id number]  ? ?  ? ?Request for Surgical Clearance   ? ?Procedure:   RIGHT THUMB LIGAMENT RECONSTRUCTION & TENDON INTERPOSITION ? ?Date of Surgery:  Clearance TBD                              ?   ?Surgeon:  Marianna Payment ?Surgeon's Group or Practice Name:  [facility] ?Phone number:  [phone] ?Fax number:  [phone] ?  ?Type of Clearance Requested:   ?- Medical  ?  ?Type of Anesthesia:   AXILLARY -or- SUPRACLAVICULAR. ?  ?Additional requests/questions:   ? ? ? ?

## 2021-08-04 NOTE — Telephone Encounter (Signed)
Preoperative team, please contact this patient and set up a phone call appointment for further cardiac evaluation.  Thank you for your help. ? ?[name]. [name] NP-C ? ?  ?08/04/2021, 3:41 PM ?[name] [name] ?Office [phone] Fax [phone] ? ?

## 2021-08-05 ENCOUNTER — Ambulatory Visit (INDEPENDENT_AMBULATORY_CARE_PROVIDER_SITE_OTHER): Payer: Medicare Other | Admitting: Physician Assistant

## 2021-08-05 DIAGNOSIS — Z01810 Encounter for preprocedural cardiovascular examination: Secondary | ICD-10-CM | POA: Diagnosis not present

## 2021-08-05 DIAGNOSIS — I2510 Atherosclerotic heart disease of native coronary artery without angina pectoris: Secondary | ICD-10-CM | POA: Diagnosis not present

## 2021-08-05 NOTE — Progress Notes (Signed)
Virtual Visit via Telephone Note   This visit type was conducted due to national recommendations for restrictions regarding the COVID-19 Pandemic (e.g. social distancing) in an effort to limit this patient's exposure and mitigate transmission in our community.  Due to his co-morbid illnesses, this patient is at least at moderate risk for complications without adequate follow up.  This format is felt to be most appropriate for this patient at this time.  The patient did not have access to video technology/had technical difficulties with video requiring transitioning to audio format only (telephone).  All issues noted in this document were discussed and addressed.  No physical exam could be performed with this format.  Please refer to the patient's chart for his  consent to telehealth for Lee Lee [name]. Evaluation Performed:  Preoperative cardiovascular risk assessment  This visit type was conducted due to national recommendations for restrictions regarding the COVID-19 Pandemic (e.g. social distancing).  This format is felt to be most appropriate for this patient at this time.  All issues noted in this document were discussed and addressed.  No physical exam was performed (except for noted visual exam findings with Video Visits).  Please refer to the patient's chart (MyChart message for video visits and phone note for telephone visits) for the patient's consent to telehealth for Lee Lee [name]. _____________   Date:  08/05/2021   Patient ID:  Lee Lee [name], DOB May 17, 1951, MRN [id number] Patient Location:  Home Provider location:   Office  Primary Care Provider:  Susy Frizzle, [name] Primary Cardiologist:  Lee Lee [name]  Chief Complaint    71 y.o. y/o male with a h/o CAD, DM, HTN, HLD, IDA, COPD, S/p PPM for Mobitz 2 and sleep apnea  who is pending RIGHT THUMB [place], and presents today for telephonic preoperative cardiovascular risk  assessment.  Past Medical History    Past Medical History:  Diagnosis Date   Allergic rhinitis    chronic sinusitis   Arthritis    CAD (coronary artery disease)    a. 2009 PCI/DES to mLAD; b. 05/2010 s/p DES RCA and LAD.; c. 05/2013 Cath: LAD mild ISR, LCX nl, RCA 40p, patent stents.   Cataract    has had lasik surg   COPD (chronic obstructive pulmonary disease) (HCC)    Diabetes mellitus type 2, controlled, without complications (Lee [name])    Diabetes mellitus without complication (Lee [name])    Phreesia 08/22/2020   Diverticulosis    DJD (degenerative joint disease)    Esophagitis 1991   grade 1   GERD (gastroesophageal reflux disease)    Hiatal hernia   Hemorrhoids    Hyperlipidemia    Hypertensive heart disease    Iron deficiency anemia    Migraines    Paroxysmal atrial fibrillation (Lee [name])    a. s/p afib ablation 10/22/08 (Lee Lee [name]).   PVC's (premature ventricular contractions)    Sleep apnea    Questionalble: RDI during the total sleep time 6h 28 mins was 3.55/hr during REM sleep was at 5.71/hr. Supine AHI was 5.93/hr.   Syncope 08/2018   Past Surgical History:  Procedure Laterality Date   CARDIAC CATHETERIZATION  05/2010   The proximal LAD was then predilated with 2.0 x 12 trek. this was then stented with a 2.5 x 16 promus Element drug-eluting stent at 14 atmosphere and prostdilated with 2.75 x 12 [place] trek at 16 atmospheres(2.8 mm) resulting the reduction of 80% proximal LAD stenosis to 0% residual with excellent flow.  CARDIAC CATHETERIZATION  06/05/2010   Successful percutaneous coronary intervention to the right coronary artery with percutaneous transluminal coronary angioplasty/stenting and insertion 3.0 x 16 mm Promus DES post dilated to 3.35 mm with stenosis being reduced to 0%   CARDIAC CATHETERIZATION  02/2008   Post dilatation was performed using a 2.75 x 9 [place] sprinter, 10 atmospheres for 40 seconds and then 9 atmosphere for 35 sec. This resulted in the 80% area of  narrowing pre-intervention, now appearing to normal. There was no edvidence of the dissection or thrombus and there was TIMI III flow pre and post.   CARDIAC CATHETERIZATION  02/2006   CORONARY ANGIOPLASTY WITH STENT PLACEMENT     3 stents/ 4 caths   Lee SURGERY     KNEE ARTHROSCOPY     right   LASIK     LEFT HEART CATHETERIZATION WITH CORONARY ANGIOGRAM N/A 06/29/2013   Procedure: LEFT HEART CATHETERIZATION WITH CORONARY ANGIOGRAM;  Surgeon: Leonie Man, [name];  Location: [name];  Service: Cardiovascular;  Laterality: N/A;   NASAL SINUS SURGERY  2001   NECK SURGERY     Cervical fusion   PACEMAKER IMPLANT N/A 05/01/2019   Procedure: PACEMAKER IMPLANT;  Surgeon: Lee Lee [name];  Location: Lee [name];  Service: Cardiovascular;  Laterality: N/A;   RADIOFREQUENCY ABLATION  May 2010   atrial fibrillation   ROTATOR CUFF REPAIR     left   SHOULDER OPEN ROTATOR CUFF REPAIR     right   SPINE SURGERY     WRIST ARTHROCENTESIS     left    Allergies  Allergies  Allergen Reactions   Ibuprofen Swelling    Whole body swelled   Other Shortness Of Breath    BETA BLOCKERS   Topamax [Topiramate] Other (See Comments)    Pt states it made his tongue feel "scalded" and he couldn't eat    Toprol Xl [Metoprolol Succinate] Other (See Comments)    Personality change   Metoprolol-Hctz Er Other (See Comments)    Indigestion,mouth raw    History of Present Illness    Lee Lee [name] is a 71 y.o. male who presents via audio/video conferencing for a telehealth visit today.  Pt was last seen in cardiology clinic on 10/21/20, by Lee Lee [name].  At that time Lee Lee [name] was doing well.  he is now pending [facility].  Since his last visit, he is doing well. Exercise 3-4 days/week. The patient denies nausea, vomiting, fever, chest pain, palpitations, shortness of breath, orthopnea, PND, dizziness, syncope, cough, congestion, abdominal pain,  hematochezia, melena, lower extremity edema. Gets > 4 mets of activity.    Home Medications    Prior to Admission medications   Medication Sig Start Date End Date Taking? Authorizing Provider  Accu-Chek FastClix Lancets MISC USE TO CHECK BLOOD SUGAR TWICE DAILY 07/07/21   Lee Lee [name]  ACCU-CHEK GUIDE test strip USE TO CHECK BLOOD SUGAR 2 TIMES DAILY 09/11/20   Lee Lee [name]  acetaminophen (TYLENOL) 325 MG tablet Take 1-2 tablets (325-650 mg total) by mouth every 4 (four) hours as needed for mild pain. 05/02/19   Shirley Friar, PA-C  amLODipine (NORVASC) 10 MG tablet TAKE 1 TABLET(10 MG) BY MOUTH DAILY 07/23/20   Lee Lee [name]  aspirin EC 81 MG tablet Take 81 mg by mouth at bedtime.    Provider, Historical, [name]  Blood Glucose Monitoring Suppl (ACCU-CHEK AVIVA PLUS) w/Device KIT Check  BS bid E11.9 05/10/19   Lee Lee [name]  cholecalciferol (VITAMIN D) 1000 UNITS tablet Take 1,000 Units by mouth at bedtime.     Provider, Historical, [name]  citalopram (CELEXA) 40 MG tablet TAKE 1 TABLET(40 MG) BY MOUTH DAILY 04/20/21   Lee Lee [name]  Coenzyme Q10 (CO Q 10) 100 MG CAPS Take 100 mg by mouth daily with breakfast.     Provider, Historical, [name]  Erenumab-aooe (AIMOVIG) 140 MG/ML SOAJ Inject 140 mg into the skin every 30 (thirty) days. On or about the 1st of each month    Provider, Historical, [name]  fexofenadine (ALLEGRA) 180 MG tablet Take 180 mg by mouth daily with breakfast.     Provider, Historical, [name]  fluticasone (FLONASE) 50 MCG/ACT nasal spray INSTILL 2 SPRAYS IN THE  NOSE AT BEDTIME 04/06/17   Lee Lee [name]  furosemide (LASIX) 40 MG tablet TAKE 1 TABLET BY MOUTH  DAILY 11/17/20   Troy Sine, [name]  JARDIANCE 25 MG TABS tablet TAKE 1 TABLET BY MOUTH DAILY BEFORE BREAKFAST 04/20/21   Lee Lee [name]  Lancets Misc. (ACCU-CHEK FASTCLIX LANCET) KIT USE AS DIRECTED TO CHECK BLOOD SUGAR TWICE DAILY. DX: E11.9 11/12/20   Lee Lee [name]   metFORMIN (GLUCOPHAGE XR) 500 MG 24 hr tablet Take 2 tablets (1,000 mg total) by mouth daily with breakfast. 06/29/21   Lee Lee [name]  Multiple Vitamin (MULTIVITAMIN WITH MINERALS) TABS tablet Take 1 tablet by mouth daily with breakfast.    Provider, Historical, [name]  nitroGLYCERIN (NITROSTAT) 0.4 MG SL tablet PLACE ONE TABLET UNDER THE TONGUE EVERY 5 MINUTES AS NEEDED FOR CHEST PAIN 02/13/19   Troy Sine, [name]  oxyCODONE-acetaminophen (PERCOCET) 10-325 MG tablet Take 1 tablet by mouth every 8 (eight) hours as needed for pain. 05/05/21   Lee Lee [name]  pantoprazole (PROTONIX) 40 MG tablet TAKE 1 TABLET BY MOUTH  DAILY 06/30/21   Troy Sine, [name]  Polyvinyl Alcohol-Povidone (REFRESH OP) Place 1 drop into both eyes at bedtime as needed (dry eyes).    Provider, Historical, [name]  potassium chloride (MICRO-K) 10 MEQ CR capsule TAKE 1 CAPSULE BY MOUTH  DAILY 07/10/21   Troy Sine, [name]  Rimegepant Sulfate (NURTEC PO) Take by mouth.    Provider, Historical, [name]  rosuvastatin (CRESTOR) 40 MG tablet TAKE 1 TABLET BY MOUTH  DAILY 07/10/21   Troy Sine, [name]  telmisartan (MICARDIS) 40 MG tablet TAKE 1 TABLET BY MOUTH  DAILY 01/20/21   Troy Sine, [name]    Physical Exam    Vital Signs:  Lytle Michaels does not have vital signs available for review today.  Given telephonic nature of communication, physical exam is limited. AAOx3. NAD. Normal affect.  Speech and respirations are unlabored.  Accessory Clinical Findings    None  Assessment & Plan    1.  Preoperative Cardiovascular Risk Assessment:  Given past medical history and time since last visit, based on ACC/AHA guidelines, MIGUEL CHRISTIANA would be at acceptable risk for the planned procedure without further cardiovascular testing.   The patient was advised that if he develops new symptoms prior to surgery to contact our office to arrange for a follow-up visit, and he verbalized understanding.  I will route this  recommendation to the requesting party via Epic fax function and remove from pre-op pool.  Please call with questions.  COVID-19 Education: The signs and symptoms of COVID-19 were discussed with the patient  and how to seek care for testing (follow up with PCP or arrange E-visit).  The importance of social distancing was discussed today.  Patient Risk:   After full review of this patient's history and clinical status, I feel that he is at least moderate risk for cardiac complications at this time, thus necessitating a telehealth visit sooner than our first available in office visit.  Time:   Today, I have spent 6 minutes with the patient with telehealth technology discussing medical history, symptoms, and management plan.     [place], [place]  08/05/2021, 1:55 PM

## 2021-08-13 ENCOUNTER — Other Ambulatory Visit: Payer: Self-pay

## 2021-08-13 ENCOUNTER — Encounter: Payer: Self-pay | Admitting: Podiatry

## 2021-08-13 ENCOUNTER — Ambulatory Visit (INDEPENDENT_AMBULATORY_CARE_PROVIDER_SITE_OTHER): Payer: Medicare Other | Admitting: Podiatry

## 2021-08-13 DIAGNOSIS — D2372 Other benign neoplasm of skin of left lower limb, including hip: Secondary | ICD-10-CM | POA: Diagnosis not present

## 2021-08-13 DIAGNOSIS — D2371 Other benign neoplasm of skin of right lower limb, including hip: Secondary | ICD-10-CM

## 2021-08-13 DIAGNOSIS — M7751 Other enthesopathy of right foot: Secondary | ICD-10-CM

## 2021-08-13 MED ORDER — DEXAMETHASONE SODIUM PHOSPHATE 120 MG/30ML IJ SOLN
2.0000 mg | Freq: Once | INTRAMUSCULAR | Status: AC
  Administered 2021-08-13: 2 mg via INTRA_ARTICULAR

## 2021-08-13 NOTE — Progress Notes (Signed)
Lee [name] presents today chief complaint of painful corn keratomas or benign skin lesions to the plantar aspect of his bilateral foot beneath the fifth metatarsal heads.  He is also complaining of pain today with warmth beneath his fifth metatarsal base of his right foot. ? ?Objective: Vital signs are stable he is alert and oriented x3 pulses are palpable.  He has multiple benign skin lesions plantar aspect of the bilateral foot worse beneath the subfifth metatarsal head of his right foot.  He also has a painful area on palpation with fluctuance in the fifth metatarsal base of the right foot.  No open lesions or wounds are noted. ? ?Assessment: Pain in limb secondary to benign skin lesions.  And bursitis fifth metatarsal base right foot. ? ?Plan: I injected this today 2 mg of dexamethasone local anesthetic and debrided all benign skin lesions for him.  He question as to whether or not we needed to [facility].  I explained to him that I had a nucleated all of the lesions and that he should be in good shape until his next visit. ?

## 2021-08-25 ENCOUNTER — Other Ambulatory Visit: Payer: Self-pay

## 2021-08-25 ENCOUNTER — Encounter (HOSPITAL_BASED_OUTPATIENT_CLINIC_OR_DEPARTMENT_OTHER)
Admission: RE | Admit: 2021-08-25 | Discharge: 2021-08-25 | Disposition: A | Discharge status: Home / Self Care | Payer: Medicare Other | Source: Ambulatory Visit | Attending: Orthopaedic Surgery | Admitting: Orthopaedic Surgery

## 2021-08-25 ENCOUNTER — Encounter (HOSPITAL_BASED_OUTPATIENT_CLINIC_OR_DEPARTMENT_OTHER): Payer: Self-pay | Admitting: Orthopaedic Surgery

## 2021-08-25 DIAGNOSIS — I10 Essential (primary) hypertension: Secondary | ICD-10-CM | POA: Insufficient documentation

## 2021-08-25 DIAGNOSIS — Z01812 Encounter for preprocedural laboratory examination: Secondary | ICD-10-CM | POA: Insufficient documentation

## 2021-08-25 LAB — BASIC METABOLIC PANEL
Anion gap: 7 (ref 5–15)
BUN: 15 mg/dL (ref 8–23)
CO2: 28 mmol/L (ref 22–32)
Calcium: 9.5 mg/dL (ref 8.9–10.3)
Chloride: 100 mmol/L (ref 98–111)
Creatinine, Ser: 0.82 mg/dL (ref 0.61–1.24)
GFR, Estimated: >60 mL/min (ref >60)
Glucose, Bld: 89 mg/dL (ref 70–99)
Potassium: 4.6 mmol/L (ref 3.5–5.1)
Sodium: 135 mmol/L (ref 135–145)

## 2021-08-25 NOTE — Progress Notes (Signed)
Voicemail left for Debbie at Dr. Phoebe Sharps office regarding when patient should stop his aspirin.  ?

## 2021-08-25 NOTE — Progress Notes (Signed)

## 2021-08-26 ENCOUNTER — Telehealth: Payer: Self-pay | Admitting: Cardiovascular Disease

## 2021-08-26 NOTE — Telephone Encounter (Signed)
? ? ?  Patient Name: Lee [name]  ?DOB: 20-Apr-1951 ?MRN: [id number] ? ?Primary Cardiologist: Shelva Majestic, MD ? ?Chart reviewed as part of pre-operative protocol coverage. Given past medical history and time since last visit, based on ACC/AHA guidelines, JABRI BLANCETT would be at acceptable risk for the planned procedure without further cardiovascular testing.  ? ?Patient may hold aspirin for 5 to 7 days prior to the procedure and restart as soon as possible afterward at the surgeon's discretion. ? ?The patient was advised that if he develops new symptoms prior to surgery to contact our office to arrange for a follow-up visit, and he verbalized understanding. ? ?I will route this recommendation to the requesting party via Epic fax function and remove from pre-op pool. ? ?Please call with questions. ? ?Almyra Deforest, [place] ?08/26/2021, 6:49 PM ? ?

## 2021-08-26 NOTE — Telephone Encounter (Signed)
?  ?  ? ?  Pre-operative Risk Assessment  ?  ?Patient Name: Lee [name]  ?DOB: [birth year] ?MRN: [id number]  ? ?  ? ?Request for Surgical Clearance   ? ?Procedure:  R Thumb Ligament Reconstruction and Tendon Interposition  ? ?Date of Surgery:  Clearance 09/02/21                              ?   ?Surgeon:  Dr. Eduard Roux ?Surgeon's Group or Practice Name:  [facility]  ?Phone number:  [phone] ?Fax number:  [phone] ?  ?Type of Clearance Requested:   ?- Pharmacy:  Hold Aspirin TBD by Cardiology ?  ?Type of Anesthesia:  Axillary or Suprackavicular ?  ?Additional requests/questions:   ?Previous surgical clearance did not request pharmacy clearance.[facility] needs to know how long the patient needs to hold his Aspirin  ? ?Signed, ?Johnna Acosta   ?08/26/2021, 3:00 PM   ?

## 2021-08-27 ENCOUNTER — Ambulatory Visit (INDEPENDENT_AMBULATORY_CARE_PROVIDER_SITE_OTHER): Payer: Medicare Other

## 2021-08-27 VITALS — Ht 67.5 in | Wt 227.6 lb

## 2021-08-27 DIAGNOSIS — Z0000 Encounter for general adult medical examination without abnormal findings: Secondary | ICD-10-CM | POA: Diagnosis not present

## 2021-08-27 NOTE — Patient Instructions (Signed)
Lee [name] , ?Thank you for taking time to come for your Medicare Wellness Visit. I appreciate your ongoing commitment to your health goals. Please review the following plan we discussed and let me know if I can assist you in the future.  ? ?Screening recommendations/referrals: ?Colonoscopy: Done 06/20/2022 Repeat in 10 years ? ?Recommended yearly ophthalmology/optometry visit for glaucoma screening and checkup ?Recommended yearly dental visit for hygiene and checkup ? ?Vaccinations: ?Influenza vaccine: Done Repeat annually ? ?Pneumococcal vaccine: Done 05/26/2015 and 09/17/2016 ?Tdap vaccine: Done 12/07/2018 Repeat in 10 years ? ?Shingles vaccine: Done 03/30/2012. Discussed Shingrix.   ?Covid-19: Done 07/13/2019, 08/10/2019  ? ?Advanced directives: Please bring a copy of your health care power of attorney and living will to the office to be added to your chart at your convenience. ? ? ?Conditions/risks identified: KEEP UP THE GOOD WORK!! ? ?Next appointment: Follow up in one year for your annual wellness visit. 2024. ? ?Preventive Care 72 Years and Older, Male ? ?Preventive care refers to lifestyle choices and visits with your health care provider that can promote health and wellness. ?What does preventive care include? ?A yearly physical exam. This is also called an annual well check. ?Dental exams once or twice a year. ?Routine eye exams. Ask your health care provider how often you should have your eyes checked. ?Personal lifestyle choices, including: ?Daily care of your teeth and gums. ?Regular physical activity. ?Eating a healthy diet. ?Avoiding tobacco and drug use. ?Limiting alcohol use. ?Practicing safe sex. ?Taking low doses of aspirin every day. ?Taking vitamin and mineral supplements as recommended by your health care provider. ?What happens during an annual well check? ?The services and screenings done by your health care provider during your annual well check will depend on your age, overall health,  lifestyle risk factors, and family history of disease. ?Counseling  ?Your health care provider may ask you questions about your: ?Alcohol use. ?Tobacco use. ?Drug use. ?Emotional well-being. ?Home and relationship well-being. ?Sexual activity. ?Eating habits. ?History of falls. ?Memory and ability to understand (cognition). ?Work and work Statistician. ?Screening  ?You may have the following tests or measurements: ?Height, weight, and BMI. ?Blood pressure. ?Lipid and cholesterol levels. These may be checked every 5 years, or more frequently if you are over 64 years old. ?Skin check. ?Lung cancer screening. You may have this screening every year starting at age 66 if you have a 30-pack-year history of smoking and currently smoke or have quit within the past 15 years. ?Fecal occult blood test (FOBT) of the stool. You may have this test every year starting at age 62. ?Flexible sigmoidoscopy or colonoscopy. You may have a sigmoidoscopy every 5 years or a colonoscopy every 10 years starting at age 48. ?Prostate cancer screening. Recommendations will vary depending on your family history and other risks. ?Hepatitis C blood test. ?Hepatitis B blood test. ?Sexually transmitted disease (STD) testing. ?Diabetes screening. This is done by checking your blood sugar (glucose) after you have not eaten for a while (fasting). You may have this done every 1-3 years. ?Abdominal aortic aneurysm (AAA) screening. You may need this if you are a current or former smoker. ?Osteoporosis. You may be screened starting at age 55 if you are at high risk. ?Talk with your health care provider about your test results, treatment options, and if necessary, the need for more tests. ?Vaccines  ?Your health care provider may recommend certain vaccines, such as: ?Influenza vaccine. This is recommended every year. ?Tetanus, diphtheria, and acellular pertussis (Tdap,  Td) vaccine. You may need a Td booster every 10 years. ?Zoster vaccine. You may need this  after age 45. ?Pneumococcal 13-valent conjugate (PCV13) vaccine. One dose is recommended after age 55. ?Pneumococcal polysaccharide (PPSV23) vaccine. One dose is recommended after age 69. ?Talk to your health care provider about which screenings and vaccines you need and how often you need them. ?This information is not intended to replace advice given to you by your health care provider. Make sure you discuss any questions you have with your health care provider. ?Document Released: 06/13/2015 Document Revised: 02/04/2016 Document Reviewed: 03/18/2015 ?Elsevier Interactive Patient Education ? 2017 [employer]. ? ?Fall Prevention in the Home ?Falls can cause injuries. They can happen to people of all ages. There are many things you can do to make your home safe and to help prevent falls. ?What can I do on the outside of my home? ?Regularly fix the edges of walkways and driveways and fix any cracks. ?Remove anything that might make you trip as you walk through a door, such as a raised step or threshold. ?Trim any bushes or trees on the path to your home. ?Use bright outdoor lighting. ?Clear any walking paths of anything that might make someone trip, such as rocks or tools. ?Regularly check to see if handrails are loose or broken. Make sure that both sides of any steps have handrails. ?Any raised decks and porches should have guardrails on the edges. ?Have any leaves, snow, or ice cleared regularly. ?Use sand or salt on walking paths during winter. ?Clean up any spills in your garage right away. This includes oil or grease spills. ?What can I do in the bathroom? ?Use night lights. ?Install grab bars by the toilet and in the tub and shower. Do not use towel bars as grab bars. ?Use non-skid mats or decals in the tub or shower. ?If you need to sit down in the shower, use a plastic, non-slip stool. ?Keep the floor dry. Clean up any water that spills on the floor as soon as it happens. ?Remove soap buildup in the tub or  shower regularly. ?Attach bath mats securely with double-sided non-slip rug tape. ?Do not have throw rugs and other things on the floor that can make you trip. ?What can I do in the bedroom? ?Use night lights. ?Make sure that you have a light by your bed that is easy to reach. ?Do not use any sheets or blankets that are too big for your bed. They should not hang down onto the floor. ?Have a firm chair that has side arms. You can use this for support while you get dressed. ?Do not have throw rugs and other things on the floor that can make you trip. ?What can I do in the kitchen? ?Clean up any spills right away. ?Avoid walking on wet floors. ?Keep items that you use a lot in easy-to-reach places. ?If you need to reach something above you, use a strong step stool that has a grab bar. ?Keep electrical cords out of the way. ?Do not use floor polish or wax that makes floors slippery. If you must use wax, use non-skid floor wax. ?Do not have throw rugs and other things on the floor that can make you trip. ?What can I do with my stairs? ?Do not leave any items on the stairs. ?Make sure that there are handrails on both sides of the stairs and use them. Fix handrails that are broken or loose. Make sure that handrails are as  long as the stairways. ?Check any carpeting to make sure that it is firmly attached to the stairs. Fix any carpet that is loose or worn. ?Avoid having throw rugs at the top or bottom of the stairs. If you do have throw rugs, attach them to the floor with carpet tape. ?Make sure that you have a light switch at the top of the stairs and the bottom of the stairs. If you do not have them, ask someone to add them for you. ?What else can I do to help prevent falls? ?Wear shoes that: ?Do not have high heels. ?Have rubber bottoms. ?Are comfortable and fit you well. ?Are closed at the toe. Do not wear sandals. ?If you use a stepladder: ?Make sure that it is fully opened. Do not climb a closed stepladder. ?Make  sure that both sides of the stepladder are locked into place. ?Ask someone to hold it for you, if possible. ?Clearly mark and make sure that you can see: ?Any grab bars or handrails. ?First and last steps. ?Whe

## 2021-08-27 NOTE — Progress Notes (Signed)
? ?Subjective:  ? Lee [name] is a 71 y.o. male who presents for Medicare Annual/Subsequent preventive examination. ? ?Review of Systems    ? ?Cardiac Risk Factors include: advanced age (>13mn, >>16women);diabetes mellitus;dyslipidemia;hypertension;male gender;sedentary lifestyle;obesity (BMI >30kg/m2) ? ?   ?Objective:  ?  ?Today's Vitals  ? 08/27/21 0807  ?Weight: 227 lb 9.6 oz (103.2 kg)  ?Height: 5' 7.5" (1.715 m)  ? ?Body mass index is 35.12 kg/m?. ? ? ?  08/27/2021  ?  8:28 AM 08/25/2021  ?  1:54 PM 08/25/2020  ? 10:55 AM 05/03/2020  ?  5:33 PM 04/30/2019  ?  1:52 PM 10/10/2018  ?  9:00 PM 10/10/2018  ?  2:14 PM  ?Advanced Directives  ?Does Patient Have a Medical Advance Directive? Yes Yes Yes No No Yes Yes  ?Type of Advance Directive Living will     Living will Living will  ?Does patient want to make changes to medical advance directive?   No - Patient declined   No - Patient declined   ? ? ?Current Medications (verified) ?Outpatient Encounter Medications as of 08/27/2021  ?Medication Sig  ? Accu-Chek FastClix Lancets MISC USE TO CHECK BLOOD SUGAR TWICE DAILY  ? ACCU-CHEK GUIDE test strip USE TO CHECK BLOOD SUGAR 2 TIMES DAILY  ? acetaminophen (TYLENOL) 325 MG tablet Take 1-2 tablets (325-650 mg total) by mouth every 4 (four) hours as needed for mild pain.  ? amLODipine (NORVASC) 10 MG tablet TAKE 1 TABLET(10 MG) BY MOUTH DAILY  ? aspirin EC 81 MG tablet Take 81 mg by mouth at bedtime.  ? Blood Glucose Monitoring Suppl (ACCU-CHEK AVIVA PLUS) w/Device KIT Check BS bid E11.9  ? cholecalciferol (VITAMIN D) 1000 UNITS tablet Take 1,000 Units by mouth at bedtime.   ? citalopram (CELEXA) 40 MG tablet TAKE 1 TABLET(40 MG) BY MOUTH DAILY  ? Coenzyme Q10 (CO Q 10) 100 MG CAPS Take 100 mg by mouth daily with breakfast.   ? Erenumab-aooe (AIMOVIG) 140 MG/ML SOAJ Inject 140 mg into the skin every 30 (thirty) days. On or about the 1st of each month  ? fexofenadine (ALLEGRA) 180 MG tablet Take 180 mg by mouth daily with  breakfast.   ? fluticasone (FLONASE) 50 MCG/ACT nasal spray INSTILL 2 SPRAYS IN THE  NOSE AT BEDTIME  ? furosemide (LASIX) 40 MG tablet TAKE 1 TABLET BY MOUTH  DAILY  ? JARDIANCE 25 MG TABS tablet TAKE 1 TABLET BY MOUTH DAILY BEFORE BREAKFAST  ? Lancets Misc. (ACCU-CHEK FASTCLIX LANCET) KIT USE AS DIRECTED TO CHECK BLOOD SUGAR TWICE DAILY. DX: E11.9  ? metFORMIN (GLUCOPHAGE-XR) 500 MG 24 hr tablet Take by mouth.  ? Multiple Vitamin (MULTIVITAMIN WITH MINERALS) TABS tablet Take 1 tablet by mouth daily with breakfast.  ? nitroGLYCERIN (NITROSTAT) 0.4 MG SL tablet PLACE ONE TABLET UNDER THE TONGUE EVERY 5 MINUTES AS NEEDED FOR CHEST PAIN  ? oxyCODONE-acetaminophen (PERCOCET) 10-325 MG tablet Take 1 tablet by mouth every 8 (eight) hours as needed for pain.  ? pantoprazole (PROTONIX) 40 MG tablet TAKE 1 TABLET BY MOUTH  DAILY  ? Polyvinyl Alcohol-Povidone (REFRESH OP) Place 1 drop into both eyes at bedtime as needed (dry eyes).  ? potassium chloride (MICRO-K) 10 MEQ CR capsule TAKE 1 CAPSULE BY MOUTH  DAILY  ? Rimegepant Sulfate (NURTEC PO) Take by mouth.  ? rosuvastatin (CRESTOR) 40 MG tablet TAKE 1 TABLET BY MOUTH  DAILY  ? Study - CAPTIVA - aspirin 81 mg tablet (PI-Sethi) Chew by mouth.  ?  telmisartan (MICARDIS) 40 MG tablet TAKE 1 TABLET BY MOUTH  DAILY  ? amLODipine (NORVASC) 10 MG tablet Take by mouth. (Patient not taking: Reported on 08/27/2021)  ? [DISCONTINUED] metFORMIN (GLUCOPHAGE XR) 500 MG 24 hr tablet Take 2 tablets (1,000 mg total) by mouth daily with breakfast.  ? ?No facility-administered encounter medications on file as of 08/27/2021.  ? ? ?Allergies (verified) ?Ibuprofen, Other, Topamax [topiramate], Toprol xl [metoprolol succinate], and Metoprolol-hctz er  ? ?History: ?Past Medical History:  ?Diagnosis Date  ? Allergic rhinitis   ? chronic sinusitis  ? Allergy   ? Arthritis   ? CAD (coronary artery disease)   ? a. 2009 PCI/DES to mLAD; b. 05/2010 s/p DES RCA and LAD.; c. 05/2013 Cath: LAD mild ISR, LCX  nl, RCA 40p, patent stents.  ? Cataract   ? has had lasik surg  ? COPD (chronic obstructive pulmonary disease) ([place])   ? Depression   ? Diabetes mellitus type 2, controlled, without complications ([place])   ? Diabetes mellitus without complication ([place])   ? Phreesia 08/22/2020  ? Diverticulosis   ? DJD (degenerative joint disease)   ? Esophagitis 1991  ? grade 1  ? GERD (gastroesophageal reflux disease)   ? Hiatal hernia  ? Hemorrhoids   ? Hyperlipidemia   ? Hypertension   ? Hypertensive heart disease   ? Iron deficiency anemia   ? Migraines   ? Myocardial infarction [facility])   ? Paroxysmal atrial fibrillation ([facility])   ? a. s/p afib ablation 10/22/08 ([facility]).  ? PVC's (premature ventricular contractions)   ? Sleep apnea   ? Questionalble: RDI during the total sleep time 6h 28 mins was 3.55/hr during REM sleep was at 5.71/hr. Supine AHI was 5.93/hr.  ? Syncope 08/2018  ? ?Past Surgical History:  ?Procedure Laterality Date  ? ABDOMINAL HYSTERECTOMY    ? CARDIAC CATHETERIZATION  05/31/2010  ? The proximal LAD was then predilated with 2.0 x 12 trek. this was then stented with a 2.5 x 16 promus Element drug-eluting stent at 14 atmosphere and prostdilated with 2.75 x 12 [place] trek at 16 atmospheres(2.8 mm) resulting the reduction of 80% proximal LAD stenosis to 0% residual with excellent flow.  ? CARDIAC CATHETERIZATION  06/05/2010  ? Successful percutaneous coronary intervention to the right coronary artery with percutaneous transluminal coronary angioplasty/stenting and insertion 3.0 x 16 mm Promus DES post dilated to 3.35 mm with stenosis being reduced to 0%  ? CARDIAC CATHETERIZATION  02/29/2008  ? Post dilatation was performed using a 2.75 x 9 [place] sprinter, 10 atmospheres for 40 seconds and then 9 atmosphere for 35 sec. This resulted in the 80% area of narrowing pre-intervention, now appearing to normal. There was no edvidence of the dissection or thrombus and there was TIMI III flow pre and post.  ? CARDIAC CATHETERIZATION   02/28/2006  ? CORONARY ANGIOPLASTY WITH STENT PLACEMENT    ? 3 stents/ 4 caths  ? EYE SURGERY    ? KNEE ARTHROSCOPY    ? right  ? LASIK    ? LEFT HEART CATHETERIZATION WITH CORONARY ANGIOGRAM N/A 06/29/2013  ? Procedure: LEFT HEART CATHETERIZATION WITH CORONARY ANGIOGRAM;  Surgeon: Leonie Man, MD;  Location[facility] C[facility] LAB;  Service: Cardiovascular;  Laterality: N/A;  ? NASAL SINUS SURGERY  06/01/1999  ? NECK SURGERY    ? Cervical fusion  ? PACEMAKER IMPLANT N/A 05/01/2019  ? Procedure: PACEMAKER IMPLANT;  Surgeon: [name], MD;  Location: [facility] CV LAB;  Service: Cardiovascular;  Laterality: N/A;  ? RADIOFREQUENCY ABLATION  09/28/2008  ? atrial fibrillation  ? ROTATOR CUFF REPAIR    ? left  ? SHOULDER OPEN ROTATOR CUFF REPAIR    ? right  ? SPINE SURGERY    ? WRIST ARTHROCENTESIS    ? left  ? ?Family History  ?Problem Relation Age of Onset  ? Heart disease Father   ?     and sister  ? Diabetes Father   ? Hearing loss Father   ? Colon cancer Mother   ?     mets from uterine  ? Uterine cancer Mother   ? Heart disease Sister   ? ?Social History  ? ?Socioeconomic History  ? Marital status: Married  ?  Spouse name: Katharine Look  ? Number of children: 2  ? Years of education: Not on file  ? Highest education level: Not on file  ?Occupational History  ? Occupation: retired  ?Tobacco Use  ? Smoking status: Former  ?  Packs/day: 2.00  ?  Years: 25.00  ?  Pack years: 50.00  ?  Types: Cigarettes, Cigars  ?  Quit date: 04/22/1988  ?  Years since quitting: 33.3  ? Smokeless tobacco: Never  ?Vaping Use  ? Vaping Use: Never used  ?Substance and Sexual Activity  ? Alcohol use: Not Currently  ?  Comment: once every 6-7 months  ? Drug use: No  ? Sexual activity: Not Currently  ?  Birth control/protection: Surgical  ?Other Topics Concern  ? Not on file  ?Social History Narrative  ? 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  ? Currently works part time for [employer].  ? 2 daughters.   ? Married x 43 years  09/2021.  ? ?Social Determinants of Health  ? ?Financial Resource Strain: Low Risk   ? Difficulty of Paying Living Expenses: Not hard at all  ?Food Insecurity: No Food Insecurity  ? Worried About Wellsite geologist

## 2021-08-28 ENCOUNTER — Ambulatory Visit (INDEPENDENT_AMBULATORY_CARE_PROVIDER_SITE_OTHER): Payer: Medicare Other

## 2021-08-28 DIAGNOSIS — I441 Atrioventricular block, second degree: Secondary | ICD-10-CM | POA: Diagnosis not present

## 2021-08-28 LAB — CUP PACEART REMOTE DEVICE CHECK
Battery Remaining Longevity: 80 mo
Battery Remaining Percentage: 78 %
Battery Voltage: 2.99 V
Brady Statistic AP VP Percent: 1 %
Brady Statistic AP VS Percent: 1 %
Brady Statistic AS VP Percent: 95 %
Brady Statistic AS VS Percent: 3.5 %
Brady Statistic RA Percent Paced: 1 %
Brady Statistic RV Percent Paced: 96 %
Date Time Interrogation Session: 20230331020014
Implantable Lead Implant Date: 20201201
Implantable Lead Implant Date: 20201201
Implantable Lead Location: 753859
Implantable Lead Location: 753860
Implantable Pulse Generator Implant Date: 20201201
Lead Channel Impedance Value: 460 Ohm
Lead Channel Impedance Value: 490 Ohm
Lead Channel Pacing Threshold Amplitude: 0.75 V
Lead Channel Pacing Threshold Amplitude: 1 V
Lead Channel Pacing Threshold Pulse Width: 0.5 ms
Lead Channel Pacing Threshold Pulse Width: 0.5 ms
Lead Channel Sensing Intrinsic Amplitude: 2.9 mV
Lead Channel Sensing Intrinsic Amplitude: 3.3 mV
Lead Channel Setting Pacing Amplitude: 2 V
Lead Channel Setting Pacing Amplitude: 2.5 V
Lead Channel Setting Pacing Pulse Width: 0.5 ms
Lead Channel Setting Sensing Sensitivity: 2 mV
Pulse Gen Model: 2272
Pulse Gen Serial Number: 9182765

## 2021-08-31 ENCOUNTER — Encounter: Payer: Self-pay | Admitting: Internal Medicine

## 2021-08-31 NOTE — Progress Notes (Signed)
PERIOPERATIVE PRESCRIPTION FOR IMPLANTED CARDIAC DEVICE PROGRAMMING ? ?Patient Information: ?Name:  Lee [name]  ?DOB:  [birth year]  ?MRN:  [id number]  ?  ?Planned Procedure:  Right thumb CMC arthroplasty  ?Surgeon:  Dr. Erlinda Hong  ?Date of Procedure:  09/02/21  ?Cautery will be used.  ?Position during surgery:  Supine  ? ?Please send documentation back to:  ?[facility] (Fax # [phone])  ?Device Information: ? ?Clinic EP Physician:  [name], MD  ? ?Device Type:  Pacemaker ?Armed forces logistics/support/administrative officer #:  St. Jude/Abbott: [phone] ?Pacemaker Dependent?:  Yes.   ?Date of Last Device Check:  08/28/2021 Normal Device Function?:  Yes.   ? ?Electrophysiologist's Recommendations: ? ?Have magnet available. ?Provide continuous ECG monitoring when magnet is used or reprogramming is to be performed.  ?Procedure may interfere with device function.  Magnet should be placed over device during procedure. ? ?Per Device Clinic Standing Orders, ?Simone Curia, RN  ?10:18 PM 08/31/2021

## 2021-09-02 ENCOUNTER — Ambulatory Visit (HOSPITAL_BASED_OUTPATIENT_CLINIC_OR_DEPARTMENT_OTHER): Payer: Medicare Other | Admitting: Anesthesiology

## 2021-09-02 ENCOUNTER — Encounter (HOSPITAL_BASED_OUTPATIENT_CLINIC_OR_DEPARTMENT_OTHER): Payer: Self-pay | Admitting: Orthopaedic Surgery

## 2021-09-02 ENCOUNTER — Ambulatory Visit (HOSPITAL_BASED_OUTPATIENT_CLINIC_OR_DEPARTMENT_OTHER): Payer: Medicare Other

## 2021-09-02 ENCOUNTER — Ambulatory Visit (HOSPITAL_BASED_OUTPATIENT_CLINIC_OR_DEPARTMENT_OTHER)
Admission: RE | Admit: 2021-09-02 | Discharge: 2021-09-02 | Disposition: A | Discharge status: Home / Self Care | Payer: Medicare Other | Source: Ambulatory Visit | Attending: Orthopaedic Surgery | Admitting: Orthopaedic Surgery

## 2021-09-02 ENCOUNTER — Encounter (HOSPITAL_BASED_OUTPATIENT_CLINIC_OR_DEPARTMENT_OTHER)
Admission: RE | Disposition: A | Discharge status: Home / Self Care | Payer: Self-pay | Source: Ambulatory Visit | Attending: Orthopaedic Surgery

## 2021-09-02 ENCOUNTER — Other Ambulatory Visit: Payer: Self-pay

## 2021-09-02 DIAGNOSIS — I119 Hypertensive heart disease without heart failure: Secondary | ICD-10-CM | POA: Insufficient documentation

## 2021-09-02 DIAGNOSIS — Z87891 Personal history of nicotine dependence: Secondary | ICD-10-CM | POA: Insufficient documentation

## 2021-09-02 DIAGNOSIS — I4891 Unspecified atrial fibrillation: Secondary | ICD-10-CM | POA: Insufficient documentation

## 2021-09-02 DIAGNOSIS — M1811 Unilateral primary osteoarthritis of first carpometacarpal joint, right hand: Secondary | ICD-10-CM

## 2021-09-02 DIAGNOSIS — Z794 Long term (current) use of insulin: Secondary | ICD-10-CM | POA: Insufficient documentation

## 2021-09-02 DIAGNOSIS — K219 Gastro-esophageal reflux disease without esophagitis: Secondary | ICD-10-CM | POA: Insufficient documentation

## 2021-09-02 DIAGNOSIS — I2510 Atherosclerotic heart disease of native coronary artery without angina pectoris: Secondary | ICD-10-CM | POA: Diagnosis not present

## 2021-09-02 DIAGNOSIS — Z955 Presence of coronary angioplasty implant and graft: Secondary | ICD-10-CM | POA: Insufficient documentation

## 2021-09-02 DIAGNOSIS — E669 Obesity, unspecified: Secondary | ICD-10-CM | POA: Insufficient documentation

## 2021-09-02 DIAGNOSIS — E119 Type 2 diabetes mellitus without complications: Secondary | ICD-10-CM | POA: Diagnosis not present

## 2021-09-02 DIAGNOSIS — I252 Old myocardial infarction: Secondary | ICD-10-CM

## 2021-09-02 DIAGNOSIS — I10 Essential (primary) hypertension: Secondary | ICD-10-CM

## 2021-09-02 DIAGNOSIS — J449 Chronic obstructive pulmonary disease, unspecified: Secondary | ICD-10-CM | POA: Insufficient documentation

## 2021-09-02 DIAGNOSIS — E785 Hyperlipidemia, unspecified: Secondary | ICD-10-CM | POA: Diagnosis not present

## 2021-09-02 HISTORY — PX: FINGER ARTHROPLASTY: SHX5017

## 2021-09-02 LAB — GLUCOSE, CAPILLARY
Glucose-Capillary: 116 mg/dL — ABNORMAL HIGH (ref 70–99)
Glucose-Capillary: 112 mg/dL — ABNORMAL HIGH (ref 70–99)

## 2021-09-02 SURGERY — ARTHROPLASTY, FINGER
Anesthesia: Monitor Anesthesia Care | Site: Finger | Laterality: Right

## 2021-09-02 MED ORDER — CEFAZOLIN SODIUM-DEXTROSE 2-4 GM/100ML-% IV SOLN
INTRAVENOUS | Status: AC
  Filled 2021-09-02: qty 100

## 2021-09-02 MED ORDER — PROPOFOL 500 MG/50ML IV EMUL
INTRAVENOUS | Status: DC | PRN
  Administered 2021-09-02: 75 ug/kg/min via INTRAVENOUS

## 2021-09-02 MED ORDER — FENTANYL CITRATE (PF) 100 MCG/2ML IJ SOLN: 25.0000 ug | INTRAMUSCULAR | Status: DC | PRN

## 2021-09-02 MED ORDER — MIDAZOLAM HCL 2 MG/2ML IJ SOLN
INTRAMUSCULAR | Status: AC
Start: 2021-09-02 — End: ?
  Filled 2021-09-02: qty 2

## 2021-09-02 MED ORDER — FENTANYL CITRATE (PF) 100 MCG/2ML IJ SOLN
100.0000 ug | Freq: Once | INTRAMUSCULAR | Status: AC
  Administered 2021-09-02: 100 ug via INTRAVENOUS

## 2021-09-02 MED ORDER — LIDOCAINE 2% (20 MG/ML) 5 ML SYRINGE
INTRAMUSCULAR | Status: DC | PRN
  Administered 2021-09-02: 30 mg via INTRAVENOUS

## 2021-09-02 MED ORDER — ONDANSETRON HCL 4 MG PO TABS: 4.0000 mg | ORAL_TABLET | Freq: Three times a day (TID) | ORAL | 0 refills | Status: DC | PRN

## 2021-09-02 MED ORDER — FENTANYL CITRATE (PF) 100 MCG/2ML IJ SOLN
INTRAMUSCULAR | Status: AC
  Filled 2021-09-02: qty 2

## 2021-09-02 MED ORDER — OXYCODONE HCL 5 MG PO TABS: 5.0000 mg | ORAL_TABLET | Freq: Once | ORAL | Status: DC | PRN

## 2021-09-02 MED ORDER — OXYCODONE-ACETAMINOPHEN 5-325 MG PO TABS: 1.0000 | ORAL_TABLET | Freq: Three times a day (TID) | ORAL | 0 refills | Status: DC | PRN

## 2021-09-02 MED ORDER — ROPIVACAINE HCL 5 MG/ML IJ SOLN
INTRAMUSCULAR | Status: DC | PRN
  Administered 2021-09-02: 30 mL via EPIDURAL

## 2021-09-02 MED ORDER — LACTATED RINGERS IV SOLN: INTRAVENOUS | Status: DC

## 2021-09-02 MED ORDER — CEFAZOLIN SODIUM-DEXTROSE 2-4 GM/100ML-% IV SOLN: 2.0000 g | INTRAVENOUS | Status: DC

## 2021-09-02 MED ORDER — OXYCODONE HCL 5 MG/5ML PO SOLN: 5.0000 mg | Freq: Once | ORAL | Status: DC | PRN

## 2021-09-02 MED ORDER — MIDAZOLAM HCL 2 MG/2ML IJ SOLN
2.0000 mg | Freq: Once | INTRAMUSCULAR | Status: AC
Start: 2021-09-02 — End: 2021-09-02
  Administered 2021-09-02: 2 mg via INTRAVENOUS

## 2021-09-02 MED ORDER — ONDANSETRON HCL 4 MG/2ML IJ SOLN
INTRAMUSCULAR | Status: DC | PRN
  Administered 2021-09-02: 4 mg via INTRAVENOUS

## 2021-09-02 SURGICAL SUPPLY — 70 items
ANCHOR FIBERLOCK SUSPENSION (Anchor) ×1 IMPLANT
BAND INSRT 18 STRL LF DISP RB (MISCELLANEOUS) ×1
BAND RUBBER #18 3X1/16 STRL (MISCELLANEOUS) ×2 IMPLANT
BLADE MINI RND TIP GREEN BEAV (BLADE) ×2 IMPLANT
BLADE SURG 15 STRL LF DISP TIS (BLADE) ×2 IMPLANT
BLADE SURG 15 STRL SS (BLADE) ×4
BNDG CMPR 9X4 STRL LF SNTH (GAUZE/BANDAGES/DRESSINGS) ×1
BNDG ELASTIC 3X5.8 VLCR STR LF (GAUZE/BANDAGES/DRESSINGS) ×2 IMPLANT
BNDG ESMARK 4X9 LF (GAUZE/BANDAGES/DRESSINGS) ×2 IMPLANT
BRUSH SCRUB EZ PLAIN DRY (MISCELLANEOUS) ×2 IMPLANT
CORD BIPOLAR FORCEPS 12FT (ELECTRODE) ×2 IMPLANT
COVER BACK TABLE 60X90IN (DRAPES) ×2 IMPLANT
CUFF TOURN SGL QUICK 18X4 (TOURNIQUET CUFF) IMPLANT
DRAPE EXTREMITY T 121X128X90 (DISPOSABLE) ×2 IMPLANT
DRAPE IMP U-DRAPE 54X76 (DRAPES) ×2 IMPLANT
DRAPE OEC MINIVIEW 54X84 (DRAPES) IMPLANT
DRAPE SURG 17X23 STRL (DRAPES) ×2 IMPLANT
GAUZE 4X4 16PLY ~~LOC~~+RFID DBL (SPONGE) IMPLANT
GAUZE SPONGE 4X4 12PLY STRL (GAUZE/BANDAGES/DRESSINGS) ×2 IMPLANT
GAUZE XEROFORM 1X8 LF (GAUZE/BANDAGES/DRESSINGS) ×2 IMPLANT
GLOVE BIOGEL PI IND STRL 7.0 (GLOVE) ×1 IMPLANT
GLOVE BIOGEL PI INDICATOR 7.0 (GLOVE) ×1
GLOVE SRG 8 PF TXTR STRL LF DI (GLOVE) ×1 IMPLANT
GLOVE SURG NEOP MICRO LF SZ7.5 (GLOVE) ×2 IMPLANT
GLOVE SURG SYN 7.5  E (GLOVE) ×4
GLOVE SURG SYN 7.5 E (GLOVE) ×2 IMPLANT
GLOVE SURG SYN 7.5 PF PI (GLOVE) ×2 IMPLANT
GLOVE SURG UNDER POLY LF SZ8 (GLOVE) ×2
GOWN STRL REIN XL XLG (GOWN DISPOSABLE) ×4 IMPLANT
GOWN STRL REUS W/ TWL LRG LVL3 (GOWN DISPOSABLE) ×1 IMPLANT
GOWN STRL REUS W/ TWL XL LVL3 (GOWN DISPOSABLE) ×1 IMPLANT
GOWN STRL REUS W/TWL LRG LVL3 (GOWN DISPOSABLE) ×2
GOWN STRL REUS W/TWL XL LVL3 (GOWN DISPOSABLE) ×2
NDL HYPO 25X1 1.5 SAFETY (NEEDLE) IMPLANT
NEEDLE HYPO 25X1 1.5 SAFETY (NEEDLE) IMPLANT
NS IRRIG 1000ML POUR BTL (IV SOLUTION) ×2 IMPLANT
PACK BASIN DAY SURGERY FS (CUSTOM PROCEDURE TRAY) ×2 IMPLANT
PAD CAST 3X4 CTTN HI CHSV (CAST SUPPLIES) ×1 IMPLANT
PADDING CAST ABS 3INX4YD NS (CAST SUPPLIES)
PADDING CAST ABS 4INX4YD NS (CAST SUPPLIES) ×1
PADDING CAST ABS COTTON 3X4 (CAST SUPPLIES) IMPLANT
PADDING CAST ABS COTTON 4X4 ST (CAST SUPPLIES) ×1 IMPLANT
PADDING CAST COTTON 3X4 STRL (CAST SUPPLIES) ×2
SHEET MEDIUM DRAPE 40X70 STRL (DRAPES) ×2 IMPLANT
SLEEVE SCD COMPRESS KNEE MED (STOCKING) ×2 IMPLANT
SPIKE FLUID TRANSFER (MISCELLANEOUS) IMPLANT
SPLINT FIBERGLASS 3X35 (CAST SUPPLIES) ×1 IMPLANT
STOCKINETTE 4X48 STRL (DRAPES) ×2 IMPLANT
SUCTION FRAZIER HANDLE 10FR (MISCELLANEOUS)
SUCTION TUBE FRAZIER 10FR DISP (MISCELLANEOUS) IMPLANT
SUT ETHIBOND 0 MO6 C/R (SUTURE) IMPLANT
SUT ETHILON 4 0 PS 2 18 (SUTURE) ×2 IMPLANT
SUT FIBERWIRE #2 38 T-5 BLUE (SUTURE)
SUT FIBERWIRE 2-0 18 17.9 3/8 (SUTURE)
SUT MNCRL AB 4-0 PS2 18 (SUTURE) IMPLANT
SUT VIC AB 2-0 CT1 27 (SUTURE)
SUT VIC AB 2-0 CT1 TAPERPNT 27 (SUTURE) IMPLANT
SUT VIC AB 2-0 SH 27 (SUTURE) ×2
SUT VIC AB 2-0 SH 27XBRD (SUTURE) ×1 IMPLANT
SUT VICRYL 0 SH 27 (SUTURE) IMPLANT
SUTURE FIBERWR #2 38 T-5 BLUE (SUTURE) IMPLANT
SUTURE FIBERWR 2-0 18 17.9 3/8 (SUTURE) IMPLANT
SUTURE TAPE 1.3 FIBERLOP 20 ST (SUTURE) IMPLANT
SUTURETAPE 1.3 FIBERLOOP 20 ST (SUTURE)
SYR BULB EAR ULCER 3OZ GRN STR (SYRINGE) ×2 IMPLANT
SYR CONTROL 10ML LL (SYRINGE) IMPLANT
TOWEL GREEN STERILE FF (TOWEL DISPOSABLE) ×4 IMPLANT
TRAY DSU PREP LF (CUSTOM PROCEDURE TRAY) ×2 IMPLANT
TUBE CONNECTING 20X1/4 (TUBING) ×2 IMPLANT
UNDERPAD 30X36 HEAVY ABSORB (UNDERPADS AND DIAPERS) ×2 IMPLANT

## 2021-09-02 NOTE — Discharge Instructions (Addendum)
? ?Postoperative instructions ? ?Dressing instructions: Keep your dressing and/or splint clean and dry at all times.  It will be removed at your first post-operative appointment.  Your stitches and/or staples will be removed at this visit. ? ?Incision instructions:  Do not soak your incision for 3 weeks after surgery.  If the incision gets wet, pat dry and do not scrub the incision. ? ?Pain control:  You have been given a prescription to be taken as directed for post-operative pain control.  In addition, elevate the operative extremity above the heart at all times to prevent swelling and throbbing pain. ? ?Take over-the-counter Colace, '100mg'$  by mouth twice a day while taking narcotic pain medications to help prevent constipation. ? ?Follow up appointments: ?1) 14 days for suture removal and wound check. ?2) Dr. Erlinda Hong as scheduled. ? ? ------------------------------------------------------------------------------------------------------------- ? ?After Surgery Pain Control: ? ?After your surgery, post-surgical discomfort or pain is likely. This discomfort can last several days to a few weeks. At certain times of the day your discomfort may be more intense.  ?Did you receive a nerve block?  ?A nerve block can provide pain relief for one hour to two days after your surgery. As long as the nerve block is working, you will experience little or no sensation in the area the surgeon operated on.  ?As the nerve block wears off, you will begin to experience pain or discomfort. It is very important that you begin taking your prescribed pain medication before the nerve block fully wears off. Treating your pain at the first sign of the block wearing off will ensure your pain is better controlled and more tolerable when full-sensation returns. Do not wait until the pain is intolerable, as the medicine will be less effective. It is better to treat pain in advance than to try and catch up.  ?General Anesthesia:  ?If you did not  receive a nerve block during your surgery, you will need to start taking your pain medication shortly after your surgery and should continue to do so as prescribed by your surgeon.  ?Pain Medication:  ?Most commonly we prescribe Vicodin and Percocet for post-operative pain. Both of these medications contain a combination of acetaminophen (Tylenol?) and a narcotic to help control pain.  ?? It takes between 30 and 45 minutes before pain medication starts to work. It is important to take your medication before your pain level gets too intense.  ?? Nausea is a common side effect of many pain medications. You will want to eat something before taking your pain medicine to help prevent nausea.  ?? If you are taking a prescription pain medication that contains acetaminophen, we recommend that you do not take additional over the counter acetaminophen (Tylenol?).  ?Other pain relieving options:  ?? Using a cold pack to ice the affected area a few times a day (15 to 20 minutes at a time) can help to relieve pain, reduce swelling and bruising.  ?? Elevation of the affected area can also help to reduce pain and swelling. ? ? ?Post Anesthesia Home Care Instructions ? ?Activity: ?Get plenty of rest for the remainder of the day. A responsible individual must stay with you for 24 hours following the procedure.  ?For the next 24 hours, DO NOT: ?-Drive a car ?-Paediatric nurse ?-Drink alcoholic beverages ?-Take any medication unless instructed by your physician ?-Make any legal decisions or sign important papers. ? ?Meals: ?Start with liquid foods such as gelatin or soup. Progress to regular foods as  tolerated. Avoid greasy, spicy, heavy foods. If nausea and/or vomiting occur, drink only clear liquids until the nausea and/or vomiting subsides. Call your physician if vomiting continues. ? ?Special Instructions/Symptoms: ?Your throat may feel dry or sore from the anesthesia or the breathing tube placed in your throat during surgery. If  this causes discomfort, gargle with warm salt water. The discomfort should disappear within 24 hours. ? ?If you had a scopolamine patch placed behind your ear for the management of post- operative nausea and/or vomiting: ? ?1. The medication in the patch is effective for 72 hours, after which it should be removed.  Wrap patch in a tissue and discard in the trash. Wash hands thoroughly with soap and water. ?2. You may remove the patch earlier than 72 hours if you experience unpleasant side effects which may include dry mouth, dizziness or visual disturbances. ?3. Avoid touching the patch. Wash your hands with soap and water after contact with the patch. ? ?Regional Anesthesia Blocks ? ?1. Numbness or the inability to move the "blocked" extremity may last from 3-48 hours after placement. The length of time depends on the medication injected and your individual response to the medication. If the numbness is not going away after 48 hours, call your surgeon. ? ?2. The extremity that is blocked will need to be protected until the numbness is gone and the  Strength has returned. Because you cannot feel it, you will need to take extra care to avoid injury. Because it may be weak, you may have difficulty moving it or using it. You may not know what position it is in without looking at it while the block is in effect. ? ?3. For blocks in the legs and feet, returning to weight bearing and walking needs to be done carefully. You will need to wait until the numbness is entirely gone and the strength has returned. You should be able to move your leg and foot normally before you try and bear weight or walk. You will need someone to be with you when you first try to ensure you do not fall and possibly risk injury. ? ?4. Bruising and tenderness at the needle site are common side effects and will resolve in a few days. ? ?5. Persistent numbness or new problems with movement should be communicated to the surgeon or the [facility] [phone] [place] [phone]).  ?    ? ?

## 2021-09-02 NOTE — Anesthesia Preprocedure Evaluation (Addendum)
Anesthesia Evaluation  ?Patient identified by MRN, date of birth, ID band ?Patient awake ? ? ? ?Reviewed: ?Allergy & Precautions, NPO status , Patient's Chart, lab work & pertinent test results, reviewed documented beta blocker date and time  ? ?Airway ?Mallampati: II ? ?TM Distance: >3 FB ?Neck ROM: Full ? ? ? Dental ? ?(+) Teeth Intact, Dental Advisory Given ?  ?Pulmonary ?sleep apnea , COPD,  COPD inhaler, former smoker,  ?  ?Pulmonary exam normal ?breath sounds clear to auscultation ? ? ? ? ? ? Cardiovascular ?hypertension, Pt. on medications ?+ CAD, + Past MI and + Cardiac Stents  ?Normal cardiovascular exam+ dysrhythmias Atrial Fibrillation  ?Rhythm:Regular Rate:Normal ? ?Hx/o MI 2009 S/P PCI with DES mLAD ?Stents x 2 LAD and RCA 2012 ? ?Hx/o atrial fibrillation ablation ? ?Hx/o complete heart block S/P PPM  ? ?EKG 10/21/20 ?Atrial sensed ventricular paced rhythm ? ?Echo 05/01/19 ?1. Left ventricular ejection fraction, by visual estimation, is 60 to 65%. The left ventricle has normal function. Left ventricular septal wall thickness was mildly increased. There is no left ventricular hypertrophy.  ??2. Definity contrast agent was given IV to delineate the left ventricular endocardial borders.  ??3. Left ventricular diastolic parameters are indeterminate.  ??4. Global right ventricle has normal systolic function.The right ventricular size is normal. No increase in right ventricular wall thickness.  ??5. Left atrial size was moderately dilated.  ??6. Right atrial size was normal.  ??7. The mitral valve is normal in structure. Trace mitral valve regurgitation. No evidence of mitral stenosis.  ??8. The tricuspid valve is normal in structure. Tricuspid valve regurgitation is not demonstrated.  ??9. The aortic valve is tricuspid. Aortic valve regurgitation is not visualized. Mild to moderate aortic valve sclerosis/calcification without any evidence of aortic stenosis.  ?10. Small  gradient across AV with no significant AS.  ?11. The pulmonic valve was normal in structure. Pulmonic valve regurgitation is not visualized.  ?12. The inferior vena cava is normal in size with greater than 50% respiratory variability, suggesting right atrial pressure of 3 mmHg.  ?13. The interatrial septum was not well visualized.  ?  ?Neuro/Psych ? Headaches, PSYCHIATRIC DISORDERS Depression   ? GI/Hepatic ?Neg liver ROS, GERD  Medicated and Controlled,  ?Endo/Other  ?diabetes, Well Controlled, Type 2, Oral Hypoglycemic AgentsObesity ?Hyperlipidemia ? Renal/GU ?negative Renal ROS  ?negative genitourinary ?  ?Musculoskeletal ? ?(+) Arthritis , Osteoarthritis,  Right Carpometacarpal DJD  ? Abdominal ?(+) + obese,   ?Peds ? Hematology ? ?(+) Blood dyscrasia, anemia ,   ?Anesthesia Other Findings ? ? Reproductive/Obstetrics ? ?  ? ? ? ? ? ? ? ? ? ? ? ? ? ?  ?  ? ? ? ? ? ? ? ?Anesthesia Physical ?Anesthesia Plan ? ?ASA: 3 ? ?Anesthesia Plan: MAC and Regional  ? ?Post-op Pain Management: Minimal or no pain anticipated  ? ?Induction: Intravenous ? ?PONV Risk Score and Plan: 1 and Treatment may vary due to age or medical condition and Propofol infusion ? ?Airway Management Planned: Natural Airway and Simple Face Mask ? ?Additional Equipment: None ? ?Intra-op Plan:  ? ?Post-operative Plan:  ? ?Informed Consent: I have reviewed the patients History and Physical, chart, labs and discussed the procedure including the risks, benefits and alternatives for the proposed anesthesia with the patient or authorized representative who has indicated his/her understanding and acceptance.  ? ? ? ?Dental advisory given ? ?Plan Discussed with: CRNA and Anesthesiologist ? ?Anesthesia Plan Comments:   ? ? ? ? ? ?  Anesthesia Quick Evaluation ? ?

## 2021-09-02 NOTE — Anesthesia Procedure Notes (Signed)
Anesthesia Regional Block: Supraclavicular block  ? ?Pre-Anesthetic Checklist: , timeout performed,  Correct Patient, Correct Site, Correct Laterality,  Correct Procedure, Correct Position, site marked,  Risks and benefits discussed,  Surgical consent,  Pre-op evaluation,  At surgeon's request ? ?Laterality: Right ? ?Prep: chloraprep     ?  ?Needles:  ?Injection technique: Single-shot ? ?Needle Type: Echogenic Stimulator Needle   ? ? ?Needle Length: 10cm  ?Needle Gauge: 21  ? ?Needle insertion depth: 6.5 cm ? ? ?Additional Needles: ? ? ?Procedures:,,,, ultrasound used (permanent image in chart),,    ?Narrative:  ?Start time: 09/02/2021 11:52 AM ?End time: 09/02/2021 11:57 AM ?Injection made incrementally with aspirations every 5 mL. ? ?Performed by: Personally  ?Anesthesiologist: Josephine Igo, MD ? ?Additional Notes: ?Timeout performed. Patient sedated. Relevant anatomy ID'd using [place]. Incremental 2-76m injection of LA with frequent aspiration. Patient tolerated procedure well. ? ? ? ?Right Supraclavicular Block ? ?

## 2021-09-02 NOTE — Anesthesia Postprocedure Evaluation (Signed)
Anesthesia Post Note ? ?Patient: Lee [name] ? ?Procedure(s) Performed: RIGHT THUMB CARPOMETACARPAL ARTHROPLASTY (Right: Finger) ? ?  ? ?Patient location during evaluation: PACU ?Anesthesia Type: Regional and MAC ?Level of consciousness: awake and alert and oriented ?Pain management: pain level controlled ?Vital Signs Assessment: post-procedure vital signs reviewed and stable ?Respiratory status: spontaneous breathing, nonlabored ventilation and respiratory function stable ?Cardiovascular status: stable and blood pressure returned to baseline ?Postop Assessment: no apparent nausea or vomiting ?Anesthetic complications: no ? ? ?No notable events documented. ? ?Last Vitals:  ?Vitals:  ? 09/02/21 1430 09/02/21 1445  ?BP: 123/73   ?Pulse: 62 61  ?Resp: 18 18  ?Temp:    ?SpO2: 99% 91%  ?  ?Last Pain:  ?Vitals:  ? 09/02/21 1430  ?TempSrc:   ?PainSc: 0-No pain  ? ? ?  ?  ?  ?  ?  ?  ? ?[name] ? ? ? ? ?

## 2021-09-02 NOTE — Anesthesia Procedure Notes (Signed)
Procedure Name: [place] ?Date/Time: 09/02/2021 1:50 PM ?Performed by: Signe Colt, CRNA ?Pre-anesthesia Checklist: Patient identified, Emergency Drugs available, Suction available, Patient being monitored and Timeout performed ?Patient Re-evaluated:Patient Re-evaluated prior to induction ?Oxygen Delivery Method: Simple face mask ? ? ? ? ?

## 2021-09-02 NOTE — Transfer of Care (Signed)
Immediate Anesthesia Transfer of Care Note ? ?Patient: JAISHAWN WITZKE ? ?Procedure(s) Performed: RIGHT THUMB CARPOMETACARPAL ARTHROPLASTY (Right: Finger) ? ?Patient Location: PACU ? ?Anesthesia Type:MAC combined with regional for post-op pain ? ?Level of Consciousness: awake, alert , oriented and patient cooperative ? ?Airway & Oxygen Therapy: Patient Spontanous Breathing and Patient connected to face mask oxygen ? ?Post-op Assessment: Report given to RN and Post -op Vital signs reviewed and stable ? ?Post vital signs: Reviewed and stable ? ?Last Vitals:  ?Vitals Value Taken Time  ?BP    ?Temp    ?Pulse 60 09/02/21 1405  ?Resp 13 09/02/21 1405  ?SpO2 97 % 09/02/21 1405  ?Vitals shown include unvalidated device data. ? ?Last Pain:  ?Vitals:  ? 09/02/21 1103  ?TempSrc: Oral  ?PainSc: 2   ?   ? ?Patients Stated Pain Goal: 4 (09/02/21 1103) ? ?Complications: No notable events documented. ?

## 2021-09-02 NOTE — Progress Notes (Signed)
Assisted Dr. Royce Macadamia with right, supraclavicular block. Side rails up, monitors on throughout procedure. See vital signs in flow sheet. Tolerated Procedure well. ?

## 2021-09-02 NOTE — H&P (Signed)
? ? ?PREOPERATIVE H&P ? ?Chief Complaint: right thumb carpometacarpal degenerative joint disease ? ?HPI: ?Lee [name] is a 71 y.o. male who presents for surgical treatment of right thumb carpometacarpal degenerative joint disease.  He denies any changes in medical history. ? ?Past Medical History:  ?Diagnosis Date  ? Allergic rhinitis   ? chronic sinusitis  ? Allergy   ? Arthritis   ? CAD (coronary artery disease)   ? a. 2009 PCI/DES to mLAD; b. 05/2010 s/p DES RCA and LAD.; c. 05/2013 Cath: LAD mild ISR, LCX nl, RCA 40p, patent stents.  ? Cataract   ? has had lasik surg  ? COPD (chronic obstructive pulmonary disease) ([place])   ? Depression   ? Diabetes mellitus type 2, controlled, without complications ([place])   ? Diabetes mellitus without complication ([place])   ? Phreesia 08/22/2020  ? Diverticulosis   ? DJD (degenerative joint disease)   ? Esophagitis 1991  ? grade 1  ? GERD (gastroesophageal reflux disease)   ? Hiatal hernia  ? Hemorrhoids   ? Hyperlipidemia   ? Hypertension   ? Hypertensive heart disease   ? Iron deficiency anemia   ? Migraines   ? Myocardial infarction [facility])   ? Paroxysmal atrial fibrillation ([facility])   ? a. s/p afib ablation 10/22/08 ([facility]).  ? PVC's (premature ventricular contractions)   ? Sleep apnea   ? Questionalble: RDI during the total sleep time 6h 28 mins was 3.55/hr during REM sleep was at 5.71/hr. Supine AHI was 5.93/hr.  ? Syncope 08/2018  ? ?Past Surgical History:  ?Procedure Laterality Date  ? ABDOMINAL HYSTERECTOMY    ? CARDIAC CATHETERIZATION  05/31/2010  ? The proximal LAD was then predilated with 2.0 x 12 trek. this was then stented with a 2.5 x 16 promus Element drug-eluting stent at 14 atmosphere and prostdilated with 2.75 x 12 [place] trek at 16 atmospheres(2.8 mm) resulting the reduction of 80% proximal LAD stenosis to 0% residual with excellent flow.  ? CARDIAC CATHETERIZATION  06/05/2010  ? Successful percutaneous coronary intervention to the right coronary artery with  percutaneous transluminal coronary angioplasty/stenting and insertion 3.0 x 16 mm Promus DES post dilated to 3.35 mm with stenosis being reduced to 0%  ? CARDIAC CATHETERIZATION  02/29/2008  ? Post dilatation was performed using a 2.75 x 9 Stanley sprinter, 10 atmospheres for 40 seconds and then 9 atmosphere for 35 sec. This resulted in the 80% area of narrowing pre-intervention, now appearing to normal. There was no edvidence of the dissection or thrombus and there was TIMI III flow pre and post.  ? CARDIAC CATHETERIZATION  02/28/2006  ? CORONARY ANGIOPLASTY WITH STENT PLACEMENT    ? 3 stents/ 4 caths  ? EYE SURGERY    ? KNEE ARTHROSCOPY    ? right  ? LASIK    ? LEFT HEART CATHETERIZATION WITH CORONARY ANGIOGRAM N/A 06/29/2013  ? Procedure: LEFT HEART CATHETERIZATION WITH CORONARY ANGIOGRAM;  Surgeon: Leonie Man, MD;  Location[facility] C[facility] LAB;  Service: Cardiovascular;  Laterality: N/A;  ? NASAL SINUS SURGERY  06/01/1999  ? NECK SURGERY    ? Cervical fusion  ? PACEMAKER IMPLANT N/A 05/01/2019  ? Procedure: PACEMAKER IMPLANT;  Surgeon: [name], MD;  Location: [facility] CV LAB;  Service: Cardiovascular;  Laterality: N/A;  ? RADIOFREQUENCY ABLATION  09/28/2008  ? atrial fibrillation  ? ROTATOR CUFF REPAIR    ? left  ? SHOULDER OPEN ROTATOR CUFF REPAIR    ? right  ?  SPINE SURGERY    ? WRIST ARTHROCENTESIS    ? left  ? ?Social History  ? ?Socioeconomic History  ? Marital status: Married  ?  Spouse name: Katharine Look  ? Number of children: 2  ? Years of education: Not on file  ? Highest education level: Not on file  ?Occupational History  ? Occupation: retired  ?Tobacco Use  ? Smoking status: Former  ?  Packs/day: 2.00  ?  Years: 25.00  ?  Pack years: 50.00  ?  Types: Cigarettes, Cigars  ?  Quit date: 04/22/1988  ?  Years since quitting: 33.3  ? Smokeless tobacco: Never  ?Vaping Use  ? Vaping Use: Never used  ?Substance and Sexual Activity  ? Alcohol use: Not Currently  ?  Comment: once every 6-7 months  ? Drug use: No  ?  Sexual activity: Not Currently  ?  Birth control/protection: Surgical  ?Other Topics Concern  ? Not on file  ?Social History Narrative  ? 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  ? Currently works part time for [employer].  ? 2 daughters.   ? Married x 43 years 09/2021.  ? ?Social Determinants of Health  ? ?Financial Resource Strain: Low Risk   ? Difficulty of Paying Living Expenses: Not hard at all  ?Food Insecurity: No Food Insecurity  ? Worried About Charity fundraiser in the Last Year: Never true  ? Ran Out of Food in the Last Year: Never true  ?Transportation Needs: No Transportation Needs  ? Lack of Transportation (Medical): No  ? Lack of Transportation (Non-Medical): No  ?Physical Activity: Sufficiently Active  ? Days of Exercise per Week: 4 days  ? Minutes of Exercise per Session: 60 min  ?Stress: No Stress Concern Present  ? Feeling of Stress : Not at all  ?Social Connections: Socially Integrated  ? Frequency of Communication with Friends and Family: More than three times a week  ? Frequency of Social Gatherings with Friends and Family: More than three times a week  ? Attends Religious Services: More than 4 times per year  ? Active Member of Clubs or Organizations: Yes  ? Attends Archivist Meetings: More than 4 times per year  ? Marital Status: Married  ? ?Family History  ?Problem Relation Age of Onset  ? Heart disease Father   ?     and sister  ? Diabetes Father   ? Hearing loss Father   ? Colon cancer Mother   ?     mets from uterine  ? Uterine cancer Mother   ? Heart disease Sister   ? ?Allergies  ?Allergen Reactions  ? Ibuprofen Swelling  ?  Whole body swelled  ? Other Shortness Of Breath  ?  BETA BLOCKERS  ? Topamax [Topiramate] Other (See Comments)  ?  Pt states it made his tongue feel "scalded" and he couldn't eat   ? Toprol Xl [Metoprolol Succinate] Other (See Comments)  ?  Personality change  ? Metoprolol-Hctz Er Other (See Comments)  ?  Indigestion,mouth raw   ? ?Prior to Admission medications   ?Medication Sig Start Date End Date Taking? Authorizing Provider  ?acetaminophen (TYLENOL) 325 MG tablet Take 1-2 tablets (325-650 mg total) by mouth every 4 (four) hours as needed for mild pain. 05/02/19  Yes Shirley Friar, PA-C  ?amLODipine (NORVASC) 10 MG tablet TAKE 1 TABLET(10 MG) BY MOUTH DAILY 07/23/20  Yes Susy Frizzle, MD  ?aspirin EC 81  MG tablet Take 81 mg by mouth at bedtime.   Yes [provider]  ?cholecalciferol (VITAMIN D) 1000 UNITS tablet Take 1,000 Units by mouth at bedtime.    Yes [provider]  ?citalopram (CELEXA) 40 MG tablet TAKE 1 TABLET(40 MG) BY MOUTH DAILY 04/20/21  Yes Susy Frizzle, MD  ?Coenzyme Q10 (CO Q 10) 100 MG CAPS Take 100 mg by mouth daily with breakfast.    Yes [provider]  ?Erenumab-aooe (AIMOVIG) 140 MG/ML SOAJ Inject 140 mg into the skin every 30 (thirty) days. On or about the 1st of each month   Yes [provider]  ?fexofenadine (ALLEGRA) 180 MG tablet Take 180 mg by mouth daily with breakfast.    Yes [provider]  ?fluticasone (FLONASE) 50 MCG/ACT nasal spray INSTILL 2 SPRAYS IN THE  NOSE AT BEDTIME 04/06/17  Yes Susy Frizzle, MD  ?furosemide (LASIX) 40 MG tablet TAKE 1 TABLET BY MOUTH  DAILY 11/17/20  Yes Troy Sine, MD  ?JARDIANCE 25 MG TABS tablet TAKE 1 TABLET BY MOUTH DAILY BEFORE BREAKFAST 04/20/21  Yes Susy Frizzle, MD  ?Multiple Vitamin (MULTIVITAMIN WITH MINERALS) TABS tablet Take 1 tablet by mouth daily with breakfast.   Yes [provider]  ?oxyCODONE-acetaminophen (PERCOCET) 10-325 MG tablet Take 1 tablet by mouth every 8 (eight) hours as needed for pain. 05/05/21  Yes Susy Frizzle, MD  ?pantoprazole (PROTONIX) 40 MG tablet TAKE 1 TABLET BY MOUTH  DAILY 06/30/21  Yes Troy Sine, MD  ?Polyvinyl Alcohol-Povidone (REFRESH OP) Place 1 drop into both eyes at bedtime as needed (dry eyes).   Yes [provider]   ?potassium chloride (MICRO-K) 10 MEQ CR capsule TAKE 1 CAPSULE BY MOUTH  DAILY 07/10/21  Yes Troy Sine, MD  ?Rimegepant Sulfate (NURTEC PO) Take by mouth.   Yes [provider]  ?rosuvastatin (CRESTOR) 40

## 2021-09-02 NOTE — Op Note (Signed)
? ?  DATE OF SURGERY:09/02/2021 ? ?PREOPERATIVE DIAGNOSIS: Right thumb CMC joint arthritis. ? ?POSTOPERATIVE DIAGNOSIS: same. ? ?PROCEDURE:  ?1. Right thumb carpometacarpal interposition arthroplasty ([id number]); thumb  ? ?IMPLANTS: Arthrex  ? ?SURGEON: N. Eduard Roux, MD ? ?ASSIST: Madalyn Rob, PA-C; necessary for the timely completion of procedure and due to complexity of procedure. ? ?ANESTHESIA: supraclavicular block, MAC ? ?TOURNIQUET TIME: see anesthesia record. ? ?BLOOD LOSS: Minimal. ? ?COMPLICATIONS: None. ? ?PATHOLOGY: None. ? ?TIME OUT: A time out was performed before the procedure started. ? ?INDICATIONS FOR PROCEDURE: The patient presents today for the above mentioned procedure after failing extensive conservative measures.  The risks, benefits, and alternatives to surgery were discussed and the patient elected to proceed with surgery. ? ?DESCRIPTION OF PROCEDURE: A Wagner's incision was made. The superficial radial nerves were identified and protected. Radial artery was exposed and protected. We dissected sharply in between the extensor pollicis brevis and abductor pollicis longus tendon interval. Capsulotomy was performed. Capsular flaps were raised. Fluoroscopy was used to identify the borders of the trapezium.  Trapezium was exposed and removed.  Using fluoroscopic guidance, a pin was drilled through the radial base of the second metacarpal at approximately 45 degrees towards the ulnar cortex.  The pin was brought out the ulnar cortex.  The drill guide was then screwed over the pin into place.  The pin was then removed and the drill was inserted through the drill guide and brought out the ulnar cortex to the proper depth.  The anchor was then inserted to the proper depth and deployed on the ulnar cortex.  The drill guide was removed.  We then placed a pin on the radial base of the thumb metacarpal in line with the anatomic saddle of the metacarpal base.  This was overdrilled to the proper  depth.  The suture tapes from the anchor were then delivered into the base of the thumb metacarpal with the swivel lock.  The proper tension was found prior to securing the swivel lock.  Radiographic Shenton's line was restored.   I was happy with the range of motion of the thumb.  Final x-rays were taken. The wound was irrigated. The capsular tissue was repaired using 3-0 Vicryl sutures. Then tourniquet was deflated. Hemostasis achieved. Wounds were irrigated and closed using 4-0 nylon sutures. Sterile dressing applied, hand immobilized in a thumb spica splint. The patient then was transferred to the recovery room in stable condition after all counts were correct. ? ?POSTOPERATIVE PLAN: Return in two weeks for suture removal and application of a thumb spica cast. Four weeks after surgery cast will be removed and switch to a thumb spica brace and start hand therapy. ? ?

## 2021-09-03 ENCOUNTER — Encounter (HOSPITAL_BASED_OUTPATIENT_CLINIC_OR_DEPARTMENT_OTHER): Payer: Self-pay | Admitting: Orthopaedic Surgery

## 2021-09-09 NOTE — Progress Notes (Signed)
Remote pacemaker transmission.   

## 2021-09-11 ENCOUNTER — Encounter: Payer: Medicare Other | Admitting: Physician Assistant

## 2021-09-15 ENCOUNTER — Ambulatory Visit (INDEPENDENT_AMBULATORY_CARE_PROVIDER_SITE_OTHER): Payer: Medicare Other | Admitting: Podiatry

## 2021-09-15 ENCOUNTER — Encounter: Payer: Self-pay | Admitting: Podiatry

## 2021-09-15 ENCOUNTER — Ambulatory Visit (INDEPENDENT_AMBULATORY_CARE_PROVIDER_SITE_OTHER): Payer: Medicare Other

## 2021-09-15 ENCOUNTER — Ambulatory Visit (INDEPENDENT_AMBULATORY_CARE_PROVIDER_SITE_OTHER): Payer: Medicare Other | Admitting: Orthopaedic Surgery

## 2021-09-15 DIAGNOSIS — D2371 Other benign neoplasm of skin of right lower limb, including hip: Secondary | ICD-10-CM

## 2021-09-15 DIAGNOSIS — D2372 Other benign neoplasm of skin of left lower limb, including hip: Secondary | ICD-10-CM

## 2021-09-15 DIAGNOSIS — M7752 Other enthesopathy of left foot: Secondary | ICD-10-CM | POA: Diagnosis not present

## 2021-09-15 DIAGNOSIS — M1811 Unilateral primary osteoarthritis of first carpometacarpal joint, right hand: Secondary | ICD-10-CM

## 2021-09-15 MED ORDER — DEXAMETHASONE SODIUM PHOSPHATE 120 MG/30ML IJ SOLN
2.0000 mg | Freq: Once | INTRAMUSCULAR | Status: AC
  Administered 2021-09-15: 2 mg via INTRA_ARTICULAR

## 2021-09-15 NOTE — Progress Notes (Signed)
He presents today chief complaint of painful area beneath the fifth metatarsal of the left foot. ? ?Objective: Pulses are palpable palpable bursa but beneath the fifth metatarsal base left foot.  Multiple benign skin lesions or porokeratotic lesions. ? ?Assessment: Bursitis of fifth met base left.  Capsulitis.  Porokeratosis. ? ?Plan: Discussed etiology pathology and surgical therapies this point time injected with 2 mg of dexamethasone local anesthetic and debrided all benign skin lesions. ?

## 2021-09-15 NOTE — Progress Notes (Signed)
? ?Post-Op Visit Note ?  ?Patient: Lee [name]           ?Date of Birth: [birth year]           ?MRN: [id number] ?Visit Date: 09/15/2021 ?PCP: Susy Frizzle, MD ? ? ?Assessment & Plan: ? ?Chief Complaint:  ?Chief Complaint  ?Patient presents with  ? Right Thumb - Routine Post Op  ? ?Visit Diagnoses:  ?1. Primary osteoarthritis of first carpometacarpal joint of right hand   ? ? ?Plan: Mr. Euceda is 2 weeks status post right hand [facility] arthroplasty.  He is doing well overall has no real complaints.  Takes Tylenol occasionally for pain. ? ?Examination of the right hand shows healed surgical incision.  Mild bruising.  Normal capillary refill.  Expected range of motion of the thumb.  No neurovascular compromise. ? ?Mr. Sharp is doing well for his 2-week postop visit.  We remove the sutures in place Steri-Strips.  We placed him into a thumb spica cast which she will keep for the next 2 weeks.  Follow-up in 2 weeks for cast removal and initiation of hand therapy. ? ?Follow-Up Instructions: Return in about 2 weeks (around 09/29/2021).  ? ?Orders:  ?Orders Placed This Encounter  ?Procedures  ? XR Hand Complete Right  ? ?No orders of the defined types were placed in this encounter. ? ? ?Imaging: ?XR Hand Complete Right ? ?Result Date: 09/15/2021 ?Status post [facility] arthroplasty with trapeziectomy.  Expected findings.  No complications noted.  ? ?PMFS History: ?Patient Active Problem List  ? Diagnosis Date Noted  ? Primary osteoarthritis of first carpometacarpal joint of right hand 09/02/2021  ? Pain in right knee 02/12/2020  ? Second degree Mobitz II AV block 04/30/2019  ? Syncope and collapse 10/10/2018  ? Heart block AV complete ([place]) 10/10/2018  ? Symptomatic bradycardia 10/10/2018  ? Syncope, vasovagal 09/20/2018  ? Neck mass 10/14/2016  ? Depression 05/20/2016  ? Hypertensive heart disease   ? Paroxysmal atrial fibrillation (HCC)   ? Hyperlipidemia LDL goal <70 03/27/2014  ? Premature ventricular contraction  03/04/2014  ? Chest pain with high risk of acute coronary syndrome 06/28/2013  ? Morbid obesity ([place]) 06/28/2013  ? OSA (obstructive sleep apnea) 12/18/2012  ? Sleepiness 11/06/2012  ? Fatigue 11/06/2012  ? PALPITATIONS 06/02/2009  ? DYSLIPIDEMIA 09/05/2008  ? Essential hypertension 09/05/2008  ? CAD S/P percutaneous coronary angioplasty 09/05/2008  ? RBBB 09/05/2008  ? SUPRAVENTRICULAR TACHYCARDIA 09/05/2008  ? ALLERGIC RHINITIS 09/05/2008  ? GERD 09/05/2008  ? BACK PAIN 09/05/2008  ? ARRHYTHMIA, HX OF 09/05/2008  ? MIGRAINES, HX OF 09/05/2008  ? Dyslipidemia 09/05/2008  ? ?Past Medical History:  ?Diagnosis Date  ? Allergic rhinitis   ? chronic sinusitis  ? Allergy   ? Arthritis   ? CAD (coronary artery disease)   ? a. 2009 PCI/DES to mLAD; b. 05/2010 s/p DES RCA and LAD.; c. 05/2013 Cath: LAD mild ISR, LCX nl, RCA 40p, patent stents.  ? Cataract   ? has had lasik surg  ? COPD (chronic obstructive pulmonary disease) ([place])   ? Depression   ? Diabetes mellitus type 2, controlled, without complications ([place])   ? Diabetes mellitus without complication ([place])   ? Phreesia 08/22/2020  ? Diverticulosis   ? DJD (degenerative joint disease)   ? Esophagitis 1991  ? grade 1  ? GERD (gastroesophageal reflux disease)   ? Hiatal hernia  ? Hemorrhoids   ? Hyperlipidemia   ? Hypertension   ? Hypertensive heart  disease   ? Iron deficiency anemia   ? Migraines   ? Myocardial infarction [facility])   ? Paroxysmal atrial fibrillation (HCC)   ? a. s/p afib ablation 10/22/08 (J. Allred).  ? PVC's (premature ventricular contractions)   ? Sleep apnea   ? Questionalble: RDI during the total sleep time 6h 28 mins was 3.55/hr during REM sleep was at 5.71/hr. Supine AHI was 5.93/hr.  ? Syncope 08/2018  ?  ?Family History  ?Problem Relation Age of Onset  ? Heart disease Father   ?     and sister  ? Diabetes Father   ? Hearing loss Father   ? Colon cancer Mother   ?     mets from uterine  ? Uterine cancer Mother   ? Heart disease Sister   ?  ?Past  Surgical History:  ?Procedure Laterality Date  ? ABDOMINAL HYSTERECTOMY    ? CARDIAC CATHETERIZATION  05/31/2010  ? The proximal LAD was then predilated with 2.0 x 12 trek. this was then stented with a 2.5 x 16 promus Element drug-eluting stent at 14 atmosphere and prostdilated with 2.75 x 12 [place] trek at 16 atmospheres(2.8 mm) resulting the reduction of 80% proximal LAD stenosis to 0% residual with excellent flow.  ? CARDIAC CATHETERIZATION  06/05/2010  ? Successful percutaneous coronary intervention to the right coronary artery with percutaneous transluminal coronary angioplasty/stenting and insertion 3.0 x 16 mm Promus DES post dilated to 3.35 mm with stenosis being reduced to 0%  ? CARDIAC CATHETERIZATION  02/29/2008  ? Post dilatation was performed using a 2.75 x 9 [place] sprinter, 10 atmospheres for 40 seconds and then 9 atmosphere for 35 sec. This resulted in the 80% area of narrowing pre-intervention, now appearing to normal. There was no edvidence of the dissection or thrombus and there was TIMI III flow pre and post.  ? CARDIAC CATHETERIZATION  02/28/2006  ? CORONARY ANGIOPLASTY WITH STENT PLACEMENT    ? 3 stents/ 4 caths  ? EYE SURGERY    ? FINGER ARTHROPLASTY Right 09/02/2021  ? Procedure: RIGHT THUMB CARPOMETACARPAL ARTHROPLASTY;  Surgeon: Leandrew Koyanagi, MD;  Location: [place];  Service: Orthopedics;  Laterality: Right;  block in preop  ? KNEE ARTHROSCOPY    ? right  ? LASIK    ? LEFT HEART CATHETERIZATION WITH CORONARY ANGIOGRAM N/A 06/29/2013  ? Procedure: LEFT HEART CATHETERIZATION WITH CORONARY ANGIOGRAM;  Surgeon: Leonie Man, MD;  Location[facility] CATH LAB;  Service: Cardiovascular;  Laterality: N/A;  ? NASAL SINUS SURGERY  06/01/1999  ? NECK SURGERY    ? Cervical fusion  ? PACEMAKER IMPLANT N/A 05/01/2019  ? Procedure: PACEMAKER IMPLANT;  Surgeon: [name], MD;  Location: [place] CV LAB;  Service: Cardiovascular;  Laterality: N/A;  ? RADIOFREQUENCY ABLATION  09/28/2008  ?  atrial fibrillation  ? ROTATOR CUFF REPAIR    ? left  ? SHOULDER OPEN ROTATOR CUFF REPAIR    ? right  ? SPINE SURGERY    ? WRIST ARTHROCENTESIS    ? left  ? ?Social History  ? ?Occupational History  ? Occupation: retired  ?Tobacco Use  ? Smoking status: Former  ?  Packs/day: 2.00  ?  Years: 25.00  ?  Pack years: 50.00  ?  Types: Cigarettes, Cigars  ?  Quit date: 04/22/1988  ?  Years since quitting: 33.4  ? Smokeless tobacco: Never  ?Vaping Use  ? Vaping Use: Never used  ?Substance and Sexual Activity  ? Alcohol  use: Not Currently  ?  Comment: once every 6-7 months  ? Drug use: No  ? Sexual activity: Not Currently  ?  Birth control/protection: Surgical  ? ? ? ?

## 2021-09-17 ENCOUNTER — Other Ambulatory Visit: Payer: Medicare Other

## 2021-09-17 DIAGNOSIS — E118 Type 2 diabetes mellitus with unspecified complications: Secondary | ICD-10-CM

## 2021-09-18 LAB — HEMOGLOBIN A1C
Hgb A1c MFr Bld: 6.3 % of total Hgb — ABNORMAL HIGH (ref <5.7)
Mean Plasma Glucose: 134 mg/dL
eAG (mmol/L): 7.4 mmol/L

## 2021-09-21 ENCOUNTER — Ambulatory Visit (INDEPENDENT_AMBULATORY_CARE_PROVIDER_SITE_OTHER): Payer: Medicare Other | Admitting: Family Medicine

## 2021-09-21 ENCOUNTER — Other Ambulatory Visit: Payer: Self-pay | Admitting: Family Medicine

## 2021-09-21 ENCOUNTER — Encounter: Payer: Self-pay | Admitting: Family Medicine

## 2021-09-21 VITALS — BP 130/82 | HR 79 | Temp 97.7°F | Ht 67.0 in | Wt 224.4 lb

## 2021-09-21 DIAGNOSIS — Z9861 Coronary angioplasty status: Secondary | ICD-10-CM | POA: Diagnosis not present

## 2021-09-21 DIAGNOSIS — I10 Essential (primary) hypertension: Secondary | ICD-10-CM

## 2021-09-21 DIAGNOSIS — I2510 Atherosclerotic heart disease of native coronary artery without angina pectoris: Secondary | ICD-10-CM | POA: Diagnosis not present

## 2021-09-21 DIAGNOSIS — E118 Type 2 diabetes mellitus with unspecified complications: Secondary | ICD-10-CM | POA: Diagnosis not present

## 2021-09-21 MED ORDER — TRULICITY 0.75 MG/0.5ML ~~LOC~~ SOAJ: 0.7500 mg | SUBCUTANEOUS | 1 refills | Status: DC

## 2021-09-21 NOTE — Progress Notes (Signed)
? ?Subjective:  ? ? Patient ID: Lee [name], male    DOB: 09/24/1950, 71 y.o.   MRN: [id number] ? ?HPI  ?Wt Readings from Last 3 Encounters:  ?09/21/21 224 lb 6.4 oz (101.8 kg)  ?09/02/21 224 lb 6.9 oz (101.8 kg)  ?08/27/21 227 lb 9.6 oz (103.2 kg)  ? ?Patient is a very pleasant 71 year old Caucasian gentleman with a history of diabetes and heart disease.  He denies any angina or shortness of breath or dyspnea on exertion.  He is exercising now on a daily basis with his wife and has lost more than 20 pounds.  He is here today to recheck his A1c.  His A1c is dropped to 6.3.  He had to stop metformin due to diarrhea but he is continuing to take the [place].  He denies any dysuria or urgency or frequency.  He has a history of aortic sclerosis on echocardiogram in 2020.  However he has a worsening murmur heard best today over the aortic valve.  He denies any syncope or near syncope or shortness of breath or dyspnea on exertion ? ?Past Medical History:  ?Diagnosis Date  ? Allergic rhinitis   ? chronic sinusitis  ? Allergy   ? Arthritis   ? CAD (coronary artery disease)   ? a. 2009 PCI/DES to mLAD; b. 05/2010 s/p DES RCA and LAD.; c. 05/2013 Cath: LAD mild ISR, LCX nl, RCA 40p, patent stents.  ? Cataract   ? has had lasik surg  ? COPD (chronic obstructive pulmonary disease) ([place])   ? Depression   ? Diabetes mellitus type 2, controlled, without complications ([place])   ? Diabetes mellitus without complication ([place])   ? Phreesia 08/22/2020  ? Diverticulosis   ? DJD (degenerative joint disease)   ? Esophagitis 1991  ? grade 1  ? GERD (gastroesophageal reflux disease)   ? Hiatal hernia  ? Hemorrhoids   ? Hyperlipidemia   ? Hypertension   ? Hypertensive heart disease   ? Iron deficiency anemia   ? Migraines   ? Myocardial infarction [facility])   ? Paroxysmal atrial fibrillation (HCC)   ? a. s/p afib ablation 10/22/08 (J. Allred).  ? PVC's (premature ventricular contractions)   ? Sleep apnea   ? Questionalble: RDI during the total  sleep time 6h 28 mins was 3.55/hr during REM sleep was at 5.71/hr. Supine AHI was 5.93/hr.  ? Syncope 08/2018  ? ?Past Surgical History:  ?Procedure Laterality Date  ? ABDOMINAL HYSTERECTOMY    ? CARDIAC CATHETERIZATION  05/31/2010  ? The proximal LAD was then predilated with 2.0 x 12 trek. this was then stented with a 2.5 x 16 promus Element drug-eluting stent at 14 atmosphere and prostdilated with 2.75 x 12 [place] trek at 16 atmospheres(2.8 mm) resulting the reduction of 80% proximal LAD stenosis to 0% residual with excellent flow.  ? CARDIAC CATHETERIZATION  06/05/2010  ? Successful percutaneous coronary intervention to the right coronary artery with percutaneous transluminal coronary angioplasty/stenting and insertion 3.0 x 16 mm Promus DES post dilated to 3.35 mm with stenosis being reduced to 0%  ? CARDIAC CATHETERIZATION  02/29/2008  ? Post dilatation was performed using a 2.75 x 9 [place] sprinter, 10 atmospheres for 40 seconds and then 9 atmosphere for 35 sec. This resulted in the 80% area of narrowing pre-intervention, now appearing to normal. There was no edvidence of the dissection or thrombus and there was TIMI III flow pre and post.  ? CARDIAC CATHETERIZATION  02/28/2006  ?  CORONARY ANGIOPLASTY WITH STENT PLACEMENT    ? 3 stents/ 4 caths  ? EYE SURGERY    ? FINGER ARTHROPLASTY Right 09/02/2021  ? Procedure: RIGHT THUMB CARPOMETACARPAL ARTHROPLASTY;  Surgeon: Leandrew Koyanagi, MD;  Location: [place];  Service: Orthopedics;  Laterality: Right;  block in preop  ? KNEE ARTHROSCOPY    ? right  ? LASIK    ? LEFT HEART CATHETERIZATION WITH CORONARY ANGIOGRAM N/A 06/29/2013  ? Procedure: LEFT HEART CATHETERIZATION WITH CORONARY ANGIOGRAM;  Surgeon: Leonie Man, MD;  [facility] C[facility] LAB;  Service: Cardiovascular;  Laterality: N/A;  ? NASAL SINUS SURGERY  06/01/1999  ? NECK SURGERY    ? Cervical fusion  ? PACEMAKER IMPLANT N/A 05/01/2019  ? Procedure: PACEMAKER IMPLANT;  Surgeon: [name], MD;   Location: [place] CV LAB;  Service: Cardiovascular;  Laterality: N/A;  ? RADIOFREQUENCY ABLATION  09/28/2008  ? atrial fibrillation  ? ROTATOR CUFF REPAIR    ? left  ? SHOULDER OPEN ROTATOR CUFF REPAIR    ? right  ? SPINE SURGERY    ? WRIST ARTHROCENTESIS    ? left  ? ?Current Outpatient Medications on File Prior to Visit  ?Medication Sig Dispense Refill  ? Accu-Chek FastClix Lancets MISC USE TO CHECK BLOOD SUGAR TWICE DAILY 102 each 3  ? ACCU-CHEK GUIDE test strip USE TO CHECK BLOOD SUGAR 2 TIMES DAILY 100 strip 1  ? acetaminophen (TYLENOL) 325 MG tablet Take 1-2 tablets (325-650 mg total) by mouth every 4 (four) hours as needed for mild pain.    ? amLODipine (NORVASC) 10 MG tablet TAKE 1 TABLET(10 MG) BY MOUTH DAILY 90 tablet 3  ? amLODipine (NORVASC) 10 MG tablet Take by mouth. (Patient not taking: Reported on 08/27/2021)    ? aspirin EC 81 MG tablet Take 81 mg by mouth at bedtime.    ? Blood Glucose Monitoring Suppl (ACCU-CHEK AVIVA PLUS) w/Device KIT Check BS bid E11.9 1 kit 1  ? cholecalciferol (VITAMIN D) 1000 UNITS tablet Take 1,000 Units by mouth at bedtime.     ? citalopram (CELEXA) 40 MG tablet TAKE 1 TABLET(40 MG) BY MOUTH DAILY 90 tablet 1  ? Coenzyme Q10 (CO Q 10) 100 MG CAPS Take 100 mg by mouth daily with breakfast.     ? Erenumab-aooe (AIMOVIG) 140 MG/ML SOAJ Inject 140 mg into the skin every 30 (thirty) days. On or about the 1st of each month    ? fexofenadine (ALLEGRA) 180 MG tablet Take 180 mg by mouth daily with breakfast.     ? fluticasone (FLONASE) 50 MCG/ACT nasal spray INSTILL 2 SPRAYS IN THE  NOSE AT BEDTIME 48 g 3  ? furosemide (LASIX) 40 MG tablet TAKE 1 TABLET BY MOUTH  DAILY 90 tablet 3  ? JARDIANCE 25 MG TABS tablet TAKE 1 TABLET BY MOUTH DAILY BEFORE BREAKFAST 30 tablet 5  ? Lancets Misc. (ACCU-CHEK FASTCLIX LANCET) KIT USE AS DIRECTED TO CHECK BLOOD SUGAR TWICE DAILY. DX: E11.9 1 kit 1  ? metFORMIN (GLUCOPHAGE-XR) 500 MG 24 hr tablet Take by mouth.    ? Multiple Vitamin  (MULTIVITAMIN WITH MINERALS) TABS tablet Take 1 tablet by mouth daily with breakfast.    ? nitroGLYCERIN (NITROSTAT) 0.4 MG SL tablet PLACE ONE TABLET UNDER THE TONGUE EVERY 5 MINUTES AS NEEDED FOR CHEST PAIN 25 tablet 3  ? ondansetron (ZOFRAN) 4 MG tablet Take 1-2 tablets (4-8 mg total) by mouth every 8 (eight) hours as needed for nausea  or vomiting. 20 tablet 0  ? oxyCODONE-acetaminophen (PERCOCET) 10-325 MG tablet Take 1 tablet by mouth every 8 (eight) hours as needed for pain. 21 tablet 0  ? oxyCODONE-acetaminophen (PERCOCET) 5-325 MG tablet Take 1-2 tablets by mouth every 8 (eight) hours as needed for severe pain. 30 tablet 0  ? pantoprazole (PROTONIX) 40 MG tablet TAKE 1 TABLET BY MOUTH  DAILY 90 tablet 3  ? Polyvinyl Alcohol-Povidone (REFRESH OP) Place 1 drop into both eyes at bedtime as needed (dry eyes).    ? potassium chloride (MICRO-K) 10 MEQ CR capsule TAKE 1 CAPSULE BY MOUTH  DAILY 90 capsule 3  ? Rimegepant Sulfate (NURTEC PO) Take by mouth.    ? rosuvastatin (CRESTOR) 40 MG tablet TAKE 1 TABLET BY MOUTH  DAILY 90 tablet 3  ? Study - CAPTIVA - aspirin 81 mg tablet (PI-Sethi) Chew by mouth.    ? telmisartan (MICARDIS) 40 MG tablet TAKE 1 TABLET BY MOUTH  DAILY 90 tablet 3  ? ?No current facility-administered medications on file prior to visit.  ? ?Allergies  ?Allergen Reactions  ? Ibuprofen Swelling  ?  Whole body swelled  ? Other Shortness Of Breath  ?  BETA BLOCKERS  ? Topamax [Topiramate] Other (See Comments)  ?  Pt states it made his tongue feel "scalded" and he couldn't eat   ? Toprol Xl [Metoprolol Succinate] Other (See Comments)  ?  Personality change  ? Metoprolol-Hctz Er Other (See Comments)  ?  Indigestion,mouth raw  ? ?Social History  ? ?Socioeconomic History  ? Marital status: Married  ?  Spouse name: Katharine Look  ? Number of children: 2  ? Years of education: Not on file  ? Highest education level: Not on file  ?Occupational History  ? Occupation: retired  ?Tobacco Use  ? Smoking status: Former   ?  Packs/day: 2.00  ?  Years: 25.00  ?  Pack years: 50.00  ?  Types: Cigarettes, Cigars  ?  Quit date: 04/22/1988  ?  Years since quitting: 33.4  ? Smokeless tobacco: Never  ?Vaping Use  ? Vaping Use: Never used

## 2021-09-21 NOTE — Telephone Encounter (Signed)
Rx sent in per Dr. Dennard Schaumann ?

## 2021-09-23 ENCOUNTER — Ambulatory Visit (HOSPITAL_COMMUNITY)
Admission: RE | Admit: 2021-09-23 | Discharge: 2021-09-23 | Disposition: A | Discharge status: Home / Self Care | Payer: Medicare Other | Source: Ambulatory Visit | Attending: Family Medicine | Admitting: Family Medicine

## 2021-09-23 DIAGNOSIS — I082 Rheumatic disorders of both aortic and tricuspid valves: Secondary | ICD-10-CM | POA: Insufficient documentation

## 2021-09-23 DIAGNOSIS — I4891 Unspecified atrial fibrillation: Secondary | ICD-10-CM | POA: Diagnosis not present

## 2021-09-23 DIAGNOSIS — I10 Essential (primary) hypertension: Secondary | ICD-10-CM | POA: Diagnosis not present

## 2021-09-23 DIAGNOSIS — J449 Chronic obstructive pulmonary disease, unspecified: Secondary | ICD-10-CM | POA: Diagnosis not present

## 2021-09-23 DIAGNOSIS — I2510 Atherosclerotic heart disease of native coronary artery without angina pectoris: Secondary | ICD-10-CM | POA: Diagnosis not present

## 2021-09-23 DIAGNOSIS — Z950 Presence of cardiac pacemaker: Secondary | ICD-10-CM | POA: Insufficient documentation

## 2021-09-23 DIAGNOSIS — E785 Hyperlipidemia, unspecified: Secondary | ICD-10-CM | POA: Insufficient documentation

## 2021-09-23 LAB — ECHOCARDIOGRAM COMPLETE
AR max vel: 1.35 cm2
AV Area VTI: 1.25 cm2
AV Area mean vel: 1.18 cm2
AV Mean grad: 16 mmHg
AV Peak grad: 25.4 mmHg
Ao pk vel: 2.52 m/s
S' Lateral: 3.5 cm

## 2021-09-23 MED ORDER — PERFLUTREN LIPID MICROSPHERE
1.0000 mL | INTRAVENOUS | Status: AC | PRN
  Administered 2021-09-23: 3 mL via INTRAVENOUS
  Filled 2021-09-23: qty 10

## 2021-09-29 ENCOUNTER — Ambulatory Visit (INDEPENDENT_AMBULATORY_CARE_PROVIDER_SITE_OTHER): Payer: Medicare Other

## 2021-09-29 ENCOUNTER — Ambulatory Visit (INDEPENDENT_AMBULATORY_CARE_PROVIDER_SITE_OTHER): Payer: Medicare Other | Admitting: Orthopaedic Surgery

## 2021-09-29 ENCOUNTER — Encounter: Payer: Self-pay | Admitting: Orthopaedic Surgery

## 2021-09-29 DIAGNOSIS — M1811 Unilateral primary osteoarthritis of first carpometacarpal joint, right hand: Secondary | ICD-10-CM | POA: Diagnosis not present

## 2021-09-29 NOTE — Progress Notes (Signed)
? ?Post-Op Visit Note ?  ?Patient: Lee [name]           ?Date of Birth: [birth year]           ?MRN: [id number] ?Visit Date: 09/29/2021 ?PCP: Susy Frizzle, MD ? ? ?Assessment & Plan: ? ?Chief Complaint:  ?Chief Complaint  ?Patient presents with  ? Right Hand - Pain  ? ?Visit Diagnoses:  ?1. Primary osteoarthritis of first carpometacarpal joint of right hand   ? ? ?Plan: Patient is a pleasant 71 year old gentleman who comes in today 4 weeks status post right hand first [facility] arthroplasty 09/02/2021.  He has been doing well.  He takes an occasional Tylenol.  He has been compliant wearing his thumb spica cast.  Examination of his right hand reveals a fully healed surgical scar without complication.  Fingers are warm and well-perfused.  He is neurovascular intact distally.  At this point, we have provided him with a Velcro thumb spica splint.  Have also put an order for hand therapy where they will fit him for a thermoplastic splint.  He will follow-up with [place] in 4 weeks time for repeat evaluation.  Call with concerns or questions. ? ?Follow-Up Instructions: Return in about 4 weeks (around 10/27/2021).  ? ?Orders:  ?Orders Placed This Encounter  ?Procedures  ? XR Hand Complete Right  ? Ambulatory referral to Occupational Therapy  ? ?No orders of the defined types were placed in this encounter. ? ? ?Imaging: ?No results found. ? ?PMFS History: ?Patient Active Problem List  ? Diagnosis Date Noted  ? Primary osteoarthritis of first carpometacarpal joint of right hand 09/02/2021  ? Pain in right knee 02/12/2020  ? Second degree Mobitz II AV block 04/30/2019  ? Syncope and collapse 10/10/2018  ? Heart block AV complete ([place]) 10/10/2018  ? Symptomatic bradycardia 10/10/2018  ? Syncope, vasovagal 09/20/2018  ? Neck mass 10/14/2016  ? Depression 05/20/2016  ? Hypertensive heart disease   ? Paroxysmal atrial fibrillation (HCC)   ? Hyperlipidemia LDL goal <70 03/27/2014  ? Premature ventricular contraction 03/04/2014  ? Chest  pain with high risk of acute coronary syndrome 06/28/2013  ? Morbid obesity ([place]) 06/28/2013  ? OSA (obstructive sleep apnea) 12/18/2012  ? Sleepiness 11/06/2012  ? Fatigue 11/06/2012  ? PALPITATIONS 06/02/2009  ? DYSLIPIDEMIA 09/05/2008  ? Essential hypertension 09/05/2008  ? CAD S/P percutaneous coronary angioplasty 09/05/2008  ? RBBB 09/05/2008  ? SUPRAVENTRICULAR TACHYCARDIA 09/05/2008  ? ALLERGIC RHINITIS 09/05/2008  ? GERD 09/05/2008  ? BACK PAIN 09/05/2008  ? ARRHYTHMIA, HX OF 09/05/2008  ? MIGRAINES, HX OF 09/05/2008  ? Dyslipidemia 09/05/2008  ? ?Past Medical History:  ?Diagnosis Date  ? Allergic rhinitis   ? chronic sinusitis  ? Allergy   ? Arthritis   ? CAD (coronary artery disease)   ? a. 2009 PCI/DES to mLAD; b. 05/2010 s/p DES RCA and LAD.; c. 05/2013 Cath: LAD mild ISR, LCX nl, RCA 40p, patent stents.  ? Cataract   ? has had lasik surg  ? COPD (chronic obstructive pulmonary disease) ([place])   ? Depression   ? Diabetes mellitus type 2, controlled, without complications ([place])   ? Diabetes mellitus without complication ([place])   ? Phreesia 08/22/2020  ? Diverticulosis   ? DJD (degenerative joint disease)   ? Esophagitis 1991  ? grade 1  ? GERD (gastroesophageal reflux disease)   ? Hiatal hernia  ? Hemorrhoids   ? Hyperlipidemia   ? Hypertension   ? Hypertensive heart disease   ?  Iron deficiency anemia   ? Migraines   ? Myocardial infarction [facility])   ? Paroxysmal atrial fibrillation (HCC)   ? a. s/p afib ablation 10/22/08 ([facility]. [facility]).  ? PVC's (premature ventricular contractions)   ? Sleep apnea   ? Questionalble: RDI during the total sleep time 6h 28 mins was 3.55/hr during REM sleep was at 5.71/hr. Supine AHI was 5.93/hr.  ? Syncope 08/2018  ?  ?Family History  ?Problem Relation Age of Onset  ? Heart disease Father   ?     and sister  ? Diabetes Father   ? Hearing loss Father   ? Colon cancer Mother   ?     mets from uterine  ? Uterine cancer Mother   ? Heart disease Sister   ?  ?Past Surgical History:   ?Procedure Laterality Date  ? ABDOMINAL HYSTERECTOMY    ? CARDIAC CATHETERIZATION  05/31/2010  ? The proximal LAD was then predilated with 2.0 x 12 trek. this was then stented with a 2.5 x 16 promus Element drug-eluting stent at 14 atmosphere and prostdilated with 2.75 x 12 [place] trek at 16 atmospheres(2.8 mm) resulting the reduction of 80% proximal LAD stenosis to 0% residual with excellent flow.  ? CARDIAC CATHETERIZATION  06/05/2010  ? Successful percutaneous coronary intervention to the right coronary artery with percutaneous transluminal coronary angioplasty/stenting and insertion 3.0 x 16 mm Promus DES post dilated to 3.35 mm with stenosis being reduced to 0%  ? CARDIAC CATHETERIZATION  02/29/2008  ? Post dilatation was performed using a 2.75 x 9 [place] sprinter, 10 atmospheres for 40 seconds and then 9 atmosphere for 35 sec. This resulted in the 80% area of narrowing pre-intervention, now appearing to normal. There was no edvidence of the dissection or thrombus and there was TIMI III flow pre and post.  ? CARDIAC CATHETERIZATION  02/28/2006  ? CORONARY ANGIOPLASTY WITH STENT PLACEMENT    ? 3 stents/ 4 caths  ? EYE SURGERY    ? FINGER ARTHROPLASTY Right 09/02/2021  ? Procedure: RIGHT THUMB CARPOMETACARPAL ARTHROPLASTY;  Surgeon: Leandrew Koyanagi, MD;  Location: [place];  Service: Orthopedics;  Laterality: Right;  block in preop  ? KNEE ARTHROSCOPY    ? right  ? LASIK    ? LEFT HEART CATHETERIZATION WITH CORONARY ANGIOGRAM N/A 06/29/2013  ? Procedure: LEFT HEART CATHETERIZATION WITH CORONARY ANGIOGRAM;  Surgeon: Leonie Man, MD;  Location: [facility] CATH LAB;  Service: Cardiovascular;  Laterality: N/A;  ? NASAL SINUS SURGERY  06/01/1999  ? NECK SURGERY    ? Cervical fusion  ? PACEMAKER IMPLANT N/A 05/01/2019  ? Procedure: PACEMAKER IMPLANT;  Surgeon: [name], MD;  Location: [place] CV LAB;  Service: Cardiovascular;  Laterality: N/A;  ? RADIOFREQUENCY ABLATION  09/28/2008  ? atrial fibrillation  ?  ROTATOR CUFF REPAIR    ? left  ? SHOULDER OPEN ROTATOR CUFF REPAIR    ? right  ? SPINE SURGERY    ? WRIST ARTHROCENTESIS    ? left  ? ?Social History  ? ?Occupational History  ? Occupation: retired  ?Tobacco Use  ? Smoking status: Former  ?  Packs/day: 2.00  ?  Years: 25.00  ?  Pack years: 50.00  ?  Types: Cigarettes, Cigars  ?  Quit date: 04/22/1988  ?  Years since quitting: 33.4  ? Smokeless tobacco: Never  ?Vaping Use  ? Vaping Use: Never used  ?Substance and Sexual Activity  ? Alcohol use: Not Currently  ?  Comment: once every 6-7 months  ? Drug use: No  ? Sexual activity: Not Currently  ?  Birth control/protection: Surgical  ? ? ? ?

## 2021-09-30 ENCOUNTER — Other Ambulatory Visit: Payer: Self-pay | Admitting: Family Medicine

## 2021-09-30 NOTE — Telephone Encounter (Signed)
Requested Prescriptions  ?Pending Prescriptions Disp Refills  ?? ACCU-CHEK GUIDE test strip [Pharmacy Med Name: ACCU-CHEK GUIDE TEST STRIPS 100S] 100 strip 1  ?  Sig: USE TO CHECK BLOOD SUGAR LEVELS TWICE DAILY.  ?  ? Endocrinology: Diabetes - Testing Supplies Passed - 09/30/2021 10:13 AM  ?  ?  Passed - Valid encounter within last 12 months  ?  Recent Outpatient Visits   ?      ? 1 week ago Coronary artery disease involving native coronary artery of native heart without angina pectoris  ? [facility] Pick[place], Cammie Mcgee, MD  ? 3 months ago Type 2 diabetes mellitus with complication, without long-term current use of insulin ([place])  ? [facility] Pickard, Cammie Mcgee, MD  ? 10 months ago Wrist pain, chronic, right  ? [facility] Pickard, Cammie Mcgee, MD  ? 1 year ago General medical exam  ? [facility] Susy Frizzle, MD  ? 1 year ago Type 2 diabetes mellitus with complication, without long-term current use of insulin ([place])  ? [facility] Pickard, Cammie Mcgee, MD  ?  ?  ? ?  ?  ?  ? ? ?

## 2021-10-03 ENCOUNTER — Other Ambulatory Visit: Payer: Self-pay | Admitting: Family Medicine

## 2021-10-05 NOTE — Telephone Encounter (Signed)
Requested Prescriptions  ?Pending Prescriptions Disp Refills  ?? citalopram (CELEXA) 40 MG tablet [Pharmacy Med Name: CITALOPRAM 40MG TABLETS] 90 tablet 1  ?  Sig: TAKE 1 TABLET(40 MG) BY MOUTH DAILY  ?  ? Psychiatry:  Antidepressants - SSRI Passed - 10/03/2021  7:04 AM  ?  ?  Passed - Completed PHQ-2 or PHQ-9 in the last 360 days  ?  ?  Passed - Valid encounter within last 6 months  ?  Recent Outpatient Visits   ?      ? 2 weeks ago Coronary artery disease involving native coronary artery of native heart without angina pectoris  ? [facility] Pickard, Cammie Mcgee, MD  ? 3 months ago Type 2 diabetes mellitus with complication, without long-term current use of insulin ([place])  ? [facility] Pickard, Cammie Mcgee, MD  ? 10 months ago Wrist pain, chronic, right  ? [facility] Pickard, Cammie Mcgee, MD  ? 1 year ago General medical exam  ? [facility] Susy Frizzle, MD  ? 1 year ago Type 2 diabetes mellitus with complication, without long-term current use of insulin ([place])  ? [facility] Pickard, Cammie Mcgee, MD  ?  ?  ? ?  ?  ?  ?? JARDIANCE 25 MG TABS tablet [Pharmacy Med Name: JARDIANCE 25MG TABLETS] 30 tablet 5  ?  Sig: TAKE 1 TABLET BY MOUTH DAILY BEFORE BREAKFAST  ?  ? Endocrinology:  Diabetes - SGLT2 Inhibitors Passed - 10/03/2021  7:04 AM  ?  ?  Passed - Cr in normal range and within 360 days  ?  Creat  ?Date Value Ref Range Status  ?06/26/2021 0.83 0.70 - 1.28 mg/dL Final  ? ?Creatinine, Ser  ?Date Value Ref Range Status  ?08/25/2021 0.82 0.61 - 1.24 mg/dL Final  ?   ?  ?  Passed - HBA1C is between 0 and 7.9 and within 180 days  ?  Hgb A1c MFr Bld  ?Date Value Ref Range Status  ?09/17/2021 6.3 (H) <5.7 % of total Hgb Final  ?  Comment:  ?  For someone without known diabetes, a hemoglobin  ?A1c value between 5.7% and 6.4% is consistent with ?prediabetes and should be confirmed with a  ?follow-up test. ?. ?For someone with known diabetes, a value  <7% ?indicates that their diabetes is well controlled. A1c ?targets should be individualized based on duration of ?diabetes, age, comorbid conditions, and other ?considerations. ?. ?This assay result is consistent with an increased risk ?of diabetes. ?. ?Currently, no consensus exists regarding use of ?hemoglobin A1c for diagnosis of diabetes for children. ?. ?  ?   ?  ?  Passed - eGFR in normal range and within 360 days  ?  GFR, Est African American  ?Date Value Ref Range Status  ?08/18/2020 102 > OR = 60 mL/min/1.54m Final  ? ?GFR, Est Non African American  ?Date Value Ref Range Status  ?08/18/2020 88 > OR = 60 mL/min/1.763mFinal  ? ?GFR, Estimated  ?Date Value Ref Range Status  ?08/25/2021 >60 >60 mL/min Final  ?  Comment:  ?  (NOTE) ?Calculated using the CKD-EPI Creatinine Equation (2021) ?  ? ?eGFR  ?Date Value Ref Range Status  ?06/26/2021 94 > OR = 60 mL/min/1.7361minal  ?  Comment:  ?  The eGFR is based on the CKD-EPI 2021 equation. To calculate  ?the new eGFR from a previous Creatinine or Cystatin C ?result,  go to https://www.kidney.org/professionals/ ?kdoqi/gfr%5Fcalculator ?  ?   ?  ?  Passed - Valid encounter within last 6 months  ?  Recent Outpatient Visits   ?      ? 2 weeks ago Coronary artery disease involving native coronary artery of native heart without angina pectoris  ? [facility] Pickard, Cammie Mcgee, MD  ? 3 months ago Type 2 diabetes mellitus with complication, without long-term current use of insulin ([place])  ? [facility] Pickard, Cammie Mcgee, MD  ? 10 months ago Wrist pain, chronic, right  ? [facility] Pickard, Cammie Mcgee, MD  ? 1 year ago General medical exam  ? [facility] Susy Frizzle, MD  ? 1 year ago Type 2 diabetes mellitus with complication, without long-term current use of insulin ([place])  ? [facility] Pickard, Cammie Mcgee, MD  ?  ?  ? ?  ?  ?  ?? amLODipine (NORVASC) 10 MG tablet [Pharmacy Med Name:  AMLODIPINE BESYLATE 10MG TABLETS] 90 tablet 3  ?  Sig: TAKE 1 TABLET(10 MG) BY MOUTH DAILY  ?  ? Cardiovascular: Calcium Channel Blockers 2 Passed - 10/03/2021  7:04 AM  ?  ?  Passed - Last BP in normal range  ?  BP Readings from Last 1 Encounters:  ?09/21/21 130/82  ?   ?  ?  Passed - Last Heart Rate in normal range  ?  Pulse Readings from Last 1 Encounters:  ?09/21/21 79  ?   ?  ?  Passed - Valid encounter within last 6 months  ?  Recent Outpatient Visits   ?      ? 2 weeks ago Coronary artery disease involving native coronary artery of native heart without angina pectoris  ? [facility] Pickard, Cammie Mcgee, MD  ? 3 months ago Type 2 diabetes mellitus with complication, without long-term current use of insulin ([place])  ? [facility] Pickard, Cammie Mcgee, MD  ? 10 months ago Wrist pain, chronic, right  ? [facility] Pickard, Cammie Mcgee, MD  ? 1 year ago General medical exam  ? [facility] Susy Frizzle, MD  ? 1 year ago Type 2 diabetes mellitus with complication, without long-term current use of insulin ([place])  ? [facility] Pickard, Cammie Mcgee, MD  ?  ?  ? ?  ?  ?  ? ? ?

## 2021-11-05 ENCOUNTER — Encounter: Payer: Self-pay | Admitting: Family Medicine

## 2021-11-08 ENCOUNTER — Other Ambulatory Visit: Payer: Self-pay | Admitting: Family Medicine

## 2021-11-08 MED ORDER — TRULICITY 1.5 MG/0.5ML ~~LOC~~ SOAJ: 1.5000 mg | SUBCUTANEOUS | 3 refills | Status: DC

## 2021-11-08 NOTE — Progress Notes (Unsigned)
Cardiology Office Note Date:  11/10/2021  Patient ID:  Lee [name], Lee [name] 05/11/1951, MRN [id number] PCP:  Susy Frizzle, MD  Cardiologist:  Dr. Claiborne Billings Electrophysiologist: Dr. Rayann Heman    Chief Complaint: annual EP visit  History of Present Illness: Lee [name] is a 71 y.o. male with history of CAD (PCIs 2007, 2009, 2012), HTN, HLD, DM, COPD, advanced heart block with PPM, AFib  He comes in today to be seen for Dr. Rayann Heman, last seen by him 09/01/20. Device functioning normally, noted to be dependent. No afib since his ablation in 2010, off a/c.  Discussed resuming if recurrent.  Most recently saw V. Bhagat, PA-C 08/05/21 via a telephone visit for pre-op assessment prior to thumb surgery. Noted good exertional capacity without symptoms and felt an acceptable cardiac risk for his planned surgery.  TODAY He is doing very well Intermittently feels PVCs that seem to come for a few days then g one again, not so much that he would want an adjustment in his meds. No CP, no SIB He exercises regularly, either walks outside or he and his wife go to the fitness center and he will use the bike or rowing machine. No exertional incapacities. No near syncope or syncope.  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 2009 PCI/DES to mLAD; b. 05/2010 s/p DES RCA and LAD.; c. 05/2013 Cath: LAD mild ISR, LCX nl, RCA 40p, patent stents.   Cataract    has had lasik surg   COPD (chronic obstructive pulmonary disease) (HCC)    Depression    Diabetes mellitus type 2, controlled, without complications ([place])    Diabetes mellitus without complication ([place])    Phreesia 08/22/2020   Diverticulosis    DJD (degenerative joint disease)    Esophagitis 1991   grade 1   GERD (gastroesophageal reflux disease)    Hiatal hernia    Hemorrhoids    Hyperlipidemia    Hypertension    Hypertensive heart disease    Iron deficiency anemia    Migraines    Myocardial infarction [facility])    Paroxysmal atrial fibrillation ([place])    a. s/p afib ablation 10/22/08 ([facility]).   PVC's (premature ventricular contractions)    Sleep apnea    Questionalble: RDI during the total sleep time 6h 28 mins was 3.55/hr during REM sleep was at 5.71/hr. Supine AHI was 5.93/hr.   Syncope 08/2018    Past Surgical History:  Procedure Laterality Date   ABDOMINAL HYSTERECTOMY     CARDIAC CATHETERIZATION  05/31/2010   The proximal LAD was then predilated with 2.0 x 12 trek. this was then stented with a 2.5 x 16 promus Element drug-eluting stent at 14 atmosphere and prostdilated with 2.75 x 12 [place] trek at 16 atmospheres(2.8 mm) resulting the reduction of 80% proximal LAD stenosis to 0% residual with excellent flow.   CARDIAC CATHETERIZATION  06/05/2010   Successful percutaneous coronary intervention to the right coronary artery with percutaneous transluminal coronary angioplasty/stenting and insertion 3.0 x 16 mm Promus DES post dilated to 3.35 mm with stenosis being reduced to 0%   CARDIAC CATHETERIZATION  02/29/2008   Post dilatation was performed using a 2.75 x 9 [place] sprinter, 10 atmospheres for 40 seconds and then 9 atmosphere for 35 sec. This resulted in  the 80% area of narrowing pre-intervention, now appearing to normal. There was no edvidence of the dissection or thrombus and there was TIMI III flow pre and post.   CARDIAC CATHETERIZATION  02/28/2006   CORONARY ANGIOPLASTY WITH STENT PLACEMENT     3 stents/ 4 caths   EYE SURGERY     FINGER ARTHROPLASTY Right 09/02/2021   Procedure: RIGHT THUMB CARPOMETACARPAL ARTHROPLASTY;  Surgeon: Leandrew Koyanagi, MD;  Location: [place];  Service: Orthopedics;  Laterality: Right;  block in preop   KNEE ARTHROSCOPY     right   LASIK     LEFT HEART CATHETERIZATION WITH CORONARY ANGIOGRAM N/A  06/29/2013   Procedure: LEFT HEART CATHETERIZATION WITH CORONARY ANGIOGRAM;  Surgeon: Leonie Man, MD;  Location: [facility] CATH LAB;  Service: Cardiovascular;  Laterality: N/A;   NASAL SINUS SURGERY  06/01/1999   NECK SURGERY     Cervical fusion   PACEMAKER IMPLANT N/A 05/01/2019   Procedure: PACEMAKER IMPLANT;  Surgeon: [name], MD;  Location: [place] CV LAB;  Service: Cardiovascular;  Laterality: N/A;   RADIOFREQUENCY ABLATION  09/28/2008   atrial fibrillation   ROTATOR CUFF REPAIR     left   SHOULDER OPEN ROTATOR CUFF REPAIR     right   SPINE SURGERY     WRIST ARTHROCENTESIS     left    Current Outpatient Medications  Medication Sig Dispense Refill   Accu-Chek FastClix Lancets MISC USE TO CHECK BLOOD SUGAR TWICE DAILY 102 each 3   ACCU-CHEK GUIDE test strip USE TO CHECK BLOOD SUGAR LEVELS TWICE DAILY. 100 strip 1   acetaminophen (TYLENOL) 325 MG tablet Take 1-2 tablets (325-650 mg total) by mouth every 4 (four) hours as needed for mild pain.     amLODipine (NORVASC) 10 MG tablet TAKE 1 TABLET(10 MG) BY MOUTH DAILY 90 tablet 1   aspirin EC 81 MG tablet Take 81 mg by mouth at bedtime.     Blood Glucose Monitoring Suppl (ACCU-CHEK AVIVA PLUS) w/Device KIT Check BS bid E11.9 1 kit 1   cholecalciferol (VITAMIN D) 1000 UNITS tablet Take 1,000 Units by mouth at bedtime.      citalopram (CELEXA) 40 MG tablet TAKE 1 TABLET(40 MG) BY MOUTH DAILY 90 tablet 1   Coenzyme Q10 (CO Q 10) 100 MG CAPS Take 100 mg by mouth daily with breakfast.      Dulaglutide (TRULICITY) 1.5 TO/6.7TI SOPN Inject 1.5 mg into the skin once a week. 2 mL 3   Erenumab-aooe (AIMOVIG) 140 MG/ML SOAJ Inject 140 mg into the skin every 30 (thirty) days. On or about the 1st of each month     fexofenadine (ALLEGRA) 180 MG tablet Take 180 mg by mouth daily with breakfast.      fluticasone (FLONASE) 50 MCG/ACT nasal spray INSTILL 2 SPRAYS IN THE  NOSE AT BEDTIME 48 g 3   furosemide (LASIX) 40 MG tablet TAKE 1 TABLET BY  MOUTH  DAILY 90 tablet 3   JARDIANCE 25 MG TABS tablet TAKE 1 TABLET BY MOUTH DAILY BEFORE BREAKFAST 90 tablet 1   Lancets Misc. (ACCU-CHEK FASTCLIX LANCET) KIT USE AS DIRECTED TO CHECK BLOOD SUGAR TWICE DAILY. DX: E11.9 1 kit 1   Multiple Vitamin (MULTIVITAMIN WITH MINERALS) TABS tablet Take 1 tablet by mouth daily with breakfast.     oxyCODONE-acetaminophen (PERCOCET) 10-325 MG tablet Take 1 tablet by mouth every 8 (eight) hours as needed for pain. 21 tablet 0   pantoprazole (PROTONIX) 40 MG  tablet TAKE 1 TABLET BY MOUTH  DAILY 90 tablet 3   Polyvinyl Alcohol-Povidone (REFRESH OP) Place 1 drop into both eyes at bedtime as needed (dry eyes).     potassium chloride (MICRO-K) 10 MEQ CR capsule TAKE 1 CAPSULE BY MOUTH  DAILY 90 capsule 3   Rimegepant Sulfate (NURTEC PO) Take by mouth.     rosuvastatin (CRESTOR) 40 MG tablet TAKE 1 TABLET BY MOUTH  DAILY 90 tablet 3   Study - CAPTIVA - aspirin 81 mg tablet (PI-Sethi) Chew by mouth.     telmisartan (MICARDIS) 40 MG tablet TAKE 1 TABLET BY MOUTH  DAILY 90 tablet 3   amLODipine (NORVASC) 10 MG tablet Take by mouth. (Patient not taking: Reported on 11/10/2021)     metFORMIN (GLUCOPHAGE-XR) 500 MG 24 hr tablet Take by mouth. (Patient not taking: Reported on 09/21/2021)     nitroGLYCERIN (NITROSTAT) 0.4 MG SL tablet PLACE ONE TABLET UNDER THE TONGUE EVERY 5 MINUTES AS NEEDED FOR CHEST PAIN 25 tablet 3   ondansetron (ZOFRAN) 4 MG tablet Take 1-2 tablets (4-8 mg total) by mouth every 8 (eight) hours as needed for nausea or vomiting. (Patient not taking: Reported on 09/21/2021) 20 tablet 0   oxyCODONE-acetaminophen (PERCOCET) 5-325 MG tablet Take 1-2 tablets by mouth every 8 (eight) hours as needed for severe pain. (Patient not taking: Reported on 11/10/2021) 30 tablet 0   No current facility-administered medications for this visit.    Allergies:   Ibuprofen, Other, Topamax [topiramate], Toprol xl [metoprolol succinate], and Metoprolol-hctz er   Social  History:  The patient  reports that he quit smoking about 33 years ago. His smoking use included cigarettes and cigars. He has a 50.00 pack-year smoking history. He has never used smokeless tobacco. He reports that he does not currently use alcohol. He reports that he does not use drugs.   Family History:  The patient's family history includes Colon cancer in his mother; Diabetes in his father; Hearing loss in his father; Heart disease in his father and sister; Uterine cancer in his mother.  ROS:  Please see the history of present illness.    All other systems are reviewed and otherwise negative.   PHYSICAL EXAM:  VS:  BP 130/80   Pulse 75   Ht _0  (1.727 m)   Wt 221 lb 9.6 oz (100.5 kg)   SpO2 97%   BMI 33.69 kg/m  BMI: Body mass index is 33.69 kg/m. Well nourished, well developed, in no acute distress HEENT: normocephalic, atraumatic Neck: no JVD, carotid bruits or masses Cardiac:  RRR; no significant murmurs, no rubs, or gallops Lungs:  CTA b/l, no wheezing, rhonchi or rales Abd: soft, nontender MS: no deformity or atrophy Ext: no edema Skin: warm and dry, no rash Neuro:  No gross deficits appreciated Psych: euthymic mood, full affect  PPM site is stable, no tethering or discomfort   EKG:  Done today and reviewed by myself shows  SR/ V paced with variable AV delay, 75bpm  Device interrogation done today and reviewed by myself:  Battery and lead measurements are good One AFib episode last year with EGM, 1hr 49mn, otherwise AMS episode (without EGMs) are measured in seconds, <1% burden PMT episodes are logged No retrograde conduction today Unclear if true PMT, though algorithm does stop it I have programmed VIP OFF today given he is device dependent   09/23/2021: TTE  1. Left ventricular ejection fraction, by estimation, is 55 to 60%. The  left ventricle  has normal function. The left ventricle has no regional  wall motion abnormalities. Left ventricular diastolic  parameters are  consistent with Grade I diastolic  dysfunction (impaired relaxation).   2. Right ventricular systolic function is normal. The right ventricular  size is normal. Tricuspid regurgitation signal is inadequate for assessing  PA pressure.   3. The mitral valve is normal in structure. Trivial mitral valve  regurgitation. No evidence of mitral stenosis.   4. The aortic valve has an indeterminant number of cusps. Aortic valve  regurgitation is not visualized. Mild to moderate aortic valve stenosis.   5. The inferior vena cava is normal in size with greater than 50%  respiratory variability, suggesting right atrial pressure of 3 mmHg.    03/07/2020: stress myoview The left ventricular ejection fraction is mildly decreased (45-54%). Nuclear stress EF: 54%. There was no ST segment deviation noted during stress. Defect 1: There is a large defect of moderate severity present in the basal inferior, mid inferior, apical inferior and apex location. This is an intermediate risk study.   There is a large defect of moderate severity throughout the entire inferior wall. This is unchanged between stress and rest images. There is normal wall motion in this area. There is significant extracardiac activity in the area as well. Taken together, this highly suggests artifact, but fixed defect cannot be excluded. No evidence of reversible ischemia.  Recent Labs: 06/26/2021: ALT 20; Hemoglobin 16.2; Platelets 253 08/25/2021: BUN 15; Creatinine, Ser 0.82; Potassium 4.6; Sodium 135  06/26/2021: Cholesterol 146; HDL 57; LDL Cholesterol (Calc) 72; Total CHOL/HDL Ratio 2.6; Triglycerides 91   CrCl cannot be calculated (Patient's most recent lab result is older than the maximum 21 days allowed.).   Wt Readings from Last 3 Encounters:  11/10/21 221 lb 9.6 oz (100.5 kg)  09/21/21 224 lb 6.4 oz (101.8 kg)  09/02/21 224 lb 6.9 oz (101.8 kg)     Other studies reviewed: Additional studies/records reviewed  today include: summarized above  ASSESSMENT AND PLAN:  PPM Intact function VIP programmed off  Paroxysmal AFib <1 % burden Noted report ablation 2010, none since Follow burden Off a/c  CAD No symptoms Sees Dr. Claiborne Billings in the fall Labs are done with his PMD  HTN Looks good  Disposition: F/u with remotes as usual, in clinic with EP in a year, sooner if needed.  Current medicines are reviewed at length with the patient today.  The patient did not have any concerns regarding medicines.  Venetia Night, PA-C 11/10/2021 12:22 PM     [address] [place] [id number] [phone] (office)  [phone] (fax)

## 2021-11-10 ENCOUNTER — Ambulatory Visit (INDEPENDENT_AMBULATORY_CARE_PROVIDER_SITE_OTHER): Payer: Medicare Other | Admitting: Physician Assistant

## 2021-11-10 ENCOUNTER — Encounter: Payer: Self-pay | Admitting: Physician Assistant

## 2021-11-10 VITALS — BP 130/80 | HR 75 | Ht 68.0 in | Wt 221.6 lb

## 2021-11-10 DIAGNOSIS — Z950 Presence of cardiac pacemaker: Secondary | ICD-10-CM | POA: Diagnosis not present

## 2021-11-10 DIAGNOSIS — I2510 Atherosclerotic heart disease of native coronary artery without angina pectoris: Secondary | ICD-10-CM

## 2021-11-10 DIAGNOSIS — I442 Atrioventricular block, complete: Secondary | ICD-10-CM | POA: Diagnosis not present

## 2021-11-10 DIAGNOSIS — I10 Essential (primary) hypertension: Secondary | ICD-10-CM

## 2021-11-10 DIAGNOSIS — I480 Paroxysmal atrial fibrillation: Secondary | ICD-10-CM | POA: Diagnosis not present

## 2021-11-10 LAB — CUP PACEART INCLINIC DEVICE CHECK
Battery Remaining Longevity: 80 mo
Battery Voltage: 2.99 V
Brady Statistic RA Percent Paced: 0.87 %
Brady Statistic RV Percent Paced: 97 %
Date Time Interrogation Session: 20230613123852
Implantable Lead Implant Date: 20201201
Implantable Lead Implant Date: 20201201
Implantable Lead Location: 753859
Implantable Lead Location: 753860
Implantable Pulse Generator Implant Date: 20201201
Lead Channel Impedance Value: 475 Ohm
Lead Channel Impedance Value: 525 Ohm
Lead Channel Pacing Threshold Amplitude: 0.75 V
Lead Channel Pacing Threshold Amplitude: 0.75 V
Lead Channel Pacing Threshold Amplitude: 1 V
Lead Channel Pacing Threshold Amplitude: 1 V
Lead Channel Pacing Threshold Pulse Width: 0.5 ms
Lead Channel Pacing Threshold Pulse Width: 0.5 ms
Lead Channel Pacing Threshold Pulse Width: 0.5 ms
Lead Channel Pacing Threshold Pulse Width: 0.5 ms
Lead Channel Sensing Intrinsic Amplitude: 2.1 mV
Lead Channel Sensing Intrinsic Amplitude: 3.3 mV
Lead Channel Setting Pacing Amplitude: 2 V
Lead Channel Setting Pacing Amplitude: 2.5 V
Lead Channel Setting Pacing Pulse Width: 0.5 ms
Lead Channel Setting Sensing Sensitivity: 2 mV
Pulse Gen Model: 2272
Pulse Gen Serial Number: 9182765

## 2021-11-10 MED ORDER — NITROGLYCERIN 0.4 MG SL SUBL: SUBLINGUAL_TABLET | SUBLINGUAL | 3 refills | Status: DC

## 2021-11-10 NOTE — Patient Instructions (Signed)
Medication Instructions:   Your physician recommends that you continue on your current medications as directed. Please refer to the Current Medication list given to you today.  *If you need a refill on your cardiac medications before your next appointment, please call your pharmacy*   Lab Work: [place]   If you have labs (blood work) drawn today and your tests are completely normal, you will receive your results only by: [place] (if you have MyChart) OR A paper copy in the mail If you have any lab test that is abnormal or we need to change your treatment, we will call you to review the results.   Testing/Procedures: NONE ORDERED  TODAY    Follow-Up: At [facility], you and your health needs are our priority.  As part of our continuing mission to provide you with exceptional heart care, we have created designated Provider Care Teams.  These Care Teams include your primary Cardiologist (physician) and Advanced Practice Providers (APPs -  Physician Assistants and Nurse Practitioners) who all work together to provide you with the care you need, when you need it.  We recommend signing up for the patient portal called "MyChart".  Sign up information is provided on this After Visit Summary.  MyChart is used to connect with patients for Virtual Visits (Telemedicine).  Patients are able to view lab/test results, encounter notes, upcoming appointments, etc.  Non-urgent messages can be sent to your provider as well.   To learn more about what you can do with MyChart, go to [email or web address].    Your next appointment:   1 year(s)  The format for your next appointment:   In Person  Provider:   Tommye Standard, PA-C    Other Instructions   Important Information About Sugar

## 2021-11-15 ENCOUNTER — Other Ambulatory Visit: Payer: Self-pay | Admitting: Family Medicine

## 2021-11-16 NOTE — Telephone Encounter (Signed)
Requested medication (s) are due for refill today: Yes  Requested medication (s) are on the active medication list: Yes  Last refill:  11/12/20  Future visit scheduled: No  Notes to clinic:  Prescription has expired.    Requested Prescriptions  Pending Prescriptions Disp Refills   Lancets Misc. (ACCU-CHEK FASTCLIX LANCET) KIT [Pharmacy Med Name: ACCU-CHEK FASTCLIX LANCET [place]: USE AS DIRECTED TO [place] BLOOD SUGAR TWICE DAILY     Endocrinology: Diabetes - Testing Supplies Passed - 11/16/2021  2:16 PM      Passed - Valid encounter within last 12 months    Recent Outpatient Visits           1 month ago Coronary artery disease involving native coronary artery of native heart without angina pectoris   [place] Susy Frizzle, MD   4 months ago Type 2 diabetes mellitus with complication, without long-term current use of insulin ([place])   [place] Pickard, Cammie Mcgee, MD   12 months ago Wrist pain, chronic, right   [place] Pickard, Cammie Mcgee, MD   1 year ago General medical exam   [place] Susy Frizzle, MD   1 year ago Type 2 diabetes mellitus with complication, without long-term current use of insulin ([place])   [place] Pickard, Cammie Mcgee, MD       Future Appointments             In 4 months Troy Sine, MD [place], [facility]

## 2021-11-17 ENCOUNTER — Ambulatory Visit (INDEPENDENT_AMBULATORY_CARE_PROVIDER_SITE_OTHER): Payer: Medicare Other | Admitting: Podiatry

## 2021-11-17 ENCOUNTER — Encounter: Payer: Self-pay | Admitting: Podiatry

## 2021-11-17 DIAGNOSIS — D2371 Other benign neoplasm of skin of right lower limb, including hip: Secondary | ICD-10-CM | POA: Diagnosis not present

## 2021-11-17 DIAGNOSIS — M7751 Other enthesopathy of right foot: Secondary | ICD-10-CM | POA: Diagnosis not present

## 2021-11-17 DIAGNOSIS — D2372 Other benign neoplasm of skin of left lower limb, including hip: Secondary | ICD-10-CM

## 2021-11-17 DIAGNOSIS — M7752 Other enthesopathy of left foot: Secondary | ICD-10-CM

## 2021-11-17 MED ORDER — DEXAMETHASONE SODIUM PHOSPHATE 120 MG/30ML IJ SOLN
2.0000 mg | Freq: Once | INTRAMUSCULAR | Status: AC
  Administered 2021-11-17: 2 mg via INTRA_ARTICULAR

## 2021-11-17 NOTE — Progress Notes (Signed)
He presents today chief complaint of painful area to the plantar aspect of the bilateral foot subfifth metatarsal base.  He states that this right one is really giving me problems.  Objective: Vital signs are stable he is alert and oriented x3 pulses are palpable.  He does have benign reactive hyperkeratotic lesion signs of fifth metatarsal head and base bilaterally though the right base does feel fluctuant as if there is a bursa associated with this.  Assessment: Bursitis and benign skin lesions.  Plan: Injected the bursa to the right side fifth met base today and debrided all benign skin lesions.  Placed salicylic acid under occlusion right subfifth and he will leave that on for 3 to 4 days before washing it off.  Follow-up with him in a few months. 

## 2021-11-26 ENCOUNTER — Other Ambulatory Visit: Payer: Self-pay | Admitting: Cardiovascular Disease

## 2021-11-27 ENCOUNTER — Ambulatory Visit (INDEPENDENT_AMBULATORY_CARE_PROVIDER_SITE_OTHER): Payer: Medicare Other

## 2021-11-27 DIAGNOSIS — I442 Atrioventricular block, complete: Secondary | ICD-10-CM

## 2021-11-29 LAB — CUP PACEART REMOTE DEVICE CHECK
Battery Remaining Longevity: 76 mo
Battery Remaining Percentage: 75 %
Battery Voltage: 2.99 V
Brady Statistic AP VP Percent: 1 %
Brady Statistic AP VS Percent: 1 %
Brady Statistic AS VP Percent: 99 %
Brady Statistic AS VS Percent: 1 %
Brady Statistic RA Percent Paced: 1 %
Brady Statistic RV Percent Paced: 99 %
Date Time Interrogation Session: 20230630144925
Implantable Lead Implant Date: 20201201
Implantable Lead Implant Date: 20201201
Implantable Lead Location: 753859
Implantable Lead Location: 753860
Implantable Pulse Generator Implant Date: 20201201
Lead Channel Impedance Value: 460 Ohm
Lead Channel Impedance Value: 460 Ohm
Lead Channel Pacing Threshold Amplitude: 0.75 V
Lead Channel Pacing Threshold Amplitude: 1 V
Lead Channel Pacing Threshold Pulse Width: 0.5 ms
Lead Channel Pacing Threshold Pulse Width: 0.5 ms
Lead Channel Sensing Intrinsic Amplitude: 2.8 mV
Lead Channel Sensing Intrinsic Amplitude: 3.8 mV
Lead Channel Setting Pacing Amplitude: 2 V
Lead Channel Setting Pacing Amplitude: 2.5 V
Lead Channel Setting Pacing Pulse Width: 0.5 ms
Lead Channel Setting Sensing Sensitivity: 2 mV
Pulse Gen Model: 2272
Pulse Gen Serial Number: 9182765

## 2021-12-02 ENCOUNTER — Encounter: Payer: Self-pay | Admitting: Orthopaedic Surgery

## 2021-12-02 ENCOUNTER — Ambulatory Visit (INDEPENDENT_AMBULATORY_CARE_PROVIDER_SITE_OTHER): Payer: Medicare Other | Admitting: Orthopaedic Surgery

## 2021-12-02 DIAGNOSIS — M1811 Unilateral primary osteoarthritis of first carpometacarpal joint, right hand: Secondary | ICD-10-CM

## 2021-12-02 NOTE — Progress Notes (Signed)
Post-Op Visit Note   Patient: Lee [name]           Date of Birth: [birth year]           MRN: [id number] Visit Date: 12/02/2021 PCP: Susy Frizzle, MD   Assessment & Plan:  Chief Complaint:  Chief Complaint  Patient presents with   Right Hand - Follow-up    [facility] arthroplasty 09/02/2021   Visit Diagnoses:  1. Primary osteoarthritis of first carpometacarpal joint of right hand     Plan: Changes 3 months status post right thumb [facility] arthroplasty on 09/02/2021.  [facility].  He is doing very well has no complaints.  His hand is very functional.  Feels some discomfort with overactivity.  Examination of right thumb shows full healed surgical scar.  He has excellent pinch and grip strength.  He can oppose his thumb tip to the fifth metacarpal head without any difficulty or pain.  Tadd has done extremely well from the surgery.  He is very happy.  He has no limitations and can use his hand as he feels comfortable.  From my standpoint he can follow-up as needed.  Follow-Up Instructions: Return if symptoms worsen or fail to improve.   Orders:  No orders of the defined types were placed in this encounter.  No orders of the defined types were placed in this encounter.   Imaging: No results found.  PMFS History: Patient Active Problem List   Diagnosis Date Noted   Primary osteoarthritis of first carpometacarpal joint of right hand 09/02/2021   Pain in right knee 02/12/2020   Second degree Mobitz II AV block 04/30/2019   Syncope and collapse 10/10/2018   Heart block AV complete ([place]) 10/10/2018   Symptomatic bradycardia 10/10/2018   Syncope, vasovagal 09/20/2018   Neck mass 10/14/2016   Depression 05/20/2016   Hypertensive heart disease    Paroxysmal atrial fibrillation ([place])    Hyperlipidemia LDL goal <70 03/27/2014   Premature ventricular contraction 03/04/2014   Chest pain with high risk of acute coronary syndrome 06/28/2013   Morbid  obesity ([place]) 06/28/2013   OSA (obstructive sleep apnea) 12/18/2012   Sleepiness 11/06/2012   Fatigue 11/06/2012   PALPITATIONS 06/02/2009   DYSLIPIDEMIA 09/05/2008   Essential hypertension 09/05/2008   CAD S/P percutaneous coronary angioplasty 09/05/2008   RBBB 09/05/2008   SUPRAVENTRICULAR TACHYCARDIA 09/05/2008   ALLERGIC RHINITIS 09/05/2008   GERD 09/05/2008   BACK PAIN 09/05/2008   ARRHYTHMIA, HX OF 09/05/2008   MIGRAINES, HX OF 09/05/2008   Dyslipidemia 09/05/2008   Past Medical History:  Diagnosis Date   Allergic rhinitis    chronic sinusitis   Allergy    Arthritis    CAD (coronary artery disease)    a. 2009 PCI/DES to mLAD; b. 05/2010 s/p DES RCA and LAD.; c. 05/2013 Cath: LAD mild ISR, LCX nl, RCA 40p, patent stents.   Cataract    has had lasik surg   COPD (chronic obstructive pulmonary disease) (HCC)    Depression    Diabetes mellitus type 2, controlled, without complications ([place])    Diabetes mellitus without complication ([place])    Phreesia 08/22/2020   Diverticulosis    DJD (degenerative joint disease)    Esophagitis 1991   grade 1   GERD (gastroesophageal reflux disease)    Hiatal hernia   Hemorrhoids    Hyperlipidemia    Hypertension    Hypertensive heart disease    Iron deficiency anemia  Migraines    Myocardial infarction [facility])    Paroxysmal atrial fibrillation ([place])    a. s/p afib ablation 10/22/08 ([facility]).   PVC's (premature ventricular contractions)    Sleep apnea    Questionalble: RDI during the total sleep time 6h 28 mins was 3.55/hr during REM sleep was at 5.71/hr. Supine AHI was 5.93/hr.   Syncope 08/2018    Family History  Problem Relation Age of Onset   Heart disease Father        and sister   Diabetes Father    Hearing loss Father    Colon cancer Mother        mets from uterine   Uterine cancer Mother    Heart disease Sister     Past Surgical History:  Procedure Laterality Date   ABDOMINAL HYSTERECTOMY     CARDIAC  CATHETERIZATION  05/31/2010   The proximal LAD was then predilated with 2.0 x 12 trek. this was then stented with a 2.5 x 16 promus Element drug-eluting stent at 14 atmosphere and prostdilated with 2.75 x 12 [place] trek at 16 atmospheres(2.8 mm) resulting the reduction of 80% proximal LAD stenosis to 0% residual with excellent flow.   CARDIAC CATHETERIZATION  06/05/2010   Successful percutaneous coronary intervention to the right coronary artery with percutaneous transluminal coronary angioplasty/stenting and insertion 3.0 x 16 mm Promus DES post dilated to 3.35 mm with stenosis being reduced to 0%   CARDIAC CATHETERIZATION  02/29/2008   Post dilatation was performed using a 2.75 x 9 [place] sprinter, 10 atmospheres for 40 seconds and then 9 atmosphere for 35 sec. This resulted in the 80% area of narrowing pre-intervention, now appearing to normal. There was no edvidence of the dissection or thrombus and there was TIMI III flow pre and post.   CARDIAC CATHETERIZATION  02/28/2006   CORONARY ANGIOPLASTY WITH STENT PLACEMENT     3 stents/ 4 caths   EYE SURGERY     FINGER ARTHROPLASTY Right 09/02/2021   Procedure: RIGHT THUMB CARPOMETACARPAL ARTHROPLASTY;  Surgeon: Leandrew Koyanagi, MD;  Location: [place];  Service: Orthopedics;  Laterality: Right;  block in preop   KNEE ARTHROSCOPY     right   LASIK     LEFT HEART CATHETERIZATION WITH CORONARY ANGIOGRAM N/A 06/29/2013   Procedure: LEFT HEART CATHETERIZATION WITH CORONARY ANGIOGRAM;  Surgeon: Leonie Man, MD;  Location: [facility] CATH LAB;  Service: Cardiovascular;  Laterality: N/A;   NASAL SINUS SURGERY  06/01/1999   NECK SURGERY     Cervical fusion   PACEMAKER IMPLANT N/A 05/01/2019   Procedure: PACEMAKER IMPLANT;  Surgeon: [name], MD;  Location: [place] CV LAB;  Service: Cardiovascular;  Laterality: N/A;   RADIOFREQUENCY ABLATION  09/28/2008   atrial fibrillation   ROTATOR CUFF REPAIR     left   SHOULDER OPEN ROTATOR CUFF REPAIR      right   SPINE SURGERY     WRIST ARTHROCENTESIS     left   Social History   Occupational History   Occupation: retired  Tobacco Use   Smoking status: Former    Packs/day: 2.00    Years: 25.00    Total pack years: 50.00    Types: Cigarettes, Cigars    Quit date: 04/22/1988    Years since quitting: 33.6   Smokeless tobacco: Never  Vaping Use   Vaping Use: Never used  Substance and Sexual Activity   Alcohol use: Not Currently    Comment: once every  6-7 months   Drug use: No   Sexual activity: Not Currently    Birth control/protection: Surgical

## 2021-12-07 ENCOUNTER — Ambulatory Visit (INDEPENDENT_AMBULATORY_CARE_PROVIDER_SITE_OTHER): Payer: Medicare Other | Admitting: Family Medicine

## 2021-12-07 ENCOUNTER — Other Ambulatory Visit: Payer: Self-pay | Admitting: Family Medicine

## 2021-12-07 VITALS — BP 138/98 | HR 86 | Temp 97.6°F | Ht 68.0 in | Wt 218.0 lb

## 2021-12-07 DIAGNOSIS — I10 Essential (primary) hypertension: Secondary | ICD-10-CM | POA: Diagnosis not present

## 2021-12-07 DIAGNOSIS — E118 Type 2 diabetes mellitus with unspecified complications: Secondary | ICD-10-CM

## 2021-12-07 DIAGNOSIS — I480 Paroxysmal atrial fibrillation: Secondary | ICD-10-CM

## 2021-12-07 DIAGNOSIS — G43909 Migraine, unspecified, not intractable, without status migrainosus: Secondary | ICD-10-CM

## 2021-12-07 DIAGNOSIS — I2510 Atherosclerotic heart disease of native coronary artery without angina pectoris: Secondary | ICD-10-CM | POA: Diagnosis not present

## 2021-12-07 NOTE — Progress Notes (Signed)
Subjective:    Patient ID: Lee [name], male    DOB: 10-12-1950, 71 y.o.   MRN: [id number]  HPI  HgA1c was 7.4 in January and was down to 6.3 in April.   Wt Readings from Last 3 Encounters:  12/07/21 218 lb (98.9 kg)  11/10/21 221 lb 9.6 oz (100.5 kg)  09/21/21 224 lb 6.4 oz (101.8 kg)   Patient is here today for follow-up.  His blood pressure is elevated at 138/98.  However he is checking his consistently at home and he states that his systolic blood pressures are in the 120 range and his diastolic blood pressures are in the 70 range.  He is tolerating Trulicity 1.5 mg weekly without difficulty.  He denies any nausea or vomiting.  He denies any diarrhea.  His medication list is incorrect.  He is not on metformin.  He is taking Jardiance due to the cardioprotective benefits.  He denies any neuropathy in his feet.  Diabetic foot exam was performed today and was normal.  However he has been having occasional palpitations.  Symptoms sound like PVCs however at other times he is experiencing his heart "racing".  He states that these primarily occur when he sitting still.  He denies any syncope or near syncope.  He does have a history of paroxysmal atrial fibrillation and a pacemaker.  However, the patient underwent an ablation in the past and to our knowledge has not been in atrial fibrillation since.  He is no longer on anticoagulation other than aspirin. Past Medical History:  Diagnosis Date   Allergic rhinitis    chronic sinusitis   Allergy    Arthritis    CAD (coronary artery disease)    a. 2009 PCI/DES to mLAD; b. 05/2010 s/p DES RCA and LAD.; c. 05/2013 Cath: LAD mild ISR, LCX nl, RCA 40p, patent stents.   Cataract    has had lasik surg   COPD (chronic obstructive pulmonary disease) (HCC)    Depression    Diabetes mellitus type 2, controlled, without complications ([place])    Diabetes mellitus without complication ([place])    Phreesia 08/22/2020   Diverticulosis    DJD (degenerative  joint disease)    Esophagitis 1991   grade 1   GERD (gastroesophageal reflux disease)    Hiatal hernia   Hemorrhoids    Hyperlipidemia    Hypertension    Hypertensive heart disease    Iron deficiency anemia    Migraines    Myocardial infarction [facility])    Paroxysmal atrial fibrillation ([place])    a. s/p afib ablation 10/22/08 ([facility]).   PVC's (premature ventricular contractions)    Sleep apnea    Questionalble: RDI during the total sleep time 6h 28 mins was 3.55/hr during REM sleep was at 5.71/hr. Supine AHI was 5.93/hr.   Syncope 08/2018   Past Surgical History:  Procedure Laterality Date   ABDOMINAL HYSTERECTOMY     CARDIAC CATHETERIZATION  05/31/2010   The proximal LAD was then predilated with 2.0 x 12 trek. this was then stented with a 2.5 x 16 promus Element drug-eluting stent at 14 atmosphere and prostdilated with 2.75 x 12 [place] trek at 16 atmospheres(2.8 mm) resulting the reduction of 80% proximal LAD stenosis to 0% residual with excellent flow.   CARDIAC CATHETERIZATION  06/05/2010   Successful percutaneous coronary intervention to the right coronary artery with percutaneous transluminal coronary angioplasty/stenting and insertion 3.0 x 16 mm Promus DES post dilated to 3.35 mm with stenosis being  reduced to 0%   CARDIAC CATHETERIZATION  02/29/2008   Post dilatation was performed using a 2.75 x 9 [place] sprinter, 10 atmospheres for 40 seconds and then 9 atmosphere for 35 sec. This resulted in the 80% area of narrowing pre-intervention, now appearing to normal. There was no edvidence of the dissection or thrombus and there was TIMI III flow pre and post.   CARDIAC CATHETERIZATION  02/28/2006   CORONARY ANGIOPLASTY WITH STENT PLACEMENT     3 stents/ 4 caths   EYE SURGERY     FINGER ARTHROPLASTY Right 09/02/2021   Procedure: RIGHT THUMB CARPOMETACARPAL ARTHROPLASTY;  Surgeon: Leandrew Koyanagi, MD;  Location: [place];  Service: Orthopedics;  Laterality: Right;  block in  preop   KNEE ARTHROSCOPY     right   LASIK     LEFT HEART CATHETERIZATION WITH CORONARY ANGIOGRAM N/A 06/29/2013   Procedure: LEFT HEART CATHETERIZATION WITH CORONARY ANGIOGRAM;  Surgeon: Leonie Man, MD;  Location: [facility] CATH LAB;  Service: Cardiovascular;  Laterality: N/A;   NASAL SINUS SURGERY  06/01/1999   NECK SURGERY     Cervical fusion   PACEMAKER IMPLANT N/A 05/01/2019   Procedure: PACEMAKER IMPLANT;  Surgeon: [name], MD;  Location: [place] CV LAB;  Service: Cardiovascular;  Laterality: N/A;   RADIOFREQUENCY ABLATION  09/28/2008   atrial fibrillation   ROTATOR CUFF REPAIR     left   SHOULDER OPEN ROTATOR CUFF REPAIR     right   SPINE SURGERY     WRIST ARTHROCENTESIS     left   Current Outpatient Medications on File Prior to Visit  Medication Sig Dispense Refill   Accu-Chek FastClix Lancets MISC USE TO CHECK BLOOD SUGAR TWICE DAILY 102 each 3   ACCU-CHEK GUIDE test strip USE TO CHECK BLOOD SUGAR LEVELS TWICE DAILY. 100 strip 1   acetaminophen (TYLENOL) 325 MG tablet Take 1-2 tablets (325-650 mg total) by mouth every 4 (four) hours as needed for mild pain.     amLODipine (NORVASC) 10 MG tablet Take by mouth.     amLODipine (NORVASC) 10 MG tablet TAKE 1 TABLET(10 MG) BY MOUTH DAILY 90 tablet 1   aspirin EC 81 MG tablet Take 81 mg by mouth at bedtime.     Blood Glucose Monitoring Suppl (ACCU-CHEK AVIVA PLUS) w/Device KIT Check BS bid E11.9 1 kit 1   cholecalciferol (VITAMIN D) 1000 UNITS tablet Take 1,000 Units by mouth at bedtime.      citalopram (CELEXA) 40 MG tablet TAKE 1 TABLET(40 MG) BY MOUTH DAILY 90 tablet 1   Coenzyme Q10 (CO Q 10) 100 MG CAPS Take 100 mg by mouth daily with breakfast.      Dulaglutide (TRULICITY) 1.5 GU/4.4IH SOPN Inject 1.5 mg into the skin once a week. 2 mL 3   Erenumab-aooe (AIMOVIG) 140 MG/ML SOAJ Inject 140 mg into the skin every 30 (thirty) days. On or about the 1st of each month     fexofenadine (ALLEGRA) 180 MG tablet Take 180 mg  by mouth daily with breakfast.      fluticasone (FLONASE) 50 MCG/ACT nasal spray INSTILL 2 SPRAYS IN THE  NOSE AT BEDTIME 48 g 3   furosemide (LASIX) 40 MG tablet TAKE 1 TABLET BY MOUTH  DAILY 90 tablet 3   JARDIANCE 25 MG TABS tablet TAKE 1 TABLET BY MOUTH DAILY BEFORE BREAKFAST 90 tablet 1   Lancets Misc. (ACCU-CHEK FASTCLIX LANCET) KIT USE AS DIRECTED TO CHECK BLOOD SUGAR TWICE DAILY  1 kit 1   metFORMIN (GLUCOPHAGE-XR) 500 MG 24 hr tablet Take by mouth.     Multiple Vitamin (MULTIVITAMIN WITH MINERALS) TABS tablet Take 1 tablet by mouth daily with breakfast.     nitroGLYCERIN (NITROSTAT) 0.4 MG SL tablet PLACE ONE TABLET UNDER THE TONGUE EVERY 5 MINUTES AS NEEDED FOR CHEST PAIN 25 tablet 3   ondansetron (ZOFRAN) 4 MG tablet Take 1-2 tablets (4-8 mg total) by mouth every 8 (eight) hours as needed for nausea or vomiting. 20 tablet 0   oxyCODONE-acetaminophen (PERCOCET) 10-325 MG tablet Take 1 tablet by mouth every 8 (eight) hours as needed for pain. 21 tablet 0   pantoprazole (PROTONIX) 40 MG tablet TAKE 1 TABLET BY MOUTH  DAILY 90 tablet 3   Polyvinyl Alcohol-Povidone (REFRESH OP) Place 1 drop into both eyes at bedtime as needed (dry eyes).     potassium chloride (MICRO-K) 10 MEQ CR capsule TAKE 1 CAPSULE BY MOUTH  DAILY 90 capsule 3   Rimegepant Sulfate (NURTEC PO) Take by mouth.     rosuvastatin (CRESTOR) 40 MG tablet TAKE 1 TABLET BY MOUTH  DAILY 90 tablet 3   Study - CAPTIVA - aspirin 81 mg tablet (PI-Sethi) Chew by mouth.     telmisartan (MICARDIS) 40 MG tablet TAKE 1 TABLET BY MOUTH  DAILY 90 tablet 3   No current facility-administered medications on file prior to visit.   Allergies  Allergen Reactions   Ibuprofen Swelling    Whole body swelled   Other Shortness Of Breath    BETA BLOCKERS   Topamax [Topiramate] Other (See Comments)    Pt states it made his tongue feel "scalded" and he couldn't eat    Toprol Xl [Metoprolol Succinate] Other (See Comments)    Personality change    Metoprolol-Hctz Er Other (See Comments)    Indigestion,mouth raw   Social History   Socioeconomic History   Marital status: Married    Spouse name: Katharine Look   Number of children: 2   Years of education: Not on file   Highest education level: Not on file  Occupational History   Occupation: retired  Tobacco Use   Smoking status: Former    Packs/day: 2.00    Years: 25.00    Total pack years: 50.00    Types: Cigarettes, Cigars    Quit date: 04/22/1988    Years since quitting: 33.6   Smokeless tobacco: Never  Vaping Use   Vaping Use: Never used  Substance and Sexual Activity   Alcohol use: Not Currently    Comment: once every 6-7 months   Drug use: No   Sexual activity: Not Currently    Birth control/protection: Surgical  Other Topics Concern   Not on file  Social History Narrative   Retired Social research officer, 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   Currently works part time for [employer].   2 daughters.    Married x 43 years 09/2021.   Social Determinants of Health   Financial Resource Strain: Low Risk  (08/27/2021)   Overall Financial Resource Strain (CARDIA)    Difficulty of Paying Living Expenses: Not hard at all  Food Insecurity: No Food Insecurity (08/27/2021)   Hunger Vital Sign    Worried About Running Out of Food in the Last Year: Never true    Ran Out of Food in the Last Year: Never true  Transportation Needs: No Transportation Needs (08/27/2021)   PRAPARE - Hydrologist (Medical): No  Lack of Transportation (Non-Medical): No  Physical Activity: Sufficiently Active (08/27/2021)   Exercise Vital Sign    Days of Exercise per Week: 4 days    Minutes of Exercise per Session: 60 min  Stress: No Stress Concern Present (08/27/2021)   [place]    Feeling of Stress : Not at all  Social Connections: [place] (08/27/2021)   Social Connection and Isolation Panel  [NHANES]    Frequency of Communication with Friends and Family: More than three times a week    Frequency of Social Gatherings with Friends and Family: More than three times a week    Attends Religious Services: More than 4 times per year    Active Member of [employer] or Organizations: Yes    Attends Archivist Meetings: More than 4 times per year    Marital Status: Married  Human resources officer Violence: Not At Risk (08/27/2021)   Humiliation, Afraid, Rape, and Kick questionnaire    Fear of Current or Ex-Partner: No    Emotionally Abused: No    Physically Abused: No    Sexually Abused: No      Review of Systems  All other systems reviewed and are negative.      Objective:   Physical Exam Vitals reviewed.  Constitutional:      General: He is not in acute distress.    Appearance: He is not ill-appearing, toxic-appearing or diaphoretic.  Cardiovascular:     Rate and Rhythm: Normal rate.     Heart sounds: Murmur heard.  Pulmonary:     Effort: Pulmonary effort is normal. No tachypnea or respiratory distress.     Breath sounds: No stridor.  Musculoskeletal:     Right lower leg: No edema.     Left lower leg: No edema.  Neurological:     Mental Status: He is alert.           Assessment & Plan:  Type 2 diabetes mellitus with complication, without long-term current use of insulin (HCC) - Plan: CBC with Differential/Platelet, Lipid panel, COMPLETE METABOLIC PANEL WITH GFR, Hemoglobin A1c, Microalbumin, urine  Coronary artery disease involving native coronary artery of native heart without angina pectoris - Plan: CBC with Differential/Platelet, Lipid panel, COMPLETE METABOLIC PANEL WITH GFR, Hemoglobin A1c, Microalbumin, urine  Essential hypertension  Paroxysmal atrial fibrillation (HCC)  Migraine without status migrainosus, not intractable, unspecified migraine type - Plan: Ambulatory referral to Neurology Today the patient is in normal sinus rhythm.  I recommended that  he call his cardiologist and mention that he is having racing heartbeats in case they want him to wear a monitor to rule out paroxysmal atrial fibrillation.  Obviously that would change his anticoagulation plan.  His blood pressure here is elevated but his home blood pressure sound excellent.  I will check a CBC, CMP lipid panel and A1c.  Continue Trulicity however on a monthly basis we can uptitrate to a maximum of 4.5 mg as long as the patient continues to see weight loss benefit.

## 2021-12-08 LAB — CBC WITH DIFFERENTIAL/PLATELET
Absolute Monocytes: 470 cells/uL (ref 200–950)
Basophils Absolute: 42 cells/uL (ref 0–200)
Basophils Relative: 0.5 %
Eosinophils Absolute: 143 cells/uL (ref 15–500)
Eosinophils Relative: 1.7 %
HCT: 47.3 % (ref 38.5–50.0)
Hemoglobin: 15.6 g/dL (ref 13.2–17.1)
Lymphs Abs: 1756 cells/uL (ref 850–3900)
MCH: 29.5 pg (ref 27.0–33.0)
MCHC: 33 g/dL (ref 32.0–36.0)
MCV: 89.4 fL (ref 80.0–100.0)
MPV: 9.6 fL (ref 7.5–12.5)
Monocytes Relative: 5.6 %
Neutro Abs: 5989 cells/uL (ref 1500–7800)
Neutrophils Relative %: 71.3 %
Platelets: 261 10*3/uL (ref 140–400)
RBC: 5.29 10*6/uL (ref 4.20–5.80)
RDW: 12.1 % (ref 11.0–15.0)
Total Lymphocyte: 20.9 %
WBC: 8.4 10*3/uL (ref 3.8–10.8)

## 2021-12-08 LAB — MICROALBUMIN, URINE: Microalb, Ur: 0.2 mg/dL

## 2021-12-08 LAB — LIPID PANEL
Cholesterol: 118 mg/dL (ref <200)
HDL: 48 mg/dL (ref >=40)
LDL Cholesterol (Calc): 52 mg/dL (calc)
Non-HDL Cholesterol (Calc): 70 mg/dL (calc) (ref <130)
Total CHOL/HDL Ratio: 2.5 (calc) (ref <5.0)
Triglycerides: 96 mg/dL (ref <150)

## 2021-12-08 LAB — COMPLETE METABOLIC PANEL WITH GFR
AG Ratio: 2 (calc) (ref 1.0–2.5)
ALT: 13 U/L (ref 9–46)
AST: 14 U/L (ref 10–35)
Albumin: 4.4 g/dL (ref 3.6–5.1)
Alkaline phosphatase (APISO): 65 U/L (ref 35–144)
BUN: 13 mg/dL (ref 7–25)
CO2: 27 mmol/L (ref 20–32)
Calcium: 9.3 mg/dL (ref 8.6–10.3)
Chloride: 100 mmol/L (ref 98–110)
Creat: 0.89 mg/dL (ref 0.70–1.28)
Globulin: 2.2 g/dL (calc) (ref 1.9–3.7)
Glucose, Bld: 186 mg/dL — ABNORMAL HIGH (ref 65–99)
Potassium: 4.5 mmol/L (ref 3.5–5.3)
Sodium: 136 mmol/L (ref 135–146)
Total Bilirubin: 0.4 mg/dL (ref 0.2–1.2)
Total Protein: 6.6 g/dL (ref 6.1–8.1)
eGFR: 92 mL/min/{1.73_m2} (ref >=60)

## 2021-12-08 LAB — HEMOGLOBIN A1C
Hgb A1c MFr Bld: 6 % of total Hgb — ABNORMAL HIGH (ref <5.7)
Mean Plasma Glucose: 126 mg/dL
eAG (mmol/L): 7 mmol/L

## 2021-12-10 NOTE — Progress Notes (Signed)
Remote pacemaker transmission.   

## 2021-12-10 NOTE — Telephone Encounter (Signed)
Requested medication (s) are due for refill today: Yes  Requested medication (s) are on the active medication list: Yes  Last refill:  05/05/21  Future visit scheduled: No  Notes to clinic:  See request.    Requested Prescriptions  Pending Prescriptions Disp Refills   oxyCODONE-acetaminophen (PERCOCET) 10-325 MG tablet 21 tablet 0    Sig: Take 1 tablet by mouth every 8 (eight) hours as needed for pain.     Not Delegated - Analgesics:  Opioid Agonist Combinations Failed - 12/10/2021  6:42 AM      Failed - This refill cannot be delegated      Failed - Urine Drug Screen completed in last 360 days      Passed - Valid encounter within last 3 months    Recent Outpatient Visits           2 months ago Coronary artery disease involving native coronary artery of native heart without angina pectoris   [place] Susy Frizzle, MD   5 months ago Type 2 diabetes mellitus with complication, without long-term current use of insulin ([place])   [place] Pickard, Cammie Mcgee, MD   1 year ago Wrist pain, chronic, right   [place] Pickard, Cammie Mcgee, MD   1 year ago General medical exam   [place] Susy Frizzle, MD   1 year ago Type 2 diabetes mellitus with complication, without long-term current use of insulin ([place])   [place] Pickard, Cammie Mcgee, MD       Future Appointments             In 3 months Troy Sine, MD [place] [facility], [facility]

## 2021-12-13 ENCOUNTER — Other Ambulatory Visit: Payer: Self-pay | Admitting: Family Medicine

## 2021-12-14 MED ORDER — OXYCODONE-ACETAMINOPHEN 10-325 MG PO TABS: 1.0000 | ORAL_TABLET | Freq: Three times a day (TID) | ORAL | 0 refills | Status: DC | PRN

## 2021-12-27 ENCOUNTER — Encounter: Payer: Self-pay | Admitting: Family Medicine

## 2021-12-29 ENCOUNTER — Other Ambulatory Visit: Payer: Self-pay | Admitting: Family Medicine

## 2021-12-29 MED ORDER — TRULICITY 3 MG/0.5ML ~~LOC~~ SOAJ: 3.0000 mg | SUBCUTANEOUS | 1 refills | Status: DC

## 2022-01-02 ENCOUNTER — Other Ambulatory Visit: Payer: Self-pay | Admitting: Family Medicine

## 2022-01-04 NOTE — Telephone Encounter (Signed)
Requested Prescriptions  Pending Prescriptions Disp Refills  . ACCU-CHEK GUIDE test strip [Pharmacy Med Name: ACCU-CHEK GUIDE TEST STRIPS 100S] 100 strip 1    Sig: USE TO CHECK BLOOD SUGAR LEVELS TWICE DAILY.     Endocrinology: Diabetes - Testing Supplies Passed - 01/02/2022  8:21 AM      Passed - Valid encounter within last 12 months    Recent Outpatient Visits          3 months ago Coronary artery disease involving native coronary artery of native heart without angina pectoris   [place] Susy Frizzle, MD   6 months ago Type 2 diabetes mellitus with complication, without long-term current use of insulin ([place])   [place] Pickard, Cammie Mcgee, MD   1 year ago Wrist pain, chronic, right   [place] Pickard, Cammie Mcgee, MD   1 year ago General medical exam   [place] Susy Frizzle, MD   1 year ago Type 2 diabetes mellitus with complication, without long-term current use of insulin ([place])   [place] Pickard, Cammie Mcgee, MD      Future Appointments            In 2 months Troy Sine, MD [place] [facility], [facility]

## 2022-01-27 ENCOUNTER — Encounter: Payer: Self-pay | Admitting: Neurology

## 2022-01-27 ENCOUNTER — Telehealth: Payer: Self-pay | Admitting: *Deleted

## 2022-01-27 ENCOUNTER — Ambulatory Visit (INDEPENDENT_AMBULATORY_CARE_PROVIDER_SITE_OTHER): Payer: Medicare Other | Admitting: Neurology

## 2022-01-27 VITALS — BP 134/84 | HR 90 | Ht 68.0 in | Wt 220.4 lb

## 2022-01-27 DIAGNOSIS — G43009 Migraine without aura, not intractable, without status migrainosus: Secondary | ICD-10-CM

## 2022-01-27 DIAGNOSIS — G43701 Chronic migraine without aura, not intractable, with status migrainosus: Secondary | ICD-10-CM | POA: Diagnosis not present

## 2022-01-27 MED ORDER — EMGALITY 120 MG/ML ~~LOC~~ SOAJ
120.0000 mg | SUBCUTANEOUS | 11 refills | Status: DC
  Filled 2022-11-26: qty 1, 30d supply, fill #0

## 2022-01-27 NOTE — Telephone Encounter (Signed)
Request Reference Number: [id number]. EMGALITY INJ '120MG'$ /ML is approved through 05/30/2022. Your patient may now fill this prescription and it will be covered.  Letter faxed to pharmacy. Received a receipt of confirmation. Updated pt via mychart.

## 2022-01-27 NOTE — Patient Instructions (Addendum)
Change to Emgality but overlap with aimovig - emgality is once a month Nurtec: Increase to 16 a month and you can take it as needed, Also remember Nurtec lasts a long time so you can "pre-treat"  Follow up 12 weeks in office   Take aimovig Friday When you get Emgality start it immediately Take aimovig again 10/1 and then no more aimovig just continue emgality  Galcanezumab Injection What is this medication? GALCANEZUMAB (gal ka NEZ ue mab) prevents migraines. It works by blocking a substance in the body that causes migraines. It may also be used to treat cluster headaches. It is a monoclonal antibody. This medicine may be used for other purposes; ask your health care provider or pharmacist if you have questions. COMMON BRAND NAME(S): Emgality What should I tell my care team before I take this medication? They need to know if you have any of these conditions: An unusual or allergic reaction to galcanezumab, other medications, foods, dyes, or preservatives Pregnant or trying to get pregnant Breast-feeding How should I use this medication? This medication is injected under the skin. You will be taught how to prepare and give it. Take it as directed on the prescription label. Keep taking it unless your care team tells you to stop. It is important that you put your used needles and syringes in a special sharps container. Do not put them in a trash can. If you do not have a sharps container, call your pharmacist or care team to get one. Talk to your care team about the use of this medication in children. Special care may be needed. Overdosage: If you think you have taken too much of this medicine contact a poison control center or emergency room at once. NOTE: This medicine is only for you. Do not share this medicine with others. What if I miss a dose? If you miss a dose, take it as soon as you can. If it is almost time for your next dose, take only that dose. Do not take double or extra  doses. What may interact with this medication? Interactions are not expected. This list may not describe all possible interactions. Give your health care provider a list of all the medicines, herbs, non-prescription drugs, or dietary supplements you use. Also tell them if you smoke, drink alcohol, or use illegal drugs. Some items may interact with your medicine. What should I watch for while using this medication? Visit your care team for regular checks on your progress. Tell your care team if your symptoms do not start to get better or if they get worse. What side effects may I notice from receiving this medication? Side effects that you should report to your care team as soon as possible: Allergic reactions or angioedema--skin rash, itching or hives, swelling of the face, eyes, lips, tongue, arms, or legs, trouble swallowing or breathing Side effects that usually do not require medical attention (report to your care team if they continue or are bothersome): Pain, redness, or irritation at injection site This list may not describe all possible side effects. Call your doctor for medical advice about side effects. You may report side effects to FDA at 1-800-FDA-1088. Where should I keep my medication? Keep out of the reach of children and pets. Store in a refrigerator or at room temperature between 20 and 25 degrees C (68 and 77 degrees F). Refrigeration (preferred): Store in the refrigerator. Do not freeze. Keep in the original container until you are ready to take it. Remove  the dose from the carton about 30 minutes before it is time for you to use it. If the dose is not used, it may be stored in original container at room temperature for 7 days. Get rid of any unused medication after the expiration date. Room Temperature: This medication may be stored at room temperature for up to 7 days. Keep it in the original container. Protect from light until time of use. If it is stored at room temperature,  get rid of any unused medication after 7 days or after it expires, whichever is first. To get rid of medications that are no longer needed or have expired: Take the medication to a medication take-back program. Check with your pharmacy or law enforcement to find a location. If you cannot return the medication, ask your pharmacist or care team how to get rid of this medication safely. NOTE: This sheet is a summary. It may not cover all possible information. If you have questions about this medicine, talk to your doctor, pharmacist, or health care provider.  2023 [employer] Standard (2021-07-07 00:00:00)

## 2022-01-27 NOTE — Progress Notes (Addendum)
[facility]    Provider:  Dr Jaynee Eagles Requesting Provider: Susy Frizzle, MD Primary Care Provider:  Susy Frizzle, MD  CC:  migraines  Addendum: Patient is doing excellent on Emgality.  He is only having 4 migraines a month and less than 10 total headache days a month.  We will refill his Nurtec for acute management.4 migraine days a month. < 10 total headache days a month. Failed mitrex, Nurtec, maxalt  HPI:  Lee [name] is a 71 y.o. male here as requested by Susy Frizzle, MD for migraines.  Past medical history second-degree Mobitz 2 AV block, heart block complete, symptomatic bradycardia, vasovagal syncope, depression, hypertensive heart disease, hypertension, paroxysmal A-fib, hyperlipidemia, morbid obesity, obstructive sleep apnea, CAD, COPD, depression, DM2, DJD, heart disease, MI, pacemaker, Afib s/p ablation, crdiac caths s/p stents, cervical fusion.  Reviewed notes from [facility], he has tried and failed multiple medications, being treated with Aimovig and Nurtec, using CPAP, they recommended weight loss and he has lost 40 pounds.  Here with wife who provides much information. Migraines for 10+ years, can start unilaterally but often start in the back of the head, pulsating/pounding/throbbing, photophobia/phonophobia, a quiet dark room helps, weather is a trigger, nausea, no vomiting, can last up to a week. He has tried a multitude of medications, Aimovig works the best, no significant side effects, emgality worked better, only 4 migraine days a month and but 15 total headache days a month. Using cpap religiously. Baseline prior to CGRPS was having > 12 migraine days a month and > 20 total headache days a month. Nurtec helps with the headaches as well. He is due to take aimovig September 1st, will change to emgality. They can last 8 hours to 3 days-4 days. No aura or prodrome. No other focal neurologic deficits, associated symptoms, inciting  events or modifiable factors.   Reviewed notes, labs and imaging from outside physicians, which showed: Reviewed notes and all prior medications as below:   Current and past medications: ANALGESICS: Anacin, Goody's, codeine, Combunox, Darvon, Excedrin, Kadian, Lidoderm patch, Lorcet, Percocet, Tylenol, Ultram, Vicodin ANTI-MIGRAINE: Dhe nasal spray, Imitrex, Nurtec, maxalt HEART/BP: Coreg, propranolol, Toprol, Lotensin, Norvasc, atenolol, Cardizem DECONGESTANT/ANTIHISTAMINE: Allegra, Benadryl, Clarinex, Dramamine Entex T, Flonase, Nasonex, Sudafed, Zyrtec ANTI-NAUSEANT: Dramamine NSAIDS: Advil, Aleve MUSCLE RELAXANTS:  ANTI-CONVULSANTS: Topamax, Depakote, Keppra STEROIDS: Hydrocortisone, prednisone SLEEPING PILLS/TRANQUILIZERS: Ambien, Halcion, Tylenol PM, Valium, Xanax ANTI-DEPRESSANTS: Celexa HERBAL: CoQ10 FIBROMYALGIA:  HORMONAL: OTHER: Emgality, Aimovig(contrindicated his BP is elevated and worse on aimovig and also contraindicated due to constipation) PROCEDURES FOR HEADACHES: Botox  CT head: COMPARISON:  09/20/2018   FINDINGS: 05/03/2020 Brain: No acute infarct or hemorrhage. Lateral ventricles and midline structures are unremarkable. No acute extra-axial fluid collections. No mass effect.   Vascular: No hyperdense vessel or unexpected calcification.   Skull: Laceration left parietal convexity of the scalp. No underlying fracture. Remainder of the calvarium is unremarkable.   Sinuses/Orbits: No acute finding.   Other: None.   IMPRESSION: 1. Scalp laceration at the left parietal convexity. 2. No acute intracranial process.     Electronically Signed   By: Randa Ngo M.D.   On: 05/03/2020 20:35  09/20/2018: MRI brain: FINDINGS: BRAIN: There is no acute infarct, acute hemorrhage or extra-axial collection. The midline structures are normal. No midline shift or other mass effect. The white matter signal is normal for the patient's age. The cerebral and cerebellar  volume are age-appropriate. No hydrocephalus. Susceptibility-sensitive sequences show no chronic microhemorrhage or superficial siderosis.  No abnormal contrast enhancement.   VASCULAR: The major intracranial arterial and venous sinus flow voids are normal.   SKULL AND UPPER CERVICAL SPINE: Calvarial bone marrow signal is normal. There is no skull base mass. Visualized upper cervical spine and soft tissues are normal.   SINUSES/ORBITS: No fluid levels or advanced mucosal thickening. No mastoid or middle ear effusion. The orbits are normal.   IMPRESSION: Normal aging brain.     Latest Ref Rng & Units 12/07/2021    2:31 PM 08/25/2021    2:23 PM 06/26/2021    8:04 AM  CMP  Glucose 65 - 99 mg/dL 186  89  132   BUN 7 - 25 mg/dL '13  15  16   ' Creatinine 0.70 - 1.28 mg/dL 0.89  0.82  0.83   Sodium 135 - 146 mmol/L 136  135  136   Potassium 3.5 - 5.3 mmol/L 4.5  4.6  4.6   Chloride 98 - 110 mmol/L 100  100  99   CO2 20 - 32 mmol/L '27  28  29   ' Calcium 8.6 - 10.3 mg/dL 9.3  9.5  9.8   Total Protein 6.1 - 8.1 g/dL 6.6   6.9   Total Bilirubin 0.2 - 1.2 mg/dL 0.4   0.6   AST 10 - 35 U/L 14   23   ALT 9 - 46 U/L 13   20      Review of Systems: Patient complains of symptoms per HPI as well as the following symptoms migraines. Pertinent negatives and positives per HPI. All others negative.   Social History   Socioeconomic History   Marital status: Married    Spouse name: Katharine Look   Number of children: 2   Years of education: Not on file   Highest education level: Not on file  Occupational History   Occupation: retired  Tobacco Use   Smoking status: Former    Packs/day: 2.00    Years: 25.00    Total pack years: 50.00    Types: Cigarettes, Cigars    Quit date: 04/22/1988    Years since quitting: 33.7   Smokeless tobacco: Never  Vaping Use   Vaping Use: Never used  Substance and Sexual Activity   Alcohol use: Not Currently   Drug use: No   Sexual activity: Not Currently     Birth control/protection: Surgical  Other Topics Concern   Not on file  Social History Narrative   Retired Social research officer, 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   Currently works part time for [employer].   2 daughters.    Married x 43 years 09/2021.   Caffiene:  1 cup daily, 2 diet drinks daily. (Caffiene free most time).    Social Determinants of Health   Financial Resource Strain: Low Risk  (08/27/2021)   Overall Financial Resource Strain (CARDIA)    Difficulty of Paying Living Expenses: Not hard at all  Food Insecurity: No Food Insecurity (08/27/2021)   Hunger Vital Sign    Worried About Running Out of Food in the Last Year: Never true    Ran Out of Food in the Last Year: Never true  Transportation Needs: No Transportation Needs (08/27/2021)   PRAPARE - Hydrologist (Medical): No    Lack of Transportation (Non-Medical): No  Physical Activity: Sufficiently Active (08/27/2021)   Exercise Vital Sign    Days of Exercise per Week: 4 days    Minutes of Exercise per Session:  60 min  Stress: No Stress Concern Present (08/27/2021)   [place]    Feeling of Stress : Not at all  Social Connections: Socially Integrated (08/27/2021)   Social Connection and Isolation Panel [NHANES]    Frequency of Communication with Friends and Family: More than three times a week    Frequency of Social Gatherings with Friends and Family: More than three times a week    Attends Religious Services: More than 4 times per year    Active Member of [employer] or Organizations: Yes    Attends Music therapist: More than 4 times per year    Marital Status: Married  Human resources officer Violence: Not At Risk (08/27/2021)   Humiliation, Afraid, Rape, and Kick questionnaire    Fear of Current or Ex-Partner: No    Emotionally Abused: No    Physically Abused: No    Sexually Abused: No    Family History  Problem  Relation Age of Onset   Colon cancer Mother        mets from uterine   Uterine cancer Mother    Heart disease Father        and sister   Diabetes Father    Hearing loss Father    Stroke Father    Heart disease Sister    Lung cancer Sister    Stroke Brother     Past Medical History:  Diagnosis Date   Allergic rhinitis    chronic sinusitis   Allergy    Arthritis    CAD (coronary artery disease)    a. 2009 PCI/DES to mLAD; b. 05/2010 s/p DES RCA and LAD.; c. 05/2013 Cath: LAD mild ISR, LCX nl, RCA 40p, patent stents.   Cataract    has had lasik surg   COPD (chronic obstructive pulmonary disease) (HCC)    Depression    Diabetes mellitus type 2, controlled, without complications ([place])    Diabetes mellitus without complication ([place])    Phreesia 08/22/2020   Diverticulosis    DJD (degenerative joint disease)    Esophagitis 1991   grade 1   GERD (gastroesophageal reflux disease)    Hiatal hernia   Hemorrhoids    Hyperlipidemia    Hypertension    Hypertensive heart disease    Iron deficiency anemia    Migraines    Myocardial infarction [facility])    Paroxysmal atrial fibrillation ([place])    a. s/p afib ablation 10/22/08 ([facility]).   PVC's (premature ventricular contractions)    Sleep apnea    [place]ional[place]: RDI during the total sleep time 6h 28 mins was 3.55/hr during REM sleep was at 5.71/hr. Supine AHI was 5.93/hr.   Syncope 08/2018    Patient Active Problem List   Diagnosis Date Noted   Chronic migraine without aura with status migrainosus, not intractable 01/27/2022   Primary osteoarthritis of first carpometacarpal joint of right hand 09/02/2021   Pain in right knee 02/12/2020   Second degree Mobitz II AV block 04/30/2019   Syncope and collapse 10/10/2018   Heart block AV complete ([place]) 10/10/2018   Symptomatic bradycardia 10/10/2018   Syncope, vasovagal 09/20/2018   Neck mass 10/14/2016   Depression 05/20/2016   Hypertensive heart disease    Paroxysmal atrial  fibrillation ([place])    Hyperlipidemia LDL goal <70 03/27/2014   Premature ventricular contraction 03/04/2014   Chest pain with high risk of acute coronary syndrome 06/28/2013   Morbid obesity ([place]) 06/28/2013   OSA (  obstructive sleep apnea) 12/18/2012   Sleepiness 11/06/2012   Fatigue 11/06/2012   PALPITATIONS 06/02/2009   DYSLIPIDEMIA 09/05/2008   Essential hypertension 09/05/2008   CAD S/P percutaneous coronary angioplasty 09/05/2008   RBBB 09/05/2008   SUPRAVENTRICULAR TACHYCARDIA 09/05/2008   ALLERGIC RHINITIS 09/05/2008   GERD 09/05/2008   BACK PAIN 09/05/2008   ARRHYTHMIA, HX OF 09/05/2008   MIGRAINES, HX OF 09/05/2008   Dyslipidemia 09/05/2008    Past Surgical History:  Procedure Laterality Date   ABDOMINAL HYSTERECTOMY     CARDIAC CATHETERIZATION  05/31/2010   The proximal LAD was then predilated with 2.0 x 12 trek. this was then stented with a 2.5 x 16 promus Element drug-eluting stent at 14 atmosphere and prostdilated with 2.75 x 12 [place] trek at 16 atmospheres(2.8 mm) resulting the reduction of 80% proximal LAD stenosis to 0% residual with excellent flow.   CARDIAC CATHETERIZATION  06/05/2010   Successful percutaneous coronary intervention to the right coronary artery with percutaneous transluminal coronary angioplasty/stenting and insertion 3.0 x 16 mm Promus DES post dilated to 3.35 mm with stenosis being reduced to 0%   CARDIAC CATHETERIZATION  02/29/2008   Post dilatation was performed using a 2.75 x 9 [place] sprinter, 10 atmospheres for 40 seconds and then 9 atmosphere for 35 sec. This resulted in the 80% area of narrowing pre-intervention, now appearing to normal. There was no edvidence of the dissection or thrombus and there was TIMI III flow pre and post.   CARDIAC CATHETERIZATION  02/28/2006   CORONARY ANGIOPLASTY WITH STENT PLACEMENT     3 stents/ 4 caths   EYE SURGERY     FINGER ARTHROPLASTY Right 09/02/2021   Procedure: RIGHT THUMB CARPOMETACARPAL ARTHROPLASTY;   Surgeon: Leandrew Koyanagi, MD;  Location: [place];  Service: Orthopedics;  Laterality: Right;  block in preop   KNEE ARTHROSCOPY     right   LASIK     LEFT HEART CATHETERIZATION WITH CORONARY ANGIOGRAM N/A 06/29/2013   Procedure: LEFT HEART CATHETERIZATION WITH CORONARY ANGIOGRAM;  Surgeon: Leonie Man, MD;  [facility] C[facility];  Service: Cardiovascular;  Laterality: N/A;   NASAL SINUS SURGERY  06/01/1999   NECK SURGERY     Cervical fusion   PACEMAKER IMPLANT N/A 05/01/2019   Procedure: PACEMAKER IMPLANT;  Surgeon: [name], MD;  Location: [place] CV LAB;  Service: Cardiovascular;  Laterality: N/A;   RADIOFREQUENCY ABLATION  09/28/2008   atrial fibrillation   ROTATOR CUFF REPAIR     left   SHOULDER OPEN ROTATOR CUFF REPAIR     right   SPINE SURGERY     WRIST ARTHROCENTESIS     left    Current Outpatient Medications  Medication Sig Dispense Refill   ACCU-CHEK GUIDE test strip USE TO CHECK BLOOD SUGAR LEVELS TWICE DAILY. 100 strip 1   acetaminophen (TYLENOL) 325 MG tablet Take 1-2 tablets (325-650 mg total) by mouth every 4 (four) hours as needed for mild pain.     amLODipine (NORVASC) 10 MG tablet TAKE 1 TABLET(10 MG) BY MOUTH DAILY 90 tablet 1   aspirin EC 81 MG tablet Take 81 mg by mouth at bedtime.     Blood Glucose Monitoring Suppl (ACCU-CHEK AVIVA PLUS) w/Device KIT Check BS bid E11.9 1 kit 1   cholecalciferol (VITAMIN D) 1000 UNITS tablet Take 1,000 Units by mouth at bedtime.      citalopram (CELEXA) 40 MG tablet TAKE 1 TABLET(40 MG) BY MOUTH DAILY 90 tablet 1   Coenzyme  Q10 (CO Q 10) 100 MG CAPS Take 100 mg by mouth daily with breakfast.      Dulaglutide (TRULICITY) 3 HQ/7.5FF SOPN Inject 3 mg as directed once a week. 2 mL 1   fexofenadine (ALLEGRA) 180 MG tablet Take 180 mg by mouth daily with breakfast.      fluticasone (FLONASE) 50 MCG/ACT nasal spray INSTILL 2 SPRAYS IN THE  NOSE AT BEDTIME 48 g 3   furosemide (LASIX) 40 MG tablet TAKE 1  TABLET BY MOUTH  DAILY 90 tablet 3   Galcanezumab-gnlm (EMGALITY) 120 MG/ML SOAJ Inject 120 mg into the skin every 30 (thirty) days. 1.12 mL 11   JARDIANCE 25 MG TABS tablet TAKE 1 TABLET BY MOUTH DAILY BEFORE BREAKFAST 90 tablet 1   Lancets Misc. (ACCU-CHEK FASTCLIX LANCET) KIT USE AS DIRECTED TO CHECK BLOOD SUGAR TWICE DAILY 1 kit 1   Multiple Vitamin (MULTIVITAMIN WITH MINERALS) TABS tablet Take 1 tablet by mouth daily with breakfast.     nitroGLYCERIN (NITROSTAT) 0.4 MG SL tablet PLACE ONE TABLET UNDER THE TONGUE EVERY 5 MINUTES AS NEEDED FOR CHEST PAIN 25 tablet 3   ondansetron (ZOFRAN) 4 MG tablet Take 4-8 mg by mouth every 8 (eight) hours as needed for nausea or vomiting.     oxyCODONE-acetaminophen (PERCOCET) 10-325 MG tablet Take 1 tablet by mouth every 8 (eight) hours as needed for pain. 21 tablet 0   pantoprazole (PROTONIX) 40 MG tablet TAKE 1 TABLET BY MOUTH  DAILY 90 tablet 3   Polyvinyl Alcohol-Povidone (REFRESH OP) Place 1 drop into both eyes at bedtime as needed (dry eyes).     potassium chloride (MICRO-K) 10 MEQ CR capsule TAKE 1 CAPSULE BY MOUTH  DAILY 90 capsule 3   Rimegepant Sulfate (NURTEC PO) Take by mouth.     rosuvastatin (CRESTOR) 40 MG tablet TAKE 1 TABLET BY MOUTH  DAILY 90 tablet 3   Study - CAPTIVA - aspirin 81 mg tablet (PI-Sethi) Chew by mouth.     telmisartan (MICARDIS) 40 MG tablet TAKE 1 TABLET BY MOUTH  DAILY 90 tablet 3   No current facility-administered medications for this visit.    Allergies as of 01/27/2022 - Review Complete 01/27/2022  Allergen Reaction Noted   Ibuprofen Swelling 07/18/2012   Other Shortness Of Breath 07/18/2012   Topamax [topiramate] Other (See Comments) 09/11/2015   Toprol xl [metoprolol succinate] Other (See Comments) 08/27/2010   Metoprolol-hctz er Other (See Comments) 08/29/2017    Vitals: BP 134/84   Pulse 90   Ht '5\' 8"'  (1.727 m)   Wt 220 lb 6.4 oz (100 kg)   BMI 33.51 kg/m  Last Weight:  Wt Readings from Last 1  Encounters:  01/27/22 220 lb 6.4 oz (100 kg)   Last Height:   Ht Readings from Last 1 Encounters:  01/27/22 '5\' 8"'  (1.727 m)     Physical exam: Exam: Gen: NAD, conversant, well nourised, obese, well groomed                     CV: RRR, no MRG. No Carotid Bruits. No peripheral edema, warm, nontender Eyes: Conjunctivae clear without exudates or hemorrhage  Neuro: Detailed Neurologic Exam  Speech:    Speech is normal; fluent and spontaneous with normal comprehension.  Cognition:    The patient is oriented to person, place, and time;     recent and remote memory intact;     language fluent;     normal attention, concentration,  fund of knowledge Cranial Nerves:    The pupils are equal, round, and reactive to light.  Attempted, pupils too small to visualize fundi.  Visual fields are full to finger confrontation. Extraocular movements are intact. Trigeminal sensation is intact and the muscles of mastication are normal. The face is symmetric. The palate elevates in the midline. Hearing intact. Voice is normal. Shoulder shrug is normal. The tongue has normal motion without fasciculations.   Coordination:    Normal   Gait:    normal.   Motor Observation:    No asymmetry, no atrophy, and no involuntary movements noted. Tone:    Normal muscle tone.    Posture:    Posture is normal. normal erect    Strength:    Strength is V/V in the upper and lower limbs.      Sensation: intact to LT     Reflex Exam:  DTR's:    Deep tendon reflexes in the upper and lower extremities are symmetrical bilaterally.   Toes:    The toes are equivocal bilaterally.   Clonus:    Clonus is absent.    Assessment/Plan: Patient with chronic migraines; 70 y.o. male here as requested by Susy Frizzle, MD for migraines..  Complicated past medical history including  Past medical history second-degree Mobitz 2 AV block, heart block complete, symptomatic bradycardia, vasovagal syncope, depression,  hypertensive heart disease, hypertension, paroxysmal A-fib, hyperlipidemia, morbid obesity, obstructive sleep apnea, CAD, COPD, depression, DM2, DJD, heart disease, MI, pacemaker, Afib s/p ablation, crdiac caths s/p stents, cervical fusion.  Reviewed notes from [facility], he has tried and failed multiple medications, being treated with Aimovig and Nurtec, using CPAP, they recommended weight loss and he has lost 40 pounds.  -Patient has chronic migraines for over 10 years.  He is tried and failed a plethora of medication (see above in HPI) he was started on Emgality which worked much better than Aimovig, Aimovig is not working as well as it used to, also his blood pressure has been elevated with can be a side effect to [place].  At this time we will switch back to [facility] to see if we can reduce his migraine and headache burden.  At baseline patient was having greater than 12 moderate to severe migraine days a month and greater than 20 total headache days a month.  With the [place] he has approximately 4 migraines a month but 15 total headache days a month, Emgality worked better, we will switch.  Continue Nurtec as needed.  -Patient does have extensive vascular risk factors, we did discuss that there has been some literature showing that CGRP are medications that may cause vasoconstriction, patient understands the risks, however these have not been formally added to the side effect profile by the FDA.  Change to Emgality but overlap with aimovig as I have seen Aimovig cause severe rebound headaches- emgality is once a month Nurtec: Increase to 16 a month and you can take it as needed, Also remember Nurtec lasts a long time so you can "pre-treat"  Follow up 12 weeks in office  Addendum: Patient is doing excellent on Emgality.  He is only having 4 migraines a month and less than 10 total headache days a month.  We will refill his Nurtec for acute management.4 migraine days a month. < 10 total  headache days a month. Failed mitrex, Nurtec, maxalt  Meds ordered this encounter  Medications   Galcanezumab-gnlm (EMGALITY) 120 MG/ML SOAJ    Sig: Inject  120 mg into the skin every 30 (thirty) days.    Dispense:  1.12 mL    Refill:  11   Rimegepant Sulfate 75 MG TBDP    Sig: Take 75 mg by mouth daily as needed (Migraine).    Dispense:  16 tablet    Refill:  11    4 migraine days a month. < 10 total headache days a month. Failed mitrex, Nurtec, maxalt     Cc: Pickard, Cammie Mcgee, MD,  Susy Frizzle, MD  Sarina Ill, MD  [facility] [address] [place], [place] [address]  Phone [phone] Fax [phone]  I spent over 60 minutes of face-to-face and non-face-to-face time with patient on the  1. Chronic migraine without aura with status migrainosus, not intractable    diagnosis.  This included previsit chart review, lab review, study review, order entry, electronic health record documentation, patient education on the different diagnostic and therapeutic options, counseling and coordination of care, risks and benefits of management, compliance, or risk factor reduction

## 2022-01-27 NOTE — Telephone Encounter (Signed)
Completed Emgality PA on Cover My Meds. Key: [id number]. Awaiting determination from [employer] Medicare Part D.

## 2022-01-28 ENCOUNTER — Ambulatory Visit (INDEPENDENT_AMBULATORY_CARE_PROVIDER_SITE_OTHER): Payer: Medicare Other | Admitting: Orthopaedic Surgery

## 2022-01-28 ENCOUNTER — Encounter: Payer: Self-pay | Admitting: Orthopaedic Surgery

## 2022-01-28 ENCOUNTER — Ambulatory Visit (INDEPENDENT_AMBULATORY_CARE_PROVIDER_SITE_OTHER): Payer: Medicare Other

## 2022-01-28 ENCOUNTER — Encounter: Payer: Self-pay | Admitting: Neurology

## 2022-01-28 DIAGNOSIS — M25522 Pain in left elbow: Secondary | ICD-10-CM | POA: Diagnosis not present

## 2022-01-28 NOTE — Telephone Encounter (Signed)
Order has been changed to [place] and emailed to April P with scheduling-they will contact pt to schedule appt

## 2022-01-28 NOTE — Progress Notes (Signed)
Office Visit Note   Patient: Lee [name]           Date of Birth: 05-Nov-1950           MRN: [id number] Visit Date: 01/28/2022              Requested by: Susy Frizzle, MD [address] [place],  [place] [address] PCP: Susy Frizzle, MD   Assessment & Plan: Visit Diagnoses:  1. Pain in left elbow     Plan: Impression is distal biceps injury.  Will need MRI to evaluate for rupture.  Follow-up after the MRI.  Follow-Up Instructions: No follow-ups on file.   Orders:  Orders Placed This Encounter  Procedures   XR Elbow 2 Views Left   No orders of the defined types were placed in this encounter.     Procedures: No procedures performed   Clinical Data: No additional findings.   Subjective: Chief Complaint  Patient presents with   Left Arm - Pain    HPI Lee [name] comes in with left elbow and arm pain for 3 weeks.  Picked up away and felt something pull.  Pain is worse with use of the arm.  Left-hand-dominant.  Denies any numbness and tingling.  Occasional throbby achiness at rest.  Review of Systems  Constitutional: Negative.   All other systems reviewed and are negative.    Objective: Vital Signs: There were no vitals taken for this visit.  Physical Exam Vitals and nursing note reviewed.  Constitutional:      Appearance: He is well-developed.  HENT:     Head: Normocephalic and atraumatic.  Eyes:     Pupils: Pupils are equal, round, and reactive to light.  Pulmonary:     Effort: Pulmonary effort is normal.  Abdominal:     Palpations: Abdomen is soft.  Musculoskeletal:        General: Normal range of motion.     Cervical back: Neck supple.  Skin:    General: Skin is warm.  Neurological:     Mental Status: He is alert and oriented to person, place, and time.  Psychiatric:        Behavior: Behavior normal.        Thought Content: Thought content normal.        Judgment: Judgment normal.     Ortho Exam Examination left elbow  shows full range of motion.  Collaterals feel stable.  No pain with resisted wrist extension or radial deviation.  Most of the pain is due to resisted supination.  Distal biceps tendon feels intact but very tender to palpation with hook test.  Specialty Comments:  No specialty comments available.  Imaging: No results found.   PMFS History: Patient Active Problem List   Diagnosis Date Noted   Pain in left elbow 01/28/2022   Chronic migraine without aura with status migrainosus, not intractable 01/27/2022   Primary osteoarthritis of first carpometacarpal joint of right hand 09/02/2021   Pain in right knee 02/12/2020   Second degree Mobitz II AV block 04/30/2019   Syncope and collapse 10/10/2018   Heart block AV complete ([place]) 10/10/2018   Symptomatic bradycardia 10/10/2018   Syncope, vasovagal 09/20/2018   Neck mass 10/14/2016   Depression 05/20/2016   Hypertensive heart disease    Paroxysmal atrial fibrillation ([place])    Hyperlipidemia LDL goal <70 03/27/2014   Premature ventricular contraction 03/04/2014   Chest pain with high risk of acute coronary syndrome 06/28/2013  Morbid obesity ([place]) 06/28/2013   OSA (obstructive sleep apnea) 12/18/2012   Sleepiness 11/06/2012   Fatigue 11/06/2012   PALPITATIONS 06/02/2009   DYSLIPIDEMIA 09/05/2008   Essential hypertension 09/05/2008   CAD S/P percutaneous coronary angioplasty 09/05/2008   RBBB 09/05/2008   SUPRAVENTRICULAR TACHYCARDIA 09/05/2008   ALLERGIC RHINITIS 09/05/2008   GERD 09/05/2008   BACK PAIN 09/05/2008   ARRHYTHMIA, HX OF 09/05/2008   MIGRAINES, HX OF 09/05/2008   Dyslipidemia 09/05/2008   Past Medical History:  Diagnosis Date   Allergic rhinitis    chronic sinusitis   Allergy    Arthritis    CAD (coronary artery disease)    a. 2009 PCI/DES to mLAD; b. 05/2010 s/p DES RCA and LAD.; c. 05/2013 Cath: LAD mild ISR, LCX nl, RCA 40p, patent stents.   Cataract    has had lasik surg   COPD (chronic obstructive  pulmonary disease) (HCC)    Depression    Diabetes mellitus type 2, controlled, without complications ([facility])    Diabetes mellitus without complication ([place])    Phreesia 08/22/2020   Diverticulosis    DJD (degenerative joint disease)    Esophagitis 1991   grade 1   GERD (gastroesophageal reflux disease)    Hiatal hernia   Hemorrhoids    Hyperlipidemia    Hypertension    Hypertensive heart disease    Iron deficiency anemia    Migraines    Myocardial infarction [facility])    Paroxysmal atrial fibrillation ([place])    a. s/p afib ablation 10/22/08 (J. Allred).   PVC's (premature ventricular contractions)    Sleep apnea    Questionalble: RDI during the total sleep time 6h 28 mins was 3.55/hr during REM sleep was at 5.71/hr. Supine AHI was 5.93/hr.   Syncope 08/2018    Family History  Problem Relation Age of Onset   Colon cancer Mother        mets from uterine   Uterine cancer Mother    Heart disease Father        and sister   Diabetes Father    Hearing loss Father    Stroke Father    Heart disease Sister    Lung cancer Sister    Stroke Brother     Past Surgical History:  Procedure Laterality Date   ABDOMINAL HYSTERECTOMY     CARDIAC CATHETERIZATION  05/31/2010   The proximal LAD was then predilated with 2.0 x 12 trek. this was then stented with a 2.5 x 16 promus Element drug-eluting stent at 14 atmosphere and prostdilated with 2.75 x 12 [place] trek at 16 atmospheres(2.8 mm) resulting the reduction of 80% proximal LAD stenosis to 0% residual with excellent flow.   CARDIAC CATHETERIZATION  06/05/2010   Successful percutaneous coronary intervention to the right coronary artery with percutaneous transluminal coronary angioplasty/stenting and insertion 3.0 x 16 mm Promus DES post dilated to 3.35 mm with stenosis being reduced to 0%   CARDIAC CATHETERIZATION  02/29/2008   Post dilatation was performed using a 2.75 x 9 [place] sprinter, 10 atmospheres for 40 seconds and then 9 atmosphere for 35 sec.  This resulted in the 80% area of narrowing pre-intervention, now appearing to normal. There was no edvidence of the dissection or thrombus and there was TIMI III flow pre and post.   CARDIAC CATHETERIZATION  02/28/2006   CORONARY ANGIOPLASTY WITH STENT PLACEMENT     3 stents/ 4 caths   EYE SURGERY     FINGER ARTHROPLASTY Right 09/02/2021   Procedure:  RIGHT THUMB CARPOMETACARPAL ARTHROPLASTY;  Surgeon: Leandrew Koyanagi, MD;  Location: [facility];  Service: Orthopedics;  Laterality: Right;  block in preop   KNEE ARTHROSCOPY     right   LASIK     LEFT HEART CATHETERIZATION WITH CORONARY ANGIOGRAM N/A 06/29/2013   Procedure: LEFT HEART CATHETERIZATION WITH CORONARY ANGIOGRAM;  Surgeon: Leonie Man, MD;  [facility] C[facility] LAB;  Service: Cardiovascular;  Laterality: N/A;   NASAL SINUS SURGERY  06/01/1999   NECK SURGERY     Cervical fusion   PACEMAKER IMPLANT N/A 05/01/2019   Procedure: PACEMAKER IMPLANT;  Surgeon: [name], MD;  Location: [place] CV LAB;  Service: Cardiovascular;  Laterality: N/A;   RADIOFREQUENCY ABLATION  09/28/2008   atrial fibrillation   ROTATOR CUFF REPAIR     left   SHOULDER OPEN ROTATOR CUFF REPAIR     right   SPINE SURGERY     WRIST ARTHROCENTESIS     left   Social History   Occupational History   Occupation: retired  Tobacco Use   Smoking status: Former    Packs/day: 2.00    Years: 25.00    Total pack years: 50.00    Types: Cigarettes, Cigars    Quit date: 04/22/1988    Years since quitting: 33.7   Smokeless tobacco: Never  Vaping Use   Vaping Use: Never used  Substance and Sexual Activity   Alcohol use: Not Currently   Drug use: No   Sexual activity: Not Currently    Birth control/protection: Surgical

## 2022-02-05 ENCOUNTER — Ambulatory Visit (HOSPITAL_COMMUNITY)
Admission: RE | Admit: 2022-02-05 | Discharge: 2022-02-05 | Disposition: A | Discharge status: Home / Self Care | Payer: Medicare Other | Source: Ambulatory Visit | Attending: Orthopaedic Surgery | Admitting: Orthopaedic Surgery

## 2022-02-05 DIAGNOSIS — M25522 Pain in left elbow: Secondary | ICD-10-CM | POA: Diagnosis present

## 2022-02-05 NOTE — Progress Notes (Signed)
Patient here today at [facility] for MRI left elbow wo contrast. Patient has St. Jude device. Transmission sent. Verbal orders from Andy-Cardiology PA for DOO 90. Will re-program once scan is completed and sent post transmission

## 2022-02-08 ENCOUNTER — Encounter: Payer: Self-pay | Admitting: Cardiovascular Disease

## 2022-02-08 NOTE — Progress Notes (Signed)
[facility] DEVICE PROGRAMMING  Patient Information: Name:  Lee [name]  DOB:  [birth year]  MRN:  [id number]    Planned Procedure:  Left Distal bicep tendon repair  Surgeon:  Dr Erlinda Hong  Date of Procedure:  02/10/22 12pm-1:45pm  Cautery will be used.Yes  Position during surgery:  Supine   Please send documentation back to:  [facility] (Fax # [phone])  Device Information:  Clinic EP Physician:  Dr. Renato Shin   Device Type:  Pacemaker Manufacturer and Phone #:  St. Jude/Abbott: [phone] Pacemaker Dependent?:  Yes.   Date of Last Device Check:  02/05/2022 Normal Device Function?:  Yes.    Electrophysiologist's Recommendations:  Have magnet available. Provide continuous ECG monitoring when magnet is used or reprogramming is to be performed.  Procedure will likely interfere with device function.  Device should be programmed:  Asynchronous pacing during procedure and returned to normal programming after procedure  Per Device Clinic Standing Orders, Simone Curia, RN  11:50 AM 02/08/2022

## 2022-02-08 NOTE — Progress Notes (Signed)
Electrophysiologist recommendation- PPM will need to be reprogrammed following procedure. Per discussions with Dr Roanna Banning, if  pacemaker will need a rep to reprogram, then case should be moved to the main OR. LVM with Dr [name] at Dr Phoebe Sharps office.

## 2022-02-09 ENCOUNTER — Ambulatory Visit (INDEPENDENT_AMBULATORY_CARE_PROVIDER_SITE_OTHER): Payer: Medicare Other | Admitting: Orthopaedic Surgery

## 2022-02-09 ENCOUNTER — Encounter: Payer: Self-pay | Admitting: Orthopaedic Surgery

## 2022-02-09 DIAGNOSIS — S46212A Strain of muscle, fascia and tendon of other parts of biceps, left arm, initial encounter: Secondary | ICD-10-CM | POA: Diagnosis not present

## 2022-02-09 NOTE — Progress Notes (Signed)
Office Visit Note   Patient: Lee [name]           Date of Birth: 05/08/1951           MRN: [id number] Visit Date: 02/09/2022              Requested by: Susy Frizzle, MD [address] [place],  [place] [address] PCP: Susy Frizzle, MD   Assessment & Plan: Visit Diagnoses:  1. Rupture of left distal biceps tendon, initial encounter     Plan: Herschel returns today to discuss left elbow MRI scan.  Examination left elbow is unchanged.  MRI does show a complete rupture of the distal biceps tendon with approximately 3 cm of retraction and surrounding edema.  Based on these findings I have recommended repair in the operating room.  Risk benefits prognosis reviewed.  Questions encouraged and answered.  Debbie met with the patient today.  Follow-Up Instructions: No follow-ups on file.   Orders:  No orders of the defined types were placed in this encounter.  No orders of the defined types were placed in this encounter.     Procedures: No procedures performed   Clinical Data: No additional findings.   Subjective: Chief Complaint  Patient presents with   Left Elbow - Follow-up    MRI review    HPI  Review of Systems   Objective: Vital Signs: There were no vitals taken for this visit.  Physical Exam  Ortho Exam  Specialty Comments:  No specialty comments available.  Imaging: No results found.   PMFS History: Patient Active Problem List   Diagnosis Date Noted   Rupture of left distal biceps tendon 02/09/2022   Pain in left elbow 01/28/2022   Chronic migraine without aura with status migrainosus, not intractable 01/27/2022   Primary osteoarthritis of first carpometacarpal joint of right hand 09/02/2021   Pain in right knee 02/12/2020   Second degree Mobitz II AV block 04/30/2019   Syncope and collapse 10/10/2018   Heart block AV complete ([place]) 10/10/2018   Symptomatic bradycardia 10/10/2018   Syncope, vasovagal 09/20/2018   Neck mass  10/14/2016   Depression 05/20/2016   Hypertensive heart disease    Paroxysmal atrial fibrillation ([place])    Hyperlipidemia LDL goal <70 03/27/2014   Premature ventricular contraction 03/04/2014   Chest pain with high risk of acute coronary syndrome 06/28/2013   Morbid obesity ([place]) 06/28/2013   OSA (obstructive sleep apnea) 12/18/2012   Sleepiness 11/06/2012   Fatigue 11/06/2012   PALPITATIONS 06/02/2009   DYSLIPIDEMIA 09/05/2008   Essential hypertension 09/05/2008   CAD S/P percutaneous coronary angioplasty 09/05/2008   RBBB 09/05/2008   SUPRAVENTRICULAR TACHYCARDIA 09/05/2008   ALLERGIC RHINITIS 09/05/2008   GERD 09/05/2008   BACK PAIN 09/05/2008   ARRHYTHMIA, HX OF 09/05/2008   MIGRAINES, HX OF 09/05/2008   Dyslipidemia 09/05/2008   Past Medical History:  Diagnosis Date   Allergic rhinitis    chronic sinusitis   Allergy    Arthritis    CAD (coronary artery disease)    a. 2009 PCI/DES to mLAD; b. 05/2010 s/p DES RCA and LAD.; c. 05/2013 Cath: LAD mild ISR, LCX nl, RCA 40p, patent stents.   Cataract    has had lasik surg   COPD (chronic obstructive pulmonary disease) (HCC)    Depression    Diabetes mellitus type 2, controlled, without complications ([place])    Diabetes mellitus without complication ([place])    Phreesia 08/22/2020   Diverticulosis  DJD (degenerative joint disease)    Esophagitis 1991   grade 1   GERD (gastroesophageal reflux disease)    Hiatal hernia   Hemorrhoids    Hyperlipidemia    Hypertension    Hypertensive heart disease    Iron deficiency anemia    Migraines    Myocardial infarction [facility])    Paroxysmal atrial fibrillation ([place])    a. s/p afib ablation 10/22/08 ([facility]. [facility]).   PVC's (premature ventricular contractions)    Sleep apnea    Questionalble: RDI during the total sleep time 6h 28 mins was 3.55/hr during REM sleep was at 5.71/hr. Supine AHI was 5.93/hr.   Syncope 08/2018    Family History  Problem Relation Age of Onset   Colon cancer  Mother        mets from uterine   Uterine cancer Mother    Heart disease Father        and sister   Diabetes Father    Hearing loss Father    Stroke Father    Heart disease Sister    Lung cancer Sister    Stroke Brother     Past Surgical History:  Procedure Laterality Date   ABDOMINAL HYSTERECTOMY     CARDIAC CATHETERIZATION  05/31/2010   The proximal LAD was then predilated with 2.0 x 12 trek. this was then stented with a 2.5 x 16 promus Element drug-eluting stent at 14 atmosphere and prostdilated with 2.75 x 12 [place] trek at 16 atmospheres(2.8 mm) resulting the reduction of 80% proximal LAD stenosis to 0% residual with excellent flow.   CARDIAC CATHETERIZATION  06/05/2010   Successful percutaneous coronary intervention to the right coronary artery with percutaneous transluminal coronary angioplasty/stenting and insertion 3.0 x 16 mm Promus DES post dilated to 3.35 mm with stenosis being reduced to 0%   CARDIAC CATHETERIZATION  02/29/2008   Post dilatation was performed using a 2.75 x 9 [place] sprinter, 10 atmospheres for 40 seconds and then 9 atmosphere for 35 sec. This resulted in the 80% area of narrowing pre-intervention, now appearing to normal. There was no edvidence of the dissection or thrombus and there was TIMI III flow pre and post.   CARDIAC CATHETERIZATION  02/28/2006   CORONARY ANGIOPLASTY WITH STENT PLACEMENT     3 stents/ 4 caths   EYE SURGERY     FINGER ARTHROPLASTY Right 09/02/2021   Procedure: RIGHT THUMB CARPOMETACARPAL ARTHROPLASTY;  Surgeon: Leandrew Koyanagi, MD;  Location: [facility] [place];  Service: Orthopedics;  Laterality: Right;  block in preop   KNEE ARTHROSCOPY     right   LASIK     LEFT HEART CATHETERIZATION WITH CORONARY ANGIOGRAM N/A 06/29/2013   Procedure: LEFT HEART CATHETERIZATION WITH CORONARY ANGIOGRAM;  Surgeon: Leonie Man, MD;  Location: [facility] CATH LAB;  Service: Cardiovascular;  Laterality: N/A;   NASAL SINUS SURGERY  06/01/1999   NECK SURGERY      Cervical fusion   PACEMAKER IMPLANT N/A 05/01/2019   Procedure: PACEMAKER IMPLANT;  Surgeon: [name], MD;  Location: [facility] CV LAB;  Service: Cardiovascular;  Laterality: N/A;   RADIOFREQUENCY ABLATION  09/28/2008   atrial fibrillation   ROTATOR CUFF REPAIR     left   SHOULDER OPEN ROTATOR CUFF REPAIR     right   SPINE SURGERY     WRIST ARTHROCENTESIS     left   Social History   Occupational History   Occupation: retired  Tobacco Use   Smoking status: Former  Packs/day: 2.00    Years: 25.00    Total pack years: 50.00    Types: Cigarettes, Cigars    Quit date: 04/22/1988    Years since quitting: 33.8   Smokeless tobacco: Never  Vaping Use   Vaping Use: Never used  Substance and Sexual Activity   Alcohol use: Not Currently   Drug use: No   Sexual activity: Not Currently    Birth control/protection: Surgical

## 2022-02-11 ENCOUNTER — Other Ambulatory Visit: Payer: Self-pay

## 2022-02-11 ENCOUNTER — Encounter (HOSPITAL_COMMUNITY): Payer: Self-pay | Admitting: Orthopaedic Surgery

## 2022-02-11 NOTE — Progress Notes (Signed)
PCP - Dr Jenna Luo Cardiologist - Dr Shelva Majestic EP Physician: Dr Renato Shin Neurology - Dr Sarina Ill  Chest x-ray - n/a EKG - 11/10/21 Stress Test - 03/07/20 ECHO - 09/23/21 Cardiac Cath - 06/29/13  ICD Pacemaker - Yes.  Perioperative prescription for implanted cardiac device programming was submitted on 02/08/22.  St. Jude/Abbott [phone]) has been notified of surgery date and time.  Last remote device check was on 02/05/22.    Sleep Study -  Yes CPAP - uses nightly  Last dose of Trulicity was on 7/0/35.  Do not take Jardiance on the the morning of surgery. (Hold Jardiance for 72 hours prior to surgery but this is a SDW call on 02/11/22).  If your blood sugar is less than 70 mg/dL, you will need to treat for low blood sugar: Treat a low blood sugar (less than 70 mg/dL) with  cup of clear juice (cranberry or apple), 4 glucose tablets, OR glucose gel. Recheck blood sugar in 15 minutes after treatment (to make sure it is greater than 70 mg/dL). If your blood sugar is not greater than 70 mg/dL on recheck, call [phone] for further instructions.  Aspirin Instructions: Follow your surgeon's instructions on when to stop aspirin prior to surgery,  If no instructions were given by your surgeon then you will need to call the office for those instructions.  ERAS: Clear liquids til 9:30 AM on DOS.  Anesthesia review: Yes  STOP now taking Aleve, Naproxen, Ibuprofen, Motrin, Advil, Goody's, BC's, all herbal medications, fish oil, and all vitamins.   Coronavirus Screening Do you have any of the following symptoms:  Cough yes/no: No Fever (>100.25F)  yes/no: No Runny nose yes/no: No Sore throat yes/no: No Difficulty breathing/shortness of breath  yes/no: No  Have you traveled in the last 14 days and where? yes/no: No  Patient verbalized understanding of instructions that were given via phone.

## 2022-02-11 NOTE — Progress Notes (Signed)
Anesthesia Chart Review: Lee [name]  Case: [id number] Date/Time: 02/12/22 1510   Procedure: LEFT DISTAL BICEPS TENDON REPAIR (Left)   Anesthesia type: General   Pre-op diagnosis: left distal biceps rupture   Location: [facility] OR ROOM 02 / [place] [facility]   Surgeons: Leandrew Koyanagi, MD       DISCUSSION: Patient is a 71 year old male scheduled for the above procedure. Case previously scheduled/reviewed at [facility], but he has a PPM with EP recommendation that procedure will likely interfere with device function so device should be programmed asynchronous pacing during procedure and return to normal program after procedure.  Subsequently case moved to [facility] main OR. He is pacer dependent.  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   Last EP cardiology follow-up was on 11/10/21 with Tommye Standard, PA-C. She notes he was doing well. He intermittently feels PVCs, but not enough to want to adjust medications. He was exercising regularly, either walking outside or going to a fitness center with his wife and using the bike or rowing machine. No syncope. PPM functioning normally, pacer dependent. VIP programmed off. Afib burden < 1%., off anticoagulation therapy. No CAD symptoms with scheduled follow-up with Dr. Claiborne Billings in the Fall (03/29/22). He had a virtual preoperative visit with Leanor Kail, PA on 08/05/21 prior to thumb surgery. One year EP follow-up planned.    EP Perioperative PPM  recommendations: Device Information: Clinic EP Physician:  Dr. Renato Shin  Device Type:  Pacemaker Manufacturer and Phone #:  St. Jude/Abbott: [phone] Pacemaker Dependent?:  Yes.   Date of Last Device Check:  02/05/2022         Normal Device Function?:  Yes.     Electrophysiologist's Recommendations: Have magnet available. Provide continuous ECG monitoring when magnet is used or reprogramming is to be performed.  Procedure will likely interfere with device function.  Device should be programmed:  Asynchronous pacing during procedure and returned to normal programming after procedure  He is on Trulicity weekly, on Tuesday by notes. Case is a late add-on to [place] for tomorrow (02/12/22) after being moved from [facility]. PAT phone RN will confirm last dose and provide instructions to hold until after surgery when she calls to review preoperative instructions. Case is posted for general anesthesia.   Anesthesia team to evaluate on the day of surgery.    VS:  BP Readings from Last 3 Encounters:  01/27/22 134/84  12/07/21 (!) 138/98  11/10/21 130/80   Pulse Readings from Last 3 Encounters:  01/27/22 90  12/07/21 86  11/10/21 75     PROVIDERS: Susy Frizzle, MD is PCP  Mealor, Nila Nephew, MD is newly assigned EP, previously [name], MD  Shelva Majestic, MD is primary cardiologist Sarina Ill, MD is neurologist (migraines)   LABS: For day of surgery as indicated. Last results in [facility] include: Lab Results  Component Value Date   WBC 8.4 12/07/2021   HGB 15.6 12/07/2021   HCT 47.3 12/07/2021   PLT 261 12/07/2021   GLUCOSE 186 (H) 12/07/2021   ALT 13 12/07/2021   AST 14 12/07/2021  NA 136 12/07/2021   K 4.5 12/07/2021   CL 100 12/07/2021   CREATININE 0.89 12/07/2021   BUN 13 12/07/2021   CO2 27 12/07/2021   HGBA1C 6.0 (H) 12/07/2021   MICROALBUR 0.2 12/07/2021     IMAGES: MRI Left Elbow 02/05/22: IMPRESSION: 1. Complete tear of the distal biceps  tendon with approximately 3 cm of tendon retraction. 2. Moderate tendinosis of the common extensor tendon origin with low-grade interstitial tearing. 3. Mild-moderate osteoarthritis of the left elbow.    EKG: 11/10/21: V-paced rhythm   CV: Echo 09/23/21: IMPRESSIONS   1. Left ventricular ejection fraction, by estimation, is 55 to 60%. The  left ventricle has normal function. The left ventricle has no regional  wall motion abnormalities. Left ventricular diastolic parameters are  consistent with Grade I diastolic  dysfunction (impaired relaxation).   2. Right ventricular systolic function is normal. The right ventricular  size is normal. Tricuspid regurgitation signal is inadequate for assessing  PA pressure.   3. The mitral valve is normal in structure. Trivial mitral valve  regurgitation. No evidence of mitral stenosis.   4. The aortic valve has an indeterminant number of cusps. Aortic valve  regurgitation is not visualized. Mild to moderate aortic valve stenosis.   5. The inferior vena cava is normal in size with greater than 50%  respiratory variability, suggesting right atrial pressure of 3 mmHg.    Nuclear stress test 03/07/20: The left ventricular ejection fraction is mildly decreased (45-54%). Nuclear stress EF: 54%. There was no ST segment deviation noted during stress. Defect 1: There is a large defect of moderate severity present in the basal inferior, mid inferior, apical inferior and apex location. This is an intermediate risk study.   There is a large defect of moderate severity throughout the entire inferior wall. This is unchanged between stress and rest images. There is normal wall motion in this area. There is significant extracardiac activity in the area as well. Taken together, this highly suggests artifact, but fixed defect cannot be excluded. No evidence of reversible ischemia.   Cardiac event monitor 09/25/18-10/24/18: The patient was monitored for 27 through  Oct 24, 2018.  The predominant rhythm was sinus with an average rate at 77 bpm.  Slowest heart rate recorded was 21 bpm on May 12 and the fastest heart rate was sinus tachycardia at140 bpm on May 23.  He had periods of first-degree block, bradycardia with PVCs at 5 pauses greater than 3 seconds.  On May 12 he developed third-degree heart block with escape 30 bpm and also an episode of idioventricular escape rhythm with rate at 21 bpm.  He had also developed an episode of 6 beats of ventricular tachycardia. The patient was subsequently evaluated by Dr. Rayann Heman on 10/11/2018.   Cardiac cath 06/29/13: Left Ventriculography: EF: 65 % Wall Motion: Normal   Coronary Anatomy: Left Main: Large-caliber vessel that bifurcates into the LAD, and Circumflex. Angiographically normal. LAD: Large-caliber vessel with patent stent proximally with a roughly 20% irregular in-stent re-stenosis. The mid stent also had tubular 40% in-stent restenosis. The remainder the vessel is relatively free of disease it wraps the apex. D1: Small-caliber vessel, jailed by the stent. No significant lesions.   Left Circumflex: Large-caliber vessel, angiographically normal. It actually courses as a large lateral OM. They're several small branches of the small AV groove branch   RCA: Large caliber, dominant vessel with a roughly 40% narrowing just proximal to the widely patent stent in the mid vessel the  it then courses down around 2 terminates as The Right Posterior Descending Artery prior to giving off the Right Posterior AV Groove Branch (RPAV). Beyond the patent stent, no significant lesions noted. RPDA: Moderate-caliber vessel that reaches almost to the apex. Tortuous but free of disease. RPL Sysytem:The RPAV moderate caliber vessel that terminates as a single posterior lateral branches several small branches. Minimal luminal irregularity.   POST-OPERATIVE DIAGNOSIS:   No angiographic evidence of significant CAD. If there is concern  for follow on symptoms, would consider Myoview stress test to assess the LAD stent in stent restenosis. Preserved EF with elevated EDP.   Past Medical History:  Diagnosis Date   Allergic rhinitis    chronic sinusitis   Allergy    Arthritis    CAD (coronary artery disease)    a. 2009 PCI/DES to mLAD; b. 05/2010 s/p DES RCA and LAD.; c. 05/2013 Cath: LAD mild ISR, LCX nl, RCA 40p, patent stents.   Cataract    has had lasik surg   COPD (chronic obstructive pulmonary disease) (HCC)    Depression    Diabetes mellitus type 2, controlled, without complications ([place])    Diabetes mellitus without complication ([place])    Phreesia 08/22/2020   Diverticulosis    DJD (degenerative joint disease)    Esophagitis 1991   grade 1   GERD (gastroesophageal reflux disease)    Hiatal hernia   Hemorrhoids    Hyperlipidemia    Hypertension    Hypertensive heart disease    Iron deficiency anemia    Migraines    Myocardial infarction [facility])    Paroxysmal atrial fibrillation ([place])    a. s/p afib ablation 10/22/08 ([facility]).   PVC's (premature ventricular contractions)    Sleep apnea    Questionalble: RDI during the total sleep time 6h 28 mins was 3.55/hr during REM sleep was at 5.71/hr. Supine AHI was 5.93/hr.   Syncope 08/2018    Past Surgical History:  Procedure Laterality Date   ABDOMINAL HYSTERECTOMY     CARDIAC CATHETERIZATION  05/31/2010   The proximal LAD was then predilated with 2.0 x 12 trek. this was then stented with a 2.5 x 16 promus Element drug-eluting stent at 14 atmosphere and prostdilated with 2.75 x 12 [place] trek at 16 atmospheres(2.8 mm) resulting the reduction of 80% proximal LAD stenosis to 0% residual with excellent flow.   CARDIAC CATHETERIZATION  06/05/2010   Successful percutaneous coronary intervention to the right coronary artery with percutaneous transluminal coronary angioplasty/stenting and insertion 3.0 x 16 mm Promus DES post dilated to 3.35 mm with stenosis being reduced  to 0%   CARDIAC CATHETERIZATION  02/29/2008   Post dilatation was performed using a 2.75 x 9 [place] sprinter, 10 atmospheres for 40 seconds and then 9 atmosphere for 35 sec. This resulted in the 80% area of narrowing pre-intervention, now appearing to normal. There was no edvidence of the dissection or thrombus and there was TIMI III flow pre and post.   CARDIAC CATHETERIZATION  02/28/2006   CORONARY ANGIOPLASTY WITH STENT PLACEMENT     3 stents/ 4 caths   EYE SURGERY     FINGER ARTHROPLASTY Right 09/02/2021   Procedure: RIGHT THUMB CARPOMETACARPAL ARTHROPLASTY;  Surgeon: Leandrew Koyanagi, MD;  Location: [place];  Service: Orthopedics;  Laterality: Right;  block in preop   KNEE ARTHROSCOPY     right   LASIK     LEFT HEART CATHETERIZATION WITH CORONARY ANGIOGRAM N/A 06/29/2013   Procedure: LEFT  HEART CATHETERIZATION WITH CORONARY ANGIOGRAM;  Surgeon: Leonie Man, MD;  Location: [facility] CATH LAB;  Service: Cardiovascular;  Laterality: N/A;   NASAL SINUS SURGERY  06/01/1999   NECK SURGERY     Cervical fusion   PACEMAKER IMPLANT N/A 05/01/2019   Procedure: PACEMAKER IMPLANT;  Surgeon: [name], MD;  Location: [place] CV LAB;  Service: Cardiovascular;  Laterality: N/A;   RADIOFREQUENCY ABLATION  09/28/2008   atrial fibrillation   ROTATOR CUFF REPAIR     left   SHOULDER OPEN ROTATOR CUFF REPAIR     right   SPINE SURGERY     WRIST ARTHROCENTESIS     left    MEDICATIONS: No current facility-administered medications for this encounter.    acetaminophen (TYLENOL) 325 MG tablet   amLODipine (NORVASC) 10 MG tablet   aspirin EC 81 MG tablet   cholecalciferol (VITAMIN D) 1000 UNITS tablet   citalopram (CELEXA) 40 MG tablet   Coenzyme Q10 (CO Q 10) 100 MG CAPS   Dulaglutide (TRULICITY) 3 PZ/9.6UG SOPN   fexofenadine (ALLEGRA) 180 MG tablet   fluticasone (FLONASE) 50 MCG/ACT nasal spray   furosemide (LASIX) 40 MG tablet   Galcanezumab-gnlm (EMGALITY) 120 MG/ML SOAJ    JARDIANCE 25 MG TABS tablet   Multiple Vitamin (MULTIVITAMIN WITH MINERALS) TABS tablet   nitroGLYCERIN (NITROSTAT) 0.4 MG SL tablet   oxyCODONE-acetaminophen (PERCOCET) 10-325 MG tablet   pantoprazole (PROTONIX) 40 MG tablet   Polyvinyl Alcohol-Povidone (REFRESH OP)   potassium chloride (MICRO-K) 10 MEQ CR capsule   Rimegepant Sulfate 75 MG TBDP   rosuvastatin (CRESTOR) 40 MG tablet   telmisartan (MICARDIS) 40 MG tablet   ACCU-CHEK GUIDE test strip   Blood Glucose Monitoring Suppl (ACCU-CHEK AVIVA PLUS) w/Device KIT   Lancets Misc. (ACCU-CHEK FASTCLIX LANCET) KIT    Myra Gianotti, PA-C Surgical Short Stay/Anesthesiology [facility] Phone [phone] [facility] Phone [phone] 02/11/2022 10:45 AM

## 2022-02-11 NOTE — Anesthesia Preprocedure Evaluation (Signed)
Anesthesia Evaluation  Patient identified by MRN, date of birth, ID band Patient awake    Reviewed: Allergy & Precautions, NPO status , Patient's Chart, lab work & pertinent test results  Airway Mallampati: II  TM Distance: >3 FB Neck ROM: Full    Dental  (+) Dental Advisory Given   Pulmonary sleep apnea and Continuous Positive Airway Pressure Ventilation , COPD, former smoker,    breath sounds clear to auscultation       Cardiovascular hypertension, Pt. on medications + CAD and + Cardiac Stents  + dysrhythmias Atrial Fibrillation  Rhythm:Regular Rate:Normal     Neuro/Psych  Headaches,    GI/Hepatic Neg liver ROS, GERD  ,  Endo/Other  diabetes, Type 2  Renal/GU negative Renal ROS     Musculoskeletal  (+) Arthritis ,   Abdominal   Peds  Hematology  (+) Blood dyscrasia, anemia ,   Anesthesia Other Findings   Reproductive/Obstetrics                            Lab Results  Component Value Date   WBC 7.3 02/12/2022   HGB 15.6 02/12/2022   HCT 47.1 02/12/2022   MCV 89.7 02/12/2022   PLT 247 02/12/2022   Lab Results  Component Value Date   CREATININE 0.74 02/12/2022   BUN 14 02/12/2022   NA 136 02/12/2022   K 4.5 02/12/2022   CL 101 02/12/2022   CO2 23 02/12/2022    Anesthesia Physical Anesthesia Plan  ASA: 3  Anesthesia Plan: General   Post-op Pain Management: Regional block* and Tylenol PO (pre-op)*   Induction: Intravenous  PONV Risk Score and Plan: 2 and Dexamethasone, Ondansetron and Treatment may vary due to age or medical condition  Airway Management Planned: Oral ETT  Additional Equipment: None  Intra-op Plan:   Post-operative Plan: Extubation in OR  Informed Consent: I have reviewed the patients History and Physical, chart, labs and discussed the procedure including the risks, benefits and alternatives for the proposed anesthesia with the patient or authorized  representative who has indicated his/her understanding and acceptance.     Dental advisory given  Plan Discussed with: CRNA  Anesthesia Plan Comments: (PAT note written 02/11/2022 by Shonna Chock, PA-C. )       Anesthesia Quick Evaluation

## 2022-02-12 ENCOUNTER — Ambulatory Visit (HOSPITAL_COMMUNITY)
Admission: RE | Admit: 2022-02-12 | Discharge: 2022-02-12 | Disposition: A | Discharge status: Home / Self Care | Payer: Medicare Other | Attending: Orthopaedic Surgery | Admitting: Orthopaedic Surgery

## 2022-02-12 ENCOUNTER — Ambulatory Visit (HOSPITAL_BASED_OUTPATIENT_CLINIC_OR_DEPARTMENT_OTHER): Payer: Medicare Other | Admitting: Vascular Surgery

## 2022-02-12 ENCOUNTER — Encounter (HOSPITAL_COMMUNITY): Payer: Self-pay | Admitting: Orthopaedic Surgery

## 2022-02-12 ENCOUNTER — Other Ambulatory Visit: Payer: Self-pay

## 2022-02-12 ENCOUNTER — Encounter (HOSPITAL_COMMUNITY)
Admission: RE | Disposition: A | Discharge status: Home / Self Care | Payer: Self-pay | Source: Home / Self Care | Attending: Orthopaedic Surgery

## 2022-02-12 ENCOUNTER — Ambulatory Visit (HOSPITAL_COMMUNITY): Payer: Medicare Other

## 2022-02-12 ENCOUNTER — Ambulatory Visit (HOSPITAL_COMMUNITY): Payer: Medicare Other | Admitting: Vascular Surgery

## 2022-02-12 DIAGNOSIS — S46212A Strain of muscle, fascia and tendon of other parts of biceps, left arm, initial encounter: Secondary | ICD-10-CM

## 2022-02-12 DIAGNOSIS — Z950 Presence of cardiac pacemaker: Secondary | ICD-10-CM | POA: Diagnosis not present

## 2022-02-12 DIAGNOSIS — I252 Old myocardial infarction: Secondary | ICD-10-CM | POA: Diagnosis not present

## 2022-02-12 DIAGNOSIS — Z7985 Long-term (current) use of injectable non-insulin antidiabetic drugs: Secondary | ICD-10-CM | POA: Insufficient documentation

## 2022-02-12 DIAGNOSIS — Z79899 Other long term (current) drug therapy: Secondary | ICD-10-CM | POA: Insufficient documentation

## 2022-02-12 DIAGNOSIS — Z7984 Long term (current) use of oral hypoglycemic drugs: Secondary | ICD-10-CM | POA: Diagnosis not present

## 2022-02-12 DIAGNOSIS — G4733 Obstructive sleep apnea (adult) (pediatric): Secondary | ICD-10-CM | POA: Insufficient documentation

## 2022-02-12 DIAGNOSIS — I2510 Atherosclerotic heart disease of native coronary artery without angina pectoris: Secondary | ICD-10-CM

## 2022-02-12 DIAGNOSIS — Z955 Presence of coronary angioplasty implant and graft: Secondary | ICD-10-CM | POA: Insufficient documentation

## 2022-02-12 DIAGNOSIS — I10 Essential (primary) hypertension: Secondary | ICD-10-CM | POA: Diagnosis not present

## 2022-02-12 DIAGNOSIS — E785 Hyperlipidemia, unspecified: Secondary | ICD-10-CM | POA: Insufficient documentation

## 2022-02-12 DIAGNOSIS — Z87891 Personal history of nicotine dependence: Secondary | ICD-10-CM | POA: Insufficient documentation

## 2022-02-12 DIAGNOSIS — E119 Type 2 diabetes mellitus without complications: Secondary | ICD-10-CM | POA: Diagnosis not present

## 2022-02-12 DIAGNOSIS — X58XXXA Exposure to other specified factors, initial encounter: Secondary | ICD-10-CM | POA: Insufficient documentation

## 2022-02-12 HISTORY — PX: DISTAL BICEPS TENDON REPAIR: SHX1461

## 2022-02-12 LAB — BASIC METABOLIC PANEL
Anion gap: 12 (ref 5–15)
BUN: 14 mg/dL (ref 8–23)
CO2: 23 mmol/L (ref 22–32)
Calcium: 9.6 mg/dL (ref 8.9–10.3)
Chloride: 101 mmol/L (ref 98–111)
Creatinine, Ser: 0.74 mg/dL (ref 0.61–1.24)
GFR, Estimated: >60 mL/min (ref >60)
Glucose, Bld: 116 mg/dL — ABNORMAL HIGH (ref 70–99)
Potassium: 4.5 mmol/L (ref 3.5–5.1)
Sodium: 136 mmol/L (ref 135–145)

## 2022-02-12 LAB — GLUCOSE, CAPILLARY
Glucose-Capillary: 129 mg/dL — ABNORMAL HIGH (ref 70–99)
Glucose-Capillary: 92 mg/dL (ref 70–99)
Glucose-Capillary: 115 mg/dL — ABNORMAL HIGH (ref 70–99)

## 2022-02-12 LAB — CBC
HCT: 47.1 % (ref 39.0–52.0)
Hemoglobin: 15.6 g/dL (ref 13.0–17.0)
MCH: 29.7 pg (ref 26.0–34.0)
MCHC: 33.1 g/dL (ref 30.0–36.0)
MCV: 89.7 fL (ref 80.0–100.0)
Platelets: 247 10*3/uL (ref 150–400)
RBC: 5.25 MIL/uL (ref 4.22–5.81)
RDW: 12.5 % (ref 11.5–15.5)
WBC: 7.3 10*3/uL (ref 4.0–10.5)
nRBC: 0 % (ref 0.0–0.2)

## 2022-02-12 SURGERY — REPAIR, TENDON, BICEPS, DISTAL
Anesthesia: General | Site: Arm Upper | Laterality: Left

## 2022-02-12 MED ORDER — PROPOFOL 10 MG/ML IV BOLUS
INTRAVENOUS | Status: DC | PRN
  Administered 2022-02-12: 80 mg via INTRAVENOUS
  Administered 2022-02-12: 120 mg via INTRAVENOUS

## 2022-02-12 MED ORDER — SUGAMMADEX SODIUM 200 MG/2ML IV SOLN
INTRAVENOUS | Status: DC | PRN
  Administered 2022-02-12: 200 mg via INTRAVENOUS

## 2022-02-12 MED ORDER — ACETAMINOPHEN 500 MG PO TABS
1000.0000 mg | ORAL_TABLET | Freq: Once | ORAL | Status: AC
  Administered 2022-02-12: 1000 mg via ORAL
  Filled 2022-02-12: qty 2

## 2022-02-12 MED ORDER — FENTANYL CITRATE (PF) 250 MCG/5ML IJ SOLN
INTRAMUSCULAR | Status: AC
  Filled 2022-02-12: qty 5

## 2022-02-12 MED ORDER — CHLORHEXIDINE GLUCONATE 0.12 % MT SOLN
15.0000 mL | Freq: Once | OROMUCOSAL | Status: AC
  Administered 2022-02-12: 15 mL via OROMUCOSAL
  Filled 2022-02-12: qty 15

## 2022-02-12 MED ORDER — AMISULPRIDE (ANTIEMETIC) 5 MG/2ML IV SOLN: 10.0000 mg | Freq: Once | INTRAVENOUS | Status: DC | PRN

## 2022-02-12 MED ORDER — BUPIVACAINE-EPINEPHRINE (PF) 0.5% -1:200000 IJ SOLN
INTRAMUSCULAR | Status: DC | PRN
  Administered 2022-02-12: 30 mL via PERINEURAL

## 2022-02-12 MED ORDER — LACTATED RINGERS IV SOLN: INTRAVENOUS | Status: DC

## 2022-02-12 MED ORDER — ONDANSETRON HCL 4 MG/2ML IJ SOLN
INTRAMUSCULAR | Status: DC | PRN
  Administered 2022-02-12: 4 mg via INTRAVENOUS

## 2022-02-12 MED ORDER — SUCCINYLCHOLINE CHLORIDE 200 MG/10ML IV SOSY
PREFILLED_SYRINGE | INTRAVENOUS | Status: AC
  Filled 2022-02-12: qty 10

## 2022-02-12 MED ORDER — CEFAZOLIN SODIUM-DEXTROSE 2-3 GM-%(50ML) IV SOLR
INTRAVENOUS | Status: DC | PRN
  Administered 2022-02-12: 2 g via INTRAVENOUS

## 2022-02-12 MED ORDER — ROCURONIUM BROMIDE 10 MG/ML (PF) SYRINGE
PREFILLED_SYRINGE | INTRAVENOUS | Status: AC
  Filled 2022-02-12: qty 10

## 2022-02-12 MED ORDER — OXYCODONE-ACETAMINOPHEN 5-325 MG PO TABS: 1.0000 | ORAL_TABLET | Freq: Two times a day (BID) | ORAL | 0 refills | Status: DC | PRN

## 2022-02-12 MED ORDER — FENTANYL CITRATE (PF) 100 MCG/2ML IJ SOLN: 25.0000 ug | INTRAMUSCULAR | Status: DC | PRN

## 2022-02-12 MED ORDER — DEXAMETHASONE SODIUM PHOSPHATE 10 MG/ML IJ SOLN
INTRAMUSCULAR | Status: DC | PRN
  Administered 2022-02-12: 10 mg via INTRAVENOUS

## 2022-02-12 MED ORDER — 0.9 % SODIUM CHLORIDE (POUR BTL) OPTIME
TOPICAL | Status: DC | PRN
  Administered 2022-02-12: 1000 mL

## 2022-02-12 MED ORDER — INSULIN ASPART 100 UNIT/ML IJ SOLN: 0.0000 [IU] | INTRAMUSCULAR | Status: DC | PRN

## 2022-02-12 MED ORDER — MIDAZOLAM HCL 2 MG/2ML IJ SOLN: 1.0000 mg | Freq: Once | INTRAMUSCULAR | Status: AC

## 2022-02-12 MED ORDER — ROCURONIUM BROMIDE 10 MG/ML (PF) SYRINGE
PREFILLED_SYRINGE | INTRAVENOUS | Status: DC | PRN
  Administered 2022-02-12: 40 mg via INTRAVENOUS

## 2022-02-12 MED ORDER — FENTANYL CITRATE (PF) 100 MCG/2ML IJ SOLN
INTRAMUSCULAR | Status: AC
  Administered 2022-02-12: 50 ug via INTRAVENOUS
  Filled 2022-02-12: qty 2

## 2022-02-12 MED ORDER — CLONIDINE HCL (ANALGESIA) 100 MCG/ML EP SOLN
EPIDURAL | Status: DC | PRN
  Administered 2022-02-12: 50 ug

## 2022-02-12 MED ORDER — CEFAZOLIN SODIUM-DEXTROSE 2-4 GM/100ML-% IV SOLN
2.0000 g | INTRAVENOUS | Status: DC
  Filled 2022-02-12: qty 100

## 2022-02-12 MED ORDER — MIDAZOLAM HCL 2 MG/2ML IJ SOLN
INTRAMUSCULAR | Status: AC
  Filled 2022-02-12: qty 2

## 2022-02-12 MED ORDER — ORAL CARE MOUTH RINSE: 15.0000 mL | Freq: Once | OROMUCOSAL | Status: AC

## 2022-02-12 MED ORDER — FENTANYL CITRATE (PF) 100 MCG/2ML IJ SOLN: 50.0000 ug | Freq: Once | INTRAMUSCULAR | Status: AC

## 2022-02-12 MED ORDER — FENTANYL CITRATE (PF) 250 MCG/5ML IJ SOLN
INTRAMUSCULAR | Status: DC | PRN
  Administered 2022-02-12: 50 ug via INTRAVENOUS

## 2022-02-12 MED ORDER — PHENYLEPHRINE HCL-NACL 20-0.9 MG/250ML-% IV SOLN
INTRAVENOUS | Status: DC | PRN
  Administered 2022-02-12: 25 ug/min via INTRAVENOUS

## 2022-02-12 MED ORDER — MIDAZOLAM HCL 2 MG/2ML IJ SOLN
INTRAMUSCULAR | Status: AC
  Administered 2022-02-12: 1 mg via INTRAVENOUS
  Filled 2022-02-12: qty 2

## 2022-02-12 MED ORDER — LIDOCAINE 2% (20 MG/ML) 5 ML SYRINGE
INTRAMUSCULAR | Status: DC | PRN
  Administered 2022-02-12: 60 mg via INTRAVENOUS

## 2022-02-12 SURGICAL SUPPLY — 62 items
ANCH SUT STRL LF DISP (Anchor) ×1 IMPLANT
BAG COUNTER SPONGE SURGICOUNT (BAG) ×1 IMPLANT
BAG SPNG CNTER NS LX DISP (BAG) ×1
BANDAGE ACE 3X5.8 VEL STRL LF (GAUZE/BANDAGES/DRESSINGS) IMPLANT
BANDAGE ACE 4X5 VEL STRL LF (GAUZE/BANDAGES/DRESSINGS) IMPLANT
BANDAGE ESMARK 6X9 LF (GAUZE/BANDAGES/DRESSINGS) IMPLANT
BNDG CMPR 9X4 STRL LF SNTH (GAUZE/BANDAGES/DRESSINGS)
BNDG CMPR 9X6 STRL LF SNTH (GAUZE/BANDAGES/DRESSINGS) ×1
BNDG ELASTIC 4X5.8 VLCR STR LF (GAUZE/BANDAGES/DRESSINGS) ×2 IMPLANT
BNDG ESMARK 4X9 LF (GAUZE/BANDAGES/DRESSINGS) IMPLANT
BNDG ESMARK 6X9 LF (GAUZE/BANDAGES/DRESSINGS) ×1
BUTTON SUTR W/ZIPLOOP 2.9 (Anchor) IMPLANT
CORD BIPOLAR FORCEPS 12FT (ELECTRODE) ×1 IMPLANT
CUFF TOURN SGL QUICK 18X4 (TOURNIQUET CUFF) ×1 IMPLANT
DEPRESSOR TONGUE BLADE STERILE (MISCELLANEOUS) ×1 IMPLANT
DRAPE IMP U-DRAPE 54X76 (DRAPES) ×1 IMPLANT
DRAPE OEC MINIVIEW 54X84 (DRAPES) ×1 IMPLANT
DRAPE SURG 17X23 STRL (DRAPES) ×1 IMPLANT
ELECT BLADE 4.0 EZ CLEAN MEGAD (MISCELLANEOUS) ×1
ELECT REM PT RETURN 9FT ADLT (ELECTROSURGICAL) ×1
ELECTRODE BLDE 4.0 EZ CLN MEGD (MISCELLANEOUS) ×1 IMPLANT
ELECTRODE REM PT RTRN 9FT ADLT (ELECTROSURGICAL) ×1 IMPLANT
GAUZE SPONGE 4X4 12PLY STRL (GAUZE/BANDAGES/DRESSINGS) IMPLANT
GAUZE XEROFORM 1X8 LF (GAUZE/BANDAGES/DRESSINGS) IMPLANT
GLOVE BIO SURGEON STRL SZ7 (GLOVE) ×1 IMPLANT
GLOVE BIO SURGEON STRL SZ7.5 (GLOVE) ×1 IMPLANT
GLOVE BIOGEL PI IND STRL 7.0 (GLOVE) ×1 IMPLANT
GLOVE BIOGEL PI IND STRL 8 (GLOVE) ×1 IMPLANT
GOWN STRL REUS W/ TWL LRG LVL3 (GOWN DISPOSABLE) ×2 IMPLANT
GOWN STRL REUS W/ TWL XL LVL3 (GOWN DISPOSABLE) ×1 IMPLANT
GOWN STRL REUS W/TWL LRG LVL3 (GOWN DISPOSABLE) ×2
GOWN STRL REUS W/TWL XL LVL3 (GOWN DISPOSABLE) ×1
K-WIRE 0.9X102 (Wire) ×1 IMPLANT
KIT BASIN OR (CUSTOM PROCEDURE TRAY) ×1 IMPLANT
KIT CANULA JUGGERLOC (CANNULA) IMPLANT
KWIRE 0.9X102 (Wire) IMPLANT
NS IRRIG 1000ML POUR BTL (IV SOLUTION) ×1 IMPLANT
PACK ORTHO EXTREMITY (CUSTOM PROCEDURE TRAY) ×1 IMPLANT
PAD CAST 4YDX4 CTTN HI CHSV (CAST SUPPLIES) ×2 IMPLANT
PADDING CAST COTTON 4X4 STRL (CAST SUPPLIES)
PADDING CAST SYNTHETIC 4X4 STR (CAST SUPPLIES) IMPLANT
PENCIL BUTTON HOLSTER BLD 10FT (ELECTRODE) ×1 IMPLANT
REAMER JUGGERLOC SLOTTED 8 (BIT) IMPLANT
SLING ARM IMMOBILIZER LRG (SOFTGOODS) IMPLANT
SPLINT FIBERGLASS 4X30 (CAST SUPPLIES) IMPLANT
SPONGE T-LAP 4X18 ~~LOC~~+RFID (SPONGE) ×1 IMPLANT
STOCKINETTE IMPERVIOUS 9X36 MD (GAUZE/BANDAGES/DRESSINGS) ×1 IMPLANT
STRIP CLOSURE SKIN 1/2X4 (GAUZE/BANDAGES/DRESSINGS) ×1 IMPLANT
SUT BRDBAND LOOP ST-NDL BL (SUTURE) IMPLANT
SUT ETHILON 3 0 PS 1 (SUTURE) IMPLANT
SUT ETHILON 4 0 PS 2 18 (SUTURE) IMPLANT
SUT FIBERWIRE #2 38 T-5 BLUE (SUTURE)
SUT VIC AB 0 CT1 27 (SUTURE) ×1
SUT VIC AB 0 CT1 27XBRD ANBCTR (SUTURE) IMPLANT
SUT VIC AB 3-0 SH 27 (SUTURE) ×1
SUT VIC AB 3-0 SH 27X BRD (SUTURE) ×1 IMPLANT
SUTURE FIBERWR #2 38 T-5 BLUE (SUTURE) IMPLANT
SYR CONTROL 10ML LL (SYRINGE) IMPLANT
TOWEL GREEN STERILE FF (TOWEL DISPOSABLE) ×1 IMPLANT
TOWEL OR NON WOVEN STRL DISP B (DISPOSABLE) ×1 IMPLANT
TUBE CONNECTING 20X1/4 (TUBING) ×1 IMPLANT
UNDERPAD 30X36 HEAVY ABSORB (UNDERPADS AND DIAPERS) ×1 IMPLANT

## 2022-02-12 NOTE — Discharge Instructions (Signed)

## 2022-02-12 NOTE — Op Note (Signed)
   Date of Surgery: 02/12/2022  INDICATIONS: Mr. Caseres is a 71 y.o.-year-old male with a left distal biceps rupture.  The patient did consent to the procedure after discussion of the risks and benefits.  PREOPERATIVE DIAGNOSIS: Left distal biceps rupture  POSTOPERATIVE DIAGNOSIS: Same.  PROCEDURE: Reinsertion of left distal biceps tendon  SURGEON: N. Eduard Roux, M.D.  ASSIST: Ciro Backer [place], [place]; necessary for the timely completion of procedure and due to complexity of procedure.  ANESTHESIA:  general, regional  IV FLUIDS AND URINE: See anesthesia.  ESTIMATED BLOOD LOSS: Minimal mL.  IMPLANTS:  Implant Name Type Inv. Item Serial No. Manufacturer Lot No. LRB No. Used Action  K-WIRE 0.9X102 - [id number] Wire K-WIRE 0.9X102  [facility] SURGICAL INSTRUMENTS [id number] Left 1 Temporary Fixation (Not Implanted)  BUTTON SUTR W/ZIPLOOP 2.9 - [id number] Anchor BUTTON SUTR W/ZIPLOOP 2.9  ZIMMER RECON(ORTH,TRAU,BIO,SG) [id number] Left 1 Implanted    DRAINS: None  COMPLICATIONS: see description of procedure.  DESCRIPTION OF PROCEDURE: The patient was brought to the operating room.  The patient had been signed prior to the procedure and this was documented. The patient had the anesthesia placed by the anesthesiologist.  A time-out was performed to confirm that this was the correct patient, site, side and location. The patient did receive antibiotics prior to the incision and was re-dosed during the procedure as needed at indicated intervals.  A tourniquet was placed.  The patient had the operative extremity prepped and draped in the standard surgical fashion.    A longitudinal incision was made in the proximal forearm between the interval of pronator teres and brachioradialis.  Dissection was carried down through the muscular interval.  Perforating veins were cauterized.  Branches of the L ABC were identified and mobilized.  We then entered into the pseudocapsule with return of serous  fluid.  The biceps tendon was encountered.  Adhesions were removed from the tendon.  The tendon was mobilized approximately to allow excursion distally.  The end of the tendon was prepared and whipstitched.  We carried our dissection down bluntly onto the radial tuberosity with the forearm in maximum supination.  Elevator was used to clear the radial tuberosity.  A provisional K wire was placed to mark the appropriate repair site which was confirmed using fluoroscopy.  A 2.9 mm pin was drilled bicortically and a 8 mm reamer was drilled unit cortically.  The button was then advanced and flipped on the far cortex and set in place.  The sutures were then attached to the button device and the tendon was delivered down into the 8 mm hole.  I was able to get about a centimeter of biceps tendon into the canal.  There were also interstitial tears within the substance of the tendon which I repaired with a running 0 Vicryl.  Surgical site was then thoroughly irrigated and closed with 3-0 Vicryl and 4-0 nylon.  Sterile dressings were applied.  The arm was placed in a long-arm splint at 90 degrees.  Patient tolerated procedure well had no immediate complications.  Tawanna Cooler was necessary for opening, closing, retracting, limb positioning and overall facilitation and timely completion of the procedure.  POSTOPERATIVE PLAN: Patient will be discharged home.  He will follow-up next Friday for splint removal and initiation of elbow range of motion.  Azucena Cecil, MD 2:19 PM

## 2022-02-12 NOTE — Anesthesia Procedure Notes (Signed)
Anesthesia Regional Block: Supraclavicular block   Pre-Anesthetic Checklist: , timeout performed,  Correct Patient, Correct Site, Correct Laterality,  Correct Procedure, Correct Position, site marked,  Risks and benefits discussed,  Surgical consent,  Pre-op evaluation,  At surgeon's request and post-op pain management  Laterality: Left  Prep: chloraprep       Needles:  Injection technique: Single-shot  Needle Type: Echogenic Stimulator Needle     Needle Length: 9cm  Needle Gauge: 21     Additional Needles:   Procedures:, nerve stimulator,,, ultrasound used (permanent image in chart),,     Nerve Stimulator or Paresthesia:  Response: biceps and triceps, 0.5 mA  Additional Responses:   Narrative:  Start time: 02/12/2022 12:20 PM End time: 02/12/2022 12:27 PM Injection made incrementally with aspirations every 5 mL.  Performed by: Personally  Anesthesiologist: Suzette Battiest, MD

## 2022-02-12 NOTE — Transfer of Care (Signed)
Immediate Anesthesia Transfer of Care Note  Patient: Lee [name]  Procedure(s) Performed: LEFT DISTAL BICEPS TENDON REPAIR (Left: Arm Upper)  Patient Location: PACU  Anesthesia Type:General  Level of Consciousness: awake  Airway & Oxygen Therapy: Patient Spontanous Breathing and Patient connected to face mask oxygen  Post-op Assessment: Report given to RN and Post -op Vital signs reviewed and stable  Post vital signs: Reviewed and stable  Last Vitals:  Vitals Value Taken Time  BP 121/81 02/12/22 1436  Temp    Pulse 90 02/12/22 1438  Resp 14 02/12/22 1438  SpO2 99 % 02/12/22 1438  Vitals shown include unvalidated device data.  Last Pain:  Vitals:   02/12/22 0943  PainSc: 0-No pain         Complications: No notable events documented.

## 2022-02-12 NOTE — H&P (Addendum)

## 2022-02-12 NOTE — Anesthesia Procedure Notes (Signed)
Procedure Name: Intubation Date/Time: 02/12/2022 12:55 PM  Performed by: Bryson Corona, CRNAPre-anesthesia Checklist: Patient identified, Emergency Drugs available, Suction available and Patient being monitored Patient Re-evaluated:Patient Re-evaluated prior to induction Oxygen Delivery Method: Circle System Utilized Preoxygenation: Pre-oxygenation with 100% oxygen Induction Type: IV induction Ventilation: Mask ventilation without difficulty Laryngoscope Size: Glidescope and 4 Grade View: Grade I Tube type: Oral Number of attempts: 2 Airway Equipment and Method: Stylet and Oral airway Placement Confirmation: ETT inserted through vocal cords under direct vision, positive ETCO2 and breath sounds checked- equal and bilateral Secured at: 22 cm Tube secured with: Tape Dental Injury: Teeth and Oropharynx as per pre-operative assessment  Comments: DL with MAC 3. Grade 3 view, unable to pass ETT. DL with glidescope 4 grade 1 view. Pt has limited mouth opening and prominent overbite.

## 2022-02-14 ENCOUNTER — Other Ambulatory Visit: Payer: Self-pay | Admitting: Family Medicine

## 2022-02-15 ENCOUNTER — Encounter (HOSPITAL_COMMUNITY): Payer: Self-pay | Admitting: Orthopaedic Surgery

## 2022-02-15 NOTE — Telephone Encounter (Signed)
Requested Prescriptions  Pending Prescriptions Disp Refills  . TRULICITY 3 ZO/1.0RU SOPN [Pharmacy Med Name: TRULICITY '3MG'$ /0.5ML SDP 0.5ML] 2 mL 2    Sig: ADMINISTER '3MG'$  UNDER THE SKIN 1 TIME A WEEK AS DIRECTED     Endocrinology:  Diabetes - GLP-1 Receptor Agonists Passed - 02/14/2022 10:15 AM      Passed - HBA1C is between 0 and 7.9 and within 180 days    Hgb A1c MFr Bld  Date Value Ref Range Status  12/07/2021 6.0 (H) <5.7 % of total Hgb Final    Comment:    For someone without known diabetes, a hemoglobin  A1c value between 5.7% and 6.4% is consistent with prediabetes and should be confirmed with a  follow-up test. . For someone with known diabetes, a value <7% indicates that their diabetes is well controlled. A1c targets should be individualized based on duration of diabetes, age, comorbid conditions, and other considerations. . This assay result is consistent with an increased risk of diabetes. . Currently, no consensus exists regarding use of hemoglobin A1c for diagnosis of diabetes for children. Renella Cunas - Valid encounter within last 6 months    Recent Outpatient Visits          4 months ago Coronary artery disease involving native coronary artery of native heart without angina pectoris   [place] Susy Frizzle, MD   7 months ago Type 2 diabetes mellitus with complication, without long-term current use of insulin ([place])   [place] Pickard, Cammie Mcgee, MD   1 year ago Wrist pain, chronic, right   [place] Pickard, Cammie Mcgee, MD   1 year ago General medical exam   [place] Susy Frizzle, MD   1 year ago Type 2 diabetes mellitus with complication, without long-term current use of insulin ([place])   [place] Pickard, Cammie Mcgee, MD      Future Appointments            In 1 month Claiborne Billings Joyice Faster, MD [place]. [place]

## 2022-02-15 NOTE — Anesthesia Postprocedure Evaluation (Signed)
Anesthesia Post Note  Patient: Lee [name]  Procedure(s) Performed: LEFT DISTAL BICEPS TENDON REPAIR (Left: Arm Upper)     Patient location during evaluation: PACU Anesthesia Type: General Level of consciousness: awake and alert Pain management: pain level controlled Vital Signs Assessment: post-procedure vital signs reviewed and stable Respiratory status: spontaneous breathing, nonlabored ventilation and respiratory function stable Cardiovascular status: stable and blood pressure returned to baseline Anesthetic complications: no   No notable events documented.  Last Vitals:  Vitals:   02/12/22 1500 02/12/22 1515  BP: 116/74 120/72  Pulse: 76 76  Resp: 16 14  Temp:  36.7 C  SpO2: 94% 92%    Last Pain:  Vitals:   02/12/22 1515  PainSc: 0-No pain                 Audry Pili

## 2022-02-17 ENCOUNTER — Encounter: Payer: Self-pay | Admitting: Orthopaedic Surgery

## 2022-02-17 NOTE — Telephone Encounter (Signed)
Patient will come in at 8:15 to see me about elbow.

## 2022-02-18 ENCOUNTER — Ambulatory Visit (INDEPENDENT_AMBULATORY_CARE_PROVIDER_SITE_OTHER): Payer: Medicare Other

## 2022-02-18 ENCOUNTER — Encounter: Payer: Self-pay | Admitting: Orthopaedic Surgery

## 2022-02-18 ENCOUNTER — Ambulatory Visit (INDEPENDENT_AMBULATORY_CARE_PROVIDER_SITE_OTHER): Payer: Medicare Other | Admitting: Orthopaedic Surgery

## 2022-02-18 DIAGNOSIS — M25522 Pain in left elbow: Secondary | ICD-10-CM

## 2022-02-18 DIAGNOSIS — S46212A Strain of muscle, fascia and tendon of other parts of biceps, left arm, initial encounter: Secondary | ICD-10-CM

## 2022-02-18 NOTE — Progress Notes (Signed)
Post-Op Visit Note   Patient: Lee [name]           Date of Birth: [birth year]           MRN: [id number] Visit Date: 02/18/2022 PCP: Susy Frizzle, MD   Assessment & Plan:  Chief Complaint:  Chief Complaint  Patient presents with   Left Elbow - Follow-up    Distal biceps tendon repair 02/12/2022   Visit Diagnoses:  1. Rupture of left distal biceps tendon, initial encounter   2. Pain in left elbow     Plan: Mr. Monteforte had an acute increase in elbow pain yesterday morning when he woke up and was getting out of the chair and felt intense pain in the surgical site.  He has been immobilized in a long-arm splint at 90 degrees this entire time.  He has significant pain to pronation and supination.  Does not have much pain to elbow flexion extension.  The surgical site is clean dry and intact.  No neurovascular compromise.  The x-rays are unremarkable.  Given these findings we will need an urgent MRI of the left elbow to evaluate for rerupture of the biceps tendon.  Long-arm splint reapplied.  We will contact the patient regarding the MRI results.  Follow-Up Instructions: No follow-ups on file.   Orders:  Orders Placed This Encounter  Procedures   XR Elbow 2 Views Left   No orders of the defined types were placed in this encounter.   Imaging: XR Elbow 2 Views Left  Result Date: 02/18/2022 No acute bony abnormalities.  No evidence of fracture.  The flip button is stable on the dorsal cortex of the proximal radial shaft.   PMFS History: Patient Active Problem List   Diagnosis Date Noted   Rupture of left distal biceps tendon 02/09/2022   Pain in left elbow 01/28/2022   Chronic migraine without aura with status migrainosus, not intractable 01/27/2022   Primary osteoarthritis of first carpometacarpal joint of right hand 09/02/2021   Pain in right knee 02/12/2020   Second degree Mobitz II AV block 04/30/2019   Syncope and collapse 10/10/2018   Heart block AV  complete ([place]) 10/10/2018   Symptomatic bradycardia 10/10/2018   Syncope, vasovagal 09/20/2018   Neck mass 10/14/2016   Depression 05/20/2016   Hypertensive heart disease    Paroxysmal atrial fibrillation ([place])    Hyperlipidemia LDL goal <70 03/27/2014   Premature ventricular contraction 03/04/2014   Chest pain with high risk of acute coronary syndrome 06/28/2013   Morbid obesity ([place]) 06/28/2013   OSA (obstructive sleep apnea) 12/18/2012   Sleepiness 11/06/2012   Fatigue 11/06/2012   PALPITATIONS 06/02/2009   DYSLIPIDEMIA 09/05/2008   Essential hypertension 09/05/2008   CAD S/P percutaneous coronary angioplasty 09/05/2008   RBBB 09/05/2008   SUPRAVENTRICULAR TACHYCARDIA 09/05/2008   ALLERGIC RHINITIS 09/05/2008   GERD 09/05/2008   BACK PAIN 09/05/2008   ARRHYTHMIA, HX OF 09/05/2008   MIGRAINES, HX OF 09/05/2008   Dyslipidemia 09/05/2008   Past Medical History:  Diagnosis Date   Allergic rhinitis    chronic sinusitis   Allergy    Arthritis    CAD (coronary artery disease)    a. 2009 PCI/DES to mLAD; b. 05/2010 s/p DES RCA and LAD.; c. 05/2013 Cath: LAD mild ISR, LCX nl, RCA 40p, patent stents.   Cataract    has had lasik surg   COPD (chronic obstructive pulmonary disease) (HCC)    Depression    Diabetes mellitus type 2, controlled,  without complications ([place])    Diabetes mellitus without complication ([place])    Phreesia 08/22/2020   Diverticulosis    DJD (degenerative joint disease)    Esophagitis 1991   grade 1   GERD (gastroesophageal reflux disease)    Hiatal hernia   Hemorrhoids    Hyperlipidemia    Hypertension    Hypertensive heart disease    Iron deficiency anemia    Migraines    Myocardial infarction [facility])    Paroxysmal atrial fibrillation ([place])    a. s/p afib ablation 10/22/08 ([facility]).   PVC's (premature ventricular contractions)    Sleep apnea    Uses nightly.  Questionalble: RDI during the total sleep time 6h 28 mins was 3.55/hr during REM sleep  was at 5.71/hr. Supine AHI was 5.93/hr.   Syncope 08/2018    Family History  Problem Relation Age of Onset   Colon cancer Mother        mets from uterine   Uterine cancer Mother    Heart disease Father        and sister   Diabetes Father    Hearing loss Father    Stroke Father    Heart disease Sister    Lung cancer Sister    Stroke Brother     Past Surgical History:  Procedure Laterality Date   ABDOMINAL HYSTERECTOMY     CARDIAC CATHETERIZATION  05/31/2010   The proximal LAD was then predilated with 2.0 x 12 trek. this was then stented with a 2.5 x 16 promus Element drug-eluting stent at 14 atmosphere and prostdilated with 2.75 x 12 [place] trek at 16 atmospheres(2.8 mm) resulting the reduction of 80% proximal LAD stenosis to 0% residual with excellent flow.   CARDIAC CATHETERIZATION  06/05/2010   Successful percutaneous coronary intervention to the right coronary artery with percutaneous transluminal coronary angioplasty/stenting and insertion 3.0 x 16 mm Promus DES post dilated to 3.35 mm with stenosis being reduced to 0%   CARDIAC CATHETERIZATION  02/29/2008   Post dilatation was performed using a 2.75 x 9 [place] sprinter, 10 atmospheres for 40 seconds and then 9 atmosphere for 35 sec. This resulted in the 80% area of narrowing pre-intervention, now appearing to normal. There was no edvidence of the dissection or thrombus and there was TIMI III flow pre and post.   CARDIAC CATHETERIZATION  02/28/2006   CORONARY ANGIOPLASTY WITH STENT PLACEMENT     3 stents/ 4 caths   DISTAL BICEPS TENDON REPAIR Left 02/12/2022   Procedure: LEFT DISTAL BICEPS TENDON REPAIR;  Surgeon: Leandrew Koyanagi, MD;  Location: [place];  Service: Orthopedics;  Laterality: Left;   EYE SURGERY     FINGER ARTHROPLASTY Right 09/02/2021   Procedure: RIGHT THUMB CARPOMETACARPAL ARTHROPLASTY;  Surgeon: Leandrew Koyanagi, MD;  Location: [place];  Service: Orthopedics;  Laterality: Right;  block in preop   KNEE  ARTHROSCOPY     right   LASIK     LEFT HEART CATHETERIZATION WITH CORONARY ANGIOGRAM N/A 06/29/2013   Procedure: LEFT HEART CATHETERIZATION WITH CORONARY ANGIOGRAM;  Surgeon: Leonie Man, MD;  Location: [facility] CATH LAB;  Service: Cardiovascular;  Laterality: N/A;   NASAL SINUS SURGERY  06/01/1999   NECK SURGERY     Cervical fusion   PACEMAKER IMPLANT N/A 05/01/2019   Procedure: PACEMAKER IMPLANT;  Surgeon: [name], MD;  Location: [place] CV LAB;  Service: Cardiovascular;  Laterality: N/A;   RADIOFREQUENCY ABLATION  09/28/2008   atrial fibrillation  ROTATOR CUFF REPAIR     left   SHOULDER OPEN ROTATOR CUFF REPAIR     right   SPINE SURGERY     WRIST ARTHROCENTESIS     left   Social History   Occupational History   Occupation: retired  Tobacco Use   Smoking status: Former    Packs/day: 2.00    Years: 25.00    Total pack years: 50.00    Types: Cigarettes, Cigars    Quit date: 04/22/1988    Years since quitting: 33.8   Smokeless tobacco: Never  Vaping Use   Vaping Use: Never used  Substance and Sexual Activity   Alcohol use: Not Currently   Drug use: No   Sexual activity: Not Currently    Birth control/protection: Surgical

## 2022-02-18 NOTE — Addendum Note (Signed)
Addended by: Lendon Collar on: 02/18/2022 08:57 AM   Modules accepted: Orders

## 2022-02-19 ENCOUNTER — Encounter: Payer: Medicare Other | Admitting: Orthopaedic Surgery

## 2022-02-23 ENCOUNTER — Ambulatory Visit (HOSPITAL_COMMUNITY)
Admission: RE | Admit: 2022-02-23 | Discharge: 2022-02-23 | Disposition: A | Discharge status: Home / Self Care | Payer: Medicare Other | Source: Ambulatory Visit | Attending: Orthopaedic Surgery | Admitting: Orthopaedic Surgery

## 2022-02-23 ENCOUNTER — Telehealth (INDEPENDENT_AMBULATORY_CARE_PROVIDER_SITE_OTHER): Payer: Medicare Other | Admitting: Orthopaedic Surgery

## 2022-02-23 DIAGNOSIS — S46212A Strain of muscle, fascia and tendon of other parts of biceps, left arm, initial encounter: Secondary | ICD-10-CM | POA: Diagnosis present

## 2022-02-23 DIAGNOSIS — M25522 Pain in left elbow: Secondary | ICD-10-CM | POA: Insufficient documentation

## 2022-02-23 NOTE — Telephone Encounter (Signed)
I called the patient today to go over the MRI which does show recurrent tear with retraction of the distal biceps.  Due to this being his dominant arm I have recommended revision surgery to reattach the distal biceps.  We will plan to do this within the next week or 2.  Lee [name] will call the patient to schedule surgery.

## 2022-02-23 NOTE — Progress Notes (Signed)
[facility] back to [facility] today for MRI L elbow wo contrast. St. Jude device. Transmission sent. Orders for DOO 90

## 2022-02-24 DIAGNOSIS — C801 Malignant (primary) neoplasm, unspecified: Secondary | ICD-10-CM

## 2022-02-24 HISTORY — DX: Malignant (primary) neoplasm, unspecified: C80.1

## 2022-02-24 NOTE — Progress Notes (Signed)
Mr Granquist was scheduled here at [facility] 2 weeks ago for his initial surgery for biceps tendon repair. He was moved to the Main hospital due to the need of a rep for pacemaker reprogramming. Now he is scheduled again here at [facility] for repeat repair of biceps tendon and will need to be moved to Main OR. Debbie at Dr Phoebe Sharps office made aware.

## 2022-02-26 ENCOUNTER — Ambulatory Visit (INDEPENDENT_AMBULATORY_CARE_PROVIDER_SITE_OTHER): Payer: Medicare Other

## 2022-02-26 DIAGNOSIS — I441 Atrioventricular block, second degree: Secondary | ICD-10-CM

## 2022-02-26 LAB — CUP PACEART REMOTE DEVICE CHECK
Battery Remaining Longevity: 72 mo
Battery Remaining Percentage: 72 %
Battery Voltage: 2.99 V
Brady Statistic AP VP Percent: 1 %
Brady Statistic AP VS Percent: 1 %
Brady Statistic AS VP Percent: 99 %
Brady Statistic AS VS Percent: 1 %
Brady Statistic RA Percent Paced: 1 %
Brady Statistic RV Percent Paced: 99 %
Date Time Interrogation Session: 20230929020015
Implantable Lead Implant Date: 20201201
Implantable Lead Implant Date: 20201201
Implantable Lead Location: 753859
Implantable Lead Location: 753860
Implantable Pulse Generator Implant Date: 20201201
Lead Channel Impedance Value: 460 Ohm
Lead Channel Impedance Value: 460 Ohm
Lead Channel Pacing Threshold Amplitude: 0.75 V
Lead Channel Pacing Threshold Amplitude: 1 V
Lead Channel Pacing Threshold Pulse Width: 0.5 ms
Lead Channel Pacing Threshold Pulse Width: 0.5 ms
Lead Channel Sensing Intrinsic Amplitude: 2.7 mV
Lead Channel Sensing Intrinsic Amplitude: 4.5 mV
Lead Channel Setting Pacing Amplitude: 2 V
Lead Channel Setting Pacing Amplitude: 2.5 V
Lead Channel Setting Pacing Pulse Width: 0.5 ms
Lead Channel Setting Sensing Sensitivity: 2 mV
Pulse Gen Model: 2272
Pulse Gen Serial Number: 9182765

## 2022-03-02 ENCOUNTER — Other Ambulatory Visit: Payer: Self-pay

## 2022-03-02 ENCOUNTER — Encounter (HOSPITAL_COMMUNITY): Payer: Self-pay | Admitting: Orthopaedic Surgery

## 2022-03-02 NOTE — Progress Notes (Signed)
PCP - Dr Jenna Luo Cardiologist - Dr Shelva Majestic EP - Dr Renato Shin Neurology - Dr Sarina Ill  Chest x-ray - n/a EKG - 11/10/21 Stress Test - 03/07/20 ECHO - 09/23/21 Cardiac Cath - 02/29/16  ICD Pacemaker - St Jude/Abbott Assurity MRI dual chamber pacemaker.  Rep. Windle Guard was informed of surgery date and time.  Last remote check was on 9/299/23.  Perioperative prescription for implanted cardiac device programming was submitted on 03/02/22.  Sleep Study -  Yes CPAP - uses nightly  SDW call.  Do not take Jardiance today (Tues) or on the morning of surgery (Wed).  The morning of surgery do not take Trulicity.   If your blood sugar is less than 70 mg/dL, you will need to treat for low blood sugar: Treat a low blood sugar (less than 70 mg/dL) with  cup of clear juice (cranberry or apple), 4 glucose tablets, OR glucose gel. Recheck blood sugar in 15 minutes after treatment (to make sure it is greater than 70 mg/dL). If your blood sugar is not greater than 70 mg/dL on recheck, call [phone] for further instructions.  Aspirin Instructions: Last dose was on 03/01/22.  ERAS: Clear liquids til 9 am DOS.  Anesthesia review: Yes  STOP now taking any Aspirin (unless otherwise instructed by your surgeon), Aleve, Naproxen, Ibuprofen, Motrin, Advil, Goody's, BC's, all herbal medications, fish oil, and all vitamins.   Coronavirus Screening Do you have any of the following symptoms:  Cough yes/no: No Fever (>100.5F)  yes/no: No Runny nose yes/no: No Sore throat yes/no: No Difficulty breathing/shortness of breath  yes/no: No  Have you traveled in the last 14 days and where? yes/no: No  Patient verbalized understanding of instructions that were given via phone

## 2022-03-02 NOTE — Anesthesia Preprocedure Evaluation (Addendum)
Anesthesia Evaluation  Patient identified by MRN, date of birth, ID band Patient awake    Reviewed: Allergy & Precautions, NPO status , Patient's Chart, lab work & pertinent test results  Airway Mallampati: III       Dental no notable dental hx.    Pulmonary sleep apnea and Continuous Positive Airway Pressure Ventilation , COPD, former smoker,    Pulmonary exam normal        Cardiovascular hypertension, Pt. on medications + Cardiac Stents  Normal cardiovascular exam+ dysrhythmias Atrial Fibrillation + pacemaker      Neuro/Psych  Headaches, PSYCHIATRIC DISORDERS Depression    GI/Hepatic GERD  Medicated,  Endo/Other  diabetes, Well Controlled, Type 2  Renal/GU      Musculoskeletal  (+) Arthritis , Osteoarthritis,    Abdominal (+) + obese,   Peds  Hematology   Anesthesia Other Findings Melida Quitter, MD  03/02/2022 6:43 PM EDT   Remote device interrogation reviewed. No events. Lead parameters and battery within normal limits.  Normal device interrogation.    DISCUSSION: Patient is a 71 year old male scheduled for the above procedure. Case previously scheduled/reviewed at [facility], but he has a PPM with EP recommendation that procedure will likely interfere with device function so device should be programmed asynchronous pacing during procedure and return to normal program after procedure.  Subsequently case moved to [facility] main OR. He is pacer dependent.  History includes former smoker (quit 04/22/88), COPD, CAD (MI DES mLAD 03/05/08; s/p dDES pRCA 06/05/10 with staged DES pLAD 06/08/10), PAF (Loop recorder 12/25/04-11/11/06; s/p ablation of PAF/SVT 10/22/08), PVCs, HTN, HLD, syncope (suspected vagally medicate transient heart block 09/20/18, diltiazem discontinued->recurrent admission 10/10/18, b-blocker discontinued->s/p St. 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   Last EP cardiology follow-up was on 11/10/21 with Tommye Standard, PA-C. She notes he was doing well. He intermittently feels PVCs, but not enough to want to adjust medications. He was exercising regularly, either walking outside or going to a fitness center with his wife and using the bike or rowing machine. No syncope. PPM functioning normally, pacer dependent. VIP programmed off. Afib burden < 1%., off anticoagulation therapy. No CAD symptoms with scheduled follow-up with Dr. Claiborne Billings in the Fall (03/29/22). He had a virtual preoperative visit with Leanor Kail, PA on 08/05/21 prior to thumb surgery. One year EP follow-up planned.    EP Perioperative PPM recommendations: Device Information: Clinic EP Physician:Dr. Augustas Mealor Device Type:Pacemaker Manufacturer and Phone #:St. Jude/Abbott: [phone] Pacemaker Dependent?:Yes. Date of Last Device Check:9/8/2023Normal Device Function?:Yes.  Electrophysiologist's Recommendations: ? Have magnet available. ? Provide continuous ECG monitoring when magnet is used or reprogramming is to be performed. ? Procedure will likely interfere with device function. Device should be programmed: Asynchronous pacing during procedure and returned to normal programming after procedure  He is on Trulicity weekly, on Tuesday by notes. Case is a late add-on to [facility] for tomorrow (02/12/22) after being moved from [facility]. PAT phone RN will confirm last dose and provide instructions to hold until after surgery when she calls to review preoperative instructions. Case is posted for general anesthesia.   Anesthesia team to evaluate on the day of surgery.    VS:  BP Readings from Last 3 Encounters: 01/27/22 134/84 12/07/21 (!) 138/98 11/10/21 130/80  Pulse Readings from Last 3 Encounters: 08/30       Reproductive/Obstetrics  Anesthesia Evaluation  Patient identified by MRN, date of birth, ID band Patient awake    Reviewed: Allergy & Precautions, NPO status , Patient's Chart, lab work & pertinent test results  Airway Mallampati: II  TM Distance: >3 FB Neck ROM: Full    Dental  (+) Dental Advisory Given   Pulmonary sleep apnea and Continuous Positive Airway Pressure Ventilation , COPD, former smoker,    breath sounds clear to auscultation       Cardiovascular hypertension, Pt. on medications + CAD and + Cardiac Stents  + dysrhythmias Atrial Fibrillation  Rhythm:Regular Rate:Normal     Neuro/Psych  Headaches,    GI/Hepatic Neg liver ROS, GERD  ,  Endo/Other  diabetes, Type 2  Renal/GU negative Renal ROS     Musculoskeletal  (+) Arthritis ,   Abdominal   Peds  Hematology  (+) Blood dyscrasia, anemia ,   Anesthesia Other Findings   Reproductive/Obstetrics                            Lab Results  Component Value Date   WBC 7.3 02/12/2022   HGB 15.6 02/12/2022   HCT 47.1 02/12/2022   MCV 89.7 02/12/2022   PLT 247 02/12/2022   Lab Results  Component Value Date   CREATININE 0.74 02/12/2022   BUN 14 02/12/2022   NA 136 02/12/2022   K 4.5 02/12/2022   CL 101 02/12/2022   CO2 23 02/12/2022    Anesthesia Physical Anesthesia Plan  ASA: 3  Anesthesia Plan: General   Post-op Pain Management: Regional block* and Tylenol PO (pre-op)*   Induction: Intravenous  PONV Risk Score and Plan: 2 and Dexamethasone, Ondansetron and Treatment may vary due to age or medical condition  Airway Management Planned: Oral ETT  Additional Equipment: None  Intra-op Plan:   Post-operative Plan: Extubation in OR  Informed Consent: I have reviewed the patients History and Physical, chart, labs and discussed the procedure including the risks, benefits and  alternatives for the proposed anesthesia with the patient or authorized representative who has indicated his/her understanding and acceptance.     Dental advisory given  Plan Discussed with: CRNA  Anesthesia Plan Comments: (PAT note written 02/11/2022 by Myra Gianotti, PA-C. )       Anesthesia Quick Evaluation  Anesthesia Physical Anesthesia Plan  ASA: 3  Anesthesia Plan: General   Post-op Pain Management: Regional block*   Induction: Intravenous  PONV Risk Score and Plan: 2 and Ondansetron and Treatment may vary due to age or medical condition  Airway Management Planned: LMA  Additional Equipment: None  Intra-op Plan:   Post-operative Plan: Extubation in OR  Informed Consent: I have reviewed the patients History and Physical, chart, labs and discussed the procedure including the risks, benefits and alternatives for the proposed anesthesia with the patient or authorized representative who has indicated his/her understanding and acceptance.     Dental advisory given  Plan Discussed with: CRNA  Anesthesia Plan Comments: (PAT note written 03/02/2022 by Myra Gianotti, PA-C. )      Anesthesia Quick Evaluation                                   Anesthesia Evaluation  Patient identified by MRN, date of birth, ID band Patient awake    Reviewed: Allergy & Precautions, NPO status , Patient's Chart, lab work & pertinent test results  Airway Mallampati: II  TM Distance: >3 FB Neck ROM: Full    Dental  (+) Dental Advisory Given   Pulmonary sleep apnea and Continuous Positive Airway Pressure Ventilation , COPD, former smoker,    breath sounds clear to auscultation       Cardiovascular hypertension, Pt. on medications + CAD and + Cardiac Stents  + dysrhythmias Atrial Fibrillation  Rhythm:Regular Rate:Normal     Neuro/Psych  Headaches,    GI/Hepatic Neg liver ROS, GERD  ,  Endo/Other  diabetes, Type 2  Renal/GU negative Renal ROS      Musculoskeletal  (+) Arthritis ,   Abdominal   Peds  Hematology  (+) Blood dyscrasia, anemia ,   Anesthesia Other Findings   Reproductive/Obstetrics                            Lab Results  Component Value Date   WBC 7.3 02/12/2022   HGB 15.6 02/12/2022   HCT 47.1 02/12/2022   MCV 89.7 02/12/2022   PLT 247 02/12/2022   Lab Results  Component Value Date   CREATININE 0.74 02/12/2022   BUN 14 02/12/2022   NA 136 02/12/2022   K 4.5 02/12/2022   CL 101 02/12/2022   CO2 23 02/12/2022    Anesthesia Physical Anesthesia Plan  ASA: 3  Anesthesia Plan: General   Post-op Pain Management: Regional block* and Tylenol PO (pre-op)*   Induction: Intravenous  PONV Risk Score and Plan: 2 and Dexamethasone, Ondansetron and Treatment may vary due to age or medical condition  Airway Management Planned: Oral ETT  Additional Equipment: None  Intra-op Plan:   Post-operative Plan: Extubation in OR  Informed Consent: I have reviewed the patients History and Physical, chart, labs and discussed the procedure including the risks, benefits and alternatives for the proposed anesthesia with the patient or authorized representative who has indicated his/her understanding and acceptance.     Dental advisory given  Plan Discussed with: CRNA  Anesthesia Plan Comments: (PAT note written 02/11/2022 by Myra Gianotti, PA-C. )       Anesthesia Quick Evaluation

## 2022-03-02 NOTE — Progress Notes (Signed)
Anesthesia Chart Review: SAME DAY WORK-UP  Case: [id number] Date/Time: 03/03/22 1145   Procedure: LEFT DISTAL BICEPS TENDON REPAIR (Left)   Anesthesia type: Choice   Pre-op diagnosis: left distal biceps rupture   Location: [facility] OR ROOM 06 / [place] [facility]   Surgeons: Leandrew Koyanagi, MD       DISCUSSION: Patient is a 71 year old male scheduled for the above procedure. He underwent left distal biceps tendon reinsertion on 02/12/22. At follow-up, he was having significant pain with pronation and supination, so urgent MRI left elbow ordered which confirmed recurrent complete distal biceps tendon tear. Long arm splint re-applied and above surgery recommended. He was initially scheduled at [facility], but due to PPM needing programming, it was again recommended that surgery be moved to [place]. He is pacer dependent.   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    Last EP cardiology follow-up was on 11/10/21 with Tommye Standard, PA-C. She notes he was doing well. He intermittently feels PVCs, but not enough to want to adjust medications. He was exercising regularly, either walking outside or going to a fitness center with his wife and using the bike or rowing machine. No syncope. PPM functioning normally, pacer dependent. VIP programmed off. Afib burden < 1%., off anticoagulation therapy. No CAD symptoms with scheduled follow-up with Dr. Claiborne Billings in the Fall (03/29/22). He had a virtual preoperative visit with  Leanor Kail, PA on 08/05/21 prior to thumb surgery. One year EP follow-up planned.     Still awaiting EP Perioperative PPM recommendations for this procedure, although asynchronous pacing during procedure with return to normal program after procedure recommended for his previous elbow surgery 02/12/22.  He is on Trulicity weekly. Case is a late add-on to [place] for tomorrow (03/03/22) after being moved from [facility]. He reported last Trulicity 8/50/27. Case is posted for Choice anesthesia--previously received GETA 02/12/22.    Anesthesia team to evaluate on the day of surgery.    VS: Ht _0  (1.727 m)   Wt 105.7 kg   BMI 35.43 kg/m  BP Readings from Last 3 Encounters:  02/12/22 120/72  01/27/22 134/84  12/07/21 (!) 138/98   Pulse Readings from Last 3 Encounters:  02/12/22 76  01/27/22 90  12/07/21 86     PROVIDERS: Susy Frizzle, MD is PCP  Renato Shin, MD is newly assigned EP, previously [name], MD  Shelva Majestic, MD is primary cardiologist Sarina Ill, MD is neurologist (migraines)   LABS:  Lab Results  Component Value Date   WBC 7.3 02/12/2022   HGB 15.6 02/12/2022   HCT 47.1 02/12/2022   PLT 247 02/12/2022   GLUCOSE 116 (H) 02/12/2022   ALT 13 12/07/2021   AST 14 12/07/2021   NA 136 02/12/2022   K 4.5 02/12/2022   CL 101 02/12/2022   CREATININE 0.74 02/12/2022   BUN 14 02/12/2022   CO2 23 02/12/2022   HGBA1C 6.0 (H) 12/07/2021     IMAGES: MRI Left Elbow 02/23/22: IMPRESSION: 1. Postsurgical changes related to interval distal biceps tendon repair, with recurrent complete tear. 2. Unchanged  moderate common extensor tendinosis with low-grade interstitial tearing. 3. Similar mild elbow osteoarthritis.     EKG: 11/10/21: V-paced rhythm     CV: Echo 09/23/21: IMPRESSIONS   1. Left ventricular ejection fraction, by estimation, is 55 to 60%. The  left ventricle has normal function. The left ventricle has no regional  wall motion  abnormalities. Left ventricular diastolic parameters are  consistent with Grade I diastolic  dysfunction (impaired relaxation).   2. Right ventricular systolic function is normal. The right ventricular  size is normal. Tricuspid regurgitation signal is inadequate for assessing  PA pressure.   3. The mitral valve is normal in structure. Trivial mitral valve  regurgitation. No evidence of mitral stenosis.   4. The aortic valve has an indeterminant number of cusps. Aortic valve  regurgitation is not visualized. Mild to moderate aortic valve stenosis.   5. The inferior vena cava is normal in size with greater than 50%  respiratory variability, suggesting right atrial pressure of 3 mmHg.      Nuclear stress test 03/07/20: The left ventricular ejection fraction is mildly decreased (45-54%). Nuclear stress EF: 54%. There was no ST segment deviation noted during stress. Defect 1: There is a large defect of moderate severity present in the basal inferior, mid inferior, apical inferior and apex location. This is an intermediate risk study.   There is a large defect of moderate severity throughout the entire inferior wall. This is unchanged between stress and rest images. There is normal wall motion in this area. There is significant extracardiac activity in the area as well. Taken together, this highly suggests artifact, but fixed defect cannot be excluded. No evidence of reversible ischemia.     Cardiac event monitor 09/25/18-10/24/18: The patient was monitored for 27 through Oct 24, 2018.  The predominant rhythm was sinus with an average rate at 77 bpm.  Slowest heart rate recorded was 21 bpm on May 12 and the fastest heart rate was sinus tachycardia at140 bpm on May 23.  He had periods of first-degree block, bradycardia with PVCs at 5 pauses greater than 3 seconds.  On May 12 he developed third-degree heart block with escape 30 bpm and also an episode of idioventricular escape rhythm with rate at 21  bpm.  He had also developed an episode of 6 beats of ventricular tachycardia. The patient was subsequently evaluated by Dr. Rayann Heman on 10/11/2018.     Cardiac cath 06/29/13: Left Ventriculography: EF: 65 % Wall Motion: Normal   Coronary Anatomy: Left Main: Large-caliber vessel that bifurcates into the LAD, and Circumflex. Angiographically normal. LAD: Large-caliber vessel with patent stent proximally with a roughly 20% irregular in-stent re-stenosis. The mid stent also had tubular 40% in-stent restenosis. The remainder the vessel is relatively free of disease it wraps the apex. D1: Small-caliber vessel, jailed by the stent. No significant lesions.   Left Circumflex: Large-caliber vessel, angiographically normal. It actually courses as a large lateral OM. They're several small branches of the small AV groove branch   RCA: Large caliber, dominant vessel with a roughly 40% narrowing just proximal to the widely patent stent in the mid vessel the it then courses down around 2 terminates as The Right Posterior Descending Artery prior to giving off the Right Posterior AV Groove Branch (RPAV). Beyond the patent stent, no significant lesions noted. RPDA: Moderate-caliber vessel that reaches almost to the apex. Tortuous but free of disease. RPL Sysytem:The RPAV moderate caliber vessel that terminates as a single posterior lateral branches several small  branches. Minimal luminal irregularity.   POST-OPERATIVE DIAGNOSIS:   No angiographic evidence of significant CAD. If there is concern for follow on symptoms, would consider Myoview stress test to assess the LAD stent in stent restenosis. Preserved EF with elevated EDP.  Past Medical History:  Diagnosis Date   Allergic rhinitis    chronic sinusitis   Allergy    Arthritis    osetoarthritis   CAD (coronary artery disease)    a. 2009 PCI/DES to mLAD; b. 05/2010 s/p DES RCA and LAD.; c. 05/2013 Cath: LAD mild ISR, LCX nl, RCA 40p, patent stents.    Cataract    has had lasik surg   COPD (chronic obstructive pulmonary disease) (HCC)    Depression    Diabetes mellitus type 2, controlled, without complications ([place])    Diabetes mellitus without complication ([place])    Phreesia 08/22/2020   Diverticulosis    DJD (degenerative joint disease)    Esophagitis 1991   grade 1   GERD (gastroesophageal reflux disease)    Hiatal hernia   Hemorrhoids    Hyperlipidemia    Hypertension    Hypertensive heart disease    Iron deficiency anemia    Migraines    Myocardial infarction [facility])    Paroxysmal atrial fibrillation ([place])    a. s/p afib ablation 10/22/08 ([facility]).   PVC's (premature ventricular contractions)    Sleep apnea    Uses nightly.  Questionalble: RDI during the total sleep time 6h 28 mins was 3.55/hr during REM sleep was at 5.71/hr. Supine AHI was 5.93/hr.   Syncope 08/2018    Past Surgical History:  Procedure Laterality Date   ABDOMINAL HYSTERECTOMY     CARDIAC CATHETERIZATION  05/31/2010   The proximal LAD was then predilated with 2.0 x 12 trek. this was then stented with a 2.5 x 16 promus Element drug-eluting stent at 14 atmosphere and prostdilated with 2.75 x 12 [place] trek at 16 atmospheres(2.8 mm) resulting the reduction of 80% proximal LAD stenosis to 0% residual with excellent flow.   CARDIAC CATHETERIZATION  06/05/2010   Successful percutaneous coronary intervention to the right coronary artery with percutaneous transluminal coronary angioplasty/stenting and insertion 3.0 x 16 mm Promus DES post dilated to 3.35 mm with stenosis being reduced to 0%   CARDIAC CATHETERIZATION  02/29/2008   Post dilatation was performed using a 2.75 x 9 [place] sprinter, 10 atmospheres for 40 seconds and then 9 atmosphere for 35 sec. This resulted in the 80% area of narrowing pre-intervention, now appearing to normal. There was no edvidence of the dissection or thrombus and there was TIMI III flow pre and post.   CARDIAC CATHETERIZATION  02/28/2006    CORONARY ANGIOPLASTY WITH STENT PLACEMENT     3 stents/ 4 caths   DISTAL BICEPS TENDON REPAIR Left 02/12/2022   Procedure: LEFT DISTAL BICEPS TENDON REPAIR;  Surgeon: Leandrew Koyanagi, MD;  Location: [place];  Service: Orthopedics;  Laterality: Left;   EYE SURGERY     FINGER ARTHROPLASTY Right 09/02/2021   Procedure: RIGHT THUMB CARPOMETACARPAL ARTHROPLASTY;  Surgeon: Leandrew Koyanagi, MD;  Location: [place];  Service: Orthopedics;  Laterality: Right;  block in preop   KNEE ARTHROSCOPY     right   LASIK     LEFT HEART CATHETERIZATION WITH CORONARY ANGIOGRAM N/A 06/29/2013   Procedure: LEFT HEART CATHETERIZATION WITH CORONARY ANGIOGRAM;  Surgeon: Leonie Man, MD;  Location: [facility] C[facility] LAB;  Service: Cardiovascular;  Laterality: N/A;   NASAL  SINUS SURGERY  06/01/1999   NECK SURGERY     Cervical fusion   PACEMAKER IMPLANT N/A 05/01/2019   Procedure: PACEMAKER IMPLANT;  Surgeon: [name], MD;  Location: [place] CV LAB;  Service: Cardiovascular;  Laterality: N/A;   RADIOFREQUENCY ABLATION  09/28/2008   atrial fibrillation   ROTATOR CUFF REPAIR     left   SHOULDER OPEN ROTATOR CUFF REPAIR     right   SPINE SURGERY     WRIST ARTHROCENTESIS     left    MEDICATIONS: No current facility-administered medications for this encounter.    ACCU-CHEK GUIDE test strip   acetaminophen (TYLENOL) 325 MG tablet   amLODipine (NORVASC) 10 MG tablet   aspirin EC 81 MG tablet   Blood Glucose Monitoring Suppl (ACCU-CHEK AVIVA PLUS) w/Device KIT   cholecalciferol (VITAMIN D) 1000 UNITS tablet   citalopram (CELEXA) 40 MG tablet   Coenzyme Q10 (CO Q 10) 100 MG CAPS   fexofenadine (ALLEGRA) 180 MG tablet   fluticasone (FLONASE) 50 MCG/ACT nasal spray   furosemide (LASIX) 40 MG tablet   Galcanezumab-gnlm (EMGALITY) 120 MG/ML SOAJ   JARDIANCE 25 MG TABS tablet   Lancets Misc. (ACCU-CHEK FASTCLIX LANCET) KIT   Multiple Vitamin (MULTIVITAMIN WITH MINERALS) TABS tablet   nitroGLYCERIN  (NITROSTAT) 0.4 MG SL tablet   oxyCODONE-acetaminophen (PERCOCET) 10-325 MG tablet   oxyCODONE-acetaminophen (PERCOCET) 5-325 MG tablet   pantoprazole (PROTONIX) 40 MG tablet   Polyvinyl Alcohol-Povidone (REFRESH OP)   potassium chloride (MICRO-K) 10 MEQ CR capsule   Rimegepant Sulfate 75 MG TBDP   rosuvastatin (CRESTOR) 40 MG tablet   telmisartan (MICARDIS) 40 MG tablet   TRULICITY 3 FU/0.7KT SOPN    Myra Gianotti, PA-C Surgical Short Stay/Anesthesiology [facility] Phone [phone] [facility] Phone [phone] 03/02/2022 1:32 PM

## 2022-03-03 ENCOUNTER — Ambulatory Visit (HOSPITAL_BASED_OUTPATIENT_CLINIC_OR_DEPARTMENT_OTHER): Payer: Medicare Other | Admitting: Physician Assistant

## 2022-03-03 ENCOUNTER — Ambulatory Visit (HOSPITAL_COMMUNITY)
Admission: RE | Admit: 2022-03-03 | Discharge: 2022-03-03 | Disposition: A | Discharge status: Home / Self Care | Payer: Medicare Other | Attending: Orthopaedic Surgery | Admitting: Orthopaedic Surgery

## 2022-03-03 ENCOUNTER — Encounter (HOSPITAL_COMMUNITY)
Admission: RE | Disposition: A | Discharge status: Home / Self Care | Payer: Self-pay | Source: Home / Self Care | Attending: Orthopaedic Surgery

## 2022-03-03 ENCOUNTER — Other Ambulatory Visit: Payer: Self-pay

## 2022-03-03 ENCOUNTER — Ambulatory Visit (HOSPITAL_COMMUNITY): Payer: Medicare Other

## 2022-03-03 ENCOUNTER — Ambulatory Visit (HOSPITAL_COMMUNITY): Payer: Medicare Other | Admitting: Physician Assistant

## 2022-03-03 DIAGNOSIS — Z87891 Personal history of nicotine dependence: Secondary | ICD-10-CM

## 2022-03-03 DIAGNOSIS — S46212A Strain of muscle, fascia and tendon of other parts of biceps, left arm, initial encounter: Secondary | ICD-10-CM | POA: Insufficient documentation

## 2022-03-03 DIAGNOSIS — T84298A Other mechanical complication of internal fixation device of other bones, initial encounter: Secondary | ICD-10-CM | POA: Diagnosis not present

## 2022-03-03 DIAGNOSIS — S46212D Strain of muscle, fascia and tendon of other parts of biceps, left arm, subsequent encounter: Secondary | ICD-10-CM

## 2022-03-03 DIAGNOSIS — X58XXXA Exposure to other specified factors, initial encounter: Secondary | ICD-10-CM | POA: Diagnosis not present

## 2022-03-03 DIAGNOSIS — G4730 Sleep apnea, unspecified: Secondary | ICD-10-CM | POA: Diagnosis not present

## 2022-03-03 DIAGNOSIS — E119 Type 2 diabetes mellitus without complications: Secondary | ICD-10-CM | POA: Diagnosis not present

## 2022-03-03 DIAGNOSIS — Z6831 Body mass index (BMI) 31.0-31.9, adult: Secondary | ICD-10-CM | POA: Insufficient documentation

## 2022-03-03 DIAGNOSIS — I4891 Unspecified atrial fibrillation: Secondary | ICD-10-CM | POA: Diagnosis not present

## 2022-03-03 DIAGNOSIS — J449 Chronic obstructive pulmonary disease, unspecified: Secondary | ICD-10-CM

## 2022-03-03 DIAGNOSIS — Y793 Surgical instruments, materials and orthopedic devices (including sutures) associated with adverse incidents: Secondary | ICD-10-CM | POA: Insufficient documentation

## 2022-03-03 DIAGNOSIS — M9689 Other intraoperative and postprocedural complications and disorders of the musculoskeletal system: Secondary | ICD-10-CM | POA: Diagnosis present

## 2022-03-03 DIAGNOSIS — E669 Obesity, unspecified: Secondary | ICD-10-CM | POA: Diagnosis not present

## 2022-03-03 DIAGNOSIS — Y838 Other surgical procedures as the cause of abnormal reaction of the patient, or of later complication, without mention of misadventure at the time of the procedure: Secondary | ICD-10-CM | POA: Diagnosis not present

## 2022-03-03 HISTORY — DX: Cardiac murmur, unspecified: R01.1

## 2022-03-03 HISTORY — PX: DISTAL BICEPS TENDON REPAIR: SHX1461

## 2022-03-03 HISTORY — DX: Unspecified hearing loss, unspecified ear: H91.90

## 2022-03-03 LAB — GLUCOSE, CAPILLARY
Glucose-Capillary: 122 mg/dL — ABNORMAL HIGH (ref 70–99)
Glucose-Capillary: 93 mg/dL (ref 70–99)
Glucose-Capillary: 121 mg/dL — ABNORMAL HIGH (ref 70–99)

## 2022-03-03 SURGERY — REPAIR, TENDON, BICEPS, DISTAL
Anesthesia: Monitor Anesthesia Care | Site: Arm Lower | Laterality: Left

## 2022-03-03 MED ORDER — LIDOCAINE 2% (20 MG/ML) 5 ML SYRINGE
INTRAMUSCULAR | Status: DC | PRN
  Administered 2022-03-03: 100 mg via INTRAVENOUS

## 2022-03-03 MED ORDER — DEXAMETHASONE SODIUM PHOSPHATE 10 MG/ML IJ SOLN
INTRAMUSCULAR | Status: DC | PRN
  Administered 2022-03-03: 10 mg

## 2022-03-03 MED ORDER — PROPOFOL 500 MG/50ML IV EMUL
INTRAVENOUS | Status: DC | PRN
  Administered 2022-03-03: 50 ug/kg/min via INTRAVENOUS

## 2022-03-03 MED ORDER — ONDANSETRON HCL 4 MG/2ML IJ SOLN
INTRAMUSCULAR | Status: DC | PRN
  Administered 2022-03-03: 4 mg via INTRAVENOUS

## 2022-03-03 MED ORDER — DEXAMETHASONE SODIUM PHOSPHATE 10 MG/ML IJ SOLN
INTRAMUSCULAR | Status: DC | PRN
  Administered 2022-03-03: 10 mg via INTRAVENOUS

## 2022-03-03 MED ORDER — EPHEDRINE SULFATE-NACL 50-0.9 MG/10ML-% IV SOSY
PREFILLED_SYRINGE | INTRAVENOUS | Status: DC | PRN
  Administered 2022-03-03: 10 mg via INTRAVENOUS

## 2022-03-03 MED ORDER — PROPOFOL 10 MG/ML IV BOLUS
INTRAVENOUS | Status: DC | PRN
  Administered 2022-03-03: 200 mg via INTRAVENOUS

## 2022-03-03 MED ORDER — LACTATED RINGERS IV SOLN: INTRAVENOUS | Status: DC

## 2022-03-03 MED ORDER — 0.9 % SODIUM CHLORIDE (POUR BTL) OPTIME
TOPICAL | Status: DC | PRN
  Administered 2022-03-03 (×2): 1000 mL

## 2022-03-03 MED ORDER — ROPIVACAINE HCL 5 MG/ML IJ SOLN
INTRAMUSCULAR | Status: DC | PRN
  Administered 2022-03-03 (×5): 5 mL via PERINEURAL

## 2022-03-03 MED ORDER — MIDAZOLAM HCL 2 MG/2ML IJ SOLN
INTRAMUSCULAR | Status: AC
  Filled 2022-03-03: qty 2

## 2022-03-03 MED ORDER — FENTANYL CITRATE (PF) 100 MCG/2ML IJ SOLN
INTRAMUSCULAR | Status: AC
  Filled 2022-03-03: qty 2

## 2022-03-03 MED ORDER — ORAL CARE MOUTH RINSE: 15.0000 mL | Freq: Once | OROMUCOSAL | Status: AC

## 2022-03-03 MED ORDER — BUPIVACAINE HCL (PF) 0.25 % IJ SOLN
INTRAMUSCULAR | Status: AC
  Filled 2022-03-03: qty 30

## 2022-03-03 MED ORDER — HYDROCODONE-ACETAMINOPHEN 7.5-325 MG PO TABS: 1.0000 | ORAL_TABLET | Freq: Three times a day (TID) | ORAL | 0 refills | Status: DC | PRN

## 2022-03-03 MED ORDER — ACETAMINOPHEN 500 MG PO TABS
1000.0000 mg | ORAL_TABLET | Freq: Once | ORAL | Status: AC
  Administered 2022-03-03: 1000 mg via ORAL
  Filled 2022-03-03: qty 2

## 2022-03-03 MED ORDER — MIDAZOLAM HCL 2 MG/2ML IJ SOLN
INTRAMUSCULAR | Status: DC | PRN
  Administered 2022-03-03 (×2): 1 mg via INTRAVENOUS

## 2022-03-03 MED ORDER — INSULIN ASPART 100 UNIT/ML IJ SOLN: 0.0000 [IU] | INTRAMUSCULAR | Status: DC | PRN

## 2022-03-03 MED ORDER — CEFAZOLIN SODIUM-DEXTROSE 2-4 GM/100ML-% IV SOLN
2.0000 g | INTRAVENOUS | Status: AC
  Administered 2022-03-03: 2 g via INTRAVENOUS
  Filled 2022-03-03: qty 100

## 2022-03-03 MED ORDER — FENTANYL CITRATE (PF) 100 MCG/2ML IJ SOLN: 25.0000 ug | INTRAMUSCULAR | Status: DC | PRN

## 2022-03-03 MED ORDER — FENTANYL CITRATE (PF) 250 MCG/5ML IJ SOLN
INTRAMUSCULAR | Status: DC | PRN
  Administered 2022-03-03 (×2): 50 ug via INTRAVENOUS

## 2022-03-03 MED ORDER — CLONIDINE HCL (ANALGESIA) 100 MCG/ML EP SOLN
EPIDURAL | Status: DC | PRN
  Administered 2022-03-03: 100 ug

## 2022-03-03 MED ORDER — ACETAMINOPHEN 10 MG/ML IV SOLN: 1000.0000 mg | Freq: Once | INTRAVENOUS | Status: DC | PRN

## 2022-03-03 MED ORDER — PHENYLEPHRINE HCL-NACL 20-0.9 MG/250ML-% IV SOLN
INTRAVENOUS | Status: DC | PRN
  Administered 2022-03-03: 10 ug/min via INTRAVENOUS

## 2022-03-03 MED ORDER — CHLORHEXIDINE GLUCONATE 0.12 % MT SOLN
15.0000 mL | Freq: Once | OROMUCOSAL | Status: AC
  Administered 2022-03-03: 15 mL via OROMUCOSAL
  Filled 2022-03-03: qty 15

## 2022-03-03 MED ORDER — ONDANSETRON HCL 4 MG/2ML IJ SOLN: 4.0000 mg | Freq: Once | INTRAMUSCULAR | Status: DC | PRN

## 2022-03-03 SURGICAL SUPPLY — 72 items
BAG COUNTER SPONGE SURGICOUNT (BAG) ×1 IMPLANT
BAG SPNG CNTER NS LX DISP (BAG) ×1
BLADE SURG 15 STRL LF DISP TIS (BLADE) IMPLANT
BLADE SURG 15 STRL SS (BLADE) ×1
BNDG CMPR 9X4 STRL LF SNTH (GAUZE/BANDAGES/DRESSINGS) ×1
BNDG ELASTIC 3X5.8 VLCR STR LF (GAUZE/BANDAGES/DRESSINGS) IMPLANT
BNDG ELASTIC 4X5.8 VLCR STR LF (GAUZE/BANDAGES/DRESSINGS) IMPLANT
BNDG ESMARK 4X9 LF (GAUZE/BANDAGES/DRESSINGS) IMPLANT
BNDG GAUZE DERMACEA FLUFF 4 (GAUZE/BANDAGES/DRESSINGS) IMPLANT
BNDG GZE DERMACEA 4 6PLY (GAUZE/BANDAGES/DRESSINGS) ×1
CANISTER SUCT 3000ML PPV (MISCELLANEOUS) IMPLANT
COVER SURGICAL LIGHT HANDLE (MISCELLANEOUS) ×1 IMPLANT
CUFF TOURN SGL QUICK 18X4 (TOURNIQUET CUFF) ×1 IMPLANT
CUFF TOURN SGL QUICK 24 (TOURNIQUET CUFF)
CUFF TRNQT CYL 24X4X16.5-23 (TOURNIQUET CUFF) IMPLANT
DRAPE C-ARM 42X72 X-RAY (DRAPES) ×1 IMPLANT
DRAPE IMP U-DRAPE 54X76 (DRAPES) ×1 IMPLANT
DRAPE INCISE IOBAN 66X45 STRL (DRAPES) ×1 IMPLANT
DRAPE OEC MINIVIEW 54X84 (DRAPES) IMPLANT
DRAPE U-SHAPE 47X51 STRL (DRAPES) ×1 IMPLANT
DRSG TEGADERM 4X4.75 (GAUZE/BANDAGES/DRESSINGS) ×4 IMPLANT
ELECT CAUTERY BLADE 6.4 (BLADE) ×1 IMPLANT
ELECT REM PT RETURN 9FT ADLT (ELECTROSURGICAL) ×1
ELECTRODE REM PT RTRN 9FT ADLT (ELECTROSURGICAL) ×1 IMPLANT
FACESHIELD WRAPAROUND (MASK) ×1 IMPLANT
FACESHIELD WRAPAROUND OR TEAM (MASK) ×1 IMPLANT
GAUZE PAD ABD 8X10 STRL (GAUZE/BANDAGES/DRESSINGS) IMPLANT
GAUZE SPONGE 4X4 12PLY STRL (GAUZE/BANDAGES/DRESSINGS) ×1 IMPLANT
GAUZE SPONGE 4X4 12PLY STRL LF (GAUZE/BANDAGES/DRESSINGS) IMPLANT
GAUZE XEROFORM 1X8 LF (GAUZE/BANDAGES/DRESSINGS) ×1 IMPLANT
GAUZE XEROFORM 5X9 LF (GAUZE/BANDAGES/DRESSINGS) ×1 IMPLANT
GLOVE BIOGEL PI IND STRL 7.0 (GLOVE) ×2 IMPLANT
GLOVE BIOGEL PI IND STRL 7.5 (GLOVE) ×1 IMPLANT
GLOVE ECLIPSE 7.0 STRL STRAW (GLOVE) ×1 IMPLANT
GLOVE SKINSENSE STRL SZ7.5 (GLOVE) ×4 IMPLANT
GLOVE SURG SYN 7.5  E (GLOVE) ×2
GLOVE SURG SYN 7.5 E (GLOVE) ×2 IMPLANT
GLOVE SURG SYN 7.5 PF PI (GLOVE) ×2 IMPLANT
GLOVE SURG UNDER POLY LF SZ7 (GLOVE) ×19 IMPLANT
GLOVE SURG UNDER POLY LF SZ7.5 (GLOVE) ×8 IMPLANT
GOWN STRL REIN XL XLG (GOWN DISPOSABLE) ×2 IMPLANT
KIT BASIN OR (CUSTOM PROCEDURE TRAY) ×1 IMPLANT
KIT TURNOVER KIT B (KITS) ×1 IMPLANT
MANIFOLD NEPTUNE II (INSTRUMENTS) ×1 IMPLANT
NDL TAPERED W/ NITINOL LOOP (MISCELLANEOUS) IMPLANT
NEEDLE TAPERED W/ NITINOL LOOP (MISCELLANEOUS) ×1 IMPLANT
NS IRRIG 1000ML POUR BTL (IV SOLUTION) ×1 IMPLANT
PACK SHOULDER (CUSTOM PROCEDURE TRAY) ×1 IMPLANT
PACK UNIVERSAL I (CUSTOM PROCEDURE TRAY) ×1 IMPLANT
PAD ARMBOARD 7.5X6 YLW CONV (MISCELLANEOUS) ×2 IMPLANT
PAD CAST 4YDX4 CTTN HI CHSV (CAST SUPPLIES) ×1 IMPLANT
PADDING CAST COTTON 4X4 STRL (CAST SUPPLIES) ×1
SLING ARM FOAM STRAP LRG (SOFTGOODS) IMPLANT
SLING ARM IMMOBILIZER LRG (SOFTGOODS) ×1 IMPLANT
SPLINT FIBERGLASS 3X35 (CAST SUPPLIES) IMPLANT
SPONGE T-LAP 18X18 ~~LOC~~+RFID (SPONGE) ×2 IMPLANT
STAPLER VISISTAT 35W (STAPLE) IMPLANT
STRIP CLOSURE SKIN 1/2X4 (GAUZE/BANDAGES/DRESSINGS) IMPLANT
SUCTION FRAZIER HANDLE 10FR (MISCELLANEOUS) ×1
SUCTION TUBE FRAZIER 10FR DISP (MISCELLANEOUS) IMPLANT
SUT ETHILON 3 0 PS 1 (SUTURE) ×2 IMPLANT
SUT LASSO 45 DEG R (SUTURE) IMPLANT
SUT VIC AB 0 CT1 27 (SUTURE) ×1
SUT VIC AB 0 CT1 27XBRD ANBCTR (SUTURE) ×1 IMPLANT
SUT VIC AB 2-0 CT1 27 (SUTURE) ×2
SUT VIC AB 2-0 CT1 TAPERPNT 27 (SUTURE) ×1 IMPLANT
SYR CONTROL 10ML LL (SYRINGE) IMPLANT
SYSTEM ARTHRO FOR DISTAL BICEP (Orthopedic Implant) IMPLANT
TOWEL GREEN STERILE (TOWEL DISPOSABLE) ×1 IMPLANT
TOWEL GREEN STERILE FF (TOWEL DISPOSABLE) IMPLANT
UNDERPAD 30X36 HEAVY ABSORB (UNDERPADS AND DIAPERS) ×1 IMPLANT
WATER STERILE IRR 1000ML POUR (IV SOLUTION) ×1 IMPLANT

## 2022-03-03 NOTE — Anesthesia Postprocedure Evaluation (Signed)
Anesthesia Post Note  Patient: Lee [name]  Procedure(s) Performed: LEFT DISTAL BICEPS TENDON REPAIR (Left: Arm Lower)     Patient location during evaluation: Phase II Anesthesia Type: Regional Level of consciousness: awake Pain management: pain level controlled Vital Signs Assessment: post-procedure vital signs reviewed and stable Respiratory status: spontaneous breathing Cardiovascular status: stable Postop Assessment: no apparent nausea or vomiting Anesthetic complications: no   No notable events documented.  Last Vitals:  Vitals:   03/03/22 0943 03/03/22 1430  BP: (!) 148/94 106/78  Pulse: 87   Resp: 18 20  Temp: 36.8 C 36.4 C  SpO2: 98%     Last Pain:  Vitals:   03/03/22 1430  TempSrc:   PainSc: 0-No pain                 Huston Foley

## 2022-03-03 NOTE — Anesthesia Procedure Notes (Signed)
Procedure Name: LMA Insertion Date/Time: 03/03/2022 12:32 PM  Performed by: Minerva Ends, CRNAPre-anesthesia Checklist: Patient identified, Emergency Drugs available, Suction available and Patient being monitored Patient Re-evaluated:Patient Re-evaluated prior to induction Oxygen Delivery Method: Circle system utilized Preoxygenation: Pre-oxygenation with 100% oxygen Induction Type: IV induction Ventilation: Mask ventilation with difficulty, Two handed mask ventilation required and Oral airway inserted - appropriate to patient size LMA: LMA inserted LMA Size: 4.0 Tube type: Oral Number of attempts: 2 Placement Confirmation: positive ETCO2 and breath sounds checked- equal and bilateral Tube secured with: Tape Dental Injury: Teeth and Oropharynx as per pre-operative assessment  Comments: Attempt 1: unable to place LMA 5. 2 hand bag necessary in between attempts  Attempt 2: LMA 4 successfully placed

## 2022-03-03 NOTE — Progress Notes (Signed)
Patient has been cleared for surgery.  Clearance in  [facility] on 02/08/22.

## 2022-03-03 NOTE — Op Note (Addendum)
Date of Surgery: 03/03/2022  INDICATIONS: Mr. Burggraf is a 71 y.o.-year-old male with a recurrent rupture of left distal biceps tendon status post repair.  He was 1 week status post repair when he felt a pop in his elbow while getting up from a chair.  An MRI showed a re-rupture.  The patient did consent to the procedure after discussion of the risks and benefits.  PREOPERATIVE DIAGNOSIS: Re-rupture of left distal biceps tendon  POSTOPERATIVE DIAGNOSIS: Same.  PROCEDURE:  Reinsertion of left distal biceps tendon Tenolysis of biceps tendon  OPERATIVE FINDINGS: Failure of fixation of the distal biceps tendon from the fixation device  SURGEON: N. Eduard Roux, M.D.  ASSIST: Ciro Backer [place], [place]; necessary for the timely completion of procedure and due to complexity of procedure.  ANESTHESIA:  general, regional  IV FLUIDS AND URINE: See anesthesia.  ESTIMATED BLOOD LOSS: 25 mL.  IMPLANTS:  Implant Name Type Inv. Item Serial No. Manufacturer Lot No. LRB No. Used Action  SYSTEM ARTHRO FOR DISTAL BICEP - [id number] Orthopedic Implant SYSTEM ARTHRO FOR DISTAL BICEP  [place] [id number] Left 1 Implanted    DRAINS: None  COMPLICATIONS: see description of procedure.  DESCRIPTION OF PROCEDURE: The patient was brought to the operating room.  The patient had been signed prior to the procedure and this was documented. The patient had the anesthesia placed by the anesthesiologist.  A time-out was performed to confirm that this was the correct patient, site, side and location. The patient did receive antibiotics prior to the incision and was re-dosed during the procedure as needed at indicated intervals.  A sterile tourniquet was placed after the patient had the operative extremity prepped and draped in the standard surgical fashion.    The previous incision was incised and curved proximally.  Blunt dissection was carefully taken down through subcutaneous layer.  The previous interval  between the pronator teres and brachioradialis was utilized and bluntly dissected using my finger.  The basilic vein was mobilized proximally.  The biceps tendon was mobilized and tenolysis was performed from the surrounding brachialis muscle and the lacertus.  Retractors were then placed and scar tissue was removed and I was able to see that the fixation of the tendon had failed where it was attached to the previous device.  The tendon itself did not rupture.  The previous suture tapes were removed.  I attempted to remove the previous button but given the location and the proximity of the PIN nerve I did not feel that this was worth the risk of creating an iatrogenic PIN injury therefore I made the decision to leave the previous button in its place.  The sutures were removed.  The old bone tunnel was found and cleaned out with a small rondure.  Excess suture was removed.  The far cortical hole was enlarged to accept the new Arthrex button.  The biceps tendon was then whipstitched and the sutures were delivered through the button and the button was delivered through the far cortex and flipped and confirmed under fluoroscopy.  The elbow was then brought into flexion and the tendon was delivered into the radial tuberosity.  I was very happy with how much of the tendon was delivered down into the bone.  I then backed up to fixation with a 7 x 10 interference screw which had excellent purchase.  The free ends of the suture were then brought out and sutured through the distal end of the tendon once again for added fixation.  The surgical site was then thoroughly irrigated.  Gentle elbow range of motion showed an excellent repair without any gapping.  The previous skin edges were removed with a 15 blade.  Layered closure was performed.  Sterile dressings were applied and a long-arm splint was placed with the elbow at 90 degrees.  Patient tolerated procedure well had no many complications.  Tawanna Cooler was necessary  for opening, closing, retracting, limb positioning and overall facilitation and timely completion of the procedure.  POSTOPERATIVE PLAN: Patient will be discharged home and follow-up in a week for suture removal and initiation of elbow range of motion.  Azucena Cecil, MD 2:04 PM

## 2022-03-03 NOTE — Transfer of Care (Signed)
Immediate Anesthesia Transfer of Care Note  Patient: Lee [name]  Procedure(s) Performed: LEFT DISTAL BICEPS TENDON REPAIR (Left: Arm Lower)  Patient Location: PACU  Anesthesia Type:General  Level of Consciousness: awake  Airway & Oxygen Therapy: Patient Spontanous Breathing  Post-op Assessment: Report given to RN and Post -op Vital signs reviewed and stable  Post vital signs: Reviewed and stable  Last Vitals:  Vitals Value Taken Time  BP 111/73 03/03/22 1426  Temp 36   Pulse 73 03/03/22 1429  Resp 17 03/03/22 1429  SpO2 98 % 03/03/22 1429  Vitals shown include unvalidated device data.  Last Pain:  Vitals:   03/03/22 0943  TempSrc: Oral         Complications: No notable events documented.

## 2022-03-03 NOTE — Anesthesia Procedure Notes (Signed)
Anesthesia Regional Block: Supraclavicular block   Pre-Anesthetic Checklist: , timeout performed,  Correct Patient, Correct Site, Correct Laterality,  Correct Procedure, Correct Position, site marked,  Risks and benefits discussed,  Surgical consent,  Pre-op evaluation,  At surgeon's request and post-op pain management  Laterality: Upper and Left  Prep: chloraprep       Needles:  Injection technique: Single-shot  Needle Type: Echogenic Stimulator Needle     Needle Length: 9cm  Needle Gauge: 20   Needle insertion depth: 2 cm   Additional Needles:   Procedures:,,,, ultrasound used (permanent image in chart),,    Narrative:  Start time: 03/03/2022 1:55 PM End time: 03/03/2022 12:04 PM Injection made incrementally with aspirations every 5 mL.  Performed by: Personally  Anesthesiologist: Lyn Hollingshead, MD

## 2022-03-03 NOTE — Progress Notes (Signed)
Remote pacemaker transmission.   

## 2022-03-03 NOTE — Discharge Instructions (Signed)

## 2022-03-04 ENCOUNTER — Encounter (HOSPITAL_COMMUNITY): Payer: Self-pay | Admitting: Orthopaedic Surgery

## 2022-03-07 ENCOUNTER — Other Ambulatory Visit: Payer: Self-pay | Admitting: Family Medicine

## 2022-03-08 NOTE — H&P (Signed)

## 2022-03-10 ENCOUNTER — Ambulatory Visit (INDEPENDENT_AMBULATORY_CARE_PROVIDER_SITE_OTHER): Payer: Medicare Other

## 2022-03-10 ENCOUNTER — Ambulatory Visit (INDEPENDENT_AMBULATORY_CARE_PROVIDER_SITE_OTHER): Payer: Medicare Other | Admitting: Physician Assistant

## 2022-03-10 DIAGNOSIS — M25522 Pain in left elbow: Secondary | ICD-10-CM

## 2022-03-10 NOTE — Progress Notes (Signed)
Office Visit Note   Patient: Lee [name]           Date of Birth: 1951/03/02           MRN: [id number] Visit Date: 03/10/2022              Requested by: Susy Frizzle, MD [address] [place],  [place] [address] PCP: Susy Frizzle, MD   Assessment & Plan: Visit Diagnoses:  1. Pain in left elbow       Follow-Up Instructions: Return in about 1 week (around 03/17/2022).   Orders:  Orders Placed This Encounter  Procedures   XR Elbow Complete Left (3+View)   No orders of the defined types were placed in this encounter.     Procedures: No procedures performed   Clinical Data: No additional findings.   Subjective: Chief Complaint  Patient presents with   Left Elbow - Routine Post Op    HPI patient is a pleasant 71 year old gentleman who comes in today 1 week status post 3 repair left distal biceps rupture 03/03/2022.  He has been doing well.  He has been compliant wearing a long-arm splint and sling.  He has minimal to no pain.  Examination of his left elbow without the splint reveals a well-healing surgical incision with nylon sutures in place.  He does have 1 small area to the proximal incision with slight bleeding.  No evidence of infection or cellulitis.  Fingers warm well perfused.  He is neurovascularly intact distally.  At this point, he will wear a sling for comfort as well as in public.  He will work on gentle range of motion of the elbow.  He will follow-up with [place] next week for suture removal.  Call with concerns or questions in the meantime.     Objective: Vital Signs: There were no vitals taken for this visit.      Specialty Comments:  No specialty comments available.  Imaging: XR Elbow Complete Left (3+View)  Result Date: 03/10/2022 X-rays demonstrate stable alignment of the Endobutton.    PMFS History: Patient Active Problem List   Diagnosis Date Noted   Rupture of left distal biceps tendon 02/09/2022   Pain in left  elbow 01/28/2022   Chronic migraine without aura with status migrainosus, not intractable 01/27/2022   Primary osteoarthritis of first carpometacarpal joint of right hand 09/02/2021   Pain in right knee 02/12/2020   Second degree Mobitz II AV block 04/30/2019   Syncope and collapse 10/10/2018   Heart block AV complete ([place]) 10/10/2018   Symptomatic bradycardia 10/10/2018   Syncope, vasovagal 09/20/2018   Neck mass 10/14/2016   Depression 05/20/2016   Hypertensive heart disease    Paroxysmal atrial fibrillation ([place])    Hyperlipidemia LDL goal <70 03/27/2014   Premature ventricular contraction 03/04/2014   Chest pain with high risk of acute coronary syndrome 06/28/2013   Morbid obesity ([place]) 06/28/2013   OSA (obstructive sleep apnea) 12/18/2012   Sleepiness 11/06/2012   Fatigue 11/06/2012   PALPITATIONS 06/02/2009   DYSLIPIDEMIA 09/05/2008   Essential hypertension 09/05/2008   CAD S/P percutaneous coronary angioplasty 09/05/2008   RBBB 09/05/2008   SUPRAVENTRICULAR TACHYCARDIA 09/05/2008   ALLERGIC RHINITIS 09/05/2008   GERD 09/05/2008   BACK PAIN 09/05/2008   ARRHYTHMIA, HX OF 09/05/2008   MIGRAINES, HX OF 09/05/2008   Dyslipidemia 09/05/2008   Past Medical History:  Diagnosis Date   Allergic rhinitis    chronic sinusitis   Arthritis  osetoarthritis   CAD (coronary artery disease)    a. 2009 PCI/DES to mLAD; b. 05/2010 s/p DES RCA and LAD.; c. 05/2013 Cath: LAD mild ISR, LCX nl, RCA 40p, patent stents.   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   Cataract    has had lasik surg   COPD (chronic obstructive pulmonary disease) ([place])    Depression    Diabetes mellitus type 2, controlled, without complications ([place])    Diabetes mellitus without complication ([place])    Phreesia 08/22/2020   Diverticulosis    DJD (degenerative joint disease)    Esophagitis 1991   grade 1   GERD (gastroesophageal reflux disease)    Hiatal hernia   Hearing loss    wears  bilateral hearing aids   Heart murmur    Dr Dennard Schaumann dx per patient   Hemorrhoids    Hyperlipidemia    Hypertension    Hypertensive heart disease    Iron deficiency anemia    Migraines    Myocardial infarction [facility])    Paroxysmal atrial fibrillation ([place])    a. s/p afib ablation 10/22/08 (J. Allred).   PVC's (premature ventricular contractions)    Sleep apnea    Uses nightly.  Questionalble: RDI during the total sleep time 6h 28 mins was 3.55/hr during REM sleep was at 5.71/hr. Supine AHI was 5.93/hr.   Syncope 08/2018    Family History  Problem Relation Age of Onset   Colon cancer Mother        mets from uterine   Uterine cancer Mother    Heart disease Father        and sister   Diabetes Father    Hearing loss Father    Stroke Father    Heart disease Sister    Lung cancer Sister    Stroke Brother     Past Surgical History:  Procedure Laterality Date   ABDOMINAL HYSTERECTOMY     CARDIAC CATHETERIZATION  05/31/2010   The proximal LAD was then predilated with 2.0 x 12 trek. this was then stented with a 2.5 x 16 promus Element drug-eluting stent at 14 atmosphere and prostdilated with 2.75 x 12 [place] trek at 16 atmospheres(2.8 mm) resulting the reduction of 80% proximal LAD stenosis to 0% residual with excellent flow.   CARDIAC CATHETERIZATION  06/05/2010   Successful percutaneous coronary intervention to the right coronary artery with percutaneous transluminal coronary angioplasty/stenting and insertion 3.0 x 16 mm Promus DES post dilated to 3.35 mm with stenosis being reduced to 0%   CARDIAC CATHETERIZATION  02/29/2008   Post dilatation was performed using a 2.75 x 9 [place] sprinter, 10 atmospheres for 40 seconds and then 9 atmosphere for 35 sec. This resulted in the 80% area of narrowing pre-intervention, now appearing to normal. There was no edvidence of the dissection or thrombus and there was TIMI III flow pre and post.   CARDIAC CATHETERIZATION  02/28/2006   CORONARY ANGIOPLASTY  WITH STENT PLACEMENT     3 stents/ 4 caths   DISTAL BICEPS TENDON REPAIR Left 02/12/2022   Procedure: LEFT DISTAL BICEPS TENDON REPAIR;  Surgeon: Leandrew Koyanagi, MD;  Location: [facility];  Service: Orthopedics;  Laterality: Left;   DISTAL BICEPS TENDON REPAIR Left 03/03/2022   Procedure: LEFT DISTAL BICEPS TENDON REPAIR;  Surgeon: Leandrew Koyanagi, MD;  Location: [facility];  Service: Orthopedics;  Laterality: Left;   EYE SURGERY     FINGER ARTHROPLASTY Right 09/02/2021  Procedure: RIGHT THUMB CARPOMETACARPAL ARTHROPLASTY;  Surgeon: Leandrew Koyanagi, MD;  Location: [facility];  Service: Orthopedics;  Laterality: Right;  block in preop   KNEE ARTHROSCOPY     right   LASIK     LEFT HEART CATHETERIZATION WITH CORONARY ANGIOGRAM N/A 06/29/2013   Procedure: LEFT HEART CATHETERIZATION WITH CORONARY ANGIOGRAM;  Surgeon: Leonie Man, MD;  Location: [facility] CATH LAB;  Service: Cardiovascular;  Laterality: N/A;   NASAL SINUS SURGERY  06/01/1999   NECK SURGERY     Cervical fusion   PACEMAKER IMPLANT N/A 05/01/2019   Procedure: PACEMAKER IMPLANT;  Surgeon: [name], MD;  Location: [facility] CV LAB;  Service: Cardiovascular;  Laterality: N/A;   RADIOFREQUENCY ABLATION  09/28/2008   atrial fibrillation   ROTATOR CUFF REPAIR     left   SHOULDER OPEN ROTATOR CUFF REPAIR     right   SPINE SURGERY     WRIST ARTHROCENTESIS     left   Social History   Occupational History   Occupation: retired  Tobacco Use   Smoking status: Former    Packs/day: 2.00    Years: 25.00    Total pack years: 50.00    Types: Cigarettes, Cigars    Quit date: 04/22/1988    Years since quitting: 33.9   Smokeless tobacco: Never  Vaping Use   Vaping Use: Never used  Substance and Sexual Activity   Alcohol use: Not Currently   Drug use: No   Sexual activity: Not Currently    Birth control/protection: Surgical    Comment: vas

## 2022-03-17 ENCOUNTER — Ambulatory Visit (INDEPENDENT_AMBULATORY_CARE_PROVIDER_SITE_OTHER): Payer: Medicare Other | Admitting: Orthopaedic Surgery

## 2022-03-17 ENCOUNTER — Encounter: Payer: Self-pay | Admitting: Orthopaedic Surgery

## 2022-03-17 DIAGNOSIS — S46212A Strain of muscle, fascia and tendon of other parts of biceps, left arm, initial encounter: Secondary | ICD-10-CM

## 2022-03-17 NOTE — Progress Notes (Signed)
Post-Op Visit Note   Patient: Lee [name]           Date of Birth: 14-Aug-1950           MRN: [id number] Visit Date: 03/17/2022 PCP: Lee Frizzle, MD   Assessment & Plan:  Chief Complaint:  Chief Complaint  Patient presents with   Left Elbow - Follow-up    Left distal biceps tendon repair 03/03/2022   Visit Diagnoses:  1. Rupture of left distal biceps tendon, initial encounter     Plan: [name] is 2 weeks status post revision distal biceps repair on 03/03/2022.  He has pain that comes and goes but overall has no real complaints.  Examination of the left forearm shows a healed surgical incision without any sign of infection or drainage.  He has some slight numbness to the volar forearm.  No motor deficits.  Gentle range of motion is well-tolerated.  We remove the sutures in place Steri-Strips.  For now I would like him to work on just gentle range of motion exercises that I demonstrated.  Recheck in 4 weeks.  Anticipate will begin strengthening at that time.  Follow-Up Instructions: Return in about 4 weeks (around 04/14/2022).   Orders:  No orders of the defined types were placed in this encounter.  No orders of the defined types were placed in this encounter.   Imaging: No results found.  PMFS History: Patient Active Problem List   Diagnosis Date Noted   Rupture of left distal biceps tendon 02/09/2022   Pain in left elbow 01/28/2022   Chronic migraine without aura with status migrainosus, not intractable 01/27/2022   Primary osteoarthritis of first carpometacarpal joint of right hand 09/02/2021   Pain in right knee 02/12/2020   Second degree Mobitz II AV block 04/30/2019   Syncope and collapse 10/10/2018   Heart block AV complete ([place]) 10/10/2018   Symptomatic bradycardia 10/10/2018   Syncope, vasovagal 09/20/2018   Neck mass 10/14/2016   Depression 05/20/2016   Hypertensive heart disease    Paroxysmal atrial fibrillation ([place])    Hyperlipidemia LDL goal  <70 03/27/2014   Premature ventricular contraction 03/04/2014   Chest pain with high risk of acute coronary syndrome 06/28/2013   Morbid obesity ([place]) 06/28/2013   OSA (obstructive sleep apnea) 12/18/2012   Sleepiness 11/06/2012   Fatigue 11/06/2012   PALPITATIONS 06/02/2009   DYSLIPIDEMIA 09/05/2008   Essential hypertension 09/05/2008   CAD S/P percutaneous coronary angioplasty 09/05/2008   RBBB 09/05/2008   SUPRAVENTRICULAR TACHYCARDIA 09/05/2008   ALLERGIC RHINITIS 09/05/2008   GERD 09/05/2008   BACK PAIN 09/05/2008   ARRHYTHMIA, HX OF 09/05/2008   MIGRAINES, HX OF 09/05/2008   Dyslipidemia 09/05/2008   Past Medical History:  Diagnosis Date   Allergic rhinitis    chronic sinusitis   Arthritis    osetoarthritis   CAD (coronary artery disease)    a. 2009 PCI/DES to mLAD; b. 05/2010 s/p DES RCA and LAD.; c. 05/2013 Cath: LAD mild ISR, LCX nl, RCA 40p, patent stents.   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   Cataract    has had lasik surg   COPD (chronic obstructive pulmonary disease) ([place])    Depression    Diabetes mellitus type 2, controlled, without complications ([place])    Diabetes mellitus without complication ([place])    Phreesia 08/22/2020   Diverticulosis    DJD (degenerative joint disease)    Esophagitis 1991   grade 1  GERD (gastroesophageal reflux disease)    Hiatal hernia   Hearing loss    wears bilateral hearing aids   Heart murmur    Dr Dennard Schaumann dx per patient   Hemorrhoids    Hyperlipidemia    Hypertension    Hypertensive heart disease    Iron deficiency anemia    Migraines    Myocardial infarction [facility])    Paroxysmal atrial fibrillation ([place])    a. s/p afib ablation 10/22/08 ([facility]. [facility]).   PVC's (premature ventricular contractions)    Sleep apnea    Uses nightly.  Questionalble: RDI during the total sleep time 6h 28 mins was 3.55/hr during REM sleep was at 5.71/hr. Supine AHI was 5.93/hr.   Syncope 08/2018    Family History   Problem Relation Age of Onset   Colon cancer Mother        mets from uterine   Uterine cancer Mother    Heart disease Father        and sister   Diabetes Father    Hearing loss Father    Stroke Father    Heart disease Sister    Lung cancer Sister    Stroke Brother     Past Surgical History:  Procedure Laterality Date   ABDOMINAL HYSTERECTOMY     CARDIAC CATHETERIZATION  05/31/2010   The proximal LAD was then predilated with 2.0 x 12 trek. this was then stented with a 2.5 x 16 promus Element drug-eluting stent at 14 atmosphere and prostdilated with 2.75 x 12 [place] trek at 16 atmospheres(2.8 mm) resulting the reduction of 80% proximal LAD stenosis to 0% residual with excellent flow.   CARDIAC CATHETERIZATION  06/05/2010   Successful percutaneous coronary intervention to the right coronary artery with percutaneous transluminal coronary angioplasty/stenting and insertion 3.0 x 16 mm Promus DES post dilated to 3.35 mm with stenosis being reduced to 0%   CARDIAC CATHETERIZATION  02/29/2008   Post dilatation was performed using a 2.75 x 9 [place] sprinter, 10 atmospheres for 40 seconds and then 9 atmosphere for 35 sec. This resulted in the 80% area of narrowing pre-intervention, now appearing to normal. There was no edvidence of the dissection or thrombus and there was TIMI III flow pre and post.   CARDIAC CATHETERIZATION  02/28/2006   CORONARY ANGIOPLASTY WITH STENT PLACEMENT     3 stents/ 4 caths   DISTAL BICEPS TENDON REPAIR Left 02/12/2022   Procedure: LEFT DISTAL BICEPS TENDON REPAIR;  Surgeon: Leandrew Koyanagi, MD;  Location: [facility];  Service: Orthopedics;  Laterality: Left;   DISTAL BICEPS TENDON REPAIR Left 03/03/2022   Procedure: LEFT DISTAL BICEPS TENDON REPAIR;  Surgeon: Leandrew Koyanagi, MD;  Location: [place];  Service: Orthopedics;  Laterality: Left;   EYE SURGERY     FINGER ARTHROPLASTY Right 09/02/2021   Procedure: RIGHT THUMB CARPOMETACARPAL ARTHROPLASTY;  Surgeon: Leandrew Koyanagi, MD;  Location:  [facility];  Service: Orthopedics;  Laterality: Right;  block in preop   KNEE ARTHROSCOPY     right   LASIK     LEFT HEART CATHETERIZATION WITH CORONARY ANGIOGRAM N/A 06/29/2013   Procedure: LEFT HEART CATHETERIZATION WITH CORONARY ANGIOGRAM;  Surgeon: Leonie Man, MD;  Location: [facility] CATH LAB;  Service: Cardiovascular;  Laterality: N/A;   NASAL SINUS SURGERY  06/01/1999   NECK SURGERY     Cervical fusion   PACEMAKER IMPLANT N/A 05/01/2019   Procedure: PACEMAKER IMPLANT;  Surgeon: [name], MD;  Location: [facility] CV  LAB;  Service: Cardiovascular;  Laterality: N/A;   RADIOFREQUENCY ABLATION  09/28/2008   atrial fibrillation   ROTATOR CUFF REPAIR     left   SHOULDER OPEN ROTATOR CUFF REPAIR     right   SPINE SURGERY     WRIST ARTHROCENTESIS     left   Social History   Occupational History   Occupation: retired  Tobacco Use   Smoking status: Former    Packs/day: 2.00    Years: 25.00    Total pack years: 50.00    Types: Cigarettes, Cigars    Quit date: 04/22/1988    Years since quitting: 33.9   Smokeless tobacco: Never  Vaping Use   Vaping Use: Never used  Substance and Sexual Activity   Alcohol use: Not Currently   Drug use: No   Sexual activity: Not Currently    Birth control/protection: Surgical    Comment: vas

## 2022-03-24 LAB — HM DIABETES EYE EXAM

## 2022-03-29 ENCOUNTER — Encounter: Payer: Self-pay | Admitting: Cardiovascular Disease

## 2022-03-29 ENCOUNTER — Ambulatory Visit (INDEPENDENT_AMBULATORY_CARE_PROVIDER_SITE_OTHER): Payer: Medicare Other | Admitting: Family Medicine

## 2022-03-29 ENCOUNTER — Ambulatory Visit: Payer: Medicare Other | Attending: Cardiovascular Disease | Admitting: Cardiovascular Disease

## 2022-03-29 ENCOUNTER — Encounter: Payer: Self-pay | Admitting: Family Medicine

## 2022-03-29 VITALS — BP 128/82 | HR 76 | Ht 68.0 in | Wt 216.6 lb

## 2022-03-29 VITALS — BP 120/62 | HR 85 | Ht 68.0 in | Wt 217.0 lb

## 2022-03-29 DIAGNOSIS — E785 Hyperlipidemia, unspecified: Secondary | ICD-10-CM | POA: Diagnosis not present

## 2022-03-29 DIAGNOSIS — I480 Paroxysmal atrial fibrillation: Secondary | ICD-10-CM | POA: Diagnosis not present

## 2022-03-29 DIAGNOSIS — Z9861 Coronary angioplasty status: Secondary | ICD-10-CM

## 2022-03-29 DIAGNOSIS — I10 Essential (primary) hypertension: Secondary | ICD-10-CM | POA: Diagnosis not present

## 2022-03-29 DIAGNOSIS — G4733 Obstructive sleep apnea (adult) (pediatric): Secondary | ICD-10-CM | POA: Diagnosis not present

## 2022-03-29 DIAGNOSIS — E119 Type 2 diabetes mellitus without complications: Secondary | ICD-10-CM

## 2022-03-29 DIAGNOSIS — E118 Type 2 diabetes mellitus with unspecified complications: Secondary | ICD-10-CM | POA: Diagnosis not present

## 2022-03-29 DIAGNOSIS — I2510 Atherosclerotic heart disease of native coronary artery without angina pectoris: Secondary | ICD-10-CM | POA: Diagnosis not present

## 2022-03-29 DIAGNOSIS — E669 Obesity, unspecified: Secondary | ICD-10-CM

## 2022-03-29 DIAGNOSIS — Z23 Encounter for immunization: Secondary | ICD-10-CM

## 2022-03-29 DIAGNOSIS — Z950 Presence of cardiac pacemaker: Secondary | ICD-10-CM

## 2022-03-29 DIAGNOSIS — G43701 Chronic migraine without aura, not intractable, with status migrainosus: Secondary | ICD-10-CM

## 2022-03-29 MED ORDER — TIRZEPATIDE 7.5 MG/0.5ML ~~LOC~~ SOAJ: 7.5000 mg | SUBCUTANEOUS | 1 refills | Status: DC

## 2022-03-29 NOTE — Progress Notes (Signed)
Patient ID: Lee [name], male   DOB: 1951/02/22, 71 y.o.   MRN: [id number]      Primary M.D.: Lee [name]  HPI: Lee [name], is a 71 y.o. male who presents to the office today for a 108month followup cardiology evaluation.  Lee [name] has known CAD and in 2009 underwent stenting of his mid LAD. In 2012 he underwent staged intervention with stenting of his proximal RCA and at a new site in his proximal LAD. A nuclear perfusion study February 2014 showed normal perfusion and function. In January 2015, he was seen by Lee [name] in the office with complaints of chest pressure. He underwent cardiac catheterization this chest pain which was done by Dr. Bryan [name] on 06/29/2013. Catheterization did not demonstrate significant restenosis of his LAD stents with mild 20% narrowing in the proximal stent and 40% mid stent. The circumflex was normal. His RCA had 40% narrowing just proximal to a widely patent stent in the mid vessel. Nitrate therapy was added to his medical regimen but due to significant nitrate mediated headaches this ultimately was discontinued.   In 2007, a sleep study  suggested upper airway resistance syndrome without overall sleep apnea with the exception of mild sleep apnea with REM sleep; at that time CPAP was not recommended. In June 2014 he was having symptoms suggestive of progressive sleep apnea. He ultimately underwent a sleep study. I do not have the specifics of this diagnostic polysomnogram but subsequently he did undergo a CPAP titration to apparently his AHI was 12 on his initial study and he dropped his oxygen from 96% to 83%. He ultimately was titrated up to a CPAP pressure of 11 cm.  Additional problems also include obesity , as well as a history of palpitations.  I had titrated his beta blocker therapy which improved his palpitations.  He remains active.  He denies any episodes of chest pain.  He denies presyncope or syncope.  A follow-up myocardial  perfusion study on 09/23/2015 showed an ejection fraction at 62%.  There were no ECG changes.  He had normal perfusion without scar or ischemia.  I  saw him in June 2018 at which time was experiencing occasional palpitations.  We discussed reduction of caffeine and I added low-dose Cardizem CD to his regimen.    When I  saw him in July 2019 he was doing well and was without recurrent chest pain.  He was continuing to use CPAP with 1 or percent compliance; [facility] was his DME.   He was hospitalized in April 2020 with syncope  suspected to be secondary to a vagal episode.  He was noted to have significant sinus bradycardia and diltiazem was stopped.  An echo Doppler study on September 20, 2018 showed an EF of 60 to 65% with grade 2 diastolic dysfunction, clinical vacation and aortic valve sclerosis.  He was discharged home with plans for monitoring on Coreg.  He was admitted on Oct 10, 2018 with recurrent syncope and was found to have significant pauses on his monitor and had a 36 seconds strip showing multiple pauses lasting 6, 7, 9, and 12 seconds.  He was initially found by EMS in complete heart block with a ventricular rate in the 20s which improved to 2-1 and eventually sinus rhythm.  He was admitted to the hospital, carvedilol was discontinued.  He was  evaluated by Lee [name] in the office in June 2020 and was doing well without further symptoms of bradycardia, palpitations  chest pain shortness of breath dizziness presyncope or syncope.  On May 01, 2019 he underwent insertion of a [employer] Jude dual-chamber permanent pacemaker by Lee [name] for symptomatic Mobitz 2 second-degree AV block.   I saw him in March 2021.  Since I last saw him, he received a new ResMed air sense 10 CPAP unit.  [facility] is his DME provider and had not yet been linked to our office.  He has had issues with sleep initiation but notices significant improvement with his CPAP device.  With difficulty with sleep  initiation he was prescribed zolpidem 5 mg to take as needed.  Last 24 2022.  Since his prior patient remained stable and denies any chest pain or shortness of breath.  He was continue to use CPAP therapy and typically was going to bed  at 11 PM but is up between 4 and 5 AM.  I obtained a new download  from September 22, 2020 through Oct 21, 2020.  Usage days excellent at 97%.  He was averaging 5 hours and 30 minutes of CPAP use per night.  At14 cm set pressure, AHI is 1.  He felt well and was unaware of any breakthrough snoring.  He had undergone laboratory by Dr. Tanya [name] and hemoglobin A1c was 6.6.  He continues to be stable on rosuvastatin 40 mg hyperlipidemia and LDL cholesterol was 61 in March 2022.  Since I last saw him, he has remained stable.  He continues to be evaluated by EP and undergoes remote device interrogation of his pacemaker which has been stable.  Since his last evaluation he continues to use CPAP but apparently has been using his daughter's CPAP machine.  He now has a Systems developer which is working well.  He recently required surgery for ruptured of the left distal biceps tendon and tolerated that well.  He admits to purposeful weight loss and has lost approximately 20 pounds.  He continues to be on amlodipine 10 mg, furosemide 40 mg daily, and telmisartan 40 mg daily for hypertension and is on Jardiance in addition to Trulicity for diabetes.  He remains active and denies chest pain or shortness of breath.  He presents for evaluation.  Past Medical History:  Diagnosis Date   Allergic rhinitis    chronic sinusitis   Arthritis    osetoarthritis   CAD (coronary artery disease)    a. 2009 PCI/DES to mLAD; b. 05/2010 s/p DES RCA and LAD.; c. 05/2013 Cath: LAD mild ISR, LCX nl, RCA 40p, patent stents.   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   Cataract    has had lasik surg   COPD (chronic obstructive pulmonary disease) (HCC)    Depression    Diabetes  mellitus type 2, controlled, without complications (HCC)    Diabetes mellitus without complication (HCC)    Phreesia 08/22/2020   Diverticulosis    DJD (degenerative joint disease)    Esophagitis 1991   grade 1   GERD (gastroesophageal reflux disease)    Hiatal hernia   Hearing loss    wears bilateral hearing aids   Heart murmur    Dr Lee [name] dx per patient   Hemorrhoids    Hyperlipidemia    Hypertension    Hypertensive heart disease    Iron deficiency anemia    Migraines    Myocardial infarction [facility])    Paroxysmal atrial fibrillation (HCC)    a. s/p afib ablation 10/22/08 ([facility]. [facility]).  PVC's (premature ventricular contractions)    Sleep apnea    Uses nightly.  Questionalble: RDI during the total sleep time 6h 28 mins was 3.55/hr during REM sleep was at 5.71/hr. Supine AHI was 5.93/hr.   Syncope 08/2018    Past Surgical History:  Procedure Laterality Date   ABDOMINAL HYSTERECTOMY     CARDIAC CATHETERIZATION  05/31/2010   The proximal LAD was then predilated with 2.0 x 12 trek. this was then stented with a 2.5 x 16 promus Element drug-eluting stent at 14 atmosphere and prostdilated with 2.75 x 12 [place] trek at 16 atmospheres(2.8 mm) resulting the reduction of 80% proximal LAD stenosis to 0% residual with excellent flow.   CARDIAC CATHETERIZATION  06/05/2010   Successful percutaneous coronary intervention to the right coronary artery with percutaneous transluminal coronary angioplasty/stenting and insertion 3.0 x 16 mm Promus DES post dilated to 3.35 mm with stenosis being reduced to 0%   CARDIAC CATHETERIZATION  02/29/2008   Post dilatation was performed using a 2.75 x 9 [place] sprinter, 10 atmospheres for 40 seconds and then 9 atmosphere for 35 sec. This resulted in the 80% area of narrowing pre-intervention, now appearing to normal. There was no edvidence of the dissection or thrombus and there was TIMI III flow pre and post.   CARDIAC CATHETERIZATION  02/28/2006   CORONARY  ANGIOPLASTY WITH STENT PLACEMENT     3 stents/ 4 caths   DISTAL BICEPS TENDON REPAIR Left 02/12/2022   Procedure: LEFT DISTAL BICEPS TENDON REPAIR;  Surgeon: Tarry Kos, MD;  Location: [facility] OR;  Service: Orthopedics;  Laterality: Left;   DISTAL BICEPS TENDON REPAIR Left 03/03/2022   Procedure: LEFT DISTAL BICEPS TENDON REPAIR;  Surgeon: Tarry Kos, MD;  Location: [facility] OR;  Service: Orthopedics;  Laterality: Left;   EYE SURGERY     FINGER ARTHROPLASTY Right 09/02/2021   Procedure: RIGHT THUMB CARPOMETACARPAL ARTHROPLASTY;  Surgeon: Tarry Kos, MD;  Location: [facility];  Service: Orthopedics;  Laterality: Right;  block in preop   KNEE ARTHROSCOPY     right   LASIK     LEFT HEART CATHETERIZATION WITH CORONARY ANGIOGRAM N/A 06/29/2013   Procedure: LEFT HEART CATHETERIZATION WITH CORONARY ANGIOGRAM;  Surgeon: Marykay Lex, MD;  Location: [facility] CATH LAB;  Service: Cardiovascular;  Laterality: N/A;   NASAL SINUS SURGERY  06/01/1999   NECK SURGERY     Cervical fusion   PACEMAKER IMPLANT N/A 05/01/2019   Procedure: PACEMAKER IMPLANT;  Surgeon: Hillis Range, MD;  Location: [facility] INVASIVE CV LAB;  Service: Cardiovascular;  Laterality: N/A;   RADIOFREQUENCY ABLATION  09/28/2008   atrial fibrillation   ROTATOR CUFF REPAIR     left   SHOULDER OPEN ROTATOR CUFF REPAIR     right   SPINE SURGERY     WRIST ARTHROCENTESIS     left    Allergies  Allergen Reactions   Ibuprofen Swelling    Whole body swelled   Other Shortness Of Breath    BETA BLOCKERS   Topamax [Topiramate] Other (See Comments)    Pt states it made his tongue feel "scalded" and he couldn't eat    Toprol Xl [Metoprolol Succinate] Other (See Comments)    Personality change   Metoprolol-Hctz Er Other (See Comments)    Indigestion,mouth raw    Current Outpatient Medications  Medication Sig Dispense Refill   ACCU-CHEK GUIDE test strip USE TO CHECK BLOOD SUGAR LEVELS TWICE DAILY. 100 strip 1   acetaminophen  (TYLENOL)  325 MG tablet Take 1-2 tablets (325-650 mg total) by mouth every 4 (four) hours as needed for mild pain.     amLODipine (NORVASC) 10 MG tablet TAKE 1 TABLET(10 MG) BY MOUTH DAILY 90 tablet 1   aspirin EC 81 MG tablet Take 81 mg by mouth at bedtime.     Blood Glucose Monitoring Suppl (ACCU-CHEK AVIVA PLUS) w/Device KIT Check BS bid E11.9 1 kit 1   cholecalciferol (VITAMIN D) 1000 UNITS tablet Take 1,000 Units by mouth at bedtime.      citalopram (CELEXA) 40 MG tablet TAKE 1 TABLET(40 MG) BY MOUTH DAILY 90 tablet 1   Coenzyme Q10 (CO Q 10) 100 MG CAPS Take 100 mg by mouth daily with breakfast.      fexofenadine (ALLEGRA) 180 MG tablet Take 180 mg by mouth daily with breakfast.      fluticasone (FLONASE) 50 MCG/ACT nasal spray INSTILL 2 SPRAYS IN THE  NOSE AT BEDTIME 48 g 3   furosemide (LASIX) 40 MG tablet TAKE 1 TABLET BY MOUTH  DAILY 90 tablet 3   Galcanezumab-gnlm (EMGALITY) 120 MG/ML SOAJ Inject 120 mg into the skin every 30 (thirty) days. 1.12 mL 11   JARDIANCE 25 MG TABS tablet TAKE 1 TABLET BY MOUTH DAILY BEFORE BREAKFAST 90 tablet 1   Lancets Misc. (ACCU-CHEK FASTCLIX LANCET) KIT USE AS DIRECTED TO CHECK BLOOD SUGAR TWICE DAILY 1 kit 1   Multiple Vitamin (MULTIVITAMIN WITH MINERALS) TABS tablet Take 1 tablet by mouth daily with breakfast.     nitroGLYCERIN (NITROSTAT) 0.4 MG SL tablet PLACE ONE TABLET UNDER THE TONGUE EVERY 5 MINUTES AS NEEDED FOR CHEST PAIN 25 tablet 3   oxyCODONE-acetaminophen (PERCOCET) 10-325 MG tablet Take 1 tablet by mouth every 8 (eight) hours as needed for pain. 21 tablet 0   oxyCODONE-acetaminophen (PERCOCET) 5-325 MG tablet Take 1-2 tablets by mouth 2 (two) times daily as needed for severe pain. 20 tablet 0   pantoprazole (PROTONIX) 40 MG tablet TAKE 1 TABLET BY MOUTH  DAILY 90 tablet 3   Polyvinyl Alcohol-Povidone (REFRESH OP) Place 1 drop into both eyes at bedtime as needed (dry eyes).     potassium chloride (MICRO-K) 10 MEQ CR capsule TAKE 1 CAPSULE BY  MOUTH  DAILY 90 capsule 3   Rimegepant Sulfate 75 MG TBDP Take 75 mg by mouth daily as needed (Migraine).     rosuvastatin (CRESTOR) 40 MG tablet TAKE 1 TABLET BY MOUTH  DAILY 90 tablet 3   telmisartan (MICARDIS) 40 MG tablet TAKE 1 TABLET BY MOUTH  DAILY 90 tablet 3   tirzepatide (MOUNJARO) 7.5 MG/0.5ML Pen Inject 7.5 mg into the skin once a week. 6 mL 1   TRULICITY 3 MG/0.5ML SOPN ADMINISTER 3MG  UNDER THE SKIN 1 TIME A WEEK AS DIRECTED (Patient taking differently: Takes on Friday) 2 mL 2   No current facility-administered medications for this visit.    Socially he's married has 2 children he works in Environmental education officer. There is a remote tobacco history but he quit in 1989.  ROS General: Negative; No fevers, chills, or night sweats; positive for obesity HEENT: Negative; No changes in vision or hearing, sinus congestion, difficulty swallowing Pulmonary: Negative; No cough, wheezing, shortness of breath, hemoptysis Cardiovascular: See history of present illness;  GI: Negative; No nausea, vomiting, diarrhea, or abdominal pain GU: Positive for GERD; No dysuria, hematuria, or difficulty voiding Musculoskeletal: Status post rupture of the left distal biceps tendon status post revision and repair March 03, 2022. Hematologic/Oncology:  Negative; no easy bruising, bleeding Endocrine: Negative; no heat/cold intolerance; no diabetes Neuro: Positive for history of migraine headaches; no changes in balance,  Skin: Negative; No rashes or skin lesions Psychiatric: Negative; No behavioral problems, depression Sleep: Positive for obstructive sleep apnea,  on CPAP therapy; No snoring, daytime sleepiness, hypersomnolence, bruxism, restless legs, hypnogognic hallucinations, no cataplexy Other comprehensive 14 point system review is negative.  PE BP 128/82   Pulse 76   Ht 5\' 8"  (1.727 m)   Wt 216 lb 9.6 oz (98.2 kg)   SpO2 99%   BMI 32.93 kg/m   Morbid obesity  Repeat blood pressure by me was  126/72  Wt Readings from Last 3 Encounters:  03/29/22 216 lb 9.6 oz (98.2 kg)  03/29/22 217 lb (98.4 kg)  03/03/22 210 lb (95.3 kg)   General: Alert, oriented, no distress.  Skin: normal turgor, no rashes, warm and dry HEENT: Normocephalic, atraumatic. Pupils equal round and reactive to light; sclera anicteric; extraocular muscles intact;  Nose without nasal septal hypertrophy Mouth/Parynx benign; Mallinpatti scale 3 Neck: No JVD, no carotid bruits; normal carotid upstroke Lungs: clear to ausculatation and percussion; no wheezing or rales Chest wall: without tenderness to palpitation Heart: PMI not displaced, RRR, s1 s2 normal, 1-2/6 systolic murmur, no diastolic murmur, no rubs, gallops, thrills, or heaves Abdomen: soft, nontender; no hepatosplenomehaly, BS+; abdominal aorta nontender and not dilated by palpation. Back: no CVA tenderness Pulses 2+ Musculoskeletal: full range of motion, normal strength, no joint deformities Extremities: no clubbing cyanosis or edema, Homan's sign negative  Neurologic: grossly nonfocal; Cranial nerves grossly wnl Psychologic: Normal mood and affect    March 29, 2022   ECG (independently read by me): Atrial sensed, V paced at 76 , PR 244 msec  Oct 21, 2020 ECG (independently read by me):  Atrial-sensed and ventricular paced rhythm with prolonged AV conduction, PR interval 240 ms.  March 2021 ECG (independently read by me): Atrial-sensed ventricular paced rhythm with prolonged AV conduction with PR interval 246 ms.  September 2020 ECG (independently read by me): Sinus rhythm at 83 bpm with first-degree AV block with a PR of 220 ms.  Right bundle branch block with repolarization changes.  July 2019 ECG (independently read by me): Normal sinus rhythm at 66 bpm.  First-degree AV block.  Right bundle branch block with repolarization changes.  June 2018 ECG (independently read by me): Normal sinus rhythm at 65 bpm.  First degree AV block.  Right bundle  branch block with repolarization changes.  PR interval 218 ms.  July 2017 ECG (independently read by me): Sinus rhythm at 69 bpm with right bundle branch block and first-degree AV block.  Q wave is present in lead 3.  April 2016 ECG (independently read by me): Normal sinus rhythm at 68 with right bundle branch block and repolarization changes.  First-degree AV block.  No ectopy  October 2015 ECG (independently read by me): Normal sinus rhythm with right bundle branch block.  Borderline first-degree AV block with PR interval 206 ms.  No ectopy  Prior February 2015 ECG (independently read by me): Normal sinus rhythm at 66 beats per minute, right bundle branch block with repolarization changes, first-degree AV block.  ECG from 12/12/2012: NSR at 69, RBBB  LABS:     Latest Ref Rng & Units 02/12/2022    9:55 AM 12/07/2021    2:31 PM 08/25/2021    2:23 PM  BMP  Glucose 70 - 99 mg/dL 401  027  89  BUN 8 - 23 mg/dL 14  13  15    Creatinine 0.61 - 1.24 mg/dL 5.78  4.69  6.29   BUN/Creat Ratio 6 - 22 (calc)  NOT APPLICABLE    Sodium 135 - 145 mmol/L 136  136  135   Potassium 3.5 - 5.1 mmol/L 4.5  4.5  4.6   Chloride 98 - 111 mmol/L 101  100  100   CO2 22 - 32 mmol/L 23  27  28    Calcium 8.9 - 10.3 mg/dL 9.6  9.3  9.5        Latest Ref Rng & Units 12/07/2021    2:31 PM 06/26/2021    8:04 AM 08/18/2020    8:03 AM  Hepatic Function  Total Protein 6.1 - 8.1 g/dL 6.6  6.9  6.7   AST 10 - 35 U/L 14  23  17    ALT 9 - 46 U/L 13  20  16    Total Bilirubin 0.2 - 1.2 mg/dL 0.4  0.6  0.5        Latest Ref Rng & Units 03/29/2022   10:16 AM 02/12/2022    9:55 AM 12/07/2021    2:31 PM  CBC  WBC 3.8 - 10.8 Thousand/uL 6.6  7.3  8.4   Hemoglobin 13.2 - 17.1 g/dL 52.8  41.3  24.4   Hematocrit 38.5 - 50.0 % 46.2  47.1  47.3   Platelets 140 - 400 Thousand/uL 257  247  261    Lab Results  Component Value Date   MCV 88.8 03/29/2022   MCV 89.7 02/12/2022   MCV 89.4 12/07/2021    BNP    Component  Value Date/Time   PROBNP 36.0 06/04/2010 2206    Lipid Panel     Component Value Date/Time   CHOL 121 03/29/2022 1016   CHOL 155 11/21/2012 1025   TRIG 101 03/29/2022 1016   TRIG 81 11/21/2012 1025   HDL 56 03/29/2022 1016   HDL 45 11/21/2012 1025   CHOLHDL 2.2 03/29/2022 1016   VLDL 10 09/13/2016 0822   LDLCALC 47 03/29/2022 1016   LDLCALC 94 11/21/2012 1025     RADIOLOGY: No results found.  IMPRESSION:  1. Essential hypertension   2. Coronary artery disease involving native coronary artery of native heart without angina pectoris   3. CAD S/P percutaneous coronary angioplasty   4. OSA (obstructive sleep apnea) on CPAP   5. Hyperlipidemia LDL goal <70   6. Cardiac pacemaker in situ   7. Chronic migraine without aura with status migrainosus, not intractable   8. Controlled type 2 diabetes mellitus without complication, without long-term current use of insulin (HCC)   9. Mild obesity     ASSESSMENT AND PLAN: Mr. Sanmiguel Is a 71 year old male who has CAD and is status post intervention to his proximal and mid LAD as well as his RCA.  A repeat cardiac catheterization by Dr. Herbie Baltimore did not demonstrate significant cardiac etiology to his chest pain. He was empirically started on nitrate therapy but this resulted in frequent migraine headaches.  He was hospitalized in April and again in May 2020 with syncope and was found to have multiple pauses ranging from 6,7,9 and 12 seconds and transient complete heart block with ventricular rate in the 20s.  He was off all AV nodal agents moving forward.  However due to current symptomatic Mobitz 2 second-degree AV block he underwent insertion of a dual-chamber [facility] Jude permanent pacemaker by Lee [name] on May 01, 2019.  He has continued to be stable with reference to his cardiac rhythm.  He is undergoing remote pacemaker checks with his most recent on March 02, 2022 events and with lead parameters and battery within normal limits.   Presently he is doing well without chest pain or shortness of breath.  His blood pressure today is stable on his regimen consisting of telmisartan 40 mg, amlodipine 10 mg, and furosemide.  He is on Trulicity and Jardiance for type 2 diabetes mellitus.  He also is on Emgality injection every 30 days for migraine headaches with significant improvement.  He underwent recent left distal bicep repair on March 03, 2022 and tolerated this well.  He continues to use CPAP and admits to 100% compliance.  I was unable to obtain a download today since he started his machine is not linked to our office.  He will continue current therapy.  He sees Dr. Tanya [name] we will be checking laboratory.  Most recent LDL cholesterol in July 2023 was excellent at 52 on rosuvastatin 40 mg.  I will see him in 1 year for follow-up evaluation or sooner as needed.   Lennette Bihari, MD, [facility]  03/30/2022 5:46 PM

## 2022-03-29 NOTE — Patient Instructions (Signed)
Medication Instructions:  The current medical regimen is effective;  continue present plan and medications.  *If you need a refill on your cardiac medications before your next appointment, please call your pharmacy*   Follow-Up: At [facility], you and your health needs are our priority.  As part of our continuing mission to provide you with exceptional heart care, we have created designated Provider Care Teams.  These Care Teams include your primary Cardiologist (physician) and Advanced Practice Providers (APPs -  Physician Assistants and Nurse Practitioners) who all work together to provide you with the care you need, when you need it.  We recommend signing up for the patient portal called "MyChart".  Sign up information is provided on this After Visit Summary.  MyChart is used to connect with patients for Virtual Visits (Telemedicine).  Patients are able to view lab/test results, encounter notes, upcoming appointments, etc.  Non-urgent messages can be sent to your provider as well.   To learn more about what you can do with MyChart, go to [email or web address].    Your next appointment:   12 month(s)  The format for your next appointment:   In Person  Provider:   Shelva Majestic, MD

## 2022-03-29 NOTE — Progress Notes (Signed)
Subjective:    Patient ID: Lee [name], male    DOB: 12/30/1950, 71 y.o.   MRN: [id number]  HPI  Wt Readings from Last 3 Encounters:  03/29/22 217 lb (98.4 kg)  03/03/22 210 lb (95.3 kg)  02/12/22 210 lb (95.3 kg)   Patient is here today for follow-up.  When I saw the patient for his physical back in July, he was 219 pounds.  His weight is staying stable at 217 pounds today.  He is tolerating Trulicity 3 mg weekly.  He denies any nausea or vomiting.  When I last saw the patient he was having palpitations.  He states that he has not had any additional palpitations since I last saw him.  He denies any skipped beats or racing heartbeats.  He denies any shortness of breath.  He denies any orthopnea.  He denies any paroxysmal nocturnal dyspnea.  He is due for his flu shot today.  Diabetic foot screen was performed and was normal Past Medical History:  Diagnosis Date   Allergic rhinitis    chronic sinusitis   Arthritis    osetoarthritis   CAD (coronary artery disease)    a. 2009 PCI/DES to mLAD; b. 05/2010 s/p DES RCA and LAD.; c. 05/2013 Cath: LAD mild ISR, LCX nl, RCA 40p, patent stents.   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   Cataract    has had lasik surg   COPD (chronic obstructive pulmonary disease) ([place])    Depression    Diabetes mellitus type 2, controlled, without complications ([place])    Diabetes mellitus without complication ([place])    Phreesia 08/22/2020   Diverticulosis    DJD (degenerative joint disease)    Esophagitis 1991   grade 1   GERD (gastroesophageal reflux disease)    Hiatal hernia   Hearing loss    wears bilateral hearing aids   Heart murmur    Dr Dennard Schaumann dx per patient   Hemorrhoids    Hyperlipidemia    Hypertension    Hypertensive heart disease    Iron deficiency anemia    Migraines    Myocardial infarction [facility])    Paroxysmal atrial fibrillation ([place])    a. s/p afib ablation 10/22/08 (J. Allred).   PVC's (premature  ventricular contractions)    Sleep apnea    Uses nightly.  Questionalble: RDI during the total sleep time 6h 28 mins was 3.55/hr during REM sleep was at 5.71/hr. Supine AHI was 5.93/hr.   Syncope 08/2018   Past Surgical History:  Procedure Laterality Date   ABDOMINAL HYSTERECTOMY     CARDIAC CATHETERIZATION  05/31/2010   The proximal LAD was then predilated with 2.0 x 12 trek. this was then stented with a 2.5 x 16 promus Element drug-eluting stent at 14 atmosphere and prostdilated with 2.75 x 12 [place] trek at 16 atmospheres(2.8 mm) resulting the reduction of 80% proximal LAD stenosis to 0% residual with excellent flow.   CARDIAC CATHETERIZATION  06/05/2010   Successful percutaneous coronary intervention to the right coronary artery with percutaneous transluminal coronary angioplasty/stenting and insertion 3.0 x 16 mm Promus DES post dilated to 3.35 mm with stenosis being reduced to 0%   CARDIAC CATHETERIZATION  02/29/2008   Post dilatation was performed using a 2.75 x 9 [place] sprinter, 10 atmospheres for 40 seconds and then 9 atmosphere for 35 sec. This resulted in the 80% area of narrowing pre-intervention, now appearing to normal. There was no edvidence of the  dissection or thrombus and there was TIMI III flow pre and post.   CARDIAC CATHETERIZATION  02/28/2006   CORONARY ANGIOPLASTY WITH STENT PLACEMENT     3 stents/ 4 caths   DISTAL BICEPS TENDON REPAIR Left 02/12/2022   Procedure: LEFT DISTAL BICEPS TENDON REPAIR;  Surgeon: Leandrew Koyanagi, MD;  Location: [place];  Service: Orthopedics;  Laterality: Left;   DISTAL BICEPS TENDON REPAIR Left 03/03/2022   Procedure: LEFT DISTAL BICEPS TENDON REPAIR;  Surgeon: Leandrew Koyanagi, MD;  Location: [place];  Service: Orthopedics;  Laterality: Left;   EYE SURGERY     FINGER ARTHROPLASTY Right 09/02/2021   Procedure: RIGHT THUMB CARPOMETACARPAL ARTHROPLASTY;  Surgeon: Leandrew Koyanagi, MD;  Location: [place];  Service: Orthopedics;  Laterality: Right;   block in preop   KNEE ARTHROSCOPY     right   LASIK     LEFT HEART CATHETERIZATION WITH CORONARY ANGIOGRAM N/A 06/29/2013   Procedure: LEFT HEART CATHETERIZATION WITH CORONARY ANGIOGRAM;  Surgeon: Leonie Man, MD;  Location: [facility] CATH LAB;  Service: Cardiovascular;  Laterality: N/A;   NASAL SINUS SURGERY  06/01/1999   NECK SURGERY     Cervical fusion   PACEMAKER IMPLANT N/A 05/01/2019   Procedure: PACEMAKER IMPLANT;  Surgeon: [name], MD;  Location: [place] CV LAB;  Service: Cardiovascular;  Laterality: N/A;   RADIOFREQUENCY ABLATION  09/28/2008   atrial fibrillation   ROTATOR CUFF REPAIR     left   SHOULDER OPEN ROTATOR CUFF REPAIR     right   SPINE SURGERY     WRIST ARTHROCENTESIS     left   Current Outpatient Medications on File Prior to Visit  Medication Sig Dispense Refill   ACCU-CHEK GUIDE test strip USE TO CHECK BLOOD SUGAR LEVELS TWICE DAILY. 100 strip 1   acetaminophen (TYLENOL) 325 MG tablet Take 1-2 tablets (325-650 mg total) by mouth every 4 (four) hours as needed for mild pain.     amLODipine (NORVASC) 10 MG tablet TAKE 1 TABLET(10 MG) BY MOUTH DAILY 90 tablet 1   aspirin EC 81 MG tablet Take 81 mg by mouth at bedtime.     Blood Glucose Monitoring Suppl (ACCU-CHEK AVIVA PLUS) w/Device KIT Check BS bid E11.9 1 kit 1   cholecalciferol (VITAMIN D) 1000 UNITS tablet Take 1,000 Units by mouth at bedtime.      citalopram (CELEXA) 40 MG tablet TAKE 1 TABLET(40 MG) BY MOUTH DAILY 90 tablet 1   Coenzyme Q10 (CO Q 10) 100 MG CAPS Take 100 mg by mouth daily with breakfast.      fexofenadine (ALLEGRA) 180 MG tablet Take 180 mg by mouth daily with breakfast.      fluticasone (FLONASE) 50 MCG/ACT nasal spray INSTILL 2 SPRAYS IN THE  NOSE AT BEDTIME 48 g 3   furosemide (LASIX) 40 MG tablet TAKE 1 TABLET BY MOUTH  DAILY 90 tablet 3   Galcanezumab-gnlm (EMGALITY) 120 MG/ML SOAJ Inject 120 mg into the skin every 30 (thirty) days. 1.12 mL 11   HYDROcodone-acetaminophen  (NORCO) 7.5-325 MG tablet Take 1-2 tablets by mouth 3 (three) times daily as needed for moderate pain. 14 tablet 0   JARDIANCE 25 MG TABS tablet TAKE 1 TABLET BY MOUTH DAILY BEFORE BREAKFAST 90 tablet 1   Lancets Misc. (ACCU-CHEK FASTCLIX LANCET) KIT USE AS DIRECTED TO CHECK BLOOD SUGAR TWICE DAILY 1 kit 1   Multiple Vitamin (MULTIVITAMIN WITH MINERALS) TABS tablet Take 1 tablet by mouth daily with  breakfast.     nitroGLYCERIN (NITROSTAT) 0.4 MG SL tablet PLACE ONE TABLET UNDER THE TONGUE EVERY 5 MINUTES AS NEEDED FOR CHEST PAIN 25 tablet 3   oxyCODONE-acetaminophen (PERCOCET) 10-325 MG tablet Take 1 tablet by mouth every 8 (eight) hours as needed for pain. 21 tablet 0   oxyCODONE-acetaminophen (PERCOCET) 5-325 MG tablet Take 1-2 tablets by mouth 2 (two) times daily as needed for severe pain. 20 tablet 0   pantoprazole (PROTONIX) 40 MG tablet TAKE 1 TABLET BY MOUTH  DAILY 90 tablet 3   Polyvinyl Alcohol-Povidone (REFRESH OP) Place 1 drop into both eyes at bedtime as needed (dry eyes).     potassium chloride (MICRO-K) 10 MEQ CR capsule TAKE 1 CAPSULE BY MOUTH  DAILY 90 capsule 3   Rimegepant Sulfate 75 MG TBDP Take 75 mg by mouth daily as needed (Migraine).     rosuvastatin (CRESTOR) 40 MG tablet TAKE 1 TABLET BY MOUTH  DAILY 90 tablet 3   telmisartan (MICARDIS) 40 MG tablet TAKE 1 TABLET BY MOUTH  DAILY 90 tablet 3   TRULICITY 3 IF/0.2DX SOPN ADMINISTER 3MG UNDER THE SKIN 1 TIME A WEEK AS DIRECTED (Patient taking differently: Takes on Friday) 2 mL 2   No current facility-administered medications on file prior to visit.   Allergies  Allergen Reactions   Ibuprofen Swelling    Whole body swelled   Other Shortness Of Breath    BETA BLOCKERS   Topamax [Topiramate] Other (See Comments)    Pt states it made his tongue feel "scalded" and he couldn't eat    Toprol Xl [Metoprolol Succinate] Other (See Comments)    Personality change   Metoprolol-Hctz Er Other (See Comments)    Indigestion,mouth raw    Social History   Socioeconomic History   Marital status: Married    Spouse name: Katharine Look   Number of children: 2   Years of education: Not on file   Highest education level: Not on file  Occupational History   Occupation: retired  Tobacco Use   Smoking status: Former    Packs/day: 2.00    Years: 25.00    Total pack years: 50.00    Types: Cigarettes, Cigars    Quit date: 04/22/1988    Years since quitting: 33.9   Smokeless tobacco: Never  Vaping Use   Vaping Use: Never used  Substance and Sexual Activity   Alcohol use: Not Currently   Drug use: No   Sexual activity: Not Currently    Birth control/protection: Surgical    Comment: vas  Other Topics Concern   Not on file  Social History Narrative   Retired Social research officer, 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   Currently works part time for [employer].   2 daughters.    Married x 43 years 09/2021.   Caffiene:  1 cup daily, 2 diet drinks daily. (Caffiene free most time).    Social Determinants of Health   Financial Resource Strain: Low Risk  (08/27/2021)   Overall Financial Resource Strain (CARDIA)    Difficulty of Paying Living Expenses: Not hard at all  Food Insecurity: No Food Insecurity (08/27/2021)   Hunger Vital Sign    Worried About Running Out of Food in the Last Year: Never true    Ran Out of Food in the Last Year: Never true  Transportation Needs: No Transportation Needs (08/27/2021)   PRAPARE - Hydrologist (Medical): No    Lack of Transportation (Non-Medical): No  Physical Activity: Sufficiently Active (08/27/2021)   Exercise Vital Sign    Days of Exercise per Week: 4 days    Minutes of Exercise per Session: 60 min  Stress: No Stress Concern Present (08/27/2021)   [place]    Feeling of Stress : Not at all  Social Connections: [place] (08/27/2021)   Social Connection and Isolation Panel [NHANES]     Frequency of Communication with Friends and Family: More than three times a week    Frequency of Social Gatherings with Friends and Family: More than three times a week    Attends Religious Services: More than 4 times per year    Active Member of [employer] or Organizations: Yes    Attends Archivist Meetings: More than 4 times per year    Marital Status: Married  Human resources officer Violence: Not At Risk (08/27/2021)   Humiliation, Afraid, Rape, and Kick questionnaire    Fear of Current or Ex-Partner: No    Emotionally Abused: No    Physically Abused: No    Sexually Abused: No      Review of Systems  All other systems reviewed and are negative.      Objective:   Physical Exam Vitals reviewed.  Constitutional:      General: He is not in acute distress.    Appearance: He is not ill-appearing, toxic-appearing or diaphoretic.  Cardiovascular:     Rate and Rhythm: Normal rate.     Heart sounds: Murmur heard.  Pulmonary:     Effort: Pulmonary effort is normal. No tachypnea or respiratory distress.     Breath sounds: No stridor.  Musculoskeletal:     Right lower leg: No edema.     Left lower leg: No edema.  Neurological:     Mental Status: He is alert.           Assessment & Plan:  Type 2 diabetes mellitus with complication, without long-term current use of insulin (HCC) - Plan: CBC with Differential/Platelet, COMPLETE METABOLIC PANEL WITH GFR, Lipid panel, Protein / Creatinine Ratio, Urine  Coronary artery disease involving native coronary artery of native heart without angina pectoris  Essential hypertension  CAD S/P percutaneous coronary angioplasty  Paroxysmal atrial fibrillation (HCC) Patient's blood pressure today is outstanding at 120/62.  His last A1c was just barely above 6 which was outstanding.  I will repeat his A1c today.  I will also check a lipid panel as well as a urine protein creatinine ratio.  Goal protein creatinine ratio is less than 100.  Goal  A1c is less than 6.5.  Given his history of coronary artery disease, his goal LDL cholesterol is less than 70.  The patient received his flu shot today.  Diabetic foot exam was performed and was normal.  We discussed switching Trulicity to [facility] to achieve additional weight loss.  Patient's BMI is still elevated at 33.  I would like him to switch to Mounjaro 7.5 mg subcu weekly and then uptitrate on a monthly basis to 15 mg subcu weekly if tolerated and discontinue Trulicity

## 2022-03-29 NOTE — Addendum Note (Signed)
Addended by: Randal Buba K on: 03/29/2022 12:24 PM   Modules accepted: Orders

## 2022-03-30 LAB — CBC WITH DIFFERENTIAL/PLATELET
Absolute Monocytes: 455 cells/uL (ref 200–950)
Basophils Absolute: 59 cells/uL (ref 0–200)
Basophils Relative: 0.9 %
Eosinophils Absolute: 139 cells/uL (ref 15–500)
Eosinophils Relative: 2.1 %
HCT: 46.2 % (ref 38.5–50.0)
Hemoglobin: 15.7 g/dL (ref 13.2–17.1)
Lymphs Abs: 1643 cells/uL (ref 850–3900)
MCH: 30.2 pg (ref 27.0–33.0)
MCHC: 34 g/dL (ref 32.0–36.0)
MCV: 88.8 fL (ref 80.0–100.0)
MPV: 8.9 fL (ref 7.5–12.5)
Monocytes Relative: 6.9 %
Neutro Abs: 4303 cells/uL (ref 1500–7800)
Neutrophils Relative %: 65.2 %
Platelets: 257 10*3/uL (ref 140–400)
RBC: 5.2 10*6/uL (ref 4.20–5.80)
RDW: 11.8 % (ref 11.0–15.0)
Total Lymphocyte: 24.9 %
WBC: 6.6 10*3/uL (ref 3.8–10.8)

## 2022-03-30 LAB — LIPID PANEL
Cholesterol: 121 mg/dL (ref <200)
HDL: 56 mg/dL (ref >=40)
LDL Cholesterol (Calc): 47 mg/dL (calc)
Non-HDL Cholesterol (Calc): 65 mg/dL (calc) (ref <130)
Total CHOL/HDL Ratio: 2.2 (calc) (ref <5.0)
Triglycerides: 101 mg/dL (ref <150)

## 2022-03-30 LAB — PROTEIN / CREATININE RATIO, URINE
Creatinine, Urine: 21 mg/dL (ref 20–320)
Total Protein, Urine: <4 mg/dL — ABNORMAL LOW (ref 5–25)

## 2022-04-06 ENCOUNTER — Other Ambulatory Visit: Payer: Self-pay | Admitting: Family Medicine

## 2022-04-13 ENCOUNTER — Ambulatory Visit (INDEPENDENT_AMBULATORY_CARE_PROVIDER_SITE_OTHER): Payer: Medicare Other | Admitting: Podiatry

## 2022-04-13 ENCOUNTER — Encounter: Payer: Self-pay | Admitting: Podiatry

## 2022-04-13 DIAGNOSIS — D2371 Other benign neoplasm of skin of right lower limb, including hip: Secondary | ICD-10-CM | POA: Diagnosis not present

## 2022-04-13 DIAGNOSIS — D2372 Other benign neoplasm of skin of left lower limb, including hip: Secondary | ICD-10-CM | POA: Diagnosis not present

## 2022-04-13 DIAGNOSIS — M7751 Other enthesopathy of right foot: Secondary | ICD-10-CM

## 2022-04-13 DIAGNOSIS — M7752 Other enthesopathy of left foot: Secondary | ICD-10-CM

## 2022-04-13 MED ORDER — DEXAMETHASONE SODIUM PHOSPHATE 120 MG/30ML IJ SOLN
4.0000 mg | Freq: Once | INTRAMUSCULAR | Status: AC
  Administered 2022-04-13: 4 mg via INTRA_ARTICULAR

## 2022-04-13 NOTE — Progress Notes (Signed)
He presents today chief complaint of painful calluses and bursitis to the plantar aspect of his fifth metatarsal bases bilaterally.  States they have been bothering for quite some time now.  Objective: Vital signs stable alert oriented x3 there is no erythema but he does have fluctuance on palpation of the fifth metatarsal bases bilaterally.  There is no cellulitis drainage or odor overlying reactive keratomas are present.  No open lesions or wounds are noted.  Assessment: Pain in limb with bursitis and benign skin lesion Sub fifth metatarsal base bilateral.  Plan: Discussed etiology pathology conservative versus surgical therapies I injected the bursa today with 2 mg of dexamethasone and local anesthetic tolerated procedure well.  I also debrided the benign skin lesions mechanically and chemically with Salinocaine under occlusion to be left on for 3 days then washed off thoroughly we will follow-up with him in the near future.

## 2022-04-14 ENCOUNTER — Ambulatory Visit (INDEPENDENT_AMBULATORY_CARE_PROVIDER_SITE_OTHER): Payer: Medicare Other | Admitting: Physician Assistant

## 2022-04-14 ENCOUNTER — Encounter: Payer: Self-pay | Admitting: Family Medicine

## 2022-04-14 ENCOUNTER — Encounter: Payer: Self-pay | Admitting: Orthopaedic Surgery

## 2022-04-14 DIAGNOSIS — S46212D Strain of muscle, fascia and tendon of other parts of biceps, left arm, subsequent encounter: Secondary | ICD-10-CM

## 2022-04-14 NOTE — Progress Notes (Signed)
Post-Op Visit Note   Patient: Lee [name]           Date of Birth: [birth year]           MRN: [id number] Visit Date: 04/14/2022 PCP: Susy Frizzle, MD   Assessment & Plan:  Chief Complaint:  Chief Complaint  Patient presents with   Left Elbow - Follow-up    Left distal biceps tendon repair 03/03/2022   Visit Diagnoses:  1. Rupture of left distal biceps tendon, subsequent encounter     Plan: Patient is a very pleasant 71 year old gentleman who comes in today 6 weeks status post left distal biceps revision repair 03/03/2022.  He has been doing well.  No pain.  He still notes decreased sensation in the volar forearm.  He has been working on range of motion exercises.  Examination of his left elbow reveals a fully healed surgical scar without complication.  Full range of motion of the elbow without pain.  Decreased sensation to the volar forearm.  He is neurovascular intact distally.  At this point, we would like for him to start working on strengthening exercises.  Referral to send well at [facility] has been made.  He will follow-up with [place] in 6 weeks for recheck.  Call with concerns or questions.  Follow-Up Instructions: Return in about 6 weeks (around 05/26/2022).   Orders:  No orders of the defined types were placed in this encounter.  No orders of the defined types were placed in this encounter.   Imaging: No new imaging  PMFS History: Patient Active Problem List   Diagnosis Date Noted   Rupture of left distal biceps tendon 02/09/2022   Pain in left elbow 01/28/2022   Chronic migraine without aura with status migrainosus, not intractable 01/27/2022   Primary osteoarthritis of first carpometacarpal joint of right hand 09/02/2021   Pain in right knee 02/12/2020   Second degree Mobitz II AV block 04/30/2019   Syncope and collapse 10/10/2018   Heart block AV complete ([place]) 10/10/2018   Symptomatic bradycardia 10/10/2018   Syncope, vasovagal  09/20/2018   Neck mass 10/14/2016   Depression 05/20/2016   Hypertensive heart disease    Paroxysmal atrial fibrillation ([place])    Hyperlipidemia LDL goal <70 03/27/2014   Premature ventricular contraction 03/04/2014   Chest pain with high risk of acute coronary syndrome 06/28/2013   Morbid obesity ([place]) 06/28/2013   OSA (obstructive sleep apnea) 12/18/2012   Sleepiness 11/06/2012   Fatigue 11/06/2012   PALPITATIONS 06/02/2009   DYSLIPIDEMIA 09/05/2008   Essential hypertension 09/05/2008   CAD S/P percutaneous coronary angioplasty 09/05/2008   RBBB 09/05/2008   SUPRAVENTRICULAR TACHYCARDIA 09/05/2008   ALLERGIC RHINITIS 09/05/2008   GERD 09/05/2008   BACK PAIN 09/05/2008   ARRHYTHMIA, HX OF 09/05/2008   MIGRAINES, HX OF 09/05/2008   Dyslipidemia 09/05/2008   Past Medical History:  Diagnosis Date   Allergic rhinitis    chronic sinusitis   Arthritis    osetoarthritis   CAD (coronary artery disease)    a. 2009 PCI/DES to mLAD; b. 05/2010 s/p DES RCA and LAD.; c. 05/2013 Cath: LAD mild ISR, LCX nl, RCA 40p, patent stents.   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   Cataract    has had lasik surg   COPD (chronic obstructive pulmonary disease) ([place])    Depression    Diabetes mellitus type 2, controlled, without complications ([place])    Diabetes mellitus without complication (  [place])    Phreesia 08/22/2020   Diverticulosis    DJD (degenerative joint disease)    Esophagitis 1991   grade 1   GERD (gastroesophageal reflux disease)    Hiatal hernia   Hearing loss    wears bilateral hearing aids   Heart murmur    Dr Dennard Schaumann dx per patient   Hemorrhoids    Hyperlipidemia    Hypertension    Hypertensive heart disease    Iron deficiency anemia    Migraines    Myocardial infarction [facility])    Paroxysmal atrial fibrillation ([place])    a. s/p afib ablation 10/22/08 (J. Allred).   PVC's (premature ventricular contractions)    Sleep apnea    Uses nightly.   Questionalble: RDI during the total sleep time 6h 28 mins was 3.55/hr during REM sleep was at 5.71/hr. Supine AHI was 5.93/hr.   Syncope 08/2018    Family History  Problem Relation Age of Onset   Colon cancer Mother        mets from uterine   Uterine cancer Mother    Heart disease Father        and sister   Diabetes Father    Hearing loss Father    Stroke Father    Heart disease Sister    Lung cancer Sister    Stroke Brother     Past Surgical History:  Procedure Laterality Date   ABDOMINAL HYSTERECTOMY     CARDIAC CATHETERIZATION  05/31/2010   The proximal LAD was then predilated with 2.0 x 12 trek. this was then stented with a 2.5 x 16 promus Element drug-eluting stent at 14 atmosphere and prostdilated with 2.75 x 12 [place] trek at 16 atmospheres(2.8 mm) resulting the reduction of 80% proximal LAD stenosis to 0% residual with excellent flow.   CARDIAC CATHETERIZATION  06/05/2010   Successful percutaneous coronary intervention to the right coronary artery with percutaneous transluminal coronary angioplasty/stenting and insertion 3.0 x 16 mm Promus DES post dilated to 3.35 mm with stenosis being reduced to 0%   CARDIAC CATHETERIZATION  02/29/2008   Post dilatation was performed using a 2.75 x 9 [place] sprinter, 10 atmospheres for 40 seconds and then 9 atmosphere for 35 sec. This resulted in the 80% area of narrowing pre-intervention, now appearing to normal. There was no edvidence of the dissection or thrombus and there was TIMI III flow pre and post.   CARDIAC CATHETERIZATION  02/28/2006   CORONARY ANGIOPLASTY WITH STENT PLACEMENT     3 stents/ 4 caths   DISTAL BICEPS TENDON REPAIR Left 02/12/2022   Procedure: LEFT DISTAL BICEPS TENDON REPAIR;  Surgeon: Leandrew Koyanagi, MD;  Location: [place];  Service: Orthopedics;  Laterality: Left;   DISTAL BICEPS TENDON REPAIR Left 03/03/2022   Procedure: LEFT DISTAL BICEPS TENDON REPAIR;  Surgeon: Leandrew Koyanagi, MD;  Location: [place];  Service: Orthopedics;   Laterality: Left;   EYE SURGERY     FINGER ARTHROPLASTY Right 09/02/2021   Procedure: RIGHT THUMB CARPOMETACARPAL ARTHROPLASTY;  Surgeon: Leandrew Koyanagi, MD;  Location: [place];  Service: Orthopedics;  Laterality: Right;  block in preop   KNEE ARTHROSCOPY     right   LASIK     LEFT HEART CATHETERIZATION WITH CORONARY ANGIOGRAM N/A 06/29/2013   Procedure: LEFT HEART CATHETERIZATION WITH CORONARY ANGIOGRAM;  Surgeon: Leonie Man, MD;  Location[facility] CATH LAB;  Service: Cardiovascular;  Laterality: N/A;   NASAL SINUS SURGERY  06/01/1999   NECK SURGERY  Cervical fusion   PACEMAKER IMPLANT N/A 05/01/2019   Procedure: PACEMAKER IMPLANT;  Surgeon: [name], MD;  Location: [place] CV LAB;  Service: Cardiovascular;  Laterality: N/A;   RADIOFREQUENCY ABLATION  09/28/2008   atrial fibrillation   ROTATOR CUFF REPAIR     left   SHOULDER OPEN ROTATOR CUFF REPAIR     right   SPINE SURGERY     WRIST ARTHROCENTESIS     left   Social History   Occupational History   Occupation: retired  Tobacco Use   Smoking status: Former    Packs/day: 2.00    Years: 25.00    Total pack years: 50.00    Types: Cigarettes, Cigars    Quit date: 04/22/1988    Years since quitting: 34.0   Smokeless tobacco: Never  Vaping Use   Vaping Use: Never used  Substance and Sexual Activity   Alcohol use: Not Currently   Drug use: No   Sexual activity: Not Currently    Birth control/protection: Surgical    Comment: vas

## 2022-04-15 ENCOUNTER — Other Ambulatory Visit: Payer: Self-pay | Admitting: Family Medicine

## 2022-04-15 MED ORDER — OXYCODONE-ACETAMINOPHEN 10-325 MG PO TABS: 1.0000 | ORAL_TABLET | Freq: Three times a day (TID) | ORAL | 0 refills | Status: DC | PRN

## 2022-04-20 ENCOUNTER — Encounter (HOSPITAL_BASED_OUTPATIENT_CLINIC_OR_DEPARTMENT_OTHER): Payer: Self-pay | Admitting: Physical Therapy

## 2022-04-20 ENCOUNTER — Ambulatory Visit: Payer: Medicare Other | Admitting: Podiatry

## 2022-04-20 ENCOUNTER — Ambulatory Visit (HOSPITAL_BASED_OUTPATIENT_CLINIC_OR_DEPARTMENT_OTHER): Payer: Medicare Other | Attending: Physician Assistant | Admitting: Physical Therapy

## 2022-04-20 ENCOUNTER — Other Ambulatory Visit: Payer: Self-pay

## 2022-04-20 DIAGNOSIS — S46212D Strain of muscle, fascia and tendon of other parts of biceps, left arm, subsequent encounter: Secondary | ICD-10-CM | POA: Diagnosis not present

## 2022-04-20 DIAGNOSIS — M6281 Muscle weakness (generalized): Secondary | ICD-10-CM | POA: Diagnosis present

## 2022-04-20 DIAGNOSIS — M25522 Pain in left elbow: Secondary | ICD-10-CM | POA: Diagnosis present

## 2022-04-20 NOTE — Therapy (Signed)
OUTPATIENT PHYSICAL THERAPY UPPER EXTREMITY EVALUATION   Patient Name: Lee [name] MRN: [id number] DOB:December 01, 1950, 71 y.o., male Today's Date: 04/20/2022  END OF SESSION:  PT End of Session - 04/20/22 1014     Visit Number 1    Number of Visits 8    Date for PT Re-Evaluation 06/15/22    PT Start Time 0850    PT Stop Time 0925    PT Time Calculation (min) 35 min    Activity Tolerance Patient tolerated treatment well    Behavior During Therapy [facility] for tasks assessed/performed             Past Medical History:  Diagnosis Date   Allergic rhinitis    chronic sinusitis   Arthritis    osetoarthritis   CAD (coronary artery disease)    a. 2009 PCI/DES to mLAD; b. 05/2010 s/p DES RCA and LAD.; c. 05/2013 Cath: LAD mild ISR, LCX nl, RCA 40p, patent stents.   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   Cataract    has had lasik surg   COPD (chronic obstructive pulmonary disease) ([place])    Depression    Diabetes mellitus type 2, controlled, without complications ([place])    Diabetes mellitus without complication ([place])    Phreesia 08/22/2020   Diverticulosis    DJD (degenerative joint disease)    Esophagitis 1991   grade 1   GERD (gastroesophageal reflux disease)    Hiatal hernia   Hearing loss    wears bilateral hearing aids   Heart murmur    Dr Dennard Schaumann dx per patient   Hemorrhoids    Hyperlipidemia    Hypertension    Hypertensive heart disease    Iron deficiency anemia    Migraines    Myocardial infarction [facility])    Paroxysmal atrial fibrillation ([place])    a. s/p afib ablation 10/22/08 ([facility]).   PVC's (premature ventricular contractions)    Sleep apnea    Uses nightly.  Questionalble: RDI during the total sleep time 6h 28 mins was 3.55/hr during REM sleep was at 5.71/hr. Supine AHI was 5.93/hr.   Syncope 08/2018   Past Surgical History:  Procedure Laterality Date   ABDOMINAL HYSTERECTOMY     CARDIAC CATHETERIZATION  05/31/2010   The  proximal LAD was then predilated with 2.0 x 12 trek. this was then stented with a 2.5 x 16 promus Element drug-eluting stent at 14 atmosphere and prostdilated with 2.75 x 12 [place] trek at 16 atmospheres(2.8 mm) resulting the reduction of 80% proximal LAD stenosis to 0% residual with excellent flow.   CARDIAC CATHETERIZATION  06/05/2010   Successful percutaneous coronary intervention to the right coronary artery with percutaneous transluminal coronary angioplasty/stenting and insertion 3.0 x 16 mm Promus DES post dilated to 3.35 mm with stenosis being reduced to 0%   CARDIAC CATHETERIZATION  02/29/2008   Post dilatation was performed using a 2.75 x 9 [place] sprinter, 10 atmospheres for 40 seconds and then 9 atmosphere for 35 sec. This resulted in the 80% area of narrowing pre-intervention, now appearing to normal. There was no edvidence of the dissection or thrombus and there was TIMI III flow pre and post.   CARDIAC CATHETERIZATION  02/28/2006   CORONARY ANGIOPLASTY WITH STENT PLACEMENT     3 stents/ 4 caths   DISTAL BICEPS TENDON REPAIR Left 02/12/2022   Procedure: LEFT DISTAL BICEPS TENDON REPAIR;  Surgeon: Leandrew Koyanagi, MD;  Location: [place];  Service:  Orthopedics;  Laterality: Left;   DISTAL BICEPS TENDON REPAIR Left 03/03/2022   Procedure: LEFT DISTAL BICEPS TENDON REPAIR;  Surgeon: Leandrew Koyanagi, MD;  Location: [place];  Service: Orthopedics;  Laterality: Left;   EYE SURGERY     FINGER ARTHROPLASTY Right 09/02/2021   Procedure: RIGHT THUMB CARPOMETACARPAL ARTHROPLASTY;  Surgeon: Leandrew Koyanagi, MD;  Location: [place];  Service: Orthopedics;  Laterality: Right;  block in preop   KNEE ARTHROSCOPY     right   LASIK     LEFT HEART CATHETERIZATION WITH CORONARY ANGIOGRAM N/A 06/29/2013   Procedure: LEFT HEART CATHETERIZATION WITH CORONARY ANGIOGRAM;  Surgeon: Leonie Man, MD;  Location: [facility] CATH LAB;  Service: Cardiovascular;  Laterality: N/A;   NASAL SINUS SURGERY  06/01/1999   NECK  SURGERY     Cervical fusion   PACEMAKER IMPLANT N/A 05/01/2019   Procedure: PACEMAKER IMPLANT;  Surgeon: [name], MD;  Location: [place] CV LAB;  Service: Cardiovascular;  Laterality: N/A;   RADIOFREQUENCY ABLATION  09/28/2008   atrial fibrillation   ROTATOR CUFF REPAIR     left   SHOULDER OPEN ROTATOR CUFF REPAIR     right   SPINE SURGERY     WRIST ARTHROCENTESIS     left   Patient Active Problem List   Diagnosis Date Noted   Rupture of left distal biceps tendon 02/09/2022   Pain in left elbow 01/28/2022   Chronic migraine without aura with status migrainosus, not intractable 01/27/2022   Primary osteoarthritis of first carpometacarpal joint of right hand 09/02/2021   Pain in right knee 02/12/2020   Second degree Mobitz II AV block 04/30/2019   Syncope and collapse 10/10/2018   Heart block AV complete ([place]) 10/10/2018   Symptomatic bradycardia 10/10/2018   Syncope, vasovagal 09/20/2018   Neck mass 10/14/2016   Depression 05/20/2016   Hypertensive heart disease    Paroxysmal atrial fibrillation ([place])    Hyperlipidemia LDL goal <70 03/27/2014   Premature ventricular contraction 03/04/2014   Chest pain with high risk of acute coronary syndrome 06/28/2013   Morbid obesity ([place]) 06/28/2013   OSA (obstructive sleep apnea) 12/18/2012   Sleepiness 11/06/2012   Fatigue 11/06/2012   PALPITATIONS 06/02/2009   DYSLIPIDEMIA 09/05/2008   Essential hypertension 09/05/2008   CAD S/P percutaneous coronary angioplasty 09/05/2008   RBBB 09/05/2008   SUPRAVENTRICULAR TACHYCARDIA 09/05/2008   ALLERGIC RHINITIS 09/05/2008   GERD 09/05/2008   BACK PAIN 09/05/2008   ARRHYTHMIA, HX OF 09/05/2008   MIGRAINES, HX OF 09/05/2008   Dyslipidemia 09/05/2008    PCP: Dr Jenna Luo   REFERRING PROVIDER: Tiney Rouge PA   REFERRING DIAG: Left Distal Biceps Repair ( 2nd repair)   THERAPY DIAG:  Pain in left elbow  Muscle weakness (generalized)  Rationale for Evaluation and  Treatment: Rehabilitation  ONSET DATE: 03/04/2022  SUBJECTIVE:  SUBJECTIVE STATEMENT:   PERTINENT HISTORY:   PAIN:  Are you having pain? Yes: NPRS scale: 0/10 0/10 at worst over the past week  Pain location: left elbow  Pain description: aching  Aggravating factors: nothing at this time  Relieving factors: hasn't had pain recently   PRECAUTIONS: None  WEIGHT BEARING RESTRICTIONS: No  FALLS:  Has patient fallen in last 6 months? No  LIVING ENVIRONMENT: Nothing significant  OCCUPATION: Works part time at [employer] of [employer]   Hobbies: wood working and walking   PLOF: Independent  PATIENT GOALS:  To have full function of his dominant arm   NEXT MD VISIT:   OBJECTIVE:   DIAGNOSTIC FINDINGS:  Nothing post op   PATIENT SURVEYS:  FOTO    COGNITION: Overall cognitive status: Within functional limits for tasks assessed     SENSATION: WFL  POSTURE: No significant limitations   UPPER EXTREMITY ROM:   Active ROM Right eval Left eval  Shoulder flexion    Shoulder extension    Shoulder abduction    Shoulder adduction    Shoulder internal rotation    Shoulder external rotation    Elbow flexion  Full   Elbow extension  Full   Wrist flexion    Wrist extension    Wrist ulnar deviation    Wrist radial deviation    Wrist pronation    Wrist supination    (Blank rows = not tested) UPPER EXTREMITY MMT:  MMT Right eval Left eval  Shoulder flexion 5/5 4/5  Shoulder extension    Shoulder abduction    Shoulder adduction    Shoulder internal rotation    Shoulder external rotation    Middle trapezius    Lower trapezius    Elbow flexion  Not taken 2nd to recent surgery   Elbow extension    Wrist flexion    Wrist extension    Wrist ulnar deviation    Wrist radial deviation     Wrist pronation    Wrist supination    Grip strength (lbs)    (Blank rows = not tested)  PALPATION:  No unexpected tenderness to palpation     TODAY'S TREATMENT:                                                                                                                                         DATE:  Exercises - Seated Forearm Pronation and Supination with Hammer  - 1 x daily - 7 x weekly - 3 sets - 10 reps - Seated Elbow Flexion and Extension AROM  - 1 x daily - 7 x weekly - 3 sets - 10 reps - Shoulder extension with resistance - Neutral  - 1 x daily - 7 x weekly - 3 sets - 10 reps - Scapular Retraction with Resistance  - 1 x daily - 7 x weekly - 3 sets - 10 reps -  Standing Shoulder Flexion to 90 Degrees with Dumbbells  - 1 x daily - 7 x weekly - 3 sets - 10 reps   PATIENT EDUCATION: Education details: HEP; symptom management; safe progression of activity  Person educated: Patient Education method: Explanation, Demonstration, Tactile cues, Verbal cues, and Handouts Education comprehension: verbalized understanding, returned demonstration, verbal cues required, tactile cues required, and needs further education  HOME EXERCISE PROGRAM: Access Code: [email or web address] URL: [email or web address] Date: 04/20/2022 Prepared by: Carolyne Littles  Exercises - Seated Forearm Pronation and Supination with Hammer  - 1 x daily - 7 x weekly - 3 sets - 10 reps - Seated Elbow Flexion and Extension AROM  - 1 x daily - 7 x weekly - 3 sets - 10 reps - Shoulder extension with resistance - Neutral  - 1 x daily - 7 x weekly - 3 sets - 10 reps - Scapular Retraction with Resistance  - 1 x daily - 7 x weekly - 3 sets - 10 reps - Standing Shoulder Flexion to 90 Degrees with Dumbbells  - 1 x daily - 7 x weekly - 3 sets - 10 reps  ASSESSMENT:  CLINICAL IMPRESSION: Patient is a 71 year old male status post distal biceps repair on 10/ 5.  This was a revision following failed repair that was  performed on 9/25.  At this point he is doing excellent.  He presents with full range of motion.  He has not had pain in his arm in over a week.  He comes in today for a strengthening program.  He had no unexpected tenderness to palpation in the surgical repair site.  We gave him a weight strengthening program.  He hopes to return to the gym at some point.  We will continue to progress strengthening exercises per distal biceps repair protocol.   OBJECTIVE IMPAIRMENTS: decreased ROM, decreased strength, impaired UE functional use, and pain.   ACTIVITY LIMITATIONS: carrying, lifting, self feeding, and reach over head  PARTICIPATION LIMITATIONS: meal prep, cleaning, laundry, community activity, occupation, and yard work  PERSONAL FACTORS: 3+ comorbidities: CAD; DMII, OA of the right hand;  are also affecting patient's functional outcome.   REHAB POTENTIAL: Excellent  CLINICAL DECISION MAKING: Stable/uncomplicated  EVALUATION COMPLEXITY: Low  GOALS: Goals reviewed with patient? Yes  SHORT TERM GOALS: Target date: 12/12  Patient will increase shoulder flexion strength to 4+ out of 5 Baseline: Goal status: INITIAL  2.  Patient will have basic exercise strength and strengthening program Baseline:  Goal status: INITIAL  3.  Patient will reach overhead with 1 pound weight without an increased pain Baseline:  Goal status: INITIAL   LONG TERM GOALS: Target date:   Patient will return to woodworking without pain Baseline:  Goal status: INITIAL  2.  Patient will return to light gym program without pain Baseline:  Goal status: INITIAL  3.  Patient will have full exercise program to promote shoulder and bicep strength Baseline:  Goal status: INITIAL  PLAN: PT FREQUENCY: 1x/week  PT DURATION: 8 weeks  PLANNED INTERVENTIONS: Therapeutic exercises, Therapeutic activity, Neuromuscular re-education, Patient/Family education, Self Care, Joint mobilization, Cryotherapy, Moist heat,  Taping, and Manual therapy  PLAN FOR NEXT SESSION: Review tolerance to HEP, advance weights with HEP, and standing shoulder scaption, patient will likely only need 1-2 visits to progress HEP that may be of visit further along when he is ready for return to gym exercises.   Carney Living, PT 04/20/2022, 1:14 PM

## 2022-04-21 ENCOUNTER — Encounter (HOSPITAL_COMMUNITY): Payer: Self-pay

## 2022-04-21 ENCOUNTER — Ambulatory Visit (INDEPENDENT_AMBULATORY_CARE_PROVIDER_SITE_OTHER): Payer: Medicare Other

## 2022-04-21 ENCOUNTER — Ambulatory Visit: Payer: Medicare Other | Admitting: Neurology

## 2022-04-21 ENCOUNTER — Ambulatory Visit (HOSPITAL_COMMUNITY)
Admission: RE | Admit: 2022-04-21 | Discharge: 2022-04-21 | Disposition: A | Discharge status: Home / Self Care | Payer: Medicare Other | Source: Ambulatory Visit | Attending: Physician Assistant | Admitting: Physician Assistant

## 2022-04-21 VITALS — BP 125/84 | HR 89 | Temp 98.1°F | Resp 18

## 2022-04-21 DIAGNOSIS — R059 Cough, unspecified: Secondary | ICD-10-CM | POA: Diagnosis not present

## 2022-04-21 DIAGNOSIS — J441 Chronic obstructive pulmonary disease with (acute) exacerbation: Secondary | ICD-10-CM

## 2022-04-21 MED ORDER — ALBUTEROL SULFATE HFA 108 (90 BASE) MCG/ACT IN AERS
INHALATION_SPRAY | RESPIRATORY_TRACT | Status: AC
  Filled 2022-04-21: qty 6.7

## 2022-04-21 MED ORDER — CEFDINIR 300 MG PO CAPS: 300.0000 mg | ORAL_CAPSULE | Freq: Two times a day (BID) | ORAL | 0 refills | Status: DC

## 2022-04-21 MED ORDER — BENZONATATE 100 MG PO CAPS: 100.0000 mg | ORAL_CAPSULE | Freq: Three times a day (TID) | ORAL | 0 refills | Status: DC

## 2022-04-21 MED ORDER — ALBUTEROL SULFATE HFA 108 (90 BASE) MCG/ACT IN AERS
2.0000 | INHALATION_SPRAY | Freq: Once | RESPIRATORY_TRACT | Status: AC
  Administered 2022-04-21: 2 via RESPIRATORY_TRACT

## 2022-04-21 NOTE — ED Provider Notes (Signed)
[place]    CSN: [id number] Arrival date & time: 04/21/22  0940      History   Chief Complaint Chief Complaint  Patient presents with   Cough    Coughing up green phlegm. - Entered by patient    HPI Lee [name] is a 71 y.o. male.   Patient presents today with a 1.5-week history of URI symptoms including sinus pressure, congestion, cough.  Denies any fever, chest pain, shortness of breath, nausea, vomiting.  He has tried multiple over-the-counter medications including Coricidin, Mucinex, NyQuil without improvement of symptoms.  He does have a history of allergies and uses Allegra daily.  He reports a history of COPD but does not have an albuterol inhaler available at this time.  He is a former smoker who quit many years ago.  Denies history of asthma.  Denies any recent antibiotic or steroid use though he has had several surgeries recently and is unsure if antibiotics were used at this time.  He has a history of diabetes but reports his blood sugars are well-controlled.  He is up-to-date on his immunizations including COVID-19, influenza.  Does report that his daughter was sick with "sinus infection" but denies additional sick contacts.  He is having difficulty sleeping at night as a result of symptoms.    Past Medical History:  Diagnosis Date   Allergic rhinitis    chronic sinusitis   Arthritis    osetoarthritis   CAD (coronary artery disease)    a. 2009 PCI/DES to mLAD; b. 05/2010 s/p DES RCA and LAD.; c. 05/2013 Cath: LAD mild ISR, LCX nl, RCA 40p, patent stents.   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   Cataract    has had lasik surg   COPD (chronic obstructive pulmonary disease) ([place])    Depression    Diabetes mellitus type 2, controlled, without complications ([place])    Diabetes mellitus without complication ([place])    Phreesia 08/22/2020   Diverticulosis    DJD (degenerative joint disease)    Esophagitis 1991   grade 1   GERD  (gastroesophageal reflux disease)    Hiatal hernia   Hearing loss    wears bilateral hearing aids   Heart murmur    Dr Dennard Schaumann dx per patient   Hemorrhoids    Hyperlipidemia    Hypertension    Hypertensive heart disease    Iron deficiency anemia    Migraines    Myocardial infarction [facility])    Paroxysmal atrial fibrillation ([place])    a. s/p afib ablation 10/22/08 ([facility]).   PVC's (premature ventricular contractions)    Sleep apnea    Uses nightly.  Questionalble: RDI during the total sleep time 6h 28 mins was 3.55/hr during REM sleep was at 5.71/hr. Supine AHI was 5.93/hr.   Syncope 08/2018    Patient Active Problem List   Diagnosis Date Noted   Rupture of left distal biceps tendon 02/09/2022   Pain in left elbow 01/28/2022   Chronic migraine without aura with status migrainosus, not intractable 01/27/2022   Primary osteoarthritis of first carpometacarpal joint of right hand 09/02/2021   Pain in right knee 02/12/2020   Second degree Mobitz II AV block 04/30/2019   Syncope and collapse 10/10/2018   Heart block AV complete ([place]) 10/10/2018   Symptomatic bradycardia 10/10/2018   Syncope, vasovagal 09/20/2018   Neck mass 10/14/2016   Depression 05/20/2016   Hypertensive heart disease    Paroxysmal atrial  fibrillation ([place])    Hyperlipidemia LDL goal <70 03/27/2014   Premature ventricular contraction 03/04/2014   Chest pain with high risk of acute coronary syndrome 06/28/2013   Morbid obesity ([place]) 06/28/2013   OSA (obstructive sleep apnea) 12/18/2012   Sleepiness 11/06/2012   Fatigue 11/06/2012   PALPITATIONS 06/02/2009   DYSLIPIDEMIA 09/05/2008   Essential hypertension 09/05/2008   CAD S/P percutaneous coronary angioplasty 09/05/2008   RBBB 09/05/2008   SUPRAVENTRICULAR TACHYCARDIA 09/05/2008   ALLERGIC RHINITIS 09/05/2008   GERD 09/05/2008   BACK PAIN 09/05/2008   ARRHYTHMIA, HX OF 09/05/2008   MIGRAINES, HX OF 09/05/2008   Dyslipidemia 09/05/2008    Past  Surgical History:  Procedure Laterality Date   ABDOMINAL HYSTERECTOMY     CARDIAC CATHETERIZATION  05/31/2010   The proximal LAD was then predilated with 2.0 x 12 trek. this was then stented with a 2.5 x 16 promus Element drug-eluting stent at 14 atmosphere and prostdilated with 2.75 x 12 [place] trek at 16 atmospheres(2.8 mm) resulting the reduction of 80% proximal LAD stenosis to 0% residual with excellent flow.   CARDIAC CATHETERIZATION  06/05/2010   Successful percutaneous coronary intervention to the right coronary artery with percutaneous transluminal coronary angioplasty/stenting and insertion 3.0 x 16 mm Promus DES post dilated to 3.35 mm with stenosis being reduced to 0%   CARDIAC CATHETERIZATION  02/29/2008   Post dilatation was performed using a 2.75 x 9 Hatch sprinter, 10 atmospheres for 40 seconds and then 9 atmosphere for 35 sec. This resulted in the 80% area of narrowing pre-intervention, now appearing to normal. There was no edvidence of the dissection or thrombus and there was TIMI III flow pre and post.   CARDIAC CATHETERIZATION  02/28/2006   CORONARY ANGIOPLASTY WITH STENT PLACEMENT     3 stents/ 4 caths   DISTAL BICEPS TENDON REPAIR Left 02/12/2022   Procedure: LEFT DISTAL BICEPS TENDON REPAIR;  Surgeon: Leandrew Koyanagi, MD;  Location: [place];  Service: Orthopedics;  Laterality: Left;   DISTAL BICEPS TENDON REPAIR Left 03/03/2022   Procedure: LEFT DISTAL BICEPS TENDON REPAIR;  Surgeon: Leandrew Koyanagi, MD;  Location: [place];  Service: Orthopedics;  Laterality: Left;   EYE SURGERY     FINGER ARTHROPLASTY Right 09/02/2021   Procedure: RIGHT THUMB CARPOMETACARPAL ARTHROPLASTY;  Surgeon: Leandrew Koyanagi, MD;  Location: [place];  Service: Orthopedics;  Laterality: Right;  block in preop   KNEE ARTHROSCOPY     right   LASIK     LEFT HEART CATHETERIZATION WITH CORONARY ANGIOGRAM N/A 06/29/2013   Procedure: LEFT HEART CATHETERIZATION WITH CORONARY ANGIOGRAM;  Surgeon: Leonie Man, MD;  Location: [facility] CATH LAB;  Service: Cardiovascular;  Laterality: N/A;   NASAL SINUS SURGERY  06/01/1999   NECK SURGERY     Cervical fusion   PACEMAKER IMPLANT N/A 05/01/2019   Procedure: PACEMAKER IMPLANT;  Surgeon: [name], MD;  Location: [place] CV LAB;  Service: Cardiovascular;  Laterality: N/A;   RADIOFREQUENCY ABLATION  09/28/2008   atrial fibrillation   ROTATOR CUFF REPAIR     left   SHOULDER OPEN ROTATOR CUFF REPAIR     right   SPINE SURGERY     WRIST ARTHROCENTESIS     left       Home Medications    Prior to Admission medications   Medication Sig Start Date End Date Taking? Authorizing Provider  benzonatate (TESSALON) 100 MG capsule Take 1 capsule (100 mg total) by mouth every  8 (eight) hours. 04/21/22  Yes Damarius Karnes K, PA-C  cefdinir (OMNICEF) 300 MG capsule Take 1 capsule (300 mg total) by mouth 2 (two) times daily. 04/21/22  Yes Migel Hannis K, PA-C  ACCU-CHEK GUIDE test strip USE TO CHECK BLOOD SUGAR LEVELS TWICE DAILY. 01/04/22   Susy Frizzle, MD  acetaminophen (TYLENOL) 325 MG tablet Take 1-2 tablets (325-650 mg total) by mouth every 4 (four) hours as needed for mild pain. 05/02/19   Shirley Friar, PA-C  amLODipine (NORVASC) 10 MG tablet TAKE 1 TABLET(10 MG) BY MOUTH DAILY 03/08/22   Susy Frizzle, MD  aspirin EC 81 MG tablet Take 81 mg by mouth at bedtime.    [provider]  Blood Glucose Monitoring Suppl (ACCU-CHEK AVIVA PLUS) w/Device KIT Check BS bid E11.9 05/10/19   Susy Frizzle, MD  cholecalciferol (VITAMIN D) 1000 UNITS tablet Take 1,000 Units by mouth at bedtime.     [provider]  citalopram (CELEXA) 40 MG tablet TAKE 1 TABLET(40 MG) BY MOUTH DAILY 03/08/22   Susy Frizzle, MD  Coenzyme Q10 (CO Q 10) 100 MG CAPS Take 100 mg by mouth daily with breakfast.     [provider]  fexofenadine (ALLEGRA) 180 MG tablet Take 180 mg by mouth daily with breakfast.     [provider]   fluticasone (FLONASE) 50 MCG/ACT nasal spray INSTILL 2 SPRAYS IN THE  NOSE AT BEDTIME 04/06/17   Susy Frizzle, MD  furosemide (LASIX) 40 MG tablet TAKE 1 TABLET BY MOUTH  DAILY 11/26/21   Troy Sine, MD  Galcanezumab-gnlm (EMGALITY) 120 MG/ML SOAJ Inject 120 mg into the skin every 30 (thirty) days. 01/27/22   Melvenia Beam, MD  JARDIANCE 25 MG TABS tablet TAKE 1 TABLET BY MOUTH DAILY BEFORE BREAKFAST 04/06/22   Susy Frizzle, MD  Lancets Misc. (ACCU-CHEK FASTCLIX LANCET) KIT USE AS DIRECTED TO CHECK BLOOD SUGAR TWICE DAILY 11/16/21   Susy Frizzle, MD  Multiple Vitamin (MULTIVITAMIN WITH MINERALS) TABS tablet Take 1 tablet by mouth daily with breakfast.    [provider]  nitroGLYCERIN (NITROSTAT) 0.4 MG SL tablet PLACE ONE TABLET UNDER THE TONGUE EVERY 5 MINUTES AS NEEDED FOR CHEST PAIN 11/10/21   Baldwin Jamaica, PA-C  oxyCODONE-acetaminophen (PERCOCET) 10-325 MG tablet Take 1 tablet by mouth every 8 (eight) hours as needed for pain. 04/15/22   Susy Frizzle, MD  pantoprazole (PROTONIX) 40 MG tablet TAKE 1 TABLET BY MOUTH  DAILY 06/30/21   Troy Sine, MD  Polyvinyl Alcohol-Povidone (REFRESH OP) Place 1 drop into both eyes at bedtime as needed (dry eyes).    [provider]  potassium chloride (MICRO-K) 10 MEQ CR capsule TAKE 1 CAPSULE BY MOUTH  DAILY 07/10/21   Troy Sine, MD  Rimegepant Sulfate 75 MG TBDP Take 75 mg by mouth daily as needed (Migraine).    [provider]  rosuvastatin (CRESTOR) 40 MG tablet TAKE 1 TABLET BY MOUTH  DAILY 07/10/21   Troy Sine, MD  telmisartan (MICARDIS) 40 MG tablet TAKE 1 TABLET BY MOUTH  DAILY 11/26/21   Troy Sine, MD  tirzepatide [facility]) 7.5 MG/0.5ML Pen Inject 7.5 mg into the skin once a week. 03/29/22   Susy Frizzle, MD  TRULICITY 3 GU/4.4IH SOPN ADMINISTER 3MG UNDER THE SKIN 1 TIME A WEEK AS DIRECTED Patient not taking: Reported on 04/20/2022 02/15/22   Susy Frizzle, MD     Family  History Family History  Problem Relation Age of Onset   Colon cancer Mother        mets from uterine   Uterine cancer Mother    Heart disease Father        and sister   Diabetes Father    Hearing loss Father    Stroke Father    Heart disease Sister    Lung cancer Sister    Stroke Brother     Social History Social History   Tobacco Use   Smoking status: Former    Packs/day: 2.00    Years: 25.00    Total pack years: 50.00    Types: Cigarettes, Cigars    Quit date: 04/22/1988    Years since quitting: 34.0   Smokeless tobacco: Never  Vaping Use   Vaping Use: Never used  Substance Use Topics   Alcohol use: Not Currently   Drug use: No     Allergies   Ibuprofen, Other, Topamax [topiramate], Toprol xl [metoprolol succinate], and Metoprolol-hctz er   Review of Systems Review of Systems  Constitutional:  Positive for activity change. Negative for appetite change, fatigue and fever.  HENT:  Positive for congestion and sinus pressure. Negative for sneezing and sore throat.   Respiratory:  Positive for cough. Negative for shortness of breath.   Cardiovascular:  Negative for chest pain.  Gastrointestinal:  Negative for abdominal pain, diarrhea, nausea and vomiting.  Neurological:  Negative for dizziness, light-headedness and headaches.     Physical Exam Triage Vital Signs ED Triage Vitals  Enc Vitals Group     BP 04/21/22 0948 125/84     Pulse Rate 04/21/22 0948 89     Resp 04/21/22 0948 18     Temp 04/21/22 0948 98.1 F (36.7 C)     Temp Source 04/21/22 0948 Oral     SpO2 04/21/22 0948 95 %     Weight --      Height --      Head Circumference --      Peak Flow --      Pain Score 04/21/22 0947 0     Pain Loc --      Pain Edu? --      Excl. in [place]? --    No data found.  Updated Vital Signs BP 125/84 (BP Location: Left Arm)   Pulse 89   Temp 98.1 F (36.7 C) (Oral)   Resp 18   SpO2 95%   Visual Acuity Right Eye Distance:   Left Eye  Distance:   Bilateral Distance:    Right Eye Near:   Left Eye Near:    Bilateral Near:     Physical Exam Vitals reviewed.  Constitutional:      General: He is awake.     Appearance: Normal appearance. He is well-developed. He is not ill-appearing.     Comments: Very pleasant male appears stated age in no acute distress sitting comfortably in exam room  HENT:     Head: Normocephalic and atraumatic.     Right Ear: Tympanic membrane, ear canal and external ear normal. Tympanic membrane is not erythematous or bulging.     Left Ear: Tympanic membrane, ear canal and external ear normal. Tympanic membrane is not erythematous or bulging.     Nose: Nose normal.     Mouth/Throat:     Pharynx: Uvula midline. Posterior oropharyngeal erythema present. No oropharyngeal exudate or uvula swelling.     Comments: Erythema and drainage in posterior oropharynx  Cardiovascular:     Rate and Rhythm: Normal rate and regular rhythm.     Heart sounds: Normal heart sounds, S1 normal and S2 normal. No murmur heard. Pulmonary:     Effort: Pulmonary effort is normal. No accessory muscle usage or respiratory distress.     Breath sounds: No stridor. Examination of the right-lower field reveals decreased breath sounds. Examination of the left-lower field reveals decreased breath sounds. Decreased breath sounds present. No wheezing, rhonchi or rales.     Comments: Reactive cough with deep breathing Abdominal:     General: Bowel sounds are normal.     Palpations: Abdomen is soft.     Tenderness: There is no abdominal tenderness.  Neurological:     Mental Status: He is alert.  Psychiatric:        Behavior: Behavior is cooperative.      UC Treatments / Results  Labs (all labs ordered are listed, but only abnormal results are displayed) Labs Reviewed - No data to display  EKG   Radiology DG Chest 2 View  Result Date: 04/21/2022 CLINICAL DATA:  Cough. EXAM: CHEST - 2 VIEW COMPARISON:  05/02/2019  FINDINGS: There is a left chest wall pacer device with leads in the right atrial appendage and right ventricle. Heart size is normal. No pleural effusion or edema. No airspace opacities. Status post ACDF within the lower cervical spine. IMPRESSION: No active cardiopulmonary disease. Electronically Signed   By: Kerby Moors M.D.   On: 04/21/2022 10:09    Procedures Procedures (including critical care time)  Medications Ordered in UC Medications  albuterol (VENTOLIN HFA) 108 (90 Base) MCG/ACT inhaler 2 puff (2 puffs Inhalation Given 04/21/22 1002)    Initial Impression / Assessment and Plan / UC Course  I have reviewed the triage vital signs and the nursing notes.  Pertinent labs & imaging results that were available during my care of the patient were reviewed by me and considered in my medical decision making (see chart for details).     Patient is well-appearing, afebrile, nontoxic, nontachycardic.  No indication for viral testing as patient has been symptomatic for more than a week and this would not change management.  Given history of COPD I am concerned for COPD exacerbation.  X-ray was obtained that showed no acute cardiopulmonary disease.  He was given several puffs of albuterol with significant improvement of symptoms.  He was sent home with this medication with instruction to use it every 4-6 hours as needed.  Will cover for COPD exacerbation with cefdinir twice daily for 10 days.  Recommended over-the-counter medications including Mucinex, Flonase, Tylenol.  He is to rest and drink plenty of fluid.  Recommended follow-up with primary care next week.  Discussed that if he has any worsening symptoms he needs to be seen immediately including shortness of breath, worsening cough, weakness or nausea/vomiting interfere with oral intake.  Strict return precautions given.  Final Clinical Impressions(s) / UC Diagnoses   Final diagnoses:  COPD exacerbation [facility])     Discharge Instructions       Your x-ray was normal with no evidence of pneumonia.  I am concerned that you are having a COPD exacerbation.  Please use your albuterol every 4-6 hours as needed.  Use Tessalon up to 3 times a day for cough.  Start Omnicef/cefdinir twice daily for 10 days to cover for infection.  Use Mucinex and Flonase over-the-counter.  Make sure you rest and drink plenty of fluid.  Follow-up with your primary  care next week.  If you have any worsening symptoms including increased cough, worsening shortness of breath, fever, weakness, nausea, vomiting you need to go to the emergency room.     ED Prescriptions     Medication Sig Dispense Auth. Provider   benzonatate (TESSALON) 100 MG capsule Take 1 capsule (100 mg total) by mouth every 8 (eight) hours. 21 capsule Kerston Landeck K, PA-C   cefdinir (OMNICEF) 300 MG capsule Take 1 capsule (300 mg total) by mouth 2 (two) times daily. 20 capsule Arsh Feutz, Derry Skill, PA-C      PDMP not reviewed this encounter.   Terrilee Croak, PA-C 04/21/22 1029

## 2022-04-21 NOTE — Discharge Instructions (Addendum)
Your x-ray was normal with no evidence of pneumonia.  I am concerned that you are having a COPD exacerbation.  Please use your albuterol every 4-6 hours as needed.  Use Tessalon up to 3 times a day for cough.  Start Omnicef/cefdinir twice daily for 10 days to cover for infection.  Use Mucinex and Flonase over-the-counter.  Make sure you rest and drink plenty of fluid.  Follow-up with your primary care next week.  If you have any worsening symptoms including increased cough, worsening shortness of breath, fever, weakness, nausea, vomiting you need to go to the emergency room.

## 2022-04-21 NOTE — ED Triage Notes (Signed)
Pt reports a productive cough x 1 week. States he is coughing up green phlegm. Has tried taking Coricidin HP, Mucinex and Nyquil.

## 2022-04-27 ENCOUNTER — Telehealth: Payer: Self-pay | Admitting: Neurology

## 2022-04-27 ENCOUNTER — Ambulatory Visit: Payer: Medicare Other | Admitting: Neurology

## 2022-04-27 MED ORDER — RIMEGEPANT SULFATE 75 MG PO TBDP
75.0000 mg | ORAL_TABLET | Freq: Every day | ORAL | 11 refills | Status: DC | PRN
  Filled 2022-11-26: qty 16, 30d supply, fill #0

## 2022-04-27 NOTE — Telephone Encounter (Signed)
Jillian or. Angel: Which you please call Lee [name] and reschedule his appointment per physician.  Patient was supposed to come in today however he is doing excellent and we got his Emgality approved.  I just have to refill his Nurtec.  Please reschedule him with an NP sometime in the spring may be in April -June for follow-up.  Thank you.  Pod 4: FYI

## 2022-04-27 NOTE — Addendum Note (Signed)
Addended by: Sarina Ill B on: 04/27/2022 01:09 PM   Modules accepted: Orders

## 2022-04-27 NOTE — Progress Notes (Deleted)
Lee [name]    Provider:  Dr Jaynee Eagles Requesting Provider: Susy Frizzle, MD Primary Care Provider:  Susy Frizzle, MD  CC:  migraines  HPI:  Lee [name] is a 71 y.o. male here as requested by Susy Frizzle, MD for migraines.  Past medical history second-degree Mobitz 2 AV block, heart block complete, symptomatic bradycardia, vasovagal syncope, depression, hypertensive heart disease, hypertension, paroxysmal A-fib, hyperlipidemia, morbid obesity, obstructive sleep apnea, CAD, COPD, depression, DM2, DJD, heart disease, MI, pacemaker, Afib s/p ablation, crdiac caths s/p stents, cervical fusion.  Reviewed notes from [facility], he has tried and failed multiple medications, being treated with Aimovig and Nurtec, using CPAP, they recommended weight loss and he has lost 40 pounds.  Here with wife who provides much information. Migraines for 10+ years, can start unilaterally but often start in the back of the head, pulsating/pounding/throbbing, photophobia/phonophobia, a quiet dark room helps, weather is a trigger, nausea, no vomiting, can last up to a week. He has tried a multitude of medications, Aimovig works the best, no significant side effects, emgality worked better, only 4 migraine days a month and but 15 total headache days a month. Using cpap religiously. Baseline prior to CGRPS was having > 12 migraine days a month and > 20 total headache days a month. Nurtec helps with the headaches as well. He is due to take aimovig September 1st, will change to emgality. They can last 8 hours to 3 days-4 days. No aura or prodrome. No other focal neurologic deficits, associated symptoms, inciting events or modifiable factors.   Reviewed notes, labs and imaging from outside physicians, which showed: Reviewed notes and all prior medications as below:   Current and past medications: ANALGESICS: Anacin, Goody's, codeine, Combunox, Darvon, Excedrin, Kadian, Lidoderm  patch, Lorcet, Percocet, Tylenol, Ultram, Vicodin ANTI-MIGRAINE: Dhe nasal spray, Imitrex, Nurtec HEART/BP: Coreg, propranolol, Toprol, Lotensin, Norvasc, atenolol, Cardizem DECONGESTANT/ANTIHISTAMINE: Allegra, Benadryl, Clarinex, Dramamine Entex T, Flonase, Nasonex, Sudafed, Zyrtec ANTI-NAUSEANT: Dramamine NSAIDS: Advil, Aleve MUSCLE RELAXANTS:  ANTI-CONVULSANTS: Topamax, Depakote, Keppra STEROIDS: Hydrocortisone, prednisone SLEEPING PILLS/TRANQUILIZERS: Ambien, Halcion, Tylenol PM, Valium, Xanax ANTI-DEPRESSANTS: Celexa HERBAL: CoQ10 FIBROMYALGIA:  HORMONAL: OTHER: Emgality, Aimovig(contrindicated his BP is elevated and worse on aimovig and also contraindicated due to constipation) PROCEDURES FOR HEADACHES: Botox  CT head: COMPARISON:  09/20/2018   FINDINGS: 05/03/2020 Brain: No acute infarct or hemorrhage. Lateral ventricles and midline structures are unremarkable. No acute extra-axial fluid collections. No mass effect.   Vascular: No hyperdense vessel or unexpected calcification.   Skull: Laceration left parietal convexity of the scalp. No underlying fracture. Remainder of the calvarium is unremarkable.   Sinuses/Orbits: No acute finding.   Other: None.   IMPRESSION: 1. Scalp laceration at the left parietal convexity. 2. No acute intracranial process.     Electronically Signed   By: Randa Ngo M.D.   On: 05/03/2020 20:35  09/20/2018: MRI brain: FINDINGS: BRAIN: There is no acute infarct, acute hemorrhage or extra-axial collection. The midline structures are normal. No midline shift or other mass effect. The white matter signal is normal for the patient's age. The cerebral and cerebellar volume are age-appropriate. No hydrocephalus. Susceptibility-sensitive sequences show no chronic microhemorrhage or superficial siderosis. No abnormal contrast enhancement.   VASCULAR: The major intracranial arterial and venous sinus flow voids are normal.   SKULL AND UPPER  CERVICAL SPINE: Calvarial bone marrow signal is normal. There is no skull base mass. Visualized upper cervical spine and soft tissues are normal.   SINUSES/ORBITS: No fluid  levels or advanced mucosal thickening. No mastoid or middle ear effusion. The orbits are normal.   IMPRESSION: Normal aging brain.     Latest Ref Rng & Units 02/12/2022    9:55 AM 12/07/2021    2:31 PM 08/25/2021    2:23 PM  CMP  Glucose 70 - 99 mg/dL 116  186  89   BUN 8 - 23 mg/dL _0 Creatinine 0.61 - 1.24 mg/dL 0.74  0.89  0.82   Sodium 135 - 145 mmol/L 136  136  135   Potassium 3.5 - 5.1 mmol/L 4.5  4.5  4.6   Chloride 98 - 111 mmol/L 101  100  100   CO2 22 - 32 mmol/L _1 Calcium 8.9 - 10.3 mg/dL 9.6  9.3  9.5   Total Protein 6.1 - 8.1 g/dL  6.6    Total Bilirubin 0.2 - 1.2 mg/dL  0.4    AST 10 - 35 U/L  14    ALT 9 - 46 U/L  13       Review of Systems: Patient complains of symptoms per HPI as well as the following symptoms migraines. Pertinent negatives and positives per HPI. All others negative.   Social History   Socioeconomic History   Marital status: Married    Spouse name: Katharine Look   Number of children: 2   Years of education: Not on file   Highest education level: Not on file  Occupational History   Occupation: retired  Tobacco Use   Smoking status: Former    Packs/day: 2.00    Years: 25.00    Total pack years: 50.00    Types: Cigarettes, Cigars    Quit date: 04/22/1988    Years since quitting: 34.0   Smokeless tobacco: Never  Vaping Use   Vaping Use: Never used  Substance and Sexual Activity   Alcohol use: Not Currently   Drug use: No   Sexual activity: Not Currently    Birth control/protection: Surgical    Comment: vas  Other Topics Concern   Not on file  Social History Narrative   Retired Social research officer, 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   Currently works part time for [employer].   2 daughters.    Married x 43 years 09/2021.   Caffiene:  1 cup  daily, 2 diet drinks daily. (Caffiene free most time).    Social Determinants of Health   Financial Resource Strain: Low Risk  (08/27/2021)   Overall Financial Resource Strain (CARDIA)    Difficulty of Paying Living Expenses: Not hard at all  Food Insecurity: No Food Insecurity (08/27/2021)   Hunger Vital Sign    Worried About Running Out of Food in the Last Year: Never true    Ran Out of Food in the Last Year: Never true  Transportation Needs: No Transportation Needs (08/27/2021)   PRAPARE - Hydrologist (Medical): No    Lack of Transportation (Non-Medical): No  Physical Activity: Sufficiently Active (08/27/2021)   Exercise Vital Sign    Days of Exercise per Week: 4 days    Minutes of Exercise per Session: 60 min  Stress: No Stress Concern Present (08/27/2021)   [place]    Feeling of Stress : Not at all  Social Connections: [place] (08/27/2021)   Social Connection and Isolation Panel [NHANES]    Frequency of Communication  with Friends and Family: More than three times a week    Frequency of Social Gatherings with Friends and Family: More than three times a week    Attends Religious Services: More than 4 times per year    Active Member of [employer] or Organizations: Yes    Attends Music therapist: More than 4 times per year    Marital Status: Married  Human resources officer Violence: Not At Risk (08/27/2021)   Humiliation, Afraid, Rape, and Kick questionnaire    Fear of Current or Ex-Partner: No    Emotionally Abused: No    Physically Abused: No    Sexually Abused: No    Family History  Problem Relation Age of Onset   Colon cancer Mother        mets from uterine   Uterine cancer Mother    Heart disease Father        and sister   Diabetes Father    Hearing loss Father    Stroke Father    Heart disease Sister    Lung cancer Sister    Stroke Brother     Past  Medical History:  Diagnosis Date   Allergic rhinitis    chronic sinusitis   Arthritis    osetoarthritis   CAD (coronary artery disease)    a. 2009 PCI/DES to mLAD; b. 05/2010 s/p DES RCA and LAD.; c. 05/2013 Cath: LAD mild ISR, LCX nl, RCA 40p, patent stents.   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   Cataract    has had lasik surg   COPD (chronic obstructive pulmonary disease) ([place])    Depression    Diabetes mellitus type 2, controlled, without complications ([place])    Diabetes mellitus without complication ([place])    Phreesia 08/22/2020   Diverticulosis    DJD (degenerative joint disease)    Esophagitis 1991   grade 1   GERD (gastroesophageal reflux disease)    Hiatal hernia   Hearing loss    wears bilateral hearing aids   Heart murmur    Dr Dennard Schaumann dx per patient   Hemorrhoids    Hyperlipidemia    Hypertension    Hypertensive heart disease    Iron deficiency anemia    Migraines    Myocardial infarction [facility])    Paroxysmal atrial fibrillation ([place])    a. s/p afib ablation 10/22/08 ([facility]).   PVC's (premature ventricular contractions)    Sleep apnea    Uses nightly.  Questionalble: RDI during the total sleep time 6h 28 mins was 3.55/hr during REM sleep was at 5.71/hr. Supine AHI was 5.93/hr.   Syncope 08/2018    Patient Active Problem List   Diagnosis Date Noted   Rupture of left distal biceps tendon 02/09/2022   Pain in left elbow 01/28/2022   Chronic migraine without aura with status migrainosus, not intractable 01/27/2022   Primary osteoarthritis of first carpometacarpal joint of right hand 09/02/2021   Pain in right knee 02/12/2020   Second degree Mobitz II AV block 04/30/2019   Syncope and collapse 10/10/2018   Heart block AV complete ([place]) 10/10/2018   Symptomatic bradycardia 10/10/2018   Syncope, vasovagal 09/20/2018   Neck mass 10/14/2016   Depression 05/20/2016   Hypertensive heart disease    Paroxysmal atrial fibrillation ([place])     Hyperlipidemia LDL goal <70 03/27/2014   Premature ventricular contraction 03/04/2014   Chest pain with high risk of acute coronary syndrome 06/28/2013   Morbid obesity (  [place]) 06/28/2013   OSA (obstructive sleep apnea) 12/18/2012   Sleepiness 11/06/2012   Fatigue 11/06/2012   PALPITATIONS 06/02/2009   DYSLIPIDEMIA 09/05/2008   Essential hypertension 09/05/2008   CAD S/P percutaneous coronary angioplasty 09/05/2008   RBBB 09/05/2008   SUPRAVENTRICULAR TACHYCARDIA 09/05/2008   ALLERGIC RHINITIS 09/05/2008   GERD 09/05/2008   BACK PAIN 09/05/2008   ARRHYTHMIA, HX OF 09/05/2008   MIGRAINES, HX OF 09/05/2008   Dyslipidemia 09/05/2008    Past Surgical History:  Procedure Laterality Date   ABDOMINAL HYSTERECTOMY     CARDIAC CATHETERIZATION  05/31/2010   The proximal LAD was then predilated with 2.0 x 12 trek. this was then stented with a 2.5 x 16 promus Element drug-eluting stent at 14 atmosphere and prostdilated with 2.75 x 12 [place] trek at 16 atmospheres(2.8 mm) resulting the reduction of 80% proximal LAD stenosis to 0% residual with excellent flow.   CARDIAC CATHETERIZATION  06/05/2010   Successful percutaneous coronary intervention to the right coronary artery with percutaneous transluminal coronary angioplasty/stenting and insertion 3.0 x 16 mm Promus DES post dilated to 3.35 mm with stenosis being reduced to 0%   CARDIAC CATHETERIZATION  02/29/2008   Post dilatation was performed using a 2.75 x 9 [place] sprinter, 10 atmospheres for 40 seconds and then 9 atmosphere for 35 sec. This resulted in the 80% area of narrowing pre-intervention, now appearing to normal. There was no edvidence of the dissection or thrombus and there was TIMI III flow pre and post.   CARDIAC CATHETERIZATION  02/28/2006   CORONARY ANGIOPLASTY WITH STENT PLACEMENT     3 stents/ 4 caths   DISTAL BICEPS TENDON REPAIR Left 02/12/2022   Procedure: LEFT DISTAL BICEPS TENDON REPAIR;  Surgeon: Leandrew Koyanagi, MD;  Location: [facility];   Service: Orthopedics;  Laterality: Left;   DISTAL BICEPS TENDON REPAIR Left 03/03/2022   Procedure: LEFT DISTAL BICEPS TENDON REPAIR;  Surgeon: Leandrew Koyanagi, MD;  Location: [facility];  Service: Orthopedics;  Laterality: Left;   EYE SURGERY     FINGER ARTHROPLASTY Right 09/02/2021   Procedure: RIGHT THUMB CARPOMETACARPAL ARTHROPLASTY;  Surgeon: Leandrew Koyanagi, MD;  Location: [facility];  Service: Orthopedics;  Laterality: Right;  block in preop   KNEE ARTHROSCOPY     right   LASIK     LEFT HEART CATHETERIZATION WITH CORONARY ANGIOGRAM N/A 06/29/2013   Procedure: LEFT HEART CATHETERIZATION WITH CORONARY ANGIOGRAM;  Surgeon: Leonie Man, MD;  Location: [facility] CATH LAB;  Service: Cardiovascular;  Laterality: N/A;   NASAL SINUS SURGERY  06/01/1999   NECK SURGERY     Cervical fusion   PACEMAKER IMPLANT N/A 05/01/2019   Procedure: PACEMAKER IMPLANT;  Surgeon: [name], MD;  Location: [facility] CV LAB;  Service: Cardiovascular;  Laterality: N/A;   RADIOFREQUENCY ABLATION  09/28/2008   atrial fibrillation   ROTATOR CUFF REPAIR     left   SHOULDER OPEN ROTATOR CUFF REPAIR     right   SPINE SURGERY     WRIST ARTHROCENTESIS     left    Current Outpatient Medications  Medication Sig Dispense Refill   ACCU-CHEK GUIDE test strip USE TO CHECK BLOOD SUGAR LEVELS TWICE DAILY. 100 strip 1   acetaminophen (TYLENOL) 325 MG tablet Take 1-2 tablets (325-650 mg total) by mouth every 4 (four) hours as needed for mild pain.     amLODipine (NORVASC) 10 MG tablet TAKE 1 TABLET(10 MG) BY MOUTH DAILY 90 tablet 1   aspirin  EC 81 MG tablet Take 81 mg by mouth at bedtime.     benzonatate (TESSALON) 100 MG capsule Take 1 capsule (100 mg total) by mouth every 8 (eight) hours. 21 capsule 0   Blood Glucose Monitoring Suppl (ACCU-CHEK AVIVA PLUS) w/Device KIT Check BS bid E11.9 1 kit 1   cefdinir (OMNICEF) 300 MG capsule Take 1 capsule (300 mg total) by mouth 2 (two) times daily. 20 capsule 0    cholecalciferol (VITAMIN D) 1000 UNITS tablet Take 1,000 Units by mouth at bedtime.      citalopram (CELEXA) 40 MG tablet TAKE 1 TABLET(40 MG) BY MOUTH DAILY 90 tablet 1   Coenzyme Q10 (CO Q 10) 100 MG CAPS Take 100 mg by mouth daily with breakfast.      fexofenadine (ALLEGRA) 180 MG tablet Take 180 mg by mouth daily with breakfast.      fluticasone (FLONASE) 50 MCG/ACT nasal spray INSTILL 2 SPRAYS IN THE  NOSE AT BEDTIME 48 g 3   furosemide (LASIX) 40 MG tablet TAKE 1 TABLET BY MOUTH  DAILY 90 tablet 3   Galcanezumab-gnlm (EMGALITY) 120 MG/ML SOAJ Inject 120 mg into the skin every 30 (thirty) days. 1.12 mL 11   JARDIANCE 25 MG TABS tablet TAKE 1 TABLET BY MOUTH DAILY BEFORE BREAKFAST 90 tablet 1   Lancets Misc. (ACCU-CHEK FASTCLIX LANCET) KIT USE AS DIRECTED TO CHECK BLOOD SUGAR TWICE DAILY 1 kit 1   Multiple Vitamin (MULTIVITAMIN WITH MINERALS) TABS tablet Take 1 tablet by mouth daily with breakfast.     nitroGLYCERIN (NITROSTAT) 0.4 MG SL tablet PLACE ONE TABLET UNDER THE TONGUE EVERY 5 MINUTES AS NEEDED FOR CHEST PAIN 25 tablet 3   oxyCODONE-acetaminophen (PERCOCET) 10-325 MG tablet Take 1 tablet by mouth every 8 (eight) hours as needed for pain. 21 tablet 0   pantoprazole (PROTONIX) 40 MG tablet TAKE 1 TABLET BY MOUTH  DAILY 90 tablet 3   Polyvinyl Alcohol-Povidone (REFRESH OP) Place 1 drop into both eyes at bedtime as needed (dry eyes).     potassium chloride (MICRO-K) 10 MEQ CR capsule TAKE 1 CAPSULE BY MOUTH  DAILY 90 capsule 3   Rimegepant Sulfate 75 MG TBDP Take 75 mg by mouth daily as needed (Migraine).     rosuvastatin (CRESTOR) 40 MG tablet TAKE 1 TABLET BY MOUTH  DAILY 90 tablet 3   telmisartan (MICARDIS) 40 MG tablet TAKE 1 TABLET BY MOUTH  DAILY 90 tablet 3   tirzepatide (MOUNJARO) 7.5 MG/0.5ML Pen Inject 7.5 mg into the skin once a week. 6 mL 1   TRULICITY 3 ZO/1.0RU SOPN ADMINISTER 3MG UNDER THE SKIN 1 TIME A WEEK AS DIRECTED (Patient not taking: Reported on 04/20/2022) 2 mL 2    No current facility-administered medications for this visit.    Allergies as of 04/27/2022 - Review Complete 04/21/2022  Allergen Reaction Noted   Ibuprofen Swelling 07/18/2012   Other Shortness Of Breath 07/18/2012   Topamax [topiramate] Other (See Comments) 09/11/2015   Toprol xl [metoprolol succinate] Other (See Comments) 08/27/2010   Metoprolol-hctz er Other (See Comments) 08/29/2017    Vitals: There were no vitals taken for this visit. Last Weight:  Wt Readings from Last 1 Encounters:  03/29/22 216 lb 9.6 oz (98.2 kg)   Last Height:   Ht Readings from Last 1 Encounters:  03/29/22 _0  (1.727 m)     Physical exam: Exam: Gen: NAD, conversant, well nourised, obese, well groomed  CV: RRR, no MRG. No Carotid Bruits. No peripheral edema, warm, nontender Eyes: Conjunctivae clear without exudates or hemorrhage  Neuro: Detailed Neurologic Exam  Speech:    Speech is normal; fluent and spontaneous with normal comprehension.  Cognition:    The patient is oriented to person, place, and time;     recent and remote memory intact;     language fluent;     normal attention, concentration,     fund of knowledge Cranial Nerves:    The pupils are equal, round, and reactive to light.  Attempted, pupils too small to visualize fundi.  Visual fields are full to finger confrontation. Extraocular movements are intact. Trigeminal sensation is intact and the muscles of mastication are normal. The face is symmetric. The palate elevates in the midline. Hearing intact. Voice is normal. Shoulder shrug is normal. The tongue has normal motion without fasciculations.   Coordination:    Normal   Gait:    normal.   Motor Observation:    No asymmetry, no atrophy, and no involuntary movements noted. Tone:    Normal muscle tone.    Posture:    Posture is normal. normal erect    Strength:    Strength is V/V in the upper and lower limbs.      Sensation: intact to LT      Reflex Exam:  DTR's:    Deep tendon reflexes in the upper and lower extremities are symmetrical bilaterally.   Toes:    The toes are equivocal bilaterally.   Clonus:    Clonus is absent.    Assessment/Plan: Patient with chronic migraines; 71 y.o. male here as requested by Susy Frizzle, MD for migraines..  Complicated past medical history including  Past medical history second-degree Mobitz 2 AV block, heart block complete, symptomatic bradycardia, vasovagal syncope, depression, hypertensive heart disease, hypertension, paroxysmal A-fib, hyperlipidemia, morbid obesity, obstructive sleep apnea, CAD, COPD, depression, DM2, DJD, heart disease, MI, pacemaker, Afib s/p ablation, crdiac caths s/p stents, cervical fusion.  Reviewed notes from [facility], he has tried and failed multiple medications, being treated with Aimovig and Nurtec, using CPAP, they recommended weight loss and he has lost 40 pounds.  -Patient has chronic migraines for over 10 years.  He is tried and failed a plethora of medication (see above in HPI) he was started on Emgality which worked much better than Aimovig, Aimovig is not working as well as it used to, also his blood pressure has been elevated with can be a side effect to [place].  At this time we will switch back to [facility] to see if we can reduce his migraine and headache burden.  At baseline patient was having greater than 12 moderate to severe migraine days a month and greater than 20 total headache days a month.  With the [place] he has approximately 4 migraines a month but 15 total headache days a month, Emgality worked better, we will switch.  Continue Nurtec as needed.  -Patient does have extensive vascular risk factors, we did discuss that there has been some literature showing that CGRP are medications that may cause vasoconstriction, patient understands the risks, however these have not been formally added to the side effect profile by the  FDA.  Change to Emgality but overlap with aimovig as I have seen Aimovig cause severe rebound headaches- emgality is once a month Nurtec: Increase to 16 a month and you can take it as needed, Also remember Nurtec lasts a long time so you  can "pre-treat"  Follow up 12 weeks in office   No orders of the defined types were placed in this encounter.   Cc: Susy Frizzle, MD,  Susy Frizzle, MD  Sarina Ill, MD  [name] [address] [place], [place] [address]  Phone [phone] Fax [phone]  I spent over 60 minutes of face-to-face and non-face-to-face time with patient on the  No diagnosis found.  diagnosis.  This included previsit chart review, lab review, study review, order entry, electronic health record documentation, patient education on the different diagnostic and therapeutic options, counseling and coordination of care, risks and benefits of management, compliance, or risk factor reduction

## 2022-04-27 NOTE — Telephone Encounter (Signed)
Spoke with pt. Scheduled f/u with Ward Givens for 11/08/22 at 11:00 am.

## 2022-04-28 ENCOUNTER — Encounter: Payer: Self-pay | Admitting: Family Medicine

## 2022-04-28 ENCOUNTER — Other Ambulatory Visit: Payer: Self-pay

## 2022-04-28 DIAGNOSIS — E118 Type 2 diabetes mellitus with unspecified complications: Secondary | ICD-10-CM

## 2022-04-28 MED ORDER — ACCU-CHEK FASTCLIX LANCETS MISC
Freq: Two times a day (BID) | 5 refills | Status: DC
  Filled 2022-11-26: qty 102, 51d supply, fill #0
  Filled 2023-02-22: qty 102, 51d supply, fill #1

## 2022-05-04 ENCOUNTER — Encounter (HOSPITAL_BASED_OUTPATIENT_CLINIC_OR_DEPARTMENT_OTHER): Payer: Self-pay

## 2022-05-04 ENCOUNTER — Ambulatory Visit (HOSPITAL_BASED_OUTPATIENT_CLINIC_OR_DEPARTMENT_OTHER): Payer: Medicare Other | Admitting: Physical Therapy

## 2022-05-11 ENCOUNTER — Encounter (HOSPITAL_BASED_OUTPATIENT_CLINIC_OR_DEPARTMENT_OTHER): Payer: Self-pay | Admitting: Physical Therapy

## 2022-05-11 ENCOUNTER — Ambulatory Visit (HOSPITAL_BASED_OUTPATIENT_CLINIC_OR_DEPARTMENT_OTHER): Payer: Medicare Other | Attending: Physician Assistant | Admitting: Physical Therapy

## 2022-05-11 DIAGNOSIS — M6281 Muscle weakness (generalized): Secondary | ICD-10-CM | POA: Insufficient documentation

## 2022-05-11 DIAGNOSIS — M25522 Pain in left elbow: Secondary | ICD-10-CM | POA: Diagnosis present

## 2022-05-11 NOTE — Therapy (Signed)
OUTPATIENT PHYSICAL THERAPY UPPER EXTREMITY TREATMENT   Patient Name: Lee [name] MRN: [id number] DOB:10-21-1950, 71 y.o., male Today's Date: 05/11/2022  END OF SESSION:  PT End of Session - 05/11/22 0853     Visit Number 2    Number of Visits 8    Date for PT Re-Evaluation 06/15/22    PT Start Time 0848    PT Stop Time 0927    PT Time Calculation (min) 39 min    Activity Tolerance Patient tolerated treatment well    Behavior During Therapy [facility] for tasks assessed/performed              Past Medical History:  Diagnosis Date   Allergic rhinitis    chronic sinusitis   Arthritis    osetoarthritis   CAD (coronary artery disease)    a. 2009 PCI/DES to mLAD; b. 05/2010 s/p DES RCA and LAD.; c. 05/2013 Cath: LAD mild ISR, LCX nl, RCA 40p, patent stents.   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   Cataract    has had lasik surg   COPD (chronic obstructive pulmonary disease) ([place])    Depression    Diabetes mellitus type 2, controlled, without complications ([place])    Diabetes mellitus without complication ([place])    Phreesia 08/22/2020   Diverticulosis    DJD (degenerative joint disease)    Esophagitis 1991   grade 1   GERD (gastroesophageal reflux disease)    Hiatal hernia   Hearing loss    wears bilateral hearing aids   Heart murmur    Dr Dennard Schaumann dx per patient   Hemorrhoids    Hyperlipidemia    Hypertension    Hypertensive heart disease    Iron deficiency anemia    Migraines    Myocardial infarction [facility])    Paroxysmal atrial fibrillation ([place])    a. s/p afib ablation 10/22/08 (J. Allred).   PVC's (premature ventricular contractions)    Sleep apnea    Uses nightly.  Questionalble: RDI during the total sleep time 6h 28 mins was 3.55/hr during REM sleep was at 5.71/hr. Supine AHI was 5.93/hr.   Syncope 08/2018   Past Surgical History:  Procedure Laterality Date   ABDOMINAL HYSTERECTOMY     CARDIAC CATHETERIZATION  05/31/2010   The  proximal LAD was then predilated with 2.0 x 12 trek. this was then stented with a 2.5 x 16 promus Element drug-eluting stent at 14 atmosphere and prostdilated with 2.75 x 12 [place] trek at 16 atmospheres(2.8 mm) resulting the reduction of 80% proximal LAD stenosis to 0% residual with excellent flow.   CARDIAC CATHETERIZATION  06/05/2010   Successful percutaneous coronary intervention to the right coronary artery with percutaneous transluminal coronary angioplasty/stenting and insertion 3.0 x 16 mm Promus DES post dilated to 3.35 mm with stenosis being reduced to 0%   CARDIAC CATHETERIZATION  02/29/2008   Post dilatation was performed using a 2.75 x 9 [place] sprinter, 10 atmospheres for 40 seconds and then 9 atmosphere for 35 sec. This resulted in the 80% area of narrowing pre-intervention, now appearing to normal. There was no edvidence of the dissection or thrombus and there was TIMI III flow pre and post.   CARDIAC CATHETERIZATION  02/28/2006   CORONARY ANGIOPLASTY WITH STENT PLACEMENT     3 stents/ 4 caths   DISTAL BICEPS TENDON REPAIR Left 02/12/2022   Procedure: LEFT DISTAL BICEPS TENDON REPAIR;  Surgeon: Leandrew Koyanagi, MD;  Location: [place];  Service: Orthopedics;  Laterality: Left;   DISTAL BICEPS TENDON REPAIR Left 03/03/2022   Procedure: LEFT DISTAL BICEPS TENDON REPAIR;  Surgeon: Leandrew Koyanagi, MD;  Location: [place];  Service: Orthopedics;  Laterality: Left;   EYE SURGERY     FINGER ARTHROPLASTY Right 09/02/2021   Procedure: RIGHT THUMB CARPOMETACARPAL ARTHROPLASTY;  Surgeon: Leandrew Koyanagi, MD;  Location: [place];  Service: Orthopedics;  Laterality: Right;  block in preop   KNEE ARTHROSCOPY     right   LASIK     LEFT HEART CATHETERIZATION WITH CORONARY ANGIOGRAM N/A 06/29/2013   Procedure: LEFT HEART CATHETERIZATION WITH CORONARY ANGIOGRAM;  Surgeon: Leonie Man, MD;  Location: [facility] CATH LAB;  Service: Cardiovascular;  Laterality: N/A;   NASAL SINUS SURGERY  06/01/1999   NECK  SURGERY     Cervical fusion   PACEMAKER IMPLANT N/A 05/01/2019   Procedure: PACEMAKER IMPLANT;  Surgeon: [name], MD;  Location: [place] CV LAB;  Service: Cardiovascular;  Laterality: N/A;   RADIOFREQUENCY ABLATION  09/28/2008   atrial fibrillation   ROTATOR CUFF REPAIR     left   SHOULDER OPEN ROTATOR CUFF REPAIR     right   SPINE SURGERY     WRIST ARTHROCENTESIS     left   Patient Active Problem List   Diagnosis Date Noted   Rupture of left distal biceps tendon 02/09/2022   Pain in left elbow 01/28/2022   Chronic migraine without aura with status migrainosus, not intractable 01/27/2022   Primary osteoarthritis of first carpometacarpal joint of right hand 09/02/2021   Pain in right knee 02/12/2020   Second degree Mobitz II AV block 04/30/2019   Syncope and collapse 10/10/2018   Heart block AV complete ([place]) 10/10/2018   Symptomatic bradycardia 10/10/2018   Syncope, vasovagal 09/20/2018   Neck mass 10/14/2016   Depression 05/20/2016   Hypertensive heart disease    Paroxysmal atrial fibrillation ([place])    Hyperlipidemia LDL goal <70 03/27/2014   Premature ventricular contraction 03/04/2014   Chest pain with high risk of acute coronary syndrome 06/28/2013   Morbid obesity ([place]) 06/28/2013   OSA (obstructive sleep apnea) 12/18/2012   Sleepiness 11/06/2012   Fatigue 11/06/2012   PALPITATIONS 06/02/2009   DYSLIPIDEMIA 09/05/2008   Essential hypertension 09/05/2008   CAD S/P percutaneous coronary angioplasty 09/05/2008   RBBB 09/05/2008   SUPRAVENTRICULAR TACHYCARDIA 09/05/2008   ALLERGIC RHINITIS 09/05/2008   GERD 09/05/2008   BACK PAIN 09/05/2008   ARRHYTHMIA, HX OF 09/05/2008   MIGRAINES, HX OF 09/05/2008   Dyslipidemia 09/05/2008    PCP: Dr Jenna Luo   REFERRING PROVIDER: Tiney Rouge PA   REFERRING DIAG: Left Distal Biceps Repair ( 2nd repair)   THERAPY DIAG:  Pain in left elbow  Muscle weakness (generalized)  Rationale for Evaluation and  Treatment: Rehabilitation  ONSET DATE: 03/04/2022  SUBJECTIVE:  SUBJECTIVE STATEMENT:  Had to go to the ED recently because of a bad cough, they ruled out Covid and thought it was a COPD exacerbation, feeling a lot better now. Arm feels good, no problems, have been doing a lot of yard work.   PERTINENT HISTORY:   PAIN:  Are you having pain? Yes: NPRS scale: 0/10; no pain for the past month, just numbness   PRECAUTIONS: None  WEIGHT BEARING RESTRICTIONS: No  FALLS:  Has patient fallen in last 6 months? No  LIVING ENVIRONMENT: Nothing significant  OCCUPATION: Works part time at [employer] of [employer]   Hobbies: wood working and walking   PLOF: Independent  PATIENT GOALS:  To have full function of his dominant arm   NEXT MD VISIT: 12/27 with surgeon   OBJECTIVE:   DIAGNOSTIC FINDINGS:  Nothing post op   PATIENT SURVEYS:  FOTO    COGNITION: Overall cognitive status: Within functional limits for tasks assessed     SENSATION: WFL  POSTURE: No significant limitations   UPPER EXTREMITY ROM:   Active ROM Right eval Left eval  Shoulder flexion    Shoulder extension    Shoulder abduction    Shoulder adduction    Shoulder internal rotation    Shoulder external rotation    Elbow flexion  Full   Elbow extension  Full   Wrist flexion    Wrist extension    Wrist ulnar deviation    Wrist radial deviation    Wrist pronation    Wrist supination    (Blank rows = not tested) UPPER EXTREMITY MMT:  MMT Right eval Left eval  Shoulder flexion 5/5 4/5  Shoulder extension    Shoulder abduction    Shoulder adduction    Shoulder internal rotation    Shoulder external rotation    Middle trapezius    Lower trapezius    Elbow flexion  Not taken 2nd to recent surgery   Elbow extension     Wrist flexion    Wrist extension    Wrist ulnar deviation    Wrist radial deviation    Wrist pronation    Wrist supination    Grip strength (lbs)    (Blank rows = not tested)  PALPATION:  No unexpected tenderness to palpation     TODAY'S TREATMENT:                                                                                                                                         DATE:    05/11/22  TherEx  Wall pushups x10  Forearm planks on door 3x30 seconds Composite standing ROM from flexion with ER to extension with IR and vice versa x15 each  Push ups on elevated mat table x10  Quadruped with elbows extended x20 lateral wt shifts Quadruped with UE flexion x20   Knee planks on forearms 3x30 seconds  Scapular retractions with  red TB x12 with 5 second holds  Shoulder extensions with red TB x12 with 5 second holds Isometric (submax 50%)  biceps flexion and extension 2x10 each surgical UE  Submax isometric 50% elbow supination and pronation x10     PATIENT EDUCATION: Education details: HEP updates, progressions and limitations of protocol today Person educated: Patient Education method: Explanation, Demonstration, Tactile cues, Verbal cues, and Handouts Education comprehension: verbalized understanding, returned demonstration, verbal cues required, tactile cues required, and needs further education  HOME EXERCISE PROGRAM: Access Code: [email or web address] URL: [email or web address] Date: 05/11/2022 Prepared by: Deniece Ree  Exercises - Seated Forearm Pronation and Supination with Hammer  - 1 x daily - 7 x weekly - 3 sets - 10 reps - Seated Elbow Flexion and Extension AROM  - 1 x daily - 7 x weekly - 3 sets - 10 reps - Shoulder extension with resistance - Neutral  - 1 x daily - 7 x weekly - 3 sets - 10 reps - Scapular Retraction with Resistance  - 1 x daily - 7 x weekly - 3 sets - 10 reps - Standing Shoulder Flexion to 90 Degrees with Dumbbells  - 1 x daily -  7 x weekly - 3 sets - 10 reps - Wall Push Up  - 1 x daily - 7 x weekly - 3 sets - 10 reps - Quadruped Alternating Arm Lift  - 1 x daily - 7 x weekly - 3 sets - 10 reps  ASSESSMENT:  CLINICAL IMPRESSION:  Lee [name] arrives doing well today, sounds like he has been pain free for some time now. He will be 10 weeks out from second repair tomorrow, we progressed weight bearing exercises and bicep loading as tolerated and appropriate per distal biceps repair protocol. Sounds like he may have progressed himself past current limits of protocol with yard work at home but still pain free. Sees MD on the 27th.   OBJECTIVE IMPAIRMENTS: decreased ROM, decreased strength, impaired UE functional use, and pain.   ACTIVITY LIMITATIONS: carrying, lifting, self feeding, and reach over head  PARTICIPATION LIMITATIONS: meal prep, cleaning, laundry, community activity, occupation, and yard work  PERSONAL FACTORS: 3+ comorbidities: CAD; DMII, OA of the right hand;  are also affecting patient's functional outcome.   REHAB POTENTIAL: Excellent  CLINICAL DECISION MAKING: Stable/uncomplicated  EVALUATION COMPLEXITY: Low  GOALS: Goals reviewed with patient? Yes  SHORT TERM GOALS: Target date: 12/12  Patient will increase shoulder flexion strength to 4+ out of 5 Baseline: Goal status: INITIAL  2.  Patient will have basic exercise strength and strengthening program Baseline:  Goal status: INITIAL  3.  Patient will reach overhead with 1 pound weight without an increased pain Baseline:  Goal status: INITIAL   LONG TERM GOALS: Target date:   Patient will return to woodworking without pain Baseline:  Goal status: INITIAL  2.  Patient will return to light gym program without pain Baseline:  Goal status: INITIAL  3.  Patient will have full exercise program to promote shoulder and bicep strength Baseline:  Goal status: INITIAL  PLAN: PT FREQUENCY: 1x/week  PT DURATION: 8 weeks  PLANNED  INTERVENTIONS: Therapeutic exercises, Therapeutic activity, Neuromuscular re-education, Patient/Family education, Self Care, Joint mobilization, Cryotherapy, Moist heat, Taping, and Manual therapy  PLAN FOR NEXT SESSION: Review tolerance to HEP, advance weights with HEP, and standing shoulder scaption, patient will likely only need 1-2 visits to progress HEP that may be of visit further along when he is ready for return to gym exercises.  Ann Lions PT DPT PN2  05/11/2022, 9:27 AM

## 2022-05-13 ENCOUNTER — Other Ambulatory Visit: Payer: Self-pay | Admitting: Cardiovascular Disease

## 2022-05-15 ENCOUNTER — Encounter: Payer: Self-pay | Admitting: Family Medicine

## 2022-05-17 ENCOUNTER — Other Ambulatory Visit: Payer: Self-pay | Admitting: Family Medicine

## 2022-05-17 MED ORDER — TIRZEPATIDE 10 MG/0.5ML ~~LOC~~ SOAJ: 10.0000 mg | SUBCUTANEOUS | 1 refills | Status: DC

## 2022-05-18 ENCOUNTER — Ambulatory Visit (HOSPITAL_BASED_OUTPATIENT_CLINIC_OR_DEPARTMENT_OTHER): Payer: Medicare Other | Admitting: Physical Therapy

## 2022-05-18 ENCOUNTER — Encounter (HOSPITAL_BASED_OUTPATIENT_CLINIC_OR_DEPARTMENT_OTHER): Payer: Self-pay | Admitting: Physical Therapy

## 2022-05-18 DIAGNOSIS — M25522 Pain in left elbow: Secondary | ICD-10-CM

## 2022-05-18 DIAGNOSIS — M6281 Muscle weakness (generalized): Secondary | ICD-10-CM

## 2022-05-18 NOTE — Therapy (Signed)
OUTPATIENT PHYSICAL THERAPY UPPER EXTREMITY TREATMENT   Patient Name: Lee [name] MRN: [id number] DOB:[birth year], 71 y.o., male Today's Date: 05/18/2022  END OF SESSION:  PT End of Session - 05/18/22 0854     Visit Number 3    Number of Visits 8    Date for PT Re-Evaluation 06/15/22    PT Start Time 0847    PT Stop Time 0927    PT Time Calculation (min) 40 min    Activity Tolerance Patient tolerated treatment well    Behavior During Therapy [facility] for tasks assessed/performed               Past Medical History:  Diagnosis Date   Allergic rhinitis    chronic sinusitis   Arthritis    osetoarthritis   CAD (coronary artery disease)    a. 2009 PCI/DES to mLAD; b. 05/2010 s/p DES RCA and LAD.; c. 05/2013 Cath: LAD mild ISR, LCX nl, RCA 40p, patent stents.   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   Cataract    has had lasik surg   COPD (chronic obstructive pulmonary disease) ([place])    Depression    Diabetes mellitus type 2, controlled, without complications ([place])    Diabetes mellitus without complication ([place])    Phreesia 08/22/2020   Diverticulosis    DJD (degenerative joint disease)    Esophagitis 1991   grade 1   GERD (gastroesophageal reflux disease)    Hiatal hernia   Hearing loss    wears bilateral hearing aids   Heart murmur    Dr Dennard Schaumann dx per patient   Hemorrhoids    Hyperlipidemia    Hypertension    Hypertensive heart disease    Iron deficiency anemia    Migraines    Myocardial infarction [facility])    Paroxysmal atrial fibrillation ([place])    a. s/p afib ablation 10/22/08 (J. Allred).   PVC's (premature ventricular contractions)    Sleep apnea    Uses nightly.  Questionalble: RDI during the total sleep time 6h 28 mins was 3.55/hr during REM sleep was at 5.71/hr. Supine AHI was 5.93/hr.   Syncope 08/2018   Past Surgical History:  Procedure Laterality Date   ABDOMINAL HYSTERECTOMY     CARDIAC CATHETERIZATION  05/31/2010   The  proximal LAD was then predilated with 2.0 x 12 trek. this was then stented with a 2.5 x 16 promus Element drug-eluting stent at 14 atmosphere and prostdilated with 2.75 x 12 [place] trek at 16 atmospheres(2.8 mm) resulting the reduction of 80% proximal LAD stenosis to 0% residual with excellent flow.   CARDIAC CATHETERIZATION  06/05/2010   Successful percutaneous coronary intervention to the right coronary artery with percutaneous transluminal coronary angioplasty/stenting and insertion 3.0 x 16 mm Promus DES post dilated to 3.35 mm with stenosis being reduced to 0%   CARDIAC CATHETERIZATION  02/29/2008   Post dilatation was performed using a 2.75 x 9 [place] sprinter, 10 atmospheres for 40 seconds and then 9 atmosphere for 35 sec. This resulted in the 80% area of narrowing pre-intervention, now appearing to normal. There was no edvidence of the dissection or thrombus and there was TIMI III flow pre and post.   CARDIAC CATHETERIZATION  02/28/2006   CORONARY ANGIOPLASTY WITH STENT PLACEMENT     3 stents/ 4 caths   DISTAL BICEPS TENDON REPAIR Left 02/12/2022   Procedure: LEFT DISTAL BICEPS TENDON REPAIR;  Surgeon: Leandrew Koyanagi, MD;  Location: [place];  Service: Orthopedics;  Laterality: Left;   DISTAL BICEPS TENDON REPAIR Left 03/03/2022   Procedure: LEFT DISTAL BICEPS TENDON REPAIR;  Surgeon: Leandrew Koyanagi, MD;  Location: [place];  Service: Orthopedics;  Laterality: Left;   EYE SURGERY     FINGER ARTHROPLASTY Right 09/02/2021   Procedure: RIGHT THUMB CARPOMETACARPAL ARTHROPLASTY;  Surgeon: Leandrew Koyanagi, MD;  Location: [place];  Service: Orthopedics;  Laterality: Right;  block in preop   KNEE ARTHROSCOPY     right   LASIK     LEFT HEART CATHETERIZATION WITH CORONARY ANGIOGRAM N/A 06/29/2013   Procedure: LEFT HEART CATHETERIZATION WITH CORONARY ANGIOGRAM;  Surgeon: Leonie Man, MD;  Location: [facility] CATH LAB;  Service: Cardiovascular;  Laterality: N/A;   NASAL SINUS SURGERY  06/01/1999   NECK  SURGERY     Cervical fusion   PACEMAKER IMPLANT N/A 05/01/2019   Procedure: PACEMAKER IMPLANT;  Surgeon: [name], MD;  Location: [place] CV LAB;  Service: Cardiovascular;  Laterality: N/A;   RADIOFREQUENCY ABLATION  09/28/2008   atrial fibrillation   ROTATOR CUFF REPAIR     left   SHOULDER OPEN ROTATOR CUFF REPAIR     right   SPINE SURGERY     WRIST ARTHROCENTESIS     left   Patient Active Problem List   Diagnosis Date Noted   Rupture of left distal biceps tendon 02/09/2022   Pain in left elbow 01/28/2022   Chronic migraine without aura with status migrainosus, not intractable 01/27/2022   Primary osteoarthritis of first carpometacarpal joint of right hand 09/02/2021   Pain in right knee 02/12/2020   Second degree Mobitz II AV block 04/30/2019   Syncope and collapse 10/10/2018   Heart block AV complete ([place]) 10/10/2018   Symptomatic bradycardia 10/10/2018   Syncope, vasovagal 09/20/2018   Neck mass 10/14/2016   Depression 05/20/2016   Hypertensive heart disease    Paroxysmal atrial fibrillation ([place])    Hyperlipidemia LDL goal <70 03/27/2014   Premature ventricular contraction 03/04/2014   Chest pain with high risk of acute coronary syndrome 06/28/2013   Morbid obesity ([place]) 06/28/2013   OSA (obstructive sleep apnea) 12/18/2012   Sleepiness 11/06/2012   Fatigue 11/06/2012   PALPITATIONS 06/02/2009   DYSLIPIDEMIA 09/05/2008   Essential hypertension 09/05/2008   CAD S/P percutaneous coronary angioplasty 09/05/2008   RBBB 09/05/2008   SUPRAVENTRICULAR TACHYCARDIA 09/05/2008   ALLERGIC RHINITIS 09/05/2008   GERD 09/05/2008   BACK PAIN 09/05/2008   ARRHYTHMIA, HX OF 09/05/2008   MIGRAINES, HX OF 09/05/2008   Dyslipidemia 09/05/2008    PCP: Dr Jenna Luo   REFERRING PROVIDER: Tiney Rouge PA   REFERRING DIAG: Left Distal Biceps Repair ( 2nd repair)   THERAPY DIAG:  Pain in left elbow  Muscle weakness (generalized)  Rationale for Evaluation and  Treatment: Rehabilitation  ONSET DATE: 03/04/2022  SUBJECTIVE:  SUBJECTIVE STATEMENT:  Felt good after last PT session. MD increased my DM medication. Having a really bad headache this morning, not a migraine.   PERTINENT HISTORY:   PAIN:  Are you having pain? Yes: NPRS scale: 0/10; no pain for the past month, just numbness   PRECAUTIONS: None  WEIGHT BEARING RESTRICTIONS: No  FALLS:  Has patient fallen in last 6 months? No  LIVING ENVIRONMENT: Nothing significant  OCCUPATION: Works part time at [employer] of [employer]   Hobbies: wood working and walking   PLOF: Independent  PATIENT GOALS:  To have full function of his dominant arm   NEXT MD VISIT: 12/27 with surgeon   OBJECTIVE:   DIAGNOSTIC FINDINGS:  Nothing post op   PATIENT SURVEYS:  FOTO    COGNITION: Overall cognitive status: Within functional limits for tasks assessed     SENSATION: WFL  POSTURE: No significant limitations   UPPER EXTREMITY ROM:   Active ROM Right eval Left eval  Shoulder flexion    Shoulder extension    Shoulder abduction    Shoulder adduction    Shoulder internal rotation    Shoulder external rotation    Elbow flexion  Full   Elbow extension  Full   Wrist flexion    Wrist extension    Wrist ulnar deviation    Wrist radial deviation    Wrist pronation    Wrist supination    (Blank rows = not tested) UPPER EXTREMITY MMT:  MMT Right eval Left eval  Shoulder flexion 5/5 4/5  Shoulder extension    Shoulder abduction    Shoulder adduction    Shoulder internal rotation    Shoulder external rotation    Middle trapezius    Lower trapezius    Elbow flexion  Not taken 2nd to recent surgery   Elbow extension    Wrist flexion    Wrist extension    Wrist ulnar deviation    Wrist radial  deviation    Wrist pronation    Wrist supination    Grip strength (lbs)    (Blank rows = not tested)  PALPATION:  No unexpected tenderness to palpation     TODAY'S TREATMENT:                                                                                                                                         DATE:   05/18/22  TherEx  UBE L1 x3 min forward/3 min backward  Isometric bicep curls, tricep extension, supination, pronation 12x5 second holds each Bicep curls with 4#: neutral, pronated, and supinated grips x12 each  Shoulder flexion and ABD 2x10 2# standing Prone I, T, Y 2x12 each 0#  PNF 1 and 2 with 3# 1x10 each     05/11/22  TherEx  Wall pushups x10  Forearm planks on door 3x30 seconds Composite standing ROM from flexion with ER to extension  with IR and vice versa x15 each  Push ups on elevated mat table x10  Quadruped with elbows extended x20 lateral wt shifts Quadruped with UE flexion x20   Knee planks on forearms 3x30 seconds  Scapular retractions with red TB x12 with 5 second holds  Shoulder extensions with red TB x12 with 5 second holds Isometric (submax 50%)  biceps flexion and extension 2x10 each surgical UE  Submax isometric 50% elbow supination and pronation x10     PATIENT EDUCATION: Education details: progression of exercise/per protocol today  Person educated: Patient Education method: Explanation, Demonstration, Tactile cues, Verbal cues, and Handouts Education comprehension: verbalized understanding, returned demonstration, verbal cues required, tactile cues required, and needs further education  HOME EXERCISE PROGRAM: Access Code: [email or web address] URL: [email or web address] Date: 05/11/2022 Prepared by: Deniece Ree  Exercises - Seated Forearm Pronation and Supination with Hammer  - 1 x daily - 7 x weekly - 3 sets - 10 reps - Seated Elbow Flexion and Extension AROM  - 1 x daily - 7 x weekly - 3 sets - 10 reps - Shoulder  extension with resistance - Neutral  - 1 x daily - 7 x weekly - 3 sets - 10 reps - Scapular Retraction with Resistance  - 1 x daily - 7 x weekly - 3 sets - 10 reps - Standing Shoulder Flexion to 90 Degrees with Dumbbells  - 1 x daily - 7 x weekly - 3 sets - 10 reps - Wall Push Up  - 1 x daily - 7 x weekly - 3 sets - 10 reps - Quadruped Alternating Arm Lift  - 1 x daily - 7 x weekly - 3 sets - 10 reps  ASSESSMENT:  CLINICAL IMPRESSION:  Loukas arrives doing OK today, not feeling great due to a HA. At about 11 weeks post-op. Progressed per protocol as able/tolerated and moved from isometric training into more advanced strengthening as able and tolerated. Able to complete session on a pain free basis. Sounds like he has been progressing himself by using this UE for yardwork anyway but still not having any pain. Will continue efforts.   OBJECTIVE IMPAIRMENTS: decreased ROM, decreased strength, impaired UE functional use, and pain.   ACTIVITY LIMITATIONS: carrying, lifting, self feeding, and reach over head  PARTICIPATION LIMITATIONS: meal prep, cleaning, laundry, community activity, occupation, and yard work  PERSONAL FACTORS: 3+ comorbidities: CAD; DMII, OA of the right hand;  are also affecting patient's functional outcome.   REHAB POTENTIAL: Excellent  CLINICAL DECISION MAKING: Stable/uncomplicated  EVALUATION COMPLEXITY: Low  GOALS: Goals reviewed with patient? Yes  SHORT TERM GOALS: Target date: 12/12  Patient will increase shoulder flexion strength to 4+ out of 5 Baseline: Goal status: INITIAL  2.  Patient will have basic exercise strength and strengthening program Baseline:  Goal status: INITIAL  3.  Patient will reach overhead with 1 pound weight without an increased pain Baseline:  Goal status: INITIAL   LONG TERM GOALS: Target date:   Patient will return to woodworking without pain Baseline:  Goal status: INITIAL  2.  Patient will return to light gym program  without pain Baseline:  Goal status: INITIAL  3.  Patient will have full exercise program to promote shoulder and bicep strength Baseline:  Goal status: INITIAL  PLAN: PT FREQUENCY: 1x/week  PT DURATION: 8 weeks  PLANNED INTERVENTIONS: Therapeutic exercises, Therapeutic activity, Neuromuscular re-education, Patient/Family education, Self Care, Joint mobilization, Cryotherapy, Moist heat, Taping, and Manual therapy  PLAN FOR NEXT SESSION: per  protocol, update HEP and gym program   Sanyiah Kanzler U PT DPT PN2  05/18/2022, 9:30 AM

## 2022-05-26 ENCOUNTER — Encounter: Payer: Self-pay | Admitting: Orthopaedic Surgery

## 2022-05-26 ENCOUNTER — Ambulatory Visit (INDEPENDENT_AMBULATORY_CARE_PROVIDER_SITE_OTHER): Payer: Medicare Other | Admitting: Physician Assistant

## 2022-05-26 DIAGNOSIS — S46212D Strain of muscle, fascia and tendon of other parts of biceps, left arm, subsequent encounter: Secondary | ICD-10-CM

## 2022-05-26 NOTE — Progress Notes (Signed)
Post-Op Visit Note   Patient: Lee [name]           Date of Birth: 1951/02/15           MRN: [id number] Visit Date: 05/26/2022 PCP: Susy Frizzle, MD   Assessment & Plan:  Chief Complaint:  Chief Complaint  Patient presents with   Left Elbow - Follow-up    Distal biceps repair 03/03/2022   Visit Diagnoses:  1. Rupture of left distal biceps tendon, subsequent encounter     Plan: Patient is a pleasant 71 year old gentleman who comes in today 12 weeks status post left distal biceps repair 03/03/2022.  He has been doing well.  He has been in physical therapy making great progress.  He denies any pain but still notes decreased sensation to the volar forearm.  Examination of his left upper extremity reveals fully healed surgical scar without complication.  Full range of motion and strength of the elbow and biceps.  Slight decrease sensation to the volar forearm.  At this point, we will allow Emiel to progress with activity as tolerated.  We have discussed that the sensation may or may not return.  He will follow-up with [place] as needed.  Call with concerns or questions.  Follow-Up Instructions: Return if symptoms worsen or fail to improve.   Orders:  No orders of the defined types were placed in this encounter.  No orders of the defined types were placed in this encounter.   Imaging: No new imaging  PMFS History: Patient Active Problem List   Diagnosis Date Noted   Rupture of left distal biceps tendon 02/09/2022   Pain in left elbow 01/28/2022   Chronic migraine without aura with status migrainosus, not intractable 01/27/2022   Primary osteoarthritis of first carpometacarpal joint of right hand 09/02/2021   Pain in right knee 02/12/2020   Second degree Mobitz II AV block 04/30/2019   Syncope and collapse 10/10/2018   Heart block AV complete ([place]) 10/10/2018   Symptomatic bradycardia 10/10/2018   Syncope, vasovagal 09/20/2018   Neck mass 10/14/2016   Depression  05/20/2016   Hypertensive heart disease    Paroxysmal atrial fibrillation ([place])    Hyperlipidemia LDL goal <70 03/27/2014   Premature ventricular contraction 03/04/2014   Chest pain with high risk of acute coronary syndrome 06/28/2013   Morbid obesity ([place]) 06/28/2013   OSA (obstructive sleep apnea) 12/18/2012   Sleepiness 11/06/2012   Fatigue 11/06/2012   PALPITATIONS 06/02/2009   DYSLIPIDEMIA 09/05/2008   Essential hypertension 09/05/2008   CAD S/P percutaneous coronary angioplasty 09/05/2008   RBBB 09/05/2008   SUPRAVENTRICULAR TACHYCARDIA 09/05/2008   ALLERGIC RHINITIS 09/05/2008   GERD 09/05/2008   BACK PAIN 09/05/2008   ARRHYTHMIA, HX OF 09/05/2008   MIGRAINES, HX OF 09/05/2008   Dyslipidemia 09/05/2008   Past Medical History:  Diagnosis Date   Allergic rhinitis    chronic sinusitis   Arthritis    osetoarthritis   CAD (coronary artery disease)    a. 2009 PCI/DES to mLAD; b. 05/2010 s/p DES RCA and LAD.; c. 05/2013 Cath: LAD mild ISR, LCX nl, RCA 40p, patent stents.   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   Cataract    has had lasik surg   COPD (chronic obstructive pulmonary disease) ([place])    Depression    Diabetes mellitus type 2, controlled, without complications ([place])    Diabetes mellitus without complication ([place])    Phreesia 08/22/2020   Diverticulosis  DJD (degenerative joint disease)    Esophagitis 1991   grade 1   GERD (gastroesophageal reflux disease)    Hiatal hernia   Hearing loss    wears bilateral hearing aids   Heart murmur    Dr Dennard Schaumann dx per patient   Hemorrhoids    Hyperlipidemia    Hypertension    Hypertensive heart disease    Iron deficiency anemia    Migraines    Myocardial infarction [facility])    Paroxysmal atrial fibrillation ([place])    a. s/p afib ablation 10/22/08 (J. Allred).   PVC's (premature ventricular contractions)    Sleep apnea    Uses nightly.  Questionalble: RDI during the total sleep time 6h 28 mins  was 3.55/hr during REM sleep was at 5.71/hr. Supine AHI was 5.93/hr.   Syncope 08/2018    Family History  Problem Relation Age of Onset   Colon cancer Mother        mets from uterine   Uterine cancer Mother    Heart disease Father        and sister   Diabetes Father    Hearing loss Father    Stroke Father    Heart disease Sister    Lung cancer Sister    Stroke Brother     Past Surgical History:  Procedure Laterality Date   ABDOMINAL HYSTERECTOMY     CARDIAC CATHETERIZATION  05/31/2010   The proximal LAD was then predilated with 2.0 x 12 trek. this was then stented with a 2.5 x 16 promus Element drug-eluting stent at 14 atmosphere and prostdilated with 2.75 x 12 [place] trek at 16 atmospheres(2.8 mm) resulting the reduction of 80% proximal LAD stenosis to 0% residual with excellent flow.   CARDIAC CATHETERIZATION  06/05/2010   Successful percutaneous coronary intervention to the right coronary artery with percutaneous transluminal coronary angioplasty/stenting and insertion 3.0 x 16 mm Promus DES post dilated to 3.35 mm with stenosis being reduced to 0%   CARDIAC CATHETERIZATION  02/29/2008   Post dilatation was performed using a 2.75 x 9 [place] sprinter, 10 atmospheres for 40 seconds and then 9 atmosphere for 35 sec. This resulted in the 80% area of narrowing pre-intervention, now appearing to normal. There was no edvidence of the dissection or thrombus and there was TIMI III flow pre and post.   CARDIAC CATHETERIZATION  02/28/2006   CORONARY ANGIOPLASTY WITH STENT PLACEMENT     3 stents/ 4 caths   DISTAL BICEPS TENDON REPAIR Left 02/12/2022   Procedure: LEFT DISTAL BICEPS TENDON REPAIR;  Surgeon: Leandrew Koyanagi, MD;  Location: [place];  Service: Orthopedics;  Laterality: Left;   DISTAL BICEPS TENDON REPAIR Left 03/03/2022   Procedure: LEFT DISTAL BICEPS TENDON REPAIR;  Surgeon: Leandrew Koyanagi, MD;  Location: [place];  Service: Orthopedics;  Laterality: Left;   EYE SURGERY     FINGER ARTHROPLASTY  Right 09/02/2021   Procedure: RIGHT THUMB CARPOMETACARPAL ARTHROPLASTY;  Surgeon: Leandrew Koyanagi, MD;  Location: [place];  Service: Orthopedics;  Laterality: Right;  block in preop   KNEE ARTHROSCOPY     right   LASIK     LEFT HEART CATHETERIZATION WITH CORONARY ANGIOGRAM N/A 06/29/2013   Procedure: LEFT HEART CATHETERIZATION WITH CORONARY ANGIOGRAM;  Surgeon: Leonie Man, MD;  Location: [facility] CATH LAB;  Service: Cardiovascular;  Laterality: N/A;   NASAL SINUS SURGERY  06/01/1999   NECK SURGERY     Cervical fusion   PACEMAKER IMPLANT N/A 05/01/2019  Procedure: PACEMAKER IMPLANT;  Surgeon: [name], MD;  Location: [place] CV LAB;  Service: Cardiovascular;  Laterality: N/A;   RADIOFREQUENCY ABLATION  09/28/2008   atrial fibrillation   ROTATOR CUFF REPAIR     left   SHOULDER OPEN ROTATOR CUFF REPAIR     right   SPINE SURGERY     WRIST ARTHROCENTESIS     left   Social History   Occupational History   Occupation: retired  Tobacco Use   Smoking status: Former    Packs/day: 2.00    Years: 25.00    Total pack years: 50.00    Types: Cigarettes, Cigars    Quit date: 04/22/1988    Years since quitting: 34.1   Smokeless tobacco: Never  Vaping Use   Vaping Use: Never used  Substance and Sexual Activity   Alcohol use: Not Currently   Drug use: No   Sexual activity: Not Currently    Birth control/protection: Surgical    Comment: vas

## 2022-05-28 LAB — CUP PACEART REMOTE DEVICE CHECK
Battery Remaining Longevity: 68 mo
Battery Remaining Percentage: 69 %
Battery Voltage: 2.99 V
Brady Statistic AP VP Percent: 1 %
Brady Statistic AP VS Percent: 1 %
Brady Statistic AS VP Percent: 99 %
Brady Statistic AS VS Percent: 1 %
Brady Statistic RA Percent Paced: 1 %
Brady Statistic RV Percent Paced: 99 %
Date Time Interrogation Session: 20231229020014
Implantable Lead Connection Status: 753985
Implantable Lead Connection Status: 753985
Implantable Lead Implant Date: 20201201
Implantable Lead Implant Date: 20201201
Implantable Lead Location: 753859
Implantable Lead Location: 753860
Implantable Pulse Generator Implant Date: 20201201
Lead Channel Impedance Value: 460 Ohm
Lead Channel Impedance Value: 480 Ohm
Lead Channel Pacing Threshold Amplitude: 0.75 V
Lead Channel Pacing Threshold Amplitude: 1 V
Lead Channel Pacing Threshold Pulse Width: 0.5 ms
Lead Channel Pacing Threshold Pulse Width: 0.5 ms
Lead Channel Sensing Intrinsic Amplitude: 2.4 mV
Lead Channel Sensing Intrinsic Amplitude: 6.6 mV
Lead Channel Setting Pacing Amplitude: 2 V
Lead Channel Setting Pacing Amplitude: 2.5 V
Lead Channel Setting Pacing Pulse Width: 0.5 ms
Lead Channel Setting Sensing Sensitivity: 2 mV
Pulse Gen Model: 2272
Pulse Gen Serial Number: 9182765

## 2022-06-01 ENCOUNTER — Encounter (HOSPITAL_BASED_OUTPATIENT_CLINIC_OR_DEPARTMENT_OTHER): Payer: Self-pay | Admitting: Physical Therapy

## 2022-06-01 ENCOUNTER — Ambulatory Visit (HOSPITAL_BASED_OUTPATIENT_CLINIC_OR_DEPARTMENT_OTHER): Payer: Medicare Other | Attending: Physician Assistant | Admitting: Physical Therapy

## 2022-06-01 DIAGNOSIS — M25522 Pain in left elbow: Secondary | ICD-10-CM | POA: Insufficient documentation

## 2022-06-01 DIAGNOSIS — M6281 Muscle weakness (generalized): Secondary | ICD-10-CM | POA: Insufficient documentation

## 2022-06-01 NOTE — Therapy (Addendum)
OUTPATIENT PHYSICAL THERAPY UPPER EXTREMITY TREATMENT/discharge   Patient Name: Lee [name] MRN: [id number] DOB:05/03/1951, 72 y.o., male Today's Date: 06/01/2022  END OF SESSION:  PT End of Session - 06/01/22 0853     Visit Number 4    Number of Visits 8    Date for PT Re-Evaluation 06/15/22    PT Start Time 0848    PT Stop Time 0927    PT Time Calculation (min) 39 min    Activity Tolerance Patient tolerated treatment well    Behavior During Therapy [facility] for tasks assessed/performed                Past Medical History:  Diagnosis Date   Allergic rhinitis    chronic sinusitis   Arthritis    osetoarthritis   CAD (coronary artery disease)    a. 2009 PCI/DES to mLAD; b. 05/2010 s/p DES RCA and LAD.; c. 05/2013 Cath: LAD mild ISR, LCX nl, RCA 40p, patent stents.   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   Cataract    has had lasik surg   COPD (chronic obstructive pulmonary disease) ([place])    Depression    Diabetes mellitus type 2, controlled, without complications ([place])    Diabetes mellitus without complication ([place])    Phreesia 08/22/2020   Diverticulosis    DJD (degenerative joint disease)    Esophagitis 1991   grade 1   GERD (gastroesophageal reflux disease)    Hiatal hernia   Hearing loss    wears bilateral hearing aids   Heart murmur    Dr Dennard Schaumann dx per patient   Hemorrhoids    Hyperlipidemia    Hypertension    Hypertensive heart disease    Iron deficiency anemia    Migraines    Myocardial infarction [facility])    Paroxysmal atrial fibrillation ([place])    a. s/p afib ablation 10/22/08 (J. Allred).   PVC's (premature ventricular contractions)    Sleep apnea    Uses nightly.  Questionalble: RDI during the total sleep time 6h 28 mins was 3.55/hr during REM sleep was at 5.71/hr. Supine AHI was 5.93/hr.   Syncope 08/2018   Past Surgical History:  Procedure Laterality Date   ABDOMINAL HYSTERECTOMY     CARDIAC CATHETERIZATION  05/31/2010    The proximal LAD was then predilated with 2.0 x 12 trek. this was then stented with a 2.5 x 16 promus Element drug-eluting stent at 14 atmosphere and prostdilated with 2.75 x 12 [place] trek at 16 atmospheres(2.8 mm) resulting the reduction of 80% proximal LAD stenosis to 0% residual with excellent flow.   CARDIAC CATHETERIZATION  06/05/2010   Successful percutaneous coronary intervention to the right coronary artery with percutaneous transluminal coronary angioplasty/stenting and insertion 3.0 x 16 mm Promus DES post dilated to 3.35 mm with stenosis being reduced to 0%   CARDIAC CATHETERIZATION  02/29/2008   Post dilatation was performed using a 2.75 x 9 [place] sprinter, 10 atmospheres for 40 seconds and then 9 atmosphere for 35 sec. This resulted in the 80% area of narrowing pre-intervention, now appearing to normal. There was no edvidence of the dissection or thrombus and there was TIMI III flow pre and post.   CARDIAC CATHETERIZATION  02/28/2006   CORONARY ANGIOPLASTY WITH STENT PLACEMENT     3 stents/ 4 caths   DISTAL BICEPS TENDON REPAIR Left 02/12/2022   Procedure: LEFT DISTAL BICEPS TENDON REPAIR;  Surgeon: Leandrew Koyanagi, MD;  [facility]  OR;  Service: Orthopedics;  Laterality: Left;   DISTAL BICEPS TENDON REPAIR Left 03/03/2022   Procedure: LEFT DISTAL BICEPS TENDON REPAIR;  Surgeon: Leandrew Koyanagi, MD;  Location: [place];  Service: Orthopedics;  Laterality: Left;   EYE SURGERY     FINGER ARTHROPLASTY Right 09/02/2021   Procedure: RIGHT THUMB CARPOMETACARPAL ARTHROPLASTY;  Surgeon: Leandrew Koyanagi, MD;  Location: [place];  Service: Orthopedics;  Laterality: Right;  block in preop   KNEE ARTHROSCOPY     right   LASIK     LEFT HEART CATHETERIZATION WITH CORONARY ANGIOGRAM N/A 06/29/2013   Procedure: LEFT HEART CATHETERIZATION WITH CORONARY ANGIOGRAM;  Surgeon: Leonie Man, MD;  Location: [facility] CATH LAB;  Service: Cardiovascular;  Laterality: N/A;   NASAL SINUS SURGERY  06/01/1999    NECK SURGERY     Cervical fusion   PACEMAKER IMPLANT N/A 05/01/2019   Procedure: PACEMAKER IMPLANT;  Surgeon: [name], MD;  Location: [place] CV LAB;  Service: Cardiovascular;  Laterality: N/A;   RADIOFREQUENCY ABLATION  09/28/2008   atrial fibrillation   ROTATOR CUFF REPAIR     left   SHOULDER OPEN ROTATOR CUFF REPAIR     right   SPINE SURGERY     WRIST ARTHROCENTESIS     left   Patient Active Problem List   Diagnosis Date Noted   Rupture of left distal biceps tendon 02/09/2022   Pain in left elbow 01/28/2022   Chronic migraine without aura with status migrainosus, not intractable 01/27/2022   Primary osteoarthritis of first carpometacarpal joint of right hand 09/02/2021   Pain in right knee 02/12/2020   Second degree Mobitz II AV block 04/30/2019   Syncope and collapse 10/10/2018   Heart block AV complete ([place]) 10/10/2018   Symptomatic bradycardia 10/10/2018   Syncope, vasovagal 09/20/2018   Neck mass 10/14/2016   Depression 05/20/2016   Hypertensive heart disease    Paroxysmal atrial fibrillation ([place])    Hyperlipidemia LDL goal <70 03/27/2014   Premature ventricular contraction 03/04/2014   Chest pain with high risk of acute coronary syndrome 06/28/2013   Morbid obesity ([place]) 06/28/2013   OSA (obstructive sleep apnea) 12/18/2012   Sleepiness 11/06/2012   Fatigue 11/06/2012   PALPITATIONS 06/02/2009   DYSLIPIDEMIA 09/05/2008   Essential hypertension 09/05/2008   CAD S/P percutaneous coronary angioplasty 09/05/2008   RBBB 09/05/2008   SUPRAVENTRICULAR TACHYCARDIA 09/05/2008   ALLERGIC RHINITIS 09/05/2008   GERD 09/05/2008   BACK PAIN 09/05/2008   ARRHYTHMIA, HX OF 09/05/2008   MIGRAINES, HX OF 09/05/2008   Dyslipidemia 09/05/2008    PCP: Dr Jenna Luo   REFERRING PROVIDER: Tiney Rouge PA   REFERRING DIAG: Left Distal Biceps Repair ( 2nd repair)   THERAPY DIAG:  Pain in left elbow  Muscle weakness (generalized)  Rationale for Evaluation  and Treatment: Rehabilitation  ONSET DATE: 03/04/2022  SUBJECTIVE:  SUBJECTIVE STATEMENT:  Felt good after last PT session. Not having any problems. Went to Dr. Erlinda Hong the 27th, he released me from his care as everything is looking good. I don't do my HEP at home I just do my regular things I think that gives me enough exercise anyway.   PERTINENT HISTORY:   PAIN:  Are you having pain? Yes: NPRS scale: 0/10; no pain for the past month, just numbness   PRECAUTIONS: None MD note 12/27- activity as tolerated no restrictions now   WEIGHT BEARING RESTRICTIONS: No  FALLS:  Has patient fallen in last 6 months? No  LIVING ENVIRONMENT: Nothing significant  OCCUPATION: Works part time at [employer] of [employer]   Hobbies: wood working and walking   PLOF: Independent  PATIENT GOALS:  To have full function of his dominant arm   NEXT MD VISIT: 12/27 with surgeon   OBJECTIVE:   DIAGNOSTIC FINDINGS:  Nothing post op   PATIENT SURVEYS:  FOTO    COGNITION: Overall cognitive status: Within functional limits for tasks assessed     SENSATION: WFL  POSTURE: No significant limitations   UPPER EXTREMITY ROM:   Active ROM Right eval Left eval  Shoulder flexion    Shoulder extension    Shoulder abduction    Shoulder adduction    Shoulder internal rotation    Shoulder external rotation    Elbow flexion  Full   Elbow extension  Full   Wrist flexion    Wrist extension    Wrist ulnar deviation    Wrist radial deviation    Wrist pronation    Wrist supination    (Blank rows = not tested) UPPER EXTREMITY MMT:  MMT Right eval Left eval  Shoulder flexion 5/5 4/5  Shoulder extension    Shoulder abduction    Shoulder adduction    Shoulder internal rotation    Shoulder external rotation    Middle  trapezius    Lower trapezius    Elbow flexion  Not taken 2nd to recent surgery   Elbow extension    Wrist flexion    Wrist extension    Wrist ulnar deviation    Wrist radial deviation    Wrist pronation    Wrist supination    Grip strength (lbs)    (Blank rows = not tested)  PALPATION:  No unexpected tenderness to palpation     TODAY'S TREATMENT:                                                                                                                                         DATE:   06/01/22  TherEx  UBE L2.5 x3 min forward/3 min backwards  3 way bicep curl 5# 2x15 each direction Modified skull crushers seated for tricep 2x12 4#  Shoulder flexion 2x12 5# Shoulder ABD 2x10 3# Diamond pushups 2x15  Single UE wall pushups 2x10 self selected  depth surgical LE painfree range     05/18/22  TherEx  UBE L1 x3 min forward/3 min backward  Isometric bicep curls, tricep extension, supination, pronation 12x5 second holds each Bicep curls with 4#: neutral, pronated, and supinated grips x12 each  Shoulder flexion and ABD 2x10 2# standing Prone I, T, Y 2x12 each 0#  PNF 1 and 2 with 3# 1x10 each     05/11/22  TherEx  Wall pushups x10  Forearm planks on door 3x30 seconds Composite standing ROM from flexion with ER to extension with IR and vice versa x15 each  Push ups on elevated mat table x10  Quadruped with elbows extended x20 lateral wt shifts Quadruped with UE flexion x20   Knee planks on forearms 3x30 seconds  Scapular retractions with red TB x12 with 5 second holds  Shoulder extensions with red TB x12 with 5 second holds Isometric (submax 50%)  biceps flexion and extension 2x10 each surgical UE  Submax isometric 50% elbow supination and pronation x10     PATIENT EDUCATION: Education details: now able to do activity as tolerated per MD note, POC and possible DC next appt  Person educated: Patient Education method: Explanation, Demonstration, Tactile cues,  Verbal cues, and Handouts Education comprehension: verbalized understanding, returned demonstration, verbal cues required, tactile cues required, and needs further education  HOME EXERCISE PROGRAM: Access Code: [email or web address] URL: [email or web address] Date: 05/11/2022 Prepared by: Deniece Ree  Exercises - Seated Forearm Pronation and Supination with Hammer  - 1 x daily - 7 x weekly - 3 sets - 10 reps - Seated Elbow Flexion and Extension AROM  - 1 x daily - 7 x weekly - 3 sets - 10 reps - Shoulder extension with resistance - Neutral  - 1 x daily - 7 x weekly - 3 sets - 10 reps - Scapular Retraction with Resistance  - 1 x daily - 7 x weekly - 3 sets - 10 reps - Standing Shoulder Flexion to 90 Degrees with Dumbbells  - 1 x daily - 7 x weekly - 3 sets - 10 reps - Wall Push Up  - 1 x daily - 7 x weekly - 3 sets - 10 reps - Quadruped Alternating Arm Lift  - 1 x daily - 7 x weekly - 3 sets - 10 reps  ASSESSMENT:  CLINICAL IMPRESSION:  [place] arrives doing very well, MD cleared him from all restrictions and also cleared him from care. Continued working on general elbow and shoulder strengthening. Wanted to update HEP but did not as he reports he is not compliant, did educate on benefits of specific strengthening. May be ready for DC next session.   OBJECTIVE IMPAIRMENTS: decreased ROM, decreased strength, impaired UE functional use, and pain.   ACTIVITY LIMITATIONS: carrying, lifting, self feeding, and reach over head  PARTICIPATION LIMITATIONS: meal prep, cleaning, laundry, community activity, occupation, and yard work  PERSONAL FACTORS: 3+ comorbidities: CAD; DMII, OA of the right hand;  are also affecting patient's functional outcome.   REHAB POTENTIAL: Excellent  CLINICAL DECISION MAKING: Stable/uncomplicated  EVALUATION COMPLEXITY: Low  GOALS: Goals reviewed with patient? Yes  SHORT TERM GOALS: Target date: 12/12  Patient will increase shoulder flexion strength to 4+  out of 5 Baseline: Goal status: INITIAL  2.  Patient will have basic exercise strength and strengthening program Baseline:  Goal status: INITIAL  3.  Patient will reach overhead with 1 pound weight without an increased pain Baseline:  Goal status: INITIAL   LONG TERM GOALS:  Target date:   Patient will return to woodworking without pain Baseline:  Goal status: INITIAL  2.  Patient will return to light gym program without pain Baseline:  Goal status: INITIAL  3.  Patient will have full exercise program to promote shoulder and bicep strength Baseline:  Goal status: INITIAL  PLAN: PT FREQUENCY: 1x/week  PT DURATION: 8 weeks  PLANNED INTERVENTIONS: Therapeutic exercises, Therapeutic activity, Neuromuscular re-education, Patient/Family education, Self Care, Joint mobilization, Cryotherapy, Moist heat, Taping, and Manual therapy  PLAN FOR NEXT SESSION: activity as tolerated per MD. Possible DC next session, review gym machines  PHYSICAL THERAPY DISCHARGE SUMMARY  Visits from Start of Care: 4  Current functional level related to goals / functional outcomes: Significant improvement in function    Remaining deficits: None    Education / Equipment:    Patient agrees to discharge. Patient goals were met. Patient is being discharged due to meeting the stated rehab goals.   Discharge performed by Carolyne Littles PT   Ann Lions PT DPT PN2  06/01/2022, 9:28 AM

## 2022-06-07 ENCOUNTER — Encounter: Payer: Self-pay | Admitting: Family Medicine

## 2022-06-08 ENCOUNTER — Encounter (HOSPITAL_BASED_OUTPATIENT_CLINIC_OR_DEPARTMENT_OTHER): Payer: Self-pay

## 2022-06-08 ENCOUNTER — Encounter (HOSPITAL_BASED_OUTPATIENT_CLINIC_OR_DEPARTMENT_OTHER): Payer: Medicare Other | Admitting: Physical Therapy

## 2022-06-08 ENCOUNTER — Ambulatory Visit: Payer: Medicare Other | Admitting: Family Medicine

## 2022-06-08 ENCOUNTER — Other Ambulatory Visit: Payer: Self-pay | Admitting: Family Medicine

## 2022-06-08 ENCOUNTER — Telehealth: Payer: Self-pay | Admitting: Family Medicine

## 2022-06-08 MED ORDER — NIRMATRELVIR/RITONAVIR (PAXLOVID)TABLET: 3.0000 | ORAL_TABLET | Freq: Two times a day (BID) | ORAL | 0 refills | Status: AC

## 2022-06-08 NOTE — Telephone Encounter (Signed)
Patient called to report a positive COVID test taken last night. Sx: congestion, sore throat, cough, and body aches. Patient has been in quarantine since Saturday. Requesting for medication to be called into pharmacy to manage sx. Also wants to know how long he should be in quarantine.   Pharmacy confirmed as:  [facility] [place], [place] [address], [place] [address] Phone: [phone]  Fax: [phone] DEA #: [id number]   Please advise at [phone].

## 2022-06-18 ENCOUNTER — Encounter: Payer: Self-pay | Admitting: Family Medicine

## 2022-06-22 ENCOUNTER — Other Ambulatory Visit: Payer: Medicare Other

## 2022-06-22 DIAGNOSIS — Z125 Encounter for screening for malignant neoplasm of prostate: Secondary | ICD-10-CM

## 2022-06-22 DIAGNOSIS — E118 Type 2 diabetes mellitus with unspecified complications: Secondary | ICD-10-CM

## 2022-06-22 DIAGNOSIS — I2510 Atherosclerotic heart disease of native coronary artery without angina pectoris: Secondary | ICD-10-CM

## 2022-06-22 DIAGNOSIS — I10 Essential (primary) hypertension: Secondary | ICD-10-CM

## 2022-06-22 DIAGNOSIS — E785 Hyperlipidemia, unspecified: Secondary | ICD-10-CM

## 2022-06-23 LAB — COMPLETE METABOLIC PANEL WITH GFR
AG Ratio: 2.1 (calc) (ref 1.0–2.5)
ALT: 14 U/L (ref 9–46)
AST: 12 U/L (ref 10–35)
Albumin: 4.5 g/dL (ref 3.6–5.1)
Alkaline phosphatase (APISO): 60 U/L (ref 35–144)
BUN: 15 mg/dL (ref 7–25)
CO2: 27 mmol/L (ref 20–32)
Calcium: 9.3 mg/dL (ref 8.6–10.3)
Chloride: 102 mmol/L (ref 98–110)
Creat: 0.75 mg/dL (ref 0.70–1.28)
Globulin: 2.1 g/dL (calc) (ref 1.9–3.7)
Glucose, Bld: 98 mg/dL (ref 65–99)
Potassium: 4.7 mmol/L (ref 3.5–5.3)
Sodium: 138 mmol/L (ref 135–146)
Total Bilirubin: 0.4 mg/dL (ref 0.2–1.2)
Total Protein: 6.6 g/dL (ref 6.1–8.1)
eGFR: 96 mL/min/{1.73_m2} (ref >=60)

## 2022-06-23 LAB — CBC WITH DIFFERENTIAL/PLATELET
Absolute Monocytes: 518 cells/uL (ref 200–950)
Basophils Absolute: 51 cells/uL (ref 0–200)
Basophils Relative: 0.8 %
Eosinophils Absolute: 218 cells/uL (ref 15–500)
Eosinophils Relative: 3.4 %
HCT: 45.2 % (ref 38.5–50.0)
Hemoglobin: 15.2 g/dL (ref 13.2–17.1)
Lymphs Abs: 1882 cells/uL (ref 850–3900)
MCH: 29.6 pg (ref 27.0–33.0)
MCHC: 33.6 g/dL (ref 32.0–36.0)
MCV: 88.1 fL (ref 80.0–100.0)
MPV: 9.3 fL (ref 7.5–12.5)
Monocytes Relative: 8.1 %
Neutro Abs: 3731 cells/uL (ref 1500–7800)
Neutrophils Relative %: 58.3 %
Platelets: 264 10*3/uL (ref 140–400)
RBC: 5.13 10*6/uL (ref 4.20–5.80)
RDW: 12 % (ref 11.0–15.0)
Total Lymphocyte: 29.4 %
WBC: 6.4 10*3/uL (ref 3.8–10.8)

## 2022-06-23 LAB — LIPID PANEL
Cholesterol: 115 mg/dL (ref <200)
HDL: 56 mg/dL (ref >=40)
LDL Cholesterol (Calc): 44 mg/dL (calc)
Non-HDL Cholesterol (Calc): 59 mg/dL (calc) (ref <130)
Total CHOL/HDL Ratio: 2.1 (calc) (ref <5.0)
Triglycerides: 69 mg/dL (ref <150)

## 2022-06-23 LAB — HEMOGLOBIN A1C
Hgb A1c MFr Bld: 6.2 % of total Hgb — ABNORMAL HIGH (ref <5.7)
Mean Plasma Glucose: 131 mg/dL
eAG (mmol/L): 7.3 mmol/L

## 2022-06-23 LAB — PSA: PSA: 0.91 ng/mL (ref <=4.00)

## 2022-06-23 LAB — VITAMIN B12: Vitamin B-12: 566 pg/mL (ref 200–1100)

## 2022-06-25 ENCOUNTER — Ambulatory Visit (INDEPENDENT_AMBULATORY_CARE_PROVIDER_SITE_OTHER): Payer: Medicare Other | Admitting: Family Medicine

## 2022-06-25 ENCOUNTER — Encounter: Payer: Self-pay | Admitting: Family Medicine

## 2022-06-25 VITALS — BP 120/72 | HR 94 | Temp 97.9°F | Ht 68.0 in | Wt 212.4 lb

## 2022-06-25 DIAGNOSIS — R002 Palpitations: Secondary | ICD-10-CM

## 2022-06-25 DIAGNOSIS — E118 Type 2 diabetes mellitus with unspecified complications: Secondary | ICD-10-CM | POA: Diagnosis not present

## 2022-06-25 DIAGNOSIS — I2510 Atherosclerotic heart disease of native coronary artery without angina pectoris: Secondary | ICD-10-CM

## 2022-06-25 DIAGNOSIS — Z0000 Encounter for general adult medical examination without abnormal findings: Secondary | ICD-10-CM

## 2022-06-25 DIAGNOSIS — Z0001 Encounter for general adult medical examination with abnormal findings: Secondary | ICD-10-CM

## 2022-06-25 DIAGNOSIS — Z1211 Encounter for screening for malignant neoplasm of colon: Secondary | ICD-10-CM

## 2022-06-25 MED ORDER — OXYCODONE-ACETAMINOPHEN 10-325 MG PO TABS: 1.0000 | ORAL_TABLET | Freq: Three times a day (TID) | ORAL | 0 refills | Status: DC | PRN

## 2022-06-25 NOTE — Progress Notes (Signed)
Subjective:    Patient ID: Lee [name], male    DOB: 1951/02/21, 72 y.o.   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  However recently he has been having episodes of tachycardia.  He states that his heart rate will suddenly shoot up to 120 and stay there for 30 to 60 seconds and then extinguish on its own.  He denies any irregular heartbeat but he does report tachycardia.  He does have a history of SVT.  He has not had a cardiac monitor recently.  He denies any syncope or near syncope.  He denies any chest pain.  His last colonoscopy was 2014.  He is due for prostate cancer screening with a PSA.  His immunizations are up-to-date.  His most recent lab work is listed below: Lab on 06/22/2022  Component Date Value Ref Range Status   WBC 06/22/2022 6.4  3.8 - 10.8 Thousand/uL Final   RBC 06/22/2022 5.13  4.20 - 5.80 Million/uL Final   Hemoglobin 06/22/2022 15.2  13.2 - 17.1 g/dL Final   HCT 06/22/2022 45.2  38.5 - 50.0 % Final   MCV 06/22/2022 88.1  80.0 - 100.0 fL Final   MCH 06/22/2022 29.6  27.0 - 33.0 pg Final   MCHC 06/22/2022 33.6  32.0 - 36.0 g/dL Final   RDW 06/22/2022 12.0  11.0 - 15.0 % Final   Platelets 06/22/2022 264  140 - 400 Thousand/uL Final   MPV 06/22/2022 9.3  7.5 - 12.5 fL Final   Neutro Abs 06/22/2022 3,731  1,500 - 7,800 cells/uL Final   Lymphs Abs 06/22/2022 1,882  850 - 3,900 cells/uL Final   Absolute Monocytes 06/22/2022 518  200 - 950 cells/uL Final   Eosinophils Absolute 06/22/2022 218  15 - 500 cells/uL Final   Basophils Absolute 06/22/2022 51  0 - 200 cells/uL Final   Neutrophils Relative % 06/22/2022 58.3  % Final   Total Lymphocyte 06/22/2022 29.4  % Final   Monocytes Relative 06/22/2022 8.1  % Final   Eosinophils Relative 06/22/2022 3.4  % Final   Basophils Relative 06/22/2022 0.8  % Final   Glucose, Bld 06/22/2022 98  65 - 99 mg/dL Final    Comment: .            Fasting reference interval .    BUN 06/22/2022 15  7 - 25 mg/dL Final   Creat 06/22/2022 0.75  0.70 - 1.28 mg/dL Final   eGFR 06/22/2022 96  > OR = 60 mL/min/1.10m Final   BUN/Creatinine Ratio 06/22/2022 SEE NOTE:  6 - 22 (calc) Final   Comment:    Not Reported: BUN and Creatinine are within    reference range. .    Sodium 06/22/2022 138  135 - 146 mmol/L Final   Potassium 06/22/2022 4.7  3.5 - 5.3 mmol/L Final   Chloride 06/22/2022 102  98 - 110 mmol/L Final   CO2 06/22/2022 27  20 - 32 mmol/L Final   Calcium 06/22/2022 9.3  8.6 - 10.3 mg/dL Final   Total Protein 06/22/2022 6.6  6.1 - 8.1 g/dL Final   Albumin 06/22/2022 4.5  3.6 - 5.1 g/dL Final   Globulin 06/22/2022 2.1  1.9 - 3.7 g/dL (calc) Final   AG Ratio 06/22/2022 2.1  1.0 - 2.5 (calc) Final   Total Bilirubin 06/22/2022 0.4  0.2 - 1.2 mg/dL Final   Alkaline phosphatase (  APISO) 06/22/2022 60  35 - 144 U/L Final   AST 06/22/2022 12  10 - 35 U/L Final   ALT 06/22/2022 14  9 - 46 U/L Final   Hgb A1c MFr Bld 06/22/2022 6.2 (H)  <5.7 % of total Hgb Final   Comment: For someone without known diabetes, a hemoglobin  A1c value between 5.7% and 6.4% is consistent with prediabetes and should be confirmed with a  follow-up test. . For someone with known diabetes, a value <7% indicates that their diabetes is well controlled. A1c targets should be individualized based on duration of diabetes, age, comorbid conditions, and other considerations. . This assay result is consistent with an increased risk of diabetes. . Currently, no consensus exists regarding use of hemoglobin A1c for diagnosis of diabetes for children. .    Mean Plasma Glucose 06/22/2022 131  mg/dL Final   eAG (mmol/L) 06/22/2022 7.3  mmol/L Final   Comment: . HbA1c performed on Roche platform. Effective 03/08/22 a change in test platforms may have  shifted HbA1c results compared to historical results.    Cholesterol 06/22/2022 115  <200  mg/dL Final   HDL 06/22/2022 56  > OR = 40 mg/dL Final   Triglycerides 06/22/2022 69  <150 mg/dL Final   LDL Cholesterol (Calc) 06/22/2022 44  mg/dL (calc) Final   Comment: Reference range: <100 . Desirable range <100 mg/dL for primary prevention;   <70 mg/dL for patients with CHD or diabetic patients  with > or = 2 CHD risk factors. Marland Kitchen LDL-C is now calculated using the Martin-Hopkins  calculation, which is a validated novel method providing  better accuracy than the Friedewald equation in the  estimation of LDL-C.  Cresenciano Genre et al. Annamaria Helling. 0865;784(69): 2061-2068  (http://education.[facility].com/faq/FAQ164)    Total CHOL/HDL Ratio 06/22/2022 2.1  <5.0 (calc) Final   Non-HDL Cholesterol (Calc) 06/22/2022 59  <130 mg/dL (calc) Final   Comment: For patients with diabetes plus 1 major ASCVD risk  factor, treating to a non-HDL-C goal of <100 mg/dL  (LDL-C of <70 mg/dL) is considered a therapeutic  option.    Vitamin B-12 06/22/2022 566  200 - 1,100 pg/mL Final   PSA 06/22/2022 0.91  < OR = 4.00 ng/mL Final   Comment: The total PSA value from this assay system is  standardized against the WHO standard. The test  result will be approximately 20% lower when compared  to the equimolar-standardized total PSA (Beckman  Coulter). Comparison of serial PSA results should be  interpreted with this fact in mind. . This test was performed using the Siemens  chemiluminescent method. Values obtained from  different assay methods cannot be used interchangeably. PSA levels, regardless of value, should not be interpreted as absolute evidence of the presence or absence of disease.   Abstract on 06/18/2022  Component Date Value Ref Range Status   HM Diabetic Eye Exam 03/24/2022 No Retinopathy  No Retinopathy Final  Appointment on 05/28/2022  Component Date Value Ref Range Status   Date Time Interrogation Session 05/28/2022 [id number]   Final   Pulse Generator Manufacturer 05/28/2022  [facility]   Final   Pulse Gen Model 05/28/2022 2272 Assurity MRI   Final   Pulse Gen Serial Number 05/28/2022 [id number]   Final   Clinic Name 05/28/2022 [facility]   Final   Implantable Pulse Generator Type 05/28/2022 Implantable Pulse Generator   Final   Implantable Pulse Generator Implan* 05/28/2022 [id number]   Final   Implantable Lead Manufacturer 05/28/2022 [facility]  Final   Implantable Lead Model 05/28/2022 LPA1200M Tendril MRI   Final   Implantable Lead Serial Number 05/28/2022 [id number]   Final   Implantable Lead Implant Date 05/28/2022 [id number]   Final   Implantable Lead Location Detail 1 05/28/2022 UNKNOWN   Final   Implantable Lead Location 05/28/2022 [id number]   Final   Implantable Lead Connection Status 05/28/2022 [id number]   Final   Implantable Lead Manufacturer 05/28/2022 [facility]   Final   Implantable Lead Model 05/28/2022 LPA1200M Tendril MRI   Final   Implantable Lead Serial Number 05/28/2022 [id number]   Final   Implantable Lead Implant Date 05/28/2022 [id number]   Final   Implantable Lead Location Detail 1 05/28/2022 UNKNOWN   Final   Implantable Lead Location 05/28/2022 [id number]   Final   Implantable Lead Connection Status 05/28/2022 [id number]   Final   Lead Channel Setting Sensing Sensi* 05/28/2022 2.0  mV Final   Lead Channel Setting Sensing Adapt* 05/28/2022 Fixed Pacing   Final   Lead Channel Setting Pacing Amplit* 05/28/2022 2.0  V Final   Lead Channel Setting Pacing Pulse * 05/28/2022 0.5  ms Final   Lead Channel Setting Pacing Amplit* 05/28/2022 2.5  V Final   Lead Channel Status 05/28/2022 NULL   Final   Lead Channel Impedance Value 05/28/2022 460  ohm Final   Lead Channel Sensing Intrinsic Amp* 05/28/2022 2.4  mV Final   Lead Channel Pacing Threshold Ampl* 05/28/2022 0.75  V Final   Lead Channel Pacing Threshold Puls* 05/28/2022 0.5  ms Final   Lead Channel Status 05/28/2022 NULL   Final   Lead Channel Impedance Value 05/28/2022 480  ohm Final   Lead Channel Sensing Intrinsic Amp*  05/28/2022 6.6  mV Final   Lead Channel Pacing Threshold Ampl* 05/28/2022 1.0  V Final   Lead Channel Pacing Threshold Puls* 05/28/2022 0.5  ms Final   Battery Status 05/28/2022 MOS   Final   Battery Remaining Longevity 05/28/2022 68  mo Final   Battery Remaining Percentage 05/28/2022 69.0  % Final   Battery Voltage 05/28/2022 2.99  V Final   Brady Statistic RA Percent Paced 05/28/2022 1.0  % Final   Brady Statistic RV Percent Paced 05/28/2022 99.0  % Final   Brady Statistic AP VP Percent 05/28/2022 1.0  % Final   Brady Statistic AS VP Percent 05/28/2022 99.0  % Final   Brady Statistic AP VS Percent 05/28/2022 1.0  % Final   Brady Statistic AS VS Percent 05/28/2022 1.0  % Final    Immunization History  Administered Date(s) Administered   Fluad Quad(high Dose 65+) 03/29/2022   Influenza Split 03/04/2013, 02/28/2014, 03/01/2015, 02/26/2016   Influenza, Seasonal, Injecte, Preservative Fre 03/04/2013, 02/28/2014   Influenza,trivalent, recombinat, inj, PF 03/01/2015, 02/26/2016   Influenza-Unspecified 03/04/2013, 02/28/2014, 03/01/2015, 02/26/2016, 03/14/2021   Moderna Sars-Covid-2 Vaccination 07/14/2019, 08/10/2019   Pneumococcal Conjugate-13 05/29/2015   Pneumococcal Polysaccharide-23 06/14/2004, 09/17/2016   Pneumococcal-Unspecified 06/14/2004, 09/17/2016   Tdap 03/30/2012, 12/07/2018   Zoster Recombinat (Shingrix) 12/30/2020, 04/18/2021   Zoster, Live 03/30/2012    Past Medical History:  Diagnosis Date   Allergic rhinitis    chronic sinusitis   Arthritis    osetoarthritis   CAD (coronary artery disease)    a. 2009 PCI/DES to mLAD; b. 05/2010 s/p DES RCA and LAD.; c. 05/2013 Cath: LAD mild ISR, LCX nl, RCA 40p, patent stents.   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   Cataract    has  had lasik surg   COPD (chronic obstructive pulmonary disease) (HCC)    Depression    Diabetes mellitus type 2, controlled, without complications ([place])    Diabetes mellitus  without complication ([place])    Phreesia 08/22/2020   Diverticulosis    DJD (degenerative joint disease)    Esophagitis 1991   grade 1   GERD (gastroesophageal reflux disease)    Hiatal hernia   Hearing loss    wears bilateral hearing aids   Heart murmur    Dr Dennard Schaumann dx per patient   Hemorrhoids    Hyperlipidemia    Hypertension    Hypertensive heart disease    Iron deficiency anemia    Migraines    Myocardial infarction [facility])    Paroxysmal atrial fibrillation ([place])    a. s/p afib ablation 10/22/08 ([facility]).   PVC's (premature ventricular contractions)    Sleep apnea    Uses nightly.  Questionalble: RDI during the total sleep time 6h 28 mins was 3.55/hr during REM sleep was at 5.71/hr. Supine AHI was 5.93/hr.   Syncope 08/2018   Past Surgical History:  Procedure Laterality Date   ABDOMINAL HYSTERECTOMY     CARDIAC CATHETERIZATION  05/31/2010   The proximal LAD was then predilated with 2.0 x 12 trek. this was then stented with a 2.5 x 16 promus Element drug-eluting stent at 14 atmosphere and prostdilated with 2.75 x 12 [place] trek at 16 atmospheres(2.8 mm) resulting the reduction of 80% proximal LAD stenosis to 0% residual with excellent flow.   CARDIAC CATHETERIZATION  06/05/2010   Successful percutaneous coronary intervention to the right coronary artery with percutaneous transluminal coronary angioplasty/stenting and insertion 3.0 x 16 mm Promus DES post dilated to 3.35 mm with stenosis being reduced to 0%   CARDIAC CATHETERIZATION  02/29/2008   Post dilatation was performed using a 2.75 x 9 [place] sprinter, 10 atmospheres for 40 seconds and then 9 atmosphere for 35 sec. This resulted in the 80% area of narrowing pre-intervention, now appearing to normal. There was no edvidence of the dissection or thrombus and there was TIMI III flow pre and post.   CARDIAC CATHETERIZATION  02/28/2006   CORONARY ANGIOPLASTY WITH STENT PLACEMENT     3 stents/ 4 caths   DISTAL BICEPS TENDON REPAIR  Left 02/12/2022   Procedure: LEFT DISTAL BICEPS TENDON REPAIR;  Surgeon: Leandrew Koyanagi, MD;  Location: [facility];  Service: Orthopedics;  Laterality: Left;   DISTAL BICEPS TENDON REPAIR Left 03/03/2022   Procedure: LEFT DISTAL BICEPS TENDON REPAIR;  Surgeon: Leandrew Koyanagi, MD;  Location: [employer] [facility];  Service: Orthopedics;  Laterality: Left;   EYE SURGERY     FINGER ARTHROPLASTY Right 09/02/2021   Procedure: RIGHT THUMB CARPOMETACARPAL ARTHROPLASTY;  Surgeon: Leandrew Koyanagi, MD;  Location: [facility];  Service: Orthopedics;  Laterality: Right;  block in preop   KNEE ARTHROSCOPY     right   LASIK     LEFT HEART CATHETERIZATION WITH CORONARY ANGIOGRAM N/A 06/29/2013   Procedure: LEFT HEART CATHETERIZATION WITH CORONARY ANGIOGRAM;  Surgeon: Leonie Man, MD;  [facility] CATH LAB;  Service: Cardiovascular;  Laterality: N/A;   NASAL SINUS SURGERY  06/01/1999   NECK SURGERY     Cervical fusion   PACEMAKER IMPLANT N/A 05/01/2019   Procedure: PACEMAKER IMPLANT;  Surgeon: [name], MD;  Location: [place] CV LAB;  Service: Cardiovascular;  Laterality: N/A;   RADIOFREQUENCY ABLATION  09/28/2008   atrial fibrillation   ROTATOR  CUFF REPAIR     left   SHOULDER OPEN ROTATOR CUFF REPAIR     right   SPINE SURGERY     WRIST ARTHROCENTESIS     left   Current Outpatient Medications on File Prior to Visit  Medication Sig Dispense Refill   Accu-Chek FastClix Lancets MISC Use as directed to check blood sugars twice per day. 100 each 5   ACCU-CHEK GUIDE test strip USE TO CHECK BLOOD SUGAR LEVELS TWICE DAILY. 100 strip 1   acetaminophen (TYLENOL) 325 MG tablet Take 1-2 tablets (325-650 mg total) by mouth every 4 (four) hours as needed for mild pain.     amLODipine (NORVASC) 10 MG tablet TAKE 1 TABLET(10 MG) BY MOUTH DAILY 90 tablet 1   aspirin EC 81 MG tablet Take 81 mg by mouth at bedtime.     Blood Glucose Monitoring Suppl (ACCU-CHEK AVIVA PLUS) w/Device KIT Check BS bid E11.9 1 kit 1    cholecalciferol (VITAMIN D) 1000 UNITS tablet Take 1,000 Units by mouth at bedtime.      citalopram (CELEXA) 40 MG tablet TAKE 1 TABLET(40 MG) BY MOUTH DAILY 90 tablet 1   Coenzyme Q10 (CO Q 10) 100 MG CAPS Take 100 mg by mouth daily with breakfast.      fexofenadine (ALLEGRA) 180 MG tablet Take 180 mg by mouth daily with breakfast.      fluticasone (FLONASE) 50 MCG/ACT nasal spray INSTILL 2 SPRAYS IN THE  NOSE AT BEDTIME 48 g 3   furosemide (LASIX) 40 MG tablet TAKE 1 TABLET BY MOUTH  DAILY 90 tablet 3   Galcanezumab-gnlm (EMGALITY) 120 MG/ML SOAJ Inject 120 mg into the skin every 30 (thirty) days. 1.12 mL 11   JARDIANCE 25 MG TABS tablet TAKE 1 TABLET BY MOUTH DAILY BEFORE BREAKFAST 90 tablet 1   Lancets Misc. (ACCU-CHEK FASTCLIX LANCET) KIT USE AS DIRECTED TO CHECK BLOOD SUGAR TWICE DAILY 1 kit 1   Multiple Vitamin (MULTIVITAMIN WITH MINERALS) TABS tablet Take 1 tablet by mouth daily with breakfast.     nitroGLYCERIN (NITROSTAT) 0.4 MG SL tablet PLACE ONE TABLET UNDER THE TONGUE EVERY 5 MINUTES AS NEEDED FOR CHEST PAIN 25 tablet 3   pantoprazole (PROTONIX) 40 MG tablet TAKE 1 TABLET BY MOUTH DAILY 90 tablet 3   Polyvinyl Alcohol-Povidone (REFRESH OP) Place 1 drop into both eyes at bedtime as needed (dry eyes).     potassium chloride (MICRO-K) 10 MEQ CR capsule TAKE 1 CAPSULE BY MOUTH DAILY 90 capsule 3   Rimegepant Sulfate 75 MG TBDP Take 75 mg by mouth daily as needed (Migraine). 16 tablet 11   rosuvastatin (CRESTOR) 40 MG tablet TAKE 1 TABLET BY MOUTH DAILY 90 tablet 3   telmisartan (MICARDIS) 40 MG tablet TAKE 1 TABLET BY MOUTH  DAILY 90 tablet 3   tirzepatide (MOUNJARO) 10 MG/0.5ML Pen Inject 10 mg into the skin once a week. 6 mL 1   No current facility-administered medications on file prior to visit.   Allergies  Allergen Reactions   Ibuprofen Swelling    Whole body swelled   Other Shortness Of Breath    BETA BLOCKERS   Topamax [Topiramate] Other (See Comments)    Pt states it  made his tongue feel "scalded" and he couldn't eat    Toprol Xl [Metoprolol Succinate] Other (See Comments)    Personality change   Metoprolol-Hctz Er Other (See Comments)    Indigestion,mouth raw   Social History   Socioeconomic History   Marital  status: Married    Spouse name: Katharine Look   Number of children: 2   Years of education: Not on file   Highest education level: Not on file  Occupational History   Occupation: retired  Tobacco Use   Smoking status: Former    Packs/day: 2.00    Years: 25.00    Total pack years: 50.00    Types: Cigarettes, Cigars    Quit date: 04/22/1988    Years since quitting: 34.1   Smokeless tobacco: Never  Vaping Use   Vaping Use: Never used  Substance and Sexual Activity   Alcohol use: Not Currently   Drug use: No   Sexual activity: Not Currently    Birth control/protection: Surgical    Comment: vas  Other Topics Concern   Not on file  Social History Narrative   Retired Social research officer, 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   Currently works part time for [employer].   2 daughters.    Married x 43 years 09/2021.   Caffiene:  1 cup daily, 2 diet drinks daily. (Caffiene free most time).    Social Determinants of Health   Financial Resource Strain: Low Risk  (08/27/2021)   Overall Financial Resource Strain (CARDIA)    Difficulty of Paying Living Expenses: Not hard at all  Food Insecurity: No Food Insecurity (08/27/2021)   Hunger Vital Sign    Worried About Running Out of Food in the Last Year: Never true    Ran Out of Food in the Last Year: Never true  Transportation Needs: No Transportation Needs (08/27/2021)   PRAPARE - Hydrologist (Medical): No    Lack of Transportation (Non-Medical): No  Physical Activity: Sufficiently Active (08/27/2021)   Exercise Vital Sign    Days of Exercise per Week: 4 days    Minutes of Exercise per Session: 60 min  Stress: No Stress Concern Present (08/27/2021)   [place]    Feeling of Stress : Not at all  Social Connections: [place] (08/27/2021)   Social Connection and Isolation Panel [NHANES]    Frequency of Communication with Friends and Family: More than three times a week    Frequency of Social Gatherings with Friends and Family: More than three times a week    Attends Religious Services: More than 4 times per year    Active Member of [employer] or Organizations: Yes    Attends Music therapist: More than 4 times per year    Marital Status: Married  Human resources officer Violence: Not At Risk (08/27/2021)   Humiliation, Afraid, Rape, and Kick questionnaire    Fear of Current or Ex-Partner: No    Emotionally Abused: No    Physically Abused: No    Sexually Abused: No   Family History  Problem Relation Age of Onset   Colon cancer Mother        mets from uterine   Uterine cancer Mother    Heart disease Father        and sister   Diabetes Father    Hearing loss Father    Stroke Father    Heart disease Sister    Lung cancer Sister    Stroke Brother      Review of Systems  All other systems reviewed and are negative.      Objective:   Physical Exam Vitals reviewed.  Constitutional:      General: He is not  in acute distress.    Appearance: He is well-developed. He is not diaphoretic.  HENT:     Head: Normocephalic and atraumatic.     Right Ear: External ear normal.     Left Ear: External ear normal.     Nose: Nose normal.     Mouth/Throat:     Pharynx: No oropharyngeal exudate.  Eyes:     General: No scleral icterus.       Right eye: No discharge.        Left eye: No discharge.     Conjunctiva/sclera: Conjunctivae normal.     Pupils: Pupils are equal, round, and reactive to light.  Neck:     Thyroid: No thyromegaly.     Vascular: No JVD.     Trachea: No tracheal deviation.  Cardiovascular:     Rate and Rhythm: Normal rate and regular rhythm.     Heart  sounds: Murmur heard.     No friction rub. No gallop.  Pulmonary:     Effort: Pulmonary effort is normal. No respiratory distress.     Breath sounds: Normal breath sounds. No stridor. No wheezing or rales.  Chest:     Chest wall: No tenderness.  Abdominal:     General: Bowel sounds are normal. There is no distension.     Palpations: Abdomen is soft. There is no mass.     Tenderness: There is no abdominal tenderness. There is no guarding or rebound.     Hernia: No hernia is present.  Musculoskeletal:        General: No tenderness. Normal range of motion.     Cervical back: Normal range of motion and neck supple.  Lymphadenopathy:     Cervical: No cervical adenopathy.  Skin:    General: Skin is warm.     Capillary Refill: Capillary refill takes less than 2 seconds.     Coloration: Skin is not pale.     Findings: No erythema or rash.  Neurological:     Mental Status: He is alert and oriented to person, place, and time.     Cranial Nerves: No cranial nerve deficit.     Sensory: No sensory deficit.     Motor: No abnormal muscle tone.     Coordination: Coordination normal.     Deep Tendon Reflexes: Reflexes normal.  Psychiatric:        Behavior: Behavior normal.        Thought Content: Thought content normal.        Judgment: Judgment normal.           Assessment & Plan:  Colon cancer screening - Plan: Ambulatory referral to Gastroenterology  Type 2 diabetes mellitus with complication, without long-term current use of insulin ([place])  Coronary artery disease involving native coronary artery of native heart without angina pectoris  Encounter for Medicare annual wellness exam Blood work is outstanding.  Blood pressure is excellent.  LDL is well below his goal of 55.  A1c is below his goal of 6.5.  PSA is normal.  Schedule the patient for colonoscopy.  I am concerned about the tachyarrhythmia that the patient is experiencing.  Could be PVCs.  Could be SVT.  I am concerned about  atrial fibrillation.  He is not currently on any type of blood thinner for this other than an aspirin.  Therefore I feel it is important to get him into see his cardiologist for cardiac monitor.  If he does have paroxysmal atrial fibrillation he would  benefit from Eliquis.

## 2022-06-25 NOTE — Addendum Note (Signed)
Addended by: Jenna Luo T on: 06/25/2022 11:08 AM   Modules accepted: Orders

## 2022-06-26 LAB — PROTEIN / CREATININE RATIO, URINE
Creatinine, Urine: 17 mg/dL — ABNORMAL LOW (ref 20–320)
Total Protein, Urine: <4 mg/dL — ABNORMAL LOW (ref 5–25)

## 2022-06-29 ENCOUNTER — Encounter: Payer: Self-pay | Admitting: *Deleted

## 2022-07-07 ENCOUNTER — Encounter: Payer: Self-pay | Admitting: Gastroenterology

## 2022-07-14 ENCOUNTER — Telehealth: Payer: Self-pay | Admitting: Orthopaedic Surgery

## 2022-07-14 NOTE — Telephone Encounter (Signed)
Called patient left message to return call to schedule an appointment with Dr. Erlinda Hong for hip problems per [facility] message

## 2022-07-16 ENCOUNTER — Ambulatory Visit: Payer: Medicare Other | Attending: Nurse Practitioner | Admitting: Nurse Practitioner

## 2022-07-16 ENCOUNTER — Encounter: Payer: Self-pay | Admitting: Nurse Practitioner

## 2022-07-16 VITALS — BP 114/78 | HR 86 | Ht 68.0 in | Wt 212.8 lb

## 2022-07-16 DIAGNOSIS — G4733 Obstructive sleep apnea (adult) (pediatric): Secondary | ICD-10-CM

## 2022-07-16 DIAGNOSIS — Z950 Presence of cardiac pacemaker: Secondary | ICD-10-CM | POA: Diagnosis not present

## 2022-07-16 DIAGNOSIS — I441 Atrioventricular block, second degree: Secondary | ICD-10-CM | POA: Diagnosis not present

## 2022-07-16 DIAGNOSIS — E785 Hyperlipidemia, unspecified: Secondary | ICD-10-CM

## 2022-07-16 DIAGNOSIS — I2510 Atherosclerotic heart disease of native coronary artery without angina pectoris: Secondary | ICD-10-CM | POA: Diagnosis not present

## 2022-07-16 DIAGNOSIS — E118 Type 2 diabetes mellitus with unspecified complications: Secondary | ICD-10-CM

## 2022-07-16 DIAGNOSIS — I442 Atrioventricular block, complete: Secondary | ICD-10-CM | POA: Diagnosis not present

## 2022-07-16 DIAGNOSIS — I350 Nonrheumatic aortic (valve) stenosis: Secondary | ICD-10-CM

## 2022-07-16 DIAGNOSIS — I10 Essential (primary) hypertension: Secondary | ICD-10-CM

## 2022-07-16 NOTE — Patient Instructions (Signed)
Medication Instructions:  Your physician recommends that you continue on your current medications as directed. Please refer to the Current Medication list given to you today.  *If you need a refill on your cardiac medications before your next appointment, please call your pharmacy*   Lab Work: NONE ordered at this time of appointment   If you have labs (blood work) drawn today and your tests are completely normal, you will receive your results only by: [place] (if you have MyChart) OR A paper copy in the mail If you have any lab test that is abnormal or we need to change your treatment, we will call you to review the results.   Testing/Procedures: Your physician has requested that you have an echocardiogram 08/2022. Echocardiography is a painless test that uses sound waves to create images of your heart. It provides your doctor with information about the size and shape of your heart and how well your heart's chambers and valves are working. This procedure takes approximately one hour. There are no restrictions for this procedure. Please do NOT wear cologne, perfume, aftershave, or lotions (deodorant is allowed). Please arrive 15 minutes prior to your appointment time.    Follow-Up: At [facility], you and your health needs are our priority.  As part of our continuing mission to provide you with exceptional heart care, we have created designated Provider Care Teams.  These Care Teams include your primary Cardiologist (physician) and Advanced Practice Providers (APPs -  Physician Assistants and Nurse Practitioners) who all work together to provide you with the care you need, when you need it.  We recommend signing up for the patient portal called "MyChart".  Sign up information is provided on this After Visit Summary.  MyChart is used to connect with patients for Virtual Visits (Telemedicine).  Patients are able to view lab/test results, encounter notes, upcoming appointments,  etc.  Non-urgent messages can be sent to your provider as well.   To learn more about what you can do with MyChart, go to [email or web address].    Your next appointment:   6 month(s) 1 month (EP at [address]) Provider:   Shelva Majestic, MD     Other Instructions

## 2022-07-16 NOTE — Progress Notes (Unsigned)
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  Past Medical History    Past Medical History:  Diagnosis Date   Allergic rhinitis    chronic sinusitis   Arthritis    osetoarthritis   CAD (coronary artery disease)    a. 2009 PCI/DES to mLAD; b. 05/2010 s/p DES RCA and LAD.; c. 05/2013 Cath: LAD mild ISR, LCX nl, RCA 40p, patent stents.   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   Cataract    has had lasik surg   COPD (chronic obstructive pulmonary disease) ([place])    Depression    Diabetes mellitus type 2, controlled, without complications ([place])    Diabetes mellitus without complication ([place])    Phreesia 08/22/2020   Diverticulosis    DJD (degenerative joint disease)    Esophagitis 1991   grade 1   GERD (gastroesophageal reflux disease)    Hiatal hernia   Hearing loss    wears bilateral hearing aids   Heart murmur    Dr Dennard Schaumann dx per patient   Hemorrhoids    Hyperlipidemia    Hypertension    Hypertensive heart disease    Iron deficiency anemia    Migraines    Myocardial infarction [facility])    Paroxysmal atrial fibrillation ([place])    a. s/p afib ablation 10/22/08 ([facility]).   PVC's (premature ventricular contractions)    Sleep apnea    Uses nightly.  Questionalble: RDI during the total sleep time 6h 28 mins was 3.55/hr during REM sleep was at 5.71/hr. Supine AHI was 5.93/hr.   Syncope 08/2018   Past Surgical History:  Procedure Laterality Date   ABDOMINAL HYSTERECTOMY     CARDIAC CATHETERIZATION  05/31/2010   The proximal LAD was then  predilated with 2.0 x 12 trek. this was then stented with a 2.5 x 16 promus Element drug-eluting stent at 14 atmosphere and prostdilated with 2.75 x 12 [place]ney trek at 16 atmospheres(2.8 mm) resulting the reduction of 80% proximal LAD stenosis to 0% residual with excellent flow.   CARDIAC CATHETERIZATION  06/05/2010   Successful percutaneous coronary intervention to the right coronary artery with percutaneous transluminal coronary angioplasty/stenting and insertion 3.0 x 16 mm Promus DES post dilated to 3.35 mm with stenosis being reduced to 0%   CARDIAC CATHETERIZATION  02/29/2008   Post dilatation was performed using a 2.75 x 9 [place] sprinter, 10 atmospheres for 40 seconds and then 9 atmosphere for 35 sec. This resulted in the 80% area of narrowing pre-intervention, now appearing to normal. There was no edvidence of the dissection or thrombus and there was TIMI III flow pre and post.   CARDIAC CATHETERIZATION  02/28/2006   CORONARY ANGIOPLASTY WITH STENT PLACEMENT     3 stents/ 4 caths   DISTAL BICEPS TENDON REPAIR Left 02/12/2022   Procedure: LEFT DISTAL BICEPS TENDON REPAIR;  Surgeon: Leandrew Koyanagi, MD;  Location: [place];  Service: Orthopedics;  Laterality: Left;   DISTAL BICEPS TENDON REPAIR Left 03/03/2022   Procedure: LEFT DISTAL BICEPS TENDON REPAIR;  Surgeon: Leandrew Koyanagi, MD;  Location: [place];  Service: Orthopedics;  Laterality: Left;   EYE SURGERY     FINGER ARTHROPLASTY Right 09/02/2021   Procedure: RIGHT THUMB CARPOMETACARPAL ARTHROPLASTY;  Surgeon: Leandrew Koyanagi, MD;  Location: [place];  Service: Orthopedics;  Laterality: Right;  block in preop   KNEE ARTHROSCOPY     right   LASIK     LEFT HEART CATHETERIZATION WITH CORONARY ANGIOGRAM N/A 06/29/2013   Procedure: LEFT HEART CATHETERIZATION WITH CORONARY ANGIOGRAM;  Surgeon: Leonie Man, MD;  Location: [facility] CATH LAB;  Service: Cardiovascular;  Laterality: N/A;   NASAL SINUS SURGERY  06/01/1999   NECK SURGERY     Cervical  fusion   PACEMAKER IMPLANT N/A 05/01/2019   Procedure: PACEMAKER IMPLANT;  Surgeon: [name], MD;  Location: [place] CV LAB;  Service: Cardiovascular;  Laterality: N/A;   RADIOFREQUENCY ABLATION  09/28/2008   atrial fibrillation   ROTATOR CUFF REPAIR     left   SHOULDER OPEN ROTATOR CUFF REPAIR     right   SPINE SURGERY     WRIST ARTHROCENTESIS     left    Allergies  Allergies  Allergen Reactions   Ibuprofen Swelling    Whole body swelled   Other Shortness Of Breath    BETA BLOCKERS   Topamax [Topiramate] Other (See Comments)    Pt states it made his tongue feel "scalded" and he couldn't eat    Toprol Xl [Metoprolol Succinate] Other (See Comments)    Personality change   Metoprolol-Hctz Er Other (See Comments)    Indigestion,mouth raw     Labs/Other Studies Reviewed    The following studies were reviewed today: Echo 08/2021: IMPRESSIONS:   1. Left ventricular ejection fraction, by estimation, is 55 to 60%. The  left ventricle has normal function. The left ventricle has no regional  wall motion abnormalities. Left ventricular diastolic parameters are  consistent with Grade I diastolic  dysfunction (impaired relaxation).   2. Right ventricular systolic function is normal. The right ventricular  size is normal. Tricuspid regurgitation signal is inadequate for assessing  PA pressure.   3. The mitral valve is normal in structure. Trivial mitral valve  regurgitation. No evidence of mitral stenosis.   4. The aortic valve has an indeterminant number of cusps. Aortic valve  regurgitation is not visualized. Mild to moderate aortic valve stenosis.   5. The inferior vena cava is normal in size with greater than 50%  respiratory variability, suggesting right atrial pressure of 3 mmHg.   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  He had stenting to his LAD in 2009.  In 2012 he underwent staged intervention with stenting of his proximal RCA and to a new site in his proximal LAD.  Repeat  cardiac catheterization in 2015 did not demonstrate significant restenosis of his LAD stents with mild 20% narrowing in the proximal stent and 40% mid stent.  Left circumflex was normal, RCA had 40% narrowing just proximal to a widely patent stent in the mid vessel.  Nitrate therapy was added to his medical regimen, however this was ultimately discontinued due to nitrate mediated headaches.  Myocardial perfusion study in 08/2015 was normal, EF 62%.  Echocardiogram in 2020 showed EF 60 to 65%, G2 DD.  He was hospitalized in May 2020 with recurrent syncope and was found to have significant pauses on cardiac monitor, complete heart block, second-degree AV block Mobitz type II s/p PPM in 05/2019.  He was last seen in the office on 03/29/2022 and was stable from a cardiac standpoint.  He denies symptoms concerning for angina.  BP was well-controlled.  He reported 100% adherence to his CPAP.  He saw his PCP on 06/25/2022 and noted episodes of elevated heart rate.  He was advised to follow-up with cardiology.  He presents today for follow-up.  Since his last visit he has been stable from a cardiac standpoint.  He notes intermittent episodes where he feels that his heart rate is "speeding up." This usually last for less than 10 minutes.   He denies any associated symptoms.  He has noted some mild chest discomfort at rest which she describes as a sharp fleeting pain that last for only seconds and resolves spontaneously, similar to prior anginal equivalent.  He denies any exertional symptoms concerning for angina. He is asking about  possible titration of his CPAP machine to lower the pressure-he states he previously discussed this with Dr. Claiborne Billings.  Overall, he reports feeling well.  Home Medications    Current Outpatient Medications  Medication Sig Dispense Refill   Accu-Chek FastClix Lancets MISC Use as directed to check blood sugars twice per day. 100 each 5   ACCU-CHEK GUIDE test strip USE TO CHECK BLOOD SUGAR LEVELS TWICE DAILY. 100 strip 1   acetaminophen (TYLENOL) 325 MG tablet Take 1-2 tablets (325-650 mg total) by mouth every 4 (four) hours as needed for mild pain.     amLODipine (NORVASC) 10 MG tablet TAKE 1 TABLET(10 MG) BY MOUTH DAILY 90 tablet 1   aspirin EC 81 MG tablet Take 81 mg by mouth at bedtime.     Blood Glucose Monitoring Suppl (ACCU-CHEK AVIVA PLUS) w/Device KIT Check BS bid E11.9 1 kit 1   cholecalciferol (VITAMIN D) 1000 UNITS tablet Take 1,000 Units by mouth at bedtime.      citalopram (CELEXA) 40 MG tablet TAKE 1 TABLET(40 MG) BY MOUTH DAILY 90 tablet 1   Coenzyme Q10 (CO Q 10) 100 MG CAPS Take 100 mg by mouth daily with breakfast.      fexofenadine (ALLEGRA) 180 MG tablet Take 180 mg by mouth daily with breakfast.      fluticasone (FLONASE) 50 MCG/ACT nasal spray INSTILL 2 SPRAYS IN THE  NOSE AT BEDTIME 48 g 3   furosemide (LASIX) 40 MG tablet TAKE 1 TABLET BY MOUTH  DAILY 90 tablet 3   Galcanezumab-gnlm (EMGALITY) 120 MG/ML SOAJ Inject 120 mg into the skin every 30 (thirty) days. 1.12 mL 11   JARDIANCE 25 MG TABS tablet TAKE 1 TABLET BY MOUTH DAILY BEFORE BREAKFAST 90 tablet 1   Lancets Misc. (ACCU-CHEK FASTCLIX LANCET) KIT USE AS DIRECTED TO CHECK BLOOD SUGAR TWICE DAILY 1 kit 1   Multiple Vitamin  (MULTIVITAMIN WITH MINERALS) TABS tablet  Take 1 tablet by mouth daily with breakfast.     nitroGLYCERIN (NITROSTAT) 0.4 MG SL tablet PLACE ONE TABLET UNDER THE TONGUE EVERY 5 MINUTES AS NEEDED FOR CHEST PAIN 25 tablet 3   oxyCODONE-acetaminophen (PERCOCET) 10-325 MG tablet Take 1 tablet by mouth every 8 (eight) hours as needed for pain. 21 tablet 0   pantoprazole (PROTONIX) 40 MG tablet TAKE 1 TABLET BY MOUTH DAILY 90 tablet 3   Polyvinyl Alcohol-Povidone (REFRESH OP) Place 1 drop into both eyes at bedtime as needed (dry eyes).     potassium chloride (MICRO-K) 10 MEQ CR capsule TAKE 1 CAPSULE BY MOUTH DAILY 90 capsule 3   Rimegepant Sulfate 75 MG TBDP Take 75 mg by mouth daily as needed (Migraine). 16 tablet 11   rosuvastatin (CRESTOR) 40 MG tablet TAKE 1 TABLET BY MOUTH DAILY 90 tablet 3   telmisartan (MICARDIS) 40 MG tablet TAKE 1 TABLET BY MOUTH  DAILY 90 tablet 3   tirzepatide (MOUNJARO) 10 MG/0.5ML Pen Inject 10 mg into the skin once a week. 6 mL 1   No current facility-administered medications for this visit.     Review of Systems    He denies  dyspnea, pnd, orthopnea, n, v, dizziness, syncope, edema, weight gain, or early satiety. All other systems reviewed and are otherwise negative except as noted above.   Physical Exam    VS:  BP 114/78   Pulse 86   Ht 5' 8"$  (1.727 m)   Wt 212 lb 12.8 oz (96.5 kg)   SpO2 98%   BMI 32.36 kg/m   GEN: Well nourished, well developed, in no acute distress. HEENT: normal. Neck: Supple, no JVD, carotid bruits, or masses. Cardiac: RRR, no murmurs, rubs, or gallops. No clubbing, cyanosis, edema.  Radials/DP/PT 2+ and equal bilaterally.  Respiratory:  Respirations regular and unlabored, clear to auscultation bilaterally. GI: Soft, nontender, nondistended, BS + x 4. MS: no deformity or atrophy. Skin: warm and dry, no rash. Neuro:  Strength and sensation are intact. Psych: Normal affect.  Accessory Clinical Findings    ECG personally reviewed by  me today - atrial sensed, V paced, 86 bpm -no acute changes.   Lab Results  Component Value Date   WBC 6.4 06/22/2022   HGB 15.2 06/22/2022   HCT 45.2 06/22/2022   MCV 88.1 06/22/2022   PLT 264 06/22/2022   Lab Results  Component Value Date   CREATININE 0.75 06/22/2022   BUN 15 06/22/2022   NA 138 06/22/2022   K 4.7 06/22/2022   CL 102 06/22/2022   CO2 27 06/22/2022   Lab Results  Component Value Date   ALT 14 06/22/2022   AST 12 06/22/2022   ALKPHOS 71 05/01/2019   BILITOT 0.4 06/22/2022   Lab Results  Component Value Date   CHOL 115 06/22/2022   HDL 56 06/22/2022   LDLCALC 44 06/22/2022   TRIG 69 06/22/2022   CHOLHDL 2.1 06/22/2022    Lab Results  Component Value Date   HGBA1C 6.2 (H) 06/22/2022    Assessment & Plan   1. CAD:  S/p DES-LAD in 2009, DES-RCA, LAD in 2012. Repeat cath in 2015 did not  demonstrate significant restenosis of his LAD stents with mild 20% narrowing in the proximal stent and 40% mid stent.  Left circumflex was normal, RCA had 40% narrowing just proximal to a widely patent stent in the mid vessel.  Myocardial perfusion study in 2017 was normal, EF 62%.  He did not tolerate nitrate  therapy due to headache. He notes some mild chest discomfort at rest which she describes as a sharp fleeting pain that last for only seconds and resolves spontaneously, similar to prior anginal equivalent.  He denies any exertional symptoms.  No indication for ischemic evaluation at this time.  Continue aspirin, amlodipine, telmisartan, Crestor.  2. Second-degree AV block Mobitz type II/complete heart block: S/p PPM.  He has had recent intermittent episodes heart rate "speeding up."  This lasts for less than 10 minutes at a time.  Denies any associated symptoms.  Recommend follow-up with EP.  3. Aortic stenosis: Noted on most recent echo in 08/2021 (mild to moderate). Asymptomatic. Will repeat echocardiogram for ongoing monitoring.   4. Hypertension: BP well controlled.  Continue current antihypertensive regimen.   5. Hyperlipidemia: LDL was 47 in 05/2022.  Continue Crestor.  6. OSA: Adherent to CPAP.  He is asking about lowering the pressure setitngs on his machine.  I reached out to our sleep coordinator-patient was advised to call sleep coordinator to arrange for adjustment of settings.  7. Type 2 diabetes: A1C was 6.2 in 05/2022.  Monitored and managed per PCP.   8. Disposition: Follow-up with EP, follow-up with Dr. Claiborne Billings in 6 months.     Lenna Sciara, NP 07/16/2022, 5:00 PM

## 2022-07-18 ENCOUNTER — Encounter: Payer: Self-pay | Admitting: Nurse Practitioner

## 2022-07-20 ENCOUNTER — Ambulatory Visit: Payer: Medicare Other | Admitting: Podiatry

## 2022-07-20 ENCOUNTER — Ambulatory Visit (INDEPENDENT_AMBULATORY_CARE_PROVIDER_SITE_OTHER): Payer: Medicare Other | Admitting: Podiatry

## 2022-07-20 DIAGNOSIS — Q828 Other specified congenital malformations of skin: Secondary | ICD-10-CM | POA: Diagnosis not present

## 2022-07-20 DIAGNOSIS — E119 Type 2 diabetes mellitus without complications: Secondary | ICD-10-CM | POA: Diagnosis not present

## 2022-07-21 DIAGNOSIS — E119 Type 2 diabetes mellitus without complications: Secondary | ICD-10-CM | POA: Insufficient documentation

## 2022-07-21 NOTE — Progress Notes (Signed)
  Subjective:  Patient ID: Lee [name], male    DOB: Sep 30, 1950,  MRN: [id number]  Chief Complaint  Patient presents with   Callouses    Bilateral plantar foot callouses that have been bothering the patient. He would like them to be cut down.     72 y.o. male presents with the above complaint. History confirmed with patient.  He has multiple lesions under the fifth toe on both feet as well as the base of the fifth toe on the left  Objective:  Physical Exam: warm, good capillary refill, no trophic changes or ulcerative lesions, normal DP and PT pulses, normal sensory exam, and multiple porokeratosis 1 on the right submetatarsal 5, 2 on submetatarsal 5 on the left, 2 on submetatarsal 5 base on left  Assessment:   1. Porokeratosis   2. Type 2 diabetes mellitus treated without insulin ([place])      Plan:  Patient was evaluated and treated and all questions answered.  All symptomatic hyperkeratoses were safely debrided with a sterile #15 blade to patient's level of comfort without incident. We discussed preventative and palliative care of these lesions including supportive and accommodative shoegear, padding, prefabricated and custom molded accommodative orthoses, use of a pumice stone and lotions/creams daily.  Salinocaine ointment and bandages applied.  Offloading pads applied.   Return in about 3 months (around 10/18/2022) for skin lesion removal.

## 2022-07-22 ENCOUNTER — Encounter (HOSPITAL_BASED_OUTPATIENT_CLINIC_OR_DEPARTMENT_OTHER): Payer: Medicare Other | Admitting: Physical Therapy

## 2022-07-22 ENCOUNTER — Other Ambulatory Visit: Payer: Self-pay | Admitting: Family Medicine

## 2022-07-22 DIAGNOSIS — E118 Type 2 diabetes mellitus with unspecified complications: Secondary | ICD-10-CM

## 2022-07-22 NOTE — Telephone Encounter (Signed)
Unable to refill per protocol, Rx request is too soon. Last refill 04/28/22 for 100 and 5 refills.  Requested Prescriptions  Pending Prescriptions Disp Refills   Accu-Chek FastClix Lancets MISC [Pharmacy Med Name: ACCU-CHEK FASTCLIX LANCETS 102'S] 102 each     Sig: USE TO CHECK BLOOD SUGAR TWICE DAILY.     Endocrinology: Diabetes - Testing Supplies Passed - 07/22/2022 11:50 AM      Passed - Valid encounter within last 12 months    Recent Outpatient Visits           10 months ago Coronary artery disease involving native coronary artery of native heart without angina pectoris   [place] Susy Frizzle, MD   1 year ago Type 2 diabetes mellitus with complication, without long-term current use of insulin ([place])   [place] Pickard, Cammie Mcgee, MD   1 year ago Wrist pain, chronic, right   [place] Pickard, Cammie Mcgee, MD   1 year ago General medical exam   [place] Susy Frizzle, MD   2 years ago Type 2 diabetes mellitus with complication, without long-term current use of insulin ([place])   [place] Pickard, Cammie Mcgee, MD       Future Appointments             In 6 months Troy Sine, MD [place] [facility]   In 11 months Pickard, Cammie Mcgee, MD [place], [facility]

## 2022-08-08 NOTE — Progress Notes (Unsigned)
Cardiology Office Note Date:  08/08/2022  Patient ID:  Lee, [name] 1950/06/18, MRN [id number] PCP:  Susy Frizzle, MD  Cardiologist:  Dr. Claiborne Billings Electrophysiologist: Dr. Rayann Heman    Chief Complaint: *** EP f/u  History of Present Illness: Lee [name] is a 72 y.o. male with history of CAD (PCIs 2007, 2009, 2012), HTN, HLD, DM, COPD, advanced heart block with PPM, AFib, OSA w/CPAP  He comes in today to be seen for Dr. Rayann Heman, last seen by him 09/01/20. Device functioning normally, noted to be dependent. No afib since his ablation in 2010, off a/c.  Discussed resuming if recurrent.  I saw him June 2023 He is doing very well Intermittently feels PVCs that seem to come for a few days then g one again, not so much that he would want an adjustment in his meds. No CP, no SIB He exercises regularly, either walks outside or he and his wife go to the fitness center and he will use the bike or rowing machine. No exertional incapacities. No near syncope or syncope. Stable device function, he was devic dependent and VIP turned off, no other changes were made.   Since then he has had a few orthopedic surgeries,  Sept 23  Bicep rupture repair Oct 23, reoperation of failure of bicep fixation He saw Dr. Claiborne Billings 03/29/22, doing well He saw E. Monge, NP 07/16/22, reported episodes of palpitations, as well as CP, not felt to warrant ischemic evaluation, recommended f/u with EP regarding his palpitations  *** symptoms *** AFib? HVR? *** + remotes *** new EP/WC reading remotes  Device information Abbott dual chamber PPM implanted 05/01/2019   Past Medical History:  Diagnosis Date   Allergic rhinitis    chronic sinusitis   Arthritis    osetoarthritis   CAD (coronary artery disease)    a. 2009 PCI/DES to mLAD; b. 05/2010 s/p DES RCA and LAD.; c. 05/2013 Cath: LAD mild ISR, LCX nl, RCA 40p, patent stents.   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   Cataract     has had lasik surg   COPD (chronic obstructive pulmonary disease) ([place])    Depression    Diabetes mellitus type 2, controlled, without complications ([place])    Diabetes mellitus without complication ([place])    Phreesia 08/22/2020   Diverticulosis    DJD (degenerative joint disease)    Esophagitis 1991   grade 1   GERD (gastroesophageal reflux disease)    Hiatal hernia   Hearing loss    wears bilateral hearing aids   Heart murmur    Dr Dennard Schaumann dx per patient   Hemorrhoids    Hyperlipidemia    Hypertension    Hypertensive heart disease    Iron deficiency anemia    Migraines    Myocardial infarction [facility])    Paroxysmal atrial fibrillation ([place])    a. s/p afib ablation 10/22/08 ([facility]).   PVC's (premature ventricular contractions)    Sleep apnea    Uses nightly.  Questionalble: RDI during the total sleep time 6h 28 mins was 3.55/hr during REM sleep was at 5.71/hr. Supine AHI was 5.93/hr.   Syncope 08/2018    Past Surgical History:  Procedure Laterality Date   ABDOMINAL HYSTERECTOMY     CARDIAC CATHETERIZATION  05/31/2010   The proximal LAD was then predilated with 2.0 x 12 trek. this was then stented with a 2.5 x 16 promus Element drug-eluting stent at 14 atmosphere and  prostdilated with 2.75 x 12 [place] trek at 16 atmospheres(2.8 mm) resulting the reduction of 80% proximal LAD stenosis to 0% residual with excellent flow.   CARDIAC CATHETERIZATION  06/05/2010   Successful percutaneous coronary intervention to the right coronary artery with percutaneous transluminal coronary angioplasty/stenting and insertion 3.0 x 16 mm Promus DES post dilated to 3.35 mm with stenosis being reduced to 0%   CARDIAC CATHETERIZATION  02/29/2008   Post dilatation was performed using a 2.75 x 9  sprinter, 10 atmospheres for 40 seconds and then 9 atmosphere for 35 sec. This resulted in the 80% area of narrowing pre-intervention, now appearing to normal. There was no edvidence of the dissection or  thrombus and there was TIMI III flow pre and post.   CARDIAC CATHETERIZATION  02/28/2006   CORONARY ANGIOPLASTY WITH STENT PLACEMENT     3 stents/ 4 caths   DISTAL BICEPS TENDON REPAIR Left 02/12/2022   Procedure: LEFT DISTAL BICEPS TENDON REPAIR;  Surgeon: Leandrew Koyanagi, MD;  Location: [facility];  Service: Orthopedics;  Laterality: Left;   DISTAL BICEPS TENDON REPAIR Left 03/03/2022   Procedure: LEFT DISTAL BICEPS TENDON REPAIR;  Surgeon: Leandrew Koyanagi, MD;  Location: [facility];  Service: Orthopedics;  Laterality: Left;   EYE SURGERY     FINGER ARTHROPLASTY Right 09/02/2021   Procedure: RIGHT THUMB CARPOMETACARPAL ARTHROPLASTY;  Surgeon: Leandrew Koyanagi, MD;  Location: [facility];  Service: Orthopedics;  Laterality: Right;  block in preop   KNEE ARTHROSCOPY     right   LASIK     LEFT HEART CATHETERIZATION WITH CORONARY ANGIOGRAM N/A 06/29/2013   Procedure: LEFT HEART CATHETERIZATION WITH CORONARY ANGIOGRAM;  Surgeon: Leonie Man, MD;  Location: [facility] CATH LAB;  Service: Cardiovascular;  Laterality: N/A;   NASAL SINUS SURGERY  06/01/1999   NECK SURGERY     Cervical fusion   PACEMAKER IMPLANT N/A 05/01/2019   Procedure: PACEMAKER IMPLANT;  Surgeon: [name], MD;  Location: [facility] CV LAB;  Service: Cardiovascular;  Laterality: N/A;   RADIOFREQUENCY ABLATION  09/28/2008   atrial fibrillation   ROTATOR CUFF REPAIR     left   SHOULDER OPEN ROTATOR CUFF REPAIR     right   SPINE SURGERY     WRIST ARTHROCENTESIS     left    Current Outpatient Medications  Medication Sig Dispense Refill   Accu-Chek FastClix Lancets MISC Use as directed to check blood sugars twice per day. 100 each 5   ACCU-CHEK GUIDE test strip USE TO CHECK BLOOD SUGAR LEVELS TWICE DAILY. 100 strip 1   acetaminophen (TYLENOL) 325 MG tablet Take 1-2 tablets (325-650 mg total) by mouth every 4 (four) hours as needed for mild pain.     amLODipine (NORVASC) 10 MG tablet TAKE 1 TABLET(10 MG) BY MOUTH DAILY 90  tablet 1   aspirin EC 81 MG tablet Take 81 mg by mouth at bedtime.     Blood Glucose Monitoring Suppl (ACCU-CHEK AVIVA PLUS) w/Device KIT Check BS bid E11.9 1 kit 1   cholecalciferol (VITAMIN D) 1000 UNITS tablet Take 1,000 Units by mouth at bedtime.      citalopram (CELEXA) 40 MG tablet TAKE 1 TABLET(40 MG) BY MOUTH DAILY 90 tablet 1   Coenzyme Q10 (CO Q 10) 100 MG CAPS Take 100 mg by mouth daily with breakfast.      fexofenadine (ALLEGRA) 180 MG tablet Take 180 mg by mouth daily with breakfast.      fluticasone (FLONASE)  50 MCG/ACT nasal spray INSTILL 2 SPRAYS IN THE  NOSE AT BEDTIME 48 g 3   furosemide (LASIX) 40 MG tablet TAKE 1 TABLET BY MOUTH  DAILY 90 tablet 3   Galcanezumab-gnlm (EMGALITY) 120 MG/ML SOAJ Inject 120 mg into the skin every 30 (thirty) days. 1.12 mL 11   JARDIANCE 25 MG TABS tablet TAKE 1 TABLET BY MOUTH DAILY BEFORE BREAKFAST 90 tablet 1   Lancets Misc. (ACCU-CHEK FASTCLIX LANCET) KIT USE AS DIRECTED TO CHECK BLOOD SUGAR TWICE DAILY 1 kit 1   Multiple Vitamin (MULTIVITAMIN WITH MINERALS) TABS tablet Take 1 tablet by mouth daily with breakfast.     nitroGLYCERIN (NITROSTAT) 0.4 MG SL tablet PLACE ONE TABLET UNDER THE TONGUE EVERY 5 MINUTES AS NEEDED FOR CHEST PAIN 25 tablet 3   oxyCODONE-acetaminophen (PERCOCET) 10-325 MG tablet Take 1 tablet by mouth every 8 (eight) hours as needed for pain. 21 tablet 0   pantoprazole (PROTONIX) 40 MG tablet TAKE 1 TABLET BY MOUTH DAILY 90 tablet 3   Polyvinyl Alcohol-Povidone (REFRESH OP) Place 1 drop into both eyes at bedtime as needed (dry eyes).     potassium chloride (MICRO-K) 10 MEQ CR capsule TAKE 1 CAPSULE BY MOUTH DAILY 90 capsule 3   Rimegepant Sulfate 75 MG TBDP Take 75 mg by mouth daily as needed (Migraine). 16 tablet 11   rosuvastatin (CRESTOR) 40 MG tablet TAKE 1 TABLET BY MOUTH DAILY 90 tablet 3   telmisartan (MICARDIS) 40 MG tablet TAKE 1 TABLET BY MOUTH  DAILY 90 tablet 3   tirzepatide (MOUNJARO) 10 MG/0.5ML Pen Inject 10  mg into the skin once a week. 6 mL 1   No current facility-administered medications for this visit.    Allergies:   Ibuprofen, Other, Topamax [topiramate], Toprol xl [metoprolol succinate], and Metoprolol-hctz er   Social History:  The patient  reports that he quit smoking about 34 years ago. His smoking use included cigarettes and cigars. He has a 50.00 pack-year smoking history. He has never used smokeless tobacco. He reports that he does not currently use alcohol. He reports that he does not use drugs.   Family History:  The patient's family history includes Colon cancer in his mother; Diabetes in his father; Hearing loss in his father; Heart disease in his father and sister; Lung cancer in his sister; Stroke in his brother and father; Uterine cancer in his mother.  ROS:  Please see the history of present illness.    All other systems are reviewed and otherwise negative.   PHYSICAL EXAM:  VS:  There were no vitals taken for this visit. BMI: There is no height or weight on file to calculate BMI. Well nourished, well developed, in no acute distress HEENT: normocephalic, atraumatic Neck: no JVD, carotid bruits or masses Cardiac: *** RRR; no significant murmurs, no rubs, or gallops Lungs: *** CTA b/l, no wheezing, rhonchi or rales Abd: soft, nontender MS: no deformity or atrophy Ext: *** no edema Skin: warm and dry, no rash Neuro:  No gross deficits appreciated Psych: euthymic mood, full affect  *** PPM site is stable, no tethering or discomfort   EKG:  not done today  Device interrogation done today and reviewed by myself:  ***   09/23/2021: TTE  1. Left ventricular ejection fraction, by estimation, is 55 to 60%. The  left ventricle has normal function. The left ventricle has no regional  wall motion abnormalities. Left ventricular diastolic parameters are  consistent with Grade I diastolic  dysfunction (impaired  relaxation).   2. Right ventricular systolic function is  normal. The right ventricular  size is normal. Tricuspid regurgitation signal is inadequate for assessing  PA pressure.   3. The mitral valve is normal in structure. Trivial mitral valve  regurgitation. No evidence of mitral stenosis.   4. The aortic valve has an indeterminant number of cusps. Aortic valve  regurgitation is not visualized. Mild to moderate aortic valve stenosis.   5. The inferior vena cava is normal in size with greater than 50%  respiratory variability, suggesting right atrial pressure of 3 mmHg.    03/07/2020: stress myoview The left ventricular ejection fraction is mildly decreased (45-54%). Nuclear stress EF: 54%. There was no ST segment deviation noted during stress. Defect 1: There is a large defect of moderate severity present in the basal inferior, mid inferior, apical inferior and apex location. This is an intermediate risk study.   There is a large defect of moderate severity throughout the entire inferior wall. This is unchanged between stress and rest images. There is normal wall motion in this area. There is significant extracardiac activity in the area as well. Taken together, this highly suggests artifact, but fixed defect cannot be excluded. No evidence of reversible ischemia.  Recent Labs: 06/22/2022: ALT 14; BUN 15; Creat 0.75; Hemoglobin 15.2; Platelets 264; Potassium 4.7; Sodium 138  06/22/2022: Cholesterol 115; HDL 56; LDL Cholesterol (Calc) 44; Total CHOL/HDL Ratio 2.1; Triglycerides 69   CrCl cannot be calculated (Patient's most recent lab result is older than the maximum 21 days allowed.).   Wt Readings from Last 3 Encounters:  07/16/22 212 lb 12.8 oz (96.5 kg)  06/25/22 212 lb 6.4 oz (96.3 kg)  03/29/22 216 lb 9.6 oz (98.2 kg)     Other studies reviewed: Additional studies/records reviewed today include: summarized above  ASSESSMENT AND PLAN:  PPM *** Intact function VIP programmed off  Paroxysmal AFib ***  % burden Notes report  ablation 2010, none since *** Follow burden *** Off a/c  CAD *** No symptoms *** Sees Dr. Claiborne Billings in the fall Labs are done with his PMD  HTN *** Looks good  Disposition: ***  Current medicines are reviewed at length with the patient today.  The patient did not have any concerns regarding medicines.  Venetia Night, PA-C 08/08/2022 8:32 AM     [address] [place] [address] [phone] (office)  [phone] (fax)

## 2022-08-09 ENCOUNTER — Encounter: Payer: Self-pay | Admitting: Physician Assistant

## 2022-08-09 ENCOUNTER — Ambulatory Visit: Payer: Medicare Other | Attending: Physician Assistant | Admitting: Physician Assistant

## 2022-08-09 VITALS — BP 122/68 | HR 90 | Ht 68.0 in | Wt 210.8 lb

## 2022-08-09 DIAGNOSIS — Z950 Presence of cardiac pacemaker: Secondary | ICD-10-CM

## 2022-08-09 DIAGNOSIS — I2510 Atherosclerotic heart disease of native coronary artery without angina pectoris: Secondary | ICD-10-CM

## 2022-08-09 DIAGNOSIS — I10 Essential (primary) hypertension: Secondary | ICD-10-CM | POA: Diagnosis not present

## 2022-08-09 DIAGNOSIS — I480 Paroxysmal atrial fibrillation: Secondary | ICD-10-CM

## 2022-08-09 LAB — CUP PACEART INCLINIC DEVICE CHECK
Battery Remaining Longevity: 67 mo
Battery Voltage: 2.99 V
Brady Statistic RA Percent Paced: 0.12 %
Brady Statistic RV Percent Paced: 99.92 %
Date Time Interrogation Session: 20240311130614
Implantable Lead Connection Status: 753985
Implantable Lead Connection Status: 753985
Implantable Lead Implant Date: 20201201
Implantable Lead Implant Date: 20201201
Implantable Lead Location: 753859
Implantable Lead Location: 753860
Implantable Pulse Generator Implant Date: 20201201
Lead Channel Impedance Value: 475 Ohm
Lead Channel Impedance Value: 512.5 Ohm
Lead Channel Pacing Threshold Amplitude: 0.75 V
Lead Channel Pacing Threshold Amplitude: 0.75 V
Lead Channel Pacing Threshold Amplitude: 1 V
Lead Channel Pacing Threshold Amplitude: 1 V
Lead Channel Pacing Threshold Pulse Width: 0.5 ms
Lead Channel Pacing Threshold Pulse Width: 0.5 ms
Lead Channel Pacing Threshold Pulse Width: 0.5 ms
Lead Channel Pacing Threshold Pulse Width: 0.5 ms
Lead Channel Sensing Intrinsic Amplitude: 1.6 mV
Lead Channel Sensing Intrinsic Amplitude: 2.7 mV
Lead Channel Setting Pacing Amplitude: 2 V
Lead Channel Setting Pacing Amplitude: 2.5 V
Lead Channel Setting Pacing Pulse Width: 0.5 ms
Lead Channel Setting Sensing Sensitivity: 2 mV
Pulse Gen Model: 2272
Pulse Gen Serial Number: 9182765

## 2022-08-09 NOTE — Patient Instructions (Signed)
Medication Instructions:   Your physician recommends that you continue on your current medications as directed. Please refer to the Current Medication list given to you today.  *If you need a refill on your cardiac medications before your next appointment, please call your pharmacy*   Lab Work:  NONE ORDERED  TODAY   If you have labs (blood work) drawn today and your tests are completely normal, you will receive your results only by: MyChart Message (if you have MyChart) OR A paper copy in the mail If you have any lab test that is abnormal or we need to change your treatment, we will call you to review the results.   Testing/Procedures: NONE ORDERED  TODAY    Follow-Up: At [facility], you and your health needs are our priority.  As part of our continuing mission to provide you with exceptional heart care, we have created designated Provider Care Teams.  These Care Teams include your primary Cardiologist (physician) and Advanced Practice Providers (APPs -  Physician Assistants and Nurse Practitioners) who all work together to provide you with the care you need, when you need it.  We recommend signing up for the patient portal called "MyChart".  Sign up information is provided on this After Visit Summary.  MyChart is used to connect with patients for Virtual Visits (Telemedicine).  Patients are able to view lab/test results, encounter notes, upcoming appointments, etc.  Non-urgent messages can be sent to your provider as well.   To learn more about what you can do with MyChart, go to [email or web address].    Your next appointment:   1 year(s)  Provider:   Will Camnitz, MD    Other Instructions  

## 2022-08-24 ENCOUNTER — Telehealth (INDEPENDENT_AMBULATORY_CARE_PROVIDER_SITE_OTHER): Payer: Self-pay | Admitting: *Deleted

## 2022-08-24 NOTE — Telephone Encounter (Signed)
Due to medical history, will need OV prior to scheduling. Thanks!

## 2022-08-27 ENCOUNTER — Ambulatory Visit (INDEPENDENT_AMBULATORY_CARE_PROVIDER_SITE_OTHER): Payer: Medicare Other

## 2022-08-27 DIAGNOSIS — I442 Atrioventricular block, complete: Secondary | ICD-10-CM

## 2022-08-27 LAB — CUP PACEART REMOTE DEVICE CHECK
Battery Remaining Longevity: 66 mo
Battery Remaining Percentage: 66 %
Battery Voltage: 2.99 V
Brady Statistic AP VP Percent: 1 %
Brady Statistic AP VS Percent: 1 %
Brady Statistic AS VP Percent: 99 %
Brady Statistic AS VS Percent: 1 %
Brady Statistic RA Percent Paced: 1 %
Brady Statistic RV Percent Paced: 99 %
Date Time Interrogation Session: 20240329020013
Implantable Lead Connection Status: 753985
Implantable Lead Connection Status: 753985
Implantable Lead Implant Date: 20201201
Implantable Lead Implant Date: 20201201
Implantable Lead Location: 753859
Implantable Lead Location: 753860
Implantable Pulse Generator Implant Date: 20201201
Lead Channel Impedance Value: 460 Ohm
Lead Channel Impedance Value: 480 Ohm
Lead Channel Pacing Threshold Amplitude: 0.75 V
Lead Channel Pacing Threshold Amplitude: 1 V
Lead Channel Pacing Threshold Pulse Width: 0.5 ms
Lead Channel Pacing Threshold Pulse Width: 0.5 ms
Lead Channel Sensing Intrinsic Amplitude: 11.1 mV
Lead Channel Sensing Intrinsic Amplitude: 3.5 mV
Lead Channel Setting Pacing Amplitude: 2 V
Lead Channel Setting Pacing Amplitude: 2.5 V
Lead Channel Setting Pacing Pulse Width: 0.5 ms
Lead Channel Setting Sensing Sensitivity: 2 mV
Pulse Gen Model: 2272
Pulse Gen Serial Number: 9182765

## 2022-09-01 NOTE — Progress Notes (Unsigned)
Subjective:   Lee [name] is a 73 y.o. male who presents for Medicare Annual/Subsequent preventive examination.  Review of Systems    ***       Objective:    There were no vitals filed for this visit. There is no height or weight on file to calculate BMI.     04/20/2022    8:54 AM 03/03/2022    9:54 AM 02/12/2022    9:49 AM 08/27/2021    8:28 AM 08/25/2021    1:54 PM 08/25/2020   10:55 AM 05/03/2020    5:33 PM  Advanced Directives  Does Patient Have a Medical Advance Directive? Yes Yes Yes Yes Yes Yes No  Type of Paramedic of [place];Living will  [place];Living will Living will     Does patient want to make changes to medical advance directive?      No - Patient declined   Copy of [place] in Chart? Yes - validated most recent copy scanned in chart (See row information)  No - copy requested      Would patient like information on creating a medical advance directive? No - Patient declined          Current Medications (verified) Outpatient Encounter Medications as of 09/02/2022  Medication Sig   Accu-Chek FastClix Lancets MISC Use as directed to check blood sugars twice per day.   ACCU-CHEK GUIDE test strip USE TO CHECK BLOOD SUGAR LEVELS TWICE DAILY.   acetaminophen (TYLENOL) 325 MG tablet Take 1-2 tablets (325-650 mg total) by mouth every 4 (four) hours as needed for mild pain.   amLODipine (NORVASC) 10 MG tablet TAKE 1 TABLET(10 MG) BY MOUTH DAILY   aspirin EC 81 MG tablet Take 81 mg by mouth at bedtime.   Blood Glucose Monitoring Suppl (ACCU-CHEK AVIVA PLUS) w/Device KIT Check BS bid E11.9   cholecalciferol (VITAMIN D) 1000 UNITS tablet Take 1,000 Units by mouth at bedtime.    citalopram (CELEXA) 40 MG tablet TAKE 1 TABLET(40 MG) BY MOUTH DAILY   Coenzyme Q10 (CO Q 10) 100 MG CAPS Take 100 mg by mouth daily with breakfast.    fexofenadine (ALLEGRA) 180 MG tablet Take 180 mg by mouth daily with breakfast.     fluticasone (FLONASE) 50 MCG/ACT nasal spray INSTILL 2 SPRAYS IN THE  NOSE AT BEDTIME   furosemide (LASIX) 40 MG tablet TAKE 1 TABLET BY MOUTH  DAILY   Galcanezumab-gnlm (EMGALITY) 120 MG/ML SOAJ Inject 120 mg into the skin every 30 (thirty) days.   JARDIANCE 25 MG TABS tablet TAKE 1 TABLET BY MOUTH DAILY BEFORE BREAKFAST   Lancets Misc. (ACCU-CHEK FASTCLIX LANCET) KIT USE AS DIRECTED TO CHECK BLOOD SUGAR TWICE DAILY   Multiple Vitamin (MULTIVITAMIN WITH MINERALS) TABS tablet Take 1 tablet by mouth daily with breakfast.   nitroGLYCERIN (NITROSTAT) 0.4 MG SL tablet PLACE ONE TABLET UNDER THE TONGUE EVERY 5 MINUTES AS NEEDED FOR CHEST PAIN   oxyCODONE-acetaminophen (PERCOCET) 10-325 MG tablet Take 1 tablet by mouth every 8 (eight) hours as needed for pain.   pantoprazole (PROTONIX) 40 MG tablet TAKE 1 TABLET BY MOUTH DAILY   Polyvinyl Alcohol-Povidone (REFRESH OP) Place 1 drop into both eyes at bedtime as needed (dry eyes).   potassium chloride (MICRO-K) 10 MEQ CR capsule TAKE 1 CAPSULE BY MOUTH DAILY   Rimegepant Sulfate 75 MG TBDP Take 75 mg by mouth daily as needed (Migraine).   rosuvastatin (CRESTOR) 40 MG tablet TAKE 1  TABLET BY MOUTH DAILY   telmisartan (MICARDIS) 40 MG tablet TAKE 1 TABLET BY MOUTH  DAILY   tirzepatide (MOUNJARO) 10 MG/0.5ML Pen Inject 10 mg into the skin once a week.   No facility-administered encounter medications on file as of 09/02/2022.    Allergies (verified) Ibuprofen, Other, Topamax [topiramate], Toprol xl [metoprolol succinate], and Metoprolol-hctz er   History: Past Medical History:  Diagnosis Date   Allergic rhinitis    chronic sinusitis   Arthritis    osetoarthritis   CAD (coronary artery disease)    a. 2009 PCI/DES to mLAD; b. 05/2010 s/p DES RCA and LAD.; c. 05/2013 Cath: LAD mild ISR, LCX nl, RCA 40p, patent stents.   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)    Depression    Diabetes mellitus type 2, controlled, without complications ([place])    Diabetes mellitus without complication ([place])    Phreesia 08/22/2020   Diverticulosis    DJD (degenerative joint disease)    Esophagitis 1991   grade 1   GERD (gastroesophageal reflux disease)    Hiatal hernia   Hearing loss    wears bilateral hearing aids   Heart murmur    Dr Dennard Schaumann dx per patient   Hemorrhoids    Hyperlipidemia    Hypertension    Hypertensive heart disease    Iron deficiency anemia    Migraines    Myocardial infarction [facility])    Paroxysmal atrial fibrillation ([place])    a. s/p afib ablation 10/22/08 ([facility]).   PVC's (premature ventricular contractions)    Sleep apnea    Uses nightly.  Questionalble: RDI during the total sleep time 6h 28 mins was 3.55/hr during REM sleep was at 5.71/hr. Supine AHI was 5.93/hr.   Syncope 08/2018   Past Surgical History:  Procedure Laterality Date   ABDOMINAL HYSTERECTOMY     CARDIAC CATHETERIZATION  05/31/2010   The proximal LAD was then predilated with 2.0 x 12 trek. this was then stented with a 2.5 x 16 promus Element drug-eluting stent at 14 atmosphere and prostdilated with 2.75 x 12 [place] trek at 16 atmospheres(2.8 mm) resulting the reduction of 80% proximal LAD stenosis to 0% residual with excellent flow.   CARDIAC CATHETERIZATION  06/05/2010   Successful percutaneous coronary intervention to the right coronary artery with percutaneous transluminal coronary angioplasty/stenting and insertion 3.0 x 16 mm Promus DES post dilated to 3.35 mm with stenosis being reduced to 0%   CARDIAC CATHETERIZATION  02/29/2008   Post dilatation was performed using a 2.75 x 9 [place] sprinter, 10 atmospheres for 40 seconds and then 9 atmosphere for 35 sec. This resulted in the 80% area of narrowing pre-intervention, now appearing to normal. There was no edvidence of the dissection or thrombus and there was TIMI III flow pre and post.   CARDIAC  CATHETERIZATION  02/28/2006   CORONARY ANGIOPLASTY WITH STENT PLACEMENT     3 stents/ 4 caths   DISTAL BICEPS TENDON REPAIR Left 02/12/2022   Procedure: LEFT DISTAL BICEPS TENDON REPAIR;  Surgeon: Leandrew Koyanagi, MD;  Location: [place];  Service: Orthopedics;  Laterality: Left;   DISTAL BICEPS TENDON REPAIR Left 03/03/2022   Procedure: LEFT DISTAL BICEPS TENDON REPAIR;  Surgeon: Leandrew Koyanagi, MD;  Location: [place];  Service: Orthopedics;  Laterality: Left;   EYE SURGERY     FINGER  ARTHROPLASTY Right 09/02/2021   Procedure: RIGHT THUMB CARPOMETACARPAL ARTHROPLASTY;  Surgeon: Leandrew Koyanagi, MD;  Location: [place];  Service: Orthopedics;  Laterality: Right;  block in preop   KNEE ARTHROSCOPY     right   LASIK     LEFT HEART CATHETERIZATION WITH CORONARY ANGIOGRAM N/A 06/29/2013   Procedure: LEFT HEART CATHETERIZATION WITH CORONARY ANGIOGRAM;  Surgeon: Leonie Man, MD;  Location: [facility] CATH LAB;  Service: Cardiovascular;  Laterality: N/A;   NASAL SINUS SURGERY  06/01/1999   NECK SURGERY     Cervical fusion   PACEMAKER IMPLANT N/A 05/01/2019   Procedure: PACEMAKER IMPLANT;  Surgeon: [name], MD;  Location: [place] CV LAB;  Service: Cardiovascular;  Laterality: N/A;   RADIOFREQUENCY ABLATION  09/28/2008   atrial fibrillation   ROTATOR CUFF REPAIR     left   SHOULDER OPEN ROTATOR CUFF REPAIR     right   SPINE SURGERY     WRIST ARTHROCENTESIS     left   Family History  Problem Relation Age of Onset   Colon cancer Mother        mets from uterine   Uterine cancer Mother    Heart disease Father        and sister   Diabetes Father    Hearing loss Father    Stroke Father    Heart disease Sister    Lung cancer Sister    Stroke Brother    Social History   Socioeconomic History   Marital status: Married    Spouse name: Katharine Look   Number of children: 2   Years of education: Not on file   Highest education level: Not on file  Occupational History   Occupation:  retired  Tobacco Use   Smoking status: Former    Packs/day: 2.00    Years: 25.00    Additional pack years: 0.00    Total pack years: 50.00    Types: Cigarettes, Cigars    Quit date: 04/22/1988    Years since quitting: 34.3   Smokeless tobacco: Never  Vaping Use   Vaping Use: Never used  Substance and Sexual Activity   Alcohol use: Not Currently   Drug use: No   Sexual activity: Not Currently    Birth control/protection: Surgical    Comment: vas  Other Topics Concern   Not on file  Social History Narrative   Retired Social research officer, 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   Currently works part time for [employer].   2 daughters.    Married x 43 years 09/2021.   Caffiene:  1 cup daily, 2 diet drinks daily. (Caffiene free most time).    Social Determinants of Health   Financial Resource Strain: Low Risk  (08/27/2021)   Overall Financial Resource Strain (CARDIA)    Difficulty of Paying Living Expenses: Not hard at all  Food Insecurity: No Food Insecurity (08/27/2021)   Hunger Vital Sign    Worried About Running Out of Food in the Last Year: Never true    Ran Out of Food in the Last Year: Never true  Transportation Needs: No Transportation Needs (08/27/2021)   PRAPARE - Hydrologist (Medical): No    Lack of Transportation (Non-Medical): No  Physical Activity: Sufficiently Active (08/27/2021)   Exercise Vital Sign    Days of Exercise per Week: 4 days    Minutes of Exercise per Session: 60 min  Stress: No Stress Concern Present (  08/27/2021)   [place] of [place]    Feeling of Stress : Not at all  Social Connections: [place] (08/27/2021)   Social Connection and Isolation Panel [NHANES]    Frequency of Communication with Friends and Family: More than three times a week    Frequency of Social Gatherings with Friends and Family: More than three times a week    Attends Religious Services:  More than 4 times per year    Active Member of [employer] or Organizations: Yes    Attends Music therapist: More than 4 times per year    Marital Status: Married    Tobacco Counseling Counseling given: Not Answered   Clinical Intake:              How often do you need to have someone help you when you read instructions, pamphlets, or other written materials from your doctor or pharmacy?: (P) 1 - Never  Diabetic? Yes   Nutrition Risk Assessment:  Has the patient had any N/V/D within the last 2 months?  {YES/NO:21197} Does the patient have any non-healing wounds?  {YES/NO:21197} Has the patient had any unintentional weight loss or weight gain?  {YES/NO:21197}  Diabetes:  Is the patient diabetic?  {YES/NO:21197} If diabetic, was a CBG obtained today?  {YES/NO:21197} Did the patient bring in their glucometer from home?  {YES/NO:21197} How often do you monitor your CBG's? ***.   Financial Strains and Diabetes Management:  Are you having any financial strains with the device, your supplies or your medication? {YES/NO:21197}.  Does the patient want to be seen by Chronic Care Management for management of their diabetes?  {YES/NO:21197} Would the patient like to be referred to a Nutritionist or for Diabetic Management?  {YES/NO:21197}  Diabetic Exams:  Diabetic Eye Exam: Completed 03/24/22 Diabetic Foot Exam: Completed 03/29/22       Activities of Daily Living    08/29/2022    1:06 PM 02/12/2022    9:49 AM  In your present state of health, do you have any difficulty performing the following activities:  Hearing? 1   Vision? 0   Difficulty concentrating or making decisions? 0   Walking or climbing stairs? 0   Dressing or bathing? 0   Doing errands, shopping? 0 0  Preparing Food and eating ? N   Using the Toilet? N   In the past six months, have you accidently leaked urine? N   Do you have problems with loss of bowel control? N   Managing your  Medications? N   Managing your Finances? N   Housekeeping or managing your Housekeeping? N     Patient Care Team: Susy Frizzle, MD as PCP - General (Family Medicine) Troy Sine, MD as PCP - Cardiology (Cardiology) [name], MD (Inactive) as PCP - Electrophysiology (Cardiology)  Indicate any recent Medical Services you may have received from other than [facility] providers in the past year (date may be approximate).     Assessment:   This is a routine wellness examination for [place].  Hearing/Vision screen No results found.  Dietary issues and exercise activities discussed:     Goals Addressed   None    Depression Screen    06/25/2022    8:35 AM 03/29/2022    9:56 AM 08/27/2021    8:20 AM 08/25/2020   10:30 AM 10/03/2018    9:32 AM 01/10/2018    2:33 PM 09/29/2017    9:37 AM  PHQ  2/9 Scores  PHQ - 2 Score 0 0 0 0 0 0   Exception Documentation       Other- indicate reason in comment box  Not completed       On therapy    Fall Risk    08/29/2022    1:06 PM 06/25/2022    8:35 AM 03/29/2022    9:56 AM 08/27/2021    8:29 AM 08/25/2020   10:30 AM  Fall Risk   Falls in the past year? 0 0 0 1 0  Number falls in past yr:  0 0 0   Injury with Fall?  0 0 1   Risk for fall due to :  No Fall Risks No Fall Risks History of fall(s) No Fall Risks  Follow up  Falls prevention discussed Falls prevention discussed Falls prevention discussed Falls evaluation completed    [place]:  Any stairs in or around the home? {YES/NO:21197} If so, are there any without handrails? {YES/NO:21197} Home free of loose throw rugs in walkways, pet beds, electrical cords, etc? {YES/NO:21197} Adequate lighting in your home to reduce risk of falls? {YES/NO:21197}  ASSISTIVE DEVICES UTILIZED TO PREVENT FALLS:  Life alert? {YES/NO:21197} Use of a cane, walker or w/c? {YES/NO:21197} Grab bars in the bathroom? {YES/NO:21197} Shower chair or bench in shower?  {YES/NO:21197} Elevated toilet seat or a handicapped toilet? {YES/NO:21197}  TIMED UP AND GO:  Was the test performed? {YES/NO:21197}.  Length of time to ambulate 10 feet: *** sec.   {Appearance of [id number]  Cognitive Function:        08/27/2021    8:35 AM  6CIT Screen  What Year? 0 points  What month? 0 points  What time? 0 points  Count back from 20 0 points  Months in reverse 0 points  Repeat phrase 2 points  Total Score 2 points    Immunizations Immunization History  Administered Date(s) Administered   Fluad Quad(high Dose 65+) 03/29/2022   Influenza Split 03/04/2013, 02/28/2014, 03/01/2015, 02/26/2016   Influenza, Seasonal, Injecte, Preservative Fre 03/04/2013, 02/28/2014   Influenza,trivalent, recombinat, inj, PF 03/01/2015, 02/26/2016   Influenza-Unspecified 03/04/2013, 02/28/2014, 03/01/2015, 02/26/2016, 03/14/2021   Moderna Sars-Covid-2 Vaccination 07/14/2019, 08/10/2019   Pneumococcal Conjugate-13 05/29/2015   Pneumococcal Polysaccharide-23 06/14/2004, 09/17/2016   Pneumococcal-Unspecified 06/14/2004, 09/17/2016   Tdap 03/30/2012, 12/07/2018   Zoster Recombinat (Shingrix) 12/30/2020, 04/18/2021   Zoster, Live 03/30/2012    TDAP status: Up to date  Flu Vaccine status: Up to date  Pneumococcal vaccine status: Up to date  Covid-19 vaccine status: Information provided on how to obtain vaccines.   Qualifies for Shingles Vaccine? Yes   Zostavax completed Yes   Shingrix Completed?: Yes  Screening Tests Health Maintenance  Topic Date Due   COVID-19 Vaccine (3 - Moderna risk series) 09/07/2019   COLONOSCOPY (Pts 45-22yrs Insurance coverage will need to be confirmed)  06/20/2022   HEMOGLOBIN A1C  12/21/2022   INFLUENZA VACCINE  12/30/2022   OPHTHALMOLOGY EXAM  03/25/2023   FOOT EXAM  03/30/2023   Diabetic kidney evaluation - eGFR measurement  06/23/2023   Diabetic kidney evaluation - Urine ACR  06/26/2023   Medicare Annual Wellness (AWV)   06/26/2023   DTaP/Tdap/Td (3 - Td or Tdap) 12/06/2028   Pneumonia Vaccine 49+ Years old  Completed   Zoster Vaccines- Shingrix  Completed   HPV VACCINES  Aged Out   Hepatitis C Screening  Discontinued    Health Maintenance  Health Maintenance Due  Topic  Date Due   COVID-19 Vaccine (3 - Moderna risk series) 09/07/2019   COLONOSCOPY (Pts 45-57yrs Insurance coverage will need to be confirmed)  06/20/2022    Colorectal cancer screening: Type of screening: Colonoscopy. Completed 06/20/12. Repeat every 10 years  Lung Cancer Screening: (Low Dose CT Chest recommended if Age 47-80 years, 30 pack-year currently smoking OR have quit w/in 15years.) does not qualify.   Lung Cancer Screening Referral: n/a  Additional Screening:  Hepatitis C Screening: does not qualify  Vision Screening: Recommended annual ophthalmology exams for early detection of glaucoma and other disorders of the eye. Is the patient up to date with their annual eye exam?  {YES/NO:21197} Who is the provider or what is the name of the office in which the patient attends annual eye exams? *** If pt is not established with a provider, would they like to be referred to a provider to establish care? {YES/NO:21197}.   Dental Screening: Recommended annual dental exams for proper oral hygiene  Community Resource Referral / Chronic Care Management: CRR required this visit?  {YES/NO:21197}  CCM required this visit?  {YES/NO:21197}     Plan:     I have personally reviewed and noted the following in the patient's chart:   Medical and social history Use of alcohol, tobacco or illicit drugs  Current medications and supplements including opioid prescriptions. {Opioid [phone]} Functional ability and status Nutritional status Physical activity Advanced directives List of other physicians Hospitalizations, surgeries, and ER visits in previous 12 months Vitals Screenings to include cognitive, depression, and  falls Referrals and appointments  In addition, I have reviewed and discussed with patient certain preventive protocols, quality metrics, and best practice recommendations. A written personalized care plan for preventive services as well as general preventive health recommendations were provided to patient.     Denman George [place], [place]   [id number]   Nurse Notes: ***

## 2022-09-01 NOTE — Patient Instructions (Incomplete)
Lee [name] , Thank you for taking time to come for your Medicare Wellness Visit. I appreciate your ongoing commitment to your health goals. Please review the following plan we discussed and let me know if I can assist you in the future.   These are the goals we discussed:  Goals      Exercise 3x per week (30 min per time)     Continue to exercise and stay healthy. Weight loss goal of 190.         This is a list of the screening recommended for you and due dates:  Health Maintenance  Topic Date Due   COVID-19 Vaccine (3 - Moderna risk series) 09/07/2019   Colon Cancer Screening  06/20/2022   Hemoglobin A1C  12/21/2022   Flu Shot  12/30/2022   Eye exam for diabetics  03/25/2023   Complete foot exam   03/30/2023   Yearly kidney function blood test for diabetes  06/23/2023   Yearly kidney health urinalysis for diabetes  06/26/2023   Medicare Annual Wellness Visit  06/26/2023   DTaP/Tdap/Td vaccine (3 - Td or Tdap) 12/06/2028   Pneumonia Vaccine  Completed   Zoster (Shingles) Vaccine  Completed   HPV Vaccine  Aged Out   Hepatitis C Screening: USPSTF Recommendation to screen - Ages 18-79 yo.  Discontinued    Advanced directives: Please bring a copy of your health care power of attorney and living will to the office to be added to your chart at your convenience.   Conditions/risks identified: Aim for 30 minutes of exercise or brisk walking, 6-8 glasses of water, and 5 servings of fruits and vegetables each day.   Next appointment: Follow up in one year for your annual wellness visit.   I will be in touch with what Dr. Dennard Schaumann says in regards to the [facility] and adding a sleeping medication.  I will also refill your Amlodipine and Citalopram to the local pharmacy.   Preventive Care 21 Years and Older, Male  Preventive care refers to lifestyle choices and visits with your health care provider that can promote health and wellness. What does preventive care include? A yearly  physical exam. This is also called an annual well check. Dental exams once or twice a year. Routine eye exams. Ask your health care provider how often you should have your eyes checked. Personal lifestyle choices, including: Daily care of your teeth and gums. Regular physical activity. Eating a healthy diet. Avoiding tobacco and drug use. Limiting alcohol use. Practicing safe sex. Taking low doses of aspirin every day. Taking vitamin and mineral supplements as recommended by your health care provider. What happens during an annual well check? The services and screenings done by your health care provider during your annual well check will depend on your age, overall health, lifestyle risk factors, and family history of disease. Counseling  Your health care provider may ask you questions about your: Alcohol use. Tobacco use. Drug use. Emotional well-being. Home and relationship well-being. Sexual activity. Eating habits. History of falls. Memory and ability to understand (cognition). Work and work Statistician. Screening  You may have the following tests or measurements: Height, weight, and BMI. Blood pressure. Lipid and cholesterol levels. These may be checked every 5 years, or more frequently if you are over 27 years old. Skin check. Lung cancer screening. You may have this screening every year starting at age 41 if you have a 30-pack-year history of smoking and currently smoke or have quit within the past  15 years. Fecal occult blood test (FOBT) of the stool. You may have this test every year starting at age 66. Flexible sigmoidoscopy or colonoscopy. You may have a sigmoidoscopy every 5 years or a colonoscopy every 10 years starting at age 64. Prostate cancer screening. Recommendations will vary depending on your family history and other risks. Hepatitis C blood test. Hepatitis B blood test. Sexually transmitted disease (STD) testing. Diabetes screening. This is done by  checking your blood sugar (glucose) after you have not eaten for a while (fasting). You may have this done every 1-3 years. Abdominal aortic aneurysm (AAA) screening. You may need this if you are a current or former smoker. Osteoporosis. You may be screened starting at age 38 if you are at high risk. Talk with your health care provider about your test results, treatment options, and if necessary, the need for more tests. Vaccines  Your health care provider may recommend certain vaccines, such as: Influenza vaccine. This is recommended every year. Tetanus, diphtheria, and acellular pertussis (Tdap, Td) vaccine. You may need a Td booster every 10 years. Zoster vaccine. You may need this after age 34. Pneumococcal 13-valent conjugate (PCV13) vaccine. One dose is recommended after age 72. Pneumococcal polysaccharide (PPSV23) vaccine. One dose is recommended after age 58. Talk to your health care provider about which screenings and vaccines you need and how often you need them. This information is not intended to replace advice given to you by your health care provider. Make sure you discuss any questions you have with your health care provider. Document Released: 06/13/2015 Document Revised: 02/04/2016 Document Reviewed: 03/18/2015 Elsevier Interactive Patient Education  2017 [place] Prevention in the Home Falls can cause injuries. They can happen to people of all ages. There are many things you can do to make your home safe and to help prevent falls. What can I do on the outside of my home? Regularly fix the edges of walkways and driveways and fix any cracks. Remove anything that might make you trip as you walk through a door, such as a raised step or threshold. Trim any bushes or trees on the path to your home. Use bright outdoor lighting. Clear any walking paths of anything that might make someone trip, such as rocks or tools. Regularly check to see if handrails are loose or  broken. Make sure that both sides of any steps have handrails. Any raised decks and porches should have guardrails on the edges. Have any leaves, snow, or ice cleared regularly. Use sand or salt on walking paths during winter. Clean up any spills in your garage right away. This includes oil or grease spills. What can I do in the bathroom? Use night lights. Install grab bars by the toilet and in the tub and shower. Do not use towel bars as grab bars. Use non-skid mats or decals in the tub or shower. If you need to sit down in the shower, use a plastic, non-slip stool. Keep the floor dry. Clean up any water that spills on the floor as soon as it happens. Remove soap buildup in the tub or shower regularly. Attach bath mats securely with double-sided non-slip rug tape. Do not have throw rugs and other things on the floor that can make you trip. What can I do in the bedroom? Use night lights. Make sure that you have a light by your bed that is easy to reach. Do not use any sheets or blankets that are too big for your  bed. They should not hang down onto the floor. Have a firm chair that has side arms. You can use this for support while you get dressed. Do not have throw rugs and other things on the floor that can make you trip. What can I do in the kitchen? Clean up any spills right away. Avoid walking on wet floors. Keep items that you use a lot in easy-to-reach places. If you need to reach something above you, use a strong step stool that has a grab bar. Keep electrical cords out of the way. Do not use floor polish or wax that makes floors slippery. If you must use wax, use non-skid floor wax. Do not have throw rugs and other things on the floor that can make you trip. What can I do with my stairs? Do not leave any items on the stairs. Make sure that there are handrails on both sides of the stairs and use them. Fix handrails that are broken or loose. Make sure that handrails are as long as  the stairways. Check any carpeting to make sure that it is firmly attached to the stairs. Fix any carpet that is loose or worn. Avoid having throw rugs at the top or bottom of the stairs. If you do have throw rugs, attach them to the floor with carpet tape. Make sure that you have a light switch at the top of the stairs and the bottom of the stairs. If you do not have them, ask someone to add them for you. What else can I do to help prevent falls? Wear shoes that: Do not have high heels. Have rubber bottoms. Are comfortable and fit you well. Are closed at the toe. Do not wear sandals. If you use a stepladder: Make sure that it is fully opened. Do not climb a closed stepladder. Make sure that both sides of the stepladder are locked into place. Ask someone to hold it for you, if possible. Clearly mark and make sure that you can see: Any grab bars or handrails. First and last steps. Where the edge of each step is. Use tools that help you move around (mobility aids) if they are needed. These include: Canes. Walkers. Scooters. Crutches. Turn on the lights when you go into a dark area. Replace any light bulbs as soon as they burn out. Set up your furniture so you have a clear path. Avoid moving your furniture around. If any of your floors are uneven, fix them. If there are any pets around you, be aware of where they are. Review your medicines with your doctor. Some medicines can make you feel dizzy. This can increase your chance of falling. Ask your doctor what other things that you can do to help prevent falls. This information is not intended to replace advice given to you by your health care provider. Make sure you discuss any questions you have with your health care provider. Document Released: 03/13/2009 Document Revised: 10/23/2015 Document Reviewed: 06/21/2014 Elsevier Interactive Patient Education  2017 [employer].

## 2022-09-02 ENCOUNTER — Ambulatory Visit (INDEPENDENT_AMBULATORY_CARE_PROVIDER_SITE_OTHER): Payer: Medicare Other

## 2022-09-02 ENCOUNTER — Telehealth: Payer: Self-pay

## 2022-09-02 VITALS — BP 122/74 | Ht 68.0 in | Wt 208.2 lb

## 2022-09-02 DIAGNOSIS — G4700 Insomnia, unspecified: Secondary | ICD-10-CM

## 2022-09-02 DIAGNOSIS — Z0000 Encounter for general adult medical examination without abnormal findings: Secondary | ICD-10-CM

## 2022-09-02 MED ORDER — CITALOPRAM HYDROBROMIDE 40 MG PO TABS
40.0000 mg | ORAL_TABLET | Freq: Every day | ORAL | 1 refills | Status: DC
  Filled 2022-11-26: qty 90, 90d supply, fill #0

## 2022-09-02 MED ORDER — AMLODIPINE BESYLATE 10 MG PO TABS
10.0000 mg | ORAL_TABLET | Freq: Every day | ORAL | 1 refills | Status: DC
  Filled 2022-11-26 – 2022-12-14 (×2): qty 90, 90d supply, fill #0

## 2022-09-02 NOTE — Telephone Encounter (Signed)
Patient seen for AWV and is asking if does of Darcel Bayley will be increased with next rx, he is having no problems with medication. Also states that he has trouble sleeping, is averaging about 5 hours a night and does not nap during the day. Has been on Ambien previously but would like something different.

## 2022-09-03 ENCOUNTER — Ambulatory Visit (HOSPITAL_COMMUNITY): Payer: Medicare Other | Attending: Cardiology

## 2022-09-03 ENCOUNTER — Other Ambulatory Visit: Payer: Self-pay | Admitting: Family Medicine

## 2022-09-03 ENCOUNTER — Ambulatory Visit: Payer: Medicare Other | Admitting: Internal Medicine

## 2022-09-03 DIAGNOSIS — I350 Nonrheumatic aortic (valve) stenosis: Secondary | ICD-10-CM | POA: Diagnosis not present

## 2022-09-03 LAB — ECHOCARDIOGRAM COMPLETE
AR max vel: 1.37 cm2
AV Area VTI: 1.34 cm2
AV Area mean vel: 1.23 cm2
AV Mean grad: 16.2 mmHg
AV Peak grad: 26.1 mmHg
Ao pk vel: 2.55 m/s
Area-P 1/2: 2.97 cm2
S' Lateral: 2.6 cm

## 2022-09-03 NOTE — Telephone Encounter (Signed)
Unable to refill per protocol, Rx request is too soon. Last refill 09/02/22 for 90 days.E-Prescribing Status: Receipt confirmed by pharmacy (09/02/2022  9:10 AM EDT).  Requested Prescriptions  Pending Prescriptions Disp Refills   citalopram (CELEXA) 40 MG tablet [Pharmacy Med Name: CITALOPRAM 40MG  TABLETS] 90 tablet 1    Sig: TAKE 1 TABLET(40 MG) BY MOUTH DAILY     Psychiatry:  Antidepressants - SSRI Failed - 09/03/2022  7:03 AM      Failed - Valid encounter within last 6 months    Recent Outpatient Visits           11 months ago Coronary artery disease involving native coronary artery of native heart without angina pectoris   [facility] Donita Brooks, MD   1 year ago Type 2 diabetes mellitus with complication, without long-term current use of insulin (HCC)   [facility] Pickard, Priscille Heidelberg, MD   1 year ago Wrist pain, chronic, right   [facility] Pickard, Priscille Heidelberg, MD   2 years ago General medical exam   [facility] Donita Brooks, MD   2 years ago Type 2 diabetes mellitus with complication, without long-term current use of insulin (HCC)   [facility] Pickard, Priscille Heidelberg, MD       Future Appointments             In 4 months Lennette Bihari, MD [facility]   In 9 months Pickard, Priscille Heidelberg, MD [facility]            Passed - Completed PHQ-2 or PHQ-9 in the last 360 days

## 2022-09-06 MED ORDER — HYDROXYZINE PAMOATE 25 MG PO CAPS: 25.0000 mg | ORAL_CAPSULE | Freq: Every evening | ORAL | 1 refills | Status: DC | PRN

## 2022-09-06 NOTE — Telephone Encounter (Signed)
Noted.  Patient aware and medication sent.

## 2022-09-07 ENCOUNTER — Encounter (INDEPENDENT_AMBULATORY_CARE_PROVIDER_SITE_OTHER): Payer: Self-pay | Admitting: Gastroenterology

## 2022-09-07 ENCOUNTER — Ambulatory Visit (INDEPENDENT_AMBULATORY_CARE_PROVIDER_SITE_OTHER): Payer: Medicare Other | Admitting: Gastroenterology

## 2022-09-07 ENCOUNTER — Encounter: Payer: Self-pay | Admitting: Gastroenterology

## 2022-09-07 VITALS — BP 124/83 | HR 84 | Temp 97.7°F | Ht 68.0 in | Wt 208.2 lb

## 2022-09-07 DIAGNOSIS — E119 Type 2 diabetes mellitus without complications: Secondary | ICD-10-CM

## 2022-09-07 DIAGNOSIS — Z794 Long term (current) use of insulin: Secondary | ICD-10-CM

## 2022-09-07 DIAGNOSIS — Z1211 Encounter for screening for malignant neoplasm of colon: Secondary | ICD-10-CM | POA: Diagnosis not present

## 2022-09-07 DIAGNOSIS — K219 Gastro-esophageal reflux disease without esophagitis: Secondary | ICD-10-CM

## 2022-09-07 DIAGNOSIS — Z950 Presence of cardiac pacemaker: Secondary | ICD-10-CM

## 2022-09-07 NOTE — Patient Instructions (Addendum)
We are getting you scheduled for a colonoscopy in the near future with Dr. Levon Hedger.  You will receive separate detailed written instructions regarding your prep.  Congratulations on your weight loss!  It was a pleasure to see you today. I want to create trusting relationships with patients. If you receive a survey regarding your visit,  I greatly appreciate you taking time to fill this out on paper or through your MyChart. I value your feedback.  Brooke Bonito, MSN, FNP-BC, AGACNP-BC [facility]

## 2022-09-07 NOTE — Progress Notes (Addendum)
GI Office Note    Referring Provider: Donita BrooksPickard, Warren T, MD Primary Care Physician:  Lee BrooksPickard, Warren T, MD  Primary Gastroenterologist: Lee Frameaniel Castaneda-Mayorga, MD  Chief Complaint   Chief Complaint  Patient presents with   Colon Cancer Screening    Needed office visit to schedule colonoscopy due to medical history. Patient last TCS was 06/20/12 - Lee. Russella [name].    History of Present Illness   Lee [name] is a 72 y.o. male presenting today at the request of Lee BrooksPickard, Warren T, MD for colonoscopy.  Last colonoscopy 2014 by Lee. Russella [name]: -Mild diverticulosis noted in the descending and sigmoid colon -Advised repeat in 10 years.  Today: No chest pain or shortness of breath. Does not use inhalers. No swelling to legs or feet. Tries to walk about 3 times per week and has a membership to [employer]. He works part time for [employer]. Does not sit a lot.   Was on Trulicity previously to being on Mounjaro.   Reports GERD is well controlled on Protonix once daily. Occasional will take 2 if needed for breakthrough.   Has lost 46lbs in the last 6 -8 months.   Had a pacemaker placed in December 2020. Had had 2 syncopal episodes in the yard and went to the ED just prior to that.   He denies any nausea, vomiting, lack appetite, dysphagia, constipation, diarrhea, melena, regular, unintentional weight loss, lack of appetite, early satiety, abdominal pain.  Most recent labs reviewed from January 2024.  Normal renal function.   Current Outpatient Medications  Medication Sig Dispense Refill   Accu-Chek FastClix Lancets MISC Use as directed to check blood sugars twice per day. 100 each 5   ACCU-CHEK GUIDE test strip USE TO CHECK BLOOD SUGAR LEVELS TWICE DAILY. 100 strip 1   acetaminophen (TYLENOL) 325 MG tablet Take 1-2 tablets (325-650 mg total) by mouth every 4 (four) hours as needed for mild pain.     amLODipine (NORVASC) 10 MG tablet TAKE 1 TABLET(10 MG) BY MOUTH DAILY 90 tablet 1    aspirin EC 81 MG tablet Take 81 mg by mouth at bedtime.     Blood Glucose Monitoring Suppl (ACCU-CHEK AVIVA PLUS) w/Device KIT Check BS bid E11.9 1 kit 1   cholecalciferol (VITAMIN D) 1000 UNITS tablet Take 1,000 Units by mouth at bedtime.      citalopram (CELEXA) 40 MG tablet TAKE 1 TABLET(40 MG) BY MOUTH DAILY 90 tablet 1   Coenzyme Q10 (CO Q 10) 100 MG CAPS Take 100 mg by mouth daily with breakfast.      fexofenadine (ALLEGRA) 180 MG tablet Take 180 mg by mouth daily with breakfast.      fluticasone (FLONASE) 50 MCG/ACT nasal spray INSTILL 2 SPRAYS IN THE  NOSE AT BEDTIME 48 g 3   furosemide (LASIX) 40 MG tablet TAKE 1 TABLET BY MOUTH  DAILY 90 tablet 3   Galcanezumab-gnlm (EMGALITY) 120 MG/ML SOAJ Inject 120 mg into the skin every 30 (thirty) days. 1.12 mL 11   hydrOXYzine (VISTARIL) 25 MG capsule Take 1 capsule (25 mg total) by mouth at bedtime as needed. 30 capsule 1   JARDIANCE 25 MG TABS tablet TAKE 1 TABLET BY MOUTH DAILY BEFORE BREAKFAST 90 tablet 1   Lancets Misc. (ACCU-CHEK FASTCLIX LANCET) KIT USE AS DIRECTED TO CHECK BLOOD SUGAR TWICE DAILY 1 kit 1   Multiple Vitamin (MULTIVITAMIN WITH MINERALS) TABS tablet Take 1 tablet by mouth daily with breakfast.     nitroGLYCERIN (NITROSTAT)  0.4 MG SL tablet PLACE ONE TABLET UNDER THE TONGUE EVERY 5 MINUTES AS NEEDED FOR CHEST PAIN 25 tablet 3   oxyCODONE-acetaminophen (PERCOCET) 10-325 MG tablet Take 1 tablet by mouth every 8 (eight) hours as needed for pain. 21 tablet 0   pantoprazole (PROTONIX) 40 MG tablet TAKE 1 TABLET BY MOUTH DAILY 90 tablet 3   Polyvinyl Alcohol-Povidone (REFRESH OP) Place 1 drop into both eyes at bedtime as needed (dry eyes).     potassium chloride (MICRO-K) 10 MEQ CR capsule TAKE 1 CAPSULE BY MOUTH DAILY 90 capsule 3   Rimegepant Sulfate 75 MG TBDP Take 75 mg by mouth daily as needed (Migraine). 16 tablet 11   rosuvastatin (CRESTOR) 40 MG tablet TAKE 1 TABLET BY MOUTH DAILY 90 tablet 3   telmisartan (MICARDIS) 40 MG  tablet TAKE 1 TABLET BY MOUTH  DAILY 90 tablet 3   tirzepatide (MOUNJARO) 10 MG/0.5ML Pen Inject 10 mg into the skin once a week. 6 mL 1   No current facility-administered medications for this visit.    Past Medical History:  Diagnosis Date   Allergic rhinitis    chronic sinusitis   Arthritis    osetoarthritis   CAD (coronary artery disease)    a. 2009 PCI/DES to mLAD; b. 05/2010 s/p DES RCA and LAD.; c. 05/2013 Cath: LAD mild ISR, LCX nl, RCA 40p, patent stents.   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   Cataract    has had lasik surg   COPD (chronic obstructive pulmonary disease)    Depression    Diabetes mellitus type 2, controlled, without complications    Diabetes mellitus without complication    Phreesia 08/22/2020   Diverticulosis    DJD (degenerative joint disease)    Esophagitis 1991   grade 1   GERD (gastroesophageal reflux disease)    Hiatal hernia   Hearing loss    wears bilateral hearing aids   Heart murmur    Lee [name] dx per patient   Hemorrhoids    Hyperlipidemia    Hypertension    Hypertensive heart disease    Iron deficiency anemia    Migraines    Myocardial infarction    Paroxysmal atrial fibrillation    a. s/p afib ablation 10/22/08 (Lee [name]).   PVC's (premature ventricular contractions)    Sleep apnea    Uses nightly.  Questionalble: RDI during the total sleep time 6h 28 mins was 3.55/hr during REM sleep was at 5.71/hr. Supine AHI was 5.93/hr.   Syncope 08/2018    Past Surgical History:  Procedure Laterality Date   ABDOMINAL HYSTERECTOMY     CARDIAC CATHETERIZATION  05/31/2010   The proximal LAD was then predilated with 2.0 x 12 trek. this was then stented with a 2.5 x 16 promus Element drug-eluting stent at 14 atmosphere and prostdilated with 2.75 x 12 [place] trek at 16 atmospheres(2.8 mm) resulting the reduction of 80% proximal LAD stenosis to 0% residual with excellent flow.   CARDIAC CATHETERIZATION  06/05/2010   Successful  percutaneous coronary intervention to the right coronary artery with percutaneous transluminal coronary angioplasty/stenting and insertion 3.0 x 16 mm Promus DES post dilated to 3.35 mm with stenosis being reduced to 0%   CARDIAC CATHETERIZATION  02/29/2008   Post dilatation was performed using a 2.75 x 9 [place] sprinter, 10 atmospheres for 40 seconds and then 9 atmosphere for 35 sec. This resulted in the 80% area of narrowing pre-intervention, now appearing to normal.  There was no edvidence of the dissection or thrombus and there was TIMI III flow pre and post.   CARDIAC CATHETERIZATION  02/28/2006   CORONARY ANGIOPLASTY WITH STENT PLACEMENT     3 stents/ 4 caths   DISTAL BICEPS TENDON REPAIR Left 02/12/2022   Procedure: LEFT DISTAL BICEPS TENDON REPAIR;  Surgeon: Tarry Kos, MD;  Location: [facility] OR;  Service: Orthopedics;  Laterality: Left;   DISTAL BICEPS TENDON REPAIR Left 03/03/2022   Procedure: LEFT DISTAL BICEPS TENDON REPAIR;  Surgeon: Tarry Kos, MD;  Location: [facility] OR;  Service: Orthopedics;  Laterality: Left;   EYE SURGERY     FINGER ARTHROPLASTY Right 09/02/2021   Procedure: RIGHT THUMB CARPOMETACARPAL ARTHROPLASTY;  Surgeon: Tarry Kos, MD;  Location: [facility];  Service: Orthopedics;  Laterality: Right;  block in preop   KNEE ARTHROSCOPY     right   LASIK     LEFT HEART CATHETERIZATION WITH CORONARY ANGIOGRAM N/A 06/29/2013   Procedure: LEFT HEART CATHETERIZATION WITH CORONARY ANGIOGRAM;  Surgeon: Marykay Lex, MD;  Location: [facility] CATH LAB;  Service: Cardiovascular;  Laterality: N/A;   NASAL SINUS SURGERY  06/01/1999   NECK SURGERY     Cervical fusion   PACEMAKER IMPLANT N/A 05/01/2019   Procedure: PACEMAKER IMPLANT;  Surgeon: Hillis Range, MD;  Location: [facility] INVASIVE CV LAB;  Service: Cardiovascular;  Laterality: N/A;   RADIOFREQUENCY ABLATION  09/28/2008   atrial fibrillation   ROTATOR CUFF REPAIR     left   SHOULDER OPEN ROTATOR CUFF REPAIR     right   SPINE  SURGERY     WRIST ARTHROCENTESIS     left    Family History  Problem Relation Age of Onset   Colon cancer Mother        mets from uterine   Uterine cancer Mother    Heart disease Father        and sister   Diabetes Father    Hearing loss Father    Stroke Father    Heart disease Sister    Lung cancer Sister    Stroke Brother     Allergies as of 09/07/2022 - Review Complete 09/07/2022  Allergen Reaction Noted   Ibuprofen Swelling 07/18/2012   Other Shortness Of Breath 07/18/2012   Topamax [topiramate] Other (See Comments) 09/11/2015   Toprol xl [metoprolol succinate] Other (See Comments) 08/27/2010   Metoprolol-hctz er Other (See Comments) 08/29/2017    Social History   Socioeconomic History   Marital status: Married    Spouse name: Dois Davenport   Number of children: 2   Years of education: Not on file   Highest education level: Not on file  Occupational History   Occupation: retired  Tobacco Use   Smoking status: Former    Packs/day: 2.00    Years: 25.00    Additional pack years: 0.00    Total pack years: 50.00    Types: Cigarettes, Cigars    Quit date: 04/22/1988    Years since quitting: 34.4    Passive exposure: Past   Smokeless tobacco: Never  Vaping Use   Vaping Use: Never used  Substance and Sexual Activity   Alcohol use: Not Currently   Drug use: No   Sexual activity: Not Currently    Birth control/protection: Surgical    Comment: vas  Other Topics Concern   Not on file  Social History Narrative   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   Currently works part time  for [employer].   2 daughters.    Married x 43 years 09/2021.   Caffiene:  1 cup daily, 2 diet drinks daily. (Caffiene free most time).    Social Determinants of Health   Financial Resource Strain: Low Risk  (09/02/2022)   Overall Financial Resource Strain (CARDIA)    Difficulty of Paying Living Expenses: Not hard at all  Food Insecurity: No Food Insecurity (09/02/2022)    Hunger Vital Sign    Worried About Running Out of Food in the Last Year: Never true    Ran Out of Food in the Last Year: Never true  Transportation Needs: No Transportation Needs (09/02/2022)   PRAPARE - Administrator, Civil Service (Medical): No    Lack of Transportation (Non-Medical): No  Physical Activity: Insufficiently Active (09/02/2022)   Exercise Vital Sign    Days of Exercise per Week: 3 days    Minutes of Exercise per Session: 40 min  Stress: No Stress Concern Present (09/02/2022)   [employer] of Occupational Health - Occupational Stress Questionnaire    Feeling of Stress : Not at all  Social Connections: Socially Integrated (09/02/2022)   Social Connection and Isolation Panel [NHANES]    Frequency of Communication with Friends and Family: More than three times a week    Frequency of Social Gatherings with Friends and Family: Once a week    Attends Religious Services: More than 4 times per year    Active Member of [employer] or Organizations: Yes    Attends Engineer, structural: More than 4 times per year    Marital Status: Married  Catering manager Violence: Not At Risk (09/02/2022)   Humiliation, Afraid, Rape, and Kick questionnaire    Fear of Current or Ex-Partner: No    Emotionally Abused: No    Physically Abused: No    Sexually Abused: No     Review of Systems   Gen: Denies any fever, chills, fatigue, weight loss, lack of appetite.  CV: Denies chest pain, heart palpitations, peripheral edema, syncope.  Resp: Denies shortness of breath at rest or with exertion. Denies wheezing or cough.  GI: see HPI GU : Denies urinary burning, urinary frequency, urinary hesitancy MS: Denies joint pain, muscle weakness, cramps, or limitation of movement.  Derm: Denies rash, itching, dry skin Psych: Denies depression, anxiety, memory loss, and confusion Heme: Denies bruising, bleeding, and enlarged lymph nodes.   Physical Exam   BP 124/83 (BP Location: Left  Arm, Patient Position: Sitting, Cuff Size: Large)   Pulse 84   Temp 97.7 F (36.5 C) (Oral)   Ht 5\' 8"  (1.727 m)   Wt 208 lb 3.2 oz (94.4 kg)   BMI 31.66 kg/m   General:   Alert and oriented. Pleasant and cooperative. Well-nourished and well-developed.  Head:  Normocephalic and atraumatic. Eyes:  Without icterus, sclera clear and conjunctiva pink.  Ears:  Normal auditory acuity. Mouth:  No deformity or lesions, oral mucosa pink.  Lungs:  Clear to auscultation bilaterally. No wheezes, rales, or rhonchi. No distress.  Heart:  S1, S2 present without murmurs appreciated.  Abdomen:  +BS, soft, non-tender and non-distended. No HSM noted. No guarding or rebound. No masses appreciated.  Rectal:  Deferred  Msk:  Symmetrical without gross deformities. Normal posture. Extremities:  Without edema. Neurologic:  Alert and  oriented x4;  grossly normal neurologically. Skin:  Intact without significant lesions or rashes. Psych:  Alert and cooperative. Normal mood and affect.  Assessment   Lee [name] is a 72 y.o. male with a history of HTN, HLD, IDA, GERD, diverticulosis, type 2 diabetes, COPD, CAD s/p PCI with this in 2009, A-fib, MI presenting today for evaluation prior to scheduling screening colonoscopy.  Screening for colon cancer: Colonoscopy 2014 normal.  Advised repeat in 10 years.  Currently due.  No alarm symptoms present.  No changes in bowel habits, lack of appetite, early satiety, unintentional weight loss, nausea, vomiting.  GERD controlled with pantoprazole.  Will proceed with screening colonoscopy in the near future.  We discussed necessary medication adjustments.  PLAN   Proceed with upper endoscopy with propofol by Lee. Levon Hedger in near future: the risks, benefits, and alternatives have been discussed with the patient in detail. The patient states understanding and desires to proceed. ASA 3 (patient has pacemaker in place) Hold Mounjaro for 1 week Hold Jardiance for 3  days Okay for low volume prep per patient request.  Clenpiq or Suprep are okay.   Brooke Bonito, MSN, FNP-BC, AGACNP-BC [facility]

## 2022-09-08 ENCOUNTER — Encounter: Payer: Self-pay | Admitting: Gastroenterology

## 2022-09-08 ENCOUNTER — Telehealth: Payer: Self-pay

## 2022-09-08 NOTE — Telephone Encounter (Signed)
Spoke with pt. Pt was notified of echo results. Pt will continue his current medication and f/u as planned.  

## 2022-09-12 ENCOUNTER — Encounter: Payer: Self-pay | Admitting: Family Medicine

## 2022-09-14 ENCOUNTER — Other Ambulatory Visit: Payer: Self-pay | Admitting: Family Medicine

## 2022-09-14 MED ORDER — TIRZEPATIDE 12.5 MG/0.5ML ~~LOC~~ SOAJ
12.5000 mg | SUBCUTANEOUS | 2 refills | Status: DC
  Filled 2022-10-07: qty 6, 84d supply, fill #0
  Filled 2022-12-21: qty 6, 84d supply, fill #1
  Filled 2023-03-20: qty 6, 84d supply, fill #2

## 2022-09-22 ENCOUNTER — Telehealth (INDEPENDENT_AMBULATORY_CARE_PROVIDER_SITE_OTHER): Payer: Self-pay | Admitting: Gastroenterology

## 2022-09-22 NOTE — Telephone Encounter (Signed)
Pt contacted with pre op appt information  

## 2022-09-28 NOTE — Progress Notes (Signed)
Remote pacemaker transmission.   

## 2022-09-29 ENCOUNTER — Encounter (HOSPITAL_COMMUNITY)
Admission: RE | Admit: 2022-09-29 | Discharge: 2022-09-29 | Disposition: A | Discharge status: Home / Self Care | Payer: Medicare Other | Source: Ambulatory Visit | Attending: Gastroenterology | Admitting: Gastroenterology

## 2022-10-01 ENCOUNTER — Other Ambulatory Visit: Payer: Self-pay

## 2022-10-01 ENCOUNTER — Encounter (HOSPITAL_COMMUNITY)
Admission: RE | Disposition: A | Discharge status: Home / Self Care | Payer: Self-pay | Source: Home / Self Care | Attending: Gastroenterology

## 2022-10-01 ENCOUNTER — Ambulatory Visit (HOSPITAL_BASED_OUTPATIENT_CLINIC_OR_DEPARTMENT_OTHER): Payer: Medicare Other

## 2022-10-01 ENCOUNTER — Ambulatory Visit (HOSPITAL_COMMUNITY)
Admission: RE | Admit: 2022-10-01 | Discharge: 2022-10-01 | Disposition: A | Discharge status: Home / Self Care | Payer: Medicare Other | Attending: Gastroenterology | Admitting: Gastroenterology

## 2022-10-01 ENCOUNTER — Ambulatory Visit (HOSPITAL_COMMUNITY): Payer: Medicare Other

## 2022-10-01 ENCOUNTER — Encounter (HOSPITAL_COMMUNITY): Payer: Self-pay | Admitting: Gastroenterology

## 2022-10-01 DIAGNOSIS — J449 Chronic obstructive pulmonary disease, unspecified: Secondary | ICD-10-CM

## 2022-10-01 DIAGNOSIS — Z87891 Personal history of nicotine dependence: Secondary | ICD-10-CM | POA: Diagnosis not present

## 2022-10-01 DIAGNOSIS — K219 Gastro-esophageal reflux disease without esophagitis: Secondary | ICD-10-CM | POA: Diagnosis not present

## 2022-10-01 DIAGNOSIS — I4891 Unspecified atrial fibrillation: Secondary | ICD-10-CM | POA: Insufficient documentation

## 2022-10-01 DIAGNOSIS — M1990 Unspecified osteoarthritis, unspecified site: Secondary | ICD-10-CM | POA: Insufficient documentation

## 2022-10-01 DIAGNOSIS — I119 Hypertensive heart disease without heart failure: Secondary | ICD-10-CM | POA: Insufficient documentation

## 2022-10-01 DIAGNOSIS — Z85828 Personal history of other malignant neoplasm of skin: Secondary | ICD-10-CM | POA: Insufficient documentation

## 2022-10-01 DIAGNOSIS — K5730 Diverticulosis of large intestine without perforation or abscess without bleeding: Secondary | ICD-10-CM | POA: Insufficient documentation

## 2022-10-01 DIAGNOSIS — Z1211 Encounter for screening for malignant neoplasm of colon: Secondary | ICD-10-CM | POA: Insufficient documentation

## 2022-10-01 DIAGNOSIS — F32A Depression, unspecified: Secondary | ICD-10-CM | POA: Insufficient documentation

## 2022-10-01 DIAGNOSIS — Z7985 Long-term (current) use of injectable non-insulin antidiabetic drugs: Secondary | ICD-10-CM | POA: Diagnosis not present

## 2022-10-01 DIAGNOSIS — Z950 Presence of cardiac pacemaker: Secondary | ICD-10-CM | POA: Insufficient documentation

## 2022-10-01 DIAGNOSIS — I252 Old myocardial infarction: Secondary | ICD-10-CM | POA: Diagnosis not present

## 2022-10-01 DIAGNOSIS — I2510 Atherosclerotic heart disease of native coronary artery without angina pectoris: Secondary | ICD-10-CM | POA: Diagnosis not present

## 2022-10-01 DIAGNOSIS — G4730 Sleep apnea, unspecified: Secondary | ICD-10-CM | POA: Insufficient documentation

## 2022-10-01 DIAGNOSIS — K648 Other hemorrhoids: Secondary | ICD-10-CM | POA: Insufficient documentation

## 2022-10-01 DIAGNOSIS — E119 Type 2 diabetes mellitus without complications: Secondary | ICD-10-CM | POA: Diagnosis not present

## 2022-10-01 DIAGNOSIS — Z7984 Long term (current) use of oral hypoglycemic drugs: Secondary | ICD-10-CM | POA: Insufficient documentation

## 2022-10-01 DIAGNOSIS — E785 Hyperlipidemia, unspecified: Secondary | ICD-10-CM | POA: Diagnosis not present

## 2022-10-01 DIAGNOSIS — I480 Paroxysmal atrial fibrillation: Secondary | ICD-10-CM | POA: Diagnosis not present

## 2022-10-01 DIAGNOSIS — Z955 Presence of coronary angioplasty implant and graft: Secondary | ICD-10-CM | POA: Diagnosis not present

## 2022-10-01 DIAGNOSIS — Z79899 Other long term (current) drug therapy: Secondary | ICD-10-CM | POA: Insufficient documentation

## 2022-10-01 HISTORY — PX: COLONOSCOPY WITH PROPOFOL: SHX5780

## 2022-10-01 HISTORY — DX: Encounter for screening for malignant neoplasm of colon: Z12.11

## 2022-10-01 HISTORY — DX: Presence of cardiac pacemaker: Z95.0

## 2022-10-01 LAB — BASIC METABOLIC PANEL
Anion gap: 12 (ref 5–15)
BUN: 11 mg/dL (ref 8–23)
CO2: 26 mmol/L (ref 22–32)
Calcium: 9.1 mg/dL (ref 8.9–10.3)
Chloride: 94 mmol/L — ABNORMAL LOW (ref 98–111)
Creatinine, Ser: 0.8 mg/dL (ref 0.61–1.24)
GFR, Estimated: >60 mL/min (ref >60)
Glucose, Bld: 93 mg/dL (ref 70–99)
Potassium: 4.2 mmol/L (ref 3.5–5.1)
Sodium: 132 mmol/L — ABNORMAL LOW (ref 135–145)

## 2022-10-01 LAB — GLUCOSE, CAPILLARY: Glucose-Capillary: 93 mg/dL (ref 70–99)

## 2022-10-01 LAB — HM COLONOSCOPY

## 2022-10-01 SURGERY — COLONOSCOPY WITH PROPOFOL
Anesthesia: General

## 2022-10-01 MED ORDER — PROPOFOL 10 MG/ML IV BOLUS
INTRAVENOUS | Status: DC | PRN
  Administered 2022-10-01: 100 mg via INTRAVENOUS

## 2022-10-01 MED ORDER — LACTATED RINGERS IV SOLN: INTRAVENOUS | Status: DC | PRN

## 2022-10-01 MED ORDER — LIDOCAINE HCL (CARDIAC) PF 100 MG/5ML IV SOSY
PREFILLED_SYRINGE | INTRAVENOUS | Status: DC | PRN
  Administered 2022-10-01: 50 mg via INTRAVENOUS

## 2022-10-01 MED ORDER — LACTATED RINGERS IV SOLN: INTRAVENOUS | Status: DC

## 2022-10-01 MED ORDER — PHENYLEPHRINE HCL (PRESSORS) 10 MG/ML IV SOLN
INTRAVENOUS | Status: DC | PRN
  Administered 2022-10-01 (×3): 80 ug via INTRAVENOUS
  Administered 2022-10-01: 160 ug via INTRAVENOUS

## 2022-10-01 MED ORDER — PROPOFOL 500 MG/50ML IV EMUL
INTRAVENOUS | Status: DC | PRN
  Administered 2022-10-01: 150 ug/kg/min via INTRAVENOUS

## 2022-10-01 MED ORDER — PHENYLEPHRINE 80 MCG/ML (10ML) SYRINGE FOR IV PUSH (FOR BLOOD PRESSURE SUPPORT)
PREFILLED_SYRINGE | INTRAVENOUS | Status: AC
  Filled 2022-10-01: qty 10

## 2022-10-01 NOTE — Transfer of Care (Signed)
Immediate Anesthesia Transfer of Care Note  Patient: Lee [name]  Procedure(s) Performed: COLONOSCOPY WITH PROPOFOL  Patient Location: Short Stay  Anesthesia Type:General  Level of Consciousness: drowsy  Airway & Oxygen Therapy: Patient Spontanous Breathing  Post-op Assessment: Report given to RN and Post -op Vital signs reviewed and stable  Post vital signs: Reviewed and stable  Last Vitals:  Vitals Value Taken Time  BP 126/70 10/01/22 0824  Temp    Pulse 71 10/01/22 0824  Resp 18 10/01/22 0824  SpO2 97 % 10/01/22 0824    Last Pain:  Vitals:   10/01/22 0824  TempSrc:   PainSc: 0-No pain      Patients Stated Pain Goal: 7 (10/01/22 0759)  Complications: No notable events documented.

## 2022-10-01 NOTE — Op Note (Signed)
[facility] Patient Name: Lee [name] Procedure Date: 10/01/2022 7:51 AM MRN: [id number] Date of Birth: [birth year] Attending MD: Katrinka Blazing , , [id number] CSN: [id number] Age: 72 Admit Type: Outpatient Procedure:                Colonoscopy Indications:              Screening for colorectal malignant neoplasm Providers:                Katrinka Blazing, Nena Polio, RN, Lennice Sites                            Technician, Technician Referring MD:              Medicines:                Monitored Anesthesia Care Complications:            No immediate complications. Estimated Blood Loss:     Estimated blood loss: none. Procedure:                Pre-Anesthesia Assessment:                           - Prior to the procedure, a History and Physical                            was performed, and patient medications, allergies                            and sensitivities were reviewed. The patient's                            tolerance of previous anesthesia was reviewed.                           - The risks and benefits of the procedure and the                            sedation options and risks were discussed with the                            patient. All questions were answered and informed                            consent was obtained.                           - ASA Grade Assessment: II - A patient with mild                            systemic disease.                           After obtaining informed consent, the colonoscope                            was passed under direct vision. Throughout the  procedure, the patient's blood pressure, pulse, and                            oxygen saturations were monitored continuously. The                            [id number]-HQ190L ([id number]) scope was introduced through                            the anus and advanced to the the cecum, identified                            by appendiceal orifice and ileocecal  valve. The                            colonoscopy was performed without difficulty. The                            patient tolerated the procedure well. The quality                            of the bowel preparation was adequate. Scope In: 8:02:52 AM Scope Out: 8:20:35 AM Scope Withdrawal Time: 0 hours 12 minutes 0 seconds  Total Procedure Duration: 0 hours 17 minutes 43 seconds  Findings:      The perianal and digital rectal examinations were normal.      Scattered large-mouthed and small-mouthed diverticula were found in the       sigmoid colon and descending colon.      Non-bleeding internal hemorrhoids were found during retroflexion. The       hemorrhoids were small. Impression:               - Diverticulosis in the sigmoid colon and in the                            descending colon.                           - Non-bleeding internal hemorrhoids.                           - No specimens collected. Moderate Sedation:      Per Anesthesia Care Recommendation:           - Discharge patient to home (ambulatory).                           - Resume previous diet.                           - Continue present medications.                           - Repeat colonoscopy in 10 years for screening                            purposes. Procedure Code(s):        ---  Professional ---                           U9811, Colorectal cancer screening; colonoscopy on                            individual not meeting criteria for high risk Diagnosis Code(s):        --- Professional ---                           Z12.11, Encounter for screening for malignant                            neoplasm of colon                           K64.8, Other hemorrhoids                           K57.30, Diverticulosis of large intestine without                            perforation or abscess without bleeding CPT copyright 2022 American Medical Association. All rights reserved. The codes documented in this report are  preliminary and upon coder review may  be revised to meet current compliance requirements. Katrinka Blazing, MD Katrinka Blazing,  10/01/2022 8:26:48 AM This report has been signed electronically. Number of Addenda: 0

## 2022-10-01 NOTE — Anesthesia Procedure Notes (Signed)
Date/Time: 10/01/2022 8:00 AM  Performed by: Julian Reil, CRNAPre-anesthesia Checklist: Patient identified, Emergency Drugs available, Suction available and Patient being monitored Oxygen Delivery Method: Simple face mask Placement Confirmation: positive ETCO2

## 2022-10-01 NOTE — Anesthesia Preprocedure Evaluation (Signed)
Anesthesia Evaluation  Patient identified by MRN, date of birth, ID band Patient awake    Reviewed: Allergy & Precautions, H&P , NPO status , Patient's Chart, lab work & pertinent test results, reviewed documented beta blocker date and time   Airway Mallampati: II  TM Distance: >3 FB Neck ROM: full    Dental no notable dental hx.    Pulmonary neg pulmonary ROS, sleep apnea , COPD, former smoker   Pulmonary exam normal breath sounds clear to auscultation       Cardiovascular Exercise Tolerance: Good hypertension, + CAD, + Past MI and + Cardiac Stents  + dysrhythmias + pacemaker + Valvular Problems/Murmurs  Rhythm:regular Rate:Normal     Neuro/Psych  Headaches PSYCHIATRIC DISORDERS  Depression    negative neurological ROS  negative psych ROS   GI/Hepatic negative GI ROS, Neg liver ROS,GERD  ,,  Endo/Other  negative endocrine ROSdiabetes    Renal/GU negative Renal ROS  negative genitourinary   Musculoskeletal   Abdominal   Peds  Hematology negative hematology ROS (+) Blood dyscrasia, anemia   Anesthesia Other Findings   Reproductive/Obstetrics negative OB ROS                             Anesthesia Physical Anesthesia Plan  ASA: 3  Anesthesia Plan: General   Post-op Pain Management:    Induction:   PONV Risk Score and Plan: Propofol infusion  Airway Management Planned:   Additional Equipment:   Intra-op Plan:   Post-operative Plan:   Informed Consent: I have reviewed the patients History and Physical, chart, labs and discussed the procedure including the risks, benefits and alternatives for the proposed anesthesia with the patient or authorized representative who has indicated his/her understanding and acceptance.     Dental Advisory Given  Plan Discussed with: CRNA  Anesthesia Plan Comments:        Anesthesia Quick Evaluation

## 2022-10-01 NOTE — Discharge Instructions (Addendum)
You are being discharged to home.  Resume your previous diet.  Continue your present medications.  Your physician has recommended a repeat colonoscopy in 10 years for screening purposes.  

## 2022-10-01 NOTE — H&P (Signed)
Lee [name] is an 72 y.o. male.   Chief Complaint: Screening colonoscopy HPI: 23 male with past medical history of GERD, coronary artery disease, skin cancer, diabetes, depression, COPD, hyperlipidemia, hypertension, A-fib, coming for screening colonoscopy.  Last colonoscopy was performed in 2014, no polyps.  The patient denies having any complaints such as melena, hematochezia, abdominal pain or distention, change in her bowel movement consistency or frequency, no changes in weight recently.  No family history of colorectal cancer.   Past Medical History:  Diagnosis Date   Allergic rhinitis    chronic sinusitis   Arthritis    osetoarthritis   CAD (coronary artery disease)    a. 2009 PCI/DES to mLAD; b. 05/2010 s/p DES RCA and LAD.; c. 05/2013 Cath: LAD mild ISR, LCX nl, RCA 40p, patent stents.   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   Cataract    has had lasik surg   COPD (chronic obstructive pulmonary disease) (HCC)    Depression    Diabetes mellitus type 2, controlled, without complications (HCC)    Diabetes mellitus without complication (HCC)    Phreesia 08/22/2020   Diverticulosis    DJD (degenerative joint disease)    Esophagitis 1991   grade 1   GERD (gastroesophageal reflux disease)    Hiatal hernia   Hearing loss    wears bilateral hearing aids   Heart murmur    Dr Tanya Nones dx per patient   Hemorrhoids    Hyperlipidemia    Hypertension    Hypertensive heart disease    Iron deficiency anemia    Migraines    Myocardial infarction [facility])    Paroxysmal atrial fibrillation (HCC)    a. s/p afib ablation 10/22/08 (J. Allred).   Presence of permanent cardiac pacemaker    PVC's (premature ventricular contractions)    Sleep apnea    Uses nightly.  Questionalble: RDI during the total sleep time 6h 28 mins was 3.55/hr during REM sleep was at 5.71/hr. Supine AHI was 5.93/hr.   Syncope 08/2018    Past Surgical History:  Procedure Laterality Date    ABDOMINAL HYSTERECTOMY     CARDIAC CATHETERIZATION  05/31/2010   The proximal LAD was then predilated with 2.0 x 12 trek. this was then stented with a 2.5 x 16 promus Element drug-eluting stent at 14 atmosphere and prostdilated with 2.75 x 12 Taylor trek at 16 atmospheres(2.8 mm) resulting the reduction of 80% proximal LAD stenosis to 0% residual with excellent flow.   CARDIAC CATHETERIZATION  06/05/2010   Successful percutaneous coronary intervention to the right coronary artery with percutaneous transluminal coronary angioplasty/stenting and insertion 3.0 x 16 mm Promus DES post dilated to 3.35 mm with stenosis being reduced to 0%   CARDIAC CATHETERIZATION  02/29/2008   Post dilatation was performed using a 2.75 x 9 [place] sprinter, 10 atmospheres for 40 seconds and then 9 atmosphere for 35 sec. This resulted in the 80% area of narrowing pre-intervention, now appearing to normal. There was no edvidence of the dissection or thrombus and there was TIMI III flow pre and post.   CARDIAC CATHETERIZATION  02/28/2006   CORONARY ANGIOPLASTY WITH STENT PLACEMENT     3 stents/ 4 caths   DISTAL BICEPS TENDON REPAIR Left 02/12/2022   Procedure: LEFT DISTAL BICEPS TENDON REPAIR;  Surgeon: Tarry Kos, MD;  Location: [facility] OR;  Service: Orthopedics;  Laterality: Left;   DISTAL BICEPS TENDON REPAIR Left 03/03/2022   Procedure: LEFT DISTAL  BICEPS TENDON REPAIR;  Surgeon: Tarry Kos, MD;  Location: [facility] OR;  Service: Orthopedics;  Laterality: Left;   EYE SURGERY     FINGER ARTHROPLASTY Right 09/02/2021   Procedure: RIGHT THUMB CARPOMETACARPAL ARTHROPLASTY;  Surgeon: Tarry Kos, MD;  Location: [facility];  Service: Orthopedics;  Laterality: Right;  block in preop   KNEE ARTHROSCOPY     right   LASIK     LEFT HEART CATHETERIZATION WITH CORONARY ANGIOGRAM N/A 06/29/2013   Procedure: LEFT HEART CATHETERIZATION WITH CORONARY ANGIOGRAM;  Surgeon: Marykay Lex, MD;  Location[facility] CATH LAB;  Service:  Cardiovascular;  Laterality: N/A;   NASAL SINUS SURGERY  06/01/1999   NECK SURGERY     Cervical fusion   PACEMAKER IMPLANT N/A 05/01/2019   Procedure: PACEMAKER IMPLANT;  Surgeon: Hillis Range, MD;  Location: [facility] INVASIVE CV LAB;  Service: Cardiovascular;  Laterality: N/A;   RADIOFREQUENCY ABLATION  09/28/2008   atrial fibrillation   ROTATOR CUFF REPAIR     left   SHOULDER OPEN ROTATOR CUFF REPAIR     right   SPINE SURGERY     WRIST ARTHROCENTESIS     left    Family History  Problem Relation Age of Onset   Colon cancer Mother        mets from uterine   Uterine cancer Mother    Heart disease Father        and sister   Diabetes Father    Hearing loss Father    Stroke Father    Heart disease Sister    Lung cancer Sister    Stroke Brother    Social History:  reports that he quit smoking about 34 years ago. His smoking use included cigarettes and cigars. He has a 50.00 pack-year smoking history. He has been exposed to tobacco smoke. He has never used smokeless tobacco. He reports that he does not currently use alcohol. He reports that he does not use drugs.  Allergies:  Allergies  Allergen Reactions   Ibuprofen Swelling    Whole body swelled   Other Shortness Of Breath    BETA BLOCKERS   Topamax [Topiramate] Other (See Comments)    Pt states it made his tongue feel "scalded" and he couldn't eat    Toprol Xl [Metoprolol Succinate] Other (See Comments)    Personality change   Metoprolol-Hctz Er Other (See Comments)    Indigestion,mouth raw    Medications Prior to Admission  Medication Sig Dispense Refill   acetaminophen (TYLENOL) 325 MG tablet Take 1-2 tablets (325-650 mg total) by mouth every 4 (four) hours as needed for mild pain.     amLODipine (NORVASC) 10 MG tablet TAKE 1 TABLET(10 MG) BY MOUTH DAILY 90 tablet 1   aspirin EC 81 MG tablet Take 81 mg by mouth at bedtime.     cholecalciferol (VITAMIN D) 1000 UNITS tablet Take 1,000 Units by mouth at bedtime.       citalopram (CELEXA) 40 MG tablet TAKE 1 TABLET(40 MG) BY MOUTH DAILY 90 tablet 1   Coenzyme Q10 (CO Q 10) 100 MG CAPS Take 100 mg by mouth daily with breakfast.      fexofenadine (ALLEGRA) 180 MG tablet Take 180 mg by mouth daily with breakfast.      fluticasone (FLONASE) 50 MCG/ACT nasal spray INSTILL 2 SPRAYS IN THE  NOSE AT BEDTIME 48 g 3   furosemide (LASIX) 40 MG tablet TAKE 1 TABLET BY MOUTH  DAILY 90 tablet 3  Galcanezumab-gnlm (EMGALITY) 120 MG/ML SOAJ Inject 120 mg into the skin every 30 (thirty) days. 1.12 mL 11   hydrOXYzine (VISTARIL) 25 MG capsule Take 1 capsule (25 mg total) by mouth at bedtime as needed. 30 capsule 1   JARDIANCE 25 MG TABS tablet TAKE 1 TABLET BY MOUTH DAILY BEFORE BREAKFAST 90 tablet 1   Multiple Vitamin (MULTIVITAMIN WITH MINERALS) TABS tablet Take 1 tablet by mouth daily with breakfast.     nitroGLYCERIN (NITROSTAT) 0.4 MG SL tablet PLACE ONE TABLET UNDER THE TONGUE EVERY 5 MINUTES AS NEEDED FOR CHEST PAIN 25 tablet 3   oxyCODONE-acetaminophen (PERCOCET) 10-325 MG tablet Take 1 tablet by mouth every 8 (eight) hours as needed for pain. 21 tablet 0   pantoprazole (PROTONIX) 40 MG tablet TAKE 1 TABLET BY MOUTH DAILY 90 tablet 3   Polyvinyl Alcohol-Povidone (REFRESH OP) Place 1 drop into both eyes at bedtime as needed (dry eyes).     potassium chloride (MICRO-K) 10 MEQ CR capsule TAKE 1 CAPSULE BY MOUTH DAILY 90 capsule 3   Rimegepant Sulfate 75 MG TBDP Take 75 mg by mouth daily as needed (Migraine). 16 tablet 11   rosuvastatin (CRESTOR) 40 MG tablet TAKE 1 TABLET BY MOUTH DAILY 90 tablet 3   telmisartan (MICARDIS) 40 MG tablet TAKE 1 TABLET BY MOUTH  DAILY 90 tablet 3   Accu-Chek FastClix Lancets MISC Use as directed to check blood sugars twice per day. 100 each 5   ACCU-CHEK GUIDE test strip USE TO CHECK BLOOD SUGAR LEVELS TWICE DAILY. 100 strip 1   Blood Glucose Monitoring Suppl (ACCU-CHEK AVIVA PLUS) w/Device KIT Check BS bid E11.9 1 kit 1   Lancets Misc.  (ACCU-CHEK FASTCLIX LANCET) KIT USE AS DIRECTED TO CHECK BLOOD SUGAR TWICE DAILY 1 kit 1   tirzepatide (MOUNJARO) 12.5 MG/0.5ML Pen Inject 12.5 mg into the skin once a week. 6 mL 2    Results for orders placed or performed during the hospital encounter of 10/01/22 (from the past 48 hour(s))  Glucose, capillary     Status: None   Collection Time: 10/01/22  7:02 AM  Result Value Ref Range   Glucose-Capillary 93 70 - 99 mg/dL    Comment: Glucose reference range applies only to samples taken after fasting for at least 8 hours.   No results found.  Review of Systems  All other systems reviewed and are negative.   Blood pressure 134/83, pulse 80, temperature 97.6 F (36.4 C), temperature source Oral, resp. rate 19, height 5\' 8"  (1.727 m), weight 90.3 kg. Physical Exam  GENERAL: The patient is AO x3, in no acute distress. HEENT: Head is normocephalic and atraumatic. EOMI are intact. Mouth is well hydrated and without lesions. NECK: Supple. No masses LUNGS: Clear to auscultation. No presence of rhonchi/wheezing/rales. Adequate chest expansion HEART: RRR, normal s1 and s2. ABDOMEN: Soft, nontender, no guarding, no peritoneal signs, and nondistended. BS +. No masses. EXTREMITIES: Without any cyanosis, clubbing, rash, lesions or edema. NEUROLOGIC: AOx3, no focal motor deficit. SKIN: no jaundice, no rashes  Assessment/Plan 44 male with past medical history of GERD, coronary artery disease, skin cancer, diabetes, depression, COPD, hyperlipidemia, hypertension, A-fib, coming for screening colonoscopy.  The patient is at average risk for colorectal cancer.  We will proceed with colonoscopy today.   Dolores Frame, MD 10/01/2022, 7:34 AM

## 2022-10-03 NOTE — Anesthesia Postprocedure Evaluation (Signed)
Anesthesia Post Note  Patient: Lee [name]  Procedure(s) Performed: COLONOSCOPY WITH PROPOFOL  Patient location during evaluation: Phase II Anesthesia Type: General Level of consciousness: awake Pain management: pain level controlled Vital Signs Assessment: post-procedure vital signs reviewed and stable Respiratory status: spontaneous breathing and respiratory function stable Cardiovascular status: blood pressure returned to baseline and stable Postop Assessment: no headache and no apparent nausea or vomiting Anesthetic complications: no Comments: Late entry   No notable events documented.   Last Vitals:  Vitals:   10/01/22 0724 10/01/22 0824  BP: 134/83 126/70  Pulse: 80 71  Resp: 19 18  Temp: 36.4 C   SpO2:  97%    Last Pain:  Vitals:   10/01/22 0824  TempSrc:   PainSc: 0-No pain                 Windell Norfolk

## 2022-10-04 ENCOUNTER — Encounter (INDEPENDENT_AMBULATORY_CARE_PROVIDER_SITE_OTHER): Payer: Self-pay | Admitting: *Deleted

## 2022-10-07 ENCOUNTER — Other Ambulatory Visit (HOSPITAL_COMMUNITY): Payer: Self-pay

## 2022-10-08 ENCOUNTER — Encounter (HOSPITAL_COMMUNITY): Payer: Self-pay | Admitting: Gastroenterology

## 2022-10-18 ENCOUNTER — Other Ambulatory Visit: Payer: Self-pay | Admitting: Cardiovascular Disease

## 2022-10-19 ENCOUNTER — Ambulatory Visit: Payer: Medicare Other | Admitting: Podiatry

## 2022-10-20 ENCOUNTER — Other Ambulatory Visit: Payer: Self-pay | Admitting: Family Medicine

## 2022-10-20 NOTE — Telephone Encounter (Signed)
Requested Prescriptions  Pending Prescriptions Disp Refills   JARDIANCE 25 MG TABS tablet [Pharmacy Med Name: JARDIANCE 25MG  TABLETS] 90 tablet 0    Sig: TAKE 1 TABLET BY MOUTH DAILY BEFORE BREAKFAST     Endocrinology:  Diabetes - SGLT2 Inhibitors Failed - 10/20/2022  7:05 AM      Failed - Valid encounter within last 6 months    Recent Outpatient Visits           1 year ago Coronary artery disease involving native coronary artery of native heart without angina pectoris   [facility] Donita Brooks, MD   1 year ago Type 2 diabetes mellitus with complication, without long-term current use of insulin (HCC)   [facility] Pickard, Priscille Heidelberg, MD   1 year ago Wrist pain, chronic, right   [facility] Pickard, Priscille Heidelberg, MD   2 years ago General medical exam   [facility] Donita Brooks, MD   2 years ago Type 2 diabetes mellitus with complication, without long-term current use of insulin (HCC)   [facility] Pickard, Priscille Heidelberg, MD       Future Appointments             In 3 months Lennette Bihari, MD [facility]   In 8 months Pickard, Priscille Heidelberg, MD [facility]            Passed - Cr in normal range and within 360 days    Creat  Date Value Ref Range Status  06/22/2022 0.75 0.70 - 1.28 mg/dL Final   Creatinine, Ser  Date Value Ref Range Status  10/01/2022 0.80 0.61 - 1.24 mg/dL Final   Creatinine, Urine  Date Value Ref Range Status  06/25/2022 17 (L) 20 - 320 mg/dL Final         Passed - HBA1C is between 0 and 7.9 and within 180 days    Hgb A1c MFr Bld  Date Value Ref Range Status  06/22/2022 6.2 (H) <5.7 % of total Hgb Final    Comment:    For someone without known diabetes, a hemoglobin  A1c value between 5.7% and 6.4% is consistent with prediabetes and should be confirmed with a  follow-up test. . For someone with known  diabetes, a value <7% indicates that their diabetes is well controlled. A1c targets should be individualized based on duration of diabetes, age, comorbid conditions, and other considerations. . This assay result is consistent with an increased risk of diabetes. . Currently, no consensus exists regarding use of hemoglobin A1c for diagnosis of diabetes for children. .          Passed - eGFR in normal range and within 360 days    GFR, Est African American  Date Value Ref Range Status  08/18/2020 102 > OR = 60 mL/min/1.10m2 Final   GFR, Est Non African American  Date Value Ref Range Status  08/18/2020 88 > OR = 60 mL/min/1.31m2 Final   GFR, Estimated  Date Value Ref Range Status  10/01/2022 >60 >60 mL/min Final    Comment:    (NOTE) Calculated using the CKD-EPI Creatinine Equation (2021)    eGFR  Date Value Ref Range Status  06/22/2022 96 > OR = 60 mL/min/1.66m2 Final

## 2022-10-25 ENCOUNTER — Other Ambulatory Visit: Payer: Self-pay | Admitting: Family Medicine

## 2022-10-26 MED ORDER — OXYCODONE-ACETAMINOPHEN 10-325 MG PO TABS: 1.0000 | ORAL_TABLET | Freq: Three times a day (TID) | ORAL | 0 refills | Status: DC | PRN

## 2022-11-08 ENCOUNTER — Ambulatory Visit (INDEPENDENT_AMBULATORY_CARE_PROVIDER_SITE_OTHER): Payer: Medicare Other | Admitting: Adult Health

## 2022-11-08 ENCOUNTER — Encounter (HOSPITAL_COMMUNITY): Payer: Self-pay

## 2022-11-08 ENCOUNTER — Encounter: Payer: Self-pay | Admitting: Adult Health

## 2022-11-08 ENCOUNTER — Other Ambulatory Visit (HOSPITAL_COMMUNITY): Payer: Self-pay

## 2022-11-08 VITALS — BP 120/85 | HR 83 | Ht 68.0 in | Wt 203.6 lb

## 2022-11-08 DIAGNOSIS — G43701 Chronic migraine without aura, not intractable, with status migrainosus: Secondary | ICD-10-CM

## 2022-11-08 MED ORDER — QULIPTA 30 MG PO TABS
30.0000 mg | ORAL_TABLET | Freq: Every day | ORAL | 11 refills | Status: DC
  Filled 2022-11-08 – 2022-11-25 (×3): qty 30, 30d supply, fill #0
  Filled 2022-12-14 – 2022-12-20 (×2): qty 30, 30d supply, fill #1
  Filled 2023-01-22: qty 30, 30d supply, fill #2
  Filled 2023-02-21: qty 30, 30d supply, fill #3
  Filled 2023-03-20: qty 30, 30d supply, fill #4
  Filled 2023-04-22: qty 30, 30d supply, fill #5
  Filled 2023-05-20: qty 30, 30d supply, fill #6
  Filled 2023-06-18: qty 30, 30d supply, fill #7

## 2022-11-08 NOTE — Progress Notes (Signed)
PATIENT: Lee [name] DOB: 1951/02/05  REASON FOR VISIT: follow up HISTORY FROM: patient PRIMARY NEUROLOGIST: Dr. Lucia Gaskins   Chief Complaint  Patient presents with   Follow-up    Pt in 19 with wife Pt states 6 migraines in last month  Pt states migraine pain starts in temporals and travels to back of head Pt states no neck pain Pt states migraine yesterday      HISTORY OF PRESENT ILLNESS: Today 11/08/22  Lee [name] is a 72 y.o. male who has been followed in this office for MIgraine headaches. Returns today for follow-up. Reports that Emgality has reduced the migraines.  He reports that he has approximately 6 migraines a month.  But can have 4 dull headaches a week.  Some weeks he may not have any dull headaches.  He does tend to take over-the-counter medication Tylenol ibuprofen daily or at least several times a week.  He has Nurtec but reports that it does not resolve the headache fairly quickly.  He has tried several medications in the past.  HISTORY Addendum: Patient is doing excellent on Emgality.  He is only having 4 migraines a month and less than 10 total headache days a month.  We will refill his Nurtec for acute management.4 migraine days a month. < 10 total headache days a month. Failed mitrex, Nurtec, maxalt   HPI:  Lee [name] is a 72 y.o. male here as requested by Donita Brooks, MD for migraines.  Past medical history second-degree Mobitz 2 AV block, heart block complete, symptomatic bradycardia, vasovagal syncope, depression, hypertensive heart disease, hypertension, paroxysmal A-fib, hyperlipidemia, morbid obesity, obstructive sleep apnea, CAD, COPD, depression, DM2, DJD, heart disease, MI, pacemaker, Afib s/p ablation, crdiac caths s/p stents, cervical fusion.  Reviewed notes from [facility], he has tried and failed multiple medications, being treated with Aimovig and Nurtec, using CPAP, they recommended weight loss and he has lost 40 pounds.    Here with wife who provides much information. Migraines for 10+ years, can start unilaterally but often start in the back of the head, pulsating/pounding/throbbing, photophobia/phonophobia, a quiet dark room helps, weather is a trigger, nausea, no vomiting, can last up to a week. He has tried a multitude of medications, Aimovig works the best, no significant side effects, emgality worked better, only 4 migraine days a month and but 15 total headache days a month. Using cpap religiously. Baseline prior to CGRPS was having > 12 migraine days a month and > 20 total headache days a month. Nurtec helps with the headaches as well. He is due to take aimovig September 1st, will change to emgality. They can last 8 hours to 3 days-4 days. No aura or prodrome. No other focal neurologic deficits, associated symptoms, inciting events or modifiable factors.     Reviewed notes, labs and imaging from outside physicians, which showed: Reviewed notes and all prior medications as below:   Current and past medications: ANALGESICS: Anacin, Goody's, codeine, Combunox, Darvon, Excedrin, Kadian, Lidoderm patch, Lorcet, Percocet, Tylenol, Ultram, Vicodin ANTI-MIGRAINE: Dhe nasal spray, Imitrex, Nurtec, maxalt HEART/BP: Coreg, propranolol, Toprol, Lotensin, Norvasc, atenolol, Cardizem DECONGESTANT/ANTIHISTAMINE: Allegra, Benadryl, Clarinex, Dramamine Entex T, Flonase, Nasonex, Sudafed, Zyrtec ANTI-NAUSEANT: Dramamine NSAIDS: Advil, Aleve MUSCLE RELAXANTS:  ANTI-CONVULSANTS: Topamax, Depakote, Keppra STEROIDS: Hydrocortisone, prednisone SLEEPING PILLS/TRANQUILIZERS: Ambien, Halcion, Tylenol PM, Valium, Xanax ANTI-DEPRESSANTS: Celexa HERBAL: CoQ10 FIBROMYALGIA:  HORMONAL: OTHER: Emgality, Aimovig(contrindicated his BP is elevated and worse on aimovig and also contraindicated due to constipation) PROCEDURES FOR HEADACHES: Botox  CT head: COMPARISON:  09/20/2018   FINDINGS: 05/03/2020 Brain: No acute infarct or  hemorrhage. Lateral ventricles and midline structures are unremarkable. No acute extra-axial fluid collections. No mass effect.   Vascular: No hyperdense vessel or unexpected calcification.   Skull: Laceration left parietal convexity of the scalp. No underlying fracture. Remainder of the calvarium is unremarkable.   Sinuses/Orbits: No acute finding.   Other: None.   IMPRESSION: 1. Scalp laceration at the left parietal convexity. 2. No acute intracranial process.     Electronically Signed   By: Sharlet Salina M.D.   On: 05/03/2020 20:35   09/20/2018: MRI brain: FINDINGS: BRAIN: There is no acute infarct, acute hemorrhage or extra-axial collection. The midline structures are normal. No midline shift or other mass effect. The white matter signal is normal for the patient's age. The cerebral and cerebellar volume are age-appropriate. No hydrocephalus. Susceptibility-sensitive sequences show no chronic microhemorrhage or superficial siderosis. No abnormal contrast enhancement.   VASCULAR: The major intracranial arterial and venous sinus flow voids are normal.   SKULL AND UPPER CERVICAL SPINE: Calvarial bone marrow signal is normal. There is no skull base mass. Visualized upper cervical spine and soft tissues are normal.   SINUSES/ORBITS: No fluid levels or advanced mucosal thickening. No mastoid or middle ear effusion. The orbits are normal.   IMPRESSION: Normal aging brain.       Latest Ref Rng & Units 12/07/2021    2:31 PM 08/25/2021    2:23 PM 06/26/2021    8:04 AM  CMP  Glucose 65 - 99 mg/dL 161  89  096   BUN 7 - 25 mg/dL 13  15  16    Creatinine 0.70 - 1.28 mg/dL 0.45  4.09  8.11   Sodium 135 - 146 mmol/L 136  135  136   Potassium 3.5 - 5.3 mmol/L 4.5  4.6  4.6   Chloride 98 - 110 mmol/L 100  100  99   CO2 20 - 32 mmol/L 27  28  29    Calcium 8.6 - 10.3 mg/dL 9.3  9.5  9.8   Total Protein 6.1 - 8.1 g/dL 6.6    6.9   Total Bilirubin 0.2 - 1.2 mg/dL 0.4    0.6    AST 10 - 35 U/L 14    23   ALT 9 - 46 U/L 13    20     REVIEW OF SYSTEMS: Out of a complete 14 system review of symptoms, the patient complains only of the following symptoms, and all other reviewed systems are negative.  ALLERGIES: Allergies  Allergen Reactions   Ibuprofen Swelling    Whole body swelled   Other Shortness Of Breath    BETA BLOCKERS   Topamax [Topiramate] Other (See Comments)    Pt states it made his tongue feel "scalded" and he couldn't eat    Toprol Xl [Metoprolol Succinate] Other (See Comments)    Personality change   Metoprolol-Hctz Er Other (See Comments)    Indigestion,mouth raw    HOME MEDICATIONS: Outpatient Medications Prior to Visit  Medication Sig Dispense Refill   Accu-Chek FastClix Lancets MISC Use as directed to check blood sugars twice per day. 100 each 5   ACCU-CHEK GUIDE test strip USE TO CHECK BLOOD SUGAR LEVELS TWICE DAILY. 100 strip 1   acetaminophen (TYLENOL) 325 MG tablet Take 1-2 tablets (325-650 mg total) by mouth every 4 (four) hours as needed for mild pain.     amLODipine (NORVASC) 10 MG tablet  TAKE 1 TABLET(10 MG) BY MOUTH DAILY 90 tablet 1   aspirin EC 81 MG tablet Take 81 mg by mouth at bedtime.     Blood Glucose Monitoring Suppl (ACCU-CHEK AVIVA PLUS) w/Device KIT Check BS bid E11.9 1 kit 1   cholecalciferol (VITAMIN D) 1000 UNITS tablet Take 1,000 Units by mouth at bedtime.      citalopram (CELEXA) 40 MG tablet TAKE 1 TABLET(40 MG) BY MOUTH DAILY 90 tablet 1   Coenzyme Q10 (CO Q 10) 100 MG CAPS Take 100 mg by mouth daily with breakfast.      empagliflozin (JARDIANCE) 25 MG TABS tablet TAKE 1 TABLET BY MOUTH DAILY BEFORE BREAKFAST 90 tablet 0   fexofenadine (ALLEGRA) 180 MG tablet Take 180 mg by mouth daily with breakfast.      fluticasone (FLONASE) 50 MCG/ACT nasal spray INSTILL 2 SPRAYS IN THE  NOSE AT BEDTIME 48 g 3   furosemide (LASIX) 40 MG tablet TAKE 1 TABLET BY MOUTH DAILY 90 tablet 3   Galcanezumab-gnlm (EMGALITY) 120 MG/ML  SOAJ Inject 120 mg into the skin every 30 (thirty) days. 1.12 mL 11   hydrOXYzine (VISTARIL) 25 MG capsule Take 1 capsule (25 mg total) by mouth at bedtime as needed. 30 capsule 1   Lancets Misc. (ACCU-CHEK FASTCLIX LANCET) KIT USE AS DIRECTED TO CHECK BLOOD SUGAR TWICE DAILY 1 kit 1   Multiple Vitamin (MULTIVITAMIN WITH MINERALS) TABS tablet Take 1 tablet by mouth daily with breakfast.     nitroGLYCERIN (NITROSTAT) 0.4 MG SL tablet PLACE ONE TABLET UNDER THE TONGUE EVERY 5 MINUTES AS NEEDED FOR CHEST PAIN 25 tablet 3   oxyCODONE-acetaminophen (PERCOCET) 10-325 MG tablet Take 1 tablet by mouth every 8 (eight) hours as needed for pain. 21 tablet 0   pantoprazole (PROTONIX) 40 MG tablet TAKE 1 TABLET BY MOUTH DAILY 90 tablet 3   Polyvinyl Alcohol-Povidone (REFRESH OP) Place 1 drop into both eyes at bedtime as needed (dry eyes).     potassium chloride (MICRO-K) 10 MEQ CR capsule TAKE 1 CAPSULE BY MOUTH DAILY 90 capsule 3   Rimegepant Sulfate 75 MG TBDP Take 75 mg by mouth daily as needed (Migraine). 16 tablet 11   rosuvastatin (CRESTOR) 40 MG tablet TAKE 1 TABLET BY MOUTH DAILY 90 tablet 3   telmisartan (MICARDIS) 40 MG tablet TAKE 1 TABLET BY MOUTH DAILY 90 tablet 3   tirzepatide (MOUNJARO) 12.5 MG/0.5ML Pen Inject 12.5 mg into the skin once a week. 6 mL 2   No facility-administered medications prior to visit.    PAST MEDICAL HISTORY: Past Medical History:  Diagnosis Date   Allergic rhinitis    chronic sinusitis   Arthritis    osetoarthritis   CAD (coronary artery disease)    a. 2009 PCI/DES to mLAD; b. 05/2010 s/p DES RCA and LAD.; c. 05/2013 Cath: LAD mild ISR, LCX nl, RCA 40p, patent stents.   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   Cataract    has had lasik surg   COPD (chronic obstructive pulmonary disease) (HCC)    Depression    Diabetes mellitus type 2, controlled, without complications (HCC)    Diabetes mellitus without complication (HCC)    Phreesia  08/22/2020   Diverticulosis    DJD (degenerative joint disease)    Esophagitis 1991   grade 1   GERD (gastroesophageal reflux disease)    Hiatal hernia   Hearing loss    wears bilateral hearing aids  Heart murmur    Dr Tanya Nones dx per patient   Hemorrhoids    Hyperlipidemia    Hypertension    Hypertensive heart disease    Iron deficiency anemia    Migraines    Myocardial infarction [facility])    Paroxysmal atrial fibrillation (HCC)    a. s/p afib ablation 10/22/08 ([facility]. [facility]).   Presence of permanent cardiac pacemaker    PVC's (premature ventricular contractions)    Sleep apnea    Uses nightly.  Questionalble: RDI during the total sleep time 6h 28 mins was 3.55/hr during REM sleep was at 5.71/hr. Supine AHI was 5.93/hr.   Special screening for malignant neoplasms, colon 10/01/2022   Syncope 08/2018    PAST SURGICAL HISTORY: Past Surgical History:  Procedure Laterality Date   ABDOMINAL HYSTERECTOMY     CARDIAC CATHETERIZATION  05/31/2010   The proximal LAD was then predilated with 2.0 x 12 trek. this was then stented with a 2.5 x 16 promus Element drug-eluting stent at 14 atmosphere and prostdilated with 2.75 x 12 [place] trek at 16 atmospheres(2.8 mm) resulting the reduction of 80% proximal LAD stenosis to 0% residual with excellent flow.   CARDIAC CATHETERIZATION  06/05/2010   Successful percutaneous coronary intervention to the right coronary artery with percutaneous transluminal coronary angioplasty/stenting and insertion 3.0 x 16 mm Promus DES post dilated to 3.35 mm with stenosis being reduced to 0%   CARDIAC CATHETERIZATION  02/29/2008   Post dilatation was performed using a 2.75 x 9 [place] sprinter, 10 atmospheres for 40 seconds and then 9 atmosphere for 35 sec. This resulted in the 80% area of narrowing pre-intervention, now appearing to normal. There was no edvidence of the dissection or thrombus and there was TIMI III flow pre and post.   CARDIAC CATHETERIZATION  02/28/2006    COLONOSCOPY WITH PROPOFOL N/A 10/01/2022   Procedure: COLONOSCOPY WITH PROPOFOL;  Surgeon: Dolores Frame, MD;  Location: [facility];  Service: Gastroenterology;  Laterality: N/A;  8:00am;asa 3   CORONARY ANGIOPLASTY WITH STENT PLACEMENT     3 stents/ 4 caths   DISTAL BICEPS TENDON REPAIR Left 02/12/2022   Procedure: LEFT DISTAL BICEPS TENDON REPAIR;  Surgeon: Tarry Kos, MD;  Location: [facility] OR;  Service: Orthopedics;  Laterality: Left;   DISTAL BICEPS TENDON REPAIR Left 03/03/2022   Procedure: LEFT DISTAL BICEPS TENDON REPAIR;  Surgeon: Tarry Kos, MD;  Location: [facility] OR;  Service: Orthopedics;  Laterality: Left;   EYE SURGERY     FINGER ARTHROPLASTY Right 09/02/2021   Procedure: RIGHT THUMB CARPOMETACARPAL ARTHROPLASTY;  Surgeon: Tarry Kos, MD;  Location: [facility];  Service: Orthopedics;  Laterality: Right;  block in preop   KNEE ARTHROSCOPY     right   LASIK     LEFT HEART CATHETERIZATION WITH CORONARY ANGIOGRAM N/A 06/29/2013   Procedure: LEFT HEART CATHETERIZATION WITH CORONARY ANGIOGRAM;  Surgeon: Marykay Lex, MD;  Location: [facility] CATH LAB;  Service: Cardiovascular;  Laterality: N/A;   NASAL SINUS SURGERY  06/01/1999   NECK SURGERY     Cervical fusion   PACEMAKER IMPLANT N/A 05/01/2019   Procedure: PACEMAKER IMPLANT;  Surgeon: Hillis Range, MD;  Location: [facility] INVASIVE CV LAB;  Service: Cardiovascular;  Laterality: N/A;   RADIOFREQUENCY ABLATION  09/28/2008   atrial fibrillation   ROTATOR CUFF REPAIR     left   SHOULDER OPEN ROTATOR CUFF REPAIR     right   SPINE SURGERY     WRIST ARTHROCENTESIS  left    FAMILY HISTORY: Family History  Problem Relation Age of Onset   Colon cancer Mother        mets from uterine   Uterine cancer Mother    Heart disease Father        and sister   Diabetes Father    Hearing loss Father    Stroke Father    Heart disease Sister    Lung cancer Sister    Stroke Brother    Migraines Neg Hx     SOCIAL  HISTORY: Social History   Socioeconomic History   Marital status: Married    Spouse name: Dois Davenport   Number of children: 2   Years of education: Not on file   Highest education level: Not on file  Occupational History   Occupation: retired  Tobacco Use   Smoking status: Former    Packs/day: 2.00    Years: 25.00    Additional pack years: 0.00    Total pack years: 50.00    Types: Cigarettes, Cigars    Quit date: 04/22/1988    Years since quitting: 34.5    Passive exposure: Past   Smokeless tobacco: Never  Vaping Use   Vaping Use: Never used  Substance and Sexual Activity   Alcohol use: Not Currently   Drug use: No   Sexual activity: Not Currently    Birth control/protection: Surgical    Comment: vas  Other Topics Concern   Not on file  Social History Narrative   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   Currently works part time for [employer].   2 daughters.    Married x 43 years 09/2021.   Caffiene:  1 cup daily, 2 diet drinks daily. (Caffiene free most time).    Social Determinants of Health   Financial Resource Strain: Low Risk  (09/02/2022)   Overall Financial Resource Strain (CARDIA)    Difficulty of Paying Living Expenses: Not hard at all  Food Insecurity: No Food Insecurity (09/02/2022)   Hunger Vital Sign    Worried About Running Out of Food in the Last Year: Never true    Ran Out of Food in the Last Year: Never true  Transportation Needs: No Transportation Needs (09/02/2022)   PRAPARE - Administrator, Civil Service (Medical): No    Lack of Transportation (Non-Medical): No  Physical Activity: Insufficiently Active (09/02/2022)   Exercise Vital Sign    Days of Exercise per Week: 3 days    Minutes of Exercise per Session: 40 min  Stress: No Stress Concern Present (09/02/2022)   [employer] of Occupational Health - Occupational Stress Questionnaire    Feeling of Stress : Not at all  Social Connections: Socially Integrated  (09/02/2022)   Social Connection and Isolation Panel [NHANES]    Frequency of Communication with Friends and Family: More than three times a week    Frequency of Social Gatherings with Friends and Family: Once a week    Attends Religious Services: More than 4 times per year    Active Member of [employer] or Organizations: Yes    Attends Banker Meetings: More than 4 times per year    Marital Status: Married  Catering manager Violence: Not At Risk (09/02/2022)   Humiliation, Afraid, Rape, and Kick questionnaire    Fear of Current or Ex-Partner: No    Emotionally Abused: No    Physically Abused: No    Sexually Abused: No  PHYSICAL EXAM  Vitals:   11/08/22 1046  BP: 120/85  Pulse: 83  Weight: 203 lb 9.6 oz (92.4 kg)  Height: 5\' 8"  (1.727 m)   Body mass index is 30.96 kg/m.  Generalized: Well developed, in no acute distress   Neurological examination  Mentation: Alert oriented to time, place, history taking. Follows all commands speech and language fluent Cranial nerve II-XII: Pupils were equal round reactive to light. Extraocular movements were full, visual field were full on confrontational test. Facial sensation and strength were normal.  Head turning and shoulder shrug  were normal and symmetric. Motor: The motor testing reveals 5 over 5 strength of all 4 extremities. Good symmetric motor tone is noted throughout.  Sensory: Sensory testing is intact to soft touch on all 4 extremities. No evidence of extinction is noted.  Coordination: Cerebellar testing reveals good finger-nose-finger and heel-to-shin bilaterally.  Gait and station: Gait is normal. DIAGNOSTIC DATA (LABS, IMAGING, TESTING) - I reviewed patient records, labs, notes, testing and imaging myself where available.  Lab Results  Component Value Date   WBC 6.4 06/22/2022   HGB 15.2 06/22/2022   HCT 45.2 06/22/2022   MCV 88.1 06/22/2022   PLT 264 06/22/2022      Component Value Date/Time   NA 132 (L)  10/01/2022 0721   K 4.2 10/01/2022 0721   CL 94 (L) 10/01/2022 0721   CO2 26 10/01/2022 0721   GLUCOSE 93 10/01/2022 0721   BUN 11 10/01/2022 0721   CREATININE 0.80 10/01/2022 0721   CREATININE 0.75 06/22/2022 0823   CALCIUM 9.1 10/01/2022 0721   PROT 6.6 06/22/2022 0823   ALBUMIN 3.9 05/01/2019 0358   AST 12 06/22/2022 0823   ALT 14 06/22/2022 0823   ALKPHOS 71 05/01/2019 0358   BILITOT 0.4 06/22/2022 0823   GFRNONAA >60 10/01/2022 0721   GFRNONAA 88 08/18/2020 0803   GFRAA 102 08/18/2020 0803   Lab Results  Component Value Date   CHOL 115 06/22/2022   HDL 56 06/22/2022   LDLCALC 44 06/22/2022   TRIG 69 06/22/2022   CHOLHDL 2.1 06/22/2022   Lab Results  Component Value Date   HGBA1C 6.2 (H) 06/22/2022   Lab Results  Component Value Date   VITAMINB12 566 06/22/2022   Lab Results  Component Value Date   TSH 1.200 05/01/2019      ASSESSMENT AND PLAN 72 y.o. year old male  has a past medical history of Allergic rhinitis, Arthritis, CAD (coronary artery disease), Cancer (HCC) (02/24/2022), Cataract, COPD (chronic obstructive pulmonary disease) (HCC), Depression, Diabetes mellitus type 2, controlled, without complications (HCC), Diabetes mellitus without complication (HCC), Diverticulosis, DJD (degenerative joint disease), Esophagitis (1991), GERD (gastroesophageal reflux disease), Hearing loss, Heart murmur, Hemorrhoids, Hyperlipidemia, Hypertension, Hypertensive heart disease, Iron deficiency anemia, Migraines, Myocardial infarction (HCC), Paroxysmal atrial fibrillation (HCC), Presence of permanent cardiac pacemaker, PVC's (premature ventricular contractions), Sleep apnea, Special screening for malignant neoplasms, colon (10/01/2022), and Syncope (08/2018). here with:  1.  Migraine headaches  Stop Emgality last injection was June 1 Start Qulipta 30 mg daily Advised to limit his use of over-the-counter medication as this could be causing a rebound headache.   Continue  Nurtec for abortive therapy Advised to call if headaches do not improve      Butch Penny, MSN, NP-C 11/08/2022, 10:51 AM [facility] [address] [place], [place] [address] [phone]

## 2022-11-08 NOTE — Patient Instructions (Signed)
Your Plan:  Start Qulipta 30 mg daily  Stop EMGALITY   Continue Nurtec for abortive therapy If your symptoms worsen or you develop new symptoms please let us know.    Thank you for coming to see [place] at [facility]. I hope we have been able to provide you high quality care today.  You may receive a patient satisfaction survey over the next few weeks. We would appreciate your feedback and comments so that we may continue to improve ourselves and the health of our patients.

## 2022-11-15 ENCOUNTER — Other Ambulatory Visit (HOSPITAL_COMMUNITY): Payer: Self-pay

## 2022-11-19 ENCOUNTER — Other Ambulatory Visit (HOSPITAL_COMMUNITY): Payer: Self-pay

## 2022-11-19 ENCOUNTER — Telehealth: Payer: Self-pay

## 2022-11-19 ENCOUNTER — Other Ambulatory Visit: Payer: Self-pay | Admitting: Family Medicine

## 2022-11-19 DIAGNOSIS — G4700 Insomnia, unspecified: Secondary | ICD-10-CM

## 2022-11-19 NOTE — Telephone Encounter (Signed)
Pharmacy Patient Advocate Encounter   Received notification from [employer] that prior authorization for Qulipta 30MG  tablets is required/requested.   PA submitted to [facility] via CoverMyMeds Key or (Medicaid) confirmation # [id number] Status is pending

## 2022-11-23 NOTE — Telephone Encounter (Signed)
Pharmacy Patient Advocate Encounter  Prior Authorization for Qulipta 30MG  tablets has been APPROVED by [facility] from 11/19/2022 to 05/31/2023.   PA # PA Case ID: [id number] Unable to provide copay estimate at this time due to systems are down.

## 2022-11-25 ENCOUNTER — Other Ambulatory Visit (HOSPITAL_COMMUNITY): Payer: Self-pay

## 2022-11-25 ENCOUNTER — Other Ambulatory Visit: Payer: Self-pay

## 2022-11-26 ENCOUNTER — Ambulatory Visit (INDEPENDENT_AMBULATORY_CARE_PROVIDER_SITE_OTHER): Payer: Medicare Other

## 2022-11-26 ENCOUNTER — Other Ambulatory Visit (HOSPITAL_COMMUNITY): Payer: Self-pay

## 2022-11-26 DIAGNOSIS — I442 Atrioventricular block, complete: Secondary | ICD-10-CM

## 2022-11-26 LAB — CUP PACEART REMOTE DEVICE CHECK
Battery Remaining Longevity: 62 mo
Battery Remaining Percentage: 63 %
Battery Voltage: 2.99 V
Brady Statistic AP VP Percent: 1 %
Brady Statistic AP VS Percent: 1 %
Brady Statistic AS VP Percent: 99 %
Brady Statistic AS VS Percent: 1 %
Brady Statistic RA Percent Paced: 1 %
Brady Statistic RV Percent Paced: 99 %
Date Time Interrogation Session: 20240628025919
Implantable Lead Connection Status: 753985
Implantable Lead Connection Status: 753985
Implantable Lead Implant Date: 20201201
Implantable Lead Implant Date: 20201201
Implantable Lead Location: 753859
Implantable Lead Location: 753860
Implantable Pulse Generator Implant Date: 20201201
Lead Channel Impedance Value: 460 Ohm
Lead Channel Impedance Value: 460 Ohm
Lead Channel Pacing Threshold Amplitude: 0.75 V
Lead Channel Pacing Threshold Amplitude: 1 V
Lead Channel Pacing Threshold Pulse Width: 0.5 ms
Lead Channel Pacing Threshold Pulse Width: 0.5 ms
Lead Channel Sensing Intrinsic Amplitude: 2.2 mV
Lead Channel Sensing Intrinsic Amplitude: 2.4 mV
Lead Channel Setting Pacing Amplitude: 2 V
Lead Channel Setting Pacing Amplitude: 2.5 V
Lead Channel Setting Pacing Pulse Width: 0.5 ms
Lead Channel Setting Sensing Sensitivity: 2 mV
Pulse Gen Model: 2272
Pulse Gen Serial Number: 9182765

## 2022-11-26 MED ORDER — TRULICITY 3 MG/0.5ML ~~LOC~~ SOAJ
3.0000 mg | SUBCUTANEOUS | 2 refills | Status: DC
  Filled 2022-11-26: qty 2, 28d supply, fill #0

## 2022-11-26 MED ORDER — DOXYCYCLINE HYCLATE 100 MG PO CAPS
100.0000 mg | ORAL_CAPSULE | Freq: Every day | ORAL | 2 refills | Status: DC
  Filled 2022-11-26: qty 30, 30d supply, fill #0

## 2022-11-26 MED ORDER — METRONIDAZOLE 0.75 % EX CREA
TOPICAL_CREAM | Freq: Two times a day (BID) | CUTANEOUS | 3 refills | Status: DC
  Filled 2022-11-26: qty 45, 30d supply, fill #0

## 2022-11-26 MED ORDER — MOUNJARO 10 MG/0.5ML ~~LOC~~ SOAJ
10.0000 mg | SUBCUTANEOUS | 6 refills | Status: DC
  Filled 2022-11-26: qty 6, 84d supply, fill #0

## 2022-11-26 MED ORDER — MOUNJARO 7.5 MG/0.5ML ~~LOC~~ SOAJ: 7.5000 mg | SUBCUTANEOUS | 1 refills | Status: DC

## 2022-11-26 MED FILL — Hydroxyzine Pamoate Cap 25 MG: ORAL | 30 days supply | Qty: 30 | Fill #0 | Status: CN

## 2022-11-29 ENCOUNTER — Other Ambulatory Visit: Payer: Self-pay

## 2022-12-09 NOTE — Progress Notes (Signed)
Remote pacemaker transmission.   

## 2022-12-14 ENCOUNTER — Other Ambulatory Visit (HOSPITAL_COMMUNITY): Payer: Self-pay

## 2022-12-14 ENCOUNTER — Other Ambulatory Visit: Payer: Self-pay

## 2022-12-20 ENCOUNTER — Other Ambulatory Visit (HOSPITAL_COMMUNITY): Payer: Self-pay

## 2022-12-20 ENCOUNTER — Other Ambulatory Visit: Payer: Self-pay

## 2022-12-28 ENCOUNTER — Encounter: Payer: Self-pay | Admitting: Podiatry

## 2022-12-28 ENCOUNTER — Ambulatory Visit (INDEPENDENT_AMBULATORY_CARE_PROVIDER_SITE_OTHER): Payer: Medicare Other | Admitting: Podiatry

## 2022-12-28 DIAGNOSIS — D2371 Other benign neoplasm of skin of right lower limb, including hip: Secondary | ICD-10-CM

## 2022-12-28 DIAGNOSIS — D2372 Other benign neoplasm of skin of left lower limb, including hip: Secondary | ICD-10-CM

## 2022-12-28 NOTE — Progress Notes (Signed)
Presents today for a callus trim.  He states that he has lost several pounds with Mounjaro and all of his painful calluses went away with the exception of one new wound that developed on his left heel he states that his increase in activity is most likely cause for this.  Objective: Vital signs stable alert oriented x 3 pulses are palpable no preulcerative lesions no open lesions or wounds are noted.  He does have a small porokeratotic lesion on benign skin lesion to the posterior plantar lateral aspect of the heel left.  This was enucleated and I did not demonstrate any iatrogenic lesions.  Assessment: Pain in limb secondary to benign skin lesion and diabetes.  Plan: Debridement of the lesion today with mechanical means and then chemical destruction of the lesion with Salinocaine under occlusion to be washed off thoroughly in 3 days.  Follow-up with him as needed

## 2023-01-18 ENCOUNTER — Other Ambulatory Visit: Payer: Self-pay

## 2023-01-18 ENCOUNTER — Other Ambulatory Visit: Payer: Self-pay | Admitting: Family Medicine

## 2023-01-18 ENCOUNTER — Encounter: Payer: Self-pay | Admitting: Family Medicine

## 2023-01-18 DIAGNOSIS — E118 Type 2 diabetes mellitus with unspecified complications: Secondary | ICD-10-CM

## 2023-01-18 MED ORDER — ACCU-CHEK GUIDE VI STRP
ORAL_STRIP | 2 refills | Status: DC
Start: 2023-01-18 — End: 2023-11-07

## 2023-01-19 NOTE — Telephone Encounter (Signed)
Requested medication (s) are due for refill today: yes  Requested medication (s) are on the active medication list: yes  Last refill:  10/20/22 #90  Future visit scheduled:no  Notes to clinic:  overdue lab work   Requested Prescriptions  Pending Prescriptions Disp Refills   JARDIANCE 25 MG TABS tablet [Pharmacy Med Name: JARDIANCE 25MG  TABLETS] 90 tablet 0    Sig: TAKE 1 TABLET BY MOUTH DAILY BEFORE BREAKFAST     Endocrinology:  Diabetes - SGLT2 Inhibitors Failed - 01/19/2023  1:38 PM      Failed - HBA1C is between 0 and 7.9 and within 180 days    Hgb A1c MFr Bld  Date Value Ref Range Status  06/22/2022 6.2 (H) <5.7 % of total Hgb Final    Comment:    For someone without known diabetes, a hemoglobin  A1c value between 5.7% and 6.4% is consistent with prediabetes and should be confirmed with a  follow-up test. . For someone with known diabetes, a value <7% indicates that their diabetes is well controlled. A1c targets should be individualized based on duration of diabetes, age, comorbid conditions, and other considerations. . This assay result is consistent with an increased risk of diabetes. . Currently, no consensus exists regarding use of hemoglobin A1c for diagnosis of diabetes for children. .          Failed - Valid encounter within last 6 months    Recent Outpatient Visits           1 year ago Coronary artery disease involving native coronary artery of native heart without angina pectoris   [facility] Donita Brooks, MD   1 year ago Type 2 diabetes mellitus with complication, without long-term current use of insulin (HCC)   [facility] Pickard, Priscille Heidelberg, MD   2 years ago Wrist pain, chronic, right   [facility] Pickard, Priscille Heidelberg, MD   2 years ago General medical exam   [facility] Donita Brooks, MD   2 years ago Type 2 diabetes mellitus with complication, without long-term current use  of insulin (HCC)   [facility] Pickard, Priscille Heidelberg, MD       Future Appointments             In 2 days Lennette Bihari, MD [facility]   In 5 months Pickard, Priscille Heidelberg, MD [facility]            Passed - Cr in normal range and within 360 days    Creat  Date Value Ref Range Status  06/22/2022 0.75 0.70 - 1.28 mg/dL Final   Creatinine, Ser  Date Value Ref Range Status  10/01/2022 0.80 0.61 - 1.24 mg/dL Final   Creatinine, Urine  Date Value Ref Range Status  06/25/2022 17 (L) 20 - 320 mg/dL Final         Passed - eGFR in normal range and within 360 days    GFR, Est African American  Date Value Ref Range Status  08/18/2020 102 > OR = 60 mL/min/1.74m2 Final   GFR, Est Non African American  Date Value Ref Range Status  08/18/2020 88 > OR = 60 mL/min/1.63m2 Final   GFR, Estimated  Date Value Ref Range Status  10/01/2022 >60 >60 mL/min Final    Comment:    (NOTE) Calculated using the CKD-EPI Creatinine Equation (2021)  eGFR  Date Value Ref Range Status  06/22/2022 96 > OR = 60 mL/min/1.24m2 Final

## 2023-01-20 NOTE — Progress Notes (Signed)
Patient ID: Lee [name], male   DOB: [birth year], 72 y.o.   MRN: [id number]      Primary M.D.: Lee [name]  HPI: Lee [name], is a 72 y.o. male who presents to the office today for an 8 month followup cardiology evaluation.  Lee [name] has known CAD and in 2009 underwent stenting of his mid LAD. In 2012 he underwent staged intervention with stenting of his proximal RCA and at a new site in his proximal LAD. A nuclear perfusion study February 2014 showed normal perfusion and function. In January 2015, he was seen by Lee [name] in the office with complaints of chest pressure. He underwent cardiac catheterization this chest pain which was done by Dr. Bryan [name] on 06/29/2013. Catheterization did not demonstrate significant restenosis of his LAD stents with mild 20% narrowing in the proximal stent and 40% mid stent. The circumflex was normal. His RCA had 40% narrowing just proximal to a widely patent stent in the mid vessel. Nitrate therapy was added to his medical regimen but due to significant nitrate mediated headaches this ultimately was discontinued.   In 2007, a sleep study  suggested upper airway resistance syndrome without overall sleep apnea with the exception of mild sleep apnea with REM sleep; at that time CPAP was not recommended. In June 2014 he was having symptoms suggestive of progressive sleep apnea. He ultimately underwent a sleep study. I do not have the specifics of this diagnostic polysomnogram but subsequently he did undergo a CPAP titration to apparently his AHI was 12 on his initial study and he dropped his oxygen from 96% to 83%. He ultimately was titrated up to a CPAP pressure of 11 cm.  Additional problems also include obesity , as well as a history of palpitations.  I had titrated his beta blocker therapy which improved his palpitations.  He remains active.  He denies any episodes of chest pain.  He denies presyncope or syncope.  A follow-up myocardial  perfusion study on 09/23/2015 showed an ejection fraction at 62%.  There were no ECG changes.  He had normal perfusion without scar or ischemia.  I  saw him in June 2018 at which time was experiencing occasional palpitations.  We discussed reduction of caffeine and I added low-dose Cardizem CD to his regimen.    When I  saw him in July 2019 he was doing well and was without recurrent chest pain.  He was continuing to use CPAP with 1 or percent compliance; [facility] his DME.   He was hospitalized in April 2020 with syncope  suspected to be secondary to a vagal episode.  He was noted to have significant sinus bradycardia and diltiazem was stopped.  An echo Doppler study on September 20, 2018 showed an EF of 60 to 65% with grade 2 diastolic dysfunction, clinical vacation and aortic valve sclerosis.  He was discharged home with plans for monitoring on Coreg.  He was admitted on Oct 10, 2018 with recurrent syncope and was found to have significant pauses on his monitor and had a 36 seconds strip showing multiple pauses lasting 6, 7, 9, and 12 seconds.  He was initially found by EMS in complete heart block with a ventricular rate in the 20s which improved to 2-1 and eventually sinus rhythm.  He was admitted to the hospital, carvedilol was discontinued.  He was  evaluated by Lee [name] in the office in June 2020 and was doing well without further symptoms of bradycardia,  palpitations chest pain shortness of breath dizziness presyncope or syncope.  On May 01, 2019 he underwent insertion of a Lee [name] dual-chamber permanent pacemaker by Lee [name] for symptomatic Mobitz 2 second-degree AV block.   I saw him in March 2021.  Since I last saw him, he received a new ResMed air sense 10 CPAP unit.  [facility] is his DME provider and had not yet been linked to our office.  He has had issues with sleep initiation but notices significant improvement with his CPAP device.  With difficulty with sleep  initiation he was prescribed zolpidem 5 mg to take as needed.  I saw him on Oct 21, 2020.  Since his prior patient remained stable and denies any chest pain or shortness of breath.  He was continue to use CPAP therapy and typically was going to bed  at 11 PM but is up between 4 and 5 AM.  I obtained a new download  from September 22, 2020 through Oct 21, 2020.  Usage days excellent at 97%.  He was averaging 5 hours and 30 minutes of CPAP use per night.  At14 cm set pressure, AHI is 1.  He felt well and was unaware of any breakthrough snoring.  He had undergone laboratory by Dr. Tanya [name] and hemoglobin A1c was 6.6.  He continues to be stable on rosuvastatin 40 mg hyperlipidemia and LDL cholesterol was 61 in March 2022.  I last saw him on March 29, 2022. He remained stable.  He continues to be evaluated by EP and undergoes remote device interrogation of his pacemaker which has been stable.  Since his last evaluation he continues to use CPAP but apparently has been using his daughter's CPAP machine.  He now has a Systems developer which is working well.  He recently required surgery for ruptured of the left distal biceps tendon and tolerated that well.  He admits to purposeful weight loss and has lost approximately 20 pounds.  He continues to be on amlodipine 10 mg, furosemide 40 mg daily, and telmisartan 40 mg daily for hypertension and is on Jardiance in addition to Trulicity for diabetes.  He remains active and denies chest pain or shortness of breath.    Since I last saw him, he has been evaluated by Lee Dowse, PA-C and EP on August 09, 2022 and remained fairly stable.  He underwent a repeat echo Doppler study on September 03, 2022 which showed normal LV function with EF 60 to 65% and grade 1 diastolic dysfunction.  He had mild aortic valve stenosis with a mean gradient of 16.2 peak gradient 26.1 mmHg.  Presently, Lee [name] remains asymptomatic.  He denies recurrent chest pain or shortness of breath.  He  denies any recent palpitations.  Continues to use CPAP and he alternates where he sleeps either in the living room or in the bedroom.  He has his daughter CPAP in the living room and his CPAP in the bedroom.  He admits to 100% usage.  We did obtain a download off one of the devices she is consistent and showed use on that device at 8 over 30 days but he sizes that he uses CPAP every night, however, usage duration is suboptimal and he states he typically goes to bed around 11:30 and may wake up at 4:00.  CPAP unit is set at a pressure of 14 cm.  He undergoes device checks for his pacemaker followed by Dr. Elberta Fortis with most recent showing normal battery  and lead parameters.  Past Medical History:  Diagnosis Date   Allergic rhinitis    chronic sinusitis   Arthritis    osetoarthritis   CAD (coronary artery disease)    a. 2009 PCI/DES to mLAD; b. 05/2010 s/p DES RCA and LAD.; c. 05/2013 Cath: LAD mild ISR, LCX nl, RCA 40p, patent stents.   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   Cataract    has had lasik surg   COPD (chronic obstructive pulmonary disease) (HCC)    Depression    Diabetes mellitus type 2, controlled, without complications (HCC)    Diabetes mellitus without complication (HCC)    Phreesia 08/22/2020   Diverticulosis    DJD (degenerative joint disease)    Esophagitis 1991   grade 1   GERD (gastroesophageal reflux disease)    Hiatal hernia   Hearing loss    wears bilateral hearing aids   Heart murmur    Dr Lee [name] dx per patient   Hemorrhoids    Hyperlipidemia    Hypertension    Hypertensive heart disease    Iron deficiency anemia    Migraines    Myocardial infarction [facility])    Paroxysmal atrial fibrillation (HCC)    a. s/p afib ablation 10/22/08 (J. Allred).   Presence of permanent cardiac pacemaker    PVC's (premature ventricular contractions)    Sleep apnea    Uses nightly.  Questionalble: RDI during the total sleep time 6h 28 mins was 3.55/hr  during REM sleep was at 5.71/hr. Supine AHI was 5.93/hr.   Special screening for malignant neoplasms, colon 10/01/2022   Syncope 08/2018    Past Surgical History:  Procedure Laterality Date   ABDOMINAL HYSTERECTOMY     CARDIAC CATHETERIZATION  05/31/2010   The proximal LAD was then predilated with 2.0 x 12 trek. this was then stented with a 2.5 x 16 promus Element drug-eluting stent at 14 atmosphere and prostdilated with 2.75 x 12 [place] trek at 16 atmospheres(2.8 mm) resulting the reduction of 80% proximal LAD stenosis to 0% residual with excellent flow.   CARDIAC CATHETERIZATION  06/05/2010   Successful percutaneous coronary intervention to the right coronary artery with percutaneous transluminal coronary angioplasty/stenting and insertion 3.0 x 16 mm Promus DES post dilated to 3.35 mm with stenosis being reduced to 0%   CARDIAC CATHETERIZATION  02/29/2008   Post dilatation was performed using a 2.75 x 9 Imperial sprinter, 10 atmospheres for 40 seconds and then 9 atmosphere for 35 sec. This resulted in the 80% area of narrowing pre-intervention, now appearing to normal. There was no edvidence of the dissection or thrombus and there was TIMI III flow pre and post.   CARDIAC CATHETERIZATION  02/28/2006   COLONOSCOPY WITH PROPOFOL N/A 10/01/2022   Procedure: COLONOSCOPY WITH PROPOFOL;  Surgeon: Dolores Frame, MD;  Location: [facility];  Service: Gastroenterology;  Laterality: N/A;  8:00am;asa 3   CORONARY ANGIOPLASTY WITH STENT PLACEMENT     3 stents/ 4 caths   DISTAL BICEPS TENDON REPAIR Left 02/12/2022   Procedure: LEFT DISTAL BICEPS TENDON REPAIR;  Surgeon: Tarry Kos, MD;  Location: [facility] OR;  Service: Orthopedics;  Laterality: Left;   DISTAL BICEPS TENDON REPAIR Left 03/03/2022   Procedure: LEFT DISTAL BICEPS TENDON REPAIR;  Surgeon: Tarry Kos, MD;  Location: [facility] OR;  Service: Orthopedics;  Laterality: Left;   EYE SURGERY     FINGER ARTHROPLASTY Right 09/02/2021   Procedure: RIGHT THUMB  CARPOMETACARPAL  ARTHROPLASTY;  Surgeon: Tarry Kos, MD;  Location: [facility];  Service: Orthopedics;  Laterality: Right;  block in preop   KNEE ARTHROSCOPY     right   LASIK     LEFT HEART CATHETERIZATION WITH CORONARY ANGIOGRAM N/A 06/29/2013   Procedure: LEFT HEART CATHETERIZATION WITH CORONARY ANGIOGRAM;  Surgeon: Marykay Lex, MD;  Location: [facility] CATH LAB;  Service: Cardiovascular;  Laterality: N/A;   NASAL SINUS SURGERY  06/01/1999   NECK SURGERY     Cervical fusion   PACEMAKER IMPLANT N/A 05/01/2019   Procedure: PACEMAKER IMPLANT;  Surgeon: Hillis Range, MD;  Location: [facility] INVASIVE CV LAB;  Service: Cardiovascular;  Laterality: N/A;   RADIOFREQUENCY ABLATION  09/28/2008   atrial fibrillation   ROTATOR CUFF REPAIR     left   SHOULDER OPEN ROTATOR CUFF REPAIR     right   SPINE SURGERY     WRIST ARTHROCENTESIS     left    Allergies  Allergen Reactions   Ibuprofen Swelling    Whole body swelled   Other Shortness Of Breath    BETA BLOCKERS   Topamax [Topiramate] Other (See Comments)    Pt states it made his tongue feel "scalded" and he couldn't eat    Toprol Xl [Metoprolol Succinate] Other (See Comments)    Personality change   Metoprolol-Hctz Er Other (See Comments)    Indigestion,mouth raw    Current Outpatient Medications  Medication Sig Dispense Refill   Accu-Chek FastClix Lancets MISC Check blood sugar 2 (two) times daily as directed 102 each 5   acetaminophen (TYLENOL) 325 MG tablet Take 1-2 tablets (325-650 mg total) by mouth every 4 (four) hours as needed for mild pain.     amLODipine (NORVASC) 10 MG tablet Take 1 tablet (10 mg total) by mouth daily. 90 tablet 1   aspirin EC 81 MG tablet Take 81 mg by mouth at bedtime.     Atogepant (QULIPTA) 30 MG TABS Take 1 tablet (30 mg total) by mouth daily. 30 tablet 11   Blood Glucose Monitoring Suppl (ACCU-CHEK AVIVA PLUS) w/Device KIT Check BS bid E11.9 1 kit 1   cholecalciferol (VITAMIN D) 1000 UNITS  tablet Take 1,000 Units by mouth at bedtime.      citalopram (CELEXA) 40 MG tablet Take 1 tablet (40 mg total) by mouth daily. 90 tablet 1   Coenzyme Q10 (CO Q 10) 100 MG CAPS Take 100 mg by mouth daily with breakfast.      empagliflozin (JARDIANCE) 25 MG TABS tablet TAKE 1 TABLET BY MOUTH DAILY BEFORE BREAKFAST 90 tablet 0   fexofenadine (ALLEGRA) 180 MG tablet Take 180 mg by mouth daily with breakfast.      fluticasone (FLONASE) 50 MCG/ACT nasal spray INSTILL 2 SPRAYS IN THE  NOSE AT BEDTIME 48 g 3   furosemide (LASIX) 40 MG tablet TAKE 1 TABLET BY MOUTH DAILY 90 tablet 3   glucose blood (ACCU-CHEK GUIDE) test strip Use to check blood sugar levels twice a day. 200 strip 2   hydrOXYzine (VISTARIL) 25 MG capsule Take 1 capsule (25 mg total) by mouth at bedtime as needed. 30 capsule 1   Lancets Misc. (ACCU-CHEK FASTCLIX LANCET) KIT USE AS DIRECTED TO CHECK BLOOD SUGAR TWICE DAILY 1 kit 1   metroNIDAZOLE (METROCREAM) 0.75 % cream Apply topically 2 (two) times daily to forehead, nose and cheeks 45 g 3   Multiple Vitamin (MULTIVITAMIN WITH MINERALS) TABS tablet Take 1 tablet by mouth daily with  breakfast.     oxyCODONE-acetaminophen (PERCOCET) 10-325 MG tablet Take 1 tablet by mouth every 8 (eight) hours as needed for pain. 21 tablet 0   pantoprazole (PROTONIX) 40 MG tablet TAKE 1 TABLET BY MOUTH DAILY 90 tablet 3   Polyvinyl Alcohol-Povidone (REFRESH OP) Place 1 drop into both eyes at bedtime as needed (dry eyes).     potassium chloride (MICRO-K) 10 MEQ CR capsule TAKE 1 CAPSULE BY MOUTH DAILY 90 capsule 3   Rimegepant Sulfate 75 MG TBDP Take 75 mg by mouth daily as needed (Migraine). 16 tablet 11   rosuvastatin (CRESTOR) 40 MG tablet TAKE 1 TABLET BY MOUTH DAILY 90 tablet 3   telmisartan (MICARDIS) 40 MG tablet TAKE 1 TABLET BY MOUTH DAILY 90 tablet 3   tirzepatide (MOUNJARO) 10 MG/0.5ML Pen Inject 10 mg into the skin once a week. 6 mL 6   doxycycline (VIBRAMYCIN) 100 MG capsule Take 1 capsule (100  mg total) by mouth daily with food until clear then as needed for flare (Patient not taking: Reported on 01/21/2023) 30 capsule 2   nitroGLYCERIN (NITROSTAT) 0.4 MG SL tablet PLACE ONE TABLET UNDER THE TONGUE EVERY 5 MINUTES AS NEEDED FOR CHEST PAIN (Patient not taking: Reported on 01/21/2023) 25 tablet 3   tirzepatide (MOUNJARO) 12.5 MG/0.5ML Pen Inject 12.5 mg into the skin once a week. (Patient not taking: Reported on 01/21/2023) 6 mL 2   tirzepatide (MOUNJARO) 7.5 MG/0.5ML Pen Inject 7.5 mg into the skin once a week. (Patient not taking: Reported on 01/21/2023) 6 mL 1   No current facility-administered medications for this visit.    Socially he's married has 2 children he works in Environmental education officer. There is a remote tobacco history but he quit in 1989.  ROS General: Negative; No fevers, chills, or night sweats; positive for obesity HEENT: Negative; No changes in vision or hearing, sinus congestion, difficulty swallowing Pulmonary: Negative; No cough, wheezing, shortness of breath, hemoptysis Cardiovascular: See history of present illness;  GI: Negative; No nausea, vomiting, diarrhea, or abdominal pain GU: Positive for GERD; No dysuria, hematuria, or difficulty voiding Musculoskeletal: Status post rupture of the left distal biceps tendon status post revision and repair March 03, 2022. Hematologic/Oncology: Negative; no easy bruising, bleeding Endocrine: Negative; no heat/cold intolerance; no diabetes Neuro: Positive for history of migraine headaches; no changes in balance,  Skin: Negative; No rashes or skin lesions Psychiatric: Negative; No behavioral problems, depression Sleep: Positive for obstructive sleep apnea,  on CPAP therapy; No snoring, daytime sleepiness, hypersomnolence, bruxism, restless legs, hypnogognic hallucinations, no cataplexy Other comprehensive 14 point system review is negative.  PE BP 124/84   Pulse 82   Ht 5\' 8"  (1.727 m)   Wt 202 lb 9.6 oz (91.9 kg)   SpO2 99%    BMI 30.81 kg/m   Morbid obesity  Repeat blood pressure by me was 122/68.  Wt Readings from Last 3 Encounters:  01/21/23 202 lb 9.6 oz (91.9 kg)  11/08/22 203 lb 9.6 oz (92.4 kg)  10/01/22 199 lb (90.3 kg)    General: Alert, oriented, no distress.  Skin: normal turgor, no rashes, warm and dry HEENT: Normocephalic, atraumatic. Pupils equal round and reactive to light; sclera anicteric; extraocular muscles intact;  Nose without nasal septal hypertrophy Mouth/Parynx benign; Mallinpatti scale 3 Neck: No JVD, no carotid bruits; normal carotid upstroke Lungs: clear to ausculatation and percussion; no wheezing or rales Chest wall: without tenderness to palpitation Heart: PMI not displaced, RRR, s1 s2 normal, 1-2/6 systolic murmur,  no diastolic murmur, no rubs, gallops, thrills, or heaves Abdomen: soft, nontender; no hepatosplenomehaly, BS+; abdominal aorta nontender and not dilated by palpation. Back: no CVA tenderness Pulses 2+ Musculoskeletal: full range of motion, normal strength, no joint deformities Extremities: no clubbing cyanosis or edema, Homan's sign negative  Neurologic: grossly nonfocal; Cranial nerves grossly wnl Psychologic: Normal mood and affect   EKG Interpretation Date/Time:  Friday January 21 2023 07:55:59 EDT Ventricular Rate:  82 PR Interval:  250 QRS Duration:  176 QT Interval:  450 QTC Calculation: 525 R Axis:   266  Text Interpretation: Atrial-sensed ventricular-paced rhythm with prolonged AV conduction When compared with ECG of 02-May-2019 05:12, Electronic ventricular pacemaker has replaced Sinus rhythm Confirmed by Nicki Guadalajara ([id number]) on 01/21/2023 8:16:44 AM     March 29, 2022   ECG (independently read by me): Atrial sensed, V paced at 76 , PR 244 msec  Oct 21, 2020 ECG (independently read by me):  Atrial-sensed and ventricular paced rhythm with prolonged AV conduction, PR interval 240 ms.  March 2021 ECG (independently read by me): Atrial-sensed  ventricular paced rhythm with prolonged AV conduction with PR interval 246 ms.  September 2020 ECG (independently read by me): Sinus rhythm at 83 bpm with first-degree AV block with a PR of 220 ms.  Right bundle branch block with repolarization changes.  July 2019 ECG (independently read by me): Normal sinus rhythm at 66 bpm.  First-degree AV block.  Right bundle branch block with repolarization changes.  June 2018 ECG (independently read by me): Normal sinus rhythm at 65 bpm.  First degree AV block.  Right bundle branch block with repolarization changes.  PR interval 218 ms.  July 2017 ECG (independently read by me): Sinus rhythm at 69 bpm with right bundle branch block and first-degree AV block.  Q wave is present in lead 3.  April 2016 ECG (independently read by me): Normal sinus rhythm at 68 with right bundle branch block and repolarization changes.  First-degree AV block.  No ectopy  October 2015 ECG (independently read by me): Normal sinus rhythm with right bundle branch block.  Borderline first-degree AV block with PR interval 206 ms.  No ectopy  Prior February 2015 ECG (independently read by me): Normal sinus rhythm at 66 beats per minute, right bundle branch block with repolarization changes, first-degree AV block.  ECG from 12/12/2012: NSR at 69, RBBB  LABS:     Latest Ref Rng & Units 10/01/2022    7:21 AM 06/22/2022    8:23 AM 02/12/2022    9:55 AM  BMP  Glucose 70 - 99 mg/dL 93  98  213   BUN 8 - 23 mg/dL 11  15  14    Creatinine 0.61 - 1.24 mg/dL 0.86  5.78  4.69   BUN/Creat Ratio 6 - 22 (calc)  SEE NOTE:    Sodium 135 - 145 mmol/L 132  138  136   Potassium 3.5 - 5.1 mmol/L 4.2  4.7  4.5   Chloride 98 - 111 mmol/L 94  102  101   CO2 22 - 32 mmol/L 26  27  23    Calcium 8.9 - 10.3 mg/dL 9.1  9.3  9.6        Latest Ref Rng & Units 06/22/2022    8:23 AM 12/07/2021    2:31 PM 06/26/2021    8:04 AM  Hepatic Function  Total Protein 6.1 - 8.1 g/dL 6.6  6.6  6.9   AST 10 - 35  U/L 12  14  23    ALT 9 - 46 U/L 14  13  20    Total Bilirubin 0.2 - 1.2 mg/dL 0.4  0.4  0.6        Latest Ref Rng & Units 06/22/2022    8:23 AM 03/29/2022   10:16 AM 02/12/2022    9:55 AM  CBC  WBC 3.8 - 10.8 Thousand/uL 6.4  6.6  7.3   Hemoglobin 13.2 - 17.1 g/dL 16.1  09.6  04.5   Hematocrit 38.5 - 50.0 % 45.2  46.2  47.1   Platelets 140 - 400 Thousand/uL 264  257  247    Lab Results  Component Value Date   MCV 88.1 06/22/2022   MCV 88.8 03/29/2022   MCV 89.7 02/12/2022    BNP    Component Value Date/Time   PROBNP 36.0 06/04/2010 2206    Lipid Panel     Component Value Date/Time   CHOL 115 06/22/2022 0823   CHOL 155 11/21/2012 1025   TRIG 69 06/22/2022 0823   TRIG 81 11/21/2012 1025   HDL 56 06/22/2022 0823   HDL 45 11/21/2012 1025   CHOLHDL 2.1 06/22/2022 0823   VLDL 10 09/13/2016 0822   LDLCALC 44 06/22/2022 0823   LDLCALC 94 11/21/2012 1025     RADIOLOGY: No results found.  IMPRESSION:  1. CAD S/P percutaneous coronary angioplasty   2. Essential hypertension   3. RBBB   4. Aortic valve stenosis, etiology of cardiac valve disease unspecified   5. OSA (obstructive sleep apnea)   6. Cardiac pacemaker in situ   7. Hypertensive heart disease without heart failure   8. Type 2 diabetes mellitus with complication, without long-term current use of insulin (HCC)      ASSESSMENT AND PLAN: Lee [name] Is a 72 year old male who has CAD and is status post intervention to his proximal and mid LAD as well as his RCA.  A repeat cardiac catheterization by Dr. Herbie Baltimore did not demonstrate significant cardiac etiology to his chest pain. He was empirically started on nitrate therapy but this resulted in frequent migraine headaches.  He was hospitalized in April and again in May 2020 with syncope and was found to have multiple pauses ranging from 6,7,9 and 12 seconds and transient complete heart block with ventricular rate in the 20s.  He was off all AV nodal agents moving  forward.  However due to current symptomatic Mobitz 2 second-degree AV block he underwent insertion of a dual-chamber [facility] [name] permanent pacemaker by Lee [name] on May 01, 2019.  He has continued to be stable with reference to his cardiac rhythm.  He undergoes remote pacemaker checks with his most recent dilation showing battery and lead parameters.  He remains asymptomatic with reference to chest pain or shortness of breath.  His ECG today shows atrial sensing and ventricular pacing with rate 82.  He admits that he uses CPAP every night but sleep duration is suboptimal.  I stressed the importance of optimal sleep at 7 and 9 hours and that he should use CPAP for the nights entirety.  He has been alternating where he sleeps and has his daughter's CPAP machine in the living  room where he sleeps on a recliner and his CPAP machine in the bedroom.  Will also try to link his daughter's machine to our office.   His blood pressure today is stable on his regimen of amlodipine 10 mg, furosemide 40 mg, and telmisartan 40 mg.  He is  on rosuvastatin 40 mg for hyperlipidemia.  Most recent LDL cholesterol was 44 with total cholesterol 115 in January 2024.  He is followed by Dr. Tanya [name] for primary care.  I reviewed his most recent echo Doppler study from September 03, 2022 which  showed  normal systolic function.  He does have mild aortic valve stenosis and grade 1 diastolic dysfunction.  He will continue his current medical regimen and will have EP follow-up evaluation.  I have recommended he undergo a 1 year follow-up echo Doppler study in March/April 2025 and I will see him in follow-up at that time.    Lennette Bihari, MD, [facility]  01/21/2023 10:59 AM

## 2023-01-21 ENCOUNTER — Ambulatory Visit: Payer: Medicare Other | Attending: Cardiovascular Disease | Admitting: Cardiovascular Disease

## 2023-01-21 ENCOUNTER — Encounter: Payer: Self-pay | Admitting: Cardiovascular Disease

## 2023-01-21 DIAGNOSIS — I10 Essential (primary) hypertension: Secondary | ICD-10-CM

## 2023-01-21 DIAGNOSIS — Z950 Presence of cardiac pacemaker: Secondary | ICD-10-CM

## 2023-01-21 DIAGNOSIS — E118 Type 2 diabetes mellitus with unspecified complications: Secondary | ICD-10-CM

## 2023-01-21 DIAGNOSIS — Z9861 Coronary angioplasty status: Secondary | ICD-10-CM | POA: Diagnosis not present

## 2023-01-21 DIAGNOSIS — I2510 Atherosclerotic heart disease of native coronary artery without angina pectoris: Secondary | ICD-10-CM

## 2023-01-21 DIAGNOSIS — I350 Nonrheumatic aortic (valve) stenosis: Secondary | ICD-10-CM

## 2023-01-21 DIAGNOSIS — G4733 Obstructive sleep apnea (adult) (pediatric): Secondary | ICD-10-CM

## 2023-01-21 DIAGNOSIS — I4510 Unspecified right bundle-branch block: Secondary | ICD-10-CM

## 2023-01-21 DIAGNOSIS — I119 Hypertensive heart disease without heart failure: Secondary | ICD-10-CM

## 2023-01-21 NOTE — Patient Instructions (Addendum)
Medication Instructions:  *If you need a refill on your cardiac medications before your next appointment, please call your pharmacy*   Lab Work: If you have labs (blood work) drawn today and your tests are completely normal, you will receive your results only by: MyChart Message (if you have MyChart) OR A paper copy in the mail If you have any lab test that is abnormal or we need to change your treatment, we will call you to review the results.   Testing/Procedures: Your physician has requested that you have an echocardiogram. Echocardiography is a painless test that uses sound waves to create images of your heart. It provides your doctor with information about the size and shape of your heart and how well your heart's chambers and valves are working. This procedure takes approximately one hour. There are no restrictions for this procedure. Please do NOT wear cologne, aftershave, or lotions (deodorant is allowed).  Please arrive 15 minutes prior to your appointment time.    Follow-Up: At [facility], you and your health needs are our priority.  As part of our continuing mission to provide you with exceptional heart care, we have created designated Provider Care Teams.  These Care Teams include your primary Cardiologist (physician) and Advanced Practice Providers (APPs -  Physician Assistants and Nurse Practitioners) who all work together to provide you with the care you need, when you need it.  We recommend signing up for the patient portal called "MyChart".  Sign up information is provided on this After Visit Summary.  MyChart is used to connect with patients for Virtual Visits (Telemedicine).  Patients are able to view lab/test results, encounter notes, upcoming appointments, etc.  Non-urgent messages can be sent to your provider as well.   To learn more about what you can do with MyChart, go to [email or web address].    Your next appointment:   8 month(s) MAKE FOLLOW UP  APPOINTMENT AFTER THE ECHO HAS BEEN DONE FOR MARCH 2025.  A LETTER WILL BE MAILED TO YOU AS A REMINDER TO CALL THE OFFICE FOR YOUR FOLLOW UP APPOINTMENT FOR MARCH 2025.  Provider:   Nicki Guadalajara, MD

## 2023-01-22 ENCOUNTER — Other Ambulatory Visit: Payer: Self-pay | Admitting: Family Medicine

## 2023-01-26 ENCOUNTER — Other Ambulatory Visit: Payer: Self-pay | Admitting: Family Medicine

## 2023-01-28 ENCOUNTER — Other Ambulatory Visit (HOSPITAL_COMMUNITY): Payer: Self-pay

## 2023-01-28 ENCOUNTER — Other Ambulatory Visit: Payer: Self-pay | Admitting: Family Medicine

## 2023-01-28 ENCOUNTER — Other Ambulatory Visit: Payer: Self-pay

## 2023-01-28 DIAGNOSIS — E119 Type 2 diabetes mellitus without complications: Secondary | ICD-10-CM

## 2023-01-28 MED ORDER — EMPAGLIFLOZIN 25 MG PO TABS
25.0000 mg | ORAL_TABLET | Freq: Every day | ORAL | 3 refills | Status: DC
Start: 2023-01-28 — End: 2023-02-01

## 2023-01-28 MED ORDER — EMPAGLIFLOZIN 25 MG PO TABS
25.0000 mg | ORAL_TABLET | Freq: Every day | ORAL | 0 refills | Status: DC
Start: 2023-01-28 — End: 2023-01-28
  Filled 2023-01-28: qty 30, 30d supply, fill #0

## 2023-01-28 NOTE — Telephone Encounter (Signed)
Patient completely out of Jardiance. Requesting refill be sent in today.  Pharmacy confirmed as:   - [facility] [address] [place] [address] Phone: [phone]  Fax: [phone] DEA #: [id number]   Please advise patient at [phone]

## 2023-02-01 ENCOUNTER — Other Ambulatory Visit (HOSPITAL_COMMUNITY): Payer: Self-pay

## 2023-02-01 ENCOUNTER — Other Ambulatory Visit: Payer: Self-pay | Admitting: Family Medicine

## 2023-02-01 DIAGNOSIS — E119 Type 2 diabetes mellitus without complications: Secondary | ICD-10-CM

## 2023-02-01 MED ORDER — EMPAGLIFLOZIN 25 MG PO TABS
25.0000 mg | ORAL_TABLET | Freq: Every day | ORAL | 3 refills | Status: DC
Start: 2023-02-01 — End: 2024-01-31
  Filled 2023-02-01: qty 90, 90d supply, fill #0
  Filled 2023-04-28: qty 90, 90d supply, fill #1
  Filled 2023-07-26: qty 90, 90d supply, fill #2
  Filled 2023-10-25: qty 90, 90d supply, fill #3

## 2023-02-07 ENCOUNTER — Other Ambulatory Visit (HOSPITAL_COMMUNITY): Payer: Self-pay

## 2023-02-07 ENCOUNTER — Ambulatory Visit (INDEPENDENT_AMBULATORY_CARE_PROVIDER_SITE_OTHER): Payer: Medicare Other | Admitting: Family Medicine

## 2023-02-07 VITALS — BP 130/82 | HR 94 | Temp 97.6°F | Ht 68.0 in | Wt 196.4 lb

## 2023-02-07 DIAGNOSIS — I480 Paroxysmal atrial fibrillation: Secondary | ICD-10-CM | POA: Diagnosis not present

## 2023-02-07 DIAGNOSIS — E118 Type 2 diabetes mellitus with unspecified complications: Secondary | ICD-10-CM | POA: Diagnosis not present

## 2023-02-07 DIAGNOSIS — I2510 Atherosclerotic heart disease of native coronary artery without angina pectoris: Secondary | ICD-10-CM | POA: Diagnosis not present

## 2023-02-07 DIAGNOSIS — Z7985 Long-term (current) use of injectable non-insulin antidiabetic drugs: Secondary | ICD-10-CM

## 2023-02-07 DIAGNOSIS — I10 Essential (primary) hypertension: Secondary | ICD-10-CM | POA: Diagnosis not present

## 2023-02-07 MED ORDER — OXYCODONE-ACETAMINOPHEN 10-325 MG PO TABS
1.0000 | ORAL_TABLET | Freq: Three times a day (TID) | ORAL | 0 refills | Status: DC | PRN
  Filled 2023-02-07: qty 21, 7d supply, fill #0

## 2023-02-07 NOTE — Progress Notes (Signed)
Subjective:    Patient ID: Lee [name], male    DOB: 12-01-1950, 72 y.o.   MRN: [id number]  HPI Patient is a 72 year old Caucasian gentleman with a history of coronary artery disease as well as pacemaker placement due to AV block.  He also has a history of paroxysmal atrial fibrillation and DM2.   Wt Readings from Last 3 Encounters:  02/07/23 196 lb 6.4 oz (89.1 kg)  01/21/23 202 lb 9.6 oz (91.9 kg)  11/08/22 203 lb 9.6 oz (92.4 kg)  Patient continues to do well on Mounjaro.  He estimates that he is lost more than 60 pounds since starting the medication.  His weight is down another 6 pounds since August.  He denies any nausea or vomiting.  He denies any chest pain.  He denies any shortness of breath or dyspnea on exertion.  He does have mild aortic stenosis which accounts for his murmur.  He sees Dr. Allyson Sabal annually in a month this with echocardiogram.  His blood sugars are staying in the 90s.  He denies any neuropathy in his feet.  He denies any numbness in his feet.  Past Medical History:  Diagnosis Date   Allergic rhinitis    chronic sinusitis   Arthritis    osetoarthritis   CAD (coronary artery disease)    a. 2009 PCI/DES to mLAD; b. 05/2010 s/p DES RCA and LAD.; c. 05/2013 Cath: LAD mild ISR, LCX nl, RCA 40p, patent stents.   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   Cataract    has had lasik surg   COPD (chronic obstructive pulmonary disease) (HCC)    Depression    Diabetes mellitus type 2, controlled, without complications (HCC)    Diabetes mellitus without complication (HCC)    Phreesia 08/22/2020   Diverticulosis    DJD (degenerative joint disease)    Esophagitis 1991   grade 1   GERD (gastroesophageal reflux disease)    Hiatal hernia   Hearing loss    wears bilateral hearing aids   Heart murmur    Dr Tanya Nones dx per patient   Hemorrhoids    Hyperlipidemia    Hypertension    Hypertensive heart disease    Iron deficiency anemia    Migraines     Myocardial infarction [facility])    Paroxysmal atrial fibrillation (HCC)    a. s/p afib ablation 10/22/08 (J. Allred).   Presence of permanent cardiac pacemaker    PVC's (premature ventricular contractions)    Sleep apnea    Uses nightly.  Questionalble: RDI during the total sleep time 6h 28 mins was 3.55/hr during REM sleep was at 5.71/hr. Supine AHI was 5.93/hr.   Special screening for malignant neoplasms, colon 10/01/2022   Syncope 08/2018   Past Surgical History:  Procedure Laterality Date   ABDOMINAL HYSTERECTOMY     CARDIAC CATHETERIZATION  05/31/2010   The proximal LAD was then predilated with 2.0 x 12 trek. this was then stented with a 2.5 x 16 promus Element drug-eluting stent at 14 atmosphere and prostdilated with 2.75 x 12 [place] trek at 16 atmospheres(2.8 mm) resulting the reduction of 80% proximal LAD stenosis to 0% residual with excellent flow.   CARDIAC CATHETERIZATION  06/05/2010   Successful percutaneous coronary intervention to the right coronary artery with percutaneous transluminal coronary angioplasty/stenting and insertion 3.0 x 16 mm Promus DES post dilated to 3.35 mm with stenosis being reduced to 0%   CARDIAC CATHETERIZATION  02/29/2008  Post dilatation was performed using a 2.75 x 9 [place] sprinter, 10 atmospheres for 40 seconds and then 9 atmosphere for 35 sec. This resulted in the 80% area of narrowing pre-intervention, now appearing to normal. There was no edvidence of the dissection or thrombus and there was TIMI III flow pre and post.   CARDIAC CATHETERIZATION  02/28/2006   COLONOSCOPY WITH PROPOFOL N/A 10/01/2022   Procedure: COLONOSCOPY WITH PROPOFOL;  Surgeon: Dolores Frame, MD;  [facility]: [facility];  Service: Gastroenterology;  Laterality: N/A;  8:00am;asa 3   CORONARY ANGIOPLASTY WITH STENT PLACEMENT     3 stents/ 4 caths   DISTAL BICEPS TENDON REPAIR Left 02/12/2022   Procedure: LEFT DISTAL BICEPS TENDON REPAIR;  Surgeon: Tarry Kos, MD;  Location: [facility]  OR;  Service: Orthopedics;  Laterality: Left;   DISTAL BICEPS TENDON REPAIR Left 03/03/2022   Procedure: LEFT DISTAL BICEPS TENDON REPAIR;  Surgeon: Tarry Kos, MD;  Location: [facility] OR;  Service: Orthopedics;  Laterality: Left;   EYE SURGERY     FINGER ARTHROPLASTY Right 09/02/2021   Procedure: RIGHT THUMB CARPOMETACARPAL ARTHROPLASTY;  Surgeon: Tarry Kos, MD;  Location: [facility];  Service: Orthopedics;  Laterality: Right;  block in preop   KNEE ARTHROSCOPY     right   LASIK     LEFT HEART CATHETERIZATION WITH CORONARY ANGIOGRAM N/A 06/29/2013   Procedure: LEFT HEART CATHETERIZATION WITH CORONARY ANGIOGRAM;  Surgeon: Marykay Lex, MD;  Location: [facility] CATH LAB;  Service: Cardiovascular;  Laterality: N/A;   NASAL SINUS SURGERY  06/01/1999   NECK SURGERY     Cervical fusion   PACEMAKER IMPLANT N/A 05/01/2019   Procedure: PACEMAKER IMPLANT;  Surgeon: Hillis Range, MD;  Location: [facility] INVASIVE CV LAB;  Service: Cardiovascular;  Laterality: N/A;   RADIOFREQUENCY ABLATION  09/28/2008   atrial fibrillation   ROTATOR CUFF REPAIR     left   SHOULDER OPEN ROTATOR CUFF REPAIR     right   SPINE SURGERY     WRIST ARTHROCENTESIS     left   Current Outpatient Medications on File Prior to Visit  Medication Sig Dispense Refill   Accu-Chek FastClix Lancets MISC Check blood sugar 2 (two) times daily as directed 102 each 5   acetaminophen (TYLENOL) 325 MG tablet Take 1-2 tablets (325-650 mg total) by mouth every 4 (four) hours as needed for mild pain.     amLODipine (NORVASC) 10 MG tablet Take 1 tablet (10 mg total) by mouth daily. 90 tablet 1   aspirin EC 81 MG tablet Take 81 mg by mouth at bedtime.     Atogepant (QULIPTA) 30 MG TABS Take 1 tablet (30 mg total) by mouth daily. 30 tablet 11   Blood Glucose Monitoring Suppl (ACCU-CHEK AVIVA PLUS) w/Device KIT Check BS bid E11.9 1 kit 1   cholecalciferol (VITAMIN D) 1000 UNITS tablet Take 1,000 Units by mouth at bedtime.      citalopram  (CELEXA) 40 MG tablet Take 1 tablet (40 mg total) by mouth daily. 90 tablet 1   Coenzyme Q10 (CO Q 10) 100 MG CAPS Take 100 mg by mouth daily with breakfast.      doxycycline (VIBRAMYCIN) 100 MG capsule Take 1 capsule (100 mg total) by mouth daily with food until clear then as needed for flare (Patient not taking: Reported on 01/21/2023) 30 capsule 2   empagliflozin (JARDIANCE) 25 MG TABS tablet Take 1 tablet (25 mg total) by mouth daily before breakfast. 90  tablet 3   fexofenadine (ALLEGRA) 180 MG tablet Take 180 mg by mouth daily with breakfast.      fluticasone (FLONASE) 50 MCG/ACT nasal spray INSTILL 2 SPRAYS IN THE  NOSE AT BEDTIME 48 g 3   furosemide (LASIX) 40 MG tablet TAKE 1 TABLET BY MOUTH DAILY 90 tablet 3   glucose blood (ACCU-CHEK GUIDE) test strip Use to check blood sugar levels twice a day. 200 strip 2   hydrOXYzine (VISTARIL) 25 MG capsule Take 1 capsule (25 mg total) by mouth at bedtime as needed. 30 capsule 1   Lancets Misc. (ACCU-CHEK FASTCLIX LANCET) KIT USE AS DIRECTED TO CHECK BLOOD SUGAR TWICE DAILY 1 kit 1   metroNIDAZOLE (METROCREAM) 0.75 % cream Apply topically 2 (two) times daily to forehead, nose and cheeks 45 g 3   Multiple Vitamin (MULTIVITAMIN WITH MINERALS) TABS tablet Take 1 tablet by mouth daily with breakfast.     nitroGLYCERIN (NITROSTAT) 0.4 MG SL tablet PLACE ONE TABLET UNDER THE TONGUE EVERY 5 MINUTES AS NEEDED FOR CHEST PAIN (Patient not taking: Reported on 01/21/2023) 25 tablet 3   oxyCODONE-acetaminophen (PERCOCET) 10-325 MG tablet Take 1 tablet by mouth every 8 (eight) hours as needed for pain. 21 tablet 0   pantoprazole (PROTONIX) 40 MG tablet TAKE 1 TABLET BY MOUTH DAILY 90 tablet 3   Polyvinyl Alcohol-Povidone (REFRESH OP) Place 1 drop into both eyes at bedtime as needed (dry eyes).     potassium chloride (MICRO-K) 10 MEQ CR capsule TAKE 1 CAPSULE BY MOUTH DAILY 90 capsule 3   Rimegepant Sulfate 75 MG TBDP Take 75 mg by mouth daily as needed (Migraine). 16  tablet 11   rosuvastatin (CRESTOR) 40 MG tablet TAKE 1 TABLET BY MOUTH DAILY 90 tablet 3   telmisartan (MICARDIS) 40 MG tablet TAKE 1 TABLET BY MOUTH DAILY 90 tablet 3   tirzepatide (MOUNJARO) 10 MG/0.5ML Pen Inject 10 mg into the skin once a week. 6 mL 6   tirzepatide (MOUNJARO) 12.5 MG/0.5ML Pen Inject 12.5 mg into the skin once a week. (Patient not taking: Reported on 01/21/2023) 6 mL 2   tirzepatide (MOUNJARO) 7.5 MG/0.5ML Pen Inject 7.5 mg into the skin once a week. (Patient not taking: Reported on 01/21/2023) 6 mL 1   No current facility-administered medications on file prior to visit.   Allergies  Allergen Reactions   Ibuprofen Swelling    Whole body swelled   Other Shortness Of Breath    BETA BLOCKERS   Topamax [Topiramate] Other (See Comments)    Pt states it made his tongue feel "scalded" and he couldn't eat    Toprol Xl [Metoprolol Succinate] Other (See Comments)    Personality change   Metoprolol-Hctz Er Other (See Comments)    Indigestion,mouth raw   Social History   Socioeconomic History   Marital status: Married    Spouse name: Dois Davenport   Number of children: 2   Years of education: Not on file   Highest education level: 12th grade  Occupational History   Occupation: retired  Tobacco Use   Smoking status: Former    Current packs/day: 0.00    Average packs/day: 2.0 packs/day for 25.0 years (50.0 ttl pk-yrs)    Types: Cigarettes, Cigars    Start date: 04/23/1963    Quit date: 04/22/1988    Years since quitting: 34.8    Passive exposure: Past   Smokeless tobacco: Never  Vaping Use   Vaping status: Never Used  Substance and Sexual Activity  Alcohol use: Not Currently   Drug use: No   Sexual activity: Not Currently    Birth control/protection: Surgical    Comment: vas  Other Topics Concern   Not on file  Social History Narrative   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   Currently works part time for [employer].   2 daughters.     Married x 43 years 09/2021.   Caffiene:  1 cup daily, 2 diet drinks daily. (Caffiene free most time).    Social Determinants of Health   Financial Resource Strain: Low Risk  (02/03/2023)   Overall Financial Resource Strain (CARDIA)    Difficulty of Paying Living Expenses: Not hard at all  Food Insecurity: No Food Insecurity (02/03/2023)   Hunger Vital Sign    Worried About Running Out of Food in the Last Year: Never true    Ran Out of Food in the Last Year: Never true  Transportation Needs: No Transportation Needs (02/03/2023)   PRAPARE - Administrator, Civil Service (Medical): No    Lack of Transportation (Non-Medical): No  Physical Activity: Insufficiently Active (02/03/2023)   Exercise Vital Sign    Days of Exercise per Week: 3 days    Minutes of Exercise per Session: 30 min  Stress: No Stress Concern Present (02/03/2023)   [employer] of Occupational Health - Occupational Stress Questionnaire    Feeling of Stress : Not at all  Social Connections: Socially Integrated (02/03/2023)   Social Connection and Isolation Panel [NHANES]    Frequency of Communication with Friends and Family: More than three times a week    Frequency of Social Gatherings with Friends and Family: Three times a week    Attends Religious Services: More than 4 times per year    Active Member of Clubs or Organizations: No    Attends Banker Meetings: More than 4 times per year    Marital Status: Married  Catering manager Violence: Not At Risk (09/02/2022)   Humiliation, Afraid, Rape, and Kick questionnaire    Fear of Current or Ex-Partner: No    Emotionally Abused: No    Physically Abused: No    Sexually Abused: No   Family History  Problem Relation Age of Onset   Colon cancer Mother        mets from uterine   Uterine cancer Mother    Heart disease Father        and sister   Diabetes Father    Hearing loss Father    Stroke Father    Heart disease Sister    Lung cancer Sister     Stroke Brother    Migraines Neg Hx      Review of Systems  All other systems reviewed and are negative.      Objective:   Physical Exam Vitals reviewed.  Constitutional:      General: He is not in acute distress.    Appearance: He is well-developed. He is not diaphoretic.  HENT:     Head: Normocephalic and atraumatic.     Right Ear: External ear normal.     Left Ear: External ear normal.     Nose: Nose normal.     Mouth/Throat:     Pharynx: No oropharyngeal exudate.  Eyes:     General: No scleral icterus.       Right eye: No discharge.        Left eye: No discharge.  Conjunctiva/sclera: Conjunctivae normal.     Pupils: Pupils are equal, round, and reactive to light.  Neck:     Thyroid: No thyromegaly.     Vascular: No JVD.     Trachea: No tracheal deviation.  Cardiovascular:     Rate and Rhythm: Normal rate and regular rhythm.     Heart sounds: Murmur heard.     No friction rub. No gallop.  Pulmonary:     Effort: Pulmonary effort is normal. No respiratory distress.     Breath sounds: Normal breath sounds. No stridor. No wheezing or rales.  Chest:     Chest wall: No tenderness.  Abdominal:     General: Bowel sounds are normal. There is no distension.     Palpations: Abdomen is soft. There is no mass.     Tenderness: There is no abdominal tenderness. There is no guarding or rebound.     Hernia: No hernia is present.  Musculoskeletal:        General: No tenderness. Normal range of motion.     Cervical back: Normal range of motion and neck supple.  Lymphadenopathy:     Cervical: No cervical adenopathy.  Skin:    General: Skin is warm.     Capillary Refill: Capillary refill takes less than 2 seconds.     Coloration: Skin is not pale.     Findings: No erythema or rash.  Neurological:     Mental Status: He is alert and oriented to person, place, and time.     Cranial Nerves: No cranial nerve deficit.     Sensory: No sensory deficit.     Motor: No abnormal  muscle tone.     Coordination: Coordination normal.     Deep Tendon Reflexes: Reflexes normal.  Psychiatric:        Behavior: Behavior normal.        Thought Content: Thought content normal.        Judgment: Judgment normal.           Assessment & Plan:  Type 2 diabetes mellitus with complication, without long-term current use of insulin (HCC) - Plan: Hemoglobin A1c, CBC with Differential/Platelet, Lipid panel, COMPLETE METABOLIC PANEL WITH GFR, Protein / Creatinine Ratio, Urine  Coronary artery disease involving native coronary artery of native heart without angina pectoris  Essential hypertension  Paroxysmal atrial fibrillation (HCC) PAD pressure is outstanding.  His reported blood sugars are excellent.  I will check an A1c.  Goal A1c is less than 6.5.  I will check a fasting lipid panel.  Goal LDL cholesterol is less than 70.  Monitor urine protein creatinine ratio.  We discussed vaccinations.  He is due for a flu shot and a COVID vaccination.  I did refill his oxycodone which she takes occasionally for migraines.  21 pills lasting more than 4 months.

## 2023-02-08 LAB — CBC WITH DIFFERENTIAL/PLATELET
Absolute Monocytes: 539 {cells}/uL (ref 200–950)
Basophils Absolute: 49 {cells}/uL (ref 0–200)
Basophils Relative: 0.5 %
Eosinophils Absolute: 137 {cells}/uL (ref 15–500)
Eosinophils Relative: 1.4 %
HCT: 46.4 % (ref 38.5–50.0)
Hemoglobin: 15.2 g/dL (ref 13.2–17.1)
Lymphs Abs: 1617 {cells}/uL (ref 850–3900)
MCH: 29.1 pg (ref 27.0–33.0)
MCHC: 32.8 g/dL (ref 32.0–36.0)
MCV: 88.7 fL (ref 80.0–100.0)
MPV: 9.1 fL (ref 7.5–12.5)
Monocytes Relative: 5.5 %
Neutro Abs: 7458 {cells}/uL (ref 1500–7800)
Neutrophils Relative %: 76.1 %
Platelets: 289 10*3/uL (ref 140–400)
RBC: 5.23 10*6/uL (ref 4.20–5.80)
RDW: 12 % (ref 11.0–15.0)
Total Lymphocyte: 16.5 %
WBC: 9.8 10*3/uL (ref 3.8–10.8)

## 2023-02-08 LAB — COMPLETE METABOLIC PANEL WITH GFR
AG Ratio: 1.9 (calc) (ref 1.0–2.5)
ALT: 14 U/L (ref 9–46)
AST: 15 U/L (ref 10–35)
Albumin: 4.6 g/dL (ref 3.6–5.1)
Alkaline phosphatase (APISO): 70 U/L (ref 35–144)
BUN: 14 mg/dL (ref 7–25)
CO2: 24 mmol/L (ref 20–32)
Calcium: 9.7 mg/dL (ref 8.6–10.3)
Chloride: 99 mmol/L (ref 98–110)
Creat: 0.8 mg/dL (ref 0.70–1.28)
Globulin: 2.4 g/dL (ref 1.9–3.7)
Glucose, Bld: 173 mg/dL — ABNORMAL HIGH (ref 65–99)
Potassium: 4.3 mmol/L (ref 3.5–5.3)
Sodium: 135 mmol/L (ref 135–146)
Total Bilirubin: 0.5 mg/dL (ref 0.2–1.2)
Total Protein: 7 g/dL (ref 6.1–8.1)
eGFR: 93 mL/min/{1.73_m2} (ref >=60)

## 2023-02-08 LAB — PROTEIN / CREATININE RATIO, URINE
Creatinine, Urine: 20 mg/dL (ref 20–320)
Total Protein, Urine: <4 mg/dL — ABNORMAL LOW (ref 5–25)

## 2023-02-08 LAB — HEMOGLOBIN A1C
Hgb A1c MFr Bld: 5.9 %{Hb} — ABNORMAL HIGH (ref <5.7)
Mean Plasma Glucose: 123 mg/dL
eAG (mmol/L): 6.8 mmol/L

## 2023-02-08 LAB — LIPID PANEL
Cholesterol: 123 mg/dL (ref <200)
HDL: 55 mg/dL (ref >=40)
LDL Cholesterol (Calc): 51 mg/dL
Non-HDL Cholesterol (Calc): 68 mg/dL (ref <130)
Total CHOL/HDL Ratio: 2.2 (calc) (ref <5.0)
Triglycerides: 88 mg/dL (ref <150)

## 2023-02-21 ENCOUNTER — Other Ambulatory Visit: Payer: Self-pay

## 2023-02-21 ENCOUNTER — Other Ambulatory Visit: Payer: Self-pay | Admitting: Family Medicine

## 2023-02-22 ENCOUNTER — Other Ambulatory Visit (HOSPITAL_COMMUNITY): Payer: Self-pay

## 2023-02-22 MED ORDER — CITALOPRAM HYDROBROMIDE 40 MG PO TABS
40.0000 mg | ORAL_TABLET | Freq: Every day | ORAL | 1 refills | Status: DC
  Filled 2023-02-22: qty 90, 90d supply, fill #0
  Filled 2023-05-20: qty 90, 90d supply, fill #1

## 2023-02-22 NOTE — Telephone Encounter (Signed)
Requested Prescriptions  Pending Prescriptions Disp Refills   citalopram (CELEXA) 40 MG tablet 90 tablet 1    Sig: Take 1 tablet (40 mg total) by mouth daily.     Psychiatry:  Antidepressants - SSRI Failed - 02/21/2023  7:05 AM      Failed - Valid encounter within last 6 months    Recent Outpatient Visits           1 year ago Coronary artery disease involving native coronary artery of native heart without angina pectoris   [facility] Medicine Donita Brooks, MD   1 year ago Type 2 diabetes mellitus with complication, without long-term current use of insulin (HCC)   [facility] Medicine Pickard, Priscille Heidelberg, MD   2 years ago Wrist pain, chronic, right   [facility] Medicine Pickard, Priscille Heidelberg, MD   2 years ago General medical exam   [facility] Medicine Donita Brooks, MD   2 years ago Type 2 diabetes mellitus with complication, without long-term current use of insulin (HCC)   [facility] Medicine Pickard, Priscille Heidelberg, MD       Future Appointments             In 4 months Pickard, Priscille Heidelberg, MD [facility]            Passed - Completed PHQ-2 or PHQ-9 in the last 360 days

## 2023-02-24 LAB — HM DIABETES EYE EXAM

## 2023-02-25 ENCOUNTER — Ambulatory Visit: Payer: Medicare Other

## 2023-02-25 DIAGNOSIS — I442 Atrioventricular block, complete: Secondary | ICD-10-CM

## 2023-02-26 LAB — CUP PACEART REMOTE DEVICE CHECK
Battery Remaining Longevity: 60 mo
Battery Remaining Percentage: 61 %
Battery Voltage: 2.99 V
Brady Statistic AP VP Percent: 1 %
Brady Statistic AP VS Percent: 1 %
Brady Statistic AS VP Percent: 99 %
Brady Statistic AS VS Percent: 1 %
Brady Statistic RA Percent Paced: 1 %
Brady Statistic RV Percent Paced: 99 %
Date Time Interrogation Session: 20240927054125
Implantable Lead Connection Status: 753985
Implantable Lead Connection Status: 753985
Implantable Lead Implant Date: 20201201
Implantable Lead Implant Date: 20201201
Implantable Lead Location: 753859
Implantable Lead Location: 753860
Implantable Pulse Generator Implant Date: 20201201
Lead Channel Impedance Value: 460 Ohm
Lead Channel Impedance Value: 460 Ohm
Lead Channel Pacing Threshold Amplitude: 0.75 V
Lead Channel Pacing Threshold Amplitude: 1 V
Lead Channel Pacing Threshold Pulse Width: 0.5 ms
Lead Channel Pacing Threshold Pulse Width: 0.5 ms
Lead Channel Sensing Intrinsic Amplitude: 2.4 mV
Lead Channel Sensing Intrinsic Amplitude: 3.1 mV
Lead Channel Setting Pacing Amplitude: 2 V
Lead Channel Setting Pacing Amplitude: 2.5 V
Lead Channel Setting Pacing Pulse Width: 0.5 ms
Lead Channel Setting Sensing Sensitivity: 2 mV
Pulse Gen Model: 2272
Pulse Gen Serial Number: 9182765

## 2023-03-03 NOTE — Progress Notes (Signed)
Remote pacemaker transmission.   

## 2023-03-20 ENCOUNTER — Other Ambulatory Visit: Payer: Self-pay | Admitting: Family Medicine

## 2023-03-21 ENCOUNTER — Encounter: Payer: Self-pay | Admitting: Family Medicine

## 2023-03-21 ENCOUNTER — Other Ambulatory Visit: Payer: Self-pay

## 2023-03-21 ENCOUNTER — Other Ambulatory Visit (HOSPITAL_COMMUNITY): Payer: Self-pay

## 2023-03-21 MED ORDER — AMLODIPINE BESYLATE 10 MG PO TABS
10.0000 mg | ORAL_TABLET | Freq: Every day | ORAL | 1 refills | Status: DC
  Filled 2023-03-21: qty 90, 90d supply, fill #0
  Filled 2023-06-16: qty 90, 90d supply, fill #1

## 2023-03-21 NOTE — Telephone Encounter (Signed)
Requested Prescriptions  Pending Prescriptions Disp Refills   amLODipine (NORVASC) 10 MG tablet 90 tablet 1    Sig: Take 1 tablet (10 mg total) by mouth daily.     Cardiovascular: Calcium Channel Blockers 2 Failed - 03/20/2023 10:05 AM      Failed - Valid encounter within last 6 months    Recent Outpatient Visits           1 year ago Coronary artery disease involving native coronary artery of native heart without angina pectoris   [facility] Medicine Donita Brooks, MD   1 year ago Type 2 diabetes mellitus with complication, without long-term current use of insulin (HCC)   [facility] Medicine Pickard, Priscille Heidelberg, MD   2 years ago Wrist pain, chronic, right   [facility] Pickard, Priscille Heidelberg, MD   2 years ago General medical exam   [facility] Medicine Donita Brooks, MD   2 years ago Type 2 diabetes mellitus with complication, without long-term current use of insulin (HCC)   [facility] Pickard, Priscille Heidelberg, MD       Future Appointments             In 3 months Pickard, Priscille Heidelberg, MD [facility], [facility]            Passed - Last BP in normal range    BP Readings from Last 1 Encounters:  02/07/23 130/82         Passed - Last Heart Rate in normal range    Pulse Readings from Last 1 Encounters:  02/07/23 94

## 2023-03-22 ENCOUNTER — Other Ambulatory Visit (HOSPITAL_COMMUNITY): Payer: Self-pay

## 2023-03-22 ENCOUNTER — Other Ambulatory Visit: Payer: Self-pay

## 2023-05-01 ENCOUNTER — Other Ambulatory Visit: Payer: Self-pay | Admitting: Cardiovascular Disease

## 2023-05-20 ENCOUNTER — Encounter: Payer: Self-pay | Admitting: Family Medicine

## 2023-05-20 ENCOUNTER — Other Ambulatory Visit (HOSPITAL_COMMUNITY): Payer: Self-pay

## 2023-05-20 MED ORDER — OXYCODONE-ACETAMINOPHEN 10-325 MG PO TABS
1.0000 | ORAL_TABLET | Freq: Three times a day (TID) | ORAL | 0 refills | Status: DC | PRN
  Filled 2023-05-20: qty 21, 7d supply, fill #0

## 2023-05-26 ENCOUNTER — Other Ambulatory Visit (HOSPITAL_COMMUNITY): Payer: Self-pay

## 2023-05-27 ENCOUNTER — Ambulatory Visit (INDEPENDENT_AMBULATORY_CARE_PROVIDER_SITE_OTHER): Payer: Medicare Other

## 2023-05-27 DIAGNOSIS — I442 Atrioventricular block, complete: Secondary | ICD-10-CM | POA: Diagnosis not present

## 2023-05-28 LAB — CUP PACEART REMOTE DEVICE CHECK
Battery Remaining Longevity: 58 mo
Battery Remaining Percentage: 58 %
Battery Voltage: 2.98 V
Brady Statistic AP VP Percent: 1 %
Brady Statistic AP VS Percent: 1 %
Brady Statistic AS VP Percent: 99 %
Brady Statistic AS VS Percent: 1 %
Brady Statistic RA Percent Paced: 1 %
Brady Statistic RV Percent Paced: 99 %
Date Time Interrogation Session: 20241227032221
Implantable Lead Connection Status: 753985
Implantable Lead Connection Status: 753985
Implantable Lead Implant Date: 20201201
Implantable Lead Implant Date: 20201201
Implantable Lead Location: 753859
Implantable Lead Location: 753860
Implantable Pulse Generator Implant Date: 20201201
Lead Channel Impedance Value: 440 Ohm
Lead Channel Impedance Value: 460 Ohm
Lead Channel Pacing Threshold Amplitude: 0.75 V
Lead Channel Pacing Threshold Amplitude: 1 V
Lead Channel Pacing Threshold Pulse Width: 0.5 ms
Lead Channel Pacing Threshold Pulse Width: 0.5 ms
Lead Channel Sensing Intrinsic Amplitude: 2.8 mV
Lead Channel Sensing Intrinsic Amplitude: 3.2 mV
Lead Channel Setting Pacing Amplitude: 2 V
Lead Channel Setting Pacing Amplitude: 2.5 V
Lead Channel Setting Pacing Pulse Width: 0.5 ms
Lead Channel Setting Sensing Sensitivity: 2 mV
Pulse Gen Model: 2272
Pulse Gen Serial Number: 9182765

## 2023-06-16 ENCOUNTER — Other Ambulatory Visit: Payer: Self-pay | Admitting: Family Medicine

## 2023-06-16 ENCOUNTER — Other Ambulatory Visit: Payer: Self-pay

## 2023-06-16 DIAGNOSIS — E118 Type 2 diabetes mellitus with unspecified complications: Secondary | ICD-10-CM

## 2023-06-16 NOTE — Telephone Encounter (Signed)
Requested medication (s) are due for refill today: yes  Requested medication (s) are on the active medication list: yes  Last refill:  09/14/22  Future visit scheduled: yes  Notes to clinic:   Medication not assigned to a protocol, review manually.      Requested Prescriptions  Pending Prescriptions Disp Refills   Accu-Chek FastClix Lancets MISC 102 each 5    Sig: Check blood sugar 2 (two) times daily as directed     Endocrinology: Diabetes - Testing Supplies Failed - 06/16/2023 12:22 PM      Failed - Valid encounter within last 12 months    Recent Outpatient Visits           1 year ago Coronary artery disease involving native coronary artery of native heart without angina pectoris   [facility] Medicine Donita Brooks, MD   1 year ago Type 2 diabetes mellitus with complication, without long-term current use of insulin (HCC)   [facility] Pickard, Priscille Heidelberg, MD   2 years ago Wrist pain, chronic, right   [facility] Family Medicine Pickard, Priscille Heidelberg, MD   2 years ago General medical exam   [facility] Medicine Donita Brooks, MD   3 years ago Type 2 diabetes mellitus with complication, without long-term current use of insulin (HCC)   [facility] Pickard, Priscille Heidelberg, MD       Future Appointments             In 1 week Pickard, Priscille Heidelberg, MD [place] [facility]             tirzepatide [facility]) 12.5 MG/0.5ML Pen 6 mL 2    Sig: Inject 12.5 mg into the skin once a week.     Off-Protocol Failed - 06/16/2023 12:22 PM      Failed - Medication not assigned to a protocol, review manually.      Failed - Valid encounter within last 12 months    Recent Outpatient Visits           1 year ago Coronary artery disease involving native coronary artery of native heart without angina pectoris   [facility] Medicine Donita Brooks, MD   1 year ago Type 2 diabetes mellitus with complication,  without long-term current use of insulin (HCC)   [facility] Medicine Pickard, Priscille Heidelberg, MD   2 years ago Wrist pain, chronic, right   [facility] Medicine Pickard, Priscille Heidelberg, MD   2 years ago General medical exam   [facility] Medicine Donita Brooks, MD   3 years ago Type 2 diabetes mellitus with complication, without long-term current use of insulin (HCC)   [facility] Medicine Pickard, Priscille Heidelberg, MD       Future Appointments             In 1 week Pickard, Priscille Heidelberg, MD [facility]

## 2023-06-19 ENCOUNTER — Other Ambulatory Visit: Payer: Self-pay

## 2023-06-20 ENCOUNTER — Ambulatory Visit: Payer: Medicare Other | Admitting: Adult Health

## 2023-06-22 ENCOUNTER — Ambulatory Visit (INDEPENDENT_AMBULATORY_CARE_PROVIDER_SITE_OTHER): Payer: Medicare Other | Admitting: Adult Health

## 2023-06-22 ENCOUNTER — Other Ambulatory Visit: Payer: Self-pay

## 2023-06-22 ENCOUNTER — Encounter: Payer: Self-pay | Admitting: Adult Health

## 2023-06-22 ENCOUNTER — Encounter: Payer: Self-pay | Admitting: Family Medicine

## 2023-06-22 ENCOUNTER — Other Ambulatory Visit: Payer: Medicare Other

## 2023-06-22 ENCOUNTER — Other Ambulatory Visit (HOSPITAL_COMMUNITY): Payer: Self-pay

## 2023-06-22 DIAGNOSIS — Z125 Encounter for screening for malignant neoplasm of prostate: Secondary | ICD-10-CM

## 2023-06-22 DIAGNOSIS — G43009 Migraine without aura, not intractable, without status migrainosus: Secondary | ICD-10-CM

## 2023-06-22 DIAGNOSIS — E119 Type 2 diabetes mellitus without complications: Secondary | ICD-10-CM

## 2023-06-22 DIAGNOSIS — I10 Essential (primary) hypertension: Secondary | ICD-10-CM

## 2023-06-22 DIAGNOSIS — E785 Hyperlipidemia, unspecified: Secondary | ICD-10-CM

## 2023-06-22 DIAGNOSIS — E118 Type 2 diabetes mellitus with unspecified complications: Secondary | ICD-10-CM

## 2023-06-22 MED ORDER — QULIPTA 60 MG PO TABS
60.0000 mg | ORAL_TABLET | Freq: Every day | ORAL | 3 refills | Status: DC
  Filled 2023-06-22: qty 90, 90d supply, fill #0
  Filled 2023-09-21: qty 90, 90d supply, fill #1
  Filled 2023-12-15: qty 90, 90d supply, fill #2
  Filled 2024-03-18: qty 90, 90d supply, fill #3

## 2023-06-22 MED ORDER — RIMEGEPANT SULFATE 75 MG PO TBDP
1.0000 | ORAL_TABLET | Freq: Every day | ORAL | 11 refills | Status: AC | PRN
  Filled 2023-06-22: qty 16, 60d supply, fill #0
  Filled 2024-02-19: qty 16, 60d supply, fill #1
  Filled 2024-04-11: qty 16, 60d supply, fill #2
  Filled 2024-05-29: qty 16, 60d supply, fill #3

## 2023-06-22 MED ORDER — TIRZEPATIDE 12.5 MG/0.5ML ~~LOC~~ SOAJ
12.5000 mg | SUBCUTANEOUS | 2 refills | Status: DC
  Filled 2023-06-22: qty 6, 84d supply, fill #0

## 2023-06-22 MED ORDER — GLUCOSE BLOOD VI STRP
ORAL_STRIP | Freq: Two times a day (BID) | 12 refills | Status: AC
  Filled 2023-06-22: qty 100, 50d supply, fill #0
  Filled 2023-09-12: qty 100, 50d supply, fill #1
  Filled 2023-11-10: qty 100, 50d supply, fill #2
  Filled 2024-02-19: qty 100, 50d supply, fill #3
  Filled 2024-04-30: qty 100, 50d supply, fill #4
  Filled 2024-06-17: qty 100, 50d supply, fill #5

## 2023-06-22 NOTE — Patient Instructions (Addendum)
Your Plan:  Increase Qulipta 60 mg daily  Take nurtec 75 mg tablet at the onset of migraine. Only 1 tab in 24 hours. Stop taking any over the counter medication such as goody powder, tylenol, aleve, ibuprofen.  If your symptoms worsen or you develop new symptoms please let us know.   Thank you for coming to see [place] at [facility]. I hope we have been able to provide you high quality care today.  You may receive a patient satisfaction survey over the next few weeks. We would appreciate your feedback and comments so that we may continue to improve ourselves and the health of our patients.

## 2023-06-22 NOTE — Progress Notes (Signed)
PATIENT: Lee [name] DOB: 28-Nov-1950  REASON FOR VISIT: follow up HISTORY FROM: patient PRIMARY NEUROLOGIST: Dr. Lucia Gaskins   Chief Complaint  Patient presents with   Follow-up    Patient in room #5 with his wife. Patient states he still having headaches five days out the week.     HISTORY OF PRESENT ILLNESS: Today 06/22/23:  Lee [name] is a 73 y.o. male with a history of migraine headaches. Returns today for follow-up. Reports that he is having almost daily headaches atleast 5 out of 7 days. Headaches are frontal and occipital area. No phonophobia but does photophobia. No aura.  Occasional nausea but no vomiting.  No sensory changes with his headaches.  He does report that he takes [place] powder almost daily.  May also take Tylenol as well.  He does have Nurtec but does not use it regularly.  He does take Qulipta 30 mg daily returns today for an evaluation.     11/08/22: Lee [name] is a 73 y.o. male who has been followed in this office for MIgraine headaches. Returns today for follow-up. Reports that Emgality has reduced the migraines.  He reports that he has approximately 6 migraines a month.  But can have 4 dull headaches a week.  Some weeks he may not have any dull headaches.  He does tend to take over-the-counter medication Tylenol ibuprofen daily or at least several times a week.  He has Nurtec but reports that it does not resolve the headache fairly quickly.  He has tried several medications in the past.  HISTORY Addendum: Patient is doing excellent on Emgality.  He is only having 4 migraines a month and less than 10 total headache days a month.  We will refill his Nurtec for acute management.4 migraine days a month. < 10 total headache days a month. Failed mitrex, Nurtec, maxalt   HPI:  Lee [name] is a 73 y.o. male here as requested by Donita Brooks, MD for migraines.  Past medical history second-degree Mobitz 2 AV block, heart block complete,  symptomatic bradycardia, vasovagal syncope, depression, hypertensive heart disease, hypertension, paroxysmal A-fib, hyperlipidemia, morbid obesity, obstructive sleep apnea, CAD, COPD, depression, DM2, DJD, heart disease, MI, pacemaker, Afib s/p ablation, crdiac caths s/p stents, cervical fusion.  Reviewed notes from [facility], he has tried and failed multiple medications, being treated with Aimovig and Nurtec, using CPAP, they recommended weight loss and he has lost 40 pounds.   Here with wife who provides much information. Migraines for 10+ years, can start unilaterally but often start in the back of the head, pulsating/pounding/throbbing, photophobia/phonophobia, a quiet dark room helps, weather is a trigger, nausea, no vomiting, can last up to a week. He has tried a multitude of medications, Aimovig works the best, no significant side effects, emgality worked better, only 4 migraine days a month and but 15 total headache days a month. Using cpap religiously. Baseline prior to C[facility] was having > 12 migraine days a month and > 20 total headache days a month. Nurtec helps with the headaches as well. He is due to take aimovig September 1st, will change to emgality. They can last 8 hours to 3 days-4 days. No aura or prodrome. No other focal neurologic deficits, associated symptoms, inciting events or modifiable factors.     Reviewed notes, labs and imaging from outside physicians, which showed: Reviewed notes and all prior medications as below:   Current and past medications: ANALGESICS: Anacin, Goody's, codeine, Combunox,  Darvon, Excedrin, Kadian, Lidoderm patch, Lorcet, Percocet, Tylenol, Ultram, Vicodin ANTI-MIGRAINE: Dhe nasal spray, Imitrex, Nurtec, maxalt HEART/BP: Coreg, propranolol, Toprol, Lotensin, Norvasc, atenolol, Cardizem DECONGESTANT/ANTIHISTAMINE: Allegra, Benadryl, Clarinex, Dramamine Entex T, Flonase, Nasonex, Sudafed, Zyrtec ANTI-NAUSEANT: Dramamine NSAIDS: Advil,  Aleve MUSCLE RELAXANTS:  ANTI-CONVULSANTS: Topamax, Depakote, Keppra STEROIDS: Hydrocortisone, prednisone SLEEPING PILLS/TRANQUILIZERS: Ambien, Halcion, Tylenol PM, Valium, Xanax ANTI-DEPRESSANTS: Celexa HERBAL: CoQ10 FIBROMYALGIA:  HORMONAL: OTHER: Emgality, Aimovig(contrindicated his BP is elevated and worse on aimovig and also contraindicated due to constipation) PROCEDURES FOR HEADACHES: Botox   CT head: COMPARISON:  09/20/2018   FINDINGS: 05/03/2020 Brain: No acute infarct or hemorrhage. Lateral ventricles and midline structures are unremarkable. No acute extra-axial fluid collections. No mass effect.   Vascular: No hyperdense vessel or unexpected calcification.   Skull: Laceration left parietal convexity of the scalp. No underlying fracture. Remainder of the calvarium is unremarkable.   Sinuses/Orbits: No acute finding.   Other: None.   IMPRESSION: 1. Scalp laceration at the left parietal convexity. 2. No acute intracranial process.     Electronically Signed   By: Sharlet Salina M.D.   On: 05/03/2020 20:35   09/20/2018: MRI brain: FINDINGS: BRAIN: There is no acute infarct, acute hemorrhage or extra-axial collection. The midline structures are normal. No midline shift or other mass effect. The white matter signal is normal for the patient's age. The cerebral and cerebellar volume are age-appropriate. No hydrocephalus. Susceptibility-sensitive sequences show no chronic microhemorrhage or superficial siderosis. No abnormal contrast enhancement.   VASCULAR: The major intracranial arterial and venous sinus flow voids are normal.   SKULL AND UPPER CERVICAL SPINE: Calvarial bone marrow signal is normal. There is no skull base mass. Visualized upper cervical spine and soft tissues are normal.   SINUSES/ORBITS: No fluid levels or advanced mucosal thickening. No mastoid or middle ear effusion. The orbits are normal.   IMPRESSION: Normal aging brain.       Latest  Ref Rng & Units 12/07/2021    2:31 PM 08/25/2021    2:23 PM 06/26/2021    8:04 AM  CMP  Glucose 65 - 99 mg/dL 161  89  096   BUN 7 - 25 mg/dL 13  15  16    Creatinine 0.70 - 1.28 mg/dL 0.45  4.09  8.11   Sodium 135 - 146 mmol/L 136  135  136   Potassium 3.5 - 5.3 mmol/L 4.5  4.6  4.6   Chloride 98 - 110 mmol/L 100  100  99   CO2 20 - 32 mmol/L 27  28  29    Calcium 8.6 - 10.3 mg/dL 9.3  9.5  9.8   Total Protein 6.1 - 8.1 g/dL 6.6    6.9   Total Bilirubin 0.2 - 1.2 mg/dL 0.4    0.6   AST 10 - 35 U/L 14    23   ALT 9 - 46 U/L 13    20     REVIEW OF SYSTEMS: Out of a complete 14 system review of symptoms, the patient complains only of the following symptoms, and all other reviewed systems are negative.  ALLERGIES: Allergies  Allergen Reactions   Ibuprofen Swelling    Whole body swelled   Other Shortness Of Breath    BETA BLOCKERS   Topamax [Topiramate] Other (See Comments)    Pt states it made his tongue feel "scalded" and he couldn't eat    Toprol Xl [Metoprolol Succinate] Other (See Comments)    Personality change   Metoprolol-Hctz Er Other (See Comments)  Indigestion,mouth raw    HOME MEDICATIONS: Outpatient Medications Prior to Visit  Medication Sig Dispense Refill   Accu-Chek FastClix Lancets MISC Check blood sugar 2 (two) times daily as directed 102 each 5   acetaminophen (TYLENOL) 325 MG tablet Take 1-2 tablets (325-650 mg total) by mouth every 4 (four) hours as needed for mild pain.     amLODipine (NORVASC) 10 MG tablet Take 1 tablet (10 mg total) by mouth daily. 90 tablet 1   aspirin EC 81 MG tablet Take 81 mg by mouth at bedtime.     Atogepant (QULIPTA) 30 MG TABS Take 1 tablet (30 mg total) by mouth daily. 30 tablet 11   Blood Glucose Monitoring Suppl (ACCU-CHEK AVIVA PLUS) w/Device KIT Check BS bid E11.9 1 kit 1   cholecalciferol (VITAMIN D) 1000 UNITS tablet Take 1,000 Units by mouth at bedtime.      citalopram (CELEXA) 40 MG tablet Take 1 tablet (40 mg total) by  mouth daily. 90 tablet 1   Coenzyme Q10 (CO Q 10) 100 MG CAPS Take 100 mg by mouth daily with breakfast.      empagliflozin (JARDIANCE) 25 MG TABS tablet Take 1 tablet (25 mg total) by mouth daily before breakfast. 90 tablet 3   fexofenadine (ALLEGRA) 180 MG tablet Take 180 mg by mouth daily with breakfast.      fluticasone (FLONASE) 50 MCG/ACT nasal spray INSTILL 2 SPRAYS IN THE  NOSE AT BEDTIME 48 g 3   furosemide (LASIX) 40 MG tablet TAKE 1 TABLET BY MOUTH DAILY 90 tablet 3   glucose blood (ACCU-CHEK GUIDE) test strip Use to check blood sugar levels twice a day. 200 strip 2   hydrOXYzine (VISTARIL) 25 MG capsule Take 1 capsule (25 mg total) by mouth at bedtime as needed. 30 capsule 1   Lancets Misc. (ACCU-CHEK FASTCLIX LANCET) KIT USE AS DIRECTED TO CHECK BLOOD SUGAR TWICE DAILY 1 kit 1   metroNIDAZOLE (METROCREAM) 0.75 % cream Apply topically 2 (two) times daily to forehead, nose and cheeks 45 g 3   Multiple Vitamin (MULTIVITAMIN WITH MINERALS) TABS tablet Take 1 tablet by mouth daily with breakfast.     nitroGLYCERIN (NITROSTAT) 0.4 MG SL tablet PLACE ONE TABLET UNDER THE TONGUE EVERY 5 MINUTES AS NEEDED FOR CHEST PAIN 25 tablet 3   oxyCODONE-acetaminophen (PERCOCET) 10-325 MG tablet Take 1 tablet by mouth every 8 (eight) hours as needed for pain. 21 tablet 0   pantoprazole (PROTONIX) 40 MG tablet TAKE 1 TABLET BY MOUTH DAILY 90 tablet 1   Polyvinyl Alcohol-Povidone (REFRESH OP) Place 1 drop into both eyes at bedtime as needed (dry eyes).     potassium chloride (MICRO-K) 10 MEQ CR capsule TAKE 1 CAPSULE BY MOUTH DAILY 90 capsule 1   Rimegepant Sulfate 75 MG TBDP Take 75 mg by mouth daily as needed (Migraine). 16 tablet 11   rosuvastatin (CRESTOR) 40 MG tablet TAKE 1 TABLET BY MOUTH DAILY 90 tablet 1   telmisartan (MICARDIS) 40 MG tablet TAKE 1 TABLET BY MOUTH DAILY 90 tablet 3   tirzepatide (MOUNJARO) 12.5 MG/0.5ML Pen Inject 12.5 mg into the skin once a week. 6 mL 2   No  facility-administered medications prior to visit.    PAST MEDICAL HISTORY: Past Medical History:  Diagnosis Date   Allergic rhinitis    chronic sinusitis   Arthritis    osetoarthritis   CAD (coronary artery disease)    a. 2009 PCI/DES to mLAD; b. 05/2010 s/p DES RCA and LAD.;  c. 05/2013 Cath: LAD mild ISR, LCX nl, RCA 40p, patent stents.   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   Cataract    has had lasik surg   COPD (chronic obstructive pulmonary disease) (HCC)    Depression    Diabetes mellitus type 2, controlled, without complications (HCC)    Diabetes mellitus without complication (HCC)    Phreesia 08/22/2020   Diverticulosis    DJD (degenerative joint disease)    Esophagitis 1991   grade 1   GERD (gastroesophageal reflux disease)    Hiatal hernia   Hearing loss    wears bilateral hearing aids   Heart murmur    Dr Tanya Nones dx per patient   Hemorrhoids    Hyperlipidemia    Hypertension    Hypertensive heart disease    Iron deficiency anemia    Migraines    Myocardial infarction [facility])    Paroxysmal atrial fibrillation (HCC)    a. s/p afib ablation 10/22/08 (J. Allred).   Presence of permanent cardiac pacemaker    PVC's (premature ventricular contractions)    Sleep apnea    Uses nightly.  Questionalble: RDI during the total sleep time 6h 28 mins was 3.55/hr during REM sleep was at 5.71/hr. Supine AHI was 5.93/hr.   Special screening for malignant neoplasms, colon 10/01/2022   Syncope 08/2018    PAST SURGICAL HISTORY: Past Surgical History:  Procedure Laterality Date   ABDOMINAL HYSTERECTOMY     CARDIAC CATHETERIZATION  05/31/2010   The proximal LAD was then predilated with 2.0 x 12 trek. this was then stented with a 2.5 x 16 promus Element drug-eluting stent at 14 atmosphere and prostdilated with 2.75 x 12 [place] trek at 16 atmospheres(2.8 mm) resulting the reduction of 80% proximal LAD stenosis to 0% residual with excellent flow.   CARDIAC  CATHETERIZATION  06/05/2010   Successful percutaneous coronary intervention to the right coronary artery with percutaneous transluminal coronary angioplasty/stenting and insertion 3.0 x 16 mm Promus DES post dilated to 3.35 mm with stenosis being reduced to 0%   CARDIAC CATHETERIZATION  02/29/2008   Post dilatation was performed using a 2.75 x 9 [place] sprinter, 10 atmospheres for 40 seconds and then 9 atmosphere for 35 sec. This resulted in the 80% area of narrowing pre-intervention, now appearing to normal. There was no edvidence of the dissection or thrombus and there was TIMI III flow pre and post.   CARDIAC CATHETERIZATION  02/28/2006   COLONOSCOPY WITH PROPOFOL N/A 10/01/2022   Procedure: COLONOSCOPY WITH PROPOFOL;  Surgeon: Dolores Frame, MD;  Location: [facility];  Service: Gastroenterology;  Laterality: N/A;  8:00am;asa 3   CORONARY ANGIOPLASTY WITH STENT PLACEMENT     3 stents/ 4 caths   DISTAL BICEPS TENDON REPAIR Left 02/12/2022   Procedure: LEFT DISTAL BICEPS TENDON REPAIR;  Surgeon: Tarry Kos, MD;  Location: [facility] OR;  Service: Orthopedics;  Laterality: Left;   DISTAL BICEPS TENDON REPAIR Left 03/03/2022   Procedure: LEFT DISTAL BICEPS TENDON REPAIR;  Surgeon: Tarry Kos, MD;  Location: [facility] OR;  Service: Orthopedics;  Laterality: Left;   EYE SURGERY     FINGER ARTHROPLASTY Right 09/02/2021   Procedure: RIGHT THUMB CARPOMETACARPAL ARTHROPLASTY;  Surgeon: Tarry Kos, MD;  Location: [facility];  Service: Orthopedics;  Laterality: Right;  block in preop   KNEE ARTHROSCOPY     right   LASIK     LEFT HEART CATHETERIZATION WITH CORONARY ANGIOGRAM  N/A 06/29/2013   Procedure: LEFT HEART CATHETERIZATION WITH CORONARY ANGIOGRAM;  Surgeon: Marykay Lex, MD;  Location: [facility];  Service: Cardiovascular;  Laterality: N/A;   NASAL SINUS SURGERY  06/01/1999   NECK SURGERY     Cervical fusion   PACEMAKER IMPLANT N/A 05/01/2019   Procedure: PACEMAKER IMPLANT;   Surgeon: Hillis Range, MD;  Location: [facility]VAS[facility] CV LAB;  Service: Cardiovascular;  Laterality: N/A;   RADIOFREQUENCY ABLATION  09/28/2008   atrial fibrillation   ROTATOR CUFF REPAIR     left   SHOULDER OPEN ROTATOR CUFF REPAIR     right   SPINE SURGERY     WRIST ARTHROCENTESIS     left    FAMILY HISTORY: Family History  Problem Relation Age of Onset   Colon cancer Mother        mets from uterine   Uterine cancer Mother    Heart disease Father        and sister   Diabetes Father    Hearing loss Father    Stroke Father    Heart disease Sister    Lung cancer Sister    Stroke Brother    Migraines Neg Hx     SOCIAL HISTORY: Social History   Socioeconomic History   Marital status: Married    Spouse name: Dois Davenport   Number of children: 2   Years of education: Not on file   Highest education level: 12th grade  Occupational History   Occupation: retired  Tobacco Use   Smoking status: Former    Current packs/day: 0.00    Average packs/day: 2.0 packs/day for 25.0 years (50.0 ttl pk-yrs)    Types: Cigarettes, Cigars    Start date: 04/23/1963    Quit date: 04/22/1988    Years since quitting: 35.1    Passive exposure: Past   Smokeless tobacco: Never  Vaping Use   Vaping status: Never Used  Substance and Sexual Activity   Alcohol use: Not Currently   Drug use: No   Sexual activity: Not Currently    Birth control/protection: Surgical    Comment: vas  Other Topics Concern   Not on file  Social History Narrative   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.   Currently works part time for [employer].   2 daughters.    Married x 43 years 09/2021.   Caffiene:  1 cup daily, 2 diet drinks daily. (Caffiene free most time).    Social Drivers of Corporate investment banker Strain: Low Risk  (02/03/2023)   Overall Financial Resource Strain (CARDIA)    Difficulty of Paying Living Expenses: Not hard at all  Food Insecurity: No Food Insecurity (02/03/2023)    Hunger Vital Sign    Worried About Running Out of Food in the Last Year: Never true    Ran Out of Food in the Last Year: Never true  Transportation Needs: No Transportation Needs (02/03/2023)   PRAPARE - Administrator, Civil Service (Medical): No    Lack of Transportation (Non-Medical): No  Physical Activity: Insufficiently Active (02/03/2023)   Exercise Vital Sign    Days of Exercise per Week: 3 days    Minutes of Exercise per Session: 30 min  Stress: No Stress Concern Present (02/03/2023)   [employer] of Occupational Health - Occupational Stress Questionnaire    Feeling of Stress : Not at all  Social Connections: Socially Integrated (02/03/2023)   Social Connection and Isolation Panel [  NHANES]    Frequency of Communication with Friends and Family: More than three times a week    Frequency of Social Gatherings with Friends and Family: Three times a week    Attends Religious Services: More than 4 times per year    Active Member of Clubs or Organizations: No    Attends Banker Meetings: More than 4 times per year    Marital Status: Married  Catering manager Violence: Not At Risk (09/02/2022)   Humiliation, Afraid, Rape, and Kick questionnaire    Fear of Current or Ex-Partner: No    Emotionally Abused: No    Physically Abused: No    Sexually Abused: No      PHYSICAL EXAM  Vitals:   06/22/23 1045  BP: 132/89  Pulse: 90  Weight: 194 lb 3.2 oz (88.1 kg)  Height: 5\' 8"  (1.727 m)    Body mass index is 29.53 kg/m.  Generalized: Well developed, in no acute distress   Neurological examination  Mentation: Alert oriented to time, place, history taking. Follows all commands speech and language fluent Cranial nerve II-XII: Pupils were equal round reactive to light. Extraocular movements were full, visual field were full on confrontational test. Facial sensation and strength were normal.  Head turning and shoulder shrug  were normal and symmetric. Motor: The  motor testing reveals 5 over 5 strength of all 4 extremities. Good symmetric motor tone is noted throughout.  Sensory: Sensory testing is intact to soft touch on all 4 extremities. No evidence of extinction is noted.  Coordination: Cerebellar testing reveals good finger-nose-finger and heel-to-shin bilaterally.  Gait and station: Gait is normal. DIAGNOSTIC DATA (LABS, IMAGING, TESTING) - I reviewed patient records, labs, notes, testing and imaging myself where available.  Lab Results  Component Value Date   WBC 9.8 02/07/2023   HGB 15.2 02/07/2023   HCT 46.4 02/07/2023   MCV 88.7 02/07/2023   PLT 289 02/07/2023      Component Value Date/Time   NA 135 02/07/2023 0916   K 4.3 02/07/2023 0916   CL 99 02/07/2023 0916   CO2 24 02/07/2023 0916   GLUCOSE 173 (H) 02/07/2023 0916   BUN 14 02/07/2023 0916   CREATININE 0.80 02/07/2023 0916   CALCIUM 9.7 02/07/2023 0916   PROT 7.0 02/07/2023 0916   ALBUMIN 3.9 05/01/2019 0358   AST 15 02/07/2023 0916   ALT 14 02/07/2023 0916   ALKPHOS 71 05/01/2019 0358   BILITOT 0.5 02/07/2023 0916   GFRNONAA >60 10/01/2022 0721   GFRNONAA 88 08/18/2020 0803   GFRAA 102 08/18/2020 0803   Lab Results  Component Value Date   CHOL 123 02/07/2023   HDL 55 02/07/2023   LDLCALC 51 02/07/2023   TRIG 88 02/07/2023   CHOLHDL 2.2 02/07/2023   Lab Results  Component Value Date   HGBA1C 5.9 (H) 02/07/2023   Lab Results  Component Value Date   VITAMINB12 566 06/22/2022   Lab Results  Component Value Date   TSH 1.200 05/01/2019      ASSESSMENT AND PLAN 73 y.o. year old male  has a past medical history of Allergic rhinitis, Arthritis, CAD (coronary artery disease), Cancer (HCC) (02/24/2022), Cataract, COPD (chronic obstructive pulmonary disease) (HCC), Depression, Diabetes mellitus type 2, controlled, without complications (HCC), Diabetes mellitus without complication (HCC), Diverticulosis, DJD (degenerative joint disease), Esophagitis (1991), GERD  (gastroesophageal reflux disease), Hearing loss, Heart murmur, Hemorrhoids, Hyperlipidemia, Hypertension, Hypertensive heart disease, Iron deficiency anemia, Migraines, Myocardial infarction (HCC), Paroxysmal atrial fibrillation (HCC),  Presence of permanent cardiac pacemaker, PVC's (premature ventricular contractions), Sleep apnea, Special screening for malignant neoplasms, colon (10/01/2022), and Syncope (08/2018). here with:  1.  Migraine headaches   Increase Qulipta 60 mg daily Advised patient that he should stop taking over-the-counter medicine.  This most likely is causing rebound headaches. Use Nurtec 75 mg for abortive therapy.  Only 1 tablet in 24 hours Advised to call if headaches do not improve Follow-up in 6 to 8 months or sooner if needed   Butch Penny, MSN, NP-C 06/22/2023, 10:09 AM [facility] [address], [address] [place], [place] [address] [phone]

## 2023-06-23 LAB — COMPLETE METABOLIC PANEL WITH GFR
AG Ratio: 1.8 (calc) (ref 1.0–2.5)
ALT: 18 U/L (ref 9–46)
AST: 17 U/L (ref 10–35)
Albumin: 4.4 g/dL (ref 3.6–5.1)
Alkaline phosphatase (APISO): 67 U/L (ref 35–144)
BUN: 11 mg/dL (ref 7–25)
CO2: 28 mmol/L (ref 20–32)
Calcium: 9.8 mg/dL (ref 8.6–10.3)
Chloride: 97 mmol/L — ABNORMAL LOW (ref 98–110)
Creat: 0.72 mg/dL (ref 0.70–1.28)
Globulin: 2.4 g/dL (ref 1.9–3.7)
Glucose, Bld: 83 mg/dL (ref 65–99)
Potassium: 4.5 mmol/L (ref 3.5–5.3)
Sodium: 135 mmol/L (ref 135–146)
Total Bilirubin: 0.5 mg/dL (ref 0.2–1.2)
Total Protein: 6.8 g/dL (ref 6.1–8.1)
eGFR: 96 mL/min/{1.73_m2} (ref >=60)

## 2023-06-23 LAB — CBC WITH DIFFERENTIAL/PLATELET
Absolute Lymphocytes: 2009 {cells}/uL (ref 850–3900)
Absolute Monocytes: 540 {cells}/uL (ref 200–950)
Basophils Absolute: 71 {cells}/uL (ref 0–200)
Basophils Relative: 1 %
Eosinophils Absolute: 178 {cells}/uL (ref 15–500)
Eosinophils Relative: 2.5 %
HCT: 45.9 % (ref 38.5–50.0)
Hemoglobin: 15.2 g/dL (ref 13.2–17.1)
MCH: 30 pg (ref 27.0–33.0)
MCHC: 33.1 g/dL (ref 32.0–36.0)
MCV: 90.5 fL (ref 80.0–100.0)
MPV: 8.8 fL (ref 7.5–12.5)
Monocytes Relative: 7.6 %
Neutro Abs: 4303 {cells}/uL (ref 1500–7800)
Neutrophils Relative %: 60.6 %
Platelets: 266 10*3/uL (ref 140–400)
RBC: 5.07 10*6/uL (ref 4.20–5.80)
RDW: 11.6 % (ref 11.0–15.0)
Total Lymphocyte: 28.3 %
WBC: 7.1 10*3/uL (ref 3.8–10.8)

## 2023-06-23 LAB — HEMOGLOBIN A1C
Hgb A1c MFr Bld: 5.7 %{Hb} — ABNORMAL HIGH (ref <5.7)
Mean Plasma Glucose: 117 mg/dL
eAG (mmol/L): 6.5 mmol/L

## 2023-06-23 LAB — LIPID PANEL
Cholesterol: 130 mg/dL (ref <200)
HDL: 57 mg/dL (ref >=40)
LDL Cholesterol (Calc): 60 mg/dL
Non-HDL Cholesterol (Calc): 73 mg/dL (ref <130)
Total CHOL/HDL Ratio: 2.3 (calc) (ref <5.0)
Triglycerides: 53 mg/dL (ref <150)

## 2023-06-23 LAB — VITAMIN B12: Vitamin B-12: 601 pg/mL (ref 200–1100)

## 2023-06-23 LAB — PSA: PSA: 1.04 ng/mL (ref <=4.00)

## 2023-06-24 ENCOUNTER — Other Ambulatory Visit (HOSPITAL_COMMUNITY): Payer: Self-pay

## 2023-06-27 ENCOUNTER — Encounter: Payer: Self-pay | Admitting: Family Medicine

## 2023-06-27 ENCOUNTER — Other Ambulatory Visit (HOSPITAL_COMMUNITY): Payer: Self-pay

## 2023-06-27 ENCOUNTER — Ambulatory Visit (INDEPENDENT_AMBULATORY_CARE_PROVIDER_SITE_OTHER): Payer: Medicare Other | Admitting: Family Medicine

## 2023-06-27 VITALS — BP 122/68 | HR 83 | Temp 97.7°F | Ht 68.0 in | Wt 193.2 lb

## 2023-06-27 DIAGNOSIS — I10 Essential (primary) hypertension: Secondary | ICD-10-CM

## 2023-06-27 DIAGNOSIS — Z0001 Encounter for general adult medical examination with abnormal findings: Secondary | ICD-10-CM

## 2023-06-27 DIAGNOSIS — Z23 Encounter for immunization: Secondary | ICD-10-CM | POA: Diagnosis not present

## 2023-06-27 DIAGNOSIS — I2510 Atherosclerotic heart disease of native coronary artery without angina pectoris: Secondary | ICD-10-CM | POA: Diagnosis not present

## 2023-06-27 DIAGNOSIS — Z0000 Encounter for general adult medical examination without abnormal findings: Secondary | ICD-10-CM

## 2023-06-27 DIAGNOSIS — E119 Type 2 diabetes mellitus without complications: Secondary | ICD-10-CM | POA: Diagnosis not present

## 2023-06-27 DIAGNOSIS — I480 Paroxysmal atrial fibrillation: Secondary | ICD-10-CM | POA: Diagnosis not present

## 2023-06-27 DIAGNOSIS — Z7985 Long-term (current) use of injectable non-insulin antidiabetic drugs: Secondary | ICD-10-CM

## 2023-06-27 DIAGNOSIS — F32A Depression, unspecified: Secondary | ICD-10-CM

## 2023-06-27 MED ORDER — AMLODIPINE BESYLATE 10 MG PO TABS
10.0000 mg | ORAL_TABLET | Freq: Every day | ORAL | 1 refills | Status: DC
Start: 2023-06-27 — End: 2023-12-19
  Filled 2023-06-27 – 2023-09-21 (×2): qty 90, 90d supply, fill #0
  Filled 2023-12-15: qty 90, 90d supply, fill #1

## 2023-06-27 MED ORDER — CITALOPRAM HYDROBROMIDE 40 MG PO TABS
40.0000 mg | ORAL_TABLET | Freq: Every day | ORAL | 1 refills | Status: DC
  Filled 2023-06-27 – 2023-08-22 (×2): qty 90, 90d supply, fill #0
  Filled 2023-09-12 – 2023-11-20 (×2): qty 90, 90d supply, fill #1

## 2023-06-27 NOTE — Progress Notes (Signed)
Subjective:    Patient ID: Lee [name], male    DOB: 1950-07-07, 73 y.o.   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  His last colonoscopy was 2024, and it was clear.  Had echo 4/24 which showed mild AS, grade 1 diastolic dysfunction, and normal EF.  He is due for prostate cancer screening with a PSA.  His most recent lab work is listed below: Lab on 06/22/2023  Component Date Value Ref Range Status   WBC 06/22/2023 7.1  3.8 - 10.8 Thousand/uL Final   RBC 06/22/2023 5.07  4.20 - 5.80 Million/uL Final   Hemoglobin 06/22/2023 15.2  13.2 - 17.1 g/dL Final   HCT 40/98/1191 45.9  38.5 - 50.0 % Final   MCV 06/22/2023 90.5  80.0 - 100.0 fL Final   MCH 06/22/2023 30.0  27.0 - 33.0 pg Final   MCHC 06/22/2023 33.1  32.0 - 36.0 g/dL Final   Comment: For adults, a slight decrease in the calculated MCHC value (in the range of 30 to 32 g/dL) is most likely not clinically significant; however, it should be interpreted with caution in correlation with other red cell parameters and the patient's clinical condition.    RDW 06/22/2023 11.6  11.0 - 15.0 % Final   Platelets 06/22/2023 266  140 - 400 Thousand/uL Final   MPV 06/22/2023 8.8  7.5 - 12.5 fL Final   Neutro Abs 06/22/2023 4,303  1,500 - 7,800 cells/uL Final   Absolute Lymphocytes 06/22/2023 2,009  850 - 3,900 cells/uL Final   Absolute Monocytes 06/22/2023 540  200 - 950 cells/uL Final   Eosinophils Absolute 06/22/2023 178  15 - 500 cells/uL Final   Basophils Absolute 06/22/2023 71  0 - 200 cells/uL Final   Neutrophils Relative % 06/22/2023 60.6  % Final   Total Lymphocyte 06/22/2023 28.3  % Final   Monocytes Relative 06/22/2023 7.6  % Final   Eosinophils Relative 06/22/2023 2.5  %  Final   Basophils Relative 06/22/2023 1.0  % Final   Glucose, Bld 06/22/2023 83  65 - 99 mg/dL Final   Comment: .            Fasting reference interval .    BUN 06/22/2023 11  7 - 25 mg/dL Final   Creat 47/82/9562 0.72  0.70 - 1.28 mg/dL Final   eGFR 13/12/6576 96  > OR = 60 mL/min/1.47m2 Final   BUN/Creatinine Ratio 06/22/2023 SEE NOTE:  6 - 22 (calc) Final   Comment:    Not Reported: BUN and Creatinine are within    reference range. .    Sodium 06/22/2023 135  135 - 146 mmol/L Final   Potassium 06/22/2023 4.5  3.5 - 5.3 mmol/L Final   Chloride 06/22/2023 97 (L)  98 - 110 mmol/L Final   CO2 06/22/2023 28  20 - 32 mmol/L Final   Calcium 06/22/2023 9.8  8.6 - 10.3 mg/dL Final   Total Protein 46/96/2952 6.8  6.1 - 8.1 g/dL Final   Albumin 84/13/2440 4.4  3.6 - 5.1 g/dL Final   Globulin 03/27/2535 2.4  1.9 - 3.7 g/dL (calc) Final   AG Ratio 06/22/2023 1.8  1.0 - 2.5 (calc) Final  Total Bilirubin 06/22/2023 0.5  0.2 - 1.2 mg/dL Final   Alkaline phosphatase (APISO) 06/22/2023 67  35 - 144 U/L Final   AST 06/22/2023 17  10 - 35 U/L Final   ALT 06/22/2023 18  9 - 46 U/L Final   Cholesterol 06/22/2023 130  <200 mg/dL Final   HDL 16/03/9603 57  > OR = 40 mg/dL Final   Triglycerides 54/01/8118 53  <150 mg/dL Final   LDL Cholesterol (Calc) 06/22/2023 60  mg/dL (calc) Final   Comment: Reference range: <100 . Desirable range <100 mg/dL for primary prevention;   <70 mg/dL for patients with CHD or diabetic patients  with > or = 2 CHD risk factors. Marland Kitchen LDL-C is now calculated using the Martin-Hopkins  calculation, which is a validated novel method providing  better accuracy than the Friedewald equation in the  estimation of LDL-C.  Horald Pollen et al. Lenox Ahr. 1478;295(62): 2061-2068  (http://education.[facility].com/faq/FAQ164)    Total CHOL/HDL Ratio 06/22/2023 2.3  <1.3 (calc) Final   Non-HDL Cholesterol (Calc) 06/22/2023 73  <130 mg/dL (calc) Final   Comment: For patients with  diabetes plus 1 major ASCVD risk  factor, treating to a non-HDL-C goal of <100 mg/dL  (LDL-C of <08 mg/dL) is considered a therapeutic  option.    Hgb A1c MFr Bld 06/22/2023 5.7 (H)  <5.7 % of total Hgb Final   Comment: For someone without known diabetes, a hemoglobin  A1c value between 5.7% and 6.4% is consistent with prediabetes and should be confirmed with a  follow-up test. . For someone with known diabetes, a value <7% indicates that their diabetes is well controlled. A1c targets should be individualized based on duration of diabetes, age, comorbid conditions, and other considerations. . This assay result is consistent with an increased risk of diabetes. . Currently, no consensus exists regarding use of hemoglobin A1c for diagnosis of diabetes for children. .    Mean Plasma Glucose 06/22/2023 117  mg/dL Final   eAG (mmol/L) 65/78/4696 6.5  mmol/L Final   Vitamin B-12 06/22/2023 601  200 - 1,100 pg/mL Final   PSA 06/22/2023 1.04  < OR = 4.00 ng/mL Final   Comment: The total PSA value from this assay system is  standardized against the WHO standard. The test  result will be approximately 20% lower when compared  to the equimolar-standardized total PSA (Beckman  Coulter). Comparison of serial PSA results should be  interpreted with this fact in mind. . This test was performed using the Siemens  chemiluminescent method. Values obtained from  different assay methods cannot be used interchangeably. PSA levels, regardless of value, should not be interpreted as absolute evidence of the presence or absence of disease.     Immunization History  Administered Date(s) Administered   Fluad Quad(high Dose 65+) 03/29/2022   Influenza Split 03/04/2013, 02/28/2014, 03/01/2015, 02/26/2016   Influenza, Seasonal, Injecte, Preservative Fre 03/04/2013, 02/28/2014   Influenza,trivalent, recombinat, inj, PF 03/01/2015, 02/26/2016   Influenza-Unspecified 03/04/2013, 02/28/2014,  03/01/2015, 02/26/2016, 03/14/2021   Moderna Sars-Covid-2 Vaccination 07/14/2019, 08/10/2019   Pneumococcal Conjugate-13 05/29/2015   Pneumococcal Polysaccharide-23 06/14/2004, 09/17/2016   Pneumococcal-Unspecified 06/14/2004, 09/17/2016   Tdap 03/30/2012, 12/07/2018   Zoster Recombinant(Shingrix) 12/30/2020, 04/18/2021   Zoster, Live 03/30/2012    Past Medical History:  Diagnosis Date   Allergic rhinitis    chronic sinusitis   Arthritis    osetoarthritis   CAD (coronary artery disease)    a. 2009 PCI/DES to mLAD; b. 05/2010 s/p DES RCA and LAD.; c.  05/2013 Cath: LAD mild ISR, LCX nl, RCA 40p, patent stents.   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   Cataract    has had lasik surg   COPD (chronic obstructive pulmonary disease) (HCC)    Depression    Diabetes mellitus type 2, controlled, without complications (HCC)    Diabetes mellitus without complication (HCC)    Phreesia 08/22/2020   Diverticulosis    DJD (degenerative joint disease)    Esophagitis 1991   grade 1   GERD (gastroesophageal reflux disease)    Hiatal hernia   Hearing loss    wears bilateral hearing aids   Heart murmur    Dr Tanya Nones dx per patient   Hemorrhoids    Hyperlipidemia    Hypertension    Hypertensive heart disease    Iron deficiency anemia    Migraines    Myocardial infarction [facility])    Paroxysmal atrial fibrillation (HCC)    a. s/p afib ablation 10/22/08 (J. Allred).   Presence of permanent cardiac pacemaker    PVC's (premature ventricular contractions)    Sleep apnea    Uses nightly.  Questionalble: RDI during the total sleep time 6h 28 mins was 3.55/hr during REM sleep was at 5.71/hr. Supine AHI was 5.93/hr.   Special screening for malignant neoplasms, colon 10/01/2022   Syncope 08/2018   Past Surgical History:  Procedure Laterality Date   ABDOMINAL HYSTERECTOMY     CARDIAC CATHETERIZATION  05/31/2010   The proximal LAD was then predilated with 2.0 x 12 trek. this was  then stented with a 2.5 x 16 promus Element drug-eluting stent at 14 atmosphere and prostdilated with 2.75 x 12 Glide trek at 16 atmospheres(2.8 mm) resulting the reduction of 80% proximal LAD stenosis to 0% residual with excellent flow.   CARDIAC CATHETERIZATION  06/05/2010   Successful percutaneous coronary intervention to the right coronary artery with percutaneous transluminal coronary angioplasty/stenting and insertion 3.0 x 16 mm Promus DES post dilated to 3.35 mm with stenosis being reduced to 0%   CARDIAC CATHETERIZATION  02/29/2008   Post dilatation was performed using a 2.75 x 9 Argyle sprinter, 10 atmospheres for 40 seconds and then 9 atmosphere for 35 sec. This resulted in the 80% area of narrowing pre-intervention, now appearing to normal. There was no edvidence of the dissection or thrombus and there was TIMI III flow pre and post.   CARDIAC CATHETERIZATION  02/28/2006   COLONOSCOPY WITH PROPOFOL N/A 10/01/2022   Procedure: COLONOSCOPY WITH PROPOFOL;  Surgeon: Dolores Frame, MD;  Location: [facility];  Service: Gastroenterology;  Laterality: N/A;  8:00am;asa 3   CORONARY ANGIOPLASTY WITH STENT PLACEMENT     3 stents/ 4 caths   DISTAL BICEPS TENDON REPAIR Left 02/12/2022   Procedure: LEFT DISTAL BICEPS TENDON REPAIR;  Surgeon: Tarry Kos, MD;  Location: [facility] OR;  Service: Orthopedics;  Laterality: Left;   DISTAL BICEPS TENDON REPAIR Left 03/03/2022   Procedure: LEFT DISTAL BICEPS TENDON REPAIR;  Surgeon: Tarry Kos, MD;  Location: [facility] OR;  Service: Orthopedics;  Laterality: Left;   EYE SURGERY     FINGER ARTHROPLASTY Right 09/02/2021   Procedure: RIGHT THUMB CARPOMETACARPAL ARTHROPLASTY;  Surgeon: Tarry Kos, MD;  Location: [facility];  Service: Orthopedics;  Laterality: Right;  block in preop   KNEE ARTHROSCOPY     right   LASIK     LEFT HEART CATHETERIZATION WITH CORONARY ANGIOGRAM N/A 06/29/2013   Procedure:  LEFT HEART CATHETERIZATION WITH CORONARY  ANGIOGRAM;  Surgeon: Marykay Lex, MD;  Location: [facility] CATH LAB;  Service: Cardiovascular;  Laterality: N/A;   NASAL SINUS SURGERY  06/01/1999   NECK SURGERY     Cervical fusion   PACEMAKER IMPLANT N/A 05/01/2019   Procedure: PACEMAKER IMPLANT;  Surgeon: Hillis Range, MD;  Location: [facility] INVASIVE CV LAB;  Service: Cardiovascular;  Laterality: N/A;   RADIOFREQUENCY ABLATION  09/28/2008   atrial fibrillation   ROTATOR CUFF REPAIR     left   SHOULDER OPEN ROTATOR CUFF REPAIR     right   SPINE SURGERY     WRIST ARTHROCENTESIS     left   Current Outpatient Medications on File Prior to Visit  Medication Sig Dispense Refill   Accu-Chek FastClix Lancets MISC Check blood sugar 2 (two) times daily as directed 102 each 5   acetaminophen (TYLENOL) 325 MG tablet Take 1-2 tablets (325-650 mg total) by mouth every 4 (four) hours as needed for mild pain.     aspirin EC 81 MG tablet Take 81 mg by mouth at bedtime.     Atogepant (QULIPTA) 60 MG TABS Take 1 tablet (60 mg total) by mouth daily. 90 tablet 3   Blood Glucose Monitoring Suppl (ACCU-CHEK AVIVA PLUS) w/Device KIT Check BS bid E11.9 1 kit 1   cholecalciferol (VITAMIN D) 1000 UNITS tablet Take 1,000 Units by mouth at bedtime.      Coenzyme Q10 (CO Q 10) 100 MG CAPS Take 100 mg by mouth daily with breakfast.      empagliflozin (JARDIANCE) 25 MG TABS tablet Take 1 tablet (25 mg total) by mouth daily before breakfast. 90 tablet 3   fexofenadine (ALLEGRA) 180 MG tablet Take 180 mg by mouth daily with breakfast.      fluticasone (FLONASE) 50 MCG/ACT nasal spray INSTILL 2 SPRAYS IN THE  NOSE AT BEDTIME 48 g 3   furosemide (LASIX) 40 MG tablet TAKE 1 TABLET BY MOUTH DAILY 90 tablet 3   glucose blood (ACCU-CHEK GUIDE) test strip Use to check blood sugar levels twice a day. 200 strip 2   glucose blood test strip Check blood sugar 2 (two) times daily. 100 each 12   hydrOXYzine (VISTARIL) 25 MG capsule Take 1 capsule (25 mg total) by mouth at bedtime as  needed. (Patient not taking: Reported on 06/22/2023) 30 capsule 1   Lancets Misc. (ACCU-CHEK FASTCLIX LANCET) KIT USE AS DIRECTED TO CHECK BLOOD SUGAR TWICE DAILY 1 kit 1   metroNIDAZOLE (METROCREAM) 0.75 % cream Apply topically 2 (two) times daily to forehead, nose and cheeks 45 g 3   Multiple Vitamin (MULTIVITAMIN WITH MINERALS) TABS tablet Take 1 tablet by mouth daily with breakfast.     nitroGLYCERIN (NITROSTAT) 0.4 MG SL tablet PLACE ONE TABLET UNDER THE TONGUE EVERY 5 MINUTES AS NEEDED FOR CHEST PAIN 25 tablet 3   oxyCODONE-acetaminophen (PERCOCET) 10-325 MG tablet Take 1 tablet by mouth every 8 (eight) hours as needed for pain. 21 tablet 0   pantoprazole (PROTONIX) 40 MG tablet TAKE 1 TABLET BY MOUTH DAILY 90 tablet 1   Polyvinyl Alcohol-Povidone (REFRESH OP) Place 1 drop into both eyes at bedtime as needed (dry eyes).     potassium chloride (MICRO-K) 10 MEQ CR capsule TAKE 1 CAPSULE BY MOUTH DAILY 90 capsule 1   Rimegepant Sulfate 75 MG TBDP Take 1 tablet (75 mg total) by mouth daily as needed for migraine. 16 tablet 11   rosuvastatin (CRESTOR) 40 MG tablet  TAKE 1 TABLET BY MOUTH DAILY 90 tablet 1   telmisartan (MICARDIS) 40 MG tablet TAKE 1 TABLET BY MOUTH DAILY 90 tablet 3   tirzepatide (MOUNJARO) 12.5 MG/0.5ML Pen Inject 12.5 mg into the skin once a week. 6 mL 2   No current facility-administered medications on file prior to visit.   Allergies  Allergen Reactions   Ibuprofen Swelling    Whole body swelled   Other Shortness Of Breath    BETA BLOCKERS   Topamax [Topiramate] Other (See Comments)    Pt states it made his tongue feel "scalded" and he couldn't eat    Toprol Xl [Metoprolol Succinate] Other (See Comments)    Personality change   Metoprolol-Hctz Er Other (See Comments)    Indigestion,mouth raw   Social History   Socioeconomic History   Marital status: Married    Spouse name: Dois Davenport   Number of children: 2   Years of education: Not on file   Highest education  level: 12th grade  Occupational History   Occupation: retired  Tobacco Use   Smoking status: Former    Current packs/day: 0.00    Average packs/day: 2.0 packs/day for 25.0 years (50.0 ttl pk-yrs)    Types: Cigarettes, Cigars    Start date: 04/23/1963    Quit date: 04/22/1988    Years since quitting: 35.2    Passive exposure: Past   Smokeless tobacco: Never  Vaping Use   Vaping status: Never Used  Substance and Sexual Activity   Alcohol use: Not Currently   Drug use: No   Sexual activity: Not Currently    Birth control/protection: Surgical    Comment: vas  Other Topics Concern   Not on file  Social History Narrative   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   Currently works part time for [employer].   2 daughters.    Married x 43 years 09/2021.   Caffiene:  1 cup daily, 2 diet drinks daily. (Caffiene free most time).    Social Drivers of Corporate investment banker Strain: Low Risk  (02/03/2023)   Overall Financial Resource Strain (CARDIA)    Difficulty of Paying Living Expenses: Not hard at all  Food Insecurity: No Food Insecurity (02/03/2023)   Hunger Vital Sign    Worried About Running Out of Food in the Last Year: Never true    Ran Out of Food in the Last Year: Never true  Transportation Needs: No Transportation Needs (02/03/2023)   PRAPARE - Administrator, Civil Service (Medical): No    Lack of Transportation (Non-Medical): No  Physical Activity: Insufficiently Active (02/03/2023)   Exercise Vital Sign    Days of Exercise per Week: 3 days    Minutes of Exercise per Session: 30 min  Stress: No Stress Concern Present (02/03/2023)   [employer] of Occupational Health - Occupational Stress Questionnaire    Feeling of Stress : Not at all  Social Connections: Socially Integrated (02/03/2023)   Social Connection and Isolation Panel [NHANES]    Frequency of Communication with Friends and Family: More than three times a week    Frequency of  Social Gatherings with Friends and Family: Three times a week    Attends Religious Services: More than 4 times per year    Active Member of Clubs or Organizations: No    Attends Engineer, structural: More than 4 times per year    Marital Status: Married  Catering manager Violence: Not  At Risk (09/02/2022)   Humiliation, Afraid, Rape, and Kick questionnaire    Fear of Current or Ex-Partner: No    Emotionally Abused: No    Physically Abused: No    Sexually Abused: No   Family History  Problem Relation Age of Onset   Colon cancer Mother        mets from uterine   Uterine cancer Mother    Heart disease Father        and sister   Diabetes Father    Hearing loss Father    Stroke Father    Heart disease Sister    Lung cancer Sister    Stroke Brother    Migraines Neg Hx      Review of Systems  All other systems reviewed and are negative.      Objective:   Physical Exam Vitals reviewed.  Constitutional:      General: He is not in acute distress.    Appearance: He is well-developed. He is not diaphoretic.  HENT:     Head: Normocephalic and atraumatic.     Right Ear: External ear normal.     Left Ear: External ear normal.     Nose: Nose normal.     Mouth/Throat:     Pharynx: No oropharyngeal exudate.  Eyes:     General: No scleral icterus.       Right eye: No discharge.        Left eye: No discharge.     Conjunctiva/sclera: Conjunctivae normal.     Pupils: Pupils are equal, round, and reactive to light.  Neck:     Thyroid: No thyromegaly.     Vascular: No JVD.     Trachea: No tracheal deviation.  Cardiovascular:     Rate and Rhythm: Normal rate and regular rhythm.     Heart sounds: Murmur heard.     No friction rub. No gallop.  Pulmonary:     Effort: Pulmonary effort is normal. No respiratory distress.     Breath sounds: Normal breath sounds. No stridor. No wheezing or rales.  Chest:     Chest wall: No tenderness.  Abdominal:     General: Bowel sounds  are normal. There is no distension.     Palpations: Abdomen is soft. There is no mass.     Tenderness: There is no abdominal tenderness. There is no guarding or rebound.     Hernia: No hernia is present.  Musculoskeletal:        General: No tenderness. Normal range of motion.     Cervical back: Normal range of motion and neck supple.  Lymphadenopathy:     Cervical: No cervical adenopathy.  Skin:    General: Skin is warm.     Capillary Refill: Capillary refill takes less than 2 seconds.     Coloration: Skin is not pale.     Findings: No erythema or rash.  Neurological:     Mental Status: He is alert and oriented to person, place, and time.     Cranial Nerves: No cranial nerve deficit.     Sensory: No sensory deficit.     Motor: No abnormal muscle tone.     Coordination: Coordination normal.     Deep Tendon Reflexes: Reflexes normal.  Psychiatric:        Behavior: Behavior normal.        Thought Content: Thought content normal.        Judgment: Judgment normal.  Assessment & Plan:  Type 2 diabetes mellitus treated without insulin (HCC)  Coronary artery disease involving native coronary artery of native heart without angina pectoris  Paroxysmal atrial fibrillation (HCC)  Essential hypertension - Plan: amLODipine (NORVASC) 10 MG tablet  Depression, unspecified depression type - Plan: citalopram (CELEXA) 40 MG tablet Blood work is outstanding.  Blood pressure is excellent.  LDL cholesterol borderline at 60 but I believe acceptable.  Hemoglobin A1c is outstanding.  I congratulated the patient on his continued weight loss.  He received the new pneumonia vaccine today.  The remainder of his immunizations are up-to-date.  His PSA is excellent.  He does not require another colonoscopy.  Regular anticipatory guidance is provided.  Diabetic foot exam is normal.

## 2023-06-27 NOTE — Addendum Note (Signed)
Addended by: Venia Carbon K on: 06/27/2023 12:08 PM   Modules accepted: Orders

## 2023-07-01 NOTE — Addendum Note (Signed)
Addended by: Elease Etienne A on: 07/01/2023 08:25 AM   Modules accepted: Orders

## 2023-07-01 NOTE — Progress Notes (Signed)
 Remote pacemaker transmission.

## 2023-07-26 ENCOUNTER — Other Ambulatory Visit: Payer: Self-pay

## 2023-07-26 ENCOUNTER — Other Ambulatory Visit (HOSPITAL_COMMUNITY): Payer: Self-pay

## 2023-07-26 ENCOUNTER — Other Ambulatory Visit: Payer: Self-pay | Admitting: Family Medicine

## 2023-07-26 DIAGNOSIS — E118 Type 2 diabetes mellitus with unspecified complications: Secondary | ICD-10-CM

## 2023-07-26 MED ORDER — ACCU-CHEK FASTCLIX LANCETS MISC
Freq: Two times a day (BID) | 5 refills | Status: AC
  Filled 2023-07-26: qty 102, 51d supply, fill #0
  Filled 2023-09-12: qty 102, 51d supply, fill #1
  Filled 2023-11-10: qty 102, 51d supply, fill #2
  Filled 2024-02-19: qty 102, 51d supply, fill #3
  Filled 2024-04-30: qty 102, 51d supply, fill #4
  Filled 2024-06-17: qty 102, 51d supply, fill #5

## 2023-07-28 ENCOUNTER — Other Ambulatory Visit (HOSPITAL_COMMUNITY): Payer: Self-pay

## 2023-08-03 ENCOUNTER — Ambulatory Visit (HOSPITAL_COMMUNITY): Payer: Medicare Other | Attending: Cardiology

## 2023-08-03 DIAGNOSIS — I350 Nonrheumatic aortic (valve) stenosis: Secondary | ICD-10-CM | POA: Insufficient documentation

## 2023-08-03 LAB — ECHOCARDIOGRAM COMPLETE
AR max vel: 1.3 cm2
AV Area VTI: 1.3 cm2
AV Area mean vel: 1.33 cm2
AV Mean grad: 12 mmHg
AV Peak grad: 18.9 mmHg
Ao pk vel: 2.18 m/s
Est EF: 55
S' Lateral: 2.3 cm

## 2023-08-07 ENCOUNTER — Encounter: Payer: Self-pay | Admitting: Family Medicine

## 2023-08-08 ENCOUNTER — Other Ambulatory Visit: Payer: Self-pay

## 2023-08-08 ENCOUNTER — Other Ambulatory Visit (HOSPITAL_COMMUNITY): Payer: Self-pay

## 2023-08-08 DIAGNOSIS — I480 Paroxysmal atrial fibrillation: Secondary | ICD-10-CM

## 2023-08-08 DIAGNOSIS — I2510 Atherosclerotic heart disease of native coronary artery without angina pectoris: Secondary | ICD-10-CM

## 2023-08-08 DIAGNOSIS — R002 Palpitations: Secondary | ICD-10-CM

## 2023-08-08 MED ORDER — NITROGLYCERIN 0.4 MG SL SUBL
SUBLINGUAL_TABLET | SUBLINGUAL | 3 refills | Status: DC
  Filled 2023-08-08: qty 25, 5d supply, fill #0
  Filled 2023-09-21: qty 25, 5d supply, fill #1
  Filled 2023-11-22: qty 25, 5d supply, fill #2
  Filled 2024-02-29: qty 25, 5d supply, fill #3

## 2023-08-12 ENCOUNTER — Other Ambulatory Visit (HOSPITAL_COMMUNITY): Payer: Self-pay

## 2023-08-16 ENCOUNTER — Ambulatory Visit: Admitting: Podiatry

## 2023-08-18 ENCOUNTER — Ambulatory Visit (INDEPENDENT_AMBULATORY_CARE_PROVIDER_SITE_OTHER): Admitting: Podiatry

## 2023-08-18 DIAGNOSIS — D2371 Other benign neoplasm of skin of right lower limb, including hip: Secondary | ICD-10-CM

## 2023-08-18 DIAGNOSIS — D2372 Other benign neoplasm of skin of left lower limb, including hip: Secondary | ICD-10-CM | POA: Diagnosis not present

## 2023-08-18 NOTE — Progress Notes (Signed)
 Who presents today chief complaint of painful subfifth metatarsal lesions.  The left beneath the fifth metatarsal head the right beneath the mid diaphyseal region of the fifth met and the base of the fifth met.  States that they have been painful for quite some time but the acid really helps.  Objective: Vitals are stable alert and oriented x 3.  Pulses are palpable.  The aforementioned reactive hyperkeratotic lesions are punctated there is no erythema cellulitis drainage or odor.  Assessment: Pain in limb secondary to benign skin lesions.  Plan: Mechanical and chemical debridement with Salinocaine under occlusion to be left on 3 days before being removed washed thoroughly Follow-up with me on an as-needed basis

## 2023-08-22 ENCOUNTER — Other Ambulatory Visit (HOSPITAL_COMMUNITY): Payer: Self-pay

## 2023-08-23 ENCOUNTER — Other Ambulatory Visit (HOSPITAL_COMMUNITY): Payer: Self-pay

## 2023-08-23 MED FILL — Rosuvastatin Calcium Tab 40 MG: ORAL | 90 days supply | Qty: 90 | Fill #0 | Status: CN

## 2023-08-23 MED FILL — Pantoprazole Sodium EC Tab 40 MG (Base Equiv): ORAL | 90 days supply | Qty: 90 | Fill #0 | Status: AC

## 2023-08-23 MED FILL — Telmisartan Tab 40 MG: ORAL | 90 days supply | Qty: 90 | Fill #0 | Status: CN

## 2023-08-23 MED FILL — Potassium Chloride Cap ER 10 mEq: ORAL | 90 days supply | Qty: 90 | Fill #0 | Status: CN

## 2023-08-23 MED FILL — Furosemide Tab 40 MG: ORAL | 90 days supply | Qty: 90 | Fill #0 | Status: CN

## 2023-08-26 ENCOUNTER — Ambulatory Visit (INDEPENDENT_AMBULATORY_CARE_PROVIDER_SITE_OTHER): Payer: Medicare Other

## 2023-08-26 DIAGNOSIS — I442 Atrioventricular block, complete: Secondary | ICD-10-CM | POA: Diagnosis not present

## 2023-08-26 LAB — CUP PACEART REMOTE DEVICE CHECK
Battery Remaining Longevity: 54 mo
Battery Remaining Percentage: 55 %
Battery Voltage: 2.98 V
Brady Statistic AP VP Percent: 1 %
Brady Statistic AP VS Percent: 1 %
Brady Statistic AS VP Percent: 99 %
Brady Statistic AS VS Percent: 1 %
Brady Statistic RA Percent Paced: 1 %
Brady Statistic RV Percent Paced: 99 %
Date Time Interrogation Session: 20250328020014
Implantable Lead Connection Status: 753985
Implantable Lead Connection Status: 753985
Implantable Lead Implant Date: 20201201
Implantable Lead Implant Date: 20201201
Implantable Lead Location: 753859
Implantable Lead Location: 753860
Implantable Pulse Generator Implant Date: 20201201
Lead Channel Impedance Value: 440 Ohm
Lead Channel Impedance Value: 460 Ohm
Lead Channel Pacing Threshold Amplitude: 0.75 V
Lead Channel Pacing Threshold Amplitude: 1 V
Lead Channel Pacing Threshold Pulse Width: 0.5 ms
Lead Channel Pacing Threshold Pulse Width: 0.5 ms
Lead Channel Sensing Intrinsic Amplitude: 3.2 mV
Lead Channel Sensing Intrinsic Amplitude: 3.4 mV
Lead Channel Setting Pacing Amplitude: 2 V
Lead Channel Setting Pacing Amplitude: 2.5 V
Lead Channel Setting Pacing Pulse Width: 0.5 ms
Lead Channel Setting Sensing Sensitivity: 2 mV
Pulse Gen Model: 2272
Pulse Gen Serial Number: 9182765

## 2023-09-06 ENCOUNTER — Ambulatory Visit: Admitting: Family Medicine

## 2023-09-06 ENCOUNTER — Other Ambulatory Visit (HOSPITAL_COMMUNITY): Payer: Self-pay

## 2023-09-06 ENCOUNTER — Other Ambulatory Visit: Payer: Self-pay

## 2023-09-06 VITALS — BP 132/76 | HR 80 | Temp 97.5°F | Ht 68.0 in | Wt 182.0 lb

## 2023-09-06 DIAGNOSIS — I209 Angina pectoris, unspecified: Secondary | ICD-10-CM

## 2023-09-06 MED ORDER — TIRZEPATIDE 10 MG/0.5ML ~~LOC~~ SOAJ
10.0000 mg | SUBCUTANEOUS | 11 refills | Status: DC
Start: 2023-09-06 — End: 2024-01-31
  Filled 2023-09-06 (×2): qty 6, 84d supply, fill #0
  Filled 2023-11-22: qty 6, 84d supply, fill #1

## 2023-09-06 MED ORDER — ISOSORBIDE MONONITRATE ER 30 MG PO TB24
30.0000 mg | ORAL_TABLET | Freq: Every day | ORAL | 3 refills | Status: DC
  Filled 2023-09-06 (×2): qty 30, 30d supply, fill #0

## 2023-09-06 MED FILL — Furosemide Tab 40 MG: ORAL | 90 days supply | Qty: 90 | Fill #0 | Status: AC

## 2023-09-06 MED FILL — Potassium Chloride Cap ER 10 mEq: ORAL | 90 days supply | Qty: 90 | Fill #0 | Status: CN

## 2023-09-06 MED FILL — Telmisartan Tab 40 MG: ORAL | 90 days supply | Qty: 90 | Fill #0 | Status: AC

## 2023-09-06 MED FILL — Rosuvastatin Calcium Tab 40 MG: ORAL | 90 days supply | Qty: 90 | Fill #0 | Status: CN

## 2023-09-06 MED FILL — Furosemide Tab 40 MG: ORAL | 90 days supply | Qty: 90 | Fill #0 | Status: CN

## 2023-09-06 MED FILL — Potassium Chloride Cap ER 10 mEq: ORAL | 90 days supply | Qty: 90 | Fill #0 | Status: AC

## 2023-09-06 MED FILL — Telmisartan Tab 40 MG: ORAL | 90 days supply | Qty: 90 | Fill #0 | Status: CN

## 2023-09-06 MED FILL — Rosuvastatin Calcium Tab 40 MG: ORAL | 90 days supply | Qty: 90 | Fill #0 | Status: AC

## 2023-09-06 NOTE — Progress Notes (Signed)
 Subjective:    Patient ID: Lee [name], male    DOB: July 20, 1950, 73 y.o.   MRN: [id number]  Chest Pain   Patient is a 73 year old Caucasian gentleman with a history of coronary artery disease.  Originally had at stent placed in his LAD in 2009.  Subsequently required a stent in the LAD and the right coronary artery in 2012.  Last catheterization was in 2015 which showed 40% in-stent restenosis in the LAD and 40% stenosis in the RCA.  Patient has had intermittent chest pain for years however recently it has become much more severe.  Over the last 2 weeks he has had chest pain 2 to 3 days a week.  He will also have increasing shortness of breath and dyspnea on exertion.  He has been taking nitroglycerin.  The chest pain will usually resolve after 1-2 nitroglycerin pills.  The chest pain typically last 30 minutes.  At the present time he is pain-free.  EKG today shows a paced rhythm.  Patient denies any heartburn or indigestion.  He denies any tenderness to palpation in the chest wall muscles.  He denies any cough or pleurisy or hemoptysis or fever. Past Medical History:  Diagnosis Date   Allergic rhinitis    chronic sinusitis   Arthritis    osetoarthritis   CAD (coronary artery disease)    a. 2009 PCI/DES to mLAD; b. 05/2010 s/p DES RCA and LAD.; c. 05/2013 Cath: LAD mild ISR, LCX nl, RCA 40p, patent stents.   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   Cataract    has had lasik surg   COPD (chronic obstructive pulmonary disease) (HCC)    Depression    Diabetes mellitus type 2, controlled, without complications (HCC)    Diabetes mellitus without complication (HCC)    Phreesia 08/22/2020   Diverticulosis    DJD (degenerative joint disease)    Esophagitis 1991   grade 1   GERD (gastroesophageal reflux disease)    Hiatal hernia   Hearing loss    wears bilateral hearing aids   Heart murmur    Dr Tanya Nones dx per patient   Hemorrhoids    Hyperlipidemia     Hypertension    Hypertensive heart disease    Iron deficiency anemia    Migraines    Myocardial infarction [facility])    Paroxysmal atrial fibrillation (HCC)    a. s/p afib ablation 10/22/08 (J. Allred).   Presence of permanent cardiac pacemaker    PVC's (premature ventricular contractions)    Sleep apnea    Uses nightly.  Questionalble: RDI during the total sleep time 6h 28 mins was 3.55/hr during REM sleep was at 5.71/hr. Supine AHI was 5.93/hr.   Special screening for malignant neoplasms, colon 10/01/2022   Syncope 08/2018   Past Surgical History:  Procedure Laterality Date   ABDOMINAL HYSTERECTOMY     CARDIAC CATHETERIZATION  05/31/2010   The proximal LAD was then predilated with 2.0 x 12 trek. this was then stented with a 2.5 x 16 promus Element drug-eluting stent at 14 atmosphere and prostdilated with 2.75 x 12 [place] trek at 16 atmospheres(2.8 mm) resulting the reduction of 80% proximal LAD stenosis to 0% residual with excellent flow.   CARDIAC CATHETERIZATION  06/05/2010   Successful percutaneous coronary intervention to the right coronary artery with percutaneous transluminal coronary angioplasty/stenting and insertion 3.0 x 16 mm Promus DES post dilated to 3.35 mm with stenosis being reduced to 0%  CARDIAC CATHETERIZATION  02/29/2008   Post dilatation was performed using a 2.75 x 9 [place]ley sprinter, 10 atmospheres for 40 seconds and then 9 atmosphere for 35 sec. This resulted in the 80% area of narrowing pre-intervention, now appearing to normal. There was no edvidence of the dissection or thrombus and there was TIMI III flow pre and post.   CARDIAC CATHETERIZATION  02/28/2006   COLONOSCOPY WITH PROPOFOL N/A 10/01/2022   Procedure: COLONOSCOPY WITH PROPOFOL;  Surgeon: Dolores Frame, MD;  [facility];  Service: Gastroenterology;  Laterality: N/A;  8:00am;asa 3   CORONARY ANGIOPLASTY WITH STENT PLACEMENT     3 stents/ 4 caths   DISTAL BICEPS TENDON REPAIR Left 02/12/2022    Procedure: LEFT DISTAL BICEPS TENDON REPAIR;  Surgeon: Tarry Kos, MD;  Location: [facility] OR;  Service: Orthopedics;  Laterality: Left;   DISTAL BICEPS TENDON REPAIR Left 03/03/2022   Procedure: LEFT DISTAL BICEPS TENDON REPAIR;  Surgeon: Tarry Kos, MD;  Location: [facility] OR;  Service: Orthopedics;  Laterality: Left;   EYE SURGERY     FINGER ARTHROPLASTY Right 09/02/2021   Procedure: RIGHT THUMB CARPOMETACARPAL ARTHROPLASTY;  Surgeon: Tarry Kos, MD;  Location: [facility];  Service: Orthopedics;  Laterality: Right;  block in preop   KNEE ARTHROSCOPY     right   LASIK     LEFT HEART CATHETERIZATION WITH CORONARY ANGIOGRAM N/A 06/29/2013   Procedure: LEFT HEART CATHETERIZATION WITH CORONARY ANGIOGRAM;  Surgeon: Marykay Lex, MD;  Location: [facility] CATH LAB;  Service: Cardiovascular;  Laterality: N/A;   NASAL SINUS SURGERY  06/01/1999   NECK SURGERY     Cervical fusion   PACEMAKER IMPLANT N/A 05/01/2019   Procedure: PACEMAKER IMPLANT;  Surgeon: Hillis Range, MD;  Location: [facility] CV LAB;  Service: Cardiovascular;  Laterality: N/A;   RADIOFREQUENCY ABLATION  09/28/2008   atrial fibrillation   ROTATOR CUFF REPAIR     left   SHOULDER OPEN ROTATOR CUFF REPAIR     right   SPINE SURGERY     WRIST ARTHROCENTESIS     left   Current Outpatient Medications on File Prior to Visit  Medication Sig Dispense Refill   Accu-Chek FastClix Lancets MISC Check blood sugar 2 (two) times daily as directed 102 each 5   acetaminophen (TYLENOL) 325 MG tablet Take 1-2 tablets (325-650 mg total) by mouth every 4 (four) hours as needed for mild pain.     amLODipine (NORVASC) 10 MG tablet Take 1 tablet (10 mg total) by mouth daily. 90 tablet 1   aspirin EC 81 MG tablet Take 81 mg by mouth at bedtime.     Atogepant (QULIPTA) 60 MG TABS Take 1 tablet (60 mg total) by mouth daily. 90 tablet 3   Blood Glucose Monitoring Suppl (ACCU-CHEK AVIVA PLUS) w/Device KIT Check BS bid E11.9 1 kit 1    cholecalciferol (VITAMIN D) 1000 UNITS tablet Take 1,000 Units by mouth at bedtime.      citalopram (CELEXA) 40 MG tablet Take 1 tablet (40 mg total) by mouth daily. 90 tablet 1   Coenzyme Q10 (CO Q 10) 100 MG CAPS Take 100 mg by mouth daily with breakfast.      empagliflozin (JARDIANCE) 25 MG TABS tablet Take 1 tablet (25 mg total) by mouth daily before breakfast. 90 tablet 3   fexofenadine (ALLEGRA) 180 MG tablet Take 180 mg by mouth daily with breakfast.      fluticasone (FLONASE) 50 MCG/ACT nasal spray  INSTILL 2 SPRAYS IN THE  NOSE AT BEDTIME 48 g 3   furosemide (LASIX) 40 MG tablet Take 1 tablet (40 mg total) by mouth daily. 90 tablet 3   glucose blood (ACCU-CHEK GUIDE) test strip Use to check blood sugar levels twice a day. 200 strip 2   glucose blood test strip Check blood sugar 2 (two) times daily. 100 each 12   hydrOXYzine (VISTARIL) 25 MG capsule Take 1 capsule (25 mg total) by mouth at bedtime as needed. 30 capsule 1   Lancets Misc. (ACCU-CHEK FASTCLIX LANCET) KIT USE AS DIRECTED TO CHECK BLOOD SUGAR TWICE DAILY 1 kit 1   metroNIDAZOLE (METROCREAM) 0.75 % cream Apply topically 2 (two) times daily to forehead, nose and cheeks 45 g 3   Multiple Vitamin (MULTIVITAMIN WITH MINERALS) TABS tablet Take 1 tablet by mouth daily with breakfast.     nitroGLYCERIN (NITROSTAT) 0.4 MG SL tablet PLACE ONE TABLET UNDER THE TONGUE EVERY 5 MINUTES AS NEEDED FOR CHEST PAIN 25 tablet 3   oxyCODONE-acetaminophen (PERCOCET) 10-325 MG tablet Take 1 tablet by mouth every 8 (eight) hours as needed for pain. 21 tablet 0   pantoprazole (PROTONIX) 40 MG tablet Take 1 tablet (40 mg total) by mouth daily. 90 tablet 1   Polyvinyl Alcohol-Povidone (REFRESH OP) Place 1 drop into both eyes at bedtime as needed (dry eyes).     potassium chloride (MICRO-K) 10 MEQ CR capsule Take 1 capsule (10 mEq total) by mouth daily. 90 capsule 1   Rimegepant Sulfate 75 MG TBDP Take 1 tablet (75 mg total) by mouth daily as needed for  migraine. 16 tablet 11   rosuvastatin (CRESTOR) 40 MG tablet Take 1 tablet (40 mg total) by mouth daily. 90 tablet 1   telmisartan (MICARDIS) 40 MG tablet Take 1 tablet (40 mg total) by mouth daily. 90 tablet 3   No current facility-administered medications on file prior to visit.   Allergies  Allergen Reactions   Ibuprofen Swelling    Whole body swelled   Other Shortness Of Breath    BETA BLOCKERS   Topamax [Topiramate] Other (See Comments)    Pt states it made his tongue feel "scalded" and he couldn't eat    Toprol Xl [Metoprolol Succinate] Other (See Comments)    Personality change   Metoprolol-Hctz Er Other (See Comments)    Indigestion,mouth raw   Social History   Socioeconomic History   Marital status: Married    Spouse name: Dois Davenport   Number of children: 2   Years of education: Not on file   Highest education level: 12th grade  Occupational History   Occupation: retired  Tobacco Use   Smoking status: Former    Current packs/day: 0.00    Average packs/day: 2.0 packs/day for 25.0 years (50.0 ttl pk-yrs)    Types: Cigarettes, Cigars    Start date: 04/23/1963    Quit date: 04/22/1988    Years since quitting: 35.3    Passive exposure: Past   Smokeless tobacco: Never  Vaping Use   Vaping status: Never Used  Substance and Sexual Activity   Alcohol use: Not Currently   Drug use: No   Sexual activity: Not Currently    Birth control/protection: Surgical    Comment: vas  Other Topics Concern   Not on file  Social History Narrative   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   Currently works part time for [employer].   2 daughters.    Married  x 43 years 09/2021.   Caffiene:  1 cup daily, 2 diet drinks daily. (Caffiene free most time).    Social Drivers of Corporate investment banker Strain: Low Risk  (09/02/2023)   Overall Financial Resource Strain (CARDIA)    Difficulty of Paying Living Expenses: Not hard at all  Food Insecurity: No Food  Insecurity (09/02/2023)   Hunger Vital Sign    Worried About Running Out of Food in the Last Year: Never true    Ran Out of Food in the Last Year: Never true  Transportation Needs: No Transportation Needs (09/02/2023)   PRAPARE - Administrator, Civil Service (Medical): No    Lack of Transportation (Non-Medical): No  Physical Activity: Insufficiently Active (09/02/2023)   Exercise Vital Sign    Days of Exercise per Week: 3 days    Minutes of Exercise per Session: 30 min  Stress: No Stress Concern Present (09/02/2023)   [employer] of Occupational Health - Occupational Stress Questionnaire    Feeling of Stress : Not at all  Social Connections: Socially Integrated (09/02/2023)   Social Connection and Isolation Panel [NHANES]    Frequency of Communication with Friends and Family: More than three times a week    Frequency of Social Gatherings with Friends and Family: Three times a week    Attends Religious Services: More than 4 times per year    Active Member of Clubs or Organizations: Yes    Attends Banker Meetings: More than 4 times per year    Marital Status: Married  Catering manager Violence: Not At Risk (09/02/2022)   Humiliation, Afraid, Rape, and Kick questionnaire    Fear of Current or Ex-Partner: No    Emotionally Abused: No    Physically Abused: No    Sexually Abused: No   Family History  Problem Relation Age of Onset   Colon cancer Mother        mets from uterine   Uterine cancer Mother    Heart disease Father        and sister   Diabetes Father    Hearing loss Father    Stroke Father    Heart disease Sister    Lung cancer Sister    Stroke Brother    Migraines Neg Hx      Review of Systems  Cardiovascular:  Positive for chest pain.  All other systems reviewed and are negative.      Objective:   Physical Exam Vitals reviewed.  Constitutional:      General: He is not in acute distress.    Appearance: He is well-developed. He is not  diaphoretic.  HENT:     Head: Normocephalic and atraumatic.     Right Ear: External ear normal.     Left Ear: External ear normal.     Nose: Nose normal.     Mouth/Throat:     Pharynx: No oropharyngeal exudate.  Eyes:     General: No scleral icterus.       Right eye: No discharge.        Left eye: No discharge.     Conjunctiva/sclera: Conjunctivae normal.     Pupils: Pupils are equal, round, and reactive to light.  Neck:     Thyroid: No thyromegaly.     Vascular: No JVD.     Trachea: No tracheal deviation.  Cardiovascular:     Rate and Rhythm: Normal rate and regular rhythm.     Heart sounds: Murmur  heard.     No friction rub. No gallop.  Pulmonary:     Effort: Pulmonary effort is normal. No respiratory distress.     Breath sounds: Normal breath sounds. No stridor. No wheezing or rales.  Chest:     Chest wall: No tenderness.  Abdominal:     General: Bowel sounds are normal. There is no distension.     Palpations: Abdomen is soft. There is no mass.     Tenderness: There is no abdominal tenderness. There is no guarding or rebound.     Hernia: No hernia is present.  Musculoskeletal:        General: No tenderness. Normal range of motion.     Cervical back: Normal range of motion and neck supple.  Lymphadenopathy:     Cervical: No cervical adenopathy.  Skin:    General: Skin is warm.     Capillary Refill: Capillary refill takes less than 2 seconds.     Coloration: Skin is not pale.     Findings: No erythema or rash.  Neurological:     Mental Status: He is alert and oriented to person, place, and time.     Cranial Nerves: No cranial nerve deficit.     Sensory: No sensory deficit.     Motor: No abnormal muscle tone.     Coordination: Coordination normal.     Deep Tendon Reflexes: Reflexes normal.  Psychiatric:        Behavior: Behavior normal.        Thought Content: Thought content normal.        Judgment: Judgment normal.           Assessment & Plan:  Angina  pectoris (HCC) I am concerned that the patient experiencing worsening angina.  Therefore I am concerned that the nonobstructive coronary disease seen 10 years ago has progressed.  I will start the patient on Imdur 30 mg daily.  He is pain-free today.  The remainder of his risk factors are well-controlled.  I will contact his cardiologist and try to arrange outpatient evaluation as soon as possible.  If the chest pain returns I recommended going to his room.

## 2023-09-07 ENCOUNTER — Other Ambulatory Visit: Payer: Self-pay

## 2023-09-07 ENCOUNTER — Other Ambulatory Visit: Payer: Self-pay | Admitting: Family Medicine

## 2023-09-07 ENCOUNTER — Other Ambulatory Visit (HOSPITAL_COMMUNITY): Payer: Self-pay

## 2023-09-07 NOTE — Telephone Encounter (Signed)
 Requested medication (s) are due for refill today - provider review   Requested medication (s) are on the active medication list -yes  Future visit scheduled -no  Last refill: 05/20/23 #21  Notes to clinic: non delegated Rx  Requested Prescriptions  Pending Prescriptions Disp Refills   oxyCODONE-acetaminophen (PERCOCET) 10-325 MG tablet 21 tablet 0    Sig: Take 1 tablet by mouth every 8 (eight) hours as needed for pain.     Not Delegated - Analgesics:  Opioid Agonist Combinations Failed - 09/07/2023  4:22 PM      Failed - This refill cannot be delegated      Failed - Urine Drug Screen completed in last 360 days      Passed - Valid encounter within last 3 months    Recent Outpatient Visits           Yesterday Angina pectoris [facility])   [place] [facility] Donita Brooks, MD   2 months ago Type 2 diabetes mellitus treated without insulin [facility])   [facility] [facility] Donita Brooks, MD   7 months ago Type 2 diabetes mellitus with complication, without long-term current use of insulin [facility])   [place] [facility] Pickard, Priscille Heidelberg, MD   1 year ago Colon cancer screening   [place] [facility] Donita Brooks, MD   1 year ago Type 2 diabetes mellitus with complication, without long-term current use of insulin [facility])   [place] [facility] Pickard, Priscille Heidelberg, MD       Future Appointments             In 4 weeks Lennette Bihari, MD [facility]               Requested Prescriptions  Pending Prescriptions Disp Refills   oxyCODONE-acetaminophen (PERCOCET) 10-325 MG tablet 21 tablet 0    Sig: Take 1 tablet by mouth every 8 (eight) hours as needed for pain.     Not Delegated - Analgesics:  Opioid Agonist Combinations Failed - 09/07/2023  4:22 PM      Failed - This refill cannot be delegated      Failed - Urine Drug Screen completed in last  360 days      Passed - Valid encounter within last 3 months    Recent Outpatient Visits           Yesterday Angina pectoris [facility])   [place] [facility] Donita Brooks, MD   2 months ago Type 2 diabetes mellitus treated without insulin [facility])   [place] [facility] Donita Brooks, MD   7 months ago Type 2 diabetes mellitus with complication, without long-term current use of insulin [facility])   [place] [facility] Pickard, Priscille Heidelberg, MD   1 year ago Colon cancer screening    [place] [facility] Donita Brooks, MD   1 year ago Type 2 diabetes mellitus with complication, without long-term current use of insulin [facility])    [facility] Pickard, Priscille Heidelberg, MD       Future Appointments             In 4 weeks Lennette Bihari, MD [facility]

## 2023-09-08 ENCOUNTER — Other Ambulatory Visit (HOSPITAL_COMMUNITY): Payer: Self-pay

## 2023-09-08 ENCOUNTER — Other Ambulatory Visit: Payer: Self-pay | Admitting: Family Medicine

## 2023-09-08 DIAGNOSIS — I209 Angina pectoris, unspecified: Secondary | ICD-10-CM

## 2023-09-08 DIAGNOSIS — I2510 Atherosclerotic heart disease of native coronary artery without angina pectoris: Secondary | ICD-10-CM

## 2023-09-08 MED ORDER — OXYCODONE-ACETAMINOPHEN 10-325 MG PO TABS
1.0000 | ORAL_TABLET | Freq: Three times a day (TID) | ORAL | 0 refills | Status: DC | PRN
  Filled 2023-09-08: qty 21, 7d supply, fill #0

## 2023-09-09 ENCOUNTER — Encounter: Payer: Self-pay | Admitting: Family Medicine

## 2023-09-12 ENCOUNTER — Other Ambulatory Visit: Payer: Self-pay

## 2023-09-12 ENCOUNTER — Other Ambulatory Visit (HOSPITAL_COMMUNITY): Payer: Self-pay

## 2023-09-14 ENCOUNTER — Encounter: Payer: Self-pay | Admitting: Family Medicine

## 2023-09-14 ENCOUNTER — Telehealth: Payer: Self-pay | Admitting: Cardiovascular Disease

## 2023-09-14 NOTE — Telephone Encounter (Signed)
 Patient wants a provider switch from Dr. Loetta Ringer to Dr. Renna Cary.  Please confirm.

## 2023-09-15 ENCOUNTER — Encounter (HOSPITAL_BASED_OUTPATIENT_CLINIC_OR_DEPARTMENT_OTHER): Payer: Self-pay

## 2023-09-15 ENCOUNTER — Ambulatory Visit (HOSPITAL_BASED_OUTPATIENT_CLINIC_OR_DEPARTMENT_OTHER): Admitting: Cardiology

## 2023-09-15 NOTE — Telephone Encounter (Signed)
 ok

## 2023-09-21 ENCOUNTER — Other Ambulatory Visit (HOSPITAL_COMMUNITY): Payer: Self-pay

## 2023-10-04 NOTE — Progress Notes (Signed)
 Remote pacemaker transmission.

## 2023-10-06 ENCOUNTER — Ambulatory Visit: Payer: Medicare Other | Admitting: Cardiovascular Disease

## 2023-10-18 ENCOUNTER — Encounter: Payer: Self-pay | Admitting: Nurse Practitioner

## 2023-10-18 ENCOUNTER — Ambulatory Visit: Attending: Nurse Practitioner | Admitting: Nurse Practitioner

## 2023-10-18 ENCOUNTER — Other Ambulatory Visit (HOSPITAL_COMMUNITY): Payer: Self-pay

## 2023-10-18 ENCOUNTER — Telehealth: Payer: Self-pay | Admitting: Cardiology

## 2023-10-18 VITALS — BP 110/68 | HR 79 | Ht 68.0 in | Wt 178.0 lb

## 2023-10-18 DIAGNOSIS — I10 Essential (primary) hypertension: Secondary | ICD-10-CM

## 2023-10-18 DIAGNOSIS — I202 Refractory angina pectoris: Secondary | ICD-10-CM

## 2023-10-18 DIAGNOSIS — E785 Hyperlipidemia, unspecified: Secondary | ICD-10-CM

## 2023-10-18 DIAGNOSIS — R079 Chest pain, unspecified: Secondary | ICD-10-CM | POA: Diagnosis not present

## 2023-10-18 DIAGNOSIS — I4510 Unspecified right bundle-branch block: Secondary | ICD-10-CM

## 2023-10-18 DIAGNOSIS — E118 Type 2 diabetes mellitus with unspecified complications: Secondary | ICD-10-CM

## 2023-10-18 DIAGNOSIS — Z950 Presence of cardiac pacemaker: Secondary | ICD-10-CM

## 2023-10-18 DIAGNOSIS — I2510 Atherosclerotic heart disease of native coronary artery without angina pectoris: Secondary | ICD-10-CM | POA: Diagnosis not present

## 2023-10-18 DIAGNOSIS — I480 Paroxysmal atrial fibrillation: Secondary | ICD-10-CM

## 2023-10-18 MED ORDER — RANOLAZINE ER 500 MG PO TB12
500.0000 mg | ORAL_TABLET | Freq: Two times a day (BID) | ORAL | 0 refills | Status: DC
  Filled 2023-10-18: qty 60, 30d supply, fill #0

## 2023-10-18 NOTE — Telephone Encounter (Signed)
 Spoke with pt who denies current CP.  Pt aware of 3pm appointment today with Charles Connor, NP.  Reviewed ED precautions.  Pt verbalizes understanding and agrees with current plan.

## 2023-10-18 NOTE — Patient Instructions (Signed)
 Medication Instructions:  START Ranexa 500mg  Take 1 tablet twice a day  *If you need a refill on your cardiac medications before your next appointment, please call your pharmacy*  Lab Work: TODAY-BNP & TROPININ If you have labs (blood work) drawn today and your tests are completely normal, you will receive your results only by: MyChart Message (if you have MyChart) OR A paper copy in the mail If you have any lab test that is abnormal or we need to change your treatment, we will call you to review the results.  Testing/Procedures: CARDIAC PET STRESS TEST  Follow-Up: At [facility], you and your health needs are our priority.  As part of our continuing mission to provide you with exceptional heart care, our providers are all part of one team.  This team includes your primary Cardiologist (physician) and Advanced Practice Providers or APPs (Physician Assistants and Nurse Practitioners) who all work together to provide you with the care you need, when you need it.  Your next appointment:   2 month(s)  Provider:   Charles Connor, NP or any available APP   We recommend signing up for the patient portal called "MyChart".  Sign up information is provided on this After Visit Summary.  MyChart is used to connect with patients for Virtual Visits (Telemedicine).  Patients are able to view lab/test results, encounter notes, upcoming appointments, etc.  Non-urgent messages can be sent to your provider as well.   To learn more about what you can do with MyChart, go to [email or web address].   Other Instructions     Please report to Radiology at the [facility] Main Entrance 30 minutes early for your test.  [address] [place], [place] [address]                         OR   Please report to Radiology at [facility] Main Entrance, medical mall, 30 mins prior to your test.  [address]  [place], [place]  How to Prepare for Your Cardiac  PET/CT Stress Test:  Nothing to eat or drink, except water, 3 hours prior to arrival time.  NO caffeine/decaffeinated products, or chocolate 12 hours prior to arrival. (Please note decaffeinated beverages (teas/coffees) still contain caffeine).  If you have caffeine within 12 hours prior, the test will need to be rescheduled.  Medication instructions: Do not take erectile dysfunction medications for 72 hours prior to test (sildenafil, tadalafil) Do not take nitrates (isosorbide  mononitrate, Ranexa) the day before or day of test Do not take tamsulosin the day before or morning of test Hold theophylline containing medications for 12 hours. Hold Dipyridamole 48 hours prior to the test.  Diabetic Preparation: If able to eat breakfast prior to 3 hour fasting, you may take all medications, including your insulin . Do not worry if you miss your breakfast dose of insulin  - start at your next meal. If you do not eat prior to 3 hour fast-Hold all diabetes (oral and insulin ) medications. Patients who wear a continuous glucose monitor MUST remove the device prior to scanning.  You may take your remaining medications with water.  NO perfume, cologne or lotion on chest or abdomen area. FEMALES - Please avoid wearing dresses to this appointment.  Total time is 1 to 2 hours; you may want to bring reading material for the waiting time.  IF YOU THINK YOU MAY BE PREGNANT, OR ARE NURSING PLEASE INFORM THE TECHNOLOGIST.  In preparation for your appointment, medication and supplies will be purchased.  Appointment availability is limited, so if you need to cancel or reschedule, please call the Radiology Department Scheduler at [phone] 24 hours in advance to avoid a cancellation fee of $100.00  What to Expect When you Arrive:  Once you arrive and check in for your appointment, you will be taken to a preparation room within the Radiology Department.  A technologist or Nurse will obtain your medical history,  verify that you are correctly prepped for the exam, and explain the procedure.  Afterwards, an IV will be started in your arm and electrodes will be placed on your skin for EKG monitoring during the stress portion of the exam. Then you will be escorted to the PET/CT scanner.  There, staff will get you positioned on the scanner and obtain a blood pressure and EKG.  During the exam, you will continue to be connected to the EKG and blood pressure machines.  A small, safe amount of a radioactive tracer will be injected in your IV to obtain a series of pictures of your heart along with an injection of a stress agent.    After your Exam:  It is recommended that you eat a meal and drink a caffeinated beverage to counter act any effects of the stress agent.  Drink plenty of fluids for the remainder of the day and urinate frequently for the first couple of hours after the exam.  Your doctor will inform you of your test results within 7-10 business days.  For more information and frequently asked questions, please visit our website: [email or web address]  For questions about your test or how to prepare for your test, please call: Cardiac Imaging Nurse Navigators Office: [phone]

## 2023-10-18 NOTE — Telephone Encounter (Signed)
   Pt c/o of Chest Pain: STAT if active CP, including tightness, pressure, jaw pain, radiating pain to shoulder/upper arm/back, CP unrelieved by Nitro. Symptoms reported of SOB, nausea, vomiting, sweating.  1. Are you having CP right now? No    2. Are you experiencing any other symptoms (ex. SOB, nausea, vomiting, sweating)? Some SOB   3. Is your CP continuous or coming and going? Coming and going more frequently w/o exertion   4. Have you taken Nitroglycerin ? Yes with some relief after taking 3   5. How long have you been experiencing CP? 1.5 mo on/off  Scheduled same day appt per spouse request w/ Charles Connor @ 3:10. Pt was not with pt at time of call   6. If NO CP at time of call then end call with telling Pt to call back or call 911 if Chest pain returns prior to return call from triage team.

## 2023-10-18 NOTE — Progress Notes (Signed)
 Cardiology Office Note    Patient Name: Lee [name] Date of Encounter: 10/18/2023  Primary Care Provider:  Austine Lefort, MD Primary Cardiologist:  Magnus Schuller, MD Primary Electrophysiologist: Jolly Needle, MD (Inactive)   Past Medical History    Past Medical History:  Diagnosis Date   Allergic rhinitis    chronic sinusitis   Arthritis    osetoarthritis   CAD (coronary artery disease)    a. 2009 PCI/DES to mLAD; b. 05/2010 s/p DES RCA and LAD.; c. 05/2013 Cath: LAD mild ISR, LCX nl, RCA 40p, patent stents.   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   Cataract    has had lasik surg   COPD (chronic obstructive pulmonary disease) (HCC)    Depression    Diabetes mellitus type 2, controlled, without complications (HCC)    Diabetes mellitus without complication (HCC)    Phreesia 08/22/2020   Diverticulosis    DJD (degenerative joint disease)    Esophagitis 1991   grade 1   GERD (gastroesophageal reflux disease)    Hiatal hernia   Hearing loss    wears bilateral hearing aids   Heart murmur    Dr Cheril Cork dx per patient   Hemorrhoids    Hyperlipidemia    Hypertension    Hypertensive heart disease    Iron deficiency anemia    Migraines    Myocardial infarction [facility])    Paroxysmal atrial fibrillation (HCC)    a. s/p afib ablation 10/22/08 (J. Allred).   Presence of permanent cardiac pacemaker    PVC's (premature ventricular contractions)    Sleep apnea    Uses nightly.  Questionalble: RDI during the total sleep time 6h 28 mins was 3.55/hr during REM sleep was at 5.71/hr. Supine AHI was 5.93/hr.   Special screening for malignant neoplasms, colon 10/01/2022   Syncope 08/2018    History of Present Illness  Lee [name] is a 73 y.o. 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  Lee [name] has been followed for management of CAD and atrial fibrillation.  Was previously followed by Dr. Nathen Balder and is currently followed by Dr. Loetta Ringer.  He was diagnosed with SVT and paroxysmal AF in 2002.  He underwent a LHC in 2009 with stenting of the mid mLAD And 2012 with staged PCI and stenting of the right coronary artery pLAD.  He underwent his last ischemic evaluation by LHC on 2015 that demonstrated patent stents in both sites.  He was admitted with symptomatic implantation of a Saint Jude dual-chamber PPM on 05/02/2019.  He completed a Lexiscan  Myoview  in 2021 that was intermediate risk with large defect of moderate severity throughout the inferior wall with possible correlation of artifact.  He had a 2D echo on 08/03/2023 that showed normal LV function with no wall motion abnormalities apical hypokinesis with mild concentric severe calcification of the AV with mild to moderate aortic stenosis with mean gradient of 12 and peak gradient of 18.9 mmHg.  He was last seen by Dr. Loetta Ringer on 01/21/2023 being asymptomatic complaints of chest pain.  He was found to have stable BP with no changes made to current regimen lipids were managed by PCP.  He  was seen by his PCP on 09/06/2023 with complaint of worsening angina and was started on Imdur  30 mg daily.  He contacted our office on 10/18/2023 with complaint of shortness of breath with chest pain that comes and goes and was relieved with nitroglycerin  x 3.  Reported discomfort lasting over a month and a half.  Lee [name] presents today with his wife for complaint of chest pain.  He has been experiencing worsening angina and shortness of breath since August 06, 2023. Despite using nitroglycerin , taking up to three doses for relief, he continues to experience symptoms. Recently, he was started on isosorbide , which caused severe headaches without clear relief of chest pain. He describes his chest pain as a  pressure sensation, particularly at night when lying down, which is relieved by sitting up. He denies swelling in his ankles and congestion but reports occasional lightheadedness, especially during physical activities like yard work. He experiences shortness of breath and lightheadedness during these activities, requiring him to rest. He is currently on Lasix  and Norvasc  but is not on a beta blocker due to an allergic reaction causing personality changes. His past medical history includes a right bundle branch block and mild to moderate aortic valve stenosis. He reports frequent urination at night, approximately every two hours, and drinks water throughout the night. In the review of symptoms, he denies any cough or congestion but confirms shortness of breath, chest pressure, and lightheadedness. He has no history of allergic reactions to contrast dye used in previous diagnostic tests. Patient denies chest pain, palpitations, dyspnea, PND, orthopnea, nausea, vomiting, dizziness, syncope, edema, weight gain, or early satiety.  Discussed the use of AI scribe software for clinical note transcription with the patient, who gave verbal consent to proceed.  History of Present Illness   Review of Systems  Please see the history of present illness.    All other systems reviewed and are otherwise negative except as noted above.  Physical Exam     Wt Readings from Last 3 Encounters:  09/06/23 182 lb (82.6 kg)  06/27/23 193 lb 3.2 oz (87.6 kg)  06/22/23 194 lb 3.2 oz (88.1 kg)   QM:VHQIO were no vitals filed for this visit.,There is no height or weight on file to calculate BMI. GEN: Well nourished, well developed in no acute distress Neck: No JVD; No carotid bruits Pulmonary: Clear to auscultation without rales, wheezing or rhonchi  Cardiovascular: Normal rate. Regular rhythm. Normal S1. Normal S2.   Murmurs: 2/6 systolic murmur ABDOMEN: Soft, non-tender, non-distended EXTREMITIES:  No edema; No  deformity   EKG/LABS/ Recent Cardiac Studies   ECG personally reviewed by me today -atrial sensed and V paced with rate of 78 bpm and no acute changes consistent with previous EKG.  Risk Assessment/Calculations:          Lab Results  Component Value Date   WBC 7.1 06/22/2023   HGB 15.2 06/22/2023   HCT 45.9 06/22/2023   MCV 90.5 06/22/2023   PLT 266 06/22/2023   Lab Results  Component Value Date   CREATININE 0.72 06/22/2023   BUN 11 06/22/2023   NA 135 06/22/2023   K 4.5 06/22/2023   CL 97 (L) 06/22/2023   CO2 28 06/22/2023   Lab Results  Component Value Date   CHOL 130 06/22/2023   HDL 57 06/22/2023   LDLCALC 60 06/22/2023   TRIG 53 06/22/2023   CHOLHDL 2.3 06/22/2023    Lab Results  Component Value Date   HGBA1C 5.7 (H)  06/22/2023   Assessment & Plan    Assessment & Plan  1.  Chest pain: -Intermittent chest pain relieved by nitroglycerin , with recent worsening. Isosorbide  caused severe headaches. Ranolazine introduced to improve coronary blood flow. PET stress test planned to evaluate myocardial perfusion and rule out microvascular disease. Informed consent obtained with risks explained. - Start ranolazine 500 mg twice daily. - Order PET stress test to evaluate myocardial perfusion. - Order troponin and BNP tests to assess cardiac stress and fluid status.  2.  History of CAD: -s/p previous stenting in 2009 with PCI to LAD, DES to RCA and LAD in 2012 with current complaints of chest pain with and without exertion. -Current angina symptoms warrant further evaluation, but invasive procedures deferred until anti-anginal therapy optimized. - Optimize anti-anginal therapy with ranolazine before considering heart catheterization. - Continue ASA 81 mg, Micardis  40 mg and Crestor  40 mg  3.  Nonrheumatic mild to moderate aortic stenosis: -Symptoms include dyspnea and chest pressure, especially when supine. Monitoring for progression necessary. - Monitor aortic valve  stenosis with serial echocardiograms yearly.  4.  History of second-degree AVB: -s/p Saint Jude PPM implanted 05/2023 second-degree AVB - Stable with current Paceart reports WNL  5.  Essential hypertension: - Patient's blood pressure today was stable at 110/68 - Continue Norvasc  10 mg, Micardis  40 mg  6.  Right bundle branch block: Noted on EKG, consistent with previous findings. No acute changes. No immediate intervention required.  Disposition: Follow-up with Magnus Schuller, MD or APP in 2 months Informed Consent   Shared Decision Making/Informed Consent The risks [chest pain, shortness of breath, cardiac arrhythmias, dizziness, blood pressure fluctuations, myocardial infarction, stroke/transient ischemic attack, nausea, vomiting, allergic reaction, radiation exposure, metallic taste sensation and life-threatening complications (estimated to be 1 in 10,000)], benefits (risk stratification, diagnosing coronary artery disease, treatment guidance) and alternatives of a cardiac PET stress test were discussed in detail with Mr. Kapur and he agrees to proceed.      Signed, Francene Ing, Retha Cast, NP 10/18/2023, 12:24 PM [facility]

## 2023-10-19 ENCOUNTER — Ambulatory Visit: Payer: Self-pay | Admitting: Nurse Practitioner

## 2023-10-19 LAB — PRO B NATRIURETIC PEPTIDE: NT-Pro BNP: 63 pg/mL (ref 0–376)

## 2023-10-19 LAB — TROPONIN T: Troponin T (Highly Sensitive): 7 ng/L (ref 0–22)

## 2023-10-21 ENCOUNTER — Telehealth (HOSPITAL_COMMUNITY): Payer: Self-pay | Admitting: *Deleted

## 2023-10-21 NOTE — Telephone Encounter (Signed)
 Reaching out to patient to offer assistance regarding upcoming cardiac imaging study; pt verbalizes understanding of appt date/time, parking situation and where to check in, pre-test NPO status name and call back number provided for further questions should they arise  Larey Brick RN Navigator Cardiac Imaging [facility] [phone] office [phone] cell  Patient aware to avoid caffeine 12 hours prior to his cardiac PET scan.

## 2023-10-25 ENCOUNTER — Ambulatory Visit (HOSPITAL_COMMUNITY)
Admission: RE | Admit: 2023-10-25 | Discharge: 2023-10-25 | Disposition: A | Discharge status: Home / Self Care | Source: Ambulatory Visit | Attending: Nurse Practitioner | Admitting: Nurse Practitioner

## 2023-10-25 DIAGNOSIS — I480 Paroxysmal atrial fibrillation: Secondary | ICD-10-CM | POA: Diagnosis not present

## 2023-10-25 DIAGNOSIS — I202 Refractory angina pectoris: Secondary | ICD-10-CM

## 2023-10-25 DIAGNOSIS — I10 Essential (primary) hypertension: Secondary | ICD-10-CM

## 2023-10-25 DIAGNOSIS — E118 Type 2 diabetes mellitus with unspecified complications: Secondary | ICD-10-CM | POA: Diagnosis present

## 2023-10-25 DIAGNOSIS — I2510 Atherosclerotic heart disease of native coronary artery without angina pectoris: Secondary | ICD-10-CM

## 2023-10-25 DIAGNOSIS — E785 Hyperlipidemia, unspecified: Secondary | ICD-10-CM

## 2023-10-25 DIAGNOSIS — R079 Chest pain, unspecified: Secondary | ICD-10-CM | POA: Diagnosis not present

## 2023-10-25 DIAGNOSIS — Z950 Presence of cardiac pacemaker: Secondary | ICD-10-CM

## 2023-10-25 DIAGNOSIS — I25112 Atherosclerotic heart disease of native coronary artery with refractory angina pectoris: Secondary | ICD-10-CM | POA: Diagnosis not present

## 2023-10-25 LAB — NM PET CT CARDIAC PERFUSION MULTI W/ABSOLUTE BLOODFLOW
LV dias vol: 91 mL (ref 62–150)
LV sys vol: 35 mL
MBFR: 2.85
Nuc Rest EF: 62 %
Nuc Stress EF: 66 %
Rest MBF: 0.93 ml/g/min
Rest Nuclear Isotope Dose: 21.1 mCi
ST Depression (mm): 0 mm
Stress MBF: 2.65 ml/g/min
Stress Nuclear Isotope Dose: 21 mCi

## 2023-10-25 MED ORDER — DEXTROSE 5 % IV SOLN
INTRAVENOUS | Status: AC
  Filled 2023-10-25: qty 50

## 2023-10-25 MED ORDER — REGADENOSON 0.4 MG/5ML IV SOLN
INTRAVENOUS | Status: AC
  Filled 2023-10-25: qty 5

## 2023-10-25 MED ORDER — REGADENOSON 0.4 MG/5ML IV SOLN
0.4000 mg | Freq: Once | INTRAVENOUS | Status: AC
  Administered 2023-10-25: 0.4 mg via INTRAVENOUS

## 2023-10-25 MED ORDER — RUBIDIUM RB82 GENERATOR (RUBYFILL)
20.9100 | PACK | Freq: Once | INTRAVENOUS | Status: AC
  Administered 2023-10-25: 20.91 via INTRAVENOUS

## 2023-10-25 MED ORDER — CAFFEINE CITRATE BASE COMPONENT 10 MG/ML IV SOLN
INTRAVENOUS | Status: AC
  Filled 2023-10-25: qty 3

## 2023-10-25 MED ORDER — RUBIDIUM RB82 GENERATOR (RUBYFILL)
21.0800 | PACK | Freq: Once | INTRAVENOUS | Status: AC
  Administered 2023-10-25: 21.08 via INTRAVENOUS

## 2023-10-27 ENCOUNTER — Ambulatory Visit (INDEPENDENT_AMBULATORY_CARE_PROVIDER_SITE_OTHER): Admitting: *Deleted

## 2023-10-27 DIAGNOSIS — Z0000 Encounter for general adult medical examination without abnormal findings: Secondary | ICD-10-CM | POA: Diagnosis not present

## 2023-10-27 NOTE — Progress Notes (Signed)
 Subjective:   Lee [name] is a 73 y.o. male who presents for Medicare Annual/Subsequent preventive examination.  Visit Complete: Virtual I connected with  Lee [name] on 10/27/23 by a audio enabled telemedicine application and verified that I am speaking with the correct person using two identifiers.  Patient Location: Home  Provider Location: Home Office  I discussed the limitations of evaluation and management by telemedicine. The patient expressed understanding and agreed to proceed.  Vital Signs: Because this visit was a virtual/telehealth visit, some criteria may be missing or patient reported. Any vitals not documented were not able to be obtained and vitals that have been documented are patient reported. .  Cardiac Risk Factors include: advanced age (>67men, >40 women);diabetes mellitus;male gender;hypertension;family history of premature cardiovascular disease     Objective:     There were no vitals filed for this visit. There is no height or weight on file to calculate BMI.     10/27/2023    1:11 PM 10/01/2022    7:08 AM 09/02/2022    8:39 AM 04/20/2022    8:54 AM 03/03/2022    9:54 AM 02/12/2022    9:49 AM 08/27/2021    8:28 AM  Advanced Directives  Does Patient Have a Medical Advance Directive? Yes Yes Yes Yes Yes Yes Yes  Type of Estate agent of [employer] Power of [place];Living will Living will;Healthcare Power of [employer] Power of [place];Living will  Healthcare Power of [place];Living will Living will  Does patient want to make changes to medical advance directive?   No - Patient declined      Copy of Healthcare Power of Attorney in Chart? Yes - validated most recent copy scanned in chart (See row information) No - copy requested  Yes - validated most recent copy scanned in chart (See row information)  No - copy requested   Would patient like information on creating a medical advance directive?    No - Patient  declined       Current Medications (verified) Outpatient Encounter Medications as of 10/27/2023  Medication Sig   Accu-Chek FastClix Lancets MISC Check blood sugar 2 (two) times daily as directed   acetaminophen  (TYLENOL ) 325 MG tablet Take 1-2 tablets (325-650 mg total) by mouth every 4 (four) hours as needed for mild pain.   amLODipine  (NORVASC ) 10 MG tablet Take 1 tablet (10 mg total) by mouth daily.   aspirin  EC 81 MG tablet Take 81 mg by mouth at bedtime.   Atogepant  (QULIPTA ) 60 MG TABS Take 1 tablet (60 mg total) by mouth daily.   Blood Glucose Monitoring Suppl (ACCU-CHEK AVIVA PLUS) w/Device KIT Check BS bid E11.9   cholecalciferol  (VITAMIN D ) 1000 UNITS tablet Take 1,000 Units by mouth at bedtime.    citalopram  (CELEXA ) 40 MG tablet Take 1 tablet (40 mg total) by mouth daily.   Coenzyme Q10 (CO Q 10) 100 MG CAPS Take 100 mg by mouth daily with breakfast.    empagliflozin  (JARDIANCE ) 25 MG TABS tablet Take 1 tablet (25 mg total) by mouth daily before breakfast.   fexofenadine (ALLEGRA) 180 MG tablet Take 180 mg by mouth daily with breakfast.    fluticasone  (FLONASE ) 50 MCG/ACT nasal spray INSTILL 2 SPRAYS IN THE  NOSE AT BEDTIME   furosemide  (LASIX ) 40 MG tablet Take 1 tablet (40 mg total) by mouth daily.   glucose blood (ACCU-CHEK GUIDE) test strip Use to check blood sugar levels twice a day.   glucose blood  test strip Check blood sugar 2 (two) times daily.   Lancets Misc. (ACCU-CHEK FASTCLIX LANCET) KIT USE AS DIRECTED TO CHECK BLOOD SUGAR TWICE DAILY   metroNIDAZOLE  (METROCREAM ) 0.75 % cream Apply topically 2 (two) times daily to forehead, nose and cheeks   Multiple Vitamin (MULTIVITAMIN WITH MINERALS) TABS tablet Take 1 tablet by mouth daily with breakfast.   nitroGLYCERIN  (NITROSTAT ) 0.4 MG SL tablet PLACE ONE TABLET UNDER THE TONGUE EVERY 5 MINUTES AS NEEDED FOR CHEST PAIN   oxyCODONE -acetaminophen  (PERCOCET) 10-325 MG tablet Take 1 tablet by mouth every 8 (eight) hours as  needed for pain.   pantoprazole  (PROTONIX ) 40 MG tablet Take 1 tablet (40 mg total) by mouth daily.   Polyvinyl Alcohol-Povidone (REFRESH OP) Place 1 drop into both eyes at bedtime as needed (dry eyes).   potassium chloride  (MICRO-K ) 10 MEQ CR capsule Take 1 capsule (10 mEq total) by mouth daily.   ranolazine (RANEXA) 500 MG 12 hr tablet Take 1 tablet (500 mg total) by mouth 2 (two) times daily.   Rimegepant Sulfate  75 MG TBDP Take 1 tablet (75 mg total) by mouth daily as needed for migraine.   rosuvastatin  (CRESTOR ) 40 MG tablet Take 1 tablet (40 mg total) by mouth daily.   telmisartan  (MICARDIS ) 40 MG tablet Take 1 tablet (40 mg total) by mouth daily.   tirzepatide  (MOUNJARO ) 10 MG/0.5ML Pen Inject 10 mg into the skin once a week.   No facility-administered encounter medications on file as of 10/27/2023.    Allergies (verified) Ibuprofen, Isosorbide , Other, Topamax  [topiramate ], Toprol xl [metoprolol succinate], and Metoprolol-hctz er   History: Past Medical History:  Diagnosis Date   Allergic rhinitis    chronic sinusitis   Arthritis    osetoarthritis   CAD (coronary artery disease)    a. 2009 PCI/DES to mLAD; b. 05/2010 s/p DES RCA and LAD.; c. 05/2013 Cath: LAD mild ISR, LCX nl, RCA 40p, patent stents.   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   Cataract    has had lasik surg   COPD (chronic obstructive pulmonary disease) (HCC)    Depression    Diabetes mellitus type 2, controlled, without complications (HCC)    Diabetes mellitus without complication (HCC)    Phreesia 08/22/2020   Diverticulosis    DJD (degenerative joint disease)    Esophagitis 1991   grade 1   GERD (gastroesophageal reflux disease)    Hiatal hernia   Hearing loss    wears bilateral hearing aids   Heart murmur    Dr Cheril Cork dx per patient   Hemorrhoids    Hyperlipidemia    Hypertension    Hypertensive heart disease    Iron deficiency anemia    Migraines    Myocardial infarction  [facility])    Paroxysmal atrial fibrillation (HCC)    a. s/p afib ablation 10/22/08 ([facility]).   Presence of permanent cardiac pacemaker    PVC's (premature ventricular contractions)    Sleep apnea    Uses nightly.  Questionalble: RDI during the total sleep time 6h 28 mins was 3.55/hr during REM sleep was at 5.71/hr. Supine AHI was 5.93/hr.   Special screening for malignant neoplasms, colon 10/01/2022   Syncope 08/2018   Past Surgical History:  Procedure Laterality Date   ABDOMINAL HYSTERECTOMY     CARDIAC CATHETERIZATION  05/31/2010   The proximal LAD was then predilated with 2.0 x 12 trek. this was then stented with a 2.5 x 16 promus Element  drug-eluting stent at 14 atmosphere and prostdilated with 2.75 x 12 [place] trek at 16 atmospheres(2.8 mm) resulting the reduction of 80% proximal LAD stenosis to 0% residual with excellent flow.   CARDIAC CATHETERIZATION  06/05/2010   Successful percutaneous coronary intervention to the right coronary artery with percutaneous transluminal coronary angioplasty/stenting and insertion 3.0 x 16 mm Promus DES post dilated to 3.35 mm with stenosis being reduced to 0%   CARDIAC CATHETERIZATION  02/29/2008   Post dilatation was performed using a 2.75 x 9 [place] sprinter, 10 atmospheres for 40 seconds and then 9 atmosphere for 35 sec. This resulted in the 80% area of narrowing pre-intervention, now appearing to normal. There was no edvidence of the dissection or thrombus and there was TIMI III flow pre and post.   CARDIAC CATHETERIZATION  02/28/2006   COLONOSCOPY WITH PROPOFOL  N/A 10/01/2022   Procedure: COLONOSCOPY WITH PROPOFOL ;  Surgeon: Urban Garden, MD;  [facility]: [facility];  Service: Gastroenterology;  Laterality: N/A;  8:00am;asa 3   CORONARY ANGIOPLASTY WITH STENT PLACEMENT     3 stents/ 4 caths   DISTAL BICEPS TENDON REPAIR Left 02/12/2022   Procedure: LEFT DISTAL BICEPS TENDON REPAIR;  Surgeon: Wes Hamman, MD;  Location: [facility] OR;  Service:  Orthopedics;  Laterality: Left;   DISTAL BICEPS TENDON REPAIR Left 03/03/2022   Procedure: LEFT DISTAL BICEPS TENDON REPAIR;  Surgeon: Wes Hamman, MD;  Location: [facility] OR;  Service: Orthopedics;  Laterality: Left;   EYE SURGERY     FINGER ARTHROPLASTY Right 09/02/2021   Procedure: RIGHT THUMB CARPOMETACARPAL ARTHROPLASTY;  Surgeon: Wes Hamman, MD;  Location: [facility];  Service: Orthopedics;  Laterality: Right;  block in preop   KNEE ARTHROSCOPY     right   LASIK     LEFT HEART CATHETERIZATION WITH CORONARY ANGIOGRAM N/A 06/29/2013   Procedure: LEFT HEART CATHETERIZATION WITH CORONARY ANGIOGRAM;  Surgeon: Arleen Lacer, MD;  Location: [facility] CATH LAB;  Service: Cardiovascular;  Laterality: N/A;   NASAL SINUS SURGERY  06/01/1999   NECK SURGERY     Cervical fusion   PACEMAKER IMPLANT N/A 05/01/2019   Procedure: PACEMAKER IMPLANT;  Surgeon: Jolly Needle, MD;  Location: [facility] INVASIVE CV LAB;  Service: Cardiovascular;  Laterality: N/A;   RADIOFREQUENCY ABLATION  09/28/2008   atrial fibrillation   ROTATOR CUFF REPAIR     left   SHOULDER OPEN ROTATOR CUFF REPAIR     right   SPINE SURGERY     WRIST ARTHROCENTESIS     left   Family History  Problem Relation Age of Onset   Colon cancer Mother        mets from uterine   Uterine cancer Mother    Heart disease Father        and sister   Diabetes Father    Hearing loss Father    Stroke Father    Heart disease Sister    Lung cancer Sister    Stroke Brother    Migraines Neg Hx    Social History   Socioeconomic History   Marital status: Married    Spouse name: Adell Hones   Number of children: 2   Years of education: Not on file   Highest education level: 12th grade  Occupational History   Occupation: retired  Tobacco Use   Smoking status: Former    Current packs/day: 0.00    Average packs/day: 2.0 packs/day for 25.0 years (50.0 ttl pk-yrs)    Types: Cigarettes, Cigars  Start date: 04/23/1963    Quit date: 04/22/1988     Years since quitting: 35.5    Passive exposure: Past   Smokeless tobacco: Never  Vaping Use   Vaping status: Never Used  Substance and Sexual Activity   Alcohol use: Not Currently   Drug use: No   Sexual activity: Not Currently    Birth control/protection: Surgical    Comment: vas  Other Topics Concern   Not on file  Social History Narrative   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   Currently works part time for [employer].   2 daughters.    Married x 43 years 09/2021.   Caffiene:  1 cup daily, 2 diet drinks daily. (Caffiene free most time).    Social Drivers of Corporate investment banker Strain: Low Risk  (10/27/2023)   Overall Financial Resource Strain (CARDIA)    Difficulty of Paying Living Expenses: Not hard at all  Food Insecurity: No Food Insecurity (10/27/2023)   Hunger Vital Sign    Worried About Running Out of Food in the Last Year: Never true    Ran Out of Food in the Last Year: Never true  Transportation Needs: No Transportation Needs (10/27/2023)   PRAPARE - Administrator, Civil Service (Medical): No    Lack of Transportation (Non-Medical): No  Physical Activity: Insufficiently Active (10/27/2023)   Exercise Vital Sign    Days of Exercise per Week: 2 days    Minutes of Exercise per Session: 30 min  Stress: No Stress Concern Present (10/27/2023)   [employer] of Occupational Health - Occupational Stress Questionnaire    Feeling of Stress : Not at all  Social Connections: Moderately Integrated (10/27/2023)   Social Connection and Isolation Panel [NHANES]    Frequency of Communication with Friends and Family: More than three times a week    Frequency of Social Gatherings with Friends and Family: Once a week    Attends Religious Services: More than 4 times per year    Active Member of [employer] or Organizations: No    Attends Engineer, structural: Never    Marital Status: Married    Tobacco Counseling Counseling  given: Not Answered   Clinical Intake:  Pre-visit preparation completed: Yes  Pain : No/denies pain     Diabetes: Yes CBG done?: No Did pt. bring in CBG monitor from home?: No  How often do you need to have someone help you when you read instructions, pamphlets, or other written materials from your doctor or pharmacy?: 1 - Never  Interpreter Needed?: No  Information entered by :: Kieth Pelt LPN   Activities of Daily Living    10/27/2023    1:12 PM  In your present state of health, do you have any difficulty performing the following activities:  Hearing? 1  Vision? 0  Difficulty concentrating or making decisions? 0  Walking or climbing stairs? 0  Dressing or bathing? 0  Doing errands, shopping? 0  Preparing Food and eating ? N  Using the Toilet? N  In the past six months, have you accidently leaked urine? N  Do you have problems with loss of bowel control? N  Managing your Medications? N  Managing your Finances? N  Housekeeping or managing your Housekeeping? N    Patient Care Team: Austine Lefort, MD as PCP - General (Family Medicine) Millicent Ally, MD as PCP - Cardiology (Cardiology) Jolly Needle, MD (Inactive) as PCP -  Electrophysiology (Cardiology) [place], Georgean Kindle, DPM as Consulting Physician (Podiatry) [facility], [facility]  Indicate any recent Medical Services you may have received from other than [facility] providers in the past year (date may be approximate).     Assessment:    This is a routine wellness examination for Paden.  Hearing/Vision screen Hearing Screening - Comments:: Bilateral hearing aids Vision Screening - Comments:: Up to date [facility]   Goals Addressed             This Visit's Progress    Exercise 3x per week (30 min per time)   On track    Continue to exercise and stay healthy. Weight loss goal of 190.      Patient Stated       Stay healthy       Depression Screen    10/27/2023    1:18 PM 06/27/2023   12:07  PM 02/07/2023    9:04 AM 09/02/2022    9:05 AM 06/25/2022    8:35 AM 03/29/2022    9:56 AM 08/27/2021    8:20 AM  PHQ 2/9 Scores  PHQ - 2 Score 0 0 0 0 0 0 0  PHQ- 9 Score 2          Fall Risk    10/27/2023    1:09 PM 06/27/2023   12:07 PM 02/07/2023    9:03 AM 09/02/2022    9:04 AM 08/29/2022    1:06 PM  Fall Risk   Falls in the past year? 0 0 0 0 0  Number falls in past yr: 0 0  0   Injury with Fall? 0 0  0   Risk for fall due to :  No Fall Risks  No Fall Risks   Follow up Falls evaluation completed;Education provided;Falls prevention discussed Falls prevention discussed  Falls prevention discussed;Education provided;Falls evaluation completed     MEDICARE RISK AT HOME: Medicare Risk at Home Any stairs in or around the home?: Yes If so, are there any without handrails?: No Home free of loose throw rugs in walkways, pet beds, electrical cords, etc?: Yes Adequate lighting in your home to reduce risk of falls?: Yes Life alert?: No Use of a cane, walker or w/c?: No Grab bars in the bathroom?: Yes Shower chair or bench in shower?: Yes Elevated toilet seat or a handicapped toilet?: No  TIMED UP AND GO:  Was the test performed?  No    Cognitive Function:        10/27/2023    1:10 PM 09/02/2022    9:06 AM 08/27/2021    8:35 AM  6CIT Screen  What Year? 0 points 0 points 0 points  What month? 0 points 0 points 0 points  What time? 0 points 0 points 0 points  Count back from 20 0 points 0 points 0 points  Months in reverse 0 points 0 points 0 points  Repeat phrase 8 points 0 points 2 points  Total Score 8 points 0 points 2 points    Immunizations Immunization History  Administered Date(s) Administered   Fluad Quad(high Dose 65+) 03/29/2022   Influenza Split 03/04/2013, 02/28/2014, 03/01/2015, 02/26/2016   Influenza, Seasonal, Injecte, Preservative Fre 03/04/2013, 02/28/2014   Influenza,inj,Quad PF,6+ Mos 03/04/2013, 02/28/2014   Influenza,trivalent, recombinat, inj, PF  03/01/2015, 02/26/2016   Influenza-Unspecified 03/04/2013, 02/28/2014, 03/01/2015, 02/26/2016, 03/14/2021   Moderna Sars-Covid-2 Vaccination 07/14/2019, 08/10/2019   Pneumococcal Conjugate Pcv21, Polysaccharide Crm197 Conjugaf 06/27/2023   Pneumococcal Conjugate-13 05/29/2015   Pneumococcal Polysaccharide-23  06/14/2004, 09/17/2016   Pneumococcal-Unspecified 06/14/2004, 09/17/2016   Tdap 03/30/2012, 12/07/2018   Zoster Recombinant(Shingrix) 12/30/2020, 04/18/2021   Zoster, Live 03/30/2012    TDAP status: Up to date  Flu Vaccine status: Up to date  Pneumococcal vaccine status: Up to date  Covid-19 vaccine status: Information provided on how to obtain vaccines.   Qualifies for Shingles Vaccine? No   Zostavax completed Yes   Shingrix Completed?: Yes  Screening Tests Health Maintenance  Topic Date Due   Diabetic kidney evaluation - Urine ACR  Never done   COVID-19 Vaccine (3 - Moderna risk series) 09/07/2019   HEMOGLOBIN A1C  12/20/2023   INFLUENZA VACCINE  12/30/2023   OPHTHALMOLOGY EXAM  02/24/2024   Diabetic kidney evaluation - eGFR measurement  06/21/2024   FOOT EXAM  06/26/2024   Medicare Annual Wellness (AWV)  10/26/2024   DTaP/Tdap/Td (3 - Td or Tdap) 12/06/2028   Colonoscopy  09/30/2032   Pneumonia Vaccine 52+ Years old  Completed   Zoster Vaccines- Shingrix  Completed   HPV VACCINES  Aged Out   Meningococcal B Vaccine  Aged Out   Hepatitis C Screening  Discontinued    Health Maintenance  Health Maintenance Due  Topic Date Due   Diabetic kidney evaluation - Urine ACR  Never done   COVID-19 Vaccine (3 - Moderna risk series) 09/07/2019    Colorectal cancer screening: No longer required.   Lung Cancer Screening: (Low Dose CT Chest recommended if Age 96-80 years, 20 pack-year currently smoking OR have quit w/in 15years.) does not qualify.   Lung Cancer Screening Referral:   Additional Screening:  Hepatitis C Screening: does not qualify; Completed never  done  Vision Screening: Recommended annual ophthalmology exams for early detection of glaucoma and other disorders of the eye. Is the patient up to date with their annual eye exam?  Yes  Who is the provider or what is the name of the office in which the patient attends annual eye exams? [facility] If pt is not established with a provider, would they like to be referred to a provider to establish care? No .   Dental Screening: Recommended annual dental exams for proper oral hygiene  Nutrition Risk Assessment:  Has the patient had any N/V/D within the last 2 months?  No  Does the patient have any non-healing wounds?  No  Has the patient had any unintentional weight loss or weight gain?  No   Diabetes:  Is the patient diabetic?  Yes  If diabetic, was a CBG obtained today?  No  Did the patient bring in their glucometer from home?  No  How often do you monitor your CBG's? Every day.   Financial Strains and Diabetes Management:  Are you having any financial strains with the device, your supplies or your medication? No .  Does the patient want to be seen by Chronic Care Management for management of their diabetes?  No  Would the patient like to be referred to a Nutritionist or for Diabetic Management?  No   Diabetic Exams:  Diabetic Eye Exam: Completed .Pt has been advised about the importance in completing this exam.   Diabetic Foot Exam: . Pt has been advised about the importance in completing this exam.    Community Resource Referral / Chronic Care Management: CRR required this visit?  No   CCM required this visit?  No     Plan:     I have personally reviewed and noted the following in the patient's chart:  Medical and social history Use of alcohol, tobacco or illicit drugs  Current medications and supplements including opioid prescriptions. Patient is not currently taking opioid prescriptions. Functional ability and status Nutritional status Physical activity Advanced  directives List of other physicians Hospitalizations, surgeries, and ER visits in previous 12 months Vitals Screenings to include cognitive, depression, and falls Referrals and appointments  In addition, I have reviewed and discussed with patient certain preventive protocols, quality metrics, and best practice recommendations. A written personalized care plan for preventive services as well as general preventive health recommendations were provided to patient.     Kieth Pelt, LPN   9/56/2130   After Visit Summary: (MyChart) Due to this being a telephonic visit, the after visit summary with patients personalized plan was offered to patient via MyChart   Nurse Notes:

## 2023-10-27 NOTE — Patient Instructions (Signed)
 Lee [name] , Thank you for taking time to come for your Medicare Wellness Visit. I appreciate your ongoing commitment to your health goals. Please review the following plan we discussed and let me know if I can assist you in the future.   Screening recommendations/referrals: Colonoscopy: up to date Recommended yearly ophthalmology/optometry visit for glaucoma screening and checkup Recommended yearly dental visit for hygiene and checkup  Vaccinations: Influenza vaccine: up to date Pneumococcal vaccine: up to date Tdap vaccine: up to date Shingles vaccine: up to date      Preventive Care 65 Years and Older, Male Preventive care refers to lifestyle choices and visits with your health care provider that can promote health and wellness. What does preventive care include? A yearly physical exam. This is also called an annual well check. Dental exams once or twice a year. Routine eye exams. Ask your health care provider how often you should have your eyes checked. Personal lifestyle choices, including: Daily care of your teeth and gums. Regular physical activity. Eating a healthy diet. Avoiding tobacco and drug use. Limiting alcohol use. Practicing safe sex. Taking low doses of aspirin  every day. Taking vitamin and mineral supplements as recommended by your health care provider. What happens during an annual well check? The services and screenings done by your health care provider during your annual well check will depend on your age, overall health, lifestyle risk factors, and family history of disease. Counseling  Your health care provider may ask you questions about your: Alcohol use. Tobacco use. Drug use. Emotional well-being. Home and relationship well-being. Sexual activity. Eating habits. History of falls. Memory and ability to understand (cognition). Work and work Astronomer. Screening  You may have the following tests or measurements: Height, weight, and  BMI. Blood pressure. Lipid and cholesterol levels. These may be checked every 5 years, or more frequently if you are over 86 years old. Skin check. Lung cancer screening. You may have this screening every year starting at age 59 if you have a 30-pack-year history of smoking and currently smoke or have quit within the past 15 years. Fecal occult blood test (FOBT) of the stool. You may have this test every year starting at age 73. Flexible sigmoidoscopy or colonoscopy. You may have a sigmoidoscopy every 5 years or a colonoscopy every 10 years starting at age 64. Prostate cancer screening. Recommendations will vary depending on your family history and other risks. Hepatitis C blood test. Hepatitis B blood test. Sexually transmitted disease (STD) testing. Diabetes screening. This is done by checking your blood sugar (glucose) after you have not eaten for a while (fasting). You may have this done every 1-3 years. Abdominal aortic aneurysm (AAA) screening. You may need this if you are a current or former smoker. Osteoporosis. You may be screened starting at age 43 if you are at high risk. Talk with your health care provider about your test results, treatment options, and if necessary, the need for more tests. Vaccines  Your health care provider may recommend certain vaccines, such as: Influenza vaccine. This is recommended every year. Tetanus, diphtheria, and acellular pertussis (Tdap, Td) vaccine. You may need a Td booster every 10 years. Zoster vaccine. You may need this after age 66. Pneumococcal 13-valent conjugate (PCV13) vaccine. One dose is recommended after age 37. Pneumococcal polysaccharide (PPSV23) vaccine. One dose is recommended after age 85. Talk to your health care provider about which screenings and vaccines you need and how often you need them. This information is not intended  to replace advice given to you by your health care provider. Make sure you discuss any questions you have  with your health care provider. Document Released: 06/13/2015 Document Revised: 02/04/2016 Document Reviewed: 03/18/2015 Elsevier Interactive Patient Education  2017 [employer].  Fall Prevention in the Home Falls can cause injuries. They can happen to people of all ages. There are many things you can do to make your home safe and to help prevent falls. What can I do on the outside of my home? Regularly fix the edges of walkways and driveways and fix any cracks. Remove anything that might make you trip as you walk through a door, such as a raised step or threshold. Trim any bushes or trees on the path to your home. Use bright outdoor lighting. Clear any walking paths of anything that might make someone trip, such as rocks or tools. Regularly check to see if handrails are loose or broken. Make sure that both sides of any steps have handrails. Any raised decks and porches should have guardrails on the edges. Have any leaves, snow, or ice cleared regularly. Use sand or salt on walking paths during winter. Clean up any spills in your garage right away. This includes oil or grease spills. What can I do in the bathroom? Use night lights. Install grab bars by the toilet and in the tub and shower. Do not use towel bars as grab bars. Use non-skid mats or decals in the tub or shower. If you need to sit down in the shower, use a plastic, non-slip stool. Keep the floor dry. Clean up any water that spills on the floor as soon as it happens. Remove soap buildup in the tub or shower regularly. Attach bath mats securely with double-sided non-slip rug tape. Do not have throw rugs and other things on the floor that can make you trip. What can I do in the bedroom? Use night lights. Make sure that you have a light by your bed that is easy to reach. Do not use any sheets or blankets that are too big for your bed. They should not hang down onto the floor. Have a firm chair that has side arms. You can use  this for support while you get dressed. Do not have throw rugs and other things on the floor that can make you trip. What can I do in the kitchen? Clean up any spills right away. Avoid walking on wet floors. Keep items that you use a lot in easy-to-reach places. If you need to reach something above you, use a strong step stool that has a grab bar. Keep electrical cords out of the way. Do not use floor polish or wax that makes floors slippery. If you must use wax, use non-skid floor wax. Do not have throw rugs and other things on the floor that can make you trip. What can I do with my stairs? Do not leave any items on the stairs. Make sure that there are handrails on both sides of the stairs and use them. Fix handrails that are broken or loose. Make sure that handrails are as long as the stairways. Check any carpeting to make sure that it is firmly attached to the stairs. Fix any carpet that is loose or worn. Avoid having throw rugs at the top or bottom of the stairs. If you do have throw rugs, attach them to the floor with carpet tape. Make sure that you have a light switch at the top of the stairs and  the bottom of the stairs. If you do not have them, ask someone to add them for you. What else can I do to help prevent falls? Wear shoes that: Do not have high heels. Have rubber bottoms. Are comfortable and fit you well. Are closed at the toe. Do not wear sandals. If you use a stepladder: Make sure that it is fully opened. Do not climb a closed stepladder. Make sure that both sides of the stepladder are locked into place. Ask someone to hold it for you, if possible. Clearly mark and make sure that you can see: Any grab bars or handrails. First and last steps. Where the edge of each step is. Use tools that help you move around (mobility aids) if they are needed. These include: Canes. Walkers. Scooters. Crutches. Turn on the lights when you go into a dark area. Replace any light bulbs  as soon as they burn out. Set up your furniture so you have a clear path. Avoid moving your furniture around. If any of your floors are uneven, fix them. If there are any pets around you, be aware of where they are. Review your medicines with your doctor. Some medicines can make you feel dizzy. This can increase your chance of falling. Ask your doctor what other things that you can do to help prevent falls. This information is not intended to replace advice given to you by your health care provider. Make sure you discuss any questions you have with your health care provider. Document Released: 03/13/2009 Document Revised: 10/23/2015 Document Reviewed: 06/21/2014 Elsevier Interactive Patient Education  2017 [employer].

## 2023-11-07 ENCOUNTER — Other Ambulatory Visit (HOSPITAL_COMMUNITY): Payer: Self-pay

## 2023-11-07 ENCOUNTER — Ambulatory Visit: Admitting: Family Medicine

## 2023-11-07 ENCOUNTER — Encounter: Payer: Self-pay | Admitting: Family Medicine

## 2023-11-07 VITALS — BP 122/78 | HR 90 | Temp 97.9°F | Ht 68.0 in | Wt 179.0 lb

## 2023-11-07 DIAGNOSIS — H6993 Unspecified Eustachian tube disorder, bilateral: Secondary | ICD-10-CM | POA: Diagnosis not present

## 2023-11-07 MED ORDER — FLUTICASONE PROPIONATE 50 MCG/ACT NA SUSP
2.0000 | Freq: Every day | NASAL | 6 refills | Status: DC
  Filled 2023-11-07: qty 16, 30d supply, fill #0
  Filled 2023-12-06: qty 16, 30d supply, fill #1
  Filled 2024-01-02: qty 16, 30d supply, fill #2
  Filled 2024-02-09: qty 16, 30d supply, fill #3
  Filled 2024-03-09: qty 16, 30d supply, fill #4
  Filled 2024-04-11: qty 16, 30d supply, fill #5
  Filled 2024-05-06: qty 16, 30d supply, fill #6

## 2023-11-07 NOTE — Progress Notes (Signed)
 Subjective:    Patient ID: Lee [name], male    DOB: [birth year], 73 y.o.   MRN: [id number]  Patient reports that both ears feel stopped up, left greater than right.  They have been like that for months.  He denies any tinnitus.  He denies any hearing loss.  He denies any vertigo.  On examination today tympanic membranes are retracted but there is no erythema.  There is no effusion.  There is no wax or cerumen impaction Past Medical History:  Diagnosis Date   Allergic rhinitis    chronic sinusitis   Arthritis    osetoarthritis   CAD (coronary artery disease)    a. 2009 PCI/DES to mLAD; b. 05/2010 s/p DES RCA and LAD.; c. 05/2013 Cath: LAD mild ISR, LCX nl, RCA 40p, patent stents.   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   Cataract    has had lasik surg   COPD (chronic obstructive pulmonary disease) (HCC)    Depression    Diabetes mellitus type 2, controlled, without complications (HCC)    Diabetes mellitus without complication (HCC)    Phreesia 08/22/2020   Diverticulosis    DJD (degenerative joint disease)    Esophagitis 1991   grade 1   GERD (gastroesophageal reflux disease)    Hiatal hernia   Hearing loss    wears bilateral hearing aids   Heart murmur    Dr Cheril Cork dx per patient   Hemorrhoids    Hyperlipidemia    Hypertension    Hypertensive heart disease    Iron deficiency anemia    Migraines    Myocardial infarction [facility])    Paroxysmal atrial fibrillation (HCC)    a. s/p afib ablation 10/22/08 (J. Allred).   Presence of permanent cardiac pacemaker    PVC's (premature ventricular contractions)    Sleep apnea    Uses nightly.  Questionalble: RDI during the total sleep time 6h 28 mins was 3.55/hr during REM sleep was at 5.71/hr. Supine AHI was 5.93/hr.   Special screening for malignant neoplasms, colon 10/01/2022   Syncope 08/2018   Past Surgical History:  Procedure Laterality Date   ABDOMINAL HYSTERECTOMY     CARDIAC CATHETERIZATION   05/31/2010   The proximal LAD was then predilated with 2.0 x 12 trek. this was then stented with a 2.5 x 16 promus Element drug-eluting stent at 14 atmosphere and prostdilated with 2.75 x 12 [place] trek at 16 atmospheres(2.8 mm) resulting the reduction of 80% proximal LAD stenosis to 0% residual with excellent flow.   CARDIAC CATHETERIZATION  06/05/2010   Successful percutaneous coronary intervention to the right coronary artery with percutaneous transluminal coronary angioplasty/stenting and insertion 3.0 x 16 mm Promus DES post dilated to 3.35 mm with stenosis being reduced to 0%   CARDIAC CATHETERIZATION  02/29/2008   Post dilatation was performed using a 2.75 x 9 [place] sprinter, 10 atmospheres for 40 seconds and then 9 atmosphere for 35 sec. This resulted in the 80% area of narrowing pre-intervention, now appearing to normal. There was no edvidence of the dissection or thrombus and there was TIMI III flow pre and post.   CARDIAC CATHETERIZATION  02/28/2006   COLONOSCOPY WITH PROPOFOL  N/A 10/01/2022   Procedure: COLONOSCOPY WITH PROPOFOL ;  Surgeon: Urban Garden, MD;  Location: [facility];  Service: Gastroenterology;  Laterality: N/A;  8:00am;asa 3   CORONARY ANGIOPLASTY WITH STENT PLACEMENT     3 stents/ 4 caths   DISTAL  BICEPS TENDON REPAIR Left 02/12/2022   Procedure: LEFT DISTAL BICEPS TENDON REPAIR;  Surgeon: Wes Hamman, MD;  Location: [facility] OR;  Service: Orthopedics;  Laterality: Left;   DISTAL BICEPS TENDON REPAIR Left 03/03/2022   Procedure: LEFT DISTAL BICEPS TENDON REPAIR;  Surgeon: Wes Hamman, MD;  Location: [facility] OR;  Service: Orthopedics;  Laterality: Left;   EYE SURGERY     FINGER ARTHROPLASTY Right 09/02/2021   Procedure: RIGHT THUMB CARPOMETACARPAL ARTHROPLASTY;  Surgeon: Wes Hamman, MD;  Location: [facility];  Service: Orthopedics;  Laterality: Right;  block in preop   KNEE ARTHROSCOPY     right   LASIK     LEFT HEART CATHETERIZATION WITH CORONARY  ANGIOGRAM N/A 06/29/2013   Procedure: LEFT HEART CATHETERIZATION WITH CORONARY ANGIOGRAM;  Surgeon: Arleen Lacer, MD;  Location: [facility] CATH LAB;  Service: Cardiovascular;  Laterality: N/A;   NASAL SINUS SURGERY  06/01/1999   NECK SURGERY     Cervical fusion   PACEMAKER IMPLANT N/A 05/01/2019   Procedure: PACEMAKER IMPLANT;  Surgeon: Jolly Needle, MD;  Location: [facility] INVASIVE CV LAB;  Service: Cardiovascular;  Laterality: N/A;   RADIOFREQUENCY ABLATION  09/28/2008   atrial fibrillation   ROTATOR CUFF REPAIR     left   SHOULDER OPEN ROTATOR CUFF REPAIR     right   SPINE SURGERY     WRIST ARTHROCENTESIS     left   Current Outpatient Medications on File Prior to Visit  Medication Sig Dispense Refill   Accu-Chek FastClix Lancets MISC Check blood sugar 2 (two) times daily as directed 102 each 5   acetaminophen  (TYLENOL ) 325 MG tablet Take 1-2 tablets (325-650 mg total) by mouth every 4 (four) hours as needed for mild pain.     amLODipine  (NORVASC ) 10 MG tablet Take 1 tablet (10 mg total) by mouth daily. 90 tablet 1   aspirin  EC 81 MG tablet Take 81 mg by mouth at bedtime.     Atogepant  (QULIPTA ) 60 MG TABS Take 1 tablet (60 mg total) by mouth daily. 90 tablet 3   Blood Glucose Monitoring Suppl (ACCU-CHEK AVIVA PLUS) w/Device KIT Check BS bid E11.9 1 kit 1   cholecalciferol  (VITAMIN D ) 1000 UNITS tablet Take 1,000 Units by mouth at bedtime.      citalopram  (CELEXA ) 40 MG tablet Take 1 tablet (40 mg total) by mouth daily. 90 tablet 1   Coenzyme Q10 (CO Q 10) 100 MG CAPS Take 100 mg by mouth daily with breakfast.      empagliflozin  (JARDIANCE ) 25 MG TABS tablet Take 1 tablet (25 mg total) by mouth daily before breakfast. 90 tablet 3   fexofenadine (ALLEGRA) 180 MG tablet Take 180 mg by mouth daily with breakfast.      fluticasone  (FLONASE ) 50 MCG/ACT nasal spray INSTILL 2 SPRAYS IN THE  NOSE AT BEDTIME 48 g 3   furosemide  (LASIX ) 40 MG tablet Take 1 tablet (40 mg total) by mouth daily. 90 tablet  3   glucose blood (ACCU-CHEK GUIDE) test strip Use to check blood sugar levels twice a day. 200 strip 2   glucose blood test strip Check blood sugar 2 (two) times daily. 100 each 12   Lancets Misc. (ACCU-CHEK FASTCLIX LANCET) KIT USE AS DIRECTED TO CHECK BLOOD SUGAR TWICE DAILY 1 kit 1   metroNIDAZOLE  (METROCREAM ) 0.75 % cream Apply topically 2 (two) times daily to forehead, nose and cheeks 45 g 3   Multiple Vitamin (MULTIVITAMIN WITH MINERALS) TABS tablet  Take 1 tablet by mouth daily with breakfast.     nitroGLYCERIN  (NITROSTAT ) 0.4 MG SL tablet PLACE ONE TABLET UNDER THE TONGUE EVERY 5 MINUTES AS NEEDED FOR CHEST PAIN 25 tablet 3   oxyCODONE -acetaminophen  (PERCOCET) 10-325 MG tablet Take 1 tablet by mouth every 8 (eight) hours as needed for pain. 21 tablet 0   pantoprazole  (PROTONIX ) 40 MG tablet Take 1 tablet (40 mg total) by mouth daily. 90 tablet 1   Polyvinyl Alcohol-Povidone (REFRESH OP) Place 1 drop into both eyes at bedtime as needed (dry eyes).     potassium chloride  (MICRO-K ) 10 MEQ CR capsule Take 1 capsule (10 mEq total) by mouth daily. 90 capsule 1   ranolazine  (RANEXA ) 500 MG 12 hr tablet Take 1 tablet (500 mg total) by mouth 2 (two) times daily. 60 tablet 0   Rimegepant Sulfate  75 MG TBDP Take 1 tablet (75 mg total) by mouth daily as needed for migraine. 16 tablet 11   rosuvastatin  (CRESTOR ) 40 MG tablet Take 1 tablet (40 mg total) by mouth daily. 90 tablet 1   telmisartan  (MICARDIS ) 40 MG tablet Take 1 tablet (40 mg total) by mouth daily. 90 tablet 3   tirzepatide  (MOUNJARO ) 10 MG/0.5ML Pen Inject 10 mg into the skin once a week. 6 mL 11   No current facility-administered medications on file prior to visit.   Allergies  Allergen Reactions   Ibuprofen Swelling    Whole body swelled   Isosorbide  Other (See Comments)    migraines   Other Shortness Of Breath    BETA BLOCKERS   Topamax  [Topiramate ] Other (See Comments)    Pt states it made his tongue feel "scalded" and he  couldn't eat    Toprol Xl [Metoprolol Succinate] Other (See Comments)    Personality change   Metoprolol-Hctz Er Other (See Comments)    Indigestion,mouth raw   Social History   Socioeconomic History   Marital status: Married    Spouse name: Adell Hones   Number of children: 2   Years of education: Not on file   Highest education level: 12th grade  Occupational History   Occupation: retired  Tobacco Use   Smoking status: Former    Current packs/day: 0.00    Average packs/day: 2.0 packs/day for 25.0 years (50.0 ttl pk-yrs)    Types: Cigarettes, Cigars    Start date: 04/23/1963    Quit date: 04/22/1988    Years since quitting: 35.5    Passive exposure: Past   Smokeless tobacco: Never  Vaping Use   Vaping status: Never Used  Substance and Sexual Activity   Alcohol use: Not Currently   Drug use: No   Sexual activity: Not Currently    Birth control/protection: Surgical    Comment: vas  Other Topics Concern   Not on file  Social History Narrative   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   Currently works part time for [employer].   2 daughters.    Married x 43 years 09/2021.   Caffiene:  1 cup daily, 2 diet drinks daily. (Caffiene free most time).    Social Drivers of Corporate investment banker Strain: Low Risk  (10/27/2023)   Overall Financial Resource Strain (CARDIA)    Difficulty of Paying Living Expenses: Not hard at all  Food Insecurity: No Food Insecurity (10/27/2023)   Hunger Vital Sign    Worried About Running Out of Food in the Last Year: Never true    Ran Out  of Food in the Last Year: Never true  Transportation Needs: No Transportation Needs (10/27/2023)   PRAPARE - Administrator, Civil Service (Medical): No    Lack of Transportation (Non-Medical): No  Physical Activity: Insufficiently Active (10/27/2023)   Exercise Vital Sign    Days of Exercise per Week: 2 days    Minutes of Exercise per Session: 30 min  Stress: No Stress  Concern Present (10/27/2023)   [employer] of Occupational Health - Occupational Stress Questionnaire    Feeling of Stress : Not at all  Social Connections: Moderately Integrated (10/27/2023)   Social Connection and Isolation Panel [NHANES]    Frequency of Communication with Friends and Family: More than three times a week    Frequency of Social Gatherings with Friends and Family: Once a week    Attends Religious Services: More than 4 times per year    Active Member of [employer] or Organizations: No    Attends Banker Meetings: Never    Marital Status: Married  Catering manager Violence: Not At Risk (10/27/2023)   Humiliation, Afraid, Rape, and Kick questionnaire    Fear of Current or Ex-Partner: No    Emotionally Abused: No    Physically Abused: No    Sexually Abused: No   Family History  Problem Relation Age of Onset   Colon cancer Mother        mets from uterine   Uterine cancer Mother    Heart disease Father        and sister   Diabetes Father    Hearing loss Father    Stroke Father    Heart disease Sister    Lung cancer Sister    Stroke Brother    Migraines Neg Hx      Review of Systems  Cardiovascular:  Positive for chest pain.  All other systems reviewed and are negative.      Objective:   Physical Exam Vitals reviewed.  Constitutional:      General: He is not in acute distress.    Appearance: He is well-developed. He is not diaphoretic.  HENT:     Head: Normocephalic and atraumatic.     Right Ear: Ear canal and external ear normal. Tympanic membrane is retracted.     Left Ear: Ear canal and external ear normal. Tympanic membrane is retracted.     Nose: Nose normal.     Mouth/Throat:     Pharynx: No oropharyngeal exudate.  Eyes:     General: No scleral icterus.       Right eye: No discharge.        Left eye: No discharge.     Conjunctiva/sclera: Conjunctivae normal.     Pupils: Pupils are equal, round, and reactive to light.  Neck:      Thyroid : No thyromegaly.     Vascular: No JVD.     Trachea: No tracheal deviation.  Cardiovascular:     Rate and Rhythm: Normal rate and regular rhythm.     Heart sounds: Murmur heard.     No friction rub. No gallop.  Pulmonary:     Effort: Pulmonary effort is normal. No respiratory distress.     Breath sounds: Normal breath sounds. No stridor. No wheezing or rales.  Chest:     Chest wall: No tenderness.  Abdominal:     General: Bowel sounds are normal. There is no distension.     Palpations: Abdomen is soft. There is no mass.  Tenderness: There is no abdominal tenderness. There is no guarding or rebound.     Hernia: No hernia is present.  Musculoskeletal:        General: No tenderness. Normal range of motion.     Cervical back: Normal range of motion and neck supple.  Lymphadenopathy:     Cervical: No cervical adenopathy.  Skin:    General: Skin is warm.     Capillary Refill: Capillary refill takes less than 2 seconds.     Coloration: Skin is not pale.     Findings: No erythema or rash.  Neurological:     Mental Status: He is alert and oriented to person, place, and time.     Cranial Nerves: No cranial nerve deficit.     Sensory: No sensory deficit.     Motor: No abnormal muscle tone.     Coordination: Coordination normal.     Deep Tendon Reflexes: Reflexes normal.  Psychiatric:        Behavior: Behavior normal.        Thought Content: Thought content normal.        Judgment: Judgment normal.           Assessment & Plan:  Dysfunction of both eustachian tubes - Plan: Ambulatory referral to ENT Believe the patient has eustachian tube dysfunction bilaterally.  Begin Flonase  2 sprays each nostril daily.  If not improving after 3 to 4 weeks I would like the patient to see an ENT physician to discuss possible tympanostomy tube placement.

## 2023-11-10 ENCOUNTER — Other Ambulatory Visit: Payer: Self-pay | Admitting: Nurse Practitioner

## 2023-11-10 ENCOUNTER — Other Ambulatory Visit: Payer: Self-pay

## 2023-11-11 ENCOUNTER — Other Ambulatory Visit (HOSPITAL_COMMUNITY): Payer: Self-pay

## 2023-11-11 MED ORDER — RANOLAZINE ER 500 MG PO TB12
500.0000 mg | ORAL_TABLET | Freq: Two times a day (BID) | ORAL | 0 refills | Status: DC
  Filled 2023-11-11: qty 180, 90d supply, fill #0

## 2023-11-15 ENCOUNTER — Encounter: Payer: Self-pay | Admitting: Family Medicine

## 2023-11-15 ENCOUNTER — Other Ambulatory Visit (HOSPITAL_COMMUNITY): Payer: Self-pay

## 2023-11-15 ENCOUNTER — Other Ambulatory Visit: Payer: Self-pay | Admitting: Family Medicine

## 2023-11-15 MED ORDER — PANTOPRAZOLE SODIUM 40 MG PO TBEC
40.0000 mg | DELAYED_RELEASE_TABLET | Freq: Two times a day (BID) | ORAL | 1 refills | Status: DC
  Filled 2023-11-15: qty 180, 90d supply, fill #0
  Filled 2024-02-09: qty 180, 90d supply, fill #1

## 2023-11-25 ENCOUNTER — Ambulatory Visit (INDEPENDENT_AMBULATORY_CARE_PROVIDER_SITE_OTHER): Payer: Medicare Other

## 2023-11-25 DIAGNOSIS — I442 Atrioventricular block, complete: Secondary | ICD-10-CM

## 2023-11-28 ENCOUNTER — Other Ambulatory Visit: Payer: Self-pay | Admitting: Cardiovascular Disease

## 2023-11-28 ENCOUNTER — Ambulatory Visit: Payer: Self-pay | Admitting: Cardiology

## 2023-11-28 LAB — CUP PACEART REMOTE DEVICE CHECK
Battery Remaining Longevity: 52 mo
Battery Remaining Percentage: 52 %
Battery Voltage: 2.98 V
Brady Statistic AP VP Percent: 1 %
Brady Statistic AP VS Percent: 1 %
Brady Statistic AS VP Percent: 99 %
Brady Statistic AS VS Percent: 1 %
Brady Statistic RA Percent Paced: 1 %
Brady Statistic RV Percent Paced: 99 %
Date Time Interrogation Session: 20250627020013
Implantable Lead Connection Status: 753985
Implantable Lead Connection Status: 753985
Implantable Lead Implant Date: 20201201
Implantable Lead Implant Date: 20201201
Implantable Lead Location: 753859
Implantable Lead Location: 753860
Implantable Pulse Generator Implant Date: 20201201
Lead Channel Impedance Value: 440 Ohm
Lead Channel Impedance Value: 460 Ohm
Lead Channel Pacing Threshold Amplitude: 0.75 V
Lead Channel Pacing Threshold Amplitude: 1 V
Lead Channel Pacing Threshold Pulse Width: 0.5 ms
Lead Channel Pacing Threshold Pulse Width: 0.5 ms
Lead Channel Sensing Intrinsic Amplitude: 3.1 mV
Lead Channel Sensing Intrinsic Amplitude: 3.4 mV
Lead Channel Setting Pacing Amplitude: 2 V
Lead Channel Setting Pacing Amplitude: 2.5 V
Lead Channel Setting Pacing Pulse Width: 0.5 ms
Lead Channel Setting Sensing Sensitivity: 2 mV
Pulse Gen Model: 2272
Pulse Gen Serial Number: 9182765

## 2023-11-29 ENCOUNTER — Other Ambulatory Visit (HOSPITAL_COMMUNITY): Payer: Self-pay

## 2023-11-29 MED ORDER — TELMISARTAN 40 MG PO TABS
40.0000 mg | ORAL_TABLET | Freq: Every day | ORAL | 2 refills | Status: AC
  Filled 2023-11-29: qty 90, 90d supply, fill #0
  Filled 2024-02-29: qty 90, 90d supply, fill #1
  Filled 2024-05-29: qty 90, 90d supply, fill #2

## 2023-11-29 MED ORDER — ROSUVASTATIN CALCIUM 40 MG PO TABS
40.0000 mg | ORAL_TABLET | Freq: Every day | ORAL | 2 refills | Status: AC
  Filled 2023-11-29: qty 90, 90d supply, fill #0
  Filled 2024-02-29: qty 90, 90d supply, fill #1
  Filled 2024-05-29: qty 90, 90d supply, fill #2

## 2023-12-06 ENCOUNTER — Other Ambulatory Visit: Payer: Self-pay | Admitting: Cardiovascular Disease

## 2023-12-06 ENCOUNTER — Other Ambulatory Visit: Payer: Self-pay

## 2023-12-08 ENCOUNTER — Other Ambulatory Visit (HOSPITAL_COMMUNITY): Payer: Self-pay

## 2023-12-08 ENCOUNTER — Other Ambulatory Visit: Payer: Self-pay

## 2023-12-08 MED ORDER — FUROSEMIDE 40 MG PO TABS
40.0000 mg | ORAL_TABLET | Freq: Every day | ORAL | 3 refills | Status: AC
  Filled 2023-12-08: qty 90, 90d supply, fill #0
  Filled 2024-02-29: qty 90, 90d supply, fill #1
  Filled 2024-05-29: qty 90, 90d supply, fill #2
  Filled 2024-07-04: qty 90, 90d supply, fill #3

## 2023-12-08 MED ORDER — POTASSIUM CHLORIDE ER 10 MEQ PO CPCR
10.0000 meq | ORAL_CAPSULE | Freq: Every day | ORAL | 3 refills | Status: AC
  Filled 2023-12-08: qty 90, 90d supply, fill #0
  Filled 2024-02-29: qty 90, 90d supply, fill #1
  Filled 2024-05-29: qty 90, 90d supply, fill #2
  Filled 2024-07-04: qty 90, 90d supply, fill #3

## 2023-12-19 ENCOUNTER — Other Ambulatory Visit (HOSPITAL_COMMUNITY): Payer: Self-pay

## 2023-12-19 ENCOUNTER — Encounter: Payer: Self-pay | Admitting: Nurse Practitioner

## 2023-12-19 ENCOUNTER — Ambulatory Visit: Attending: Nurse Practitioner | Admitting: Nurse Practitioner

## 2023-12-19 VITALS — BP 106/72 | HR 93 | Ht 68.0 in | Wt 172.0 lb

## 2023-12-19 DIAGNOSIS — I350 Nonrheumatic aortic (valve) stenosis: Secondary | ICD-10-CM

## 2023-12-19 DIAGNOSIS — Z950 Presence of cardiac pacemaker: Secondary | ICD-10-CM | POA: Diagnosis not present

## 2023-12-19 DIAGNOSIS — I25118 Atherosclerotic heart disease of native coronary artery with other forms of angina pectoris: Secondary | ICD-10-CM

## 2023-12-19 DIAGNOSIS — I441 Atrioventricular block, second degree: Secondary | ICD-10-CM | POA: Diagnosis not present

## 2023-12-19 DIAGNOSIS — E118 Type 2 diabetes mellitus with unspecified complications: Secondary | ICD-10-CM

## 2023-12-19 DIAGNOSIS — E785 Hyperlipidemia, unspecified: Secondary | ICD-10-CM

## 2023-12-19 DIAGNOSIS — I10 Essential (primary) hypertension: Secondary | ICD-10-CM

## 2023-12-19 DIAGNOSIS — I442 Atrioventricular block, complete: Secondary | ICD-10-CM

## 2023-12-19 MED ORDER — AMLODIPINE BESYLATE 5 MG PO TABS
5.0000 mg | ORAL_TABLET | Freq: Every day | ORAL | 3 refills | Status: DC
Start: 2023-12-19 — End: 2024-01-31
  Filled 2023-12-19: qty 90, 90d supply, fill #0

## 2023-12-19 NOTE — Patient Instructions (Addendum)
 Medication Instructions:  Decrease Amlodipine  5 mg daily  *If you need a refill on your cardiac medications before your next appointment, please call your pharmacy*  Lab Work: NONE ordered at this time of appointment   Testing/Procedures: NONE ordered at this time of appointment   Follow-Up: At [facility], you and your health needs are our priority.  As part of our continuing mission to provide you with exceptional heart care, our providers are all part of one team.  This team includes your primary Cardiologist (physician) and Advanced Practice Providers or APPs (Physician Assistants and Nurse Practitioners) who all work together to provide you with the care you need, when you need it.  Your next appointment:    Keep apt with Dr. Jeffrie 1st available with Dr. Inocencio (EP)  Provider:   Dr. Jeffrie & Dr. Inocencio    We recommend signing up for the patient portal called MyChart.  Sign up information is provided on this After Visit Summary.  MyChart is used to connect with patients for Virtual Visits (Telemedicine).  Patients are able to view lab/test results, encounter notes, upcoming appointments, etc.  Non-urgent messages can be sent to your provider as well.   To learn more about what you can do with MyChart, go to [email or web address].   Other Instructions Monitor blood pressure. Report BP consistently greater than 130/80.

## 2023-12-19 NOTE — Progress Notes (Signed)
 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  Past Medical History    Past Medical History:  Diagnosis Date   Allergic rhinitis    chronic sinusitis   Allergy    Arrhythmia    Arthritis    osetoarthritis   CAD (coronary artery disease)    a. 2009 PCI/DES to mLAD; b. 05/2010 s/p DES RCA and LAD.; c. 05/2013 Cath: LAD mild ISR, LCX nl, RCA 40p, patent stents.   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   Cataract    has had lasik surg   COPD (chronic obstructive pulmonary disease) (HCC)    Depression    Diabetes mellitus type 2, controlled, without complications (HCC)    Diabetes mellitus without complication (HCC)    Phreesia 08/22/2020   Diverticulosis    DJD (degenerative joint disease)    Esophagitis 1991   grade 1   GERD (gastroesophageal reflux disease)    Hiatal hernia   Hearing loss    wears bilateral hearing aids   Heart murmur    Dr Duanne dx per patient   Hemorrhoids    Hyperlipidemia    Hypertension    Hypertensive heart disease    Iron deficiency anemia    Migraines    Myocardial infarction [facility])    Paroxysmal atrial fibrillation (HCC)    a. s/p afib ablation 10/22/08 (J. [facility]red).   Presence of permanent cardiac pacemaker    PVC's (premature ventricular contractions)    Sleep apnea    Uses nightly.  Questionalble: RDI during the total sleep time 6h 28 mins was 3.55/hr during REM sleep was at 5.71/hr. Supine AHI was 5.93/hr.   Special screening for malignant neoplasms, colon 10/01/2022   Syncope 08/2018   Past Surgical History:  Procedure  Laterality Date   ABDOMINAL HYSTERECTOMY     CARDIAC CATHETERIZATION  05/31/2010   The proximal LAD was then predilated with 2.0 x 12 trek. this was then stented with a 2.5 x 16 promus Element drug-eluting stent at 14 atmosphere and prostdilated with 2.75 x 12 Cascade trek at 16 atmospheres(2.8 mm) resulting the reduction of 80% proximal LAD stenosis to 0% residual with excellent flow.   CARDIAC CATHETERIZATION  06/05/2010   Successful percutaneous coronary intervention to the right coronary artery with percutaneous transluminal coronary angioplasty/stenting and insertion 3.0 x 16 mm Promus DES post dilated to 3.35 mm with stenosis being reduced to 0%   CARDIAC CATHETERIZATION  02/29/2008   Post dilatation was performed using a 2.75 x 9 [place] sprinter, 10 atmospheres for 40 seconds and then 9 atmosphere for 35 sec. This resulted in the 80% area of narrowing pre-intervention, now appearing to normal. There was no edvidence of the dissection or thrombus and there was TIMI III flow pre and post.   CARDIAC CATHETERIZATION  02/28/2006   COLONOSCOPY WITH PROPOFOL  N/A 10/01/2022   Procedure: COLONOSCOPY WITH PROPOFOL ;  Surgeon: Eartha Angelia Sieving, MD;  Location: [facility];  Service: Gastroenterology;  Laterality: N/A;  8:00am;asa 3   CORONARY ANGIOPLASTY WITH STENT PLACEMENT     3 stents/  4 caths   DISTAL BICEPS TENDON REPAIR Left 02/12/2022   Procedure: LEFT DISTAL BICEPS TENDON REPAIR;  Surgeon: Jerri Kay HERO, MD;  Location: [facility] OR;  Service: Orthopedics;  Laterality: Left;   DISTAL BICEPS TENDON REPAIR Left 03/03/2022   Procedure: LEFT DISTAL BICEPS TENDON REPAIR;  Surgeon: Jerri Kay HERO, MD;  Location: [facility] OR;  Service: Orthopedics;  Laterality: Left;   EYE SURGERY     FINGER ARTHROPLASTY Right 09/02/2021   Procedure: RIGHT THUMB CARPOMETACARPAL ARTHROPLASTY;  Surgeon: Jerri Kay HERO, MD;  Location: [facility];  Service: Orthopedics;  Laterality: Right;  block in preop   INSERT /  REPLACE / REMOVE PACEMAKER  05/01/2019   KNEE ARTHROSCOPY     right   LASIK     LEFT HEART CATHETERIZATION WITH CORONARY ANGIOGRAM N/A 06/29/2013   Procedure: LEFT HEART CATHETERIZATION WITH CORONARY ANGIOGRAM;  Surgeon: Alm LELON Clay, MD;  Location: [facility] CATH LAB;  Service: Cardiovascular;  Laterality: N/A;   NASAL SINUS SURGERY  06/01/1999   NECK SURGERY     Cervical fusion   PACEMAKER IMPLANT N/A 05/01/2019   Procedure: PACEMAKER IMPLANT;  Surgeon: Kelsie Agent, MD;  Location: [facility] INVASIVE CV LAB;  Service: Cardiovascular;  Laterality: N/A;   RADIOFREQUENCY ABLATION  09/28/2008   atrial fibrillation   ROTATOR CUFF REPAIR     left   SHOULDER OPEN ROTATOR CUFF REPAIR     right   SPINE SURGERY     WRIST ARTHROCENTESIS     left    Allergies  Allergies  Allergen Reactions   Ibuprofen Swelling    Whole body swelled   Isosorbide  Other (See Comments)    migraines   Other Shortness Of Breath    BETA BLOCKERS   Topamax  [Topiramate ] Other (See Comments)    Pt states it made his tongue feel scalded and he couldn't eat    Toprol Xl [Metoprolol Succinate] Other (See Comments)    Personality change   Metoprolol-Hctz Er Other (See Comments)    Indigestion,mouth raw     Labs/Other Studies Reviewed    The following studies were reviewed today:  Cardiac Studies & Procedures   ______________________________________________________________________________________________   STRESS TESTS  NM PET CT CARDIAC PERFUSION MULTI W/ABSOLUTE BLOODFLOW 10/25/2023  Narrative   LV perfusion is normal. There is no evidence of ischemia. There is no evidence of infarction.   Rest left ventricular function is normal. Rest EF: 62%. Stress left ventricular function is normal. Stress EF: 66%. End diastolic cavity size is normal. End systolic cavity size is normal.   Myocardial blood flow was computed to be 0.30ml/g/min at rest and 2.65ml/g/min at stress. Global myocardial blood flow reserve was  2.85 and was normal.   Coronary calcium  assessment not performed due to prior revascularization. Prior stenting of RCA and LAD   The study is normal. The study is low risk.  EXAM: OVER-READ INTERPRETATION  PET-CT CHEST  The following report is an over-read performed by radiologist Dr. Camellia Lang [facility] on 10/25/2023. This over-read does not include interpretation of cardiac or coronary anatomy or pathology. The cardiac PET and cardiac CT interpretation by the cardiologist is to be attached.  COMPARISON:  None.  FINDINGS: No evidence for lymphadenopathy within the visualized mediastinum or hilar regions.  The visualized lung parenchyma shows no suspicious pulmonary nodule or mass. No focal airspace consolidation. No effusion.  Visualized portions of the upper abdomen are unremarkable.  No suspicious lytic or sclerotic osseous abnormality.  IMPRESSION:  No acute or clinically significant extracardiac findings.   Electronically Signed By: Camellia Candle M.D. On: 10/25/2023 08:17   ECHOCARDIOGRAM  ECHOCARDIOGRAM COMPLETE 08/03/2023  Narrative ECHOCARDIOGRAM REPORT    Patient Name:   ZAYLEN SUSMAN Date of Exam: 08/03/2023 Medical Rec #:  [id number]         Height:       68.0 in Accession #:    [id number]        Weight:       193.2 lb Date of Birth:  15-Apr-1951         BSA:          2.014 m Patient Age:    72 years          BP:           122/68 mmHg Patient Gender: M                 HR:           87 bpm. Exam Location:  [address]  Procedure: 2D Echo (Both Spectral and Color Flow Doppler were utilized during procedure).  Indications:    I35.0 Nonrheumatic aortic (valve) stenosis  History:        Patient has prior history of Echocardiogram examinations, most recent 09/03/2022. CAD and Previous Myocardial Infarction, Pacemaker, COPD, Arrythmias:RBBB, Atrial Fibrillation and PVC, Signs/Symptoms:Syncope; Risk Factors:Former Smoker, Hypertension,  Diabetes and Dyslipidemia. Palpitations.  Sonographer:    Jon Hacker RCS Referring Phys: [phone] THOMAS A KELLY  IMPRESSIONS   1. Left ventricular ejection fraction, by estimation, is 55%. The left ventricle has normal function. The left ventricle demonstrates regional wall motion abnormalities with septal-lateral dyssynchrony consistent with RV pacing and also apical hypokinesis. There is mild concentric left ventricular hypertrophy. Left ventricular diastolic parameters are consistent with Grade I diastolic dysfunction (impaired relaxation). 2. Right ventricular systolic function is normal. The right ventricular size is normal. There is normal pulmonary artery systolic pressure. The estimated right ventricular systolic pressure is 16.7 mmHg. 3. The mitral valve is normal in structure. No evidence of mitral valve regurgitation. No evidence of mitral stenosis. 4. The aortic valve is tricuspid. There is severe calcifcation of the aortic valve. Aortic valve regurgitation is not visualized. Mild to moderate aortic valve stenosis. Aortic valve area, by VTI measures 1.30 cm. Aortic valve mean gradient measures 12.0 mmHg. 5. The inferior vena cava is normal in size with greater than 50% respiratory variability, suggesting right atrial pressure of 3 mmHg.  FINDINGS Left Ventricle: Left ventricular ejection fraction, by estimation, is 55%. The left ventricle has normal function. The left ventricle demonstrates regional wall motion abnormalities. The left ventricular internal cavity size was normal in size. There is mild concentric left ventricular hypertrophy. Left ventricular diastolic parameters are consistent with Grade I diastolic dysfunction (impaired relaxation).  Right Ventricle: The right ventricular size is normal. No increase in right ventricular wall thickness. Right ventricular systolic function is normal. There is normal pulmonary artery systolic pressure. The tricuspid regurgitant  velocity is 1.85 m/s, and with an assumed right atrial pressure of 3 mmHg, the estimated right ventricular systolic pressure is 16.7 mmHg.  Left Atrium: Left atrial size was normal in size.  Right Atrium: Right atrial size was normal in size.  Pericardium: There is no evidence of pericardial effusion.  Mitral Valve: The mitral valve is normal in structure. No evidence of mitral valve regurgitation. No evidence of mitral valve stenosis.  Tricuspid Valve: The tricuspid valve is normal in structure.  Tricuspid valve regurgitation is trivial.  Aortic Valve: The aortic valve is tricuspid. There is severe calcifcation of the aortic valve. Aortic valve regurgitation is not visualized. Mild to moderate aortic stenosis is present. Aortic valve mean gradient measures 12.0 mmHg. Aortic valve peak gradient measures 18.9 mmHg. Aortic valve area, by VTI measures 1.30 cm.  Pulmonic Valve: The pulmonic valve was normal in structure. Pulmonic valve regurgitation is not visualized.  Aorta: The aortic root is normal in size and structure.  Venous: The inferior vena cava is normal in size with greater than 50% respiratory variability, suggesting right atrial pressure of 3 mmHg.  IAS/Shunts: No atrial level shunt detected by color flow Doppler.  Additional Comments: A device lead is visualized in the right ventricle.   LEFT VENTRICLE PLAX 2D LVIDd:         4.20 cm   Diastology LVIDs:         2.30 cm   LV e' medial:  8.96 cm/s LV PW:         1.10 cm   LV e' lateral: 8.01 cm/s LV IVS:        1.20 cm LVOT diam:     2.10 cm LV SV:         50 LV SV Index:   25 LVOT Area:     3.46 cm   RIGHT VENTRICLE RV Basal diam:  2.80 cm RV S prime:     10.23 cm/s TAPSE (M-mode): 0.8 cm RVSP:           16.7 mmHg  LEFT ATRIUM             Index        RIGHT ATRIUM           Index LA diam:        4.00 cm 1.99 cm/m   RA Pressure: 3.00 mmHg LA Vol (A2C):   48.8 ml 24.23 ml/m  RA Area:     16.30 cm LA Vol  (A4C):   42.5 ml 21.10 ml/m  RA Volume:   35.70 ml  17.73 ml/m LA Biplane Vol: 49.3 ml 24.48 ml/m AORTIC VALVE AV Area (Vmax):    1.30 cm AV Area (Vmean):   1.33 cm AV Area (VTI):     1.30 cm AV Vmax:           217.60 cm/s AV Vmean:          146.800 cm/s AV VTI:            0.386 m AV Peak Grad:      18.9 mmHg AV Mean Grad:      12.0 mmHg LVOT Vmax:         81.57 cm/s LVOT Vmean:        56.433 cm/s LVOT VTI:          0.145 m LVOT/AV VTI ratio: 0.38  AORTA Ao Root diam: 3.30 cm Ao Asc diam:  3.30 cm  TRICUSPID VALVE TR Peak grad:   13.7 mmHg TR Vmax:        185.00 cm/s Estimated RAP:  3.00 mmHg RVSP:           16.7 mmHg  SHUNTS Systemic VTI:  0.14 m Systemic Diam: 2.10 cm  Dalton McleanMD Electronically signed by Ezra Kanner Signature Date/Time: 08/03/2023/10:10:59 AM    Final    MONITORS  CARDIAC EVENT MONITOR 10/10/2018       ______________________________________________________________________________________________     Recent Labs: 06/22/2023: ALT 18; BUN  11; Creat 0.72; Hemoglobin 15.2; Platelets 266; Potassium 4.5; Sodium 135 10/18/2023: NT-Pro BNP 63  Recent Lipid Panel    Component Value Date/Time   CHOL 130 06/22/2023 0801   CHOL 155 11/21/2012 1025   TRIG 53 06/22/2023 0801   TRIG 81 11/21/2012 1025   HDL 57 06/22/2023 0801   HDL 45 11/21/2012 1025   CHOLHDL 2.3 06/22/2023 0801   VLDL 10 09/13/2016 0822   LDLCALC 60 06/22/2023 0801   LDLCALC 94 11/21/2012 1025    History of Present Illness    73 year old male with the above past medical history including CAD s/p DES-LAD in 2009, DES-RCA, LAD in 2012, 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   He underwent stenting to his LAD in 2009.  In 2012 he underwent staged intervention with stenting of his proximal RCA as well as stenting to a new site in his proximal LAD.  Repeat cardiac  catheterization in 2015 showed no significant restenosis of his LAD stents with mild 20% narrowing in the proximal stent and 40% mid stent.  Left circumflex was normal, RCA had 40% narrowing just proximal to a widely patent stent in the mid vessel.  Nitrate therapy was added to his medical regimen, however this was ultimately discontinued due to headaches.  Myocardial perfusion study in 08/2015 was normal, EF 62%.  Echocardiogram in 2020 showed EF 60 to 65%, G2 DD.  He was hospitalized in May 2020 with recurrent syncope and was found to have significant pauses on cardiac monitor, complete heart block, second-degree AV block Mobitz type II s/p PPM in 05/2019. Most recent echocardiogram in 07/2023 showed EF 55%, normal LV function, RWMA with septal lateral dyssynchrony consistent with RV pacing, apical hypokinesis, mild concentric LVH, G1 DD, normal RV systolic function, mild to moderate aortic stenosis, mean gradient 12 mmHg.  He was last seen in the office on 10/18/2023 and reported intermittent chest pain relieved by nitroglycerin .  He was started on Ranexa .  Cardiac PET stress test in 09/2023 showed no evidence of ischemia or infarction, EF 62%.  Remote device check in 10/2023 showed normal device function, presenting rhythm A-sensed, V-paced.  He presents today for follow-up accompanied by his wife.  Since his last visit he has been stable from a cardiac standpoint.  He does note decreased frequency of chest pain since starting Ranexa , he continues to note intermittent fleeting chest discomfort, lasting less than 30 seconds at a time.  He also reports ongoing mild shortness of breath with activity, he fatigues easily, he reports mild orthostatic dizziness, intermittent palpitations.  Despite this, he remains active, he works in his yard regularly.  He denies presyncope, syncope, edema, PND, orthopnea, weight gain. In fact, he has lost over 80 pounds on Mounjaro .  He has not been checking his BP at home.  Home  Medications    Current Outpatient Medications  Medication Sig Dispense Refill   Accu-Chek FastClix Lancets MISC Check blood sugar 2 (two) times daily as directed 102 each 5   acetaminophen  (TYLENOL ) 325 MG tablet Take 1-2 tablets (325-650 mg total) by mouth every 4 (four) hours as needed for mild pain.     amLODipine  (NORVASC ) 5 MG tablet Take 1 tablet (5 mg total) by mouth daily. 90 tablet 3   aspirin  EC 81 MG tablet Take 81 mg by mouth at bedtime.     Atogepant  (QULIPTA ) 60 MG TABS Take 1 tablet (60 mg total) by mouth  daily. 90 tablet 3   Blood Glucose Monitoring Suppl (ACCU-CHEK AVIVA PLUS) w/Device KIT Check BS bid E11.9 1 kit 1   cholecalciferol  (VITAMIN D ) 1000 UNITS tablet Take 1,000 Units by mouth at bedtime.      citalopram  (CELEXA ) 40 MG tablet Take 1 tablet (40 mg total) by mouth daily. 90 tablet 1   Coenzyme Q10 (CO Q 10) 100 MG CAPS Take 100 mg by mouth daily with breakfast.      empagliflozin  (JARDIANCE ) 25 MG TABS tablet Take 1 tablet (25 mg total) by mouth daily before breakfast. 90 tablet 3   fexofenadine (ALLEGRA) 180 MG tablet Take 180 mg by mouth daily with breakfast.      fluticasone  (FLONASE ) 50 MCG/ACT nasal spray INSTILL 2 SPRAYS IN THE  NOSE AT BEDTIME 48 g 3   fluticasone  (FLONASE ) 50 MCG/ACT nasal spray Place 2 sprays into both nostrils daily. 16 g 6   furosemide  (LASIX ) 40 MG tablet Take 1 tablet (40 mg total) by mouth daily. 90 tablet 3   glucose blood test strip Check blood sugar 2 (two) times daily. 100 each 12   Lancets Misc. (ACCU-CHEK FASTCLIX LANCET) KIT USE AS DIRECTED TO CHECK BLOOD SUGAR TWICE DAILY 1 kit 1   metroNIDAZOLE  (METROCREAM ) 0.75 % cream Apply topically 2 (two) times daily to forehead, nose and cheeks 45 g 3   Multiple Vitamin (MULTIVITAMIN WITH MINERALS) TABS tablet Take 1 tablet by mouth daily with breakfast.     nitroGLYCERIN  (NITROSTAT ) 0.4 MG SL tablet PLACE ONE TABLET UNDER THE TONGUE EVERY 5 MINUTES AS NEEDED FOR CHEST PAIN 25 tablet 3    oxyCODONE -acetaminophen  (PERCOCET) 10-325 MG tablet Take 1 tablet by mouth every 8 (eight) hours as needed for pain. 21 tablet 0   pantoprazole  (PROTONIX ) 40 MG tablet Take 1 tablet (40 mg total) by mouth 2 (two) times daily. 180 tablet 1   Polyvinyl Alcohol-Povidone (REFRESH OP) Place 1 drop into both eyes at bedtime as needed (dry eyes).     potassium chloride  (MICRO-K ) 10 MEQ CR capsule Take 1 capsule (10 mEq total) by mouth daily. 90 capsule 3   ranolazine  (RANEXA ) 500 MG 12 hr tablet Take 1 tablet (500 mg total) by mouth 2 (two) times daily. 180 tablet 0   Rimegepant Sulfate  75 MG TBDP Take 1 tablet (75 mg total) by mouth daily as needed for migraine. 16 tablet 11   rosuvastatin  (CRESTOR ) 40 MG tablet Take 1 tablet (40 mg total) by mouth daily. 90 tablet 2   telmisartan  (MICARDIS ) 40 MG tablet Take 1 tablet (40 mg total) by mouth daily. 90 tablet 2   tirzepatide  (MOUNJARO ) 10 MG/0.5ML Pen Inject 10 mg into the skin once a week. 6 mL 11   No current facility-administered medications for this visit.     Review of Systems    He denies pnd, orthopnea, n, v, syncope, edema, weight gain, or early satiety. All other systems reviewed and are otherwise negative except as noted above.   Physical Exam    VS:  BP 106/72 (BP Location: Right Arm, Patient Position: Sitting)   Pulse 93   Ht 5' 8 (1.727 m)   Wt 172 lb (78 kg)   SpO2 97%   BMI 26.15 kg/m   GEN: Well nourished, well developed, in no acute distress. HEENT: normal. Neck: Supple, no JVD, carotid bruits, or masses. Cardiac: RRR, no murmurs, rubs, or gallops. No clubbing, cyanosis, edema.  Radials/DP/PT 2+ and equal bilaterally.  Respiratory:  Respirations  regular and unlabored, clear to auscultation bilaterally. GI: Soft, nontender, nondistended, BS + x 4. MS: no deformity or atrophy. Skin: warm and dry, no rash. Neuro:  Strength and sensation are intact. Psych: Normal affect.  Accessory Clinical Findings    ECG personally  reviewed by me today - EKG Interpretation Date/Time:  Monday December 19 2023 09:12:58 EDT Ventricular Rate:  93 PR Interval:  230 QRS Duration:  170 QT Interval:  420 QTC Calculation: 522 R Axis:   -86  Text Interpretation: Atrial-sensed ventricular-paced rhythm with prolonged AV conduction When compared with ECG of 18-Oct-2023 15:07, Vent. rate has increased BY  15 BPM Confirmed by Daneen Perkins ([id number]) on 12/19/2023 9:32:40 AM  - no acute changes.   Lab Results  Component Value Date   WBC 7.1 06/22/2023   HGB 15.2 06/22/2023   HCT 45.9 06/22/2023   MCV 90.5 06/22/2023   PLT 266 06/22/2023   Lab Results  Component Value Date   CREATININE 0.72 06/22/2023   BUN 11 06/22/2023   NA 135 06/22/2023   K 4.5 06/22/2023   CL 97 (L) 06/22/2023   CO2 28 06/22/2023   Lab Results  Component Value Date   ALT 18 06/22/2023   AST 17 06/22/2023   ALKPHOS 71 05/01/2019   BILITOT 0.5 06/22/2023   Lab Results  Component Value Date   CHOL 130 06/22/2023   HDL 57 06/22/2023   LDLCALC 60 06/22/2023   TRIG 53 06/22/2023   CHOLHDL 2.3 06/22/2023    Lab Results  Component Value Date   HGBA1C 5.7 (H) 06/22/2023    Assessment & Plan   1. CAD:  S/p DES-LAD in 2009, DES-RCA, LAD in 2012. Repeat cath in 2015 did not  demonstrate significant restenosis of his LAD stents with mild 20% narrowing in the proximal stent and 40% mid stent.  Left circumflex was normal, RCA had 40% narrowing just proximal to a widely patent stent in the mid vessel.  Myocardial perfusion study in 2017 was normal, EF 62%.  He did not tolerate nitrate therapy due to headache. Cardiac PET stress test in 09/2023 showed no evidence of ischemia or infarction, EF 62%.  He continues to note intermittent fleeting chest discomfort, improved since starting Ranexa , he also reports mild shortness of breath with activity, he fatigues easily.  Will decrease amlodipine  as below.  Continue to monitor symptoms.  Continue aspirin , amlodipine  as  below, telmisartan , Ranexa , and Crestor .   2. Second degree AV block Mobitz type II/complete heart block: S/p PPM.   Remote device check in 10/2023 showed normal device function, presenting rhythm A-sensed, V-paced.he continues to note intermittent palpitations which he describes as his heart racing, mild associated dizziness at times.  Followed by EP, he is overdue for follow-up.  Will have him schedule an appointment today to see Dr. Inocencio.    3. Aortic stenosis: Most recent echocardiogram in 07/2023 showed EF 55%, normal LV function, RWMA with septal lateral dyssynchrony consistent with RV pacing, apical hypokinesis, mild concentric LVH, G1 DD, normal RV systolic function, mild to moderate aortic stenosis, mean gradient 12 mmHg. Euvolemic and well compensated on exam.    4. Hypertension: BP well-controlled, borderline low in office today.  He has not been checking his blood pressure at home though he does note symptoms of orthostatic dizziness.  He has lost over 80 pounds since starting Mounjaro .  Will decrease amlodipine  to 5 mg daily.  Continue to monitor BP and report BP consider than 130/80.  Monitor  symptoms.  Encouraged adequate hydration, gradual position changes.  Otherwise, continue current antihypertensive regimen.    5. Hyperlipidemia: LDL was 60 in 06/2023.  Continue Crestor .   6. OSA: Adherent to CPAP.    7. Type 2 diabetes: A1C was 5.7 in 06/2023.  Monitored and managed per PCP.    8. Disposition: Follow-up as scheduled with Dr. Jeffrie in 12/2023 (per pt request), follow-up next available with Dr. Inocencio, EP.       Damien JAYSON Braver, NP 12/19/2023, 10:04 AM

## 2023-12-30 ENCOUNTER — Other Ambulatory Visit (HOSPITAL_COMMUNITY): Payer: Self-pay

## 2023-12-30 ENCOUNTER — Encounter: Payer: Self-pay | Admitting: Family Medicine

## 2023-12-30 ENCOUNTER — Other Ambulatory Visit: Payer: Self-pay

## 2023-12-30 MED ORDER — OXYCODONE-ACETAMINOPHEN 10-325 MG PO TABS
1.0000 | ORAL_TABLET | Freq: Three times a day (TID) | ORAL | 0 refills | Status: DC | PRN
  Filled 2023-12-30: qty 21, 7d supply, fill #0

## 2024-01-05 ENCOUNTER — Encounter: Payer: Self-pay | Admitting: Cardiology

## 2024-01-05 ENCOUNTER — Other Ambulatory Visit (HOSPITAL_COMMUNITY): Payer: Self-pay

## 2024-01-05 ENCOUNTER — Ambulatory Visit: Attending: Cardiology | Admitting: Cardiology

## 2024-01-05 VITALS — BP 123/83 | HR 90 | Ht 68.0 in | Wt 168.0 lb

## 2024-01-05 DIAGNOSIS — Z950 Presence of cardiac pacemaker: Secondary | ICD-10-CM

## 2024-01-05 DIAGNOSIS — I441 Atrioventricular block, second degree: Secondary | ICD-10-CM | POA: Diagnosis not present

## 2024-01-05 DIAGNOSIS — E785 Hyperlipidemia, unspecified: Secondary | ICD-10-CM

## 2024-01-05 DIAGNOSIS — I25118 Atherosclerotic heart disease of native coronary artery with other forms of angina pectoris: Secondary | ICD-10-CM | POA: Diagnosis not present

## 2024-01-05 DIAGNOSIS — I4510 Unspecified right bundle-branch block: Secondary | ICD-10-CM

## 2024-01-05 DIAGNOSIS — Z9861 Coronary angioplasty status: Secondary | ICD-10-CM

## 2024-01-05 DIAGNOSIS — I2510 Atherosclerotic heart disease of native coronary artery without angina pectoris: Secondary | ICD-10-CM

## 2024-01-05 NOTE — Patient Instructions (Signed)
 Medication Instructions:  The current medical regimen is effective;  continue present plan and medications.  *If you need a refill on your cardiac medications before your next appointment, please call your pharmacy*  Follow-Up: At [facility], you and your health needs are our priority.  As part of our continuing mission to provide you with exceptional heart care, our providers are all part of one team.  This team includes your primary Cardiologist (physician) and Advanced Practice Providers or APPs (Physician Assistants and Nurse Practitioners) who all work together to provide you with the care you need, when you need it.  Your next appointment:   1 year(s)  Provider:   Damien Braver, NP          We recommend signing up for the patient portal called MyChart.  Sign up information is provided on this After Visit Summary.  MyChart is used to connect with patients for Virtual Visits (Telemedicine).  Patients are able to view lab/test results, encounter notes, upcoming appointments, etc.  Non-urgent messages can be sent to your provider as well.   To learn more about what you can do with MyChart, go to [email or web address].

## 2024-01-05 NOTE — Progress Notes (Signed)
 Cardiology Office Note:  .   Date:  01/05/2024  ID:  Lee [name], DOB Nov 20, 1950, MRN [id number] PCP: Lee Butler [name], MD   [facility] Providers Cardiologist:  Lee Parchment, MD Electrophysiologist:  Lee Rakers, MD (Inactive)     History of Present Illness: .   Lee [name] is a 73 y.o. male Discussed the use of AI scribe software for clinical note transcription with the patient, who gave verbal consent to proceed.  History of Present Illness Lee [name] is a 73 year old male with coronary artery disease and type 2 diabetes who presents for follow-up of his cardiac conditions.  He has a history of coronary artery disease with drug-eluting stents placed in the LAD and RCA in 2009. A repeat catheterization in 2015 showed no restenosis, and a cardiac PET scan in May 2025 showed no ischemia. He experiences intermittent, fleeting chest discomfort since starting Ranexa , occurring nightly but lasting only a few seconds. There is an improvement in energy levels since his blood pressure medication was adjusted.  He has a pacemaker and is followed by electrophysiology, with the device functioning well. He also has a history of aortic stenosis with a mean gradient of 12 mmHg. His last echocardiogram in March 2025 showed an ejection fraction of 55%.  He has hyperlipidemia, with an LDL of 60 on Crestor  40 mg, which he is adherent to. He also takes telmisartan  40 mg, amlodipine  5 mg, aspirin  81 mg, Jardiance  25 mg, and furosemide  once daily. His creatinine is 0.72.  He has type 2 diabetes and is adherent to CPAP therapy for sleep apnea, although he reports issues with the comfort and fit of his CPAP mask. He is exploring other options for sleep apnea management.     Studies Reviewed: .        Results LABS LDL: 60 mg/dL Creatinine: 9.27 mg/dL  RADIOLOGY Cardiac PET: No ischemia (09/2023)  DIAGNOSTIC Echocardiogram: Normal left ventricular ejection fraction (LVEF) 55%,  mild to moderate aortic valve stenosis with minimal calcification (08/03/2023) Risk Assessment/Calculations:            Physical Exam:   VS:  BP 123/83   Pulse 90   Ht 5' 8 (1.727 m)   Wt 168 lb (76.2 kg)   SpO2 97%   BMI 25.54 kg/m    Wt Readings from Last 3 Encounters:  01/05/24 168 lb (76.2 kg)  12/19/23 172 lb (78 kg)  11/07/23 179 lb (81.2 kg)    GEN: Well nourished, well developed in no acute distress NECK: No JVD; No carotid bruits CARDIAC: RRR, no murmurs, no rubs, no gallops RESPIRATORY:  Clear to auscultation without rales, wheezing or rhonchi  ABDOMEN: Soft, non-tender, non-distended EXTREMITIES:  No edema; No deformity   ASSESSMENT AND PLAN: .    Assessment and Plan Assessment & Plan Coronary artery disease with prior drug-eluting stents Coronary artery disease with prior drug-eluting stents in the LAD and RCA. No restenosis on repeat catheterization in 2015. Cardiac PET in May 2025 showed no ischemia. Intermittent fleeting chest discomfort since starting Ranexa , but episodes are brief and not severe. - Continue Ranexa  - Continue Crestor  40 mg - Continue aspirin  81 mg  Aortic valve stenosis, mild to moderate Mild to moderate aortic valve stenosis with a mean gradient of 12 mmHg. Echocardiogram from August 03, 2023, shows normal pump function with an EF of 55%. Calcification on the valve is minimal, and valve replacement is not needed at this time. - Monitor with  periodic echocardiograms  Hypertension Hypertension managed with amlodipine , which was reduced from 10 mg to 5 mg due to low blood pressure readings. - Continue amlodipine  5 mg - Continue telmisartan  40 mg  Hyperlipidemia Hyperlipidemia well-controlled with Crestor . LDL is 60 mg/dL. - Continue Crestor  40 mg  Type 2 diabetes mellitus Type 2 diabetes mellitus managed with Jardiance . - Continue Jardiance  25 mg  OSA - Had some concerns of the fitting of his CPAP mask.  Is having trouble finding  [facility] which [facility].  Dr. Ines his neurologist also help him with OSA.        Dispo: 1 yr with Daneen, NP  Signed, Lee Parchment, MD

## 2024-01-11 ENCOUNTER — Other Ambulatory Visit (HOSPITAL_COMMUNITY)

## 2024-01-18 ENCOUNTER — Encounter: Payer: Self-pay | Admitting: Family Medicine

## 2024-01-23 ENCOUNTER — Encounter (INDEPENDENT_AMBULATORY_CARE_PROVIDER_SITE_OTHER): Payer: Self-pay | Admitting: Otolaryngology

## 2024-01-23 ENCOUNTER — Other Ambulatory Visit (HOSPITAL_COMMUNITY): Payer: Self-pay

## 2024-01-23 ENCOUNTER — Ambulatory Visit (INDEPENDENT_AMBULATORY_CARE_PROVIDER_SITE_OTHER): Admitting: Otolaryngology

## 2024-01-23 VITALS — BP 131/81 | HR 89 | Ht 68.0 in | Wt 165.0 lb

## 2024-01-23 DIAGNOSIS — H938X1 Other specified disorders of right ear: Secondary | ICD-10-CM | POA: Diagnosis not present

## 2024-01-23 DIAGNOSIS — Z789 Other specified health status: Secondary | ICD-10-CM

## 2024-01-23 DIAGNOSIS — Z91198 Patient's noncompliance with other medical treatment and regimen for other reason: Secondary | ICD-10-CM

## 2024-01-23 DIAGNOSIS — H6991 Unspecified Eustachian tube disorder, right ear: Secondary | ICD-10-CM

## 2024-01-23 DIAGNOSIS — G4733 Obstructive sleep apnea (adult) (pediatric): Secondary | ICD-10-CM | POA: Diagnosis not present

## 2024-01-23 MED ORDER — PREDNISONE 20 MG PO TABS
20.0000 mg | ORAL_TABLET | Freq: Every day | ORAL | 0 refills | Status: AC
  Filled 2024-01-23: qty 7, 7d supply, fill #0

## 2024-01-23 NOTE — Progress Notes (Signed)
 Dear Dr. Duanne, Here is my assessment for our mutual patient, Magnus Crescenzo. Thank you for allowing me the opportunity to care for your patient. Please do not hesitate to contact me should you have any other questions. Sincerely, Dr. Eldora Blanch  Otolaryngology Clinic Note Referring provider: Dr. Duanne HPI:  Lee [name] is a 73 y.o. male kindly referred by Dr. Duanne for evaluation of ear fullness  Initial visit (12/2023: Patient reports: reports R>L ear fullness, ongoing for several months (maybe about a year). Comes and goes - only happens when he is outside working but now comes on more regularly. Takes about 45 mins - 1 hour and then it gets back to normal. No associated symptoms in ear including tinnitus, frank vertigo, worsening hearing loss. He does report that he has major allergy issues and has had septo/turbs with Dr. Mable --- does have typical AR symptoms. He tried flonase  but did not help. No antecedent event including URI Patient denies: ear pain, fullness, vertigo, drainage, tinnitus Patient also denies barotrauma, vestibular suppressant use, ototoxic medication use Prior ear surgery: no  Does use HA  Of note, he also uses CPAP and has OSA and is interested in [facility] device. However, last sleep test was several years ago and he has lost over 50lbs since.  Personal or FHx of bleeding dz or anesthesia difficulty: no  AP/AC: ASA 81  Tobacco: former, quit  ENT Surgery: septo/turbs  PMHx: CAD s/p stents, AV block, Aortic stenosis, HTN, HLD, OSA, T2DM, Migraines  Independent Review of Additional Tests or Records:  Dr. Duanne (11/07/2023): noted both ears feel stopped up, left > right, for months; no tinnitus or hearing loss or vertigo; Dx: ETD; Rx: flonase  BID, ref to ENT if not improving CBC and CMP 06/22/2023: WBC 7.1, Eos 178; BUN/Cr 11/0.72 CTH 05/03/2020 independently interpreted with respect to sinuses/nasal cavity and ears: cannot visualize max entirely  but visualized paranasal sinuses clear; cuts thick but mastoids and ME well aerated and unable to appreciate any otic capsule or ossicular chain abnormalities  PMH/Meds/All/SocHx/FamHx/ROS:   Past Medical History:  Diagnosis Date   Allergic rhinitis    chronic sinusitis   Allergy    Arrhythmia    Arthritis    osetoarthritis   CAD (coronary artery disease)    a. 2009 PCI/DES to mLAD; b. 05/2010 s/p DES RCA and LAD.; c. 05/2013 Cath: LAD mild ISR, LCX nl, RCA 40p, patent stents.   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   Cataract    has had lasik surg   COPD (chronic obstructive pulmonary disease) (HCC)    Depression    Diabetes mellitus type 2, controlled, without complications (HCC)    Diabetes mellitus without complication (HCC)    Phreesia 08/22/2020   Diverticulosis    DJD (degenerative joint disease)    Esophagitis 1991   grade 1   GERD (gastroesophageal reflux disease)    Hiatal hernia   Hearing loss    wears bilateral hearing aids   Heart murmur    Dr Duanne dx per patient   Hemorrhoids    Hyperlipidemia    Hypertension    Hypertensive heart disease    Iron deficiency anemia    Migraines    Myocardial infarction [facility])    Paroxysmal atrial fibrillation (HCC)    a. s/p afib ablation 10/22/08 (J. Allred).   Presence of permanent cardiac pacemaker    PVC's (premature ventricular contractions)    Sleep apnea  Uses nightly.  Questionalble: RDI during the total sleep time 6h 28 mins was 3.55/hr during REM sleep was at 5.71/hr. Supine AHI was 5.93/hr.   Special screening for malignant neoplasms, colon 10/01/2022   Syncope 08/2018     Past Surgical History:  Procedure Laterality Date   ABDOMINAL HYSTERECTOMY     CARDIAC CATHETERIZATION  05/31/2010   The proximal LAD was then predilated with 2.0 x 12 trek. this was then stented with a 2.5 x 16 promus Element drug-eluting stent at 14 atmosphere and prostdilated with 2.75 x 12 [place] trek at 16  atmospheres(2.8 mm) resulting the reduction of 80% proximal LAD stenosis to 0% residual with excellent flow.   CARDIAC CATHETERIZATION  06/05/2010   Successful percutaneous coronary intervention to the right coronary artery with percutaneous transluminal coronary angioplasty/stenting and insertion 3.0 x 16 mm Promus DES post dilated to 3.35 mm with stenosis being reduced to 0%   CARDIAC CATHETERIZATION  02/29/2008   Post dilatation was performed using a 2.75 x 9 [place] sprinter, 10 atmospheres for 40 seconds and then 9 atmosphere for 35 sec. This resulted in the 80% area of narrowing pre-intervention, now appearing to normal. There was no edvidence of the dissection or thrombus and there was TIMI III flow pre and post.   CARDIAC CATHETERIZATION  02/28/2006   COLONOSCOPY WITH PROPOFOL  N/A 10/01/2022   Procedure: COLONOSCOPY WITH PROPOFOL ;  Surgeon: Eartha Angelia Sieving, MD;  [facility]: [facility];  Service: Gastroenterology;  Laterality: N/A;  8:00am;asa 3   CORONARY ANGIOPLASTY WITH STENT PLACEMENT     3 stents/ 4 caths   DISTAL BICEPS TENDON REPAIR Left 02/12/2022   Procedure: LEFT DISTAL BICEPS TENDON REPAIR;  Surgeon: Jerri Kay HERO, MD;  Location: [facility] OR;  Service: Orthopedics;  Laterality: Left;   DISTAL BICEPS TENDON REPAIR Left 03/03/2022   Procedure: LEFT DISTAL BICEPS TENDON REPAIR;  Surgeon: Jerri Kay HERO, MD;  Location: [facility] OR;  Service: Orthopedics;  Laterality: Left;   EYE SURGERY     FINGER ARTHROPLASTY Right 09/02/2021   Procedure: RIGHT THUMB CARPOMETACARPAL ARTHROPLASTY;  Surgeon: Jerri Kay HERO, MD;  Location: [facility];  Service: Orthopedics;  Laterality: Right;  block in preop   INSERT / REPLACE / REMOVE PACEMAKER  05/01/2019   KNEE ARTHROSCOPY     right   LASIK     LEFT HEART CATHETERIZATION WITH CORONARY ANGIOGRAM N/A 06/29/2013   Procedure: LEFT HEART CATHETERIZATION WITH CORONARY ANGIOGRAM;  Surgeon: Alm LELON Clay, MD;  Location: [facility] CATH LAB;  Service:  Cardiovascular;  Laterality: N/A;   NASAL SINUS SURGERY  06/01/1999   NECK SURGERY     Cervical fusion   PACEMAKER IMPLANT N/A 05/01/2019   Procedure: PACEMAKER IMPLANT;  Surgeon: Kelsie Agent, MD;  Location: [facility]VAS[facility] CV LAB;  Service: Cardiovascular;  Laterality: N/A;   RADIOFREQUENCY ABLATION  09/28/2008   atrial fibrillation   ROTATOR CUFF REPAIR     left   SHOULDER OPEN ROTATOR CUFF REPAIR     right   SPINE SURGERY     WRIST ARTHROCENTESIS     left    Family History  Problem Relation Age of Onset   Colon cancer Mother        mets from uterine   Uterine cancer Mother    Cancer Mother    Heart disease Father        and sister   Diabetes Father    Hearing loss Father    Stroke Father  Hypertension Father    Heart attack Father    Heart disease Sister    Lung cancer Sister    Stroke Brother    Hypertension Brother    Heart attack Brother    Migraines Neg Hx      Social Connections: Moderately Integrated (10/27/2023)   Social Connection and Isolation Panel    Frequency of Communication with Friends and Family: More than three times a week    Frequency of Social Gatherings with Friends and Family: Once a week    Attends Religious Services: More than 4 times per year    Active Member of [employer] or Organizations: No    Attends Engineer, structural: Never    Marital Status: Married      Current Outpatient Medications:    Accu-Chek Neurosurgeon MISC, Check blood sugar 2 (two) times daily as directed, Disp: 102 each, Rfl: 5   acetaminophen  (TYLENOL ) 325 MG tablet, Take 1-2 tablets (325-650 mg total) by mouth every 4 (four) hours as needed for mild pain., Disp:  , Rfl:    amLODipine  (NORVASC ) 5 MG tablet, Take 1 tablet (5 mg total) by mouth daily., Disp: 90 tablet, Rfl: 3   aspirin  EC 81 MG tablet, Take 81 mg by mouth at bedtime., Disp: , Rfl:    Atogepant  (QULIPTA ) 60 MG TABS, Take 1 tablet (60 mg total) by mouth daily., Disp: 90 tablet, Rfl: 3   Blood  Glucose Monitoring Suppl (ACCU-CHEK AVIVA PLUS) w/Device KIT, Check BS bid E11.9, Disp: 1 kit, Rfl: 1   cholecalciferol  (VITAMIN D ) 1000 UNITS tablet, Take 1,000 Units by mouth at bedtime. , Disp: , Rfl:    citalopram  (CELEXA ) 40 MG tablet, Take 1 tablet (40 mg total) by mouth daily., Disp: 90 tablet, Rfl: 1   Coenzyme Q10 (CO Q 10) 100 MG CAPS, Take 100 mg by mouth daily with breakfast. , Disp: , Rfl:    empagliflozin  (JARDIANCE ) 25 MG TABS tablet, Take 1 tablet (25 mg total) by mouth daily before breakfast., Disp: 90 tablet, Rfl: 3   fexofenadine (ALLEGRA) 180 MG tablet, Take 180 mg by mouth daily with breakfast. , Disp: , Rfl:    fluticasone  (FLONASE ) 50 MCG/ACT nasal spray, INSTILL 2 SPRAYS IN THE  NOSE AT BEDTIME, Disp: 48 g, Rfl: 3   fluticasone  (FLONASE ) 50 MCG/ACT nasal spray, Place 2 sprays into both nostrils daily., Disp: 16 g, Rfl: 6   furosemide  (LASIX ) 40 MG tablet, Take 1 tablet (40 mg total) by mouth daily., Disp: 90 tablet, Rfl: 3   glucose blood test strip, Check blood sugar 2 (two) times daily., Disp: 100 each, Rfl: 12   Lancets Misc. (ACCU-CHEK FASTCLIX LANCET) KIT, USE AS DIRECTED TO CHECK BLOOD SUGAR TWICE DAILY, Disp: 1 kit, Rfl: 1   metroNIDAZOLE  (METROCREAM ) 0.75 % cream, Apply topically 2 (two) times daily to forehead, nose and cheeks, Disp: 45 g, Rfl: 3   Multiple Vitamin (MULTIVITAMIN WITH MINERALS) TABS tablet, Take 1 tablet by mouth daily with breakfast., Disp: , Rfl:    nitroGLYCERIN  (NITROSTAT ) 0.4 MG SL tablet, PLACE ONE TABLET UNDER THE TONGUE EVERY 5 MINUTES AS NEEDED FOR CHEST PAIN, Disp: 25 tablet, Rfl: 3   oxyCODONE -acetaminophen  (PERCOCET) 10-325 MG tablet, Take 1 tablet by mouth every 8 (eight) hours as needed for pain., Disp: 21 tablet, Rfl: 0   pantoprazole  (PROTONIX ) 40 MG tablet, Take 1 tablet (40 mg total) by mouth 2 (two) times daily., Disp: 180 tablet, Rfl: 1   Polyvinyl Alcohol-Povidone (  REFRESH OP), Place 1 drop into both eyes at bedtime as needed (dry  eyes)., Disp: , Rfl:    potassium chloride  (MICRO-K ) 10 MEQ CR capsule, Take 1 capsule (10 mEq total) by mouth daily., Disp: 90 capsule, Rfl: 3   predniSONE  (DELTASONE ) 20 MG tablet, Take 1 tablet (20 mg total) by mouth daily with breakfast for 7 days., Disp: 7 tablet, Rfl: 0   ranolazine  (RANEXA ) 500 MG 12 hr tablet, Take 1 tablet (500 mg total) by mouth 2 (two) times daily., Disp: 180 tablet, Rfl: 0   Rimegepant Sulfate  75 MG TBDP, Take 1 tablet (75 mg total) by mouth daily as needed for migraine., Disp: 16 tablet, Rfl: 11   rosuvastatin  (CRESTOR ) 40 MG tablet, Take 1 tablet (40 mg total) by mouth daily., Disp: 90 tablet, Rfl: 2   telmisartan  (MICARDIS ) 40 MG tablet, Take 1 tablet (40 mg total) by mouth daily., Disp: 90 tablet, Rfl: 2   tirzepatide  (MOUNJARO ) 10 MG/0.5ML Pen, Inject 10 mg into the skin once a week. (Patient not taking: Reported on 01/23/2024), Disp: 6 mL, Rfl: 11   Physical Exam:   BP 131/81 (BP Location: Right Arm, Patient Position: Sitting, Cuff Size: Normal)   Pulse 89   Ht 5' 8 (1.727 m)   Wt 165 lb (74.8 kg)   SpO2 96%   BMI 25.09 kg/m   Salient findings:  CN II-XII intact Given history and complaints, ear microscopy was indicated and performed for evaluation with findings as below in physical exam section and in procedures; Bilateral EAC clear and TM intact with well pneumatized middle ear spaces Weber 512: mid Rinne 512: AC > BC b/l  Anterior rhinoscopy: Septum intact; bilateral inferior turbinates without significant hypertrophy No lesions of oral cavity/oropharynx; tongue friedman 2 No obviously palpable neck masses/lymphadenopathy/thyromegaly No respiratory distress or stridor  Seprately Identifiable Procedures:  Prior to initiating any procedures, risks/benefits/alternatives were explained to the patient and verbal consent obtained. Procedure: Bilateral ear microscopy using microscope (CPT [id number]) Pre-procedure diagnosis: right ear fullness Post-procedure  diagnosis: same Indication: see above; given patient's otologic complaints and history, for improved and comprehensive examination of external ear and tympanic membrane, bilateral otologic examination using microscope was performed. Prior to proceeding, verbal consent was obtained after discussion of R/B/A  Procedure: Patient was placed semi-recumbent. Both ear canals were examined using the microscope with findings above. Patient tolerated the procedure well.   Impression & Plans:  Kalieb Freeland is a 73 y.o. male with  1. Dysfunction of right eustachian tube   2. Sensation of fullness in right ear   3. OSA (obstructive sleep apnea)   4. Intolerance of continuous positive airway pressure (CPAP) ventilation    Transient, lasts about 30-60 mins. Has tried flonase  without benefit. We discussed DDX and options - decided to do pred burst and autoinsufflation. If no improvement, can consider right diagnostic myringotomy. We also discussed referred sources  OSA: will need updated sleep test; briefly discussed inspire but will get to meet criteria prior to DISE  - f/u 3 months with audio  See below regarding exact medications prescribed this encounter including dosages and route: Meds ordered this encounter  Medications   predniSONE  (DELTASONE ) 20 MG tablet    Sig: Take 1 tablet (20 mg total) by mouth daily with breakfast for 7 days.    Dispense:  7 tablet    Refill:  0      Thank you for allowing me the opportunity to care for your patient. Please do  not hesitate to contact me should you have any other questions.  Sincerely, Eldora Blanch, MD Otolaryngologist (ENT), [facility] Phone: [phone] Fax: [phone]  01/23/2024, 8:55 AM   MDM:  Level 4 [phone] Complexity/Problems addressed: mod - chronic problems Data complexity: high - independent interpretation of CT; review of notes, labs, ordering test - Morbidity: mod  - Prescription Drug prescribed or  managed: y

## 2024-01-23 NOTE — Patient Instructions (Addendum)
 Use flonase  two sprays each nostril twice per day Pop your ears multiple times per day Use 20mg  of prednisone  daily for a week   Aureliano Med Nasal Saline Rinse   - start nasal saline rinses with NeilMed Bottle available over the counter    Nasal Saline Irrigation instructions: If you choose to make your own salt water solution, You will need: Salt (kosher, canning, or pickling salt) Baking soda Nasal irrigation bottle (i.e. Aureliano Med Sinus Rinse) Measuring spoon ( teaspoon) Distilled / boiled water   Mix solution Mix 1 teaspoon of salt, 1/2 teaspoon of baking soda and 1 cup of water into irrigation bottle ** May use saline packet instead of homemade recipe for this step if you prefer If medicine was prescribed to be mixed with solution, place this into bottle Examples 2 inches of 2% mupirocin ointment Budesonide solution Position your head: Lean over sink (about 45 degrees) Rotate head (about 45 degrees) so that one nostril is above the other Irrigate Insert tip of irrigation bottle into upper nostril so it forms a comfortable seal Irrigate while breathing through your mouth May remove the straw from the bottle in order to irrigate the entire solution (important if medicine was added) Exhale through nose when finished and blow nose as necessary  Repeat on opposite side with other 1/2 of solution (120 mL) or remake solution if all 240 mL was used on first side Wash irrigation bottle regularly, replace every 3 months

## 2024-01-26 ENCOUNTER — Encounter: Payer: Self-pay | Admitting: Family Medicine

## 2024-01-26 ENCOUNTER — Other Ambulatory Visit: Payer: Self-pay

## 2024-01-26 DIAGNOSIS — G4733 Obstructive sleep apnea (adult) (pediatric): Secondary | ICD-10-CM

## 2024-01-31 ENCOUNTER — Other Ambulatory Visit: Payer: Self-pay | Admitting: Family Medicine

## 2024-01-31 ENCOUNTER — Ambulatory Visit: Attending: Cardiology | Admitting: Cardiology

## 2024-01-31 ENCOUNTER — Encounter: Payer: Self-pay | Admitting: Cardiology

## 2024-01-31 VITALS — BP 125/80 | HR 79 | Ht 68.0 in | Wt 166.0 lb

## 2024-01-31 DIAGNOSIS — I10 Essential (primary) hypertension: Secondary | ICD-10-CM

## 2024-01-31 DIAGNOSIS — I2510 Atherosclerotic heart disease of native coronary artery without angina pectoris: Secondary | ICD-10-CM

## 2024-01-31 DIAGNOSIS — I480 Paroxysmal atrial fibrillation: Secondary | ICD-10-CM | POA: Diagnosis not present

## 2024-01-31 DIAGNOSIS — E119 Type 2 diabetes mellitus without complications: Secondary | ICD-10-CM

## 2024-01-31 DIAGNOSIS — I441 Atrioventricular block, second degree: Secondary | ICD-10-CM

## 2024-01-31 NOTE — Progress Notes (Signed)
  Electrophysiology Office Note:   Date:  01/31/2024  ID:  NOVAH GOZA, DOB 1951/05/05, MRN [id number]  Primary Cardiologist: Oneil Parchment, MD Primary Heart Failure: None Electrophysiologist: [name], MD (Inactive)      History of Present Illness:   [name] is a 73 y.o. male with h/o coronary artery disease post multiple PCI's, hypertension, hyperlipidemia, diabetes, COPD, advanced heart block, atrial fibrillation post ablation 2010, sleep apnea seen today for routine electrophysiology followup.   Since last being seen in our clinic the patient reports doing overall well aside from some mild fatigue and shortness of breath.  He has been feeling this way for a few months.  He has also noted that his blood pressure at times has been low.  His primary physician reduced his amlodipine  to 5 mg.  He continues to have blood pressures in the low 100s and symptoms of fatigue.  He had an echo in March with a normal ejection fraction and a cardiac PET without major abnormality.  he denies chest pain, palpitations, dyspnea, PND, orthopnea, nausea, vomiting, dizziness, syncope, edema, weight gain, or early satiety.   Review of systems complete and found to be negative unless listed in HPI.      EP Information / Studies Reviewed:    EKG is not ordered today. EKG from 12/19/2023 reviewed which showed atrial sensed, ventricular paced      PPM Interrogation-  reviewed in detail today,  See PACEART report.  Device History: Abbott Dual Chamber PPM implanted 05/01/2019 for Second Degree AV block  Risk Assessment/Calculations:           Physical Exam:   VS:  BP 125/80 (BP Location: Left Arm, Patient Position: Sitting, Cuff Size: Normal)   Pulse 79   Ht 5' 8 (1.727 m)   Wt 166 lb (75.3 kg)   SpO2 97%   BMI 25.24 kg/m    Wt Readings from Last 3 Encounters:  01/31/24 166 lb (75.3 kg)  01/23/24 165 lb (74.8 kg)  01/05/24 168 lb (76.2 kg)     GEN: Well nourished, well developed  in no acute distress NECK: No JVD; No carotid bruits CARDIAC: Regular rate and rhythm, no murmurs, rubs, gallops RESPIRATORY:  Clear to auscultation without rales, wheezing or rhonchi  ABDOMEN: Soft, non-tender, non-distended EXTREMITIES:  No edema; No deformity   ASSESSMENT AND PLAN:    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  Of increased output.  2.  Paroxysmal atrial fibrillation: Post ablation 2010.  Low burden on device interrogation.  No anticoagulation at this time.  3.  Coronary artery disease: Has follow-up with general cardiology.  No current chest pain.  4.  Hypertension: Well-controlled.  Cannon Arreola stop amlodipine .  Disposition:   Follow up with EP APP in 12 months  Signed, Jahmai Finelli Gladis Norton, MD

## 2024-01-31 NOTE — Patient Instructions (Signed)
 Medication Instructions:  Your physician has recommended you make the following change in your medication:  STOP Norvasc  (Amlodipine )   *If you need a refill on your cardiac medications before your next appointment, please call your pharmacy*  Lab Work: None ordered   Testing/Procedures: None ordered  Follow-Up: At [facility], you and your health needs are our priority.  As part of our continuing mission to provide you with exceptional heart care, our providers are all part of one team.  This team includes your primary Cardiologist (physician) and Advanced Practice Providers or APPs (Physician Assistants and Nurse Practitioners) who all work together to provide you with the care you need, when you need it.  Your next appointment:   1 year(s)  Provider:   Charlies Arthur, PA-C     Thank you for choosing [facility]!!   Maeola Domino, RN [phone]

## 2024-02-01 ENCOUNTER — Other Ambulatory Visit (HOSPITAL_COMMUNITY): Payer: Self-pay

## 2024-02-01 LAB — CUP PACEART INCLINIC DEVICE CHECK
Battery Remaining Longevity: 36 mo
Battery Voltage: 2.98 V
Brady Statistic RA Percent Paced: 0.1 %
Brady Statistic RV Percent Paced: 99.83 %
Date Time Interrogation Session: 20250902155000
Implantable Lead Connection Status: 753985
Implantable Lead Connection Status: 753985
Implantable Lead Implant Date: 20201201
Implantable Lead Implant Date: 20201201
Implantable Lead Location: 753859
Implantable Lead Location: 753860
Implantable Pulse Generator Implant Date: 20201201
Lead Channel Impedance Value: 462.5 Ohm
Lead Channel Impedance Value: 462.5 Ohm
Lead Channel Pacing Threshold Amplitude: 0.75 V
Lead Channel Pacing Threshold Amplitude: 0.75 V
Lead Channel Pacing Threshold Amplitude: 1.5 V
Lead Channel Pacing Threshold Amplitude: 1.5 V
Lead Channel Pacing Threshold Pulse Width: 0.5 ms
Lead Channel Pacing Threshold Pulse Width: 0.5 ms
Lead Channel Pacing Threshold Pulse Width: 0.5 ms
Lead Channel Pacing Threshold Pulse Width: 0.5 ms
Lead Channel Sensing Intrinsic Amplitude: 1.6 mV
Lead Channel Sensing Intrinsic Amplitude: 2.3 mV
Lead Channel Setting Pacing Amplitude: 2 V
Lead Channel Setting Pacing Amplitude: 3 V
Lead Channel Setting Pacing Pulse Width: 0.5 ms
Lead Channel Setting Sensing Sensitivity: 0.5 mV
Pulse Gen Model: 2272
Pulse Gen Serial Number: 9182765

## 2024-02-01 MED ORDER — EMPAGLIFLOZIN 25 MG PO TABS
25.0000 mg | ORAL_TABLET | Freq: Every day | ORAL | 3 refills | Status: AC
  Filled 2024-02-01: qty 90, 90d supply, fill #0
  Filled 2024-04-28: qty 90, 90d supply, fill #1

## 2024-02-01 NOTE — Telephone Encounter (Signed)
 Requested medication (s) are due for refill today: yes  Requested medication (s) are on the active medication list: yes  Last refill:  02/01/23 #90 3 RF  Future visit scheduled: no  Notes to clinic:  overdue lab work   Requested Prescriptions  Pending Prescriptions Disp Refills   empagliflozin  (JARDIANCE ) 25 MG TABS tablet 90 tablet 3    Sig: Take 1 tablet (25 mg total) by mouth daily before breakfast.     Endocrinology:  Diabetes - SGLT2 Inhibitors Failed - 02/01/2024  7:34 AM      Failed - HBA1C is between 0 and 7.9 and within 180 days    Hgb A1c MFr Bld  Date Value Ref Range Status  06/22/2023 5.7 (H) <5.7 % of total Hgb Final    Comment:    For someone without known diabetes, a hemoglobin  A1c value between 5.7% and 6.4% is consistent with prediabetes and should be confirmed with a  follow-up test. . For someone with known diabetes, a value <7% indicates that their diabetes is well controlled. A1c targets should be individualized based on duration of diabetes, age, comorbid conditions, and other considerations. . This assay result is consistent with an increased risk of diabetes. . Currently, no consensus exists regarding use of hemoglobin A1c for diagnosis of diabetes for children. .          Passed - Cr in normal range and within 360 days    Creat  Date Value Ref Range Status  06/22/2023 0.72 0.70 - 1.28 mg/dL Final   Creatinine, Urine  Date Value Ref Range Status  02/07/2023 20 20 - 320 mg/dL Final         Passed - eGFR in normal range and within 360 days    GFR, Est African American  Date Value Ref Range Status  08/18/2020 102 > OR = 60 mL/min/1.2m2 Final   GFR, Est Non African American  Date Value Ref Range Status  08/18/2020 88 > OR = 60 mL/min/1.54m2 Final   GFR, Estimated  Date Value Ref Range Status  10/01/2022 >60 >60 mL/min Final    Comment:    (NOTE) Calculated using the CKD-EPI Creatinine Equation (2021)    eGFR  Date Value Ref  Range Status  06/22/2023 96 > OR = 60 mL/min/1.51m2 Final         Passed - Valid encounter within last 6 months    Recent Outpatient Visits           2 months ago Dysfunction of both eustachian tubes   [place] [facility] Duanne Butler [name], MD   4 months ago Angina pectoris [facility])   [facility] Duanne Butler [name], MD   7 months ago Type 2 diabetes mellitus treated without insulin  [facility])   [place] [facility] Duanne Butler [name], MD   11 months ago Type 2 diabetes mellitus with complication, without long-term current use of insulin  [facility])   [place] [facility] Pickard, Butler [name], MD   1 year ago Colon cancer screening   [place] [facility] Pickard, Butler [name], MD

## 2024-02-02 ENCOUNTER — Ambulatory Visit: Payer: Self-pay | Admitting: Cardiology

## 2024-02-02 NOTE — Progress Notes (Signed)
 Remote pacemaker transmission.

## 2024-02-02 NOTE — Addendum Note (Signed)
 Addended by: VICCI SELLER A on: 02/02/2024 02:49 PM   Modules accepted: Orders

## 2024-02-09 ENCOUNTER — Other Ambulatory Visit: Payer: Self-pay

## 2024-02-09 ENCOUNTER — Other Ambulatory Visit (HOSPITAL_COMMUNITY): Payer: Self-pay

## 2024-02-09 ENCOUNTER — Other Ambulatory Visit: Payer: Self-pay | Admitting: Nurse Practitioner

## 2024-02-09 ENCOUNTER — Other Ambulatory Visit: Payer: Self-pay | Admitting: Family Medicine

## 2024-02-09 DIAGNOSIS — F32A Depression, unspecified: Secondary | ICD-10-CM

## 2024-02-09 MED ORDER — RANOLAZINE ER 500 MG PO TB12
500.0000 mg | ORAL_TABLET | Freq: Two times a day (BID) | ORAL | 3 refills | Status: AC
  Filled 2024-02-09: qty 180, 90d supply, fill #0
  Filled 2024-05-06: qty 180, 90d supply, fill #1

## 2024-02-09 MED ORDER — CITALOPRAM HYDROBROMIDE 40 MG PO TABS
40.0000 mg | ORAL_TABLET | Freq: Every day | ORAL | 1 refills | Status: AC
  Filled 2024-02-09: qty 90, 90d supply, fill #0
  Filled 2024-05-15: qty 90, 90d supply, fill #1

## 2024-02-15 ENCOUNTER — Encounter: Payer: Self-pay | Admitting: Family Medicine

## 2024-02-17 ENCOUNTER — Encounter: Payer: Self-pay | Admitting: Family Medicine

## 2024-02-17 ENCOUNTER — Other Ambulatory Visit (HOSPITAL_COMMUNITY): Payer: Self-pay

## 2024-02-17 ENCOUNTER — Ambulatory Visit: Admitting: Family Medicine

## 2024-02-17 VITALS — BP 124/72 | HR 77 | Temp 97.7°F | Ht 68.0 in | Wt 171.4 lb

## 2024-02-17 DIAGNOSIS — Z23 Encounter for immunization: Secondary | ICD-10-CM

## 2024-02-17 DIAGNOSIS — E119 Type 2 diabetes mellitus without complications: Secondary | ICD-10-CM

## 2024-02-17 DIAGNOSIS — Z7984 Long term (current) use of oral hypoglycemic drugs: Secondary | ICD-10-CM | POA: Diagnosis not present

## 2024-02-17 MED ORDER — TIRZEPATIDE 5 MG/0.5ML ~~LOC~~ SOAJ
5.0000 mg | SUBCUTANEOUS | 3 refills | Status: AC
  Filled 2024-02-17: qty 6, 84d supply, fill #0
  Filled 2024-04-28: qty 6, 84d supply, fill #1

## 2024-02-17 MED ORDER — ZEPBOUND 5 MG/0.5ML ~~LOC~~ SOAJ
5.0000 mg | SUBCUTANEOUS | 5 refills | Status: DC
  Filled 2024-02-17: qty 2, 28d supply, fill #0

## 2024-02-17 MED ORDER — COVID-19 MRNA VAC-TRIS(PFIZER) 30 MCG/0.3ML IM SUSY
0.3000 mL | PREFILLED_SYRINGE | Freq: Once | INTRAMUSCULAR | 0 refills | Status: AC
  Filled 2024-02-17: qty 0.3, 1d supply, fill #0

## 2024-02-17 NOTE — Addendum Note (Signed)
 Addended by: ANGELENA RONAL BRADLEY K on: 02/17/2024 01:36 PM   Modules accepted: Orders

## 2024-02-17 NOTE — Progress Notes (Signed)
 Subjective:    Patient ID: Lee [name], male    DOB: [birth year], 73 y.o.   MRN: [id number] .  Patient was recently on Mounjaro .  He got up to 12.5 mg but due to weight loss, he discontinued the medication.  After stopping the medicine, his weight has increased almost 5 pounds.  He also reports that his blood sugars are starting to go back up.  Fasting blood sugars over the last several weeks have been 106-110 whereas previously they were in the low 90s. Past Medical History:  Diagnosis Date   Allergic rhinitis    chronic sinusitis   Allergy    Arrhythmia    Arthritis    osetoarthritis   CAD (coronary artery disease)    a. 2009 PCI/DES to mLAD; b. 05/2010 s/p DES RCA and LAD.; c. 05/2013 Cath: LAD mild ISR, LCX nl, RCA 40p, patent stents.   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   Cataract    has had lasik surg   COPD (chronic obstructive pulmonary disease) (HCC)    Depression    Diabetes mellitus type 2, controlled, without complications (HCC)    Diabetes mellitus without complication (HCC)    Phreesia 08/22/2020   Diverticulosis    DJD (degenerative joint disease)    Esophagitis 1991   grade 1   GERD (gastroesophageal reflux disease)    Hiatal hernia   Hearing loss    wears bilateral hearing aids   Heart murmur    Dr Duanne dx per patient   Hemorrhoids    Hyperlipidemia    Hypertension    Hypertensive heart disease    Iron deficiency anemia    Migraines    Myocardial infarction [facility])    Paroxysmal atrial fibrillation (HCC)    a. s/p afib ablation 10/22/08 (J. Allred).   Presence of permanent cardiac pacemaker    PVC's (premature ventricular contractions)    Sleep apnea    Uses nightly.  Questionalble: RDI during the total sleep time 6h 28 mins was 3.55/hr during REM sleep was at 5.71/hr. Supine AHI was 5.93/hr.   Special screening for malignant neoplasms, colon 10/01/2022   Syncope 08/2018   Past Surgical History:  Procedure Laterality  Date   ABDOMINAL HYSTERECTOMY     CARDIAC CATHETERIZATION  05/31/2010   The proximal LAD was then predilated with 2.0 x 12 trek. this was then stented with a 2.5 x 16 promus Element drug-eluting stent at 14 atmosphere and prostdilated with 2.75 x 12 [place] trek at 16 atmospheres(2.8 mm) resulting the reduction of 80% proximal LAD stenosis to 0% residual with excellent flow.   CARDIAC CATHETERIZATION  06/05/2010   Successful percutaneous coronary intervention to the right coronary artery with percutaneous transluminal coronary angioplasty/stenting and insertion 3.0 x 16 mm Promus DES post dilated to 3.35 mm with stenosis being reduced to 0%   CARDIAC CATHETERIZATION  02/29/2008   Post dilatation was performed using a 2.75 x 9 Ada sprinter, 10 atmospheres for 40 seconds and then 9 atmosphere for 35 sec. This resulted in the 80% area of narrowing pre-intervention, now appearing to normal. There was no edvidence of the dissection or thrombus and there was TIMI III flow pre and post.   CARDIAC CATHETERIZATION  02/28/2006   COLONOSCOPY WITH PROPOFOL  N/A 10/01/2022   Procedure: COLONOSCOPY WITH PROPOFOL ;  Surgeon: Eartha Angelia Sieving, MD;  [facility]: [facility];  Service: Gastroenterology;  Laterality: N/A;  8:00am;asa 3   CORONARY  ANGIOPLASTY WITH STENT PLACEMENT     3 stents/ 4 caths   DISTAL BICEPS TENDON REPAIR Left 02/12/2022   Procedure: LEFT DISTAL BICEPS TENDON REPAIR;  Surgeon: Jerri Kay HERO, MD;  Location: [facility] OR;  Service: Orthopedics;  Laterality: Left;   DISTAL BICEPS TENDON REPAIR Left 03/03/2022   Procedure: LEFT DISTAL BICEPS TENDON REPAIR;  Surgeon: Jerri Kay HERO, MD;  Location: [facility] OR;  Service: Orthopedics;  Laterality: Left;   EYE SURGERY     FINGER ARTHROPLASTY Right 09/02/2021   Procedure: RIGHT THUMB CARPOMETACARPAL ARTHROPLASTY;  Surgeon: Jerri Kay HERO, MD;  Location: [facility];  Service: Orthopedics;  Laterality: Right;  block in preop   INSERT / REPLACE /  REMOVE PACEMAKER  05/01/2019   KNEE ARTHROSCOPY     right   LASIK     LEFT HEART CATHETERIZATION WITH CORONARY ANGIOGRAM N/A 06/29/2013   Procedure: LEFT HEART CATHETERIZATION WITH CORONARY ANGIOGRAM;  Surgeon: Alm LELON Clay, MD;  Location: [facility] CATH LAB;  Service: Cardiovascular;  Laterality: N/A;   NASAL SINUS SURGERY  06/01/1999   NECK SURGERY     Cervical fusion   PACEMAKER IMPLANT N/A 05/01/2019   Procedure: PACEMAKER IMPLANT;  Surgeon: Kelsie Agent, MD;  Location: [facility] INVASIVE CV LAB;  Service: Cardiovascular;  Laterality: N/A;   RADIOFREQUENCY ABLATION  09/28/2008   atrial fibrillation   ROTATOR CUFF REPAIR     left   SHOULDER OPEN ROTATOR CUFF REPAIR     right   SPINE SURGERY     WRIST ARTHROCENTESIS     left   Current Outpatient Medications on File Prior to Visit  Medication Sig Dispense Refill   Accu-Chek FastClix Lancets MISC Check blood sugar 2 (two) times daily as directed 102 each 5   acetaminophen  (TYLENOL ) 325 MG tablet Take 1-2 tablets (325-650 mg total) by mouth every 4 (four) hours as needed for mild pain.     amLODipine  (NORVASC ) 5 MG tablet Take 5 mg by mouth daily.     aspirin  EC 81 MG tablet Take 81 mg by mouth at bedtime.     Atogepant  (QULIPTA ) 60 MG TABS Take 1 tablet (60 mg total) by mouth daily. 90 tablet 3   Blood Glucose Monitoring Suppl (ACCU-CHEK AVIVA PLUS) w/Device KIT Check BS bid E11.9 1 kit 1   cholecalciferol  (VITAMIN D ) 1000 UNITS tablet Take 1,000 Units by mouth at bedtime.      citalopram  (CELEXA ) 40 MG tablet Take 1 tablet (40 mg total) by mouth daily. 90 tablet 1   Coenzyme Q10 (CO Q 10) 100 MG CAPS Take 100 mg by mouth daily with breakfast.      empagliflozin  (JARDIANCE ) 25 MG TABS tablet Take 1 tablet (25 mg total) by mouth daily before breakfast. 90 tablet 3   fexofenadine (ALLEGRA) 180 MG tablet Take 180 mg by mouth daily with breakfast.      fluticasone  (FLONASE ) 50 MCG/ACT nasal spray INSTILL 2 SPRAYS IN THE  NOSE AT BEDTIME 48 g 3    fluticasone  (FLONASE ) 50 MCG/ACT nasal spray Place 2 sprays into both nostrils daily. 16 g 6   furosemide  (LASIX ) 40 MG tablet Take 1 tablet (40 mg total) by mouth daily. 90 tablet 3   glucose blood test strip Check blood sugar 2 (two) times daily. 100 each 12   Lancets Misc. (ACCU-CHEK FASTCLIX LANCET) KIT USE AS DIRECTED TO CHECK BLOOD SUGAR TWICE DAILY 1 kit 1   metroNIDAZOLE  (METROCREAM ) 0.75 % cream Apply topically 2 (two)  times daily to forehead, nose and cheeks 45 g 3   Multiple Vitamin (MULTIVITAMIN WITH MINERALS) TABS tablet Take 1 tablet by mouth daily with breakfast.     nitroGLYCERIN  (NITROSTAT ) 0.4 MG SL tablet PLACE ONE TABLET UNDER THE TONGUE EVERY 5 MINUTES AS NEEDED FOR CHEST PAIN 25 tablet 3   oxyCODONE -acetaminophen  (PERCOCET) 10-325 MG tablet Take 1 tablet by mouth every 8 (eight) hours as needed for pain. 21 tablet 0   pantoprazole  (PROTONIX ) 40 MG tablet Take 1 tablet (40 mg total) by mouth 2 (two) times daily. 180 tablet 1   Polyvinyl Alcohol-Povidone (REFRESH OP) Place 1 drop into both eyes at bedtime as needed (dry eyes).     potassium chloride  (MICRO-K ) 10 MEQ CR capsule Take 1 capsule (10 mEq total) by mouth daily. 90 capsule 3   ranolazine  (RANEXA ) 500 MG 12 hr tablet Take 1 tablet (500 mg total) by mouth 2 (two) times daily. 180 tablet 3   Rimegepant Sulfate  75 MG TBDP Take 1 tablet (75 mg total) by mouth daily as needed for migraine. 16 tablet 11   rosuvastatin  (CRESTOR ) 40 MG tablet Take 1 tablet (40 mg total) by mouth daily. 90 tablet 2   telmisartan  (MICARDIS ) 40 MG tablet Take 1 tablet (40 mg total) by mouth daily. 90 tablet 2   No current facility-administered medications on file prior to visit.   Allergies  Allergen Reactions   Ibuprofen Swelling    Whole body swelled   Isosorbide  Other (See Comments)    migraines   Other Shortness Of Breath    BETA BLOCKERS   Topamax  [Topiramate ] Other (See Comments)    Pt states it made his tongue feel scalded and he  couldn't eat    Toprol Xl [Metoprolol Succinate] Other (See Comments)    Personality change   Metoprolol-Hctz Er Other (See Comments)    Indigestion,mouth raw   Social History   Socioeconomic History   Marital status: Married    Spouse name: Nena   Number of children: 2   Years of education: Not on file   Highest education level: 12th grade  Occupational History   Occupation: retired  Tobacco Use   Smoking status: Former    Current packs/day: 0.00    Average packs/day: 2.0 packs/day for 25.0 years (50.0 ttl pk-yrs)    Types: Cigarettes, Cigars    Start date: 04/23/1963    Quit date: 04/22/1988    Years since quitting: 35.8    Passive exposure: Past   Smokeless tobacco: Never  Vaping Use   Vaping status: Never Used  Substance and Sexual Activity   Alcohol use: Not Currently   Drug use: No   Sexual activity: Not Currently    Birth control/protection: Surgical    Comment: vas  Other Topics Concern   Not on file  Social History Narrative   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   Currently works part time for [employer].   2 daughters.    Married x 43 years 09/2021.   Caffiene:  1 cup daily, 2 diet drinks daily. (Caffiene free most time).    Social Drivers of Corporate investment banker Strain: Low Risk  (02/15/2024)   Overall Financial Resource Strain (CARDIA)    Difficulty of Paying Living Expenses: Not hard at all  Food Insecurity: No Food Insecurity (02/15/2024)   Hunger Vital Sign    Worried About Running Out of Food in the Last Year: Never true  Ran Out of Food in the Last Year: Never true  Transportation Needs: No Transportation Needs (02/15/2024)   PRAPARE - Administrator, Civil Service (Medical): No    Lack of Transportation (Non-Medical): No  Physical Activity: Insufficiently Active (02/15/2024)   Exercise Vital Sign    Days of Exercise per Week: 3 days    Minutes of Exercise per Session: 30 min  Stress: No Stress  Concern Present (02/15/2024)   [employer] of Occupational Health - Occupational Stress Questionnaire    Feeling of Stress: Only a little  Social Connections: Socially Integrated (02/15/2024)   Social Connection and Isolation Panel    Frequency of Communication with Friends and Family: More than three times a week    Frequency of Social Gatherings with Friends and Family: More than three times a week    Attends Religious Services: More than 4 times per year    Active Member of [employer] or Organizations: Yes    Attends Engineer, structural: More than 4 times per year    Marital Status: Married  Catering manager Violence: Not At Risk (10/27/2023)   Humiliation, Afraid, Rape, and Kick questionnaire    Fear of Current or Ex-Partner: No    Emotionally Abused: No    Physically Abused: No    Sexually Abused: No   Family History  Problem Relation Age of Onset   Colon cancer Mother        mets from uterine   Uterine cancer Mother    Cancer Mother    Heart disease Father        and sister   Diabetes Father    Hearing loss Father    Stroke Father    Hypertension Father    Heart attack Father    Heart disease Sister    Lung cancer Sister    Stroke Brother    Hypertension Brother    Heart attack Brother    Migraines Neg Hx      Review of Systems  All other systems reviewed and are negative.      Objective:   Physical Exam Vitals reviewed.  Constitutional:      General: He is not in acute distress.    Appearance: He is well-developed. He is not diaphoretic.  HENT:     Head: Normocephalic and atraumatic.     Right Ear: Ear canal and external ear normal. Tympanic membrane is retracted.     Left Ear: Ear canal and external ear normal. Tympanic membrane is retracted.     Nose: Nose normal.     Mouth/Throat:     Pharynx: No oropharyngeal exudate.  Eyes:     General: No scleral icterus.       Right eye: No discharge.        Left eye: No discharge.      Conjunctiva/sclera: Conjunctivae normal.     Pupils: Pupils are equal, round, and reactive to light.  Neck:     Thyroid : No thyromegaly.     Vascular: No JVD.     Trachea: No tracheal deviation.  Cardiovascular:     Rate and Rhythm: Normal rate and regular rhythm.     Heart sounds: Murmur heard.     No friction rub. No gallop.  Pulmonary:     Effort: Pulmonary effort is normal. No respiratory distress.     Breath sounds: Normal breath sounds. No stridor. No wheezing or rales.  Chest:     Chest wall: No tenderness.  Abdominal:     General: Bowel sounds are normal. There is no distension.     Palpations: Abdomen is soft. There is no mass.     Tenderness: There is no abdominal tenderness. There is no guarding or rebound.     Hernia: No hernia is present.  Musculoskeletal:        General: No tenderness. Normal range of motion.     Cervical back: Normal range of motion and neck supple.  Lymphadenopathy:     Cervical: No cervical adenopathy.  Skin:    General: Skin is warm.     Capillary Refill: Capillary refill takes less than 2 seconds.     Coloration: Skin is not pale.     Findings: No erythema or rash.  Neurological:     Mental Status: He is alert and oriented to person, place, and time.     Cranial Nerves: No cranial nerve deficit.     Sensory: No sensory deficit.     Motor: No abnormal muscle tone.     Coordination: Coordination normal.     Deep Tendon Reflexes: Reflexes normal.  Psychiatric:        Behavior: Behavior normal.        Thought Content: Thought content normal.        Judgment: Judgment normal.           Assessment & Plan:  Type 2 diabetes mellitus treated without insulin  (HCC) - Plan: Hemoglobin A1c, Comprehensive metabolic panel with GFR blood sugar still sound well-controlled.  I would like his fasting blood sugar less than 130 and his 2-hour postprandial sugar less than 160 and his A1c less than 6.5.  Patient elects to resume Mounjaro  5 mg subcu  weekly to prevent further weight gain from returning

## 2024-02-18 LAB — HEMOGLOBIN A1C
Hgb A1c MFr Bld: 5.7 % — ABNORMAL HIGH (ref <5.7)
Mean Plasma Glucose: 117 mg/dL
eAG (mmol/L): 6.5 mmol/L

## 2024-02-18 LAB — COMPREHENSIVE METABOLIC PANEL WITH GFR
AG Ratio: 1.8 (calc) (ref 1.0–2.5)
ALT: 17 U/L (ref 9–46)
AST: 15 U/L (ref 10–35)
Albumin: 4.2 g/dL (ref 3.6–5.1)
Alkaline phosphatase (APISO): 71 U/L (ref 35–144)
BUN/Creatinine Ratio: 16 (calc) (ref 6–22)
BUN: 11 mg/dL (ref 7–25)
CO2: 27 mmol/L (ref 20–32)
Calcium: 9.2 mg/dL (ref 8.6–10.3)
Chloride: 96 mmol/L — ABNORMAL LOW (ref 98–110)
Creat: 0.69 mg/dL — ABNORMAL LOW (ref 0.70–1.28)
Globulin: 2.3 g/dL (ref 1.9–3.7)
Glucose, Bld: 99 mg/dL (ref 65–99)
Potassium: 4.4 mmol/L (ref 3.5–5.3)
Sodium: 135 mmol/L (ref 135–146)
Total Bilirubin: 0.4 mg/dL (ref 0.2–1.2)
Total Protein: 6.5 g/dL (ref 6.1–8.1)
eGFR: 97 mL/min/1.73m2 (ref >=60)

## 2024-02-20 ENCOUNTER — Ambulatory Visit: Payer: Self-pay | Admitting: Family Medicine

## 2024-02-23 ENCOUNTER — Telehealth: Payer: Self-pay

## 2024-02-23 ENCOUNTER — Encounter: Payer: Self-pay | Admitting: Adult Health

## 2024-02-23 ENCOUNTER — Ambulatory Visit (INDEPENDENT_AMBULATORY_CARE_PROVIDER_SITE_OTHER): Payer: Medicare Other | Admitting: Adult Health

## 2024-02-23 VITALS — BP 124/77 | HR 83 | Ht 68.0 in | Wt 171.0 lb

## 2024-02-23 DIAGNOSIS — G43009 Migraine without aura, not intractable, without status migrainosus: Secondary | ICD-10-CM

## 2024-02-23 NOTE — Progress Notes (Signed)
 PATIENT: Lee [name] DOB: Jan 14, 1951  REASON FOR VISIT: follow up HISTORY FROM: patient PRIMARY NEUROLOGIST: Dr. Ines   Chief Complaint  Patient presents with   RM 4    Patient is here with his wife for migraine follow-up. He feels stable overall.     HISTORY OF PRESENT ILLNESS: Today 02/23/24:  Lee [name] is a 73 y.o. male with a history of migraine headaches. Returns today for follow-up.  He reports that he is having 3-4 headaches a week.  Tends to wake up with the headaches.  He reports he does have a diagnosis of obstructive sleep apnea.  This was followed by cardiology but now his cardiologist Dr. Burnard is no longer there.  He reports that he tried to establish with another cardiologist but they do not manage sleep apnea.  He reports that his primary care put in a referral for inspire device as he was complaining that he had a hard time tolerating the CPAP.  Upon further questioning it does not seem that he may work with him in regards to finding an appropriate mask.  Patient states that he would be willing to retry CPAP therapy.  Reports that he has also lost weight since he was originally tested.SABRA  He returns today for an evaluation.   Location:  around head 3-4 headaches a week migraines 1-2 every 4-6 months  Frequency:  Duration: varies could last 3-4 days  Aura: no Photophonia:no Phonophobia:yes  Nausea: no Vomiting: no Numbness: no Weakness: no Visual changes:no    Lee [name] is a 73 y.o. male with a history of migraine headaches. Returns today for follow-up. Reports that he is having almost daily headaches atleast 5 out of 7 days. Headaches are frontal and occipital area. No phonophobia but does photophobia. No aura.  Occasional nausea but no vomiting.  No sensory changes with his headaches.  He does report that he takes [place] powder almost daily.  May also take Tylenol  as well.  He does have Nurtec but does not use it regularly.  He does take  Qulipta  30 mg daily returns today for an evaluation.     11/08/22: Lee [name] is a 73 y.o. male who has been followed in this office for MIgraine headaches. Returns today for follow-up. Reports that Emgality  has reduced the migraines.  He reports that he has approximately 6 migraines a month.  But can have 4 dull headaches a week.  Some weeks he may not have any dull headaches.  He does tend to take over-the-counter medication Tylenol  ibuprofen daily or at least several times a week.  He has Nurtec but reports that it does not resolve the headache fairly quickly.  He has tried several medications in the past.  HISTORY Addendum: Patient is doing excellent on Emgality .  He is only having 4 migraines a month and less than 10 total headache days a month.  We will refill his Nurtec for acute management.4 migraine days a month. < 10 total headache days a month. Failed mitrex, Nurtec, maxalt   HPI:  Lee [name] is a 73 y.o. male here as requested by Duanne Butler [name], MD for migraines.  Past medical history second-degree Mobitz 2 AV block, heart block complete, symptomatic bradycardia, vasovagal syncope, depression, hypertensive heart disease, hypertension, paroxysmal A-fib, hyperlipidemia, morbid obesity, obstructive sleep apnea, CAD, COPD, depression, DM2, DJD, heart disease, MI, pacemaker, Afib s/p ablation, crdiac caths s/p stents, cervical fusion.  Reviewed notes from [facility],  he has tried and failed multiple medications, being treated with Aimovig  and Nurtec, using CPAP, they recommended weight loss and he has lost 40 pounds.   Here with wife who provides much information. Migraines for 10+ years, can start unilaterally but often start in the back of the head, pulsating/pounding/throbbing, photophobia/phonophobia, a quiet dark room helps, weather is a trigger, nausea, no vomiting, can last up to a week. He has tried a multitude of medications, Aimovig  works the best, no  significant side effects, emgality  worked better, only 4 migraine days a month and but 15 total headache days a month. Using cpap religiously. Baseline prior to [facility] was having > 12 migraine days a month and > 20 total headache days a month. Nurtec helps with the headaches as well. He is due to take aimovig  September 1st, will change to emgality . They can last 8 hours to 3 days-4 days. No aura or prodrome. No other focal neurologic deficits, associated symptoms, inciting events or modifiable factors.     Reviewed notes, labs and imaging from outside physicians, which showed: Reviewed notes and all prior medications as below:   Current and past medications: ANALGESICS: Anacin, Goody's, codeine, Combunox, Darvon, Excedrin, Kadian , Lidoderm  patch, Lorcet, Percocet, Tylenol , Ultram , Vicodin ANTI-MIGRAINE: Dhe nasal spray, Imitrex, Nurtec, maxalt HEART/BP: Coreg , propranolol, Toprol, Lotensin, Norvasc , atenolol, Cardizem  DECONGESTANT/ANTIHISTAMINE: Allegra, Benadryl, Clarinex, Dramamine Entex T, Flonase , Nasonex, Sudafed, Zyrtec ANTI-NAUSEANT: Dramamine NSAIDS: Advil, Aleve MUSCLE RELAXANTS:  ANTI-CONVULSANTS: Topamax , Depakote , Keppra STEROIDS: Hydrocortisone, prednisone  SLEEPING PILLS/TRANQUILIZERS: Ambien , Halcion , Tylenol  PM, Valium, Xanax  ANTI-DEPRESSANTS: Celexa  HERBAL: CoQ10 FIBROMYALGIA:  HORMONAL: OTHER: Emgality , Aimovig (contrindicated his BP is elevated and worse on aimovig  and also contraindicated due to constipation) PROCEDURES FOR HEADACHES: Botox   CT head: COMPARISON:  09/20/2018   FINDINGS: 05/03/2020 Brain: No acute infarct or hemorrhage. Lateral ventricles and midline structures are unremarkable. No acute extra-axial fluid collections. No mass effect.   Vascular: No hyperdense vessel or unexpected calcification.   Skull: Laceration left parietal convexity of the scalp. No underlying fracture. Remainder of the calvarium is unremarkable.   Sinuses/Orbits: No acute  finding.   Other: None.   IMPRESSION: 1. Scalp laceration at the left parietal convexity. 2. No acute intracranial process.     Electronically Signed   By: Ozell Daring M.D.   On: 05/03/2020 20:35   09/20/2018: MRI brain: FINDINGS: BRAIN: There is no acute infarct, acute hemorrhage or extra-axial collection. The midline structures are normal. No midline shift or other mass effect. The white matter signal is normal for the patient's age. The cerebral and cerebellar volume are age-appropriate. No hydrocephalus. Susceptibility-sensitive sequences show no chronic microhemorrhage or superficial siderosis. No abnormal contrast enhancement.   VASCULAR: The major intracranial arterial and venous sinus flow voids are normal.   SKULL AND UPPER CERVICAL SPINE: Calvarial bone marrow signal is normal. There is no skull base mass. Visualized upper cervical spine and soft tissues are normal.   SINUSES/ORBITS: No fluid levels or advanced mucosal thickening. No mastoid or middle ear effusion. The orbits are normal.   IMPRESSION: Normal aging brain.       Latest Ref Rng & Units 12/07/2021    2:31 PM 08/25/2021    2:23 PM 06/26/2021    8:04 AM  CMP  Glucose 65 - 99 mg/dL 813  89  867   BUN 7 - 25 mg/dL 13  15  16    Creatinine 0.70 - 1.28 mg/dL 9.10  9.17  9.16   Sodium 135 - 146 mmol/L 136  135  136   Potassium 3.5 - 5.3 mmol/L 4.5  4.6  4.6   Chloride 98 - 110 mmol/L 100  100  99   CO2 20 - 32 mmol/L 27  28  29    Calcium  8.6 - 10.3 mg/dL 9.3  9.5  9.8   Total Protein 6.1 - 8.1 g/dL 6.6    6.9   Total Bilirubin 0.2 - 1.2 mg/dL 0.4    0.6   AST 10 - 35 U/L 14    23   ALT 9 - 46 U/L 13    20     REVIEW OF SYSTEMS: Out of a complete 14 system review of symptoms, the patient complains only of the following symptoms, and all other reviewed systems are negative.  ALLERGIES: Allergies  Allergen Reactions   Ibuprofen Swelling    Whole body swelled   Isosorbide  Other (See Comments)     migraines   Other Shortness Of Breath    BETA BLOCKERS   Topamax  [Topiramate ] Other (See Comments)    Pt states it made his tongue feel scalded and he couldn't eat    Toprol Xl [Metoprolol Succinate] Other (See Comments)    Personality change   Metoprolol-Hctz Er Other (See Comments)    Indigestion,mouth raw    HOME MEDICATIONS: Outpatient Medications Prior to Visit  Medication Sig Dispense Refill   Accu-Chek FastClix Lancets MISC Check blood sugar 2 (two) times daily as directed 102 each 5   acetaminophen  (TYLENOL ) 325 MG tablet Take 1-2 tablets (325-650 mg total) by mouth every 4 (four) hours as needed for mild pain.     amLODipine  (NORVASC ) 5 MG tablet Take 5 mg by mouth daily.     aspirin  EC 81 MG tablet Take 81 mg by mouth at bedtime.     Atogepant  (QULIPTA ) 60 MG TABS Take 1 tablet (60 mg total) by mouth daily. 90 tablet 3   Blood Glucose Monitoring Suppl (ACCU-CHEK AVIVA PLUS) w/Device KIT Check BS bid E11.9 1 kit 1   cholecalciferol  (VITAMIN D ) 1000 UNITS tablet Take 1,000 Units by mouth at bedtime.      citalopram  (CELEXA ) 40 MG tablet Take 1 tablet (40 mg total) by mouth daily. 90 tablet 1   Coenzyme Q10 (CO Q 10) 100 MG CAPS Take 100 mg by mouth daily with breakfast.      empagliflozin  (JARDIANCE ) 25 MG TABS tablet Take 1 tablet (25 mg total) by mouth daily before breakfast. 90 tablet 3   fexofenadine (ALLEGRA) 180 MG tablet Take 180 mg by mouth daily with breakfast.      fluticasone  (FLONASE ) 50 MCG/ACT nasal spray INSTILL 2 SPRAYS IN THE  NOSE AT BEDTIME 48 g 3   fluticasone  (FLONASE ) 50 MCG/ACT nasal spray Place 2 sprays into both nostrils daily. 16 g 6   furosemide  (LASIX ) 40 MG tablet Take 1 tablet (40 mg total) by mouth daily. 90 tablet 3   glucose blood test strip Check blood sugar 2 (two) times daily. 100 each 12   Lancets Misc. (ACCU-CHEK FASTCLIX LANCET) KIT USE AS DIRECTED TO CHECK BLOOD SUGAR TWICE DAILY 1 kit 1   Multiple Vitamin (MULTIVITAMIN WITH MINERALS)  TABS tablet Take 1 tablet by mouth daily with breakfast.     nitroGLYCERIN  (NITROSTAT ) 0.4 MG SL tablet PLACE ONE TABLET UNDER THE TONGUE EVERY 5 MINUTES AS NEEDED FOR CHEST PAIN 25 tablet 3   oxyCODONE -acetaminophen  (PERCOCET) 10-325 MG tablet Take 1 tablet by mouth every 8 (eight) hours as needed for pain.  21 tablet 0   pantoprazole  (PROTONIX ) 40 MG tablet Take 1 tablet (40 mg total) by mouth 2 (two) times daily. 180 tablet 1   Polyvinyl Alcohol-Povidone (REFRESH OP) Place 1 drop into both eyes at bedtime as needed (dry eyes).     potassium chloride  (MICRO-K ) 10 MEQ CR capsule Take 1 capsule (10 mEq total) by mouth daily. 90 capsule 3   ranolazine  (RANEXA ) 500 MG 12 hr tablet Take 1 tablet (500 mg total) by mouth 2 (two) times daily. 180 tablet 3   Rimegepant Sulfate  75 MG TBDP Take 1 tablet (75 mg total) by mouth daily as needed for migraine. 16 tablet 11   rosuvastatin  (CRESTOR ) 40 MG tablet Take 1 tablet (40 mg total) by mouth daily. 90 tablet 2   telmisartan  (MICARDIS ) 40 MG tablet Take 1 tablet (40 mg total) by mouth daily. 90 tablet 2   tirzepatide  (MOUNJARO ) 5 MG/0.5ML Pen Inject 5 mg into the skin once a week. 6 mL 3   metroNIDAZOLE  (METROCREAM ) 0.75 % cream Apply topically 2 (two) times daily to forehead, nose and cheeks (Patient not taking: Reported on 02/23/2024) 45 g 3   No facility-administered medications prior to visit.    PAST MEDICAL HISTORY: Past Medical History:  Diagnosis Date   Allergic rhinitis    chronic sinusitis   Allergy    Arrhythmia    Arthritis    osetoarthritis   CAD (coronary artery disease)    a. 2009 PCI/DES to mLAD; b. 05/2010 s/p DES RCA and LAD.; c. 05/2013 Cath: LAD mild ISR, LCX nl, RCA 40p, patent stents.   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   Cataract    has had lasik surg   COPD (chronic obstructive pulmonary disease) (HCC)    Depression    Diabetes mellitus type 2, controlled, without complications (HCC)    Diabetes  mellitus without complication (HCC)    Phreesia 08/22/2020   Diverticulosis    DJD (degenerative joint disease)    Esophagitis 1991   grade 1   GERD (gastroesophageal reflux disease)    Hiatal hernia   Hearing loss    wears bilateral hearing aids   Heart murmur    Dr Duanne dx per patient   Hemorrhoids    Hyperlipidemia    Hypertension    Hypertensive heart disease    Iron deficiency anemia    Migraines    Myocardial infarction [facility])    Paroxysmal atrial fibrillation (HCC)    a. s/p afib ablation 10/22/08 (J. Allred).   Presence of permanent cardiac pacemaker    PVC's (premature ventricular contractions)    Sleep apnea    Uses nightly.  Questionalble: RDI during the total sleep time 6h 28 mins was 3.55/hr during REM sleep was at 5.71/hr. Supine AHI was 5.93/hr.   Special screening for malignant neoplasms, colon 10/01/2022   Syncope 08/2018    PAST SURGICAL HISTORY: Past Surgical History:  Procedure Laterality Date   ABDOMINAL HYSTERECTOMY     CARDIAC CATHETERIZATION  05/31/2010   The proximal LAD was then predilated with 2.0 x 12 trek. this was then stented with a 2.5 x 16 promus Element drug-eluting stent at 14 atmosphere and prostdilated with 2.75 x 12 [place] trek at 16 atmospheres(2.8 mm) resulting the reduction of 80% proximal LAD stenosis to 0% residual with excellent flow.   CARDIAC CATHETERIZATION  06/05/2010   Successful percutaneous coronary intervention to the right coronary artery with percutaneous transluminal coronary angioplasty/stenting  and insertion 3.0 x 16 mm Promus DES post dilated to 3.35 mm with stenosis being reduced to 0%   CARDIAC CATHETERIZATION  02/29/2008   Post dilatation was performed using a 2.75 x 9 [place] sprinter, 10 atmospheres for 40 seconds and then 9 atmosphere for 35 sec. This resulted in the 80% area of narrowing pre-intervention, now appearing to normal. There was no edvidence of the dissection or thrombus and there was TIMI III flow pre and post.    CARDIAC CATHETERIZATION  02/28/2006   COLONOSCOPY WITH PROPOFOL  N/A 10/01/2022   Procedure: COLONOSCOPY WITH PROPOFOL ;  Surgeon: Eartha Angelia Sieving, MD;  [facility]: [facility];  Service: Gastroenterology;  Laterality: N/A;  8:00am;asa 3   CORONARY ANGIOPLASTY WITH STENT PLACEMENT     3 stents/ 4 caths   DISTAL BICEPS TENDON REPAIR Left 02/12/2022   Procedure: LEFT DISTAL BICEPS TENDON REPAIR;  Surgeon: Jerri Kay HERO, MD;  Location: [facility] OR;  Service: Orthopedics;  Laterality: Left;   DISTAL BICEPS TENDON REPAIR Left 03/03/2022   Procedure: LEFT DISTAL BICEPS TENDON REPAIR;  Surgeon: Jerri Kay HERO, MD;  Location: [facility] OR;  Service: Orthopedics;  Laterality: Left;   EYE SURGERY     FINGER ARTHROPLASTY Right 09/02/2021   Procedure: RIGHT THUMB CARPOMETACARPAL ARTHROPLASTY;  Surgeon: Jerri Kay HERO, MD;  Location: [facility];  Service: Orthopedics;  Laterality: Right;  block in preop   INSERT / REPLACE / REMOVE PACEMAKER  05/01/2019   KNEE ARTHROSCOPY     right   LASIK     LEFT HEART CATHETERIZATION WITH CORONARY ANGIOGRAM N/A 06/29/2013   Procedure: LEFT HEART CATHETERIZATION WITH CORONARY ANGIOGRAM;  Surgeon: Alm LELON Clay, MD;  Location[facility] CATH LAB;  Service: Cardiovascular;  Laterality: N/A;   NASAL SINUS SURGERY  06/01/1999   NECK SURGERY     Cervical fusion   PACEMAKER IMPLANT N/A 05/01/2019   Procedure: PACEMAKER IMPLANT;  Surgeon: Kelsie Agent, MD;  Location: [facility] INVASIVE CV LAB;  Service: Cardiovascular;  Laterality: N/A;   RADIOFREQUENCY ABLATION  09/28/2008   atrial fibrillation   ROTATOR CUFF REPAIR     left   SHOULDER OPEN ROTATOR CUFF REPAIR     right   SPINE SURGERY     WRIST ARTHROCENTESIS     left    FAMILY HISTORY: Family History  Problem Relation Age of Onset   Colon cancer Mother        mets from uterine   Uterine cancer Mother    Cancer Mother    Heart disease Father        and sister   Diabetes Father    Hearing loss Father     Stroke Father    Hypertension Father    Heart attack Father    Heart disease Sister    Lung cancer Sister    Stroke Brother    Hypertension Brother    Heart attack Brother    Migraines Neg Hx     SOCIAL HISTORY: Social History   Socioeconomic History   Marital status: Married    Spouse name: Nena   Number of children: 2   Years of education: Not on file   Highest education level: 12th grade  Occupational History   Occupation: retired  Tobacco Use   Smoking status: Former    Current packs/day: 0.00    Average packs/day: 2.0 packs/day for 25.0 years (50.0 ttl pk-yrs)    Types: Cigarettes, Cigars    Start date: 04/23/1963  Quit date: 04/22/1988    Years since quitting: 35.8    Passive exposure: Past   Smokeless tobacco: Never  Vaping Use   Vaping status: Never Used  Substance and Sexual Activity   Alcohol use: Not Currently   Drug use: No   Sexual activity: Not Currently    Birth control/protection: Surgical    Comment: vas  Other Topics Concern   Not on file  Social History Narrative   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   Currently works part time for [employer].   2 daughters.    Married x 43 years 09/2021.   Caffiene:  1 cup daily, 2 diet drinks daily. (Caffiene free most time).    Social Drivers of Corporate investment banker Strain: Low Risk  (02/15/2024)   Overall Financial Resource Strain (CARDIA)    Difficulty of Paying Living Expenses: Not hard at all  Food Insecurity: No Food Insecurity (02/15/2024)   Hunger Vital Sign    Worried About Running Out of Food in the Last Year: Never true    Ran Out of Food in the Last Year: Never true  Transportation Needs: No Transportation Needs (02/15/2024)   PRAPARE - Administrator, Civil Service (Medical): No    Lack of Transportation (Non-Medical): No  Physical Activity: Insufficiently Active (02/15/2024)   Exercise Vital Sign    Days of Exercise per Week: 3 days     Minutes of Exercise per Session: 30 min  Stress: No Stress Concern Present (02/15/2024)   [employer] of Occupational Health - Occupational Stress Questionnaire    Feeling of Stress: Only a little  Social Connections: Socially Integrated (02/15/2024)   Social Connection and Isolation Panel    Frequency of Communication with Friends and Family: More than three times a week    Frequency of Social Gatherings with Friends and Family: More than three times a week    Attends Religious Services: More than 4 times per year    Active Member of [employer] or Organizations: Yes    Attends Engineer, structural: More than 4 times per year    Marital Status: Married  Catering manager Violence: Not At Risk (10/27/2023)   Humiliation, Afraid, Rape, and Kick questionnaire    Fear of Current or Ex-Partner: No    Emotionally Abused: No    Physically Abused: No    Sexually Abused: No      PHYSICAL EXAM  Vitals:   02/23/24 0947  BP: 124/77  Pulse: 83  Weight: 171 lb (77.6 kg)  Height: 5' 8 (1.727 m)    Body mass index is 26 kg/m.  Generalized: Well developed, in no acute distress   Neurological examination  Mentation: Alert oriented to time, place, history taking. Follows all commands speech and language fluent Cranial nerve II-XII: Pupils were equal round reactive to light. Extraocular movements were full, visual field were full on confrontational test. Facial sensation and strength were normal.  Head turning and shoulder shrug  were normal and symmetric. Motor: The motor testing reveals 5 over 5 strength of all 4 extremities. Good symmetric motor tone is noted throughout.  Sensory: Sensory testing is intact to soft touch on all 4 extremities. No evidence of extinction is noted.  Coordination: Cerebellar testing reveals good finger-nose-finger and heel-to-shin bilaterally.  Gait and station: Gait is normal. DIAGNOSTIC DATA (LABS, IMAGING, TESTING) - I reviewed patient records, labs,  notes, testing and imaging myself where available.  Lab  Results  Component Value Date   WBC 7.1 06/22/2023   HGB 15.2 06/22/2023   HCT 45.9 06/22/2023   MCV 90.5 06/22/2023   PLT 266 06/22/2023      Component Value Date/Time   NA 135 02/17/2024 1228   K 4.4 02/17/2024 1228   CL 96 (L) 02/17/2024 1228   CO2 27 02/17/2024 1228   GLUCOSE 99 02/17/2024 1228   BUN 11 02/17/2024 1228   CREATININE 0.69 (L) 02/17/2024 1228   CALCIUM  9.2 02/17/2024 1228   PROT 6.5 02/17/2024 1228   ALBUMIN 3.9 05/01/2019 0358   AST 15 02/17/2024 1228   ALT 17 02/17/2024 1228   ALKPHOS 71 05/01/2019 0358   BILITOT 0.4 02/17/2024 1228   GFRNONAA >60 10/01/2022 0721   GFRNONAA 88 08/18/2020 0803   GFRAA 102 08/18/2020 0803   Lab Results  Component Value Date   CHOL 130 06/22/2023   HDL 57 06/22/2023   LDLCALC 60 06/22/2023   TRIG 53 06/22/2023   CHOLHDL 2.3 06/22/2023   Lab Results  Component Value Date   HGBA1C 5.7 (H) 02/17/2024   Lab Results  Component Value Date   VITAMINB12 601 06/22/2023   Lab Results  Component Value Date   TSH 1.200 05/01/2019      ASSESSMENT AND PLAN 73 y.o. year old male  has a past medical history of Allergic rhinitis, Allergy, Arrhythmia, Arthritis, CAD (coronary artery disease), Cancer (HCC) (02/24/2022), Cataract, COPD (chronic obstructive pulmonary disease) (HCC), Depression, Diabetes mellitus type 2, controlled, without complications (HCC), Diabetes mellitus without complication (HCC), Diverticulosis, DJD (degenerative joint disease), Esophagitis (1991), GERD (gastroesophageal reflux disease), Hearing loss, Heart murmur, Hemorrhoids, Hyperlipidemia, Hypertension, Hypertensive heart disease, Iron deficiency anemia, Migraines, Myocardial infarction (HCC), Paroxysmal atrial fibrillation (HCC), Presence of permanent cardiac pacemaker, PVC's (premature ventricular contractions), Sleep apnea, Special screening for malignant neoplasms, colon (10/01/2022), and  Syncope (08/2018). here with:  1.  Migraine headaches   I continue Qulipta  60 mg daily Use Nurtec 75 mg for abortive therapy.  Only 1 tablet in 24 hour Cautioned about using over-the-counter medication Certainly untreated sleep apnea could be causing his headaches.   Advised that I would talk to the sleep lab about managing his sleep apnea. Advised to call if headaches do not improve Follow-up in 6 to 8 months or sooner if needed   Duwaine Russell, MSN, NP-C 02/23/2024, 10:00 AM [facility] [address] [place], [place] [address] [phone]

## 2024-02-23 NOTE — Telephone Encounter (Signed)
 Copied from CRM [id number]. Topic: Referral - Question >> Feb 23, 2024 10:29 AM Willma SAUNDERS wrote: Reason for CRM: Heather is requesting to speak with the office in regards to a referral they received for a patient.   Heather can be reached at [phone]

## 2024-02-24 ENCOUNTER — Ambulatory Visit (INDEPENDENT_AMBULATORY_CARE_PROVIDER_SITE_OTHER): Payer: Medicare Other

## 2024-02-24 ENCOUNTER — Other Ambulatory Visit (HOSPITAL_COMMUNITY): Payer: Self-pay

## 2024-02-24 DIAGNOSIS — I442 Atrioventricular block, complete: Secondary | ICD-10-CM

## 2024-02-24 LAB — CUP PACEART REMOTE DEVICE CHECK
Battery Remaining Longevity: 36 mo
Battery Remaining Percentage: 49 %
Battery Voltage: 2.98 V
Brady Statistic AP VP Percent: 1 %
Brady Statistic AP VS Percent: 1 %
Brady Statistic AS VP Percent: 99 %
Brady Statistic AS VS Percent: 1 %
Brady Statistic RA Percent Paced: 1 %
Brady Statistic RV Percent Paced: 99 %
Date Time Interrogation Session: 20250926020013
Implantable Lead Connection Status: 753985
Implantable Lead Connection Status: 753985
Implantable Lead Implant Date: 20201201
Implantable Lead Implant Date: 20201201
Implantable Lead Location: 753859
Implantable Lead Location: 753860
Implantable Pulse Generator Implant Date: 20201201
Lead Channel Impedance Value: 430 Ohm
Lead Channel Impedance Value: 440 Ohm
Lead Channel Pacing Threshold Amplitude: 0.75 V
Lead Channel Pacing Threshold Amplitude: 1.5 V
Lead Channel Pacing Threshold Pulse Width: 0.5 ms
Lead Channel Pacing Threshold Pulse Width: 0.5 ms
Lead Channel Sensing Intrinsic Amplitude: 1.6 mV
Lead Channel Sensing Intrinsic Amplitude: 3.5 mV
Lead Channel Setting Pacing Amplitude: 2 V
Lead Channel Setting Pacing Amplitude: 3 V
Lead Channel Setting Pacing Pulse Width: 0.5 ms
Lead Channel Setting Sensing Sensitivity: 0.5 mV
Pulse Gen Model: 2272
Pulse Gen Serial Number: 9182765

## 2024-02-27 ENCOUNTER — Ambulatory Visit: Payer: Self-pay | Admitting: Cardiology

## 2024-02-29 NOTE — Progress Notes (Signed)
 Remote PPM Transmission

## 2024-03-12 ENCOUNTER — Encounter: Payer: Self-pay | Admitting: Adult Health

## 2024-03-12 ENCOUNTER — Encounter: Payer: Self-pay | Admitting: Family Medicine

## 2024-03-12 ENCOUNTER — Other Ambulatory Visit: Payer: Self-pay | Admitting: Family Medicine

## 2024-03-12 DIAGNOSIS — G4733 Obstructive sleep apnea (adult) (pediatric): Secondary | ICD-10-CM

## 2024-03-12 NOTE — Progress Notes (Signed)
 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  He was initially referred to the sleep lab by his PCP for placement of inspire device and the referral was denied.  Dr. Buck advised that PCP should send another referral if he wants to establish care for CPAP.

## 2024-03-12 NOTE — Telephone Encounter (Signed)
 I called the patient and discussed. He still intends to retry cpap. He understands our sleep lab would not accept a referral for inspire interest. The patient will also call his pcp to let him know we need a new referral stating his intentions to restart cpap and not for inspire. He was appreciative for the call.

## 2024-03-14 ENCOUNTER — Encounter (INDEPENDENT_AMBULATORY_CARE_PROVIDER_SITE_OTHER): Payer: Self-pay | Admitting: Gastroenterology

## 2024-03-18 ENCOUNTER — Other Ambulatory Visit: Payer: Self-pay | Admitting: Family Medicine

## 2024-03-18 DIAGNOSIS — I480 Paroxysmal atrial fibrillation: Secondary | ICD-10-CM

## 2024-03-18 DIAGNOSIS — I2510 Atherosclerotic heart disease of native coronary artery without angina pectoris: Secondary | ICD-10-CM

## 2024-03-18 DIAGNOSIS — R002 Palpitations: Secondary | ICD-10-CM

## 2024-03-19 ENCOUNTER — Other Ambulatory Visit (HOSPITAL_COMMUNITY): Payer: Self-pay

## 2024-03-19 ENCOUNTER — Other Ambulatory Visit: Payer: Self-pay

## 2024-03-19 ENCOUNTER — Other Ambulatory Visit: Payer: Self-pay | Admitting: Nurse Practitioner

## 2024-03-19 MED ORDER — NITROGLYCERIN 0.4 MG SL SUBL
SUBLINGUAL_TABLET | SUBLINGUAL | 3 refills | Status: AC
  Filled 2024-03-19: qty 25, 5d supply, fill #0
  Filled 2024-06-17: qty 25, 5d supply, fill #1

## 2024-03-20 ENCOUNTER — Other Ambulatory Visit: Payer: Self-pay

## 2024-03-20 ENCOUNTER — Encounter: Payer: Self-pay | Admitting: Family Medicine

## 2024-03-22 ENCOUNTER — Encounter (HOSPITAL_COMMUNITY): Payer: Self-pay

## 2024-03-22 ENCOUNTER — Other Ambulatory Visit (HOSPITAL_COMMUNITY): Payer: Self-pay

## 2024-03-23 ENCOUNTER — Encounter: Payer: Self-pay | Admitting: Adult Health

## 2024-03-23 ENCOUNTER — Other Ambulatory Visit (HOSPITAL_COMMUNITY): Payer: Self-pay

## 2024-03-23 ENCOUNTER — Other Ambulatory Visit: Payer: Self-pay | Admitting: Family Medicine

## 2024-03-23 MED ORDER — OXYCODONE-ACETAMINOPHEN 10-325 MG PO TABS
1.0000 | ORAL_TABLET | Freq: Three times a day (TID) | ORAL | 0 refills | Status: DC | PRN
  Filled 2024-03-23: qty 21, 7d supply, fill #0

## 2024-03-26 ENCOUNTER — Encounter

## 2024-03-27 ENCOUNTER — Encounter: Payer: Self-pay | Admitting: Podiatry

## 2024-03-27 ENCOUNTER — Ambulatory Visit (INDEPENDENT_AMBULATORY_CARE_PROVIDER_SITE_OTHER): Admitting: Podiatry

## 2024-03-27 DIAGNOSIS — D2372 Other benign neoplasm of skin of left lower limb, including hip: Secondary | ICD-10-CM | POA: Diagnosis not present

## 2024-03-27 DIAGNOSIS — D2371 Other benign neoplasm of skin of right lower limb, including hip: Secondary | ICD-10-CM | POA: Diagnosis not present

## 2024-03-27 NOTE — Progress Notes (Unsigned)
 Who presents today chief complaint of painful subfifth metatarsal lesions.  The left beneath the fifth metatarsal head the right beneath the mid diaphyseal region of the fifth met and the base of the fifth met.  States that they have been painful for quite some time but the acid really helps.  Objective: Vitals are stable alert and oriented x 3.  Pulses are palpable.  The aforementioned reactive hyperkeratotic lesions are punctated there is no erythema cellulitis drainage or odor.  Assessment: Pain in limb secondary to benign skin lesions.  Plan: Mechanical and chemical debridement with Salinocaine under occlusion to be left on 3 days before being removed washed thoroughly Follow-up with me on an as-needed basis

## 2024-04-09 ENCOUNTER — Ambulatory Visit (INDEPENDENT_AMBULATORY_CARE_PROVIDER_SITE_OTHER): Admitting: Family Medicine

## 2024-04-09 ENCOUNTER — Other Ambulatory Visit (HOSPITAL_COMMUNITY): Payer: Self-pay

## 2024-04-09 ENCOUNTER — Encounter: Payer: Self-pay | Admitting: Family Medicine

## 2024-04-09 VITALS — BP 150/90 | HR 83 | Temp 97.6°F | Ht 68.0 in | Wt 168.0 lb

## 2024-04-09 DIAGNOSIS — I10 Essential (primary) hypertension: Secondary | ICD-10-CM

## 2024-04-09 MED ORDER — AMLODIPINE BESYLATE 5 MG PO TABS
5.0000 mg | ORAL_TABLET | Freq: Every day | ORAL | 3 refills | Status: AC
  Filled 2024-04-09: qty 90, 90d supply, fill #0
  Filled 2024-07-04: qty 90, 90d supply, fill #1

## 2024-04-09 MED ORDER — RSV PRE-FUSION F A&B VAC RCMB 120 MCG/0.5ML IM SOLR
0.5000 mL | Freq: Once | INTRAMUSCULAR | 0 refills | Status: AC
  Filled 2024-04-09: qty 0.5, 1d supply, fill #0

## 2024-04-09 NOTE — Progress Notes (Signed)
 Subjective:    Patient ID: Lee [name], male    DOB: [birth year], 73 y.o.   MRN: [id number]  Earlier this year, cardiology discontinued his amlodipine  because his blood pressure was well-controlled.  Recently his blood pressure has been elevated at 150-160 systolic.  He is no longer taking his amlodipine . Past Medical History:  Diagnosis Date   Allergic rhinitis    chronic sinusitis   Allergy    Arrhythmia    Arthritis    osetoarthritis   CAD (coronary artery disease)    a. 2009 PCI/DES to mLAD; b. 05/2010 s/p DES RCA and LAD.; c. 05/2013 Cath: LAD mild ISR, LCX nl, RCA 40p, patent stents.   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   Cataract    has had lasik surg   COPD (chronic obstructive pulmonary disease) (HCC)    Depression    Diabetes mellitus type 2, controlled, without complications (HCC)    Diabetes mellitus without complication (HCC)    Phreesia 08/22/2020   Diverticulosis    DJD (degenerative joint disease)    Esophagitis 1991   grade 1   GERD (gastroesophageal reflux disease)    Hiatal hernia   Hearing loss    wears bilateral hearing aids   Heart murmur    Dr Duanne dx per patient   Hemorrhoids    Hyperlipidemia    Hypertension    Hypertensive heart disease    Iron deficiency anemia    Migraines    Myocardial infarction [facility])    Paroxysmal atrial fibrillation (HCC)    a. s/p afib ablation 10/22/08 ([facility]. [facility]).   Presence of permanent cardiac pacemaker    PVC's (premature ventricular contractions)    Sleep apnea    Uses nightly.  Questionalble: RDI during the total sleep time 6h 28 mins was 3.55/hr during REM sleep was at 5.71/hr. Supine AHI was 5.93/hr.   Special screening for malignant neoplasms, colon 10/01/2022   Syncope 08/2018   Past Surgical History:  Procedure Laterality Date   ABDOMINAL HYSTERECTOMY     CARDIAC CATHETERIZATION  05/31/2010   The proximal LAD was then predilated with 2.0 x 12 trek. this was then  stented with a 2.5 x 16 promus Element drug-eluting stent at 14 atmosphere and prostdilated with 2.75 x 12 [place] trek at 16 atmospheres(2.8 mm) resulting the reduction of 80% proximal LAD stenosis to 0% residual with excellent flow.   CARDIAC CATHETERIZATION  06/05/2010   Successful percutaneous coronary intervention to the right coronary artery with percutaneous transluminal coronary angioplasty/stenting and insertion 3.0 x 16 mm Promus DES post dilated to 3.35 mm with stenosis being reduced to 0%   CARDIAC CATHETERIZATION  02/29/2008   Post dilatation was performed using a 2.75 x 9 [place] sprinter, 10 atmospheres for 40 seconds and then 9 atmosphere for 35 sec. This resulted in the 80% area of narrowing pre-intervention, now appearing to normal. There was no edvidence of the dissection or thrombus and there was TIMI III flow pre and post.   CARDIAC CATHETERIZATION  02/28/2006   COLONOSCOPY WITH PROPOFOL  N/A 10/01/2022   Procedure: COLONOSCOPY WITH PROPOFOL ;  Surgeon: Eartha Angelia Sieving, MD;  Location: [facility];  Service: Gastroenterology;  Laterality: N/A;  8:00am;asa 3   CORONARY ANGIOPLASTY WITH STENT PLACEMENT     3 stents/ 4 caths   DISTAL BICEPS TENDON REPAIR Left 02/12/2022   Procedure: LEFT DISTAL BICEPS TENDON REPAIR;  Surgeon: Jerri Kay CHRISTELLA, MD;  Location:  [facility] OR;  Service: Orthopedics;  Laterality: Left;   DISTAL BICEPS TENDON REPAIR Left 03/03/2022   Procedure: LEFT DISTAL BICEPS TENDON REPAIR;  Surgeon: Jerri Kay HERO, MD;  Location: [facility] OR;  Service: Orthopedics;  Laterality: Left;   EYE SURGERY     FINGER ARTHROPLASTY Right 09/02/2021   Procedure: RIGHT THUMB CARPOMETACARPAL ARTHROPLASTY;  Surgeon: Jerri Kay HERO, MD;  Location: [facility];  Service: Orthopedics;  Laterality: Right;  block in preop   INSERT / REPLACE / REMOVE PACEMAKER  05/01/2019   KNEE ARTHROSCOPY     right   LASIK     LEFT HEART CATHETERIZATION WITH CORONARY ANGIOGRAM N/A 06/29/2013    Procedure: LEFT HEART CATHETERIZATION WITH CORONARY ANGIOGRAM;  Surgeon: Alm LELON Clay, MD;  Location: [facility] CATH LAB;  Service: Cardiovascular;  Laterality: N/A;   NASAL SINUS SURGERY  06/01/1999   NECK SURGERY     Cervical fusion   PACEMAKER IMPLANT N/A 05/01/2019   Procedure: PACEMAKER IMPLANT;  Surgeon: Kelsie Agent, MD;  Location: [facility] INVASIVE CV LAB;  Service: Cardiovascular;  Laterality: N/A;   RADIOFREQUENCY ABLATION  09/28/2008   atrial fibrillation   ROTATOR CUFF REPAIR     left   SHOULDER OPEN ROTATOR CUFF REPAIR     right   SPINE SURGERY     WRIST ARTHROCENTESIS     left   Current Outpatient Medications on File Prior to Visit  Medication Sig Dispense Refill   Accu-Chek FastClix Lancets MISC Check blood sugar 2 (two) times daily as directed 102 each 5   acetaminophen  (TYLENOL ) 325 MG tablet Take 1-2 tablets (325-650 mg total) by mouth every 4 (four) hours as needed for mild pain.     aspirin  EC 81 MG tablet Take 81 mg by mouth at bedtime.     Atogepant  (QULIPTA ) 60 MG TABS Take 1 tablet (60 mg total) by mouth daily. 90 tablet 3   Blood Glucose Monitoring Suppl (ACCU-CHEK AVIVA PLUS) w/Device KIT Check BS bid E11.9 1 kit 1   cholecalciferol  (VITAMIN D ) 1000 UNITS tablet Take 1,000 Units by mouth at bedtime.      citalopram  (CELEXA ) 40 MG tablet Take 1 tablet (40 mg total) by mouth daily. 90 tablet 1   Coenzyme Q10 (CO Q 10) 100 MG CAPS Take 100 mg by mouth daily with breakfast.      empagliflozin  (JARDIANCE ) 25 MG TABS tablet Take 1 tablet (25 mg total) by mouth daily before breakfast. 90 tablet 3   fexofenadine (ALLEGRA) 180 MG tablet Take 180 mg by mouth daily with breakfast.      fluticasone  (FLONASE ) 50 MCG/ACT nasal spray INSTILL 2 SPRAYS IN THE  NOSE AT BEDTIME 48 g 3   fluticasone  (FLONASE ) 50 MCG/ACT nasal spray Place 2 sprays into both nostrils daily. 16 g 6   furosemide  (LASIX ) 40 MG tablet Take 1 tablet (40 mg total) by mouth daily. 90 tablet 3   glucose blood test  strip Check blood sugar 2 (two) times daily. 100 each 12   Lancets Misc. (ACCU-CHEK FASTCLIX LANCET) KIT USE AS DIRECTED TO CHECK BLOOD SUGAR TWICE DAILY 1 kit 1   Multiple Vitamin (MULTIVITAMIN WITH MINERALS) TABS tablet Take 1 tablet by mouth daily with breakfast.     nitroGLYCERIN  (NITROSTAT ) 0.4 MG SL tablet PLACE ONE TABLET UNDER THE TONGUE EVERY 5 MINUTES AS NEEDED FOR CHEST PAIN 25 tablet 3   oxyCODONE -acetaminophen  (PERCOCET) 10-325 MG tablet Take 1 tablet by mouth every 8 (eight) hours as needed  for pain. 21 tablet 0   pantoprazole  (PROTONIX ) 40 MG tablet Take 1 tablet (40 mg total) by mouth 2 (two) times daily. 180 tablet 1   Polyvinyl Alcohol-Povidone (REFRESH OP) Place 1 drop into both eyes at bedtime as needed (dry eyes).     potassium chloride  (MICRO-K ) 10 MEQ CR capsule Take 1 capsule (10 mEq total) by mouth daily. 90 capsule 3   ranolazine  (RANEXA ) 500 MG 12 hr tablet Take 1 tablet (500 mg total) by mouth 2 (two) times daily. 180 tablet 3   Rimegepant Sulfate  75 MG TBDP Take 1 tablet (75 mg total) by mouth daily as needed for migraine. 16 tablet 11   rosuvastatin  (CRESTOR ) 40 MG tablet Take 1 tablet (40 mg total) by mouth daily. 90 tablet 2   telmisartan  (MICARDIS ) 40 MG tablet Take 1 tablet (40 mg total) by mouth daily. 90 tablet 2   tirzepatide  (MOUNJARO ) 5 MG/0.5ML Pen Inject 5 mg into the skin once a week. 6 mL 3   amLODipine  (NORVASC ) 5 MG tablet Take 5 mg by mouth daily. (Patient not taking: Reported on 04/09/2024)     metroNIDAZOLE  (METROCREAM ) 0.75 % cream Apply topically 2 (two) times daily to forehead, nose and cheeks (Patient not taking: Reported on 04/09/2024) 45 g 3   No current facility-administered medications on file prior to visit.   Allergies  Allergen Reactions   Ibuprofen Swelling    Whole body swelled   Isosorbide  Other (See Comments)    migraines   Other Shortness Of Breath    BETA BLOCKERS   Topamax  [Topiramate ] Other (See Comments)    Pt states it  made his tongue feel scalded and he couldn't eat    Toprol Xl [Metoprolol Succinate] Other (See Comments)    Personality change   Metoprolol-Hctz Er Other (See Comments)    Indigestion,mouth raw   Social History   Socioeconomic History   Marital status: Married    Spouse name: Nena   Number of children: 2   Years of education: Not on file   Highest education level: 12th grade  Occupational History   Occupation: retired  Tobacco Use   Smoking status: Former    Current packs/day: 0.00    Average packs/day: 2.0 packs/day for 25.0 years (50.0 ttl pk-yrs)    Types: Cigarettes, Cigars    Start date: 04/23/1963    Quit date: 04/22/1988    Years since quitting: 35.9    Passive exposure: Past   Smokeless tobacco: Never  Vaping Use   Vaping status: Never Used  Substance and Sexual Activity   Alcohol use: Not Currently   Drug use: No   Sexual activity: Not Currently    Birth control/protection: Surgical    Comment: vas  Other Topics Concern   Not on file  Social History Narrative   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   Currently works part time for [employer].   2 daughters.    Married x 43 years 09/2021.   Caffiene:  1 cup daily, 2 diet drinks daily. (Caffiene free most time).    Social Drivers of Corporate Investment Banker Strain: Low Risk  (04/09/2024)   Overall Financial Resource Strain (CARDIA)    Difficulty of Paying Living Expenses: Not hard at all  Food Insecurity: No Food Insecurity (04/09/2024)   Hunger Vital Sign    Worried About Running Out of Food in the Last Year: Never true    Ran Out of Food  in the Last Year: Never true  Transportation Needs: No Transportation Needs (04/09/2024)   PRAPARE - Administrator, Civil Service (Medical): No    Lack of Transportation (Non-Medical): No  Physical Activity: Insufficiently Active (04/09/2024)   Exercise Vital Sign    Days of Exercise per Week: 2 days    Minutes of Exercise  per Session: 30 min  Stress: No Stress Concern Present (04/09/2024)   [employer] of Occupational Health - Occupational Stress Questionnaire    Feeling of Stress: Only a little  Social Connections: Moderately Integrated (04/09/2024)   Social Connection and Isolation Panel    Frequency of Communication with Friends and Family: More than three times a week    Frequency of Social Gatherings with Friends and Family: Once a week    Attends Religious Services: More than 4 times per year    Active Member of [employer] or Organizations: No    Attends Engineer, Structural: Not on file    Marital Status: Married  Catering Manager Violence: Not At Risk (10/27/2023)   Humiliation, Afraid, Rape, and Kick questionnaire    Fear of Current or Ex-Partner: No    Emotionally Abused: No    Physically Abused: No    Sexually Abused: No   Family History  Problem Relation Age of Onset   Colon cancer Mother        mets from uterine   Uterine cancer Mother    Cancer Mother    Heart disease Father        and sister   Diabetes Father    Hearing loss Father    Stroke Father    Hypertension Father    Heart attack Father    Heart disease Sister    Lung cancer Sister    Stroke Brother    Hypertension Brother    Heart attack Brother    Migraines Neg Hx      Review of Systems  All other systems reviewed and are negative.      Objective:   Physical Exam Vitals reviewed.  Constitutional:      General: He is not in acute distress.    Appearance: He is well-developed. He is not diaphoretic.  HENT:     Head: Normocephalic and atraumatic.     Right Ear: Ear canal and external ear normal. Tympanic membrane is retracted.     Left Ear: Ear canal and external ear normal. Tympanic membrane is retracted.     Nose: Nose normal.     Mouth/Throat:     Pharynx: No oropharyngeal exudate.  Eyes:     General: No scleral icterus.       Right eye: No discharge.        Left eye: No discharge.      Conjunctiva/sclera: Conjunctivae normal.     Pupils: Pupils are equal, round, and reactive to light.  Neck:     Thyroid : No thyromegaly.     Vascular: No JVD.     Trachea: No tracheal deviation.  Cardiovascular:     Rate and Rhythm: Normal rate and regular rhythm.     Heart sounds: Murmur heard.     No friction rub. No gallop.  Pulmonary:     Effort: Pulmonary effort is normal. No respiratory distress.     Breath sounds: Normal breath sounds. No stridor. No wheezing or rales.  Chest:     Chest wall: No tenderness.  Abdominal:     General: Bowel sounds are normal.  There is no distension.     Palpations: Abdomen is soft. There is no mass.     Tenderness: There is no abdominal tenderness. There is no guarding or rebound.     Hernia: No hernia is present.  Musculoskeletal:        General: No tenderness. Normal range of motion.     Cervical back: Normal range of motion and neck supple.  Lymphadenopathy:     Cervical: No cervical adenopathy.  Skin:    General: Skin is warm.     Capillary Refill: Capillary refill takes less than 2 seconds.     Coloration: Skin is not pale.     Findings: No erythema or rash.  Neurological:     Mental Status: He is alert and oriented to person, place, and time.     Cranial Nerves: No cranial nerve deficit.     Sensory: No sensory deficit.     Motor: No abnormal muscle tone.     Coordination: Coordination normal.     Deep Tendon Reflexes: Reflexes normal.  Psychiatric:        Behavior: Behavior normal.        Thought Content: Thought content normal.        Judgment: Judgment normal.           Assessment & Plan:  Essential hypertension Resume amlodipine  5 mg a day and recheck blood pressure in 2 weeks

## 2024-04-10 ENCOUNTER — Encounter: Payer: Self-pay | Admitting: Family Medicine

## 2024-04-16 ENCOUNTER — Institutional Professional Consult (permissible substitution): Admitting: Neurology

## 2024-04-17 ENCOUNTER — Ambulatory Visit: Admitting: Family Medicine

## 2024-04-17 ENCOUNTER — Other Ambulatory Visit (HOSPITAL_COMMUNITY): Payer: Self-pay

## 2024-04-17 ENCOUNTER — Encounter: Payer: Self-pay | Admitting: Family Medicine

## 2024-04-17 VITALS — BP 144/92 | HR 82 | Temp 97.7°F | Ht 68.0 in | Wt 170.4 lb

## 2024-04-17 DIAGNOSIS — Z77128 Contact with and (suspected) exposure to other hazards in the physical environment: Secondary | ICD-10-CM | POA: Insufficient documentation

## 2024-04-17 DIAGNOSIS — I499 Cardiac arrhythmia, unspecified: Secondary | ICD-10-CM | POA: Insufficient documentation

## 2024-04-17 DIAGNOSIS — J329 Chronic sinusitis, unspecified: Secondary | ICD-10-CM

## 2024-04-17 DIAGNOSIS — Z950 Presence of cardiac pacemaker: Secondary | ICD-10-CM | POA: Insufficient documentation

## 2024-04-17 MED ORDER — PREDNISONE 20 MG PO TABS
ORAL_TABLET | ORAL | 0 refills | Status: AC
  Filled 2024-04-17: qty 12, 6d supply, fill #0

## 2024-04-17 MED ORDER — AMOXICILLIN-POT CLAVULANATE 875-125 MG PO TABS
1.0000 | ORAL_TABLET | Freq: Two times a day (BID) | ORAL | 0 refills | Status: AC
  Filled 2024-04-17: qty 20, 10d supply, fill #0

## 2024-04-17 NOTE — Progress Notes (Signed)
 Subjective:    Patient ID: Lee [name], male    DOB: 1951/02/09, 73 y.o.   MRN: [id number]  Patient complains of pain in his frontal sinuses for the last 3 weeks off and on.  The pain is located in both frontal sinuses right greater than left.  He reports a constant headache in this area.  He reports green and yellow nasal discharge.  He does have a history of migraines however his migraines are usually located in his occiput.  This hurts for him to lean forward.  He denies any otalgia or sore throat Past Medical History:  Diagnosis Date   Allergic rhinitis    chronic sinusitis   Allergy    Arrhythmia    Arthritis    osetoarthritis   CAD (coronary artery disease)    a. 2009 PCI/DES to mLAD; b. 05/2010 s/p DES RCA and LAD.; c. 05/2013 Cath: LAD mild ISR, LCX nl, RCA 40p, patent stents.   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   Cataract    has had lasik surg   COPD (chronic obstructive pulmonary disease) (HCC)    Depression    Diabetes mellitus type 2, controlled, without complications (HCC)    Diabetes mellitus without complication (HCC)    Phreesia 08/22/2020   Diverticulosis    DJD (degenerative joint disease)    Esophagitis 1991   grade 1   GERD (gastroesophageal reflux disease)    Hiatal hernia   Hearing loss    wears bilateral hearing aids   Heart murmur    Dr Duanne dx per patient   Hemorrhoids    Hyperlipidemia    Hypertension    Hypertensive heart disease    Iron deficiency anemia    Migraines    Myocardial infarction [facility])    Paroxysmal atrial fibrillation (HCC)    a. s/p afib ablation 10/22/08 (J. Allred).   Presence of permanent cardiac pacemaker    PVC's (premature ventricular contractions)    Sleep apnea    Uses nightly.  Questionalble: RDI during the total sleep time 6h 28 mins was 3.55/hr during REM sleep was at 5.71/hr. Supine AHI was 5.93/hr.   Special screening for malignant neoplasms, colon 10/01/2022   Syncope 08/2018    Past Surgical History:  Procedure Laterality Date   ABDOMINAL HYSTERECTOMY     CARDIAC CATHETERIZATION  05/31/2010   The proximal LAD was then predilated with 2.0 x 12 trek. this was then stented with a 2.5 x 16 promus Element drug-eluting stent at 14 atmosphere and prostdilated with 2.75 x 12 [place] trek at 16 atmospheres(2.8 mm) resulting the reduction of 80% proximal LAD stenosis to 0% residual with excellent flow.   CARDIAC CATHETERIZATION  06/05/2010   Successful percutaneous coronary intervention to the right coronary artery with percutaneous transluminal coronary angioplasty/stenting and insertion 3.0 x 16 mm Promus DES post dilated to 3.35 mm with stenosis being reduced to 0%   CARDIAC CATHETERIZATION  02/29/2008   Post dilatation was performed using a 2.75 x 9 [place] sprinter, 10 atmospheres for 40 seconds and then 9 atmosphere for 35 sec. This resulted in the 80% area of narrowing pre-intervention, now appearing to normal. There was no edvidence of the dissection or thrombus and there was TIMI III flow pre and post.   CARDIAC CATHETERIZATION  02/28/2006   COLONOSCOPY WITH PROPOFOL  N/A 10/01/2022   Procedure: COLONOSCOPY WITH PROPOFOL ;  Surgeon: Eartha Angelia Sieving, MD;  Location: [facility];  Service: Gastroenterology;  Laterality: N/A;  8:00am;asa 3   CORONARY ANGIOPLASTY WITH STENT PLACEMENT     3 stents/ 4 caths   DISTAL BICEPS TENDON REPAIR Left 02/12/2022   Procedure: LEFT DISTAL BICEPS TENDON REPAIR;  Surgeon: Jerri Kay HERO, MD;  Location: [facility] OR;  Service: Orthopedics;  Laterality: Left;   DISTAL BICEPS TENDON REPAIR Left 03/03/2022   Procedure: LEFT DISTAL BICEPS TENDON REPAIR;  Surgeon: Jerri Kay HERO, MD;  Location: [facility] OR;  Service: Orthopedics;  Laterality: Left;   EYE SURGERY     FINGER ARTHROPLASTY Right 09/02/2021   Procedure: RIGHT THUMB CARPOMETACARPAL ARTHROPLASTY;  Surgeon: Jerri Kay HERO, MD;  Location: [facility];  Service: Orthopedics;  Laterality:  Right;  block in preop   INSERT / REPLACE / REMOVE PACEMAKER  05/01/2019   KNEE ARTHROSCOPY     right   LASIK     LEFT HEART CATHETERIZATION WITH CORONARY ANGIOGRAM N/A 06/29/2013   Procedure: LEFT HEART CATHETERIZATION WITH CORONARY ANGIOGRAM;  Surgeon: Alm LELON Clay, MD;  Location: [facility] CATH LAB;  Service: Cardiovascular;  Laterality: N/A;   NASAL SINUS SURGERY  06/01/1999   NECK SURGERY     Cervical fusion   PACEMAKER IMPLANT N/A 05/01/2019   Procedure: PACEMAKER IMPLANT;  Surgeon: Kelsie Agent, MD;  Location: [facility] INVASIVE CV LAB;  Service: Cardiovascular;  Laterality: N/A;   RADIOFREQUENCY ABLATION  09/28/2008   atrial fibrillation   ROTATOR CUFF REPAIR     left   SHOULDER OPEN ROTATOR CUFF REPAIR     right   SPINE SURGERY     WRIST ARTHROCENTESIS     left   Current Outpatient Medications on File Prior to Visit  Medication Sig Dispense Refill   Accu-Chek FastClix Lancets MISC Check blood sugar 2 (two) times daily as directed 102 each 5   acetaminophen  (TYLENOL ) 325 MG tablet Take 1-2 tablets (325-650 mg total) by mouth every 4 (four) hours as needed for mild pain.     amLODipine  (NORVASC ) 5 MG tablet Take 5 mg by mouth daily.     amLODipine  (NORVASC ) 5 MG tablet Take 1 tablet (5 mg total) by mouth daily. 90 tablet 3   aspirin  EC 81 MG tablet Take 81 mg by mouth at bedtime.     Atogepant  (QULIPTA ) 60 MG TABS Take 1 tablet (60 mg total) by mouth daily. 90 tablet 3   Blood Glucose Monitoring Suppl (ACCU-CHEK AVIVA PLUS) w/Device KIT Check BS bid E11.9 1 kit 1   cholecalciferol  (VITAMIN D ) 1000 UNITS tablet Take 1,000 Units by mouth at bedtime.      citalopram  (CELEXA ) 40 MG tablet Take 1 tablet (40 mg total) by mouth daily. 90 tablet 1   Coenzyme Q10 (CO Q 10) 100 MG CAPS Take 100 mg by mouth daily with breakfast.      empagliflozin  (JARDIANCE ) 25 MG TABS tablet Take 1 tablet (25 mg total) by mouth daily before breakfast. 90 tablet 3   fexofenadine (ALLEGRA) 180 MG tablet Take 180  mg by mouth daily with breakfast.      fluticasone  (FLONASE ) 50 MCG/ACT nasal spray INSTILL 2 SPRAYS IN THE  NOSE AT BEDTIME 48 g 3   fluticasone  (FLONASE ) 50 MCG/ACT nasal spray Place 2 sprays into both nostrils daily. 16 g 6   furosemide  (LASIX ) 40 MG tablet Take 1 tablet (40 mg total) by mouth daily. 90 tablet 3   glucose blood test strip Check blood sugar 2 (two) times daily. 100 each 12  Lancets Misc. (ACCU-CHEK FASTCLIX LANCET) KIT USE AS DIRECTED TO CHECK BLOOD SUGAR TWICE DAILY 1 kit 1   Multiple Vitamin (MULTIVITAMIN WITH MINERALS) TABS tablet Take 1 tablet by mouth daily with breakfast.     nitroGLYCERIN  (NITROSTAT ) 0.4 MG SL tablet PLACE ONE TABLET UNDER THE TONGUE EVERY 5 MINUTES AS NEEDED FOR CHEST PAIN 25 tablet 3   oxyCODONE -acetaminophen  (PERCOCET) 10-325 MG tablet Take 1 tablet by mouth every 8 (eight) hours as needed for pain. 21 tablet 0   pantoprazole  (PROTONIX ) 40 MG tablet Take 1 tablet (40 mg total) by mouth 2 (two) times daily. 180 tablet 1   Polyvinyl Alcohol-Povidone (REFRESH OP) Place 1 drop into both eyes at bedtime as needed (dry eyes).     potassium chloride  (MICRO-K ) 10 MEQ CR capsule Take 1 capsule (10 mEq total) by mouth daily. 90 capsule 3   ranolazine  (RANEXA ) 500 MG 12 hr tablet Take 1 tablet (500 mg total) by mouth 2 (two) times daily. 180 tablet 3   Rimegepant Sulfate  75 MG TBDP Take 1 tablet (75 mg total) by mouth daily as needed for migraine. 16 tablet 11   rosuvastatin  (CRESTOR ) 40 MG tablet Take 1 tablet (40 mg total) by mouth daily. 90 tablet 2   telmisartan  (MICARDIS ) 40 MG tablet Take 1 tablet (40 mg total) by mouth daily. 90 tablet 2   tirzepatide  (MOUNJARO ) 5 MG/0.5ML Pen Inject 5 mg into the skin once a week. 6 mL 3   No current facility-administered medications on file prior to visit.   Allergies  Allergen Reactions   Ibuprofen Swelling    Whole body swelled   Isosorbide  Other (See Comments)    migraines   Other Shortness Of Breath    BETA  BLOCKERS   Topamax  [Topiramate ] Other (See Comments)    Pt states it made his tongue feel scalded and he couldn't eat    Toprol Xl [Metoprolol Succinate] Other (See Comments)    Personality change   Metoprolol-Hctz Er Other (See Comments)    Indigestion,mouth raw   Social History   Socioeconomic History   Marital status: Married    Spouse name: Nena   Number of children: 2   Years of education: Not on file   Highest education level: 12th grade  Occupational History   Occupation: retired  Tobacco Use   Smoking status: Former    Current packs/day: 0.00    Average packs/day: 2.0 packs/day for 25.0 years (50.0 ttl pk-yrs)    Types: Cigarettes, Cigars    Start date: 04/23/1963    Quit date: 04/22/1988    Years since quitting: 36.0    Passive exposure: Past   Smokeless tobacco: Never  Vaping Use   Vaping status: Never Used  Substance and Sexual Activity   Alcohol use: Not Currently   Drug use: No   Sexual activity: Not Currently    Birth control/protection: Surgical    Comment: vas  Other Topics Concern   Not on file  Social History Narrative   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   Currently works part time for [employer].   2 daughters.    Married x 43 years 09/2021.   Caffiene:  1 cup daily, 2 diet drinks daily. (Caffiene free most time).    Social Drivers of Corporate Investment Banker Strain: Low Risk  (04/09/2024)   Overall Financial Resource Strain (CARDIA)    Difficulty of Paying Living Expenses: Not hard at all  Food  Insecurity: No Food Insecurity (04/09/2024)   Hunger Vital Sign    Worried About Running Out of Food in the Last Year: Never true    Ran Out of Food in the Last Year: Never true  Transportation Needs: No Transportation Needs (04/09/2024)   PRAPARE - Administrator, Civil Service (Medical): No    Lack of Transportation (Non-Medical): No  Physical Activity: Insufficiently Active (04/09/2024)   Exercise  Vital Sign    Days of Exercise per Week: 2 days    Minutes of Exercise per Session: 30 min  Stress: No Stress Concern Present (04/09/2024)   [employer] of Occupational Health - Occupational Stress Questionnaire    Feeling of Stress: Only a little  Social Connections: Moderately Integrated (04/09/2024)   Social Connection and Isolation Panel    Frequency of Communication with Friends and Family: More than three times a week    Frequency of Social Gatherings with Friends and Family: Once a week    Attends Religious Services: More than 4 times per year    Active Member of [employer] or Organizations: No    Attends Engineer, Structural: Not on file    Marital Status: Married  Catering Manager Violence: Not At Risk (10/27/2023)   Humiliation, Afraid, Rape, and Kick questionnaire    Fear of Current or Ex-Partner: No    Emotionally Abused: No    Physically Abused: No    Sexually Abused: No   Family History  Problem Relation Age of Onset   Colon cancer Mother        mets from uterine   Uterine cancer Mother    Cancer Mother    Heart disease Father        and sister   Diabetes Father    Hearing loss Father    Stroke Father    Hypertension Father    Heart attack Father    Heart disease Sister    Lung cancer Sister    Stroke Brother    Hypertension Brother    Heart attack Brother    Migraines Neg Hx      Review of Systems  All other systems reviewed and are negative.      Objective:   Physical Exam Vitals reviewed.  Constitutional:      General: He is not in acute distress.    Appearance: He is well-developed. He is not diaphoretic.  HENT:     Head: Normocephalic and atraumatic.     Right Ear: Ear canal and external ear normal. Tympanic membrane is retracted.     Left Ear: Ear canal and external ear normal. Tympanic membrane is retracted.     Nose:     Right Sinus: Frontal sinus tenderness present.     Left Sinus: Frontal sinus tenderness present.      Mouth/Throat:     Pharynx: No oropharyngeal exudate.  Eyes:     General: No scleral icterus.       Right eye: No discharge.        Left eye: No discharge.     Conjunctiva/sclera: Conjunctivae normal.     Pupils: Pupils are equal, round, and reactive to light.  Neck:     Thyroid : No thyromegaly.     Vascular: No JVD.     Trachea: No tracheal deviation.  Cardiovascular:     Rate and Rhythm: Normal rate and regular rhythm.     Heart sounds: Murmur heard.     No friction rub. No gallop.  Pulmonary:     Effort: Pulmonary effort is normal. No respiratory distress.     Breath sounds: Normal breath sounds. No stridor. No wheezing or rales.  Chest:     Chest wall: No tenderness.  Abdominal:     General: Bowel sounds are normal. There is no distension.     Palpations: Abdomen is soft. There is no mass.     Tenderness: There is no abdominal tenderness. There is no guarding or rebound.     Hernia: No hernia is present.  Musculoskeletal:        General: No tenderness. Normal range of motion.     Cervical back: Normal range of motion and neck supple.  Lymphadenopathy:     Cervical: No cervical adenopathy.  Skin:    General: Skin is warm.     Capillary Refill: Capillary refill takes less than 2 seconds.     Coloration: Skin is not pale.     Findings: No erythema or rash.  Neurological:     Mental Status: He is alert and oriented to person, place, and time.     Cranial Nerves: No cranial nerve deficit.     Sensory: No sensory deficit.     Motor: No abnormal muscle tone.     Coordination: Coordination normal.     Deep Tendon Reflexes: Reflexes normal.  Psychiatric:        Behavior: Behavior normal.        Thought Content: Thought content normal.        Judgment: Judgment normal.           Assessment & Plan:  Rhinosinusitis Begin Augmentin  875 mg twice daily for 10 days.  Add prednisone  taper pack for sinusitis.  Recheck in 2 weeks if no better or sooner if worse

## 2024-04-24 ENCOUNTER — Ambulatory Visit (INDEPENDENT_AMBULATORY_CARE_PROVIDER_SITE_OTHER): Admitting: Audiology

## 2024-04-24 ENCOUNTER — Encounter (INDEPENDENT_AMBULATORY_CARE_PROVIDER_SITE_OTHER): Payer: Self-pay | Admitting: Otolaryngology

## 2024-04-24 ENCOUNTER — Ambulatory Visit (INDEPENDENT_AMBULATORY_CARE_PROVIDER_SITE_OTHER): Admitting: Otolaryngology

## 2024-04-24 VITALS — BP 125/83 | HR 85 | Ht 68.0 in | Wt 165.0 lb

## 2024-04-24 DIAGNOSIS — G4733 Obstructive sleep apnea (adult) (pediatric): Secondary | ICD-10-CM

## 2024-04-24 DIAGNOSIS — H6991 Unspecified Eustachian tube disorder, right ear: Secondary | ICD-10-CM | POA: Diagnosis not present

## 2024-04-24 DIAGNOSIS — H903 Sensorineural hearing loss, bilateral: Secondary | ICD-10-CM

## 2024-04-24 DIAGNOSIS — H938X1 Other specified disorders of right ear: Secondary | ICD-10-CM

## 2024-04-24 DIAGNOSIS — Z789 Other specified health status: Secondary | ICD-10-CM

## 2024-04-24 NOTE — Progress Notes (Signed)
 Dear Dr. Duanne, Here is my assessment for our mutual patient, Lee [name]. Thank you for allowing me the opportunity to care for your patient. Please do not hesitate to contact me should you have any other questions. Sincerely, Dr. Eldora Blanch  Otolaryngology Clinic Note Referring provider: Dr. Duanne HPI:  Lee [name] is a 73 y.o. male kindly referred by Dr. Duanne for evaluation of ear fullness  Initial visit (12/2023): Patient reports: reports R>L ear fullness, ongoing for several months (maybe about a year). Comes and goes - only happens when he is outside working but now comes on more regularly. Takes about 45 mins - 1 hour and then it gets back to normal. No associated symptoms in ear including tinnitus, frank vertigo, worsening hearing loss. He does report that he has major allergy issues and has had septo/turbs with Dr. Mable --- does have typical AR symptoms. He tried flonase  but did not help. No antecedent event including URI Patient denies: ear pain, fullness, vertigo, drainage, tinnitus Patient also denies barotrauma, vestibular suppressant use, ototoxic medication use Prior ear surgery: no  Does use HA  Of note, he also uses CPAP and has OSA and is interested in [place] device. However, last sleep test was several years ago and he has lost over 50lbs since.  --------------------------------------------------------- 04/24/2024 Continues to have fullness R>L ONLY with exertion; takes about 15-20 minutes and then goes away; no other symptoms; does have typical AR symptoms; no other ear symptoms currently. Steroids did not help. Still using nasal sprays. Has HA. We did discuss his audiogram. He is quite frustrated with his ear symptoms.  Has not gotten HST yet but scheduled for f/u with Sleep in Dec   Personal or FHx of bleeding dz or anesthesia difficulty: no  AP/AC: ASA 81  Tobacco: former, quit  ENT Surgery: septo/turbs  PMHx: CAD s/p stents, AV block,  Aortic stenosis, HTN, HLD, OSA, T2DM, Migraines  Independent Review of Additional Tests or Records:  Dr. Duanne (11/07/2023): noted both ears feel stopped up, left > right, for months; no tinnitus or hearing loss or vertigo; Dx: ETD; Rx: flonase  BID, ref to ENT if not improving CBC and CMP 06/22/2023: WBC 7.1, Eos 178; BUN/Cr 11/0.72 CTH 05/03/2020 independently interpreted with respect to sinuses/nasal cavity and ears: cannot visualize max entirely but visualized paranasal sinuses clear; cuts thick but mastoids and ME well aerated and unable to appreciate any otic capsule or ossicular chain abnormalities 03/2024 Audiogram was independently reviewed and interpreted by me and it reveals - A/A tymps; normal, downslopin to mod/sev SNHL AU with WRT 88/80% AD/AS  SNHL= Sensorineural hearing los   PMH/Meds/All/SocHx/FamHx/ROS:   Past Medical History:  Diagnosis Date   Allergic rhinitis    chronic sinusitis   Allergy    Arrhythmia    Arthritis    osetoarthritis   CAD (coronary artery disease)    a. 2009 PCI/DES to mLAD; b. 05/2010 s/p DES RCA and LAD.; c. 05/2013 Cath: LAD mild ISR, LCX nl, RCA 40p, patent stents.   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   Cataract    has had lasik surg   COPD (chronic obstructive pulmonary disease) (HCC)    Depression    Diabetes mellitus type 2, controlled, without complications (HCC)    Diabetes mellitus without complication (HCC)    Phreesia 08/22/2020   Diverticulosis    DJD (degenerative joint disease)    Esophagitis 1991   grade 1   GERD (gastroesophageal  reflux disease)    Hiatal hernia   Hearing loss    wears bilateral hearing aids   Heart murmur    Dr Duanne dx per patient   Hemorrhoids    Hyperlipidemia    Hypertension    Hypertensive heart disease    Iron deficiency anemia    Migraines    Myocardial infarction [facility])    Paroxysmal atrial fibrillation (HCC)    a. s/p afib ablation 10/22/08 (J. Allred).   Presence  of permanent cardiac pacemaker    PVC's (premature ventricular contractions)    Sleep apnea    Uses nightly.  Questionalble: RDI during the total sleep time 6h 28 mins was 3.55/hr during REM sleep was at 5.71/hr. Supine AHI was 5.93/hr.   Special screening for malignant neoplasms, colon 10/01/2022   Syncope 08/2018     Past Surgical History:  Procedure Laterality Date   ABDOMINAL HYSTERECTOMY     CARDIAC CATHETERIZATION  05/31/2010   The proximal LAD was then predilated with 2.0 x 12 trek. this was then stented with a 2.5 x 16 promus Element drug-eluting stent at 14 atmosphere and prostdilated with 2.75 x 12 Omega trek at 16 atmospheres(2.8 mm) resulting the reduction of 80% proximal LAD stenosis to 0% residual with excellent flow.   CARDIAC CATHETERIZATION  06/05/2010   Successful percutaneous coronary intervention to the right coronary artery with percutaneous transluminal coronary angioplasty/stenting and insertion 3.0 x 16 mm Promus DES post dilated to 3.35 mm with stenosis being reduced to 0%   CARDIAC CATHETERIZATION  02/29/2008   Post dilatation was performed using a 2.75 x 9 Girard sprinter, 10 atmospheres for 40 seconds and then 9 atmosphere for 35 sec. This resulted in the 80% area of narrowing pre-intervention, now appearing to normal. There was no edvidence of the dissection or thrombus and there was TIMI III flow pre and post.   CARDIAC CATHETERIZATION  02/28/2006   COLONOSCOPY WITH PROPOFOL  N/A 10/01/2022   Procedure: COLONOSCOPY WITH PROPOFOL ;  Surgeon: Eartha Angelia Sieving, MD;  [facility]: [facility];  Service: Gastroenterology;  Laterality: N/A;  8:00am;asa 3   CORONARY ANGIOPLASTY WITH STENT PLACEMENT     3 stents/ 4 caths   DISTAL BICEPS TENDON REPAIR Left 02/12/2022   Procedure: LEFT DISTAL BICEPS TENDON REPAIR;  Surgeon: Jerri Kay HERO, MD;  Location: [facility] OR;  Service: Orthopedics;  Laterality: Left;   DISTAL BICEPS TENDON REPAIR Left 03/03/2022   Procedure: LEFT DISTAL  BICEPS TENDON REPAIR;  Surgeon: Jerri Kay HERO, MD;  Location: [facility] OR;  Service: Orthopedics;  Laterality: Left;   EYE SURGERY     FINGER ARTHROPLASTY Right 09/02/2021   Procedure: RIGHT THUMB CARPOMETACARPAL ARTHROPLASTY;  Surgeon: Jerri Kay HERO, MD;  Location: [facility];  Service: Orthopedics;  Laterality: Right;  block in preop   INSERT / REPLACE / REMOVE PACEMAKER  05/01/2019   KNEE ARTHROSCOPY     right   LASIK     LEFT HEART CATHETERIZATION WITH CORONARY ANGIOGRAM N/A 06/29/2013   Procedure: LEFT HEART CATHETERIZATION WITH CORONARY ANGIOGRAM;  Surgeon: Alm LELON Clay, MD;  Location: [facility] CATH LAB;  Service: Cardiovascular;  Laterality: N/A;   NASAL SINUS SURGERY  06/01/1999   NECK SURGERY     Cervical fusion   PACEMAKER IMPLANT N/A 05/01/2019   Procedure: PACEMAKER IMPLANT;  Surgeon: Kelsie Agent, MD;  Location: [facility] INVASIVE CV LAB;  Service: Cardiovascular;  Laterality: N/A;   RADIOFREQUENCY ABLATION  09/28/2008   atrial fibrillation   ROTATOR  CUFF REPAIR     left   SHOULDER OPEN ROTATOR CUFF REPAIR     right   SPINE SURGERY     WRIST ARTHROCENTESIS     left    Family History  Problem Relation Age of Onset   Colon cancer Mother        mets from uterine   Uterine cancer Mother    Cancer Mother    Heart disease Father        and sister   Diabetes Father    Hearing loss Father    Stroke Father    Hypertension Father    Heart attack Father    Heart disease Sister    Lung cancer Sister    Stroke Brother    Hypertension Brother    Heart attack Brother    Migraines Neg Hx      Social Connections: Moderately Integrated (04/09/2024)   Social Connection and Isolation Panel    Frequency of Communication with Friends and Family: More than three times a week    Frequency of Social Gatherings with Friends and Family: Once a week    Attends Religious Services: More than 4 times per year    Active Member of [employer] or Organizations: No    Attends Museum/gallery Exhibitions Officer: Not on file    Marital Status: Married      Current Outpatient Medications:    Accu-Chek FastClix Lancets MISC, Check blood sugar 2 (two) times daily as directed, Disp: 102 each, Rfl: 5   acetaminophen  (TYLENOL ) 325 MG tablet, Take 1-2 tablets (325-650 mg total) by mouth every 4 (four) hours as needed for mild pain., Disp:  , Rfl:    amLODipine  (NORVASC ) 5 MG tablet, Take 5 mg by mouth daily., Disp: , Rfl:    amLODipine  (NORVASC ) 5 MG tablet, Take 1 tablet (5 mg total) by mouth daily., Disp: 90 tablet, Rfl: 3   amoxicillin -clavulanate (AUGMENTIN ) 875-125 MG tablet, Take 1 tablet by mouth 2 (two) times daily., Disp: 20 tablet, Rfl: 0   aspirin  EC 81 MG tablet, Take 81 mg by mouth at bedtime., Disp: , Rfl:    Atogepant  (QULIPTA ) 60 MG TABS, Take 1 tablet (60 mg total) by mouth daily., Disp: 90 tablet, Rfl: 3   Blood Glucose Monitoring Suppl (ACCU-CHEK AVIVA PLUS) w/Device KIT, Check BS bid E11.9, Disp: 1 kit, Rfl: 1   cholecalciferol  (VITAMIN D ) 1000 UNITS tablet, Take 1,000 Units by mouth at bedtime. , Disp: , Rfl:    citalopram  (CELEXA ) 40 MG tablet, Take 1 tablet (40 mg total) by mouth daily., Disp: 90 tablet, Rfl: 1   Coenzyme Q10 (CO Q 10) 100 MG CAPS, Take 100 mg by mouth daily with breakfast. , Disp: , Rfl:    empagliflozin  (JARDIANCE ) 25 MG TABS tablet, Take 1 tablet (25 mg total) by mouth daily before breakfast., Disp: 90 tablet, Rfl: 3   fexofenadine (ALLEGRA) 180 MG tablet, Take 180 mg by mouth daily with breakfast. , Disp: , Rfl:    fluticasone  (FLONASE ) 50 MCG/ACT nasal spray, INSTILL 2 SPRAYS IN THE  NOSE AT BEDTIME, Disp: 48 g, Rfl: 3   fluticasone  (FLONASE ) 50 MCG/ACT nasal spray, Place 2 sprays into both nostrils daily., Disp: 16 g, Rfl: 6   furosemide  (LASIX ) 40 MG tablet, Take 1 tablet (40 mg total) by mouth daily., Disp: 90 tablet, Rfl: 3   glucose blood test strip, Check blood sugar 2 (two) times daily., Disp: 100 each, Rfl: 12   Lancets  Misc.  (ACCU-CHEK FASTCLIX LANCET) KIT, USE AS DIRECTED TO CHECK BLOOD SUGAR TWICE DAILY, Disp: 1 kit, Rfl: 1   Multiple Vitamin (MULTIVITAMIN WITH MINERALS) TABS tablet, Take 1 tablet by mouth daily with breakfast., Disp: , Rfl:    nitroGLYCERIN  (NITROSTAT ) 0.4 MG SL tablet, PLACE ONE TABLET UNDER THE TONGUE EVERY 5 MINUTES AS NEEDED FOR CHEST PAIN, Disp: 25 tablet, Rfl: 3   oxyCODONE -acetaminophen  (PERCOCET) 10-325 MG tablet, Take 1 tablet by mouth every 8 (eight) hours as needed for pain., Disp: 21 tablet, Rfl: 0   pantoprazole  (PROTONIX ) 40 MG tablet, Take 1 tablet (40 mg total) by mouth 2 (two) times daily., Disp: 180 tablet, Rfl: 1   Polyvinyl Alcohol-Povidone (REFRESH OP), Place 1 drop into both eyes at bedtime as needed (dry eyes)., Disp: , Rfl:    potassium chloride  (MICRO-K ) 10 MEQ CR capsule, Take 1 capsule (10 mEq total) by mouth daily., Disp: 90 capsule, Rfl: 3   predniSONE  (DELTASONE ) 20 MG tablet, Take 3 tablets by mouth daily for 2 days, THEN 2 tablets daily for 2 days, THEN 1 tablet daily for 2 days., Disp: 12 tablet, Rfl: 0   ranolazine  (RANEXA ) 500 MG 12 hr tablet, Take 1 tablet (500 mg total) by mouth 2 (two) times daily., Disp: 180 tablet, Rfl: 3   Rimegepant Sulfate  75 MG TBDP, Take 1 tablet (75 mg total) by mouth daily as needed for migraine., Disp: 16 tablet, Rfl: 11   rosuvastatin  (CRESTOR ) 40 MG tablet, Take 1 tablet (40 mg total) by mouth daily., Disp: 90 tablet, Rfl: 2   telmisartan  (MICARDIS ) 40 MG tablet, Take 1 tablet (40 mg total) by mouth daily., Disp: 90 tablet, Rfl: 2   tirzepatide  (MOUNJARO ) 5 MG/0.5ML Pen, Inject 5 mg into the skin once a week., Disp: 6 mL, Rfl: 3   Physical Exam:   BP 125/83 (BP Location: Right Arm, Patient Position: Sitting, Cuff Size: Large)   Pulse 85   Ht 5' 8 (1.727 m)   Wt 165 lb (74.8 kg)   SpO2 95%   BMI 25.09 kg/m   Salient findings:  CN II-XII intact Given history and complaints, ear microscopy was indicated and performed for  evaluation with findings as below in physical exam section and in procedures; Bilateral EAC clear and TM intact with well pneumatized middle ear space still; given persistent issues, we discussed diagnostic myringotomy which patient opted for Weber 512: mid Rinne 512: AC > BC b/l  Anterior rhinoscopy: Septum intact; bilateral inferior turbinates without significant hypertrophy No lesions of oral cavity/oropharynx; tongue friedman 2 No obviously palpable neck masses/lymphadenopathy/thyromegaly No respiratory distress or stridor  Seprately Identifiable Procedures:  Prior to initiating any procedures, risks/benefits/alternatives were explained to the patient and verbal consent obtained. Otolaryngology Procedure Visit:   Procedure: Bilateral ear microscopy with right diagnostic myringotomy (CPT [phone]) - RT Pre-procedure diagnosis:  Right ear fullness Post-procedure diagnosis: same Indication: Patient is a 72 y.o. male with pre-procedure diagnoses above. We discussed options: 1. Observation 2. Nasal sprays 3. Diagnostic myringotomy. Risks discussed, patient opted for diagnostic myringotomy  We had a long discussion about benefits and risks of procedure including pain, bleeding, infection,persistent TM perforation, otorrhea, injury to middle or external ear structures, lack of improvement, cholesteatoma, hearing loss, among others. Consent was obtained prior to proceeding.  Findings: Right Ear: no effusion Left ear: ME clear without effusion Successful right diagnostic myringotomy  Complications: None apparent  Procedure details: Patient was placed semi recumbent on the exam chair. The right ear  was addressed first with binocular microscopy and any cerumen was cleaned from the ear canal. The tympanic membrane was visualized, with findings as above. A focal spot over the anterior-inferior TM was anesthetized with topical phenol. A myringotomy incision was tthen made radially. No effusion was  encountered. The middle ear cleft was not suctioned. A cotton ball was placed at the meatus. The opposite ear was examined with findings above  Patient tolerated the procedure well  Impression & Plans:  Lee [name] is a 73 y.o. male with  1. Intolerance of continuous positive airway pressure (CPAP) ventilation   2. Dysfunction of right eustachian tube   3. Sensation of fullness in right ear   4. OSA (obstructive sleep apnea)   5. Sensorineural hearing loss (SNHL) of both ears    Ear sx: Transient, lasts about 30-60 mins. Has tried flonase  and pred without benefit. We discussed DDX and options - could just be referred sensation; but given very focal sx, we discussed and I offered diagnostic myringotomy which we did. May not help but willing to try; keep diary of sx  SNHL: d/w pt amplification but he opted to observe  OSA: will need updated sleep test; briefly discussed inspire but will get to meet criteria prior to DISE  - f/u 2 months  See below regarding exact medications prescribed this encounter including dosages and route: No orders of the defined types were placed in this encounter.     Thank you for allowing me the opportunity to care for your patient. Please do not hesitate to contact me should you have any other questions.  Sincerely, Eldora Blanch, MD Otolaryngologist (ENT), [facility] Phone: [phone] Fax: [phone]  04/24/2024, 1:02 PM   MDM:  I have personally spent 30 minutes involved in face-to-face and non-face-to-face activities for this patient on the day of the visit.  Professional time spent excludes any procedures performed but includes the following activities, in addition to those noted in the documentation: preparing to see the patient (review of outside documentation and results), performing a medically appropriate examination, counseling, documenting in the electronic health record, independently interpreting results

## 2024-04-24 NOTE — Progress Notes (Signed)
  [address] [place], [place] [address] [phone]  Audiological Evaluation    Name: Lee [name]     DOB:   1950/06/08      MRN:   [id number]                                                                                     Service Date: 04/24/2024     Accompanied by: unaccompanied   Patient comes today after Dr. Tobie, ENT sent a referral for a hearing evaluation due to concerns with Eustachian tube dysfunction.   Symptoms Yes Details  Hearing loss  [x]  Known to be in both ears  Tinnitus  []    Ear pain/ infections/pressure  [x]  Right Eustachian tube dysfunction- not perceived today and says he notices it with the warmer weather  Balance problems  []    Noise exposure history  []    Previous ear surgeries  []    Family history of hearing loss  []    Amplification  [x]  Has a set of receiver in the canal Phonak aids from [facility]  Other  []      Otoscopy: Right ear: Clear external ear canal and notable landmarks visualized on the tympanic membrane. Left ear:  Clear external ear canal and notable landmarks visualized on the tympanic membrane.  Tympanometry: Right ear: Normal external ear canal volume with normal middle ear pressure and tympanic membrane compliance (Type A). Findings are suggestive of normal middle ear function. Left ear: Normal external ear canal volume with normal middle ear pressure and tympanic membrane compliance (Type A). Findings are suggestive of normal middle ear function.  Hearing Evaluation The hearing test results were completed under headphones and results are deemed to be of good reliability. Test technique:  conventional    Pure tone Audiometry: Both ears- Normal sloping to moderately severe sensorineural hearing loss from 250 Hz - 8000 Hz.    Speech Audiometry: Right ear- Speech Reception Threshold (SRT) was obtained at 20 dBHL. Left ear-Speech Reception Threshold (SRT) was obtained at 25 dBHL.   Word Recognition  Score Tested using NU-6 (recorded) Right ear: 88% was obtained at a presentation level of 80 dBHL with contralateral masking which is deemed as  good . Left ear: 80% was obtained at a presentation level of 80 dBHL with contralateral masking which is deemed as  good .   Impression: Today's test results show that hearing has remained for the last year based on audiogram from November 2024 that the patient brought in from [facility].  Recommendations: Follow up with ENT as scheduled for today. Return for a hearing evaluation if concerns with hearing changes arise or per MD recommendation. Recommend a hearing aid check with their audiologist or hearing aid dispenser.   Riddhi Grether MARIE LEROUX-MARTINEZ, AUD

## 2024-04-30 ENCOUNTER — Other Ambulatory Visit (HOSPITAL_COMMUNITY): Payer: Self-pay

## 2024-05-06 ENCOUNTER — Other Ambulatory Visit: Payer: Self-pay | Admitting: Family Medicine

## 2024-05-07 ENCOUNTER — Ambulatory Visit: Admitting: Neurology

## 2024-05-07 ENCOUNTER — Encounter: Payer: Self-pay | Admitting: Neurology

## 2024-05-07 ENCOUNTER — Other Ambulatory Visit: Payer: Self-pay

## 2024-05-07 VITALS — BP 146/88 | HR 86 | Ht 68.0 in | Wt 171.0 lb

## 2024-05-07 DIAGNOSIS — Z789 Other specified health status: Secondary | ICD-10-CM | POA: Diagnosis not present

## 2024-05-07 DIAGNOSIS — G4733 Obstructive sleep apnea (adult) (pediatric): Secondary | ICD-10-CM

## 2024-05-07 DIAGNOSIS — Z9861 Coronary angioplasty status: Secondary | ICD-10-CM

## 2024-05-07 DIAGNOSIS — R634 Abnormal weight loss: Secondary | ICD-10-CM | POA: Diagnosis not present

## 2024-05-07 DIAGNOSIS — Z950 Presence of cardiac pacemaker: Secondary | ICD-10-CM | POA: Diagnosis not present

## 2024-05-07 DIAGNOSIS — R351 Nocturia: Secondary | ICD-10-CM | POA: Diagnosis not present

## 2024-05-07 DIAGNOSIS — R519 Headache, unspecified: Secondary | ICD-10-CM

## 2024-05-07 DIAGNOSIS — I2510 Atherosclerotic heart disease of native coronary artery without angina pectoris: Secondary | ICD-10-CM | POA: Diagnosis not present

## 2024-05-07 DIAGNOSIS — Z8679 Personal history of other diseases of the circulatory system: Secondary | ICD-10-CM

## 2024-05-07 NOTE — Progress Notes (Signed)
 Subjective:    Patient ID: Lee [name] is a 73 y.o. male.  HPI    True Mar, MD, PhD [facility] [address] [address] [place], [place] [address]  Dear Dr. Duanne,   I saw your patient, Lee [name], upon your kind request in my sleep clinic today for evaluation of his sleep apnea.  The patient is accompanied by his wife today.  As you know, Lee [name] is a 73 year old male with an underlying complex medical history of coronary artery disease with status post stenting, allergic rhinitis, arthritis, COPD, depression, diverticulosis, reflux disease, hearing loss, paroxysmal A-fib with status post ablation, PVCs, migraine headaches (previously followed by Dr. Ines in this clinic and now sees Duwaine Russell, NP, prior to that saw [facility] in the past, different providers), overweight state, status post multiple surgeries including arthroscopic surgeries and orthopedic surgeries as well as radiofrequency cardiac ablation and pacemaker placement, who was previously diagnosed with obstructive sleep apnea and placed on PAP therapy.  Sleep testing was several years ago, over 10 years ago.  His Epworth sleepiness score is 12 out of 24, fatigue severity score is 58 out of 63.  He has a [employer] AirSense 10 machine and I was able to review his download.  In the past 90 days he has used his machine 29 days, he recently restarted trying it about a month ago.  He has had trouble tolerating treatment and was interested in inspire, saw ENT for this.  He now has a different fullface mask, a [employer]Med AirFit F40 which he can tolerate a little better but overall usage is not very long on an average night, about 2-1/2 hours.  He originally started on CPAP at 13 he recalls and reduced his pressure on his own to 9.6 cm.  Leak is acceptable currently.  Used to be followed by cardiology, particularly Dr. Burnard but has not really seen anybody for sleep recently.  He had a  sleep study back in 2007 which did not actually show any significant obstructive sleep apnea but he recalls being told that he had moderately severe sleep apnea, may have had testing in 2014 with a baseline sleep study but those results are not scanned into his chart.  He lives with his wife.  His daughter has a history of sleep apnea but no longer uses her machine and he therefore uses her machine as a backup and he would like to get a new machine, specifically the AirSense 11 from [employer] and would even invest in a travel machine if he could tolerate treatment better.  He tried an over-the-counter oral appliance but it was uncomfortable.  Bedtime is generally between 10 and 11 and rise time around 7.  He has nocturia about 3 times per average night and has had frequent morning headaches.  He drinks decaf coffee in the mornings and quite a few decaf sodas, diet, about 5/day.  He does not drink any alcohol and quit smoking in 89.  He has had significant weight loss over time, intentional. .'s testing was through the [facility] on 11/22/2012 and I was able to review test results in his electronic chart.  He had a PAP titration study at the time.  His weight was much higher at the time 245 with a BMI of 37.  He has had significant weight loss since then.  He has not had a repeat sleep study in years.  He has had some  trouble tolerating his PAP machine.  He recently saw ENT for ear fullness.  His Past Medical History Is Significant For: Past Medical History:  Diagnosis Date   Allergic rhinitis    chronic sinusitis   Allergy    Arrhythmia    Arthritis    osetoarthritis   CAD (coronary artery disease)    a. 2009 PCI/DES to mLAD; b. 05/2010 s/p DES RCA and LAD.; c. 05/2013 Cath: LAD mild ISR, LCX nl, RCA 40p, patent stents.   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   Cataract    has had lasik surg   COPD (chronic obstructive pulmonary disease) (HCC)    Depression     Diabetes mellitus type 2, controlled, without complications (HCC)    Diabetes mellitus without complication (HCC)    Phreesia 08/22/2020   Diverticulosis    DJD (degenerative joint disease)    Esophagitis 1991   grade 1   GERD (gastroesophageal reflux disease)    Hiatal hernia   Hearing loss    wears bilateral hearing aids   Heart murmur    Dr Duanne dx per patient   Hemorrhoids    Hyperlipidemia    Hypertension    Hypertensive heart disease    Iron deficiency anemia    Migraines    Myocardial infarction [facility])    Paroxysmal atrial fibrillation (HCC)    a. s/p afib ablation 10/22/08 (J. Allred).   Presence of permanent cardiac pacemaker    PVC's (premature ventricular contractions)    Sleep apnea    Uses nightly.  Questionalble: RDI during the total sleep time 6h 28 mins was 3.55/hr during REM sleep was at 5.71/hr. Supine AHI was 5.93/hr.   Special screening for malignant neoplasms, colon 10/01/2022   Syncope 08/2018    His Past Surgical History Is Significant For: Past Surgical History:  Procedure Laterality Date   ABDOMINAL HYSTERECTOMY     CARDIAC CATHETERIZATION  05/31/2010   The proximal LAD was then predilated with 2.0 x 12 trek. this was then stented with a 2.5 x 16 promus Element drug-eluting stent at 14 atmosphere and prostdilated with 2.75 x 12 [place] trek at 16 atmospheres(2.8 mm) resulting the reduction of 80% proximal LAD stenosis to 0% residual with excellent flow.   CARDIAC CATHETERIZATION  06/05/2010   Successful percutaneous coronary intervention to the right coronary artery with percutaneous transluminal coronary angioplasty/stenting and insertion 3.0 x 16 mm Promus DES post dilated to 3.35 mm with stenosis being reduced to 0%   CARDIAC CATHETERIZATION  02/29/2008   Post dilatation was performed using a 2.75 x 9 [place] sprinter, 10 atmospheres for 40 seconds and then 9 atmosphere for 35 sec. This resulted in the 80% area of narrowing pre-intervention, now appearing  to normal. There was no edvidence of the dissection or thrombus and there was TIMI III flow pre and post.   CARDIAC CATHETERIZATION  02/28/2006   COLONOSCOPY WITH PROPOFOL  N/A 10/01/2022   Procedure: COLONOSCOPY WITH PROPOFOL ;  Surgeon: Eartha Angelia Sieving, MD;  [facility];  Service: Gastroenterology;  Laterality: N/A;  8:00am;asa 3   CORONARY ANGIOPLASTY WITH STENT PLACEMENT     3 stents/ 4 caths   DISTAL BICEPS TENDON REPAIR Left 02/12/2022   Procedure: LEFT DISTAL BICEPS TENDON REPAIR;  Surgeon: Jerri Kay HERO, MD;  Location: [facility] OR;  Service: Orthopedics;  Laterality: Left;   DISTAL BICEPS TENDON REPAIR Left 03/03/2022   Procedure: LEFT DISTAL BICEPS TENDON REPAIR;  Surgeon:  Jerri Kay HERO, MD;  [facility];  Service: Orthopedics;  Laterality: Left;   EYE SURGERY     FINGER ARTHROPLASTY Right 09/02/2021   Procedure: RIGHT THUMB CARPOMETACARPAL ARTHROPLASTY;  Surgeon: Jerri Kay HERO, MD;  Location: [facility];  Service: Orthopedics;  Laterality: Right;  block in preop   INSERT / REPLACE / REMOVE PACEMAKER  05/01/2019   KNEE ARTHROSCOPY     right   LASIK     LEFT HEART CATHETERIZATION WITH CORONARY ANGIOGRAM N/A 06/29/2013   Procedure: LEFT HEART CATHETERIZATION WITH CORONARY ANGIOGRAM;  Surgeon: Alm LELON Clay, MD;  Location[facility] C[facility] LAB;  Service: Cardiovascular;  Laterality: N/A;   NASAL SINUS SURGERY  06/01/1999   NECK SURGERY     Cervical fusion   PACEMAKER IMPLANT N/A 05/01/2019   Procedure: PACEMAKER IMPLANT;  Surgeon: Kelsie Agent, MD;  Location: [facility] INVASIVE CV LAB;  Service: Cardiovascular;  Laterality: N/A;   RADIOFREQUENCY ABLATION  09/28/2008   atrial fibrillation   ROTATOR CUFF REPAIR     left   SHOULDER OPEN ROTATOR CUFF REPAIR     right   SPINE SURGERY     WRIST ARTHROCENTESIS     left    His Family History Is Significant For: Family History  Problem Relation Age of Onset   Colon cancer Mother        mets from uterine    Uterine cancer Mother    Cancer Mother    Heart disease Father        and sister   Diabetes Father    Hearing loss Father    Stroke Father    Hypertension Father    Heart attack Father    Heart disease Sister    Lung cancer Sister    Stroke Brother    Hypertension Brother    Heart attack Brother    Sleep apnea Daughter    Migraines Neg Hx     His Social History Is Significant For: Social History   Socioeconomic History   Marital status: Married    Spouse name: Nena   Number of children: 2   Years of education: Not on file   Highest education level: 12th grade  Occupational History   Occupation: retired  Tobacco Use   Smoking status: Former    Current packs/day: 0.00    Average packs/day: 2.0 packs/day for 25.0 years (50.0 ttl pk-yrs)    Types: Cigarettes, Cigars    Start date: 04/23/1963    Quit date: 04/22/1988    Years since quitting: 36.0    Passive exposure: Past   Smokeless tobacco: Never  Vaping Use   Vaping status: Never Used  Substance and Sexual Activity   Alcohol use: Not Currently   Drug use: No   Sexual activity: Not Currently    Birth control/protection: Surgical    Comment: vas  Other Topics Concern   Not on file  Social History Narrative   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   Currently works part time for [employer].   2 daughters.    Married x 43 years 09/2021.   Caffiene:  1 cup daily, 2 diet drinks daily. (Caffiene free most time).    Social Drivers of Corporate Investment Banker Strain: Low Risk  (04/09/2024)   Overall Financial Resource Strain (CARDIA)    Difficulty of Paying Living Expenses: Not hard at all  Food Insecurity: No Food Insecurity (04/09/2024)   Hunger Vital Sign  Worried About Programme Researcher, Broadcasting/film/video in the Last Year: Never true    Ran Out of Food in the Last Year: Never true  Transportation Needs: No Transportation Needs (04/09/2024)   PRAPARE - Administrator, Civil Service  (Medical): No    Lack of Transportation (Non-Medical): No  Physical Activity: Insufficiently Active (04/09/2024)   Exercise Vital Sign    Days of Exercise per Week: 2 days    Minutes of Exercise per Session: 30 min  Stress: No Stress Concern Present (04/09/2024)   [employer] of Occupational Health - Occupational Stress Questionnaire    Feeling of Stress: Only a little  Social Connections: Moderately Integrated (04/09/2024)   Social Connection and Isolation Panel    Frequency of Communication with Friends and Family: More than three times a week    Frequency of Social Gatherings with Friends and Family: Once a week    Attends Religious Services: More than 4 times per year    Active Member of [employer] or Organizations: No    Attends Engineer, Structural: Not on file    Marital Status: Married    His Allergies Are:  Allergies  Allergen Reactions   Ibuprofen Swelling    Whole body swelled   Isosorbide  Other (See Comments)    migraines   Other Shortness Of Breath    BETA BLOCKERS   Topamax  [Topiramate ] Other (See Comments)    Pt states it made his tongue feel scalded and he couldn't eat    Toprol Xl [Metoprolol Succinate] Other (See Comments)    Personality change   Metoprolol-Hctz Er Other (See Comments)    Indigestion,mouth raw  :   His Current Medications Are:  Outpatient Encounter Medications as of 05/07/2024  Medication Sig   Accu-Chek FastClix Lancets MISC Check blood sugar 2 (two) times daily as directed   acetaminophen  (TYLENOL ) 325 MG tablet Take 1-2 tablets (325-650 mg total) by mouth every 4 (four) hours as needed for mild pain.   amLODipine  (NORVASC ) 5 MG tablet Take 5 mg by mouth daily.   amLODipine  (NORVASC ) 5 MG tablet Take 1 tablet (5 mg total) by mouth daily.   amoxicillin -clavulanate (AUGMENTIN ) 875-125 MG tablet Take 1 tablet by mouth 2 (two) times daily.   aspirin  EC 81 MG tablet Take 81 mg by mouth at bedtime.   Atogepant  (QULIPTA ) 60 MG TABS  Take 1 tablet (60 mg total) by mouth daily.   Blood Glucose Monitoring Suppl (ACCU-CHEK AVIVA PLUS) w/Device KIT Check BS bid E11.9   cholecalciferol  (VITAMIN D ) 1000 UNITS tablet Take 1,000 Units by mouth at bedtime.    citalopram  (CELEXA ) 40 MG tablet Take 1 tablet (40 mg total) by mouth daily.   Coenzyme Q10 (CO Q 10) 100 MG CAPS Take 100 mg by mouth daily with breakfast.    empagliflozin  (JARDIANCE ) 25 MG TABS tablet Take 1 tablet (25 mg total) by mouth daily before breakfast.   fexofenadine (ALLEGRA) 180 MG tablet Take 180 mg by mouth daily with breakfast.    fluticasone  (FLONASE ) 50 MCG/ACT nasal spray INSTILL 2 SPRAYS IN THE  NOSE AT BEDTIME   fluticasone  (FLONASE ) 50 MCG/ACT nasal spray Place 2 sprays into both nostrils daily.   furosemide  (LASIX ) 40 MG tablet Take 1 tablet (40 mg total) by mouth daily.   glucose blood test strip Check blood sugar 2 (two) times daily.   Lancets Misc. (ACCU-CHEK FASTCLIX LANCET) KIT USE AS DIRECTED TO CHECK BLOOD SUGAR TWICE DAILY   Multiple  Vitamin (MULTIVITAMIN WITH MINERALS) TABS tablet Take 1 tablet by mouth daily with breakfast.   nitroGLYCERIN  (NITROSTAT ) 0.4 MG SL tablet PLACE ONE TABLET UNDER THE TONGUE EVERY 5 MINUTES AS NEEDED FOR CHEST PAIN   oxyCODONE -acetaminophen  (PERCOCET) 10-325 MG tablet Take 1 tablet by mouth every 8 (eight) hours as needed for pain.   pantoprazole  (PROTONIX ) 40 MG tablet Take 1 tablet (40 mg total) by mouth 2 (two) times daily.   Polyvinyl Alcohol-Povidone (REFRESH OP) Place 1 drop into both eyes at bedtime as needed (dry eyes).   potassium chloride  (MICRO-K ) 10 MEQ CR capsule Take 1 capsule (10 mEq total) by mouth daily.   ranolazine  (RANEXA ) 500 MG 12 hr tablet Take 1 tablet (500 mg total) by mouth 2 (two) times daily.   Rimegepant Sulfate  75 MG TBDP Take 1 tablet (75 mg total) by mouth daily as needed for migraine.   rosuvastatin  (CRESTOR ) 40 MG tablet Take 1 tablet (40 mg total) by mouth daily.   telmisartan   (MICARDIS ) 40 MG tablet Take 1 tablet (40 mg total) by mouth daily.   tirzepatide  (MOUNJARO ) 5 MG/0.5ML Pen Inject 5 mg into the skin once a week.   No facility-administered encounter medications on file as of 05/07/2024.  :   Review of Systems:  Out of a complete 14 point review of systems, all are reviewed and negative with the exception of these symptoms as listed below:   Review of Systems  Objective:  Neurological Exam  Physical Exam Physical Examination:   Vitals:   05/07/24 1429  BP: (!) 146/88  Pulse: 86    General Examination: The patient is a very pleasant 73 y.o. male in no acute distress. He appears well-developed and well-nourished and well groomed.   HEENT: Normocephalic, atraumatic, pupils are equal, round and reactive to light, extraocular tracking is good without limitation to gaze excursion or nystagmus noted. No photophobia.  Corrective eye glasses in place. Hearing is grossly intact.  Face is symmetric with normal facial animation. Speech is clear without dysarthria. There is no hypophonia. There is no lip, neck/head, jaw or voice tremor. Neck is supple with full range of passive and active motion. There are no carotid bruits on auscultation.  Airway/Oropharynx exam reveals: mild mouth dryness, adequate dental hygiene and moderate airway crowding, due to small airway entry and redundant soft palate.  Tonsils absent.  Mallampati class II.  Neck circumference 14 and three-quarter inches, mild overbite noted.  Tongue protrudes centrally and palate elevates symmetrically.  Chest: Clear to auscultation without wheezing, rhonchi or crackles noted.  Heart: S1+S2+0, regular and normal without murmurs, rubs or gallops noted.   Abdomen: Soft, non-tender and non-distended.  Extremities: There is no obvious swelling in the distal lower extremities bilaterally.   Skin: Warm and dry without trophic changes noted.   Musculoskeletal: exam reveals no obvious joint  deformities.   Neurologically:  Mental status: The patient is awake, alert and oriented in all 4 spheres. His immediate and remote memory, attention, language skills and fund of knowledge are appropriate. There is no evidence of aphasia, agnosia, apraxia or anomia. Speech is clear with normal prosody and enunciation. Thought process is linear. Mood is normal and affect is normal.  Cranial nerves II - XII are as described above under HEENT exam.  Motor exam: Normal bulk, moving all 4 extremities without restriction, no obvious action or resting tremor.  Fine motor skills and coordination: Intact grossly.  Cerebellar testing: No dysmetria or intention tremor. There is no truncal  or gait ataxia.  Sensory exam: intact to light touch in the upper and lower extremities.  Gait, station and balance: He stands easily. No veering to one side is noted. No leaning to one side is noted. Posture is age-appropriate and stance is narrow based. Gait shows normal stride length and normal pace. No problems turning are noted.   Assessment and Plan:  In summary, Lee [name] is a very pleasant 73 year old male with an underlying complex medical history of coronary artery disease with status post stenting, allergic rhinitis, arthritis, COPD, depression, diverticulosis, reflux disease, hearing loss, paroxysmal A-fib with status post ablation, PVCs, migraine headaches (previously followed by Dr. Ines in this clinic and now sees Duwaine Russell, NP, prior to that saw [facility] in the past, different providers), overweight state, status post multiple surgeries including arthroscopic surgeries and orthopedic surgeries as well as radiofrequency cardiac ablation and pacemaker placement, who presents for evaluation of his obstructive sleep apnea of at least 11 years duration.  He is struggling to be compliant with his CPAP of approximately 10 cm currently.  He was originally on a higher pressure and could not tolerate  that and given his significant amount of weight loss over time he would benefit from reevaluation.  We talked at length today.  We talked about alternative treatment options to PAP therapy.  He should be eligible for a new machine.  He would be willing to try an AutoPap machine.  He is not keen on pursuing inspire but he may be a candidate if he has moderate obstructive sleep apnea and continues to struggle with PAP therapy but since he has had multiple surgeries, I would be cautious with recommending any surgical treatment for him.  Nevertheless, reevaluation is warranted.  We will plan a follow-up after testing.  He is encouraged to continue to maintain a healthy lifestyle, try to hydrate better with water, avoid diet sodas is much as possible and limit her his caffeine  as he is.  I answered all their questions today and he and his wife were in agreement.    Thank you very much for allowing me to participate in the care of this nice patient. If I can be of any further assistance to you please do not hesitate to call me at [phone].  Sincerely,   True Mar, MD, PhD

## 2024-05-08 ENCOUNTER — Other Ambulatory Visit (HOSPITAL_COMMUNITY): Payer: Self-pay

## 2024-05-08 MED ORDER — PANTOPRAZOLE SODIUM 40 MG PO TBEC
40.0000 mg | DELAYED_RELEASE_TABLET | Freq: Two times a day (BID) | ORAL | 0 refills | Status: AC
  Filled 2024-05-08: qty 180, 90d supply, fill #0

## 2024-05-08 NOTE — Telephone Encounter (Signed)
 Requested Prescriptions  Pending Prescriptions Disp Refills   pantoprazole  (PROTONIX ) 40 MG tablet 180 tablet 0    Sig: Take 1 tablet (40 mg total) by mouth 2 (two) times daily.     Gastroenterology: Proton Pump Inhibitors Passed - 05/08/2024  3:28 PM      Passed - Valid encounter within last 12 months    Recent Outpatient Visits           3 weeks ago Rhinosinusitis   [place] [facility] Pickard, Butler [name], MD   4 weeks ago Essential hypertension   [place] [facility] Duanne Butler [name], MD   2 months ago Type 2 diabetes mellitus treated without insulin  [facility])   [place] [facility] Duanne Butler [name], MD   6 months ago Dysfunction of both eustachian tubes   [place] [facility] Duanne Butler [name], MD   8 months ago Angina pectoris   [place] [facility] Pickard, Butler [name], MD               oxyCODONE -acetaminophen  (PERCOCET) 10-325 MG tablet 21 tablet 0    Sig: Take 1 tablet by mouth every 8 (eight) hours as needed for pain.     Not Delegated - Analgesics:  Opioid Agonist Combinations Failed - 05/08/2024  3:28 PM      Failed - This refill cannot be delegated      Failed - Urine Drug Screen completed in last 360 days      Passed - Valid encounter within last 3 months    Recent Outpatient Visits           3 weeks ago Rhinosinusitis   [place] [facility] Pickard, Butler [name], MD   4 weeks ago Essential hypertension   [place] [facility] Duanne Butler [name], MD   2 months ago Type 2 diabetes mellitus treated without insulin  [facility])   [place] [facility] Duanne Butler [name], MD   6 months ago Dysfunction of both eustachian tubes   [place] [facility] Duanne Butler [name], MD   8 months ago Angina pectoris   [place] [facility] Pickard, Butler [name], MD

## 2024-05-08 NOTE — Telephone Encounter (Signed)
 Requested medication (s) are due for refill today: yes  Requested medication (s) are on the active medication list: yes  Last refill:  03/23/24  Future visit scheduled: no  Notes to clinic:  Unable to refill per protocol, cannot delegate.      Requested Prescriptions  Pending Prescriptions Disp Refills   oxyCODONE -acetaminophen  (PERCOCET) 10-325 MG tablet 21 tablet 0    Sig: Take 1 tablet by mouth every 8 (eight) hours as needed for pain.     Not Delegated - Analgesics:  Opioid Agonist Combinations Failed - 05/08/2024  3:29 PM      Failed - This refill cannot be delegated      Failed - Urine Drug Screen completed in last 360 days      Passed - Valid encounter within last 3 months    Recent Outpatient Visits           3 weeks ago Rhinosinusitis   [place] [facility] Pickard, Butler [name], MD   4 weeks ago Essential hypertension   [place] [facility] Duanne Butler [name], MD   2 months ago Type 2 diabetes mellitus treated without insulin  [facility])   [place] [facility] Duanne Butler [name], MD   6 months ago Dysfunction of both eustachian tubes   [place] [facility] Duanne Butler [name], MD   8 months ago Angina pectoris   [place] [facility] Duanne Butler [name], MD              Signed Prescriptions Disp Refills   pantoprazole  (PROTONIX ) 40 MG tablet 180 tablet 0    Sig: Take 1 tablet (40 mg total) by mouth 2 (two) times daily.     Gastroenterology: Proton Pump Inhibitors Passed - 05/08/2024  3:29 PM      Passed - Valid encounter within last 12 months    Recent Outpatient Visits           3 weeks ago Rhinosinusitis   [place] [facility] Pickard, Butler [name], MD   4 weeks ago Essential hypertension   [place] [facility] Duanne Butler [name], MD   2 months ago Type 2 diabetes mellitus treated without insulin  [facility])   [place]  [facility] Duanne Butler [name], MD   6 months ago Dysfunction of both eustachian tubes   [place] [facility] Duanne Butler [name], MD   8 months ago Angina pectoris    [facility] Pickard, Butler [name], MD

## 2024-05-09 ENCOUNTER — Other Ambulatory Visit (HOSPITAL_COMMUNITY): Payer: Self-pay

## 2024-05-09 ENCOUNTER — Encounter: Payer: Self-pay | Admitting: Audiology

## 2024-05-09 MED ORDER — OXYCODONE-ACETAMINOPHEN 10-325 MG PO TABS
1.0000 | ORAL_TABLET | Freq: Three times a day (TID) | ORAL | 0 refills | Status: AC | PRN
  Filled 2024-05-09: qty 21, 7d supply, fill #0

## 2024-05-11 ENCOUNTER — Encounter

## 2024-05-11 DIAGNOSIS — G4733 Obstructive sleep apnea (adult) (pediatric): Secondary | ICD-10-CM

## 2024-05-11 DIAGNOSIS — Z789 Other specified health status: Secondary | ICD-10-CM

## 2024-05-11 DIAGNOSIS — Z8679 Personal history of other diseases of the circulatory system: Secondary | ICD-10-CM

## 2024-05-11 DIAGNOSIS — R519 Headache, unspecified: Secondary | ICD-10-CM

## 2024-05-11 DIAGNOSIS — I2510 Atherosclerotic heart disease of native coronary artery without angina pectoris: Secondary | ICD-10-CM

## 2024-05-11 DIAGNOSIS — R634 Abnormal weight loss: Secondary | ICD-10-CM

## 2024-05-11 DIAGNOSIS — Z950 Presence of cardiac pacemaker: Secondary | ICD-10-CM

## 2024-05-11 DIAGNOSIS — R351 Nocturia: Secondary | ICD-10-CM

## 2024-05-25 ENCOUNTER — Ambulatory Visit (INDEPENDENT_AMBULATORY_CARE_PROVIDER_SITE_OTHER)

## 2024-05-25 DIAGNOSIS — I442 Atrioventricular block, complete: Secondary | ICD-10-CM

## 2024-05-26 LAB — CUP PACEART REMOTE DEVICE CHECK
Battery Remaining Longevity: 34 mo
Battery Remaining Percentage: 45 %
Battery Voltage: 2.95 V
Brady Statistic AP VP Percent: 1 %
Brady Statistic AP VS Percent: 1 %
Brady Statistic AS VP Percent: 98 %
Brady Statistic AS VS Percent: 1.5 %
Brady Statistic RA Percent Paced: 1 %
Brady Statistic RV Percent Paced: 99 %
Date Time Interrogation Session: 20251226021427
Implantable Lead Connection Status: 753985
Implantable Lead Connection Status: 753985
Implantable Lead Implant Date: 20201201
Implantable Lead Implant Date: 20201201
Implantable Lead Location: 753859
Implantable Lead Location: 753860
Implantable Pulse Generator Implant Date: 20201201
Lead Channel Impedance Value: 430 Ohm
Lead Channel Impedance Value: 460 Ohm
Lead Channel Pacing Threshold Amplitude: 0.75 V
Lead Channel Pacing Threshold Amplitude: 1.5 V
Lead Channel Pacing Threshold Pulse Width: 0.5 ms
Lead Channel Pacing Threshold Pulse Width: 0.5 ms
Lead Channel Sensing Intrinsic Amplitude: 2.9 mV
Lead Channel Sensing Intrinsic Amplitude: 3.1 mV
Lead Channel Setting Pacing Amplitude: 2 V
Lead Channel Setting Pacing Amplitude: 3 V
Lead Channel Setting Pacing Pulse Width: 0.5 ms
Lead Channel Setting Sensing Sensitivity: 0.5 mV
Pulse Gen Model: 2272
Pulse Gen Serial Number: 9182765

## 2024-05-28 ENCOUNTER — Ambulatory Visit: Payer: Self-pay | Admitting: Cardiology

## 2024-05-28 NOTE — Progress Notes (Signed)
 Remote PPM Transmission

## 2024-06-07 ENCOUNTER — Other Ambulatory Visit (HOSPITAL_COMMUNITY): Payer: Self-pay

## 2024-06-07 ENCOUNTER — Other Ambulatory Visit: Payer: Self-pay | Admitting: Family Medicine

## 2024-06-07 MED ORDER — FLUTICASONE PROPIONATE 50 MCG/ACT NA SUSP
2.0000 | Freq: Every day | NASAL | 6 refills | Status: AC
  Filled 2024-06-07: qty 16, 30d supply, fill #0
  Filled 2024-07-04: qty 16, 30d supply, fill #1

## 2024-06-12 LAB — OPHTHALMOLOGY REPORT-SCANNED

## 2024-06-14 ENCOUNTER — Other Ambulatory Visit: Payer: Self-pay | Admitting: Adult Health

## 2024-06-18 ENCOUNTER — Telehealth: Payer: Self-pay | Admitting: Adult Health

## 2024-06-18 ENCOUNTER — Other Ambulatory Visit: Payer: Self-pay

## 2024-06-18 ENCOUNTER — Other Ambulatory Visit (HOSPITAL_COMMUNITY): Payer: Self-pay

## 2024-06-20 ENCOUNTER — Other Ambulatory Visit (HOSPITAL_COMMUNITY): Payer: Self-pay

## 2024-06-20 NOTE — Telephone Encounter (Signed)
 Patient called to check on refill request. Patient said is completely out of medication, Qulipta 

## 2024-06-21 ENCOUNTER — Other Ambulatory Visit (HOSPITAL_COMMUNITY): Payer: Self-pay

## 2024-06-21 ENCOUNTER — Telehealth: Payer: Self-pay

## 2024-06-21 MED ORDER — QULIPTA 60 MG PO TABS
60.0000 mg | ORAL_TABLET | Freq: Every day | ORAL | 1 refills | Status: AC
  Filled 2024-06-21: qty 90, 90d supply, fill #0

## 2024-06-21 NOTE — Telephone Encounter (Signed)
PSG order placed.

## 2024-06-21 NOTE — Addendum Note (Signed)
 Addended by: SHONA SAVANT A on: 06/21/2024 08:14 AM   Modules accepted: Orders

## 2024-06-21 NOTE — Addendum Note (Signed)
 Addended by: Velma Agnes on: 06/21/2024 04:30 PM   Modules accepted: Orders

## 2024-06-21 NOTE — Telephone Encounter (Signed)
 Patient has attempted 2 HST's (and failed both). Message received from [facility]:  Both studies inconclusive due to poor device contact and being almost completely HR/SPO2 excluded (attempted on 12/15 and 1/12)  Would you like for patient to complete an inlab sleep study now? Please place a new order if so. Thank you!

## 2024-06-25 ENCOUNTER — Encounter

## 2024-06-26 ENCOUNTER — Telehealth: Payer: Self-pay | Admitting: Neurology

## 2024-06-26 ENCOUNTER — Ambulatory Visit (INDEPENDENT_AMBULATORY_CARE_PROVIDER_SITE_OTHER): Admitting: Otolaryngology

## 2024-06-26 NOTE — Telephone Encounter (Signed)
 Noted,  2 fail attempts with HST needs in lab [facility] medicare no auth req

## 2024-06-26 NOTE — Telephone Encounter (Signed)
 [employer] medicare no auth req 2 failed attempts to the HST.  Patient is scheduled at [facility] for 07/01/24 at 9 pm.  Sent mychart

## 2024-07-01 ENCOUNTER — Encounter

## 2024-07-03 ENCOUNTER — Ambulatory Visit (INDEPENDENT_AMBULATORY_CARE_PROVIDER_SITE_OTHER): Admitting: Otolaryngology

## 2024-07-04 ENCOUNTER — Encounter (INDEPENDENT_AMBULATORY_CARE_PROVIDER_SITE_OTHER): Payer: Self-pay | Admitting: Otolaryngology

## 2024-07-04 ENCOUNTER — Ambulatory Visit (INDEPENDENT_AMBULATORY_CARE_PROVIDER_SITE_OTHER): Admitting: Otolaryngology

## 2024-07-04 ENCOUNTER — Other Ambulatory Visit: Payer: Self-pay

## 2024-07-04 VITALS — BP 136/88 | HR 86 | Temp 97.6°F

## 2024-07-04 DIAGNOSIS — H938X1 Other specified disorders of right ear: Secondary | ICD-10-CM | POA: Diagnosis not present

## 2024-07-04 DIAGNOSIS — Z789 Other specified health status: Secondary | ICD-10-CM

## 2024-07-04 DIAGNOSIS — G4733 Obstructive sleep apnea (adult) (pediatric): Secondary | ICD-10-CM | POA: Diagnosis not present

## 2024-07-04 DIAGNOSIS — H903 Sensorineural hearing loss, bilateral: Secondary | ICD-10-CM

## 2024-07-04 DIAGNOSIS — H6991 Unspecified Eustachian tube disorder, right ear: Secondary | ICD-10-CM

## 2024-07-04 DIAGNOSIS — Z91198 Patient's noncompliance with other medical treatment and regimen for other reason: Secondary | ICD-10-CM | POA: Diagnosis not present

## 2024-07-04 NOTE — Progress Notes (Signed)
 Dear Dr. Duanne, Here is my assessment for our mutual patient, Lee [name]. Thank you for allowing me the opportunity to care for your patient. Please do not hesitate to contact me should you have any other questions. Sincerely, Dr. Eldora Blanch  Otolaryngology Clinic Note Referring provider: Dr. Duanne HPI:  Lee [name] is a 74 y.o. male kindly referred by Dr. Duanne for evaluation of ear fullness  Initial visit (12/2023): Patient reports: reports R>L ear fullness, ongoing for several months (maybe about a year). Comes and goes - only happens when he is outside working but now comes on more regularly. Takes about 45 mins - 1 hour and then it gets back to normal. No associated symptoms in ear including tinnitus, frank vertigo, worsening hearing loss. He does report that he has major allergy issues and has had septo/turbs with Dr. Mable --- does have typical AR symptoms. He tried flonase  but did not help. No antecedent event including URI Patient denies: ear pain, fullness, vertigo, drainage, tinnitus Patient also denies barotrauma, vestibular suppressant use, ototoxic medication use Prior ear surgery: no  Does use HA  Of note, he also uses CPAP and has OSA and is interested in Gail device. However, last sleep test was several years ago and he has lost over 50lbs since.  --------------------------------------------------------- 04/24/2024 Continues to have fullness R>L ONLY with exertion; takes about 15-20 minutes and then goes away; no other symptoms; does have typical AR symptoms; no other ear symptoms currently. Steroids did not help. Still using nasal sprays. Has HA. We did discuss his audiogram. He is quite frustrated with his ear symptoms.  Has not gotten HST yet but scheduled for f/u with Sleep in Dec --------------------------------------------------------- 07/04/2024 Patient reports that the myringotomy did help with his symptoms. He reports that he did get hot  after the myringotomy but did not have any ear symptoms. He is scheduled for upcoming sleep study. He is not having issues with ear drainage or pain.  Personal or FHx of bleeding dz or anesthesia difficulty: no  AP/AC: ASA 81  Tobacco: former, quit  ENT Surgery: septo/turbs  PMHx: CAD s/p stents, AV block, Aortic stenosis, HTN, HLD, OSA, T2DM, Migraines  Independent Review of Additional Tests or Records:  Dr. Duanne (11/07/2023): noted both ears feel stopped up, left > right, for months; no tinnitus or hearing loss or vertigo; Dx: ETD; Rx: flonase  BID, ref to ENT if not improving CBC and CMP 06/22/2023: WBC 7.1, Eos 178; BUN/Cr 11/0.72 CTH 05/03/2020 independently interpreted with respect to sinuses/nasal cavity and ears: cannot visualize max entirely but visualized paranasal sinuses clear; cuts thick but mastoids and ME well aerated and unable to appreciate any otic capsule or ossicular chain abnormalities 03/2024 Audiogram was independently reviewed and interpreted by me and it reveals - A/A tymps; normal, downslopin to mod/sev SNHL AU with WRT 88/80% AD/AS  SNHL= Sensorineural hearing los   PMH/Meds/All/SocHx/FamHx/ROS:   Past Medical History:  Diagnosis Date   Allergic rhinitis    chronic sinusitis   Allergy    Arrhythmia    Arthritis    osetoarthritis   CAD (coronary artery disease)    a. 2009 PCI/DES to mLAD; b. 05/2010 s/p DES RCA and LAD.; c. 05/2013 Cath: LAD mild ISR, LCX nl, RCA 40p, patent stents.   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   Cataract    has had lasik surg   COPD (chronic obstructive pulmonary disease) (HCC)    Depression  Diabetes mellitus type 2, controlled, without complications (HCC)    Diabetes mellitus without complication (HCC)    Phreesia 08/22/2020   Diverticulosis    DJD (degenerative joint disease)    Esophagitis 1991   grade 1   GERD (gastroesophageal reflux disease)    Hiatal hernia   Hearing loss    wears  bilateral hearing aids   Heart murmur    Dr Duanne dx per patient   Hemorrhoids    Hyperlipidemia    Hypertension    Hypertensive heart disease    Iron deficiency anemia    Migraines    Myocardial infarction [facility])    Paroxysmal atrial fibrillation (HCC)    a. s/p afib ablation 10/22/08 (J. Allred).   Presence of permanent cardiac pacemaker    PVC's (premature ventricular contractions)    Sleep apnea    Uses nightly.  Questionalble: RDI during the total sleep time 6h 28 mins was 3.55/hr during REM sleep was at 5.71/hr. Supine AHI was 5.93/hr.   Special screening for malignant neoplasms, colon 10/01/2022   Syncope 08/2018     Past Surgical History:  Procedure Laterality Date   ABDOMINAL HYSTERECTOMY     CARDIAC CATHETERIZATION  05/31/2010   The proximal LAD was then predilated with 2.0 x 12 trek. this was then stented with a 2.5 x 16 promus Element drug-eluting stent at 14 atmosphere and prostdilated with 2.75 x 12 Hand[place] trek at 16 atmospheres(2.8 mm) resulting the reduction of 80% proximal LAD stenosis to 0% residual with excellent flow.   CARDIAC CATHETERIZATION  06/05/2010   Successful percutaneous coronary intervention to the right coronary artery with percutaneous transluminal coronary angioplasty/stenting and insertion 3.0 x 16 mm Promus DES post dilated to 3.35 mm with stenosis being reduced to 0%   CARDIAC CATHETERIZATION  02/29/2008   Post dilatation was performed using a 2.75 x 9 [place] sprinter, 10 atmospheres for 40 seconds and then 9 atmosphere for 35 sec. This resulted in the 80% area of narrowing pre-intervention, now appearing to normal. There was no edvidence of the dissection or thrombus and there was TIMI III flow pre and post.   CARDIAC CATHETERIZATION  02/28/2006   COLONOSCOPY WITH PROPOFOL  N/A 10/01/2022   Procedure: COLONOSCOPY WITH PROPOFOL ;  Surgeon: Eartha Angelia Sieving, MD;  Location: [facility];  Service: Gastroenterology;  Laterality: N/A;  8:00am;asa 3    CORONARY ANGIOPLASTY WITH STENT PLACEMENT     3 stents/ 4 caths   DISTAL BICEPS TENDON REPAIR Left 02/12/2022   Procedure: LEFT DISTAL BICEPS TENDON REPAIR;  Surgeon: Jerri Kay HERO, MD;  Location: [facility] OR;  Service: Orthopedics;  Laterality: Left;   DISTAL BICEPS TENDON REPAIR Left 03/03/2022   Procedure: LEFT DISTAL BICEPS TENDON REPAIR;  Surgeon: Jerri Kay HERO, MD;  Location: [facility] OR;  Service: Orthopedics;  Laterality: Left;   EYE SURGERY     FINGER ARTHROPLASTY Right 09/02/2021   Procedure: RIGHT THUMB CARPOMETACARPAL ARTHROPLASTY;  Surgeon: Jerri Kay HERO, MD;  Location: [facility];  Service: Orthopedics;  Laterality: Right;  block in preop   INSERT / REPLACE / REMOVE PACEMAKER  05/01/2019   KNEE ARTHROSCOPY     right   LASIK     LEFT HEART CATHETERIZATION WITH CORONARY ANGIOGRAM N/A 06/29/2013   Procedure: LEFT HEART CATHETERIZATION WITH CORONARY ANGIOGRAM;  Surgeon: Alm LELON Clay, MD;  Location: [facility] CATH LAB;  Service: Cardiovascular;  Laterality: N/A;   NASAL SINUS SURGERY  06/01/1999   NECK SURGERY  Cervical fusion   PACEMAKER IMPLANT N/A 05/01/2019   Procedure: PACEMAKER IMPLANT;  Surgeon: Kelsie Agent, MD;  Location: [facility] INVASIVE CV LAB;  Service: Cardiovascular;  Laterality: N/A;   RADIOFREQUENCY ABLATION  09/28/2008   atrial fibrillation   ROTATOR CUFF REPAIR     left   SHOULDER OPEN ROTATOR CUFF REPAIR     right   SPINE SURGERY     WRIST ARTHROCENTESIS     left    Family History  Problem Relation Age of Onset   Colon cancer Mother        mets from uterine   Uterine cancer Mother    Cancer Mother    Heart disease Father        and sister   Diabetes Father    Hearing loss Father    Stroke Father    Hypertension Father    Heart attack Father    Heart disease Sister    Lung cancer Sister    Stroke Brother    Hypertension Brother    Heart attack Brother    Sleep apnea Daughter    Migraines Neg Hx      Social Connections: Moderately  Integrated (04/09/2024)   Social Connection and Isolation Panel    Frequency of Communication with Friends and Family: More than three times a week    Frequency of Social Gatherings with Friends and Family: Once a week    Attends Religious Services: More than 4 times per year    Active Member of [employer] or Organizations: No    Attends Engineer, Structural: Not on file    Marital Status: Married      Current Outpatient Medications:    Accu-Chek FastClix Lancets MISC, Check blood sugar 2 (two) times daily as directed, Disp: 102 each, Rfl: 5   acetaminophen  (TYLENOL ) 325 MG tablet, Take 1-2 tablets (325-650 mg total) by mouth every 4 (four) hours as needed for mild pain., Disp:  , Rfl:    amLODipine  (NORVASC ) 5 MG tablet, Take 5 mg by mouth daily., Disp: , Rfl:    amLODipine  (NORVASC ) 5 MG tablet, Take 1 tablet (5 mg total) by mouth daily., Disp: 90 tablet, Rfl: 3   amoxicillin -clavulanate (AUGMENTIN ) 875-125 MG tablet, Take 1 tablet by mouth 2 (two) times daily., Disp: 20 tablet, Rfl: 0   aspirin  EC 81 MG tablet, Take 81 mg by mouth at bedtime., Disp: , Rfl:    Atogepant  (QULIPTA ) 60 MG TABS, Take 1 tablet (60 mg total) by mouth daily., Disp: 90 tablet, Rfl: 1   Blood Glucose Monitoring Suppl (ACCU-CHEK AVIVA PLUS) w/Device KIT, Check BS bid E11.9, Disp: 1 kit, Rfl: 1   cholecalciferol  (VITAMIN D ) 1000 UNITS tablet, Take 1,000 Units by mouth at bedtime. , Disp: , Rfl:    citalopram  (CELEXA ) 40 MG tablet, Take 1 tablet (40 mg total) by mouth daily., Disp: 90 tablet, Rfl: 1   Coenzyme Q10 (CO Q 10) 100 MG CAPS, Take 100 mg by mouth daily with breakfast. , Disp: , Rfl:    empagliflozin  (JARDIANCE ) 25 MG TABS tablet, Take 1 tablet (25 mg total) by mouth daily before breakfast., Disp: 90 tablet, Rfl: 3   fexofenadine (ALLEGRA) 180 MG tablet, Take 180 mg by mouth daily with breakfast. , Disp: , Rfl:    fluticasone  (FLONASE ) 50 MCG/ACT nasal spray, INSTILL 2 SPRAYS IN THE  NOSE AT BEDTIME,  Disp: 48 g, Rfl: 3   fluticasone  (FLONASE ) 50 MCG/ACT nasal spray, Place 2 sprays into  both nostrils daily., Disp: 16 g, Rfl: 6   furosemide  (LASIX ) 40 MG tablet, Take 1 tablet (40 mg total) by mouth daily., Disp: 90 tablet, Rfl: 3   glucose blood test strip, Check blood sugar 2 (two) times daily., Disp: 100 each, Rfl: 12   Lancets Misc. (ACCU-CHEK FASTCLIX LANCET) KIT, USE AS DIRECTED TO CHECK BLOOD SUGAR TWICE DAILY, Disp: 1 kit, Rfl: 1   Multiple Vitamin (MULTIVITAMIN WITH MINERALS) TABS tablet, Take 1 tablet by mouth daily with breakfast., Disp: , Rfl:    nitroGLYCERIN  (NITROSTAT ) 0.4 MG SL tablet, PLACE ONE TABLET UNDER THE TONGUE EVERY 5 MINUTES AS NEEDED FOR CHEST PAIN, Disp: 25 tablet, Rfl: 3   oxyCODONE -acetaminophen  (PERCOCET) 10-325 MG tablet, Take 1 tablet by mouth every 8 (eight) hours as needed for pain., Disp: 21 tablet, Rfl: 0   pantoprazole  (PROTONIX ) 40 MG tablet, Take 1 tablet (40 mg total) by mouth 2 (two) times daily., Disp: 180 tablet, Rfl: 0   Polyvinyl Alcohol-Povidone (REFRESH OP), Place 1 drop into both eyes at bedtime as needed (dry eyes)., Disp: , Rfl:    potassium chloride  (MICRO-K ) 10 MEQ CR capsule, Take 1 capsule (10 mEq total) by mouth daily., Disp: 90 capsule, Rfl: 3   ranolazine  (RANEXA ) 500 MG 12 hr tablet, Take 1 tablet (500 mg total) by mouth 2 (two) times daily., Disp: 180 tablet, Rfl: 3   Rimegepant Sulfate  75 MG TBDP, Take 1 tablet (75 mg total) by mouth daily as needed for migraine., Disp: 16 tablet, Rfl: 11   rosuvastatin  (CRESTOR ) 40 MG tablet, Take 1 tablet (40 mg total) by mouth daily., Disp: 90 tablet, Rfl: 2   telmisartan  (MICARDIS ) 40 MG tablet, Take 1 tablet (40 mg total) by mouth daily., Disp: 90 tablet, Rfl: 2   tirzepatide  (MOUNJARO ) 5 MG/0.5ML Pen, Inject 5 mg into the skin once a week., Disp: 6 mL, Rfl: 3   Physical Exam:   BP 136/88   Pulse 86   Temp 97.6 F (36.4 C)   SpO2 96%   Salient findings:  CN II-XII intact Given history and  complaints, ear microscopy was indicated and performed for evaluation with findings as below in physical exam section and in procedures; Bilateral EAC clear and TM intact with well pneumatized middle ear space still, TM appears to have healed, small crust overlying Weber 512: mid Rinne 512: AC > BC b/l  Anterior rhinoscopy: Septum intact; bilateral inferior turbinates without significant hypertrophy No lesions of oral cavity/oropharynx; tongue friedman 2 No obviously palpable neck masses/lymphadenopathy/thyromegaly No respiratory distress or stridor  Seprately Identifiable Procedures:  Prior to initiating any procedures, risks/benefits/alternatives were explained to the patient and verbal consent obtained. Procedure: Bilateral ear microscopy using microscope (CPT [id number]) Pre-procedure diagnosis: right ear fullness Post-procedure diagnosis: same Indication: see above; given patient's otologic complaints and history, for improved and comprehensive examination of external ear and tympanic membrane, bilateral otologic examination using microscope was performed. Prior to proceeding, verbal consent was obtained after discussion of R/B/A  Procedure: Patient was placed semi-recumbent. Both ear canals were examined using the microscope with findings above. Patient tolerated the procedure well.   Patient tolerated the procedure well  Impression & Plans:  Lee [name] is a 74 y.o. male with  1. Sensorineural hearing loss, bilateral   2. Intolerance of continuous positive airway pressure (CPAP) ventilation   3. OSA (obstructive sleep apnea)   4. Dysfunction of right eustachian tube   5. Sensation of fullness in right ear    Ear  sx: Transient, lasts about 30-60 mins. Has tried flonase  and pred without benefit. We discussed DDX and options - could just be referred sensation; but given very focal sx, we discussed and I offered diagnostic myringotomy which we did. He reports it helped significantly  and essentially resolved his symptoms. As such, did offer tymp tube (did discuss possible hearing change and lack of improvement despite prior benefit with myringotomy). He would like to wait and see how he does this spring (sx more frequent in hot weather).  SNHL: d/w pt amplification but he opted to observe  OSA: will need updated sleep test;scheduled; will recheck in Summer --- he is interested in inspire but will need updated test  - f/u July 2026  See below regarding exact medications prescribed this encounter including dosages and route: No orders of the defined types were placed in this encounter.     Thank you for allowing me the opportunity to care for your patient. Please do not hesitate to contact me should you have any other questions.  Sincerely, Eldora Blanch, MD Otolaryngologist (ENT), [facility] Phone: [phone] Fax: [phone]  07/04/2024, 9:43 AM   MDM:  [phone] Complexity/Problems addressed: mod - multiple chronic issues Data complexity: low - Morbidity: low  - Prescription Drug prescribed or managed: n

## 2024-07-24 ENCOUNTER — Encounter

## 2024-08-24 ENCOUNTER — Encounter

## 2024-09-24 ENCOUNTER — Encounter

## 2024-10-30 ENCOUNTER — Ambulatory Visit: Admitting: Adult Health

## 2024-11-01 ENCOUNTER — Encounter

## 2024-11-12 ENCOUNTER — Ambulatory Visit: Admitting: Adult Health

## 2024-11-23 ENCOUNTER — Encounter

## 2024-12-19 ENCOUNTER — Ambulatory Visit (INDEPENDENT_AMBULATORY_CARE_PROVIDER_SITE_OTHER): Admitting: Otolaryngology

## 2024-12-24 ENCOUNTER — Encounter

## 2025-02-22 ENCOUNTER — Encounter
